# Patient Record
Sex: Male | Born: 1945 | Race: Black or African American | Hispanic: No | Marital: Married | Smoking: Former smoker
Health system: Southern US, Community
[De-identification: ages and names within clinical notes are randomized; demographics above are authoritative.]

## PROBLEM LIST (undated history)

## (undated) DIAGNOSIS — I10 Essential (primary) hypertension: Secondary | ICD-10-CM

## (undated) DIAGNOSIS — B1920 Unspecified viral hepatitis C without hepatic coma: Secondary | ICD-10-CM

## (undated) DIAGNOSIS — H409 Unspecified glaucoma: Secondary | ICD-10-CM

## (undated) DIAGNOSIS — H269 Unspecified cataract: Secondary | ICD-10-CM

## (undated) DIAGNOSIS — I639 Cerebral infarction, unspecified: Secondary | ICD-10-CM

## (undated) HISTORY — PX: EYE SURGERY: SHX253

## (undated) HISTORY — DX: Unspecified viral hepatitis C without hepatic coma: B19.20

## (undated) HISTORY — DX: Cerebral infarction, unspecified: I63.9

## (undated) HISTORY — DX: Essential (primary) hypertension: I10

## (undated) HISTORY — PX: CATARACT EXTRACTION, BILATERAL: SHX1313

## (undated) HISTORY — DX: Unspecified cataract: H26.9

---

## 2008-05-06 LAB — HM COLONOSCOPY: HM COLON: NORMAL

## 2011-07-30 ENCOUNTER — Other Ambulatory Visit (HOSPITAL_COMMUNITY): Payer: Self-pay | Admitting: Cardiology

## 2011-08-09 ENCOUNTER — Encounter (HOSPITAL_COMMUNITY): Payer: Self-pay

## 2011-08-09 ENCOUNTER — Ambulatory Visit (HOSPITAL_COMMUNITY)
Admission: RE | Admit: 2011-08-09 | Discharge: 2011-08-09 | Disposition: A | Discharge status: Home / Self Care | Payer: BC Managed Care – PPO | Source: Ambulatory Visit | Attending: Cardiology | Admitting: Cardiology

## 2011-08-09 ENCOUNTER — Encounter (HOSPITAL_COMMUNITY)
Admission: RE | Admit: 2011-08-09 | Discharge: 2011-08-09 | Disposition: A | Discharge status: Home / Self Care | Payer: BC Managed Care – PPO | Source: Ambulatory Visit | Attending: Cardiology | Admitting: Cardiology

## 2011-08-09 DIAGNOSIS — R079 Chest pain, unspecified: Secondary | ICD-10-CM | POA: Insufficient documentation

## 2011-08-09 DIAGNOSIS — R51 Headache: Secondary | ICD-10-CM | POA: Insufficient documentation

## 2011-08-09 DIAGNOSIS — I498 Other specified cardiac arrhythmias: Secondary | ICD-10-CM | POA: Insufficient documentation

## 2011-08-09 MED ORDER — REGADENOSON 0.4 MG/5ML IV SOLN
INTRAVENOUS | Status: AC
  Filled 2011-08-09: qty 5

## 2011-08-09 MED ORDER — TECHNETIUM TC 99M TETROFOSMIN IV KIT
30.0000 | PACK | Freq: Once | INTRAVENOUS | Status: AC | PRN
  Administered 2011-08-09: 30 via INTRAVENOUS

## 2011-08-09 MED ORDER — REGADENOSON 0.4 MG/5ML IV SOLN
0.4000 mg | Freq: Once | INTRAVENOUS | Status: AC
  Administered 2011-08-09: 0.4 mg via INTRAVENOUS

## 2011-08-09 MED ORDER — TECHNETIUM TC 99M TETROFOSMIN IV KIT: 10.0000 | PACK | Freq: Once | INTRAVENOUS | Status: DC | PRN

## 2011-08-09 MED ORDER — TECHNETIUM TC 99M TETROFOSMIN IV KIT
10.0000 | PACK | Freq: Once | INTRAVENOUS | Status: AC | PRN
  Administered 2011-08-09: 10 via INTRAVENOUS

## 2011-08-09 MED ORDER — TECHNETIUM TC 99M TETROFOSMIN IV KIT: 30.0000 | PACK | Freq: Once | INTRAVENOUS | Status: DC | PRN

## 2013-05-04 DIAGNOSIS — H4011X0 Primary open-angle glaucoma, stage unspecified: Secondary | ICD-10-CM | POA: Diagnosis not present

## 2013-05-04 DIAGNOSIS — H409 Unspecified glaucoma: Secondary | ICD-10-CM | POA: Diagnosis not present

## 2013-07-22 ENCOUNTER — Encounter: Payer: Self-pay | Admitting: Internal Medicine

## 2013-07-22 ENCOUNTER — Ambulatory Visit (INDEPENDENT_AMBULATORY_CARE_PROVIDER_SITE_OTHER): Payer: Medicare Other | Admitting: Internal Medicine

## 2013-07-22 VITALS — BP 130/74 | HR 71 | Temp 98.1°F | Ht 70.0 in | Wt 198.2 lb

## 2013-07-22 DIAGNOSIS — R5383 Other fatigue: Secondary | ICD-10-CM | POA: Diagnosis not present

## 2013-07-22 DIAGNOSIS — Z23 Encounter for immunization: Secondary | ICD-10-CM

## 2013-07-22 DIAGNOSIS — R6889 Other general symptoms and signs: Secondary | ICD-10-CM

## 2013-07-22 DIAGNOSIS — B182 Chronic viral hepatitis C: Secondary | ICD-10-CM | POA: Insufficient documentation

## 2013-07-22 DIAGNOSIS — R5381 Other malaise: Secondary | ICD-10-CM | POA: Diagnosis not present

## 2013-07-22 DIAGNOSIS — H9190 Unspecified hearing loss, unspecified ear: Secondary | ICD-10-CM | POA: Diagnosis not present

## 2013-07-22 DIAGNOSIS — B1920 Unspecified viral hepatitis C without hepatic coma: Secondary | ICD-10-CM | POA: Diagnosis not present

## 2013-07-22 LAB — CBC
HCT: 47.8 % (ref 39.0–52.0)
HEMOGLOBIN: 15.6 g/dL (ref 13.0–17.0)
MCHC: 32.8 g/dL (ref 30.0–36.0)
MCV: 93.7 fl (ref 78.0–100.0)
PLATELETS: 204 10*3/uL (ref 150.0–400.0)
RBC: 5.1 Mil/uL (ref 4.22–5.81)
RDW: 13.3 % (ref 11.5–14.6)
WBC: 2.9 10*3/uL — AB (ref 4.5–10.5)

## 2013-07-22 LAB — COMPREHENSIVE METABOLIC PANEL
ALT: 31 U/L (ref 0–53)
AST: 25 U/L (ref 0–37)
Albumin: 4.1 g/dL (ref 3.5–5.2)
Alkaline Phosphatase: 51 U/L (ref 39–117)
BILIRUBIN TOTAL: 0.8 mg/dL (ref 0.3–1.2)
BUN: 15 mg/dL (ref 6–23)
CALCIUM: 8.6 mg/dL (ref 8.4–10.5)
CHLORIDE: 103 meq/L (ref 96–112)
CO2: 25 meq/L (ref 19–32)
CREATININE: 1.1 mg/dL (ref 0.4–1.5)
GFR: 85.39 mL/min (ref >60.00)
Glucose, Bld: 88 mg/dL (ref 70–99)
Potassium: 3.9 mEq/L (ref 3.5–5.1)
Sodium: 137 mEq/L (ref 135–145)
Total Protein: 7 g/dL (ref 6.0–8.3)

## 2013-07-22 LAB — TSH: TSH: 0.73 u[IU]/mL (ref 0.35–5.50)

## 2013-07-22 NOTE — Patient Instructions (Addendum)

## 2013-07-22 NOTE — Progress Notes (Signed)
Pre visit review using our clinic review tool, if applicable. No additional management support is needed unless otherwise documented below in the visit note. 

## 2013-07-22 NOTE — Addendum Note (Signed)
Addended by: Roena MaladyEVONTENNO, Malesha Suliman Y on: 07/22/2013 04:51 PM   Modules accepted: Orders

## 2013-07-22 NOTE — Progress Notes (Signed)
HPI  Pt presents to the clinic today to establish care. He is transferring care from Dr. Larwance Sachs at [facility].  He does feel cold at times. He is not sure if it is a thyroid issue of anemia. He does feel a little fatigue but denies all other s/s of thyroid disease. He feels like he is having trouble hearing out of his ears. He is not sure if it is wax buildup or hearing loss. He has not had any trauma to the head.  Flu: never Tetanus: 2014 Pneumovax:  Zostovax: Colon Screening: 2010 PSA Screening: 2014 Eye Doctor: yearly Dentist: biannually  Past Medical History  Diagnosis Date  . Hepatitis C     Current Outpatient Prescriptions  Medication Sig Dispense Refill  . bimatoprost (LUMIGAN) 0.01 % SOLN Place 1 drop into both eyes at bedtime.       No current facility-administered medications for this visit.    No Known Allergies  Family History  Problem Relation Age of Onset  . Hypertension Mother   . Hypertension Father     History   Social History  . Marital Status: Married    Spouse Name: N/A    Number of Children: N/A  . Years of Education: N/A   Occupational History  . Not on file.   Social History Main Topics  . Smoking status: Former Games developer  . Smokeless tobacco: Former Neurosurgeon    Quit date: 05/06/1993  . Alcohol Use: No  . Drug Use: No  . Sexual Activity: Not on file   Other Topics Concern  . Not on file   Social History Narrative  . No narrative on file    ROS:  Constitutional: Pt reports fatigue. Denies fever, malaise,  headache or abrupt weight changes.  HEENT: Pt reports difficulty hearing. Denies eye pain, eye redness, ear pain, ringing in the ears, wax buildup, runny nose, nasal congestion, bloody nose, or sore throat. Respiratory: Denies difficulty breathing, shortness of breath, cough or sputum production.   Cardiovascular: Denies chest pain, chest tightness, palpitations or swelling in the hands or feet.  Gastrointestinal: Denies  abdominal pain, bloating, constipation, diarrhea or blood in the stool.  GU: Denies frequency, urgency, pain with urination, blood in urine, odor or discharge. Musculoskeletal: Denies decrease in range of motion, difficulty with gait, muscle pain or joint pain and swelling.  Skin: Denies redness, rashes, lesions or ulcercations.  Neurological: Denies dizziness, difficulty with memory, difficulty with speech or problems with balance and coordination.   No other specific complaints in a complete review of systems (except as listed in HPI above).  PE:  Ht 5\' 10"  (1.778 m)  Wt 198 lb 4 oz (89.926 kg)  BMI 28.45 kg/m2  SpO2 97% Wt Readings from Last 3 Encounters:  07/22/13 198 lb 4 oz (89.926 kg)    General: Appears his stated age, well developed, well nourished in NAD. HEENT: Head: normal shape and size; Eyes: sclera white, no icterus, conjunctiva pink, PERRLA and EOMs intact; Ears: Tm's gray and intact, normal light reflex; Nose: mucosa pink and moist, septum midline; Throat/Mouth: Teeth present, mucosa pink and moist, no lesions or ulcerations noted.  Neck: Normal range of motion. Neck supple, trachea midline. No massses, lumps present. Thyromegaly noted. Cardiovascular: Normal rate and rhythm. S1,S2 noted.  No murmur, rubs or gallops noted. No JVD or BLE edema. No carotid bruits noted. Pulmonary/Chest: Normal effort and positive vesicular breath sounds. No respiratory distress. No wheezes, rales or ronchi noted.  Abdomen: Soft and  nontender. Normal bowel sounds, no bruits noted. No distention or masses noted. Liver, spleen and kidneys non palpable. Musculoskeletal: Normal range of motion. No signs of joint swelling. No difficulty with gait.  Neurological: Alert and oriented. Cranial nerves II-XII intact. Coordination normal. +DTRs bilaterally. Psychiatric: Mood and affect normal. Behavior is normal. Judgment and thought content normal.      Assessment and Plan:  Cold feeling,  fatigue:  Will check basic labs today including TSH  Difficulty Hearing:  Not due to cerumen impaction If continues to bother you, will refer to Audiology  RTC in 1 year or sooner if needed

## 2013-07-22 NOTE — Assessment & Plan Note (Signed)
Will start treatment with GI in may

## 2013-07-23 ENCOUNTER — Other Ambulatory Visit: Payer: Self-pay | Admitting: Internal Medicine

## 2013-07-23 DIAGNOSIS — D709 Neutropenia, unspecified: Secondary | ICD-10-CM

## 2013-09-07 DIAGNOSIS — H409 Unspecified glaucoma: Secondary | ICD-10-CM | POA: Diagnosis not present

## 2013-09-07 DIAGNOSIS — H4011X0 Primary open-angle glaucoma, stage unspecified: Secondary | ICD-10-CM | POA: Diagnosis not present

## 2013-09-15 DIAGNOSIS — B1920 Unspecified viral hepatitis C without hepatic coma: Secondary | ICD-10-CM | POA: Diagnosis not present

## 2013-11-04 DIAGNOSIS — B182 Chronic viral hepatitis C: Secondary | ICD-10-CM | POA: Diagnosis not present

## 2013-12-30 DIAGNOSIS — B182 Chronic viral hepatitis C: Secondary | ICD-10-CM | POA: Diagnosis not present

## 2013-12-30 DIAGNOSIS — Q619 Cystic kidney disease, unspecified: Secondary | ICD-10-CM | POA: Diagnosis not present

## 2014-01-14 DIAGNOSIS — H4011X0 Primary open-angle glaucoma, stage unspecified: Secondary | ICD-10-CM | POA: Diagnosis not present

## 2014-01-14 DIAGNOSIS — H409 Unspecified glaucoma: Secondary | ICD-10-CM | POA: Diagnosis not present

## 2014-01-20 DIAGNOSIS — B182 Chronic viral hepatitis C: Secondary | ICD-10-CM | POA: Diagnosis not present

## 2014-02-16 ENCOUNTER — Other Ambulatory Visit: Payer: Self-pay | Admitting: Internal Medicine

## 2014-02-16 ENCOUNTER — Telehealth: Payer: Self-pay | Admitting: Internal Medicine

## 2014-02-16 MED ORDER — ZOSTER VACCINE LIVE 19400 UNT/0.65ML ~~LOC~~ SOLR: 0.6500 mL | Freq: Once | SUBCUTANEOUS | Status: DC

## 2014-02-16 NOTE — Telephone Encounter (Signed)
Refer to msg below--please advise

## 2014-02-16 NOTE — Telephone Encounter (Signed)
Pt is requesting prescription for the shingles vaccine. His insurance does not cover it through our office, he will need to take that prescription to the pharmacy. Please call pt when ready. Thanks

## 2014-02-16 NOTE — Telephone Encounter (Signed)
Rx left in front office for pick up and pt is aware  

## 2014-02-16 NOTE — Telephone Encounter (Signed)
RX printed and signed, given to Pulte Homesmelanie

## 2014-03-24 DIAGNOSIS — B182 Chronic viral hepatitis C: Secondary | ICD-10-CM | POA: Diagnosis not present

## 2014-03-24 DIAGNOSIS — Z79899 Other long term (current) drug therapy: Secondary | ICD-10-CM | POA: Diagnosis not present

## 2014-05-17 DIAGNOSIS — H4011X2 Primary open-angle glaucoma, moderate stage: Secondary | ICD-10-CM | POA: Diagnosis not present

## 2014-09-14 DIAGNOSIS — Z6829 Body mass index (BMI) 29.0-29.9, adult: Secondary | ICD-10-CM | POA: Diagnosis not present

## 2014-09-14 DIAGNOSIS — B182 Chronic viral hepatitis C: Secondary | ICD-10-CM | POA: Diagnosis not present

## 2014-09-14 DIAGNOSIS — Z79899 Other long term (current) drug therapy: Secondary | ICD-10-CM | POA: Diagnosis not present

## 2014-09-20 DIAGNOSIS — H4011X2 Primary open-angle glaucoma, moderate stage: Secondary | ICD-10-CM | POA: Diagnosis not present

## 2014-10-27 ENCOUNTER — Ambulatory Visit (INDEPENDENT_AMBULATORY_CARE_PROVIDER_SITE_OTHER): Payer: Medicare Other | Admitting: Internal Medicine

## 2014-10-27 ENCOUNTER — Encounter: Payer: Self-pay | Admitting: Internal Medicine

## 2014-10-27 VITALS — BP 136/84 | HR 57 | Temp 98.1°F | Ht 70.0 in | Wt 200.0 lb

## 2014-10-27 DIAGNOSIS — Z0000 Encounter for general adult medical examination without abnormal findings: Secondary | ICD-10-CM

## 2014-10-27 DIAGNOSIS — Z125 Encounter for screening for malignant neoplasm of prostate: Secondary | ICD-10-CM | POA: Diagnosis not present

## 2014-10-27 DIAGNOSIS — Z23 Encounter for immunization: Secondary | ICD-10-CM

## 2014-10-27 DIAGNOSIS — Z136 Encounter for screening for cardiovascular disorders: Secondary | ICD-10-CM

## 2014-10-27 NOTE — Progress Notes (Signed)
HPI:  Pt presents to the clinic to the clinic today for his Medicare Wellness Exam.  Past Medical History  Diagnosis Date  . Hepatitis C     Current Outpatient Prescriptions  Medication Sig Dispense Refill  . bimatoprost (LUMIGAN) 0.01 % SOLN Place 1 drop into both eyes at bedtime.     No current facility-administered medications for this visit.    No Known Allergies  Family History  Problem Relation Age of Onset  . Hypertension Mother   . Hypertension Father     History   Social History  . Marital Status: Married    Spouse Name: N/A  . Number of Children: N/A  . Years of Education: N/A   Occupational History  . Not on file.   Social History Main Topics  . Smoking status: Former Games developer  . Smokeless tobacco: Former Neurosurgeon    Quit date: 05/06/1993  . Alcohol Use: No  . Drug Use: No  . Sexual Activity: Not on file   Other Topics Concern  . Not on file   Social History Narrative    Hospitiliaztions: None  Health Maintenance:    Flu: never  Tetanus: 05/2012  Pneumovax: never  Prevnar: 07/2013  Zostavax: 02/2014  PSA Screening: unsure  Colonoscopy: 2009 in [place] (10 years)  Eye Doctor: 3-4 x per year  Dental Exam: biannually  Providers:   PCP: Nicki Reaper, NP-C  Hepatologist: Dr. Foy Guadalajara at [facility]  I have personally reviewed and have noted:  1. The patient's medical and social history 2. Their use of alcohol, tobacco or illicit drugs 3. Their current medications and supplements 4. The patient's functional ability including ADL's, fall risks, home safety  risks and hearing or visual impairment. 5. Diet and physical activities 6. Evidence for depression or mood disorder  Subjective:   Review of Systems:   Constitutional: Denies fever, malaise, fatigue, headache or abrupt weight changes.  HEENT: Denies eye pain, eye redness, ear pain, ringing in the ears, wax buildup, runny nose, nasal congestion, bloody nose, or sore throat. Respiratory: Denies  difficulty breathing, shortness of breath, cough or sputum production.   Cardiovascular: Denies chest pain, chest tightness, palpitations or swelling in the hands or feet.  Gastrointestinal: Denies abdominal pain, bloating, constipation, diarrhea or blood in the stool.  GU: Denies urgency, frequency, pain with urination, burning sensation, blood in urine, odor or discharge. Musculoskeletal: Denies decrease in range of motion, difficulty with gait, muscle pain or joint pain and swelling.  Skin: Denies redness, rashes, lesions or ulcercations.  Neurological: Denies dizziness, difficulty with memory, difficulty with speech or problems with balance and coordination.  Psych: Denies anxiety, depression, SI/HI.  No other specific complaints in a complete review of systems (except as listed in HPI above).  Objective:  PE:   BP 136/84 mmHg  Pulse 57  Temp(Src) 98.1 F (36.7 C) (Oral)  Ht  (1.778 m)  Wt 200 lb (90.719 kg)  BMI 28.70 kg/m2  SpO2 98% Wt Readings from Last 3 Encounters:  10/27/14 200 lb (90.719 kg)  07/22/13 198 lb 4 oz (89.926 kg)    General: Appears his stated age, well developed, well nourished in NAD. Cardiovascular: Normal rate and rhythm. S1,S2 noted.  No murmur, rubs or gallops noted. No JVD or BLE edema. No carotid bruits noted. Pulmonary/Chest: Normal effort and positive vesicular breath sounds. No respiratory distress. No wheezes, rales or ronchi noted.  Neurological: Alert and oriented. Recall, short term and long term memory intact. Psychiatric: Mood and  affect normal. Behavior is normal. Judgment and thought content normal.     BMET    Component Value Date/Time   NA 137 07/22/2013 1404   K 3.9 07/22/2013 1404   CL 103 07/22/2013 1404   CO2 25 07/22/2013 1404   GLUCOSE 88 07/22/2013 1404   BUN 15 07/22/2013 1404   CREATININE 1.1 07/22/2013 1404   CALCIUM 8.6 07/22/2013 1404    Lipid Panel  No results found for: CHOL, TRIG, HDL, CHOLHDL, VLDL,  LDLCALC  CBC    Component Value Date/Time   WBC 2.9* 07/22/2013 1404   RBC 5.10 07/22/2013 1404   HGB 15.6 07/22/2013 1404   HCT 47.8 07/22/2013 1404   PLT 204.0 07/22/2013 1404   MCV 93.7 07/22/2013 1404   MCHC 32.8 07/22/2013 1404   RDW 13.3 07/22/2013 1404    Hgb A1C No results found for: HGBA1C    Assessment and Plan:   Medicare Annual Wellness Visit:  Diet: He tries to avoid fried and fatty foods. He avoids red meat. Physical activity: He goes to the gym 3 x week. Depression/mood screen: Negative Hearing: Intact to whispered voice Visual acuity: Grossly normal, performs annual eye exam  ADLs: Capable Fall risk: None Home safety: Good Cognitive evaluation: Intact to orientation, naming, recall and repetition EOL planning: No adv directives, full code/ I agree  Preventative Medicine:  Pneumovax today. He declines flu shot. Will check PSA and Lipid profile today  Next appointment:  1 year for annual exam

## 2014-10-27 NOTE — Progress Notes (Signed)
Pre visit review using our clinic review tool, if applicable. No additional management support is needed unless otherwise documented below in the visit note. 

## 2014-10-27 NOTE — Patient Instructions (Signed)

## 2014-10-27 NOTE — Addendum Note (Signed)
Addended by: Roena Malady on: 10/27/2014 04:48 PM   Modules accepted: Orders

## 2014-10-28 LAB — LIPID PANEL
CHOL/HDL RATIO: 3
Cholesterol: 202 mg/dL — ABNORMAL HIGH (ref 0–200)
HDL: 59.3 mg/dL (ref >39.00)
LDL Cholesterol: 128 mg/dL — ABNORMAL HIGH (ref 0–99)
NONHDL: 142.7
Triglycerides: 74 mg/dL (ref 0.0–149.0)
VLDL: 14.8 mg/dL (ref 0.0–40.0)

## 2014-10-28 LAB — PSA, MEDICARE: PSA: 0.31 ng/mL (ref 0.10–4.00)

## 2014-11-24 ENCOUNTER — Ambulatory Visit (INDEPENDENT_AMBULATORY_CARE_PROVIDER_SITE_OTHER): Payer: Medicare Other | Admitting: Primary Care

## 2014-11-24 ENCOUNTER — Encounter: Payer: Self-pay | Admitting: Primary Care

## 2014-11-24 VITALS — BP 140/82 | HR 66 | Temp 97.6°F | Ht 70.0 in | Wt 203.4 lb

## 2014-11-24 DIAGNOSIS — L03115 Cellulitis of right lower limb: Secondary | ICD-10-CM

## 2014-11-24 MED ORDER — DOXYCYCLINE HYCLATE 100 MG PO TABS: 100.0000 mg | ORAL_TABLET | Freq: Two times a day (BID) | ORAL | Status: DC

## 2014-11-24 NOTE — Patient Instructions (Signed)
Start Doxycycline antibiotics for skin infection. Take 1 tablet by mouth twice daily for 7 days.  Keep wound clean and dry.  Call me if no resolve in 7 days. It was nice to meet you!

## 2014-11-24 NOTE — Progress Notes (Signed)
   Subjective:    Patient ID: Jason Nolan, male    DOB: Sep 07, 1945, 69 y.o.   MRN: [id number]  HPI  [name] is a 69 year old male who presents today with a chief complaint of wound to his right lower extremity. The sore has been present for the past 10 days and is located to the right medial ankle. He was playing golf and accidentally hit his right ankle when transferring his golf club from one hand to the other. Denies pain when walking but will experience intermittent sharp pain. Denies drainage, bleeding, fevers, chills, calf pain. Overall his sore has not healed and is about the same.  Review of Systems  Constitutional: Negative for fever and chills.  Respiratory: Negative for shortness of breath.   Cardiovascular: Negative for chest pain.  Musculoskeletal: Negative for myalgias.  Skin: Positive for color change and wound.       Past Medical History  Diagnosis Date  . Hepatitis C     History   Social History  . Marital Status: Married    Spouse Name: N/A  . Number of Children: N/A  . Years of Education: N/A   Occupational History  . Not on file.   Social History Main Topics  . Smoking status: Former Games developer  . Smokeless tobacco: Former Neurosurgeon    Quit date: 05/06/1993  . Alcohol Use: No  . Drug Use: No  . Sexual Activity: Not on file   Other Topics Concern  . Not on file   Social History Narrative    No past surgical history on file.  Family History  Problem Relation Age of Onset  . Hypertension Mother   . Hypertension Father     No Known Allergies  Current Outpatient Prescriptions on File Prior to Visit  Medication Sig Dispense Refill  . bimatoprost (LUMIGAN) 0.01 % SOLN Place 1 drop into both eyes at bedtime.     No current facility-administered medications on file prior to visit.    BP 140/82 mmHg  Pulse 66  Temp(Src) 97.6 F (36.4 C) (Oral)  Ht  (1.778 m)  Wt 203 lb 6.4 oz (92.262 kg)  BMI 29.18 kg/m2  SpO2 97%    Objective:     Physical Exam  Constitutional: He appears well-nourished.  Cardiovascular: Normal rate and regular rhythm.   Pulmonary/Chest: Effort normal and breath sounds normal.  Skin: Skin is warm.  4 cm area of swelling surrounding an 1/2 cm open wound with some scabbing. Redness and swelling present. Tender upon palpation.          Assessment & Plan:  Cellulitis:  Small open wound to right medial ankle with swelling and redness. Present for 10 days without healing. RX for Doxycycline BID x 7 days. Follow up if no improvement in 3-4 days.

## 2014-12-06 ENCOUNTER — Telehealth: Payer: Self-pay

## 2014-12-06 NOTE — Telephone Encounter (Signed)
Pt was seen 11/24/14 with wound to rt ankle; pt continues with same amt of swelling but redness is gone. No pain and no fever. Pt finished abx on 12/02/14 and pt wants to know what to do about swelling still being in ankle. [facility]. Pt request cb.

## 2014-12-06 NOTE — Telephone Encounter (Signed)
He can try icing his ankle for 20 minute intervals three times daily, wearing an ankle brace for support during the day, and taking ibuprofen 600 mg three times daily as needed for inflammation. Please have him follow up with PCP or myself if no improvement after trying those remedies.

## 2014-12-06 NOTE — Telephone Encounter (Signed)
Called and notified patient of Kate's comments. Patient verbalized understanding.  

## 2015-01-27 DIAGNOSIS — H4011X3 Primary open-angle glaucoma, severe stage: Secondary | ICD-10-CM | POA: Diagnosis not present

## 2015-02-24 DIAGNOSIS — H401122 Primary open-angle glaucoma, left eye, moderate stage: Secondary | ICD-10-CM | POA: Diagnosis not present

## 2015-02-24 DIAGNOSIS — H401113 Primary open-angle glaucoma, right eye, severe stage: Secondary | ICD-10-CM | POA: Diagnosis not present

## 2015-06-27 DIAGNOSIS — H52221 Regular astigmatism, right eye: Secondary | ICD-10-CM | POA: Diagnosis not present

## 2015-06-27 DIAGNOSIS — H524 Presbyopia: Secondary | ICD-10-CM | POA: Diagnosis not present

## 2015-06-27 DIAGNOSIS — H5201 Hypermetropia, right eye: Secondary | ICD-10-CM | POA: Diagnosis not present

## 2015-06-27 DIAGNOSIS — H401122 Primary open-angle glaucoma, left eye, moderate stage: Secondary | ICD-10-CM | POA: Diagnosis not present

## 2015-06-27 DIAGNOSIS — H401113 Primary open-angle glaucoma, right eye, severe stage: Secondary | ICD-10-CM | POA: Diagnosis not present

## 2015-06-27 DIAGNOSIS — H5202 Hypermetropia, left eye: Secondary | ICD-10-CM | POA: Diagnosis not present

## 2015-06-27 DIAGNOSIS — H43813 Vitreous degeneration, bilateral: Secondary | ICD-10-CM | POA: Diagnosis not present

## 2015-06-27 DIAGNOSIS — H25813 Combined forms of age-related cataract, bilateral: Secondary | ICD-10-CM | POA: Diagnosis not present

## 2015-07-28 DIAGNOSIS — H401122 Primary open-angle glaucoma, left eye, moderate stage: Secondary | ICD-10-CM | POA: Diagnosis not present

## 2015-07-28 DIAGNOSIS — H401113 Primary open-angle glaucoma, right eye, severe stage: Secondary | ICD-10-CM | POA: Diagnosis not present

## 2015-08-29 DIAGNOSIS — H401122 Primary open-angle glaucoma, left eye, moderate stage: Secondary | ICD-10-CM | POA: Diagnosis not present

## 2015-08-29 DIAGNOSIS — H401113 Primary open-angle glaucoma, right eye, severe stage: Secondary | ICD-10-CM | POA: Diagnosis not present

## 2015-10-24 ENCOUNTER — Ambulatory Visit (INDEPENDENT_AMBULATORY_CARE_PROVIDER_SITE_OTHER): Payer: Medicare Other | Admitting: Internal Medicine

## 2015-10-24 ENCOUNTER — Encounter: Payer: Self-pay | Admitting: Internal Medicine

## 2015-10-24 VITALS — BP 128/82 | HR 54 | Temp 98.7°F | Wt 206.0 lb

## 2015-10-24 DIAGNOSIS — M545 Low back pain, unspecified: Secondary | ICD-10-CM

## 2015-10-24 DIAGNOSIS — T148XXA Other injury of unspecified body region, initial encounter: Secondary | ICD-10-CM

## 2015-10-24 DIAGNOSIS — T148 Other injury of unspecified body region: Secondary | ICD-10-CM

## 2015-10-24 NOTE — Patient Instructions (Signed)

## 2015-10-24 NOTE — Progress Notes (Signed)
Subjective:    Patient ID: Jason Nolan, male    DOB: 1945-10-27, 70 y.o.   MRN: [id number]  HPI  Pt presents to the clinic today with c/o left lower back pain. He reports this started 10 days ago. He describes the pain as tightness and pressure. The pain does not radiate. He denies numbness and tingling in his legs. He denies loss of bowel or bladder. He denies any injury to the area. He denies urinary symptoms. He has tried Aleve once without any relief.  Review of Systems      Past Medical History  Diagnosis Date  . Hepatitis C     Current Outpatient Prescriptions  Medication Sig Dispense Refill  . ALPHAGAN P 0.1 % SOLN INSTILL 1 DROP IN RIGHT EYE TWICE A DAY  0  . bimatoprost (LUMIGAN) 0.01 % SOLN Place 1 drop into both eyes at bedtime.    . timolol (BETIMOL) 0.5 % ophthalmic solution Place 1 drop into the right eye daily.     No current facility-administered medications for this visit.    No Known Allergies  Family History  Problem Relation Age of Onset  . Hypertension Mother   . Hypertension Father     Social History   Social History  . Marital Status: Married    Spouse Name: N/A  . Number of Children: N/A  . Years of Education: N/A   Occupational History  . Not on file.   Social History Main Topics  . Smoking status: Former Games developermoker  . Smokeless tobacco: Former NeurosurgeonUser    Quit date: 05/06/1993  . Alcohol Use: No  . Drug Use: No  . Sexual Activity: Not on file   Other Topics Concern  . Not on file   Social History Narrative     Constitutional: Denies fever, malaise, fatigue, headache or abrupt weight changes.  Gastrointestinal: Denies abdominal pain, bloating, constipation, diarrhea or blood in the stool.  GU: Denies urgency, frequency, pain with urination, burning sensation, blood in urine, odor or discharge. Musculoskeletal: Pt reports back pain. Denies decrease in range of motion, difficulty with gait, or joint pain and swelling.  Neurological:  Denies dizziness, difficulty with memory, difficulty with speech or problems with balance and coordination.    No other specific complaints in a complete review of systems (except as listed in HPI above).  Objective:   Physical Exam   BP 128/82 mmHg  Pulse 54  Temp(Src) 98.7 F (37.1 C) (Oral)  Wt 206 lb (93.441 kg)  SpO2 98% Wt Readings from Last 3 Encounters:  10/24/15 206 lb (93.441 kg)  11/24/14 203 lb 6.4 oz (92.262 kg)  10/27/14 200 lb (90.719 kg)    General: Appears his stated age, well developed, well nourished in NAD. Abdomen: Soft and nontender. Normal bowel sounds No distention or masses noted. No CVA tenderness noted. Musculoskeletal: Normal flexion, extension and rotation of the spine. No bony tenderness noted over the spine. Pain with palpation of the left paralumbar muscles. No difficulty with gait.  Neurological: Alert and oriented.    BMET    Component Value Date/Time   NA 137 07/22/2013 1404   K 3.9 07/22/2013 1404   CL 103 07/22/2013 1404   CO2 25 07/22/2013 1404   GLUCOSE 88 07/22/2013 1404   BUN 15 07/22/2013 1404   CREATININE 1.1 07/22/2013 1404   CALCIUM 8.6 07/22/2013 1404    Lipid Panel     Component Value Date/Time   CHOL 202* 10/27/2014 1531  TRIG 74.0 10/27/2014 1531   HDL 59.30 10/27/2014 1531   CHOLHDL 3 10/27/2014 1531   VLDL 14.8 10/27/2014 1531   LDLCALC 128* 10/27/2014 1531    CBC    Component Value Date/Time   WBC 2.9* 07/22/2013 1404   RBC 5.10 07/22/2013 1404   HGB 15.6 07/22/2013 1404   HCT 47.8 07/22/2013 1404   PLT 204.0 07/22/2013 1404   MCV 93.7 07/22/2013 1404   MCHC 32.8 07/22/2013 1404   RDW 13.3 07/22/2013 1404    Hgb A1C No results found for: HGBA1C      Assessment & Plan:   Muscle strain of left lower back:  No red flags on exam Advised him to take Ibuprofen 600 mg every 8 hours instead of Aleve Stretching exercises given A heating pad may be helpful He declines RX for muscle relaxer at this  time  RTC as needed or if symptoms persist or worsen BAITY, REGINA, NP

## 2015-10-24 NOTE — Progress Notes (Signed)
Pre visit review using our clinic review tool, if applicable. No additional management support is needed unless otherwise documented below in the visit note. 

## 2015-12-06 DIAGNOSIS — Z6829 Body mass index (BMI) 29.0-29.9, adult: Secondary | ICD-10-CM | POA: Diagnosis not present

## 2015-12-06 DIAGNOSIS — Z8619 Personal history of other infectious and parasitic diseases: Secondary | ICD-10-CM | POA: Diagnosis not present

## 2015-12-06 DIAGNOSIS — K740 Hepatic fibrosis: Secondary | ICD-10-CM | POA: Diagnosis not present

## 2016-01-01 ENCOUNTER — Other Ambulatory Visit: Payer: Self-pay

## 2016-01-16 DIAGNOSIS — H401122 Primary open-angle glaucoma, left eye, moderate stage: Secondary | ICD-10-CM | POA: Diagnosis not present

## 2016-01-16 DIAGNOSIS — H401113 Primary open-angle glaucoma, right eye, severe stage: Secondary | ICD-10-CM | POA: Diagnosis not present

## 2016-01-26 ENCOUNTER — Ambulatory Visit (INDEPENDENT_AMBULATORY_CARE_PROVIDER_SITE_OTHER): Payer: Medicare Other | Admitting: Internal Medicine

## 2016-01-26 ENCOUNTER — Encounter: Payer: Self-pay | Admitting: Internal Medicine

## 2016-01-26 VITALS — BP 140/92 | HR 44 | Temp 98.1°F | Ht 69.75 in | Wt 205.5 lb

## 2016-01-26 DIAGNOSIS — Z136 Encounter for screening for cardiovascular disorders: Secondary | ICD-10-CM | POA: Diagnosis not present

## 2016-01-26 DIAGNOSIS — Z125 Encounter for screening for malignant neoplasm of prostate: Secondary | ICD-10-CM

## 2016-01-26 DIAGNOSIS — Z0000 Encounter for general adult medical examination without abnormal findings: Secondary | ICD-10-CM | POA: Diagnosis not present

## 2016-01-26 LAB — CBC
HEMATOCRIT: 45.9 % (ref 39.0–52.0)
HEMOGLOBIN: 15.6 g/dL (ref 13.0–17.0)
MCHC: 34.1 g/dL (ref 30.0–36.0)
MCV: 90.9 fl (ref 78.0–100.0)
Platelets: 183 10*3/uL (ref 150.0–400.0)
RBC: 5.05 Mil/uL (ref 4.22–5.81)
RDW: 14 % (ref 11.5–15.5)

## 2016-01-26 LAB — LIPID PANEL
CHOLESTEROL: 211 mg/dL — AB (ref 0–200)
HDL: 63.3 mg/dL (ref >39.00)
LDL CALC: 134 mg/dL — AB (ref 0–99)
NonHDL: 147.25
Total CHOL/HDL Ratio: 3
Triglycerides: 68 mg/dL (ref 0.0–149.0)
VLDL: 13.6 mg/dL (ref 0.0–40.0)

## 2016-01-26 LAB — COMPREHENSIVE METABOLIC PANEL
ALBUMIN: 4.2 g/dL (ref 3.5–5.2)
ALK PHOS: 52 U/L (ref 39–117)
ALT: 20 U/L (ref 0–53)
AST: 18 U/L (ref 0–37)
BUN: 16 mg/dL (ref 6–23)
CHLORIDE: 103 meq/L (ref 96–112)
CO2: 29 mEq/L (ref 19–32)
Calcium: 8.9 mg/dL (ref 8.4–10.5)
Creatinine, Ser: 1.04 mg/dL (ref 0.40–1.50)
GFR: 91.35 mL/min (ref >60.00)
Glucose, Bld: 77 mg/dL (ref 70–99)
POTASSIUM: 4 meq/L (ref 3.5–5.1)
Sodium: 139 mEq/L (ref 135–145)
TOTAL PROTEIN: 7.2 g/dL (ref 6.0–8.3)
Total Bilirubin: 0.8 mg/dL (ref 0.2–1.2)

## 2016-01-26 LAB — PSA, MEDICARE: PSA: 0.33 ng/ml (ref 0.10–4.00)

## 2016-01-26 NOTE — Progress Notes (Signed)
Pre visit review using our clinic review tool, if applicable. No additional management support is needed unless otherwise documented below in the visit note. 

## 2016-01-26 NOTE — Patient Instructions (Signed)

## 2016-01-26 NOTE — Progress Notes (Signed)
HPI:  Pt presents to the clinic to the clinic today for his Medicare Wellness Exam.  Past Medical History:  Diagnosis Date  . Hepatitis C     Current Outpatient Prescriptions  Medication Sig Dispense Refill  . ALPHAGAN P 0.1 % SOLN INSTILL 1 DROP IN RIGHT EYE TWICE A DAY  0  . bimatoprost (LUMIGAN) 0.01 % SOLN Place 1 drop into both eyes at bedtime.    . timolol (BETIMOL) 0.5 % ophthalmic solution Place 1 drop into the right eye daily.     No current facility-administered medications for this visit.     No Known Allergies  Family History  Problem Relation Age of Onset  . Hypertension Mother   . Hypertension Father     Social History   Social History  . Marital status: Married    Spouse name: N/A  . Number of children: N/A  . Years of education: N/A   Occupational History  . Not on file.   Social History Main Topics  . Smoking status: Former Games developermoker  . Smokeless tobacco: Former NeurosurgeonUser    Quit date: 05/06/1993  . Alcohol use No  . Drug use: No  . Sexual activity: Not on file   Other Topics Concern  . Not on file   Social History Narrative  . No narrative on file    Hospitiliaztions: None  Health Maintenance:    Flu: never  Tetanus: 05/2012  Pneumovax: 10/2014  Prevnar: 07/2013  Zostavax: 02/2014  PSA Screening: 10/2014  [place]: [place] in [place] (10 years)  Eye Doctor: 3-4 x per year  Dental Exam: biannually  Providers:   PCP: Nicki Reaperegina Yussef Jorge, NP-C    I have personally reviewed and have noted:  1. The patient's medical and social history 2. Their use of alcohol, tobacco or illicit drugs 3. Their current medications and supplements 4. The patient's functional ability including ADL's, fall risks, home safety  risks and hearing or visual impairment. 5. Diet and physical activities 6. Evidence for depression or mood disorder  Subjective:   Review of Systems:   Constitutional: Denies fever, malaise, fatigue, headache or abrupt weight changes.  HEENT: Pt  reports ringing in his ears. Denies eye pain, eye redness, ear pain, wax buildup, runny nose, nasal congestion, bloody nose, or sore throat. Respiratory: Denies difficulty breathing, shortness of breath, cough or sputum production.   Cardiovascular: Denies chest pain, chest tightness, palpitations or swelling in the hands or feet.  Gastrointestinal: Denies abdominal pain, bloating, constipation, diarrhea or blood in the stool.  GU: Denies urgency, frequency, pain with urination, burning sensation, blood in urine, odor or discharge. Musculoskeletal: Pt reports neck pain. Denies decrease in range of motion, difficulty with gait, or joint swelling.  Skin: Denies redness, rashes, lesions or ulcercations.  Neurological: Denies dizziness, difficulty with memory, difficulty with speech or problems with balance and coordination.  Psych: Denies anxiety, depression, SI/HI.  No other specific complaints in a complete review of systems (except as listed in HPI above).  Objective:  PE:  BP (!) 140/92 (BP Location: Right Arm, Patient Position: Sitting)   Pulse (!) 44   Temp 98.1 F (36.7 C) (Oral)   Ht 5' 9.75" (1.772 m)   Wt 205 lb 8 oz (93.2 kg)   SpO2 99%   BMI 29.70 kg/m   Wt Readings from Last 3 Encounters:  10/24/15 206 lb (93.4 kg)  11/24/14 203 lb 6.4 oz (92.3 kg)  10/27/14 200 lb (90.7 kg)    General: Appears  his stated age, well developed, well nourished in NAD. HEENT: Ears: TM's grey and intact, normal light reflex. Cardiovascular: Bradycardic with normal rhythm. S1,S2 noted.  No murmur, rubs or gallops noted. No JVD or BLE edema. No carotid bruits noted. Pulmonary/Chest: Normal effort and positive vesicular breath sounds. No respiratory distress. No wheezes, rales or ronchi noted.  MSK: Normal flexion, extension, rotation and lateral bending of the neck. No bony tenderness noted over the spine. Tenderness noted with palpation of the right sternocleidomastoid.  Neurological: Alert  and oriented. Recall, short term and long term memory intact. Psychiatric: Mood and affect normal. Behavior is normal. Judgment and thought content normal.     BMET    Component Value Date/Time   NA 137 07/22/2013 1404   K 3.9 07/22/2013 1404   CL 103 07/22/2013 1404   CO2 25 07/22/2013 1404   GLUCOSE 88 07/22/2013 1404   BUN 15 07/22/2013 1404   CREATININE 1.1 07/22/2013 1404   CALCIUM 8.6 07/22/2013 1404    Lipid Panel     Component Value Date/Time   CHOL 202 (H) 10/27/2014 1531   TRIG 74.0 10/27/2014 1531   HDL 59.30 10/27/2014 1531   CHOLHDL 3 10/27/2014 1531   VLDL 14.8 10/27/2014 1531   LDLCALC 128 (H) 10/27/2014 1531    CBC    Component Value Date/Time   WBC 2.9 (L) 07/22/2013 1404   RBC 5.10 07/22/2013 1404   HGB 15.6 07/22/2013 1404   HCT 47.8 07/22/2013 1404   PLT 204.0 07/22/2013 1404   MCV 93.7 07/22/2013 1404   MCHC 32.8 07/22/2013 1404   RDW 13.3 07/22/2013 1404    Hgb A1C No results found for: HGBA1C    Assessment and Plan:   Medicare Annual Wellness Visit:  Diet: He tries to avoid fried and fatty foods. He avoids red meat. Physical activity: He goes to the gym 2-3 x week. He also to play golf in the summer. Depression/mood screen: Negative Hearing: Intact to whispered voice Visual acuity: Grossly normal, performs annual eye exam  ADLs: Capable Fall risk: None Home safety: Good Cognitive evaluation: Intact to orientation, naming, recall and repetition EOL planning: No adv directives, full code/ I agree  Preventative Medicine:  He declines flu shot today. Tetanus, Pneumovax, Prevnar and Zostovax UTD. Colonoscopy due 2020. PSA screening today with labs. Encouraged him to consume a balanced diet and exercise regimen. Encouraged him to see an eye doctor and dentist annually. Will check CBC, CMET, Lipid, and PSA today.  Next appointment:  1 year for annual exam

## 2016-02-12 ENCOUNTER — Telehealth: Payer: Self-pay | Admitting: Internal Medicine

## 2016-02-12 NOTE — Telephone Encounter (Signed)
Patient returned Jason Nolan's call. °

## 2016-02-20 ENCOUNTER — Encounter: Payer: Medicare Other | Admitting: Internal Medicine

## 2016-02-23 ENCOUNTER — Encounter: Payer: Self-pay | Admitting: Internal Medicine

## 2016-02-23 NOTE — Telephone Encounter (Signed)
Pt wants suggestions as what he can do about the low white blood count...please advise

## 2016-02-26 ENCOUNTER — Other Ambulatory Visit: Payer: Self-pay | Admitting: Internal Medicine

## 2016-02-26 DIAGNOSIS — D72819 Decreased white blood cell count, unspecified: Secondary | ICD-10-CM

## 2016-02-26 NOTE — Telephone Encounter (Signed)
Pt wants the referral for a hematologist

## 2016-03-08 ENCOUNTER — Inpatient Hospital Stay: Payer: Medicare Other | Attending: Internal Medicine | Admitting: Internal Medicine

## 2016-03-08 VITALS — BP 126/78 | HR 54 | Temp 96.2°F | Resp 20 | Wt 203.5 lb

## 2016-03-08 DIAGNOSIS — D72818 Other decreased white blood cell count: Secondary | ICD-10-CM | POA: Insufficient documentation

## 2016-03-08 DIAGNOSIS — D72810 Lymphocytopenia: Secondary | ICD-10-CM | POA: Insufficient documentation

## 2016-03-08 DIAGNOSIS — D709 Neutropenia, unspecified: Secondary | ICD-10-CM | POA: Insufficient documentation

## 2016-03-08 DIAGNOSIS — D72819 Decreased white blood cell count, unspecified: Secondary | ICD-10-CM | POA: Insufficient documentation

## 2016-03-08 DIAGNOSIS — Z8619 Personal history of other infectious and parasitic diseases: Secondary | ICD-10-CM | POA: Diagnosis not present

## 2016-03-08 DIAGNOSIS — D708 Other neutropenia: Secondary | ICD-10-CM

## 2016-03-08 DIAGNOSIS — B182 Chronic viral hepatitis C: Secondary | ICD-10-CM

## 2016-03-08 NOTE — Progress Notes (Signed)
Patient here today for initial evaluation regarding abnormal labs, low white blood cell count. Patient reports his white blood count is chronically low. Patient denies any concerns today.

## 2016-03-31 DIAGNOSIS — D72819 Decreased white blood cell count, unspecified: Secondary | ICD-10-CM | POA: Insufficient documentation

## 2016-03-31 NOTE — Progress Notes (Signed)
[facility]  Telephone:(336) [phone] Fax:(336) [phone]  ID: Jason CroakJames Nolan OB: 19-May-1945  MR#: [id number]  AOZ#:[id number]#:[id number]  Patient Care Team: Lorre Munroeegina W Baity, NP as PCP - General (Internal Medicine)  CHIEF COMPLAINT:  Chronic leukopenia  HPI:   This is a pleasant 70 year old gentleman who was noted to have a wbc count of 2.8 on routine exam in 01/2016. He denies any frequent infections, mouth sores, diarrhea or bleeding No excess fatigue, no weight loss,  He is physically active and had no recent change in his health  He has a history of Hepatitis C treatment Harvoni x 12 weeks 10/04/13- 12/26/13 With CURE OF HCV INFECTION.He had reportedly failed previously PEG- IFN and Ribavirin    Results review ( External and Internal):  [facility] he has had a wbc count of 2.4 in 05/2011 and has had fluctuation between 2.5 to 3.5 since then. [facility] predominantly but he also has had mild lymphocytopenia and monocytopenia  He had a recent US liver that showed no cirrhosis. No splenomegaly. He had several HEP c RNA reported negative most recent one was 12/2015 indicating HCV cure  LAB RESULTS:  Lab Results  Component Value Date   NA 139 01/26/2016   K 4.0 01/26/2016   CL 103 01/26/2016   CO2 29 01/26/2016   GLUCOSE 77 01/26/2016   BUN 16 01/26/2016   CREATININE 1.04 01/26/2016   CALCIUM 8.9 01/26/2016   PROT 7.2 01/26/2016   ALBUMIN 4.2 01/26/2016   AST 18 01/26/2016   ALT 20 01/26/2016   ALKPHOS 52 01/26/2016   BILITOT 0.8 01/26/2016    Lab Results  Component Value Date   WBC 2.8 Repeated and verified X2. (L) 01/26/2016   HGB 15.6 01/26/2016   HCT 45.9 01/26/2016   MCV 90.9 01/26/2016   PLT 183.0 01/26/2016    REVIEW OF SYSTEMS:   Review of Systems  Constitutional: Negative.   HENT: Negative.   Eyes: Negative.   Respiratory: Negative.   Cardiovascular: Negative.   Gastrointestinal: Negative.     Genitourinary: Negative.   Musculoskeletal: Negative.   Skin: Negative.   Neurological: Negative.   Endo/Heme/Allergies: Negative.   Psychiatric/Behavioral: Negative.   All other systems reviewed and are negative.   As per HPI. Otherwise, a complete review of systems is negative.  PAST MEDICAL HISTORY: Past Medical History:  Diagnosis Date  . Hepatitis C     PAST SURGICAL HISTORY: No past surgical history on file.  FAMILY HISTORY: Family History  Problem Relation Age of Onset  . Hypertension Mother   . Hypertension Father    Social History   Social History Narrative   Retired [employer] driver, no H/o illicit drug use, alcoholism or smoking    No Known Allergies  Vitals:   03/08/16 0916  BP: 126/78  Pulse: (!) 54  Resp: 20  Temp: (!) 96.2 F (35.7 C)     Body mass index is 29.41 kg/m.   2.13 meters squared    Physical Exam  Constitutional: He is oriented to person, place, and time. He appears well-developed and well-nourished.  HENT:  Head: Normocephalic and atraumatic.  Eyes: EOM are normal. Pupils are equal, round, and reactive to light.  Neck: Normal range of motion. Neck supple.  Cardiovascular: Normal rate.   Pulmonary/Chest: Effort normal and breath sounds normal.  Abdominal: Soft. Bowel sounds are normal. He exhibits no mass.  Musculoskeletal: Normal range of motion. He  exhibits no edema, tenderness or deformity.  Lymphadenopathy:    He has no cervical adenopathy.  Neurological: He is alert and oriented to person, place, and time.  Skin: Skin is dry.   No petechiae, no icterus  Impression and plan: # Long standing leukopenia predominantly neutropenia dating back to at least 2013, with no  significant change.  No other cytopenias  This is likely autoimmune in nature. There is no splenomegaly.  Since he has maintained this level over 4 yrs with no clinical consequence to him, no  intervention is necessary at this time.  I have not drawn any  labs today, I would recommend a B12 level check at his next visit  with his PCP and replacement as needed.  We will be happy to see him back in the future if his wbc count deteriorates or if he  develops new cytopenias.  # Prior history of Chronic Hep C infection with HCV genotype 1b,now cured with\ HARVONI.  Ending in 10/2013 .   [facility] a recent Hepatic ultrasound showed a normal  appearing liver with  no evidence of cirrhosis of the liver.  He continues to follow with them.     Return if symptoms worsen or fail to improve.   Patient expressed understanding and was in agreement with this plan. He also understands that He can call clinic at any time with any questions, concerns, or complaints.   -------------------------------------------------------------------------------------------------------------------    This note was generated in part with voice recognition software and I apologize for any typographical errors that were not detected and corrected.    Towana BadgerSirisha Dafne Nield, MD   03/31/2016 2:24 PM

## 2016-05-17 DIAGNOSIS — H401113 Primary open-angle glaucoma, right eye, severe stage: Secondary | ICD-10-CM | POA: Diagnosis not present

## 2016-05-17 DIAGNOSIS — H5203 Hypermetropia, bilateral: Secondary | ICD-10-CM | POA: Diagnosis not present

## 2016-05-17 DIAGNOSIS — H524 Presbyopia: Secondary | ICD-10-CM | POA: Diagnosis not present

## 2016-05-17 DIAGNOSIS — H43813 Vitreous degeneration, bilateral: Secondary | ICD-10-CM | POA: Diagnosis not present

## 2016-05-17 DIAGNOSIS — H401122 Primary open-angle glaucoma, left eye, moderate stage: Secondary | ICD-10-CM | POA: Diagnosis not present

## 2016-05-17 DIAGNOSIS — H25813 Combined forms of age-related cataract, bilateral: Secondary | ICD-10-CM | POA: Diagnosis not present

## 2016-05-31 DIAGNOSIS — Z23 Encounter for immunization: Secondary | ICD-10-CM | POA: Diagnosis not present

## 2016-06-14 DIAGNOSIS — H401113 Primary open-angle glaucoma, right eye, severe stage: Secondary | ICD-10-CM | POA: Diagnosis not present

## 2016-06-14 DIAGNOSIS — H401122 Primary open-angle glaucoma, left eye, moderate stage: Secondary | ICD-10-CM | POA: Diagnosis not present

## 2016-07-12 DIAGNOSIS — H401122 Primary open-angle glaucoma, left eye, moderate stage: Secondary | ICD-10-CM | POA: Diagnosis not present

## 2016-07-12 DIAGNOSIS — H401113 Primary open-angle glaucoma, right eye, severe stage: Secondary | ICD-10-CM | POA: Diagnosis not present

## 2016-09-27 DIAGNOSIS — H401113 Primary open-angle glaucoma, right eye, severe stage: Secondary | ICD-10-CM | POA: Diagnosis not present

## 2016-09-27 DIAGNOSIS — H401122 Primary open-angle glaucoma, left eye, moderate stage: Secondary | ICD-10-CM | POA: Diagnosis not present

## 2016-09-27 DIAGNOSIS — H10413 Chronic giant papillary conjunctivitis, bilateral: Secondary | ICD-10-CM | POA: Diagnosis not present

## 2016-10-11 DIAGNOSIS — H401122 Primary open-angle glaucoma, left eye, moderate stage: Secondary | ICD-10-CM | POA: Diagnosis not present

## 2016-10-11 DIAGNOSIS — H401113 Primary open-angle glaucoma, right eye, severe stage: Secondary | ICD-10-CM | POA: Diagnosis not present

## 2016-10-11 DIAGNOSIS — H10413 Chronic giant papillary conjunctivitis, bilateral: Secondary | ICD-10-CM | POA: Diagnosis not present

## 2016-10-15 DIAGNOSIS — H401112 Primary open-angle glaucoma, right eye, moderate stage: Secondary | ICD-10-CM | POA: Diagnosis not present

## 2016-10-15 DIAGNOSIS — H401121 Primary open-angle glaucoma, left eye, mild stage: Secondary | ICD-10-CM | POA: Diagnosis not present

## 2016-11-12 DIAGNOSIS — H401112 Primary open-angle glaucoma, right eye, moderate stage: Secondary | ICD-10-CM | POA: Diagnosis not present

## 2016-11-12 DIAGNOSIS — H401121 Primary open-angle glaucoma, left eye, mild stage: Secondary | ICD-10-CM | POA: Diagnosis not present

## 2016-11-25 ENCOUNTER — Other Ambulatory Visit: Payer: Self-pay

## 2016-11-25 ENCOUNTER — Emergency Department
Admission: EM | Admit: 2016-11-25 | Discharge: 2016-11-25 | Disposition: A | Discharge status: Other Acute Inpatient Hospital | Payer: Medicare Other | Attending: Emergency Medicine | Admitting: Emergency Medicine

## 2016-11-25 ENCOUNTER — Emergency Department (HOSPITAL_COMMUNITY): Payer: Medicare Other

## 2016-11-25 ENCOUNTER — Encounter: Payer: Self-pay | Admitting: Emergency Medicine

## 2016-11-25 ENCOUNTER — Emergency Department (HOSPITAL_COMMUNITY): Payer: Medicare Other | Admitting: Anesthesiology

## 2016-11-25 ENCOUNTER — Inpatient Hospital Stay (HOSPITAL_COMMUNITY)
Admission: EM | Admit: 2016-11-25 | Discharge: 2016-11-29 | DRG: 024 | Disposition: A | Discharge status: Rehabilitation Facility | Payer: Medicare Other | Attending: Neurology | Admitting: Neurology

## 2016-11-25 ENCOUNTER — Encounter (HOSPITAL_COMMUNITY)
Admission: EM | Disposition: A | Discharge status: Rehabilitation Facility | Payer: Self-pay | Source: Home / Self Care | Attending: Neurology

## 2016-11-25 ENCOUNTER — Inpatient Hospital Stay (HOSPITAL_COMMUNITY): Payer: Medicare Other

## 2016-11-25 ENCOUNTER — Emergency Department: Payer: Medicare Other

## 2016-11-25 ENCOUNTER — Encounter (HOSPITAL_COMMUNITY): Payer: Self-pay | Admitting: Emergency Medicine

## 2016-11-25 DIAGNOSIS — H409 Unspecified glaucoma: Secondary | ICD-10-CM | POA: Diagnosis present

## 2016-11-25 DIAGNOSIS — Z01818 Encounter for other preprocedural examination: Secondary | ICD-10-CM | POA: Diagnosis not present

## 2016-11-25 DIAGNOSIS — G8194 Hemiplegia, unspecified affecting left nondominant side: Secondary | ICD-10-CM | POA: Diagnosis not present

## 2016-11-25 DIAGNOSIS — D72819 Decreased white blood cell count, unspecified: Secondary | ICD-10-CM | POA: Diagnosis present

## 2016-11-25 DIAGNOSIS — D709 Neutropenia, unspecified: Secondary | ICD-10-CM | POA: Diagnosis not present

## 2016-11-25 DIAGNOSIS — R1310 Dysphagia, unspecified: Secondary | ICD-10-CM | POA: Diagnosis present

## 2016-11-25 DIAGNOSIS — R269 Unspecified abnormalities of gait and mobility: Secondary | ICD-10-CM | POA: Diagnosis not present

## 2016-11-25 DIAGNOSIS — R471 Dysarthria and anarthria: Secondary | ICD-10-CM | POA: Diagnosis not present

## 2016-11-25 DIAGNOSIS — Z4589 Encounter for adjustment and management of other implanted devices: Secondary | ICD-10-CM | POA: Diagnosis not present

## 2016-11-25 DIAGNOSIS — I6521 Occlusion and stenosis of right carotid artery: Secondary | ICD-10-CM | POA: Diagnosis present

## 2016-11-25 DIAGNOSIS — Z8673 Personal history of transient ischemic attack (TIA), and cerebral infarction without residual deficits: Secondary | ICD-10-CM | POA: Diagnosis present

## 2016-11-25 DIAGNOSIS — E46 Unspecified protein-calorie malnutrition: Secondary | ICD-10-CM | POA: Diagnosis not present

## 2016-11-25 DIAGNOSIS — Q251 Coarctation of aorta: Secondary | ICD-10-CM

## 2016-11-25 DIAGNOSIS — E118 Type 2 diabetes mellitus with unspecified complications: Secondary | ICD-10-CM | POA: Diagnosis not present

## 2016-11-25 DIAGNOSIS — R29704 NIHSS score 4: Secondary | ICD-10-CM | POA: Diagnosis not present

## 2016-11-25 DIAGNOSIS — B1920 Unspecified viral hepatitis C without hepatic coma: Secondary | ICD-10-CM | POA: Diagnosis present

## 2016-11-25 DIAGNOSIS — E669 Obesity, unspecified: Secondary | ICD-10-CM | POA: Diagnosis present

## 2016-11-25 DIAGNOSIS — I672 Cerebral atherosclerosis: Secondary | ICD-10-CM | POA: Diagnosis present

## 2016-11-25 DIAGNOSIS — I639 Cerebral infarction, unspecified: Secondary | ICD-10-CM

## 2016-11-25 DIAGNOSIS — I161 Hypertensive emergency: Secondary | ICD-10-CM | POA: Diagnosis not present

## 2016-11-25 DIAGNOSIS — I69398 Other sequelae of cerebral infarction: Secondary | ICD-10-CM | POA: Diagnosis not present

## 2016-11-25 DIAGNOSIS — E785 Hyperlipidemia, unspecified: Secondary | ICD-10-CM | POA: Diagnosis present

## 2016-11-25 DIAGNOSIS — R29810 Facial weakness: Secondary | ICD-10-CM | POA: Insufficient documentation

## 2016-11-25 DIAGNOSIS — I69322 Dysarthria following cerebral infarction: Secondary | ICD-10-CM | POA: Diagnosis not present

## 2016-11-25 DIAGNOSIS — G9340 Encephalopathy, unspecified: Secondary | ICD-10-CM | POA: Diagnosis not present

## 2016-11-25 DIAGNOSIS — G9752 Postprocedural hemorrhage and hematoma of a nervous system organ or structure following other procedure: Secondary | ICD-10-CM | POA: Diagnosis not present

## 2016-11-25 DIAGNOSIS — I679 Cerebrovascular disease, unspecified: Secondary | ICD-10-CM | POA: Diagnosis not present

## 2016-11-25 DIAGNOSIS — J9811 Atelectasis: Secondary | ICD-10-CM | POA: Diagnosis not present

## 2016-11-25 DIAGNOSIS — Z9289 Personal history of other medical treatment: Secondary | ICD-10-CM

## 2016-11-25 DIAGNOSIS — I10 Essential (primary) hypertension: Secondary | ICD-10-CM | POA: Diagnosis present

## 2016-11-25 DIAGNOSIS — I6601 Occlusion and stenosis of right middle cerebral artery: Secondary | ICD-10-CM | POA: Diagnosis not present

## 2016-11-25 DIAGNOSIS — Z6827 Body mass index (BMI) 27.0-27.9, adult: Secondary | ICD-10-CM | POA: Diagnosis not present

## 2016-11-25 DIAGNOSIS — J9601 Acute respiratory failure with hypoxia: Secondary | ICD-10-CM | POA: Diagnosis not present

## 2016-11-25 DIAGNOSIS — I69320 Aphasia following cerebral infarction: Secondary | ICD-10-CM | POA: Diagnosis not present

## 2016-11-25 DIAGNOSIS — R51 Headache: Secondary | ICD-10-CM | POA: Diagnosis not present

## 2016-11-25 DIAGNOSIS — I69391 Dysphagia following cerebral infarction: Secondary | ICD-10-CM | POA: Diagnosis not present

## 2016-11-25 DIAGNOSIS — I6340 Cerebral infarction due to embolism of unspecified cerebral artery: Secondary | ICD-10-CM | POA: Insufficient documentation

## 2016-11-25 DIAGNOSIS — Z87891 Personal history of nicotine dependence: Secondary | ICD-10-CM | POA: Insufficient documentation

## 2016-11-25 DIAGNOSIS — D62 Acute posthemorrhagic anemia: Secondary | ICD-10-CM | POA: Diagnosis not present

## 2016-11-25 DIAGNOSIS — Z79899 Other long term (current) drug therapy: Secondary | ICD-10-CM

## 2016-11-25 DIAGNOSIS — Y838 Other surgical procedures as the cause of abnormal reaction of the patient, or of later complication, without mention of misadventure at the time of the procedure: Secondary | ICD-10-CM | POA: Diagnosis not present

## 2016-11-25 DIAGNOSIS — I63512 Cerebral infarction due to unspecified occlusion or stenosis of left middle cerebral artery: Secondary | ICD-10-CM | POA: Insufficient documentation

## 2016-11-25 DIAGNOSIS — I63411 Cerebral infarction due to embolism of right middle cerebral artery: Principal | ICD-10-CM | POA: Diagnosis present

## 2016-11-25 DIAGNOSIS — Z9989 Dependence on other enabling machines and devices: Secondary | ICD-10-CM | POA: Diagnosis not present

## 2016-11-25 DIAGNOSIS — I69353 Hemiplegia and hemiparesis following cerebral infarction affecting right non-dominant side: Secondary | ICD-10-CM | POA: Diagnosis not present

## 2016-11-25 DIAGNOSIS — Z6830 Body mass index (BMI) 30.0-30.9, adult: Secondary | ICD-10-CM

## 2016-11-25 DIAGNOSIS — Z713 Dietary counseling and surveillance: Secondary | ICD-10-CM

## 2016-11-25 DIAGNOSIS — Z8249 Family history of ischemic heart disease and other diseases of the circulatory system: Secondary | ICD-10-CM

## 2016-11-25 DIAGNOSIS — I6300 Cerebral infarction due to thrombosis of unspecified precerebral artery: Secondary | ICD-10-CM

## 2016-11-25 DIAGNOSIS — R1312 Dysphagia, oropharyngeal phase: Secondary | ICD-10-CM | POA: Diagnosis not present

## 2016-11-25 DIAGNOSIS — I63511 Cerebral infarction due to unspecified occlusion or stenosis of right middle cerebral artery: Secondary | ICD-10-CM | POA: Diagnosis not present

## 2016-11-25 DIAGNOSIS — Z4682 Encounter for fitting and adjustment of non-vascular catheter: Secondary | ICD-10-CM | POA: Diagnosis not present

## 2016-11-25 DIAGNOSIS — R531 Weakness: Secondary | ICD-10-CM | POA: Insufficient documentation

## 2016-11-25 DIAGNOSIS — G8320 Monoplegia of upper limb affecting unspecified side: Secondary | ICD-10-CM | POA: Diagnosis not present

## 2016-11-25 DIAGNOSIS — G8192 Hemiplegia, unspecified affecting left dominant side: Secondary | ICD-10-CM | POA: Diagnosis present

## 2016-11-25 DIAGNOSIS — R4701 Aphasia: Secondary | ICD-10-CM | POA: Diagnosis not present

## 2016-11-25 DIAGNOSIS — Z0189 Encounter for other specified special examinations: Secondary | ICD-10-CM

## 2016-11-25 HISTORY — PX: IR INTRAVSC STENT CERV CAROTID W/O EMB-PROT MOD SED INC ANGIO: IMG2304

## 2016-11-25 HISTORY — PX: IR ANGIO VERTEBRAL SEL SUBCLAVIAN INNOMINATE UNI R MOD SED: IMG5365

## 2016-11-25 HISTORY — PX: IR PERCUTANEOUS ART THROMBECTOMY/INFUSION INTRACRANIAL INC DIAG ANGIO: IMG6087

## 2016-11-25 HISTORY — PX: IR ANGIO INTRA EXTRACRAN SEL COM CAROTID INNOMINATE UNI L MOD SED: IMG5358

## 2016-11-25 HISTORY — DX: Unspecified glaucoma: H40.9

## 2016-11-25 HISTORY — PX: IR ANGIO VERTEBRAL SEL VERTEBRAL UNI L MOD SED: IMG5367

## 2016-11-25 HISTORY — PX: RADIOLOGY WITH ANESTHESIA: SHX6223

## 2016-11-25 LAB — BLOOD GAS, ARTERIAL
ACID-BASE DEFICIT: 2.6 mmol/L — AB (ref 0.0–2.0)
BICARBONATE: 22.4 mmol/L (ref 20.0–28.0)
Drawn by: 414221
FIO2: 60
LHR: 16 {breaths}/min
MECHVT: 580 mL
O2 SAT: 99.1 %
PATIENT TEMPERATURE: 94.6
PCO2 ART: 39 mmHg (ref 32.0–48.0)
PEEP: 5 cmH2O
PH ART: 7.364 (ref 7.350–7.450)
PO2 ART: 195 mmHg — AB (ref 83.0–108.0)

## 2016-11-25 LAB — COMPREHENSIVE METABOLIC PANEL
ALBUMIN: 4.4 g/dL (ref 3.5–5.0)
ALT: 23 U/L (ref 17–63)
AST: 25 U/L (ref 15–41)
Alkaline Phosphatase: 52 U/L (ref 38–126)
Anion gap: 8 (ref 5–15)
BUN: 14 mg/dL (ref 6–20)
CHLORIDE: 104 mmol/L (ref 101–111)
CO2: 24 mmol/L (ref 22–32)
CREATININE: 1.01 mg/dL (ref 0.61–1.24)
Calcium: 9.3 mg/dL (ref 8.9–10.3)
GFR calc Af Amer: >60 mL/min (ref >60)
Glucose, Bld: 98 mg/dL (ref 65–99)
POTASSIUM: 4.1 mmol/L (ref 3.5–5.1)
SODIUM: 136 mmol/L (ref 135–145)
Total Bilirubin: 0.8 mg/dL (ref 0.3–1.2)
Total Protein: 7.6 g/dL (ref 6.5–8.1)

## 2016-11-25 LAB — APTT: APTT: 33 s (ref 24–36)

## 2016-11-25 LAB — DIFFERENTIAL
BASOS ABS: 0 10*3/uL (ref 0–0.1)
BASOS PCT: 1 %
EOS ABS: 0.1 10*3/uL (ref 0–0.7)
Eosinophils Relative: 3 %
Lymphocytes Relative: 34 %
Lymphs Abs: 1 10*3/uL (ref 1.0–3.6)
Monocytes Absolute: 0.2 10*3/uL (ref 0.2–1.0)
Monocytes Relative: 8 %
NEUTROS ABS: 1.7 10*3/uL (ref 1.4–6.5)
Neutrophils Relative %: 54 %

## 2016-11-25 LAB — CBC
HEMATOCRIT: 46 % (ref 40.0–52.0)
Hemoglobin: 15.4 g/dL (ref 13.0–18.0)
MCH: 30.3 pg (ref 26.0–34.0)
MCHC: 33.4 g/dL (ref 32.0–36.0)
MCV: 90.6 fL (ref 80.0–100.0)
PLATELETS: 188 10*3/uL (ref 150–440)
RBC: 5.08 MIL/uL (ref 4.40–5.90)
RDW: 13.9 % (ref 11.5–14.5)
WBC: 3.1 10*3/uL — AB (ref 3.8–10.6)

## 2016-11-25 LAB — PROTIME-INR
INR: 1.04
PROTHROMBIN TIME: 13.6 s (ref 11.4–15.2)

## 2016-11-25 LAB — GLUCOSE, CAPILLARY: GLUCOSE-CAPILLARY: 88 mg/dL (ref 65–99)

## 2016-11-25 LAB — TROPONIN I

## 2016-11-25 SURGERY — RADIOLOGY WITH ANESTHESIA
Anesthesia: Monitor Anesthesia Care

## 2016-11-25 MED ORDER — ORAL CARE MOUTH RINSE: 15.0000 mL | Freq: Four times a day (QID) | OROMUCOSAL | Status: DC

## 2016-11-25 MED ORDER — ACETAMINOPHEN 325 MG PO TABS
650.0000 mg | ORAL_TABLET | ORAL | Status: DC | PRN
  Administered 2016-11-28: 650 mg via ORAL
  Filled 2016-11-25 (×2): qty 2

## 2016-11-25 MED ORDER — FENTANYL CITRATE (PF) 100 MCG/2ML IJ SOLN
50.0000 ug | INTRAMUSCULAR | Status: DC | PRN
  Administered 2016-11-26 (×4): 50 ug via INTRAVENOUS
  Filled 2016-11-25 (×4): qty 2

## 2016-11-25 MED ORDER — SODIUM CHLORIDE 0.9 % IJ SOLN
INTRAMUSCULAR | Status: DC | PRN
  Administered 2016-11-25 (×2): 25 ug via INTRA_ARTERIAL

## 2016-11-25 MED ORDER — LIDOCAINE HCL (PF) 1 % IJ SOLN
INTRAMUSCULAR | Status: AC
  Filled 2016-11-25: qty 30

## 2016-11-25 MED ORDER — ASPIRIN 325 MG PO TABS
ORAL_TABLET | ORAL | Status: AC
  Filled 2016-11-25: qty 2

## 2016-11-25 MED ORDER — CHLORHEXIDINE GLUCONATE 0.12% ORAL RINSE (MEDLINE KIT)
15.0000 mL | Freq: Two times a day (BID) | OROMUCOSAL | Status: DC
  Administered 2016-11-25 – 2016-11-29 (×8): 15 mL via OROMUCOSAL

## 2016-11-25 MED ORDER — LABETALOL HCL 5 MG/ML IV SOLN
INTRAVENOUS | Status: DC | PRN
  Administered 2016-11-25 (×2): 10 mg via INTRAVENOUS

## 2016-11-25 MED ORDER — EPTIFIBATIDE 20 MG/10ML IV SOLN
INTRAVENOUS | Status: DC | PRN
  Administered 2016-11-25: 1.8 mg via INTRAVENOUS

## 2016-11-25 MED ORDER — HYDRALAZINE HCL 20 MG/ML IJ SOLN
INTRAMUSCULAR | Status: DC | PRN
  Administered 2016-11-25: 10 mg via INTRAVENOUS
  Administered 2016-11-25 (×2): 5 mg via INTRAVENOUS

## 2016-11-25 MED ORDER — NIMODIPINE 60 MG/20ML PO SOLN: 60.0000 mg | ORAL | Status: DC

## 2016-11-25 MED ORDER — NITROGLYCERIN 1 MG/10 ML FOR IR/CATH LAB
INTRA_ARTERIAL | Status: AC
  Filled 2016-11-25: qty 10

## 2016-11-25 MED ORDER — LATANOPROST 0.005 % OP SOLN
1.0000 [drp] | Freq: Every day | OPHTHALMIC | Status: DC
  Administered 2016-11-25 – 2016-11-28 (×4): 1 [drp] via OPHTHALMIC
  Filled 2016-11-25: qty 2.5

## 2016-11-25 MED ORDER — FENTANYL CITRATE (PF) 100 MCG/2ML IJ SOLN
INTRAMUSCULAR | Status: DC | PRN
  Administered 2016-11-25: 75 ug via INTRAVENOUS
  Administered 2016-11-25: 25 ug via INTRAVENOUS

## 2016-11-25 MED ORDER — PROPOFOL 10 MG/ML IV BOLUS
INTRAVENOUS | Status: DC | PRN
  Administered 2016-11-25: 120 mg via INTRAVENOUS
  Administered 2016-11-25: 60 mg via INTRAVENOUS

## 2016-11-25 MED ORDER — NICARDIPINE HCL IN NACL 20-0.86 MG/200ML-% IV SOLN: 3.0000 mg/h | INTRAVENOUS | Status: DC

## 2016-11-25 MED ORDER — OLOPATADINE HCL 0.1 % OP SOLN
1.0000 [drp] | Freq: Two times a day (BID) | OPHTHALMIC | Status: DC
  Administered 2016-11-25 – 2016-11-29 (×8): 1 [drp] via OPHTHALMIC
  Filled 2016-11-25: qty 5

## 2016-11-25 MED ORDER — SUCCINYLCHOLINE CHLORIDE 20 MG/ML IJ SOLN
INTRAMUSCULAR | Status: DC | PRN
  Administered 2016-11-25: 70 mg via INTRAVENOUS

## 2016-11-25 MED ORDER — IOPAMIDOL (ISOVUE-300) INJECTION 61%
INTRAVENOUS | Status: AC
  Filled 2016-11-25: qty 150

## 2016-11-25 MED ORDER — MIDAZOLAM HCL 2 MG/2ML IJ SOLN
INTRAMUSCULAR | Status: AC
  Filled 2016-11-25: qty 4

## 2016-11-25 MED ORDER — ORAL CARE MOUTH RINSE
15.0000 mL | OROMUCOSAL | Status: DC
  Administered 2016-11-25 – 2016-11-27 (×19): 15 mL via OROMUCOSAL

## 2016-11-25 MED ORDER — IOPAMIDOL (ISOVUE-300) INJECTION 61%
300.0000 mL | Freq: Once | INTRAVENOUS | Status: AC | PRN
  Administered 2016-11-25: 150 mL via INTRAVENOUS

## 2016-11-25 MED ORDER — PHENYLEPHRINE 40 MCG/ML (10ML) SYRINGE FOR IV PUSH (FOR BLOOD PRESSURE SUPPORT)
PREFILLED_SYRINGE | INTRAVENOUS | Status: DC | PRN
  Administered 2016-11-25: 80 ug via INTRAVENOUS

## 2016-11-25 MED ORDER — FENTANYL CITRATE (PF) 100 MCG/2ML IJ SOLN
INTRAMUSCULAR | Status: AC
  Filled 2016-11-25: qty 4

## 2016-11-25 MED ORDER — ROCURONIUM BROMIDE 10 MG/ML (PF) SYRINGE
PREFILLED_SYRINGE | INTRAVENOUS | Status: DC | PRN
  Administered 2016-11-25 (×2): 20 mg via INTRAVENOUS
  Administered 2016-11-25: 40 mg via INTRAVENOUS
  Administered 2016-11-25: 20 mg via INTRAVENOUS

## 2016-11-25 MED ORDER — SODIUM CHLORIDE 0.9 % IV SOLN
INTRAVENOUS | Status: DC
  Administered 2016-11-26 – 2016-11-28 (×2): via INTRAVENOUS

## 2016-11-25 MED ORDER — PROPOFOL 500 MG/50ML IV EMUL
INTRAVENOUS | Status: DC | PRN
  Administered 2016-11-25: 50 ug/kg/min via INTRAVENOUS

## 2016-11-25 MED ORDER — ASPIRIN 325 MG PO TABS
325.0000 mg | ORAL_TABLET | Freq: Every day | ORAL | Status: DC
  Administered 2016-11-26: 325 mg via ORAL
  Filled 2016-11-25: qty 1

## 2016-11-25 MED ORDER — SENNOSIDES-DOCUSATE SODIUM 8.6-50 MG PO TABS: 1.0000 | ORAL_TABLET | Freq: Every evening | ORAL | Status: DC | PRN

## 2016-11-25 MED ORDER — STROKE: EARLY STAGES OF RECOVERY BOOK
Freq: Once | Status: AC
  Administered 2016-11-25: 22:00:00
  Filled 2016-11-25: qty 1

## 2016-11-25 MED ORDER — FENTANYL CITRATE (PF) 100 MCG/2ML IJ SOLN
50.0000 ug | INTRAMUSCULAR | Status: AC | PRN
  Administered 2016-11-25 – 2016-11-26 (×3): 50 ug via INTRAVENOUS
  Filled 2016-11-25 (×3): qty 2

## 2016-11-25 MED ORDER — HYDRALAZINE HCL 20 MG/ML IJ SOLN
10.0000 mg | INTRAMUSCULAR | Status: DC | PRN
  Administered 2016-11-26: 20 mg via INTRAVENOUS
  Administered 2016-11-26 – 2016-11-27 (×3): 10 mg via INTRAVENOUS
  Filled 2016-11-25 (×3): qty 1

## 2016-11-25 MED ORDER — HEPARIN SODIUM (PORCINE) 5000 UNIT/ML IJ SOLN
5000.0000 [IU] | Freq: Three times a day (TID) | INTRAMUSCULAR | Status: DC
  Administered 2016-11-25 – 2016-11-29 (×10): 5000 [IU] via SUBCUTANEOUS
  Filled 2016-11-25 (×11): qty 1

## 2016-11-25 MED ORDER — CLOPIDOGREL BISULFATE 75 MG PO TABS
75.0000 mg | ORAL_TABLET | Freq: Every day | ORAL | Status: DC
  Administered 2016-11-26: 75 mg via ORAL
  Filled 2016-11-25: qty 1

## 2016-11-25 MED ORDER — MIDAZOLAM HCL 5 MG/5ML IJ SOLN
INTRAMUSCULAR | Status: DC | PRN
  Administered 2016-11-25: 1 mg via INTRAVENOUS

## 2016-11-25 MED ORDER — PROPOFOL 1000 MG/100ML IV EMUL
0.0000 ug/kg/min | INTRAVENOUS | Status: DC
  Administered 2016-11-25 – 2016-11-26 (×2): 50 ug/kg/min via INTRAVENOUS
  Administered 2016-11-26: 40 ug/kg/min via INTRAVENOUS
  Administered 2016-11-26: 25 ug/kg/min via INTRAVENOUS
  Administered 2016-11-26: 30 ug/kg/min via INTRAVENOUS
  Administered 2016-11-26: 35 ug/kg/min via INTRAVENOUS
  Administered 2016-11-27: 25 ug/kg/min via INTRAVENOUS
  Filled 2016-11-25 (×7): qty 100

## 2016-11-25 MED ORDER — PHENYLEPHRINE HCL 10 MG/ML IJ SOLN
INTRAVENOUS | Status: DC | PRN
  Administered 2016-11-25: 20 ug/min via INTRAVENOUS

## 2016-11-25 MED ORDER — HEPARIN SODIUM (PORCINE) 1000 UNIT/ML IJ SOLN
INTRAMUSCULAR | Status: DC | PRN
  Administered 2016-11-25: 2000 [IU] via INTRAVENOUS

## 2016-11-25 MED ORDER — ASPIRIN 300 MG RE SUPP
300.0000 mg | Freq: Once | RECTAL | Status: AC
  Administered 2016-11-25: 300 mg via RECTAL
  Filled 2016-11-25: qty 1

## 2016-11-25 MED ORDER — EPTIFIBATIDE 20 MG/10ML IV SOLN
INTRAVENOUS | Status: AC
  Filled 2016-11-25: qty 10

## 2016-11-25 MED ORDER — ACETAMINOPHEN 650 MG RE SUPP: 650.0000 mg | RECTAL | Status: DC | PRN

## 2016-11-25 MED ORDER — ACETAMINOPHEN 160 MG/5ML PO SOLN
650.0000 mg | ORAL | Status: DC | PRN
  Administered 2016-11-26 – 2016-11-27 (×3): 650 mg
  Filled 2016-11-25 (×3): qty 20.3

## 2016-11-25 MED ORDER — ACETAMINOPHEN 325 MG PO TABS: 650.0000 mg | ORAL_TABLET | ORAL | Status: DC | PRN

## 2016-11-25 MED ORDER — SODIUM CHLORIDE 0.9 % IV SOLN
INTRAVENOUS | Status: DC | PRN
  Administered 2016-11-25 (×2): via INTRAVENOUS

## 2016-11-25 MED ORDER — IOPAMIDOL (ISOVUE-370) INJECTION 76%
100.0000 mL | Freq: Once | INTRAVENOUS | Status: AC | PRN
  Administered 2016-11-25: 100 mL via INTRAVENOUS

## 2016-11-25 MED ORDER — ACETAMINOPHEN 160 MG/5ML PO SOLN: 650.0000 mg | ORAL | Status: DC | PRN

## 2016-11-25 MED ORDER — IOPAMIDOL (ISOVUE-300) INJECTION 61%
INTRAVENOUS | Status: AC
  Filled 2016-11-25: qty 50

## 2016-11-25 MED ORDER — SODIUM CHLORIDE 0.9 % IV SOLN
INTRAVENOUS | Status: DC
  Administered 2016-11-25 – 2016-11-27 (×2): via INTRAVENOUS

## 2016-11-25 MED ORDER — PANTOPRAZOLE SODIUM 40 MG IV SOLR
40.0000 mg | Freq: Every day | INTRAVENOUS | Status: DC
  Administered 2016-11-25 – 2016-11-28 (×4): 40 mg via INTRAVENOUS
  Filled 2016-11-25 (×4): qty 40

## 2016-11-25 MED ORDER — ONDANSETRON HCL 4 MG/2ML IJ SOLN: 4.0000 mg | Freq: Four times a day (QID) | INTRAMUSCULAR | Status: DC | PRN

## 2016-11-25 MED ORDER — GLYCOPYRROLATE 0.2 MG/ML IJ SOLN
INTRAMUSCULAR | Status: DC | PRN
  Administered 2016-11-25 (×2): 0.2 mg via INTRAVENOUS

## 2016-11-25 MED ORDER — CLOPIDOGREL BISULFATE 300 MG PO TABS
ORAL_TABLET | ORAL | Status: AC
  Filled 2016-11-25: qty 1

## 2016-11-25 MED ORDER — CLEVIDIPINE BUTYRATE 0.5 MG/ML IV EMUL
0.0000 mg/h | INTRAVENOUS | Status: DC
  Administered 2016-11-26: 3 mg/h via INTRAVENOUS
  Administered 2016-11-26: 2 mg/h via INTRAVENOUS
  Filled 2016-11-25 (×2): qty 50

## 2016-11-25 MED ORDER — FENTANYL CITRATE (PF) 100 MCG/2ML IJ SOLN: 25.0000 ug | INTRAMUSCULAR | Status: DC | PRN

## 2016-11-25 NOTE — Anesthesia Procedure Notes (Signed)
Procedure Name: Intubation Performed by: Oleta Mouse Pre-anesthesia Checklist: Patient identified, Emergency Drugs available, Suction available, Patient being monitored and Timeout performed Patient Re-evaluated:Patient Re-evaluated prior to induction Oxygen Delivery Method: Circle system utilized Preoxygenation: Pre-oxygenation with 100% oxygen Induction Type: IV induction and Rapid sequence Ventilation: Mask ventilation without difficulty Laryngoscope Size: Mac and 4 Grade View: Grade II Tube type: Subglottic suction tube Tube size: 7.5 mm Number of attempts: 1 Airway Equipment and Method: Stylet Placement Confirmation: ETT inserted through vocal cords under direct vision,  positive ETCO2 and breath sounds checked- equal and bilateral Secured at: 22 cm Tube secured with: Tape Dental Injury: Teeth and Oropharynx as per pre-operative assessment

## 2016-11-25 NOTE — ED Notes (Signed)
Neurology at bedside.

## 2016-11-25 NOTE — H&P (Signed)
Stroke H&P  CC: Left-sided weakness, slurred speech, left facial droop  History is obtained from: Patient, chart  HPI: Jason Nolan is a 71 y.o. left-handed male who has a past medical history of hepatitis C treated with Harvoni, who was in his usual state of health and last normal at 2 AM when he went to bed, woke up this morning around 9 AM with left-sided weakness, left facial droop and slurred speech. 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 His NIH stroke scale at that hospital was recorded to be 6. Noncontrast head CT showed early changes of ischemia in the right MCA territory and a hyperdense right MCA. He was transferred over to [facility] for evaluation for possible thrombectomy. He was not given IV TPA because she was outside of the TPA window at the time of presentation.   LKW: 0200 on 11/25/2016 tpa given?: no, outside the window ICH Score: Not applicable Modified Rankin Score: 0  ROS: A 14 point ROS was performed and is negative except as noted in the HPI.    Past Medical History:  Diagnosis Date  . Glaucoma   . Hepatitis C      Family History  Problem Relation Age of Onset  . Hypertension Mother   . Hypertension Father      Social History:  reports that he has quit smoking. He quit smokeless tobacco use about 23 years ago. He reports that he does not drink alcohol or use drugs.   Exam: Current vital signs: BP (!) 174/86   Pulse (!) 56   Temp 98.2 F (36.8 C) (Oral)   Resp 10   SpO2 100%  Vital signs in last 24 hours: Temp:  [98.2 F (36.8 C)-98.3 F (36.8 C)] 98.2 F (36.8 C) (07/23 1557) Pulse Rate:  [54-61] 56 (07/23 1600) Resp:  [10-18] 10 (07/23 1600) BP: (142-178)/(74-109) 174/86 (07/23 1600) SpO2:  [97 %-100 %] 100 % (07/23 1600) Weight:  [90.7 kg (200 lb)] 90.7 kg (200 lb) (07/23 1338)  Physical Exam  Constitutional: Appears well-developed and well-nourished.   Psych: Affect appropriate to situation Eyes: No scleral injection HENT: No OP obstrucion Head: Normocephalic.  Cardiovascular: Normal rate and regular rhythm.  Respiratory: Effort normal and breath sounds normal to anterior ascultation GI: Soft.  No distension. There is no tenderness.  Skin: WDI  Neuro: Mental Status: Patient is awake, alert, oriented to person, place, month, year, and situation. Patient is able to give a clear and coherent history. No signs of aphasia or neglect Speech is severely dysarthric Cranial Nerves: II: Visual Fields are full. Pupils are equal, round, and reactive to light.   III,IV, VI: EOMI without ptosis or diploplia.  V: Facial sensation is symmetric to temperature VII: Facial movement is symmetric.  VIII: hearing is intact to voice X: Uvula elevates symmetrically XI: Shoulder shrug is symmetric. XII: tongue is midline without atrophy or fasciculations.  Motor: No vertical drift in upper or lower extremity. Left upper and lower extremity 4+/5 Right upper extremity and right lower extremity 5/5 Sensory: Sensation is symmetric to light touch and temperature in the arms and legs. Deep Tendon Reflexes: 2+ and symmetric in the biceps and patellae.  Plantars: Toes are downgoing bilaterally.  Cerebellar: FNF and HKS are intact bilaterally  I have reviewed labs in epic and the results pertinent to this consultation are: CBC    Component Value Date/Time   WBC 3.1 (  L) 11/25/2016 1347   RBC 5.08 11/25/2016 1347   HGB 15.4 11/25/2016 1347   HCT 46.0 11/25/2016 1347   PLT 188 11/25/2016 1347   MCV 90.6 11/25/2016 1347   MCH 30.3 11/25/2016 1347   MCHC 33.4 11/25/2016 1347   RDW 13.9 11/25/2016 1347   LYMPHSABS 1.0 11/25/2016 1347   MONOABS 0.2 11/25/2016 1347   EOSABS 0.1 11/25/2016 1347   BASOSABS 0.0 11/25/2016 1347   CMP     Component Value Date/Time   NA 136 11/25/2016 1347   K 4.1 11/25/2016 1347   CL 104 11/25/2016 1347   CO2 24  11/25/2016 1347   GLUCOSE 98 11/25/2016 1347   BUN 14 11/25/2016 1347   CREATININE 1.01 11/25/2016 1347   CALCIUM 9.3 11/25/2016 1347   PROT 7.6 11/25/2016 1347   ALBUMIN 4.4 11/25/2016 1347   AST 25 11/25/2016 1347   ALT 23 11/25/2016 1347   ALKPHOS 52 11/25/2016 1347   BILITOT 0.8 11/25/2016 1347   GFRNONAA >60 11/25/2016 1347   GFRAA >60 11/25/2016 1347    I have reviewed the images obtained: Noncontrast CT of the head at [facility] shows signs of early ischemia in the right MCA territory particularly in the insular region. Hyperdense RMCA. [facility]contrast [facility] shows progression of the similar changes in the right MCA territory. Aspects score of 8. No bleed. Hyperdense RMCA. CT angiogram of the head and neck and CT perfusion study shows severely stenotic/subocclusive right ICA at the bifurcation, distal right M1/proximal right M2 occlusion. Also seen is cerebral athero. The perfusion study shows a number of 86 mL with 0 mL of core, infinite mismatch ratio..  Assessment: 71 year old man with known vascular risk factors with acute onset of left hemiparesis, left facial weakness and dysarthria. He has a right MCA stroke possibly atheroembolic from his atherosclerotic right carotid bifurcation. He was outside of the window for IV TPA. Based on his initial NIH stroke scale of 6, which had then decreased to 4 [facility], I discussed this case with the interventional radiologist for possible diagnostic angiogram and the possibility of revascularization given a favorable noncontrast CT aspects score as well as favorable perfusion profile.  Impression Right MCA stroke - atheroembolic versus cardiac embolic Right carotid stenosis/occlusion Status post cerebral angiogram - currently in IR - plan for right carotid stenting/angioplasty followed by right MCA thrombectomy.  RECS: Acute Ischemic Stroke Cerebral infarction due to embolism  of right middle cerebral artery Occlusion and stenosis of R carotid artery Cerebral atherosclerosis  Acuity: Acute Current Suspected Etiology: atheroembolic v. cardioembolic Continue Evaluation:  -Admit to: ICU -Continue Aspirin/ Statin -Blood pressure control, goal of systolic 100-140 if revascularized. -MRI/ECHO/A1C/Lipid panel. -Hyperglycemia management per SSI to maintain glucose 140-180mg /dL. -PT/OT/ST therapies and recommendations when able  CNS -Close neuro monitoring  Dysarthria Dysphagia following cerebral infarction  -NPO until cleared by speech -ST -Advance diet as tolerated  Hemiplegia and hemiparesis following cerebral infarction affecting left dominant side  -PT/OT -consider PM&R consult  RESP Elective intubation for carotid stenting -vent management per ICU -wean when able  CV SBP 100-140 TTE Hyperlipidemia, unspecified  - Statin for goal LDL < 70  HEME -Monitor -transfuse for hgb < 7   ENDO -check A1c  GI/GU -Gentle hydration   Fluid/Electrolyte -Repeat labs and replete as needed  ID -NPO -Monitor  Nutrition E66.9 Obesity  E46 Protein-Calorie Malnutrition Mild Moderate Severe -diet consult  Prophylaxis DVT:  Sqh, scd GI: Pepcid Bowel: Doc/senna  Diet: NPO until cleared by speech  Code Status: Full Code     THE FOLLOWING WERE PRESENT ON ADMISSION: CNS -  Acute Ischemic Stroke, Hemiparesis,    ACUTE STROKE BENCHMARKS: TIME PT LAST SEEN NORMAL 0200 EMS PRE-NOTIFICATION Transfer from OSH - arrived 1520 hrs CODE STROKE ACTIVATION NA ARRIVAL TIME 1520 TIME OF STROKE TEAM EVALUATION 1522 CT HEAD READ TIME NA IV tPA bolus (time and dose) NA - outside window IV tPA infusion (time and dose) NA If not a candidate for IV tPA, why? Outside window Delays in this process: (None) D/w Dr. Corliss Skains after CTA/P. Arrival in IR suite 1616hrs    This patient is critically ill and at significant risk of neurological worsening,  death and care requires constant monitoring of vital signs, hemodynamics,respiratory and cardiac monitoring, neurological assessment, discussion with family, other specialists and medical decision making of high complexity. I spent 60  minutes of neurocritical care time  in the care of  this patient.  Milon Dikes, MD [facility] [phone]  If 7pm to 7am, please call on call as listed on AMION. Please page stroke NP  Or  PA  Or MD  from 8am -4 pm as this patient will be followed by the stroke team at this point.   You can look them up on [email or web address]

## 2016-11-25 NOTE — Progress Notes (Signed)
ASA 650 mg per OGT Plavix 300 mg per OGT  Given by anesthesia

## 2016-11-25 NOTE — Consult Note (Signed)
Referring Physician: Sharma Covert    Chief Complaint: Left sided weakness, difficulty with speech  HPI: Jason Nolan is an 71 y.o. male who went to bed at baseline this morning.  Was in bed with his wife at about 0900 and she noted some slurred speech but thought that he was joking with her.  When they were to get up about 1000 she noted that his speech was still slurred and that he had some right sided weakness.  After finding some transportation they presented for evaluation.  Initial NIHSS of 6.   Wife reports that for the past couple of months he patient has had intermittent left hand tingling.    Date last known well: Date: 11/25/2016 Time last known well: Time: 02:00 tPA Given: No: Outside time window  Past Medical History:  Diagnosis Date  . Glaucoma   . Hepatitis C     History reviewed. No pertinent surgical history.  Family History  Problem Relation Age of Onset  . Hypertension Mother   . Hypertension Father    Social History:  reports that he has quit smoking. He quit smokeless tobacco use about 23 years ago. He reports that he does not drink alcohol or use drugs.  Allergies: No Known Allergies  Medications: I have reviewed the patient's current medications. Prior to Admission:  Prior to Admission medications   Medication Sig Start Date End Date Taking? Authorizing Provider  bimatoprost (LUMIGAN) 0.01 % SOLN Place 1 drop into both eyes at bedtime.   Yes [provider]  PAZEO 0.7 % SOLN Place 1 drop into both eyes daily.  10/11/16  Yes [provider]  SIMBRINZA 1-0.2 % SUSP Place 1 drop into the right eye 2 (two) times daily. 09/04/16  Yes [provider]  VYZULTA 0.024 % SOLN Apply 1 drop to eye 2 (two) times daily.  11/12/16  Yes [provider]    ROS: History obtained from wife  General ROS: negative for - chills, fatigue, fever, night sweats, weight gain or weight loss Psychological ROS: negative for - behavioral disorder,  hallucinations, memory difficulties, mood swings or suicidal ideation Ophthalmic ROS: negative for - blurry vision, double vision, eye pain or loss of vision ENT ROS: negative for - epistaxis, nasal discharge, oral lesions, sore throat, tinnitus or vertigo Allergy and Immunology ROS: negative for - hives or itchy/watery eyes Hematological and Lymphatic ROS: negative for - bleeding problems, bruising or swollen lymph nodes Endocrine ROS: negative for - galactorrhea, hair pattern changes, polydipsia/polyuria or temperature intolerance Respiratory ROS: negative for - cough, hemoptysis, shortness of breath or wheezing Cardiovascular ROS: negative for - chest pain, dyspnea on exertion, edema or irregular heartbeat Gastrointestinal ROS: negative for - abdominal pain, diarrhea, hematemesis, nausea/vomiting or stool incontinence Genito-Urinary ROS: negative for - dysuria, hematuria, incontinence or urinary frequency/urgency Musculoskeletal ROS: negative for - joint swelling or muscular weakness Neurological ROS: as noted in HPI Dermatological ROS: negative for rash and skin lesion changes  Physical Examination: Blood pressure (!) 142/74, pulse (!) 54, temperature 98.3 F (36.8 C), resp. rate 18, height 5\' 10"  (1.778 m), weight 90.7 kg (200 lb), SpO2 98 %.  HEENT-  Normocephalic, no lesions, without obvious abnormality.  Normal external eye and conjunctiva.  Normal TM's bilaterally.  Normal auditory canals and external ears. Normal external nose, mucus membranes and septum.  Normal pharynx. Cardiovascular- S1, S2 normal, pulses palpable throughout   Lungs- chest clear, no wheezing, rales, normal symmetric air entry Abdomen- soft, non-tender; bowel sounds normal; no  masses,  no organomegaly Extremities- no edema Lymph-no adenopathy palpable Musculoskeletal-no joint tenderness, deformity or swelling Skin-warm and dry, no hyperpigmentation, vitiligo, or suspicious lesions  Neurological Examination    Mental Status: Alert, oriented, thought content appropriate.  Speech fluent without evidence of aphasia.  Able to follow 3 step commands without difficulty. Cranial Nerves: II: Discs flat bilaterally; Visual fields grossly normal, pupils equal, round, reactive to light and accommodation III,IV, VI: ptosis not present, extra-ocular motions intact bilaterally V,VII: left facial droop, facial light touch sensation normal bilaterally VIII: hearing normal bilaterally IX,X: gag reflex reduced XI: bilateral shoulder shrug XII: midline tongue extension Motor: Right : Upper extremity   5/5    Left:     Upper extremity   5-/5  Lower extremity   5/5     Lower extremity   5-/5 Tone and bulk:normal tone throughout; no atrophy noted Sensory: Pinprick and light touch intact throughout, bilaterally Deep Tendon Reflexes: 2+ in the upper extremities and trace in the lower extremities Plantars: Right: mute   Left: mute Cerebellar: Normal finger-to-nose and normal heel-to-shin testing bilaterally Gait: not tested duet to safety concerns    Laboratory Studies:  Basic Metabolic Panel:  Recent Labs Lab 11/25/16 1347  NA 136  K 4.1  CL 104  CO2 24  GLUCOSE 98  BUN 14  CREATININE 1.01  CALCIUM 9.3    Liver Function Tests:  Recent Labs Lab 11/25/16 1347  AST 25  ALT 23  ALKPHOS 52  BILITOT 0.8  PROT 7.6  ALBUMIN 4.4   No results for input(s): LIPASE, AMYLASE in the last 168 hours. No results for input(s): AMMONIA in the last 168 hours.  CBC:  Recent Labs Lab 11/25/16 1347  WBC 3.1*  NEUTROABS 1.7  HGB 15.4  HCT 46.0  MCV 90.6  PLT 188    Cardiac Enzymes:  Recent Labs Lab 11/25/16 1347  TROPONINI <0.03    BNP: Invalid input(s): POCBNP  CBG:  Recent Labs Lab 11/25/16 1352  GLUCAP 88    Microbiology: No results found for this or any previous visit.  Coagulation Studies: No results for input(s): LABPROT, INR in the last 72 hours.  Urinalysis: No results  for input(s): COLORURINE, LABSPEC, PHURINE, GLUCOSEU, HGBUR, BILIRUBINUR, KETONESUR, PROTEINUR, UROBILINOGEN, NITRITE, LEUKOCYTESUR in the last 168 hours.  Invalid input(s): APPERANCEUR  Lipid Panel:    Component Value Date/Time   CHOL 211 (H) 01/26/2016 1405   TRIG 68.0 01/26/2016 1405   HDL 63.30 01/26/2016 1405   CHOLHDL 3 01/26/2016 1405   VLDL 13.6 01/26/2016 1405   LDLCALC 134 (H) 01/26/2016 1405    HgbA1C: No results found for: HGBA1C  Urine Drug Screen:  No results found for: LABOPIA, COCAINSCRNUR, LABBENZ, AMPHETMU, THCU, LABBARB  Alcohol Level: No results for input(s): ETH in the last 168 hours.  Other results: EKG: sinus rhythm at 67 bpm.  Imaging: Ct Head Wo Contrast  Result Date: 11/25/2016 CLINICAL DATA:  71 year old who woke up this morning with facial droop, left upper extremity weakness and slurred speech. EXAM: CT HEAD WITHOUT CONTRAST TECHNIQUE: Contiguous axial images were obtained from the base of the skull through the vertex without intravenous contrast. COMPARISON:  None. FINDINGS: Brain: Ventricular system normal in size and appearance for age. Mild changes of small vessel disease of the white matter. Asymmetric low attenuation involving the white matter of the right superior temporal lobe extending into the right frontal lobe. No associated hemorrhage or hematoma. No extra-axial fluid collections. No visible mass lesion.  No midline shift. Mild cerebellar vermian atrophy. No cortical atrophy. Vascular: Hyperdense right middle cerebral artery. Mild bilateral carotid siphon atherosclerosis. Skull: No skull fracture or other focal osseous abnormality involving the skull. Sinuses/Orbits: Visualized paranasal sinuses, bilateral mastoid air cells and bilateral middle ear cavities well-aerated. Visualized orbits and globes are normal. Other: None. IMPRESSION: 1. 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 Hyperdense right middle cerebral artery indicates  acute or subacute occlusion. 2. No associated hemorrhage, hematoma, mass effect or midline shift at this time. I personally telephoned these results at the time of interpretation on 11/25/2016 at 1:51 pm to Dr. Rockne MenghiniANNE-[name] NORMAN, who verbally acknowledged these results. Electronically Signed   By: Hulan Saashomas  Lawrence M.D.   On: 11/25/2016 13:53    Assessment: 71 y.o. male presenting with dysarthria and mild right sided weakness.  Head CT reviewed and shows a hyperdense right MCA.  Despite LKW time with minimal symptoms can not rule out the possibility of more cerebrum at risk.  Patient to be transferred for further evaluation.  Case discussed with Dr. Wilford CornerArora who has accepted [facility] with option for intervention to be entertained.    Stroke Risk Factors - none  Plan: 1. Prophylactic therapy-Antiplatelet med: Aspirin - dose 300mg  rectally 2. NPO until RN stroke swallow screen 3. Telemetry monitoring 4. Frequent neuro checks 5. [facility]   Thana FarrLeslie Virginia Curl, MD Neurology [phone] 11/25/2016, 2:29 PM

## 2016-11-25 NOTE — Anesthesia Procedure Notes (Signed)
Arterial Line Insertion Start/End7/23/2018 5:09 PM, 11/25/2016 5:18 PM Performed by: Val EagleMOSER, Jered Heiny  Patient location: OR. Preanesthetic checklist: patient identified, IV checked, site marked, risks and benefits discussed, surgical consent, monitors and equipment checked, pre-op evaluation, timeout performed and anesthesia consent Lidocaine 1% used for infiltration Right, radial was placed Catheter size: 20 Fr Hand hygiene performed  and maximum sterile barriers used   Attempts: 1 Procedure performed using ultrasound guided technique. Ultrasound Notes:anatomy identified and no ultrasound evidence of intravascular and/or intraneural injection Following insertion, dressing applied. Post procedure assessment: normal and unchanged  Patient tolerated the procedure well with no immediate complications.

## 2016-11-25 NOTE — Consult Note (Signed)
PULMONARY / CRITICAL CARE MEDICINE   Name: Jason Nolan MRN: [id number] DOB: 12/30/1945    ADMISSION DATE:  11/25/2016 CONSULTATION DATE:  7/23  REFERRING MD:  Dr. Pearlean Brownie  CHIEF COMPLAINT:  CVA  HISTORY OF PRESENT ILLNESS:  Patient is encephalopathic and/or intubated. Therefore history has been obtained from chart review.  71 year old male with PMH as below, which is significant for Hepatitic C s/p treatment with Harvoni. He was last known normal at 2am on 7/23 when he went to sleep. He woke up around 9am with left sided weakness, facial droop, and slurred speech. 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 He was transferred to [facility] for IR evaluation. He underwent successful revascularization of R MCA and R ICA. Post-procedurally he remained on ventilator and was transferred to ICU. PCCM asked to assist with ventilatory management and ICU care.   PAST MEDICAL HISTORY :  He  has a past medical history of Glaucoma and Hepatitis C.  PAST SURGICAL HISTORY: He  has no past surgical history on file.  No Known Allergies  No current facility-administered medications on file prior to encounter.    Current Outpatient Prescriptions on File Prior to Encounter  Medication Sig  . bimatoprost (LUMIGAN) 0.01 % SOLN Place 1 drop into both eyes at bedtime.  Marland Kitchen PAZEO 0.7 % SOLN Place 1 drop into both eyes daily.   Marland Kitchen SIMBRINZA 1-0.2 % SUSP Place 1 drop into the right eye 2 (two) times daily.  Marland Kitchen VYZULTA 0.024 % SOLN Apply 1 drop to eye 2 (two) times daily.     FAMILY HISTORY:  His indicated that his mother is deceased. He indicated that his father is deceased.    SOCIAL HISTORY: He  reports that he has quit smoking. He quit smokeless tobacco use about 23 years ago. He reports that he does not drink alcohol or use drugs.  REVIEW OF SYSTEMS:   Unable as patient is encephalopathic and intubated.   SUBJECTIVE:    VITAL SIGNS: BP (!) 174/86    Pulse (!) 56   Temp 98.2 F (36.8 C) (Oral)   Resp 10   SpO2 100%   HEMODYNAMICS:    VENTILATOR SETTINGS:    INTAKE / OUTPUT: I/O last 3 completed shifts: In: 1000 [I.V.:1000] Out: 320 [Urine:300; Blood:20]  PHYSICAL EXAMINATION: General:  Adult male of normal body habitus in NAD Neuro:  Alert, oriented, non-focal HEENT:  [place]/AT, PERRL, no JVD. Corneas red/bloodshot Cardiovascular:  RRR, no MRG Lungs:  Clear Abdomen:  Soft, non-distended. BS hypoactive Musculoskeletal:  No acute deformity or ROM limitation Skin:  Grossly intact  LABS:  BMET  Recent Labs Lab 11/25/16 1347  NA 136  K 4.1  CL 104  CO2 24  BUN 14  CREATININE 1.01  GLUCOSE 98    Electrolytes  Recent Labs Lab 11/25/16 1347  CALCIUM 9.3    CBC  Recent Labs Lab 11/25/16 1347  WBC 3.1*  HGB 15.4  HCT 46.0  PLT 188    Coag's  Recent Labs Lab 11/25/16 1347  APTT 33  INR 1.04    Sepsis Markers No results for input(s): LATICACIDVEN, PROCALCITON, O2SATVEN in the last 168 hours.  ABG No results for input(s): PHART, PCO2ART, PO2ART in the last 168 hours.  Liver Enzymes  Recent Labs Lab 11/25/16 1347  AST 25  ALT 23  ALKPHOS 52  BILITOT 0.8  ALBUMIN 4.4    Cardiac  Enzymes  Recent Labs Lab 11/25/16 1347  TROPONINI <0.03    Glucose  Recent Labs Lab 11/25/16 1352  GLUCAP 88    Imaging Ct Angio Head W Or Wo Contrast  Result Date: 11/25/2016 CLINICAL DATA:  Initial evaluation for acute dysarthria, stroke. EXAM: CT ANGIOGRAPHY HEAD AND NECK CT PERFUSION BRAIN TECHNIQUE: Multidetector CT imaging of the head and neck was performed using the standard protocol during bolus administration of intravenous contrast. Multiplanar CT image reconstructions and MIPs were obtained to evaluate the vascular anatomy. Carotid stenosis measurements (when applicable) are obtained utilizing NASCET criteria, using the distal internal carotid diameter as the denominator. Multiphase CT  imaging of the brain was performed following IV bolus contrast injection. Subsequent parametric perfusion maps were calculated using RAPID software. CONTRAST:  100 cc of Isovue 370. COMPARISON:  Prior head CT from earlier the same day. 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 No evidence for hemorrhage or mass effect. Overall, this is stable to minimally progressed from scan performed earlier the same day and. No other acute large vessel territory infarct. No acute intracranial hemorrhage. No mass lesion, midline shift or mass effect. No hydrocephalus. No extra-axial fluid collection. Vascular: Focal hyperdensity within distal right M1/proximal right M2 branches, consistent with thrombus. Vascular calcifications noted within the carotid siphons. Skull: Scalp soft tissues and calvarium within normal limits. Sinuses/Orbits: Globes and orbital soft tissues within normal limits. Mild mucosal thickening within the maxillary sinuses. Paranasal sinuses are otherwise clear. No mastoid effusion. ASPECTS [facility] Stroke Program Early CT Score) - Ganglionic level infarction (caudate, lentiform nuclei, internal capsule, insula, M1-M3 cortex): 5 - Supraganglionic infarction (M4-M6 cortex): 2 Total score (0-10 with 10 being normal): 7 Review of the MIP images confirms the above findings CTA NECK FINDINGS Aortic arch: Visualized aortic arch of normal caliber with normal branch pattern. Minimal plaque within the arch itself. No flow-limiting stenosis about the origin of the great vessels. Visualized subclavian arteries widely patent. Right carotid system: Right common carotid artery widely patent from its origin to the bifurcation. A centric calcified plaque about the right bifurcation/proximal right ICA. There is abrupt near occlusion of the proximal right ICA at the bifurcation (series 12, image  202). A focal string sign is present. Intraluminal filling defect just distally consistent with thrombus. Area of involvement begins at the bifurcation and measures approximately 15 mm in length. A focal ruptured plaque with associated acute dissection is suspected. Distally, the right ICA is somewhat diminutive in caliber and irregular, but is patent to the skullbase without additional focal stenosis or other vascular abnormality. Left carotid system: Left common carotid artery widely patent from its origin to the bifurcation. Minimal plaque about the left bifurcation without stenosis. Left ICA widely patent distally to the skullbase without stenosis, dissection, or occlusion. Vertebral arteries: Left vertebral artery arises separately from the aortic arch. Right vertebral artery rises from the right subclavian artery. Right vertebral artery slightly dominant. Vertebral arteries patent within the neck without stenosis, dissection, or occlusion. Skeleton: No acute osseus abnormality. No worrisome lytic or blastic osseous lesions. Mild to moderate multilevel degenerative spondylolysis within the cervical spine, greatest at C5-6. Other neck: No acute soft tissue abnormality within the neck. Salivary glands normal. No adenopathy. Thyroid normal. Upper chest: Visualized upper chest within normal limits. Visualized lungs are clear. Review of the MIP images confirms the above findings CTA HEAD FINDINGS Anterior circulation:  Petrous, cavernous, and supraclinoid segments patent bilaterally without flow-limiting stenosis. Minimal plaque noted within the cavernous ICAs. ICA termini widely patent. A1 segments patent bilaterally. Anterior communicating artery normal. Anterior cerebral artery is patent to their distal aspects. Right M1 segment patent to its distal aspect. There is occlusion of proximal right M2 branch at the base of the right sylvian fissure (series 12, image 96), likely embolic from thrombus seen in the neck.  Right MCA branches demonstrate multifocal irregularity with stenoses distally. Left M1 segment patent to its distal aspect. No proximal left M2 occlusion. Left MCA branches well opacified to their distal aspects. Posterior circulation: Vertebral artery's patent to the vertebrobasilar junction without stenosis. Patent left PICA. Dominant right AICA. Basilar artery widely patent to its distal aspect. Superior cerebral arteries patent bilaterally. PCA supplied via the basilar as well is prominent bilateral posterior communicating arteries. PCAs patent to their distal aspects. Venous sinuses: Patent. Anatomic variants: No significant anatomic variant. No aneurysm or vascular malformation. Delayed phase: Not performed. Review of the MIP images confirms the above findings CT Brain Perfusion Findings: CBF (<30%) Volume: 0mL Perfusion (Tmax>6.0s) volume: 86mL Mismatch Volume: 86mL Infarction Location:While no core infarct is registered, there is evidence of evolving ischemia within the right insula and frontal operculum on prior noncontrast head CT. This is suspected to be mass by collateralization. Evidence of moderate surrounding penumbra within the right frontal lobe on associated perfusion map. IMPRESSION: 1. 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 Right ICA somewhat diffusely narrow and irregular distally to the skullbase. 2. Positive CTA for emergent large vessel occlusion with right M2 occlusion and evolving right frontal lobe infarct involving the right insula and frontal operculum. Moderate surrounding penumbra as above. 3. Additional mild for age atherosclerotic changes above. No other high-grade or correctable stenosis. Critical Value/emergent results were called by telephone at the time of interpretation on 11/25/2016 at 4:16 pm to Dr. Milon Dikes , who verbally  acknowledged these results. Electronically Signed   By: Rise Mu M.D.   On: 11/25/2016 16:53   Ct Head Wo Contrast  Result Date: 11/25/2016 CLINICAL DATA:  71 year old who woke up this morning with facial droop, left upper extremity weakness and slurred speech. EXAM: CT HEAD WITHOUT CONTRAST TECHNIQUE: Contiguous axial images were obtained from the base of the skull through the vertex without intravenous contrast. COMPARISON:  None. FINDINGS: Brain: Ventricular system normal in size and appearance for age. Mild changes of small vessel disease of the white matter. Asymmetric low attenuation involving the white matter of the right superior temporal lobe extending into the right frontal lobe. No associated hemorrhage or hematoma. No extra-axial fluid collections. No visible mass lesion. No midline shift. Mild cerebellar vermian atrophy. No cortical atrophy. Vascular: Hyperdense right middle cerebral artery. Mild bilateral carotid siphon atherosclerosis. Skull: No skull fracture or other focal osseous abnormality involving the skull. Sinuses/Orbits: Visualized paranasal sinuses, bilateral mastoid air cells and bilateral middle ear cavities well-aerated. Visualized orbits and globes are normal. Other: None. IMPRESSION: 1. 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 Hyperdense right middle cerebral artery indicates acute or subacute occlusion. 2. No associated hemorrhage, hematoma, mass effect or midline shift at this time. I personally telephoned these results at the time of interpretation on 11/25/2016 at 1:51 pm to Dr. Rockne Menghini, who verbally acknowledged these results. Electronically Signed   By: Hulan Saas M.D.   On:  11/25/2016 13:53   Ct Angio Neck W Or Wo Contrast  Result Date: 11/25/2016 CLINICAL DATA:  Initial evaluation for acute dysarthria, stroke. EXAM: CT ANGIOGRAPHY HEAD AND NECK CT PERFUSION BRAIN TECHNIQUE: Multidetector CT imaging of the head and neck  was performed using the standard protocol during bolus administration of intravenous contrast. Multiplanar CT image reconstructions and MIPs were obtained to evaluate the vascular anatomy. Carotid stenosis measurements (when applicable) are obtained utilizing NASCET criteria, using the distal internal carotid diameter as the denominator. Multiphase CT imaging of the brain was performed following IV bolus contrast injection. Subsequent parametric perfusion maps were calculated using RAPID software. CONTRAST:  100 cc of Isovue 370. COMPARISON:  Prior head CT from earlier the same day. 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 No evidence for hemorrhage or mass effect. Overall, this is stable to minimally progressed from scan performed earlier the same day and. No other acute large vessel territory infarct. No acute intracranial hemorrhage. No mass lesion, midline shift or mass effect. No hydrocephalus. No extra-axial fluid collection. Vascular: Focal hyperdensity within distal right M1/proximal right M2 branches, consistent with thrombus. Vascular calcifications noted within the carotid siphons. Skull: Scalp soft tissues and calvarium within normal limits. Sinuses/Orbits: Globes and orbital soft tissues within normal limits. Mild mucosal thickening within the maxillary sinuses. Paranasal sinuses are otherwise clear. No mastoid effusion. ASPECTS [facility] Stroke Program Early CT Score) - Ganglionic level infarction (caudate, lentiform nuclei, internal capsule, insula, M1-M3 cortex): 5 - Supraganglionic infarction (M4-M6 cortex): 2 Total score (0-10 with 10 being normal): 7 Review of the MIP images confirms the above findings CTA NECK FINDINGS Aortic arch: Visualized aortic arch of normal caliber with normal branch pattern. Minimal plaque within the arch itself. No flow-limiting stenosis  about the origin of the great vessels. Visualized subclavian arteries widely patent. Right carotid system: Right common carotid artery widely patent from its origin to the bifurcation. A centric calcified plaque about the right bifurcation/proximal right ICA. There is abrupt near occlusion of the proximal right ICA at the bifurcation (series 12, image 202). A focal string sign is present. Intraluminal filling defect just distally consistent with thrombus. Area of involvement begins at the bifurcation and measures approximately 15 mm in length. A focal ruptured plaque with associated acute dissection is suspected. Distally, the right ICA is somewhat diminutive in caliber and irregular, but is patent to the skullbase without additional focal stenosis or other vascular abnormality. Left carotid system: Left common carotid artery widely patent from its origin to the bifurcation. Minimal plaque about the left bifurcation without stenosis. Left ICA widely patent distally to the skullbase without stenosis, dissection, or occlusion. Vertebral arteries: Left vertebral artery arises separately from the aortic arch. Right vertebral artery rises from the right subclavian artery. Right vertebral artery slightly dominant. Vertebral arteries patent within the neck without stenosis, dissection, or occlusion. Skeleton: No acute osseus abnormality. No worrisome lytic or blastic osseous lesions. Mild to moderate multilevel degenerative spondylolysis within the cervical spine, greatest at C5-6. Other neck: No acute soft tissue abnormality within the neck. Salivary glands normal. No adenopathy. Thyroid normal. Upper chest: Visualized upper chest within normal limits. Visualized lungs are clear. Review of the MIP images confirms the above findings CTA HEAD FINDINGS Anterior circulation: Petrous, cavernous, and supraclinoid segments patent bilaterally without flow-limiting stenosis. Minimal plaque noted within the cavernous ICAs. ICA  termini widely patent. A1  segments patent bilaterally. Anterior communicating artery normal. Anterior cerebral artery is patent to their distal aspects. Right M1 segment patent to its distal aspect. There is occlusion of proximal right M2 branch at the base of the right sylvian fissure (series 12, image 96), likely embolic from thrombus seen in the neck. Right MCA branches demonstrate multifocal irregularity with stenoses distally. Left M1 segment patent to its distal aspect. No proximal left M2 occlusion. Left MCA branches well opacified to their distal aspects. Posterior circulation: Vertebral artery's patent to the vertebrobasilar junction without stenosis. Patent left PICA. Dominant right AICA. Basilar artery widely patent to its distal aspect. Superior cerebral arteries patent bilaterally. PCA supplied via the basilar as well is prominent bilateral posterior communicating arteries. PCAs patent to their distal aspects. Venous sinuses: Patent. Anatomic variants: No significant anatomic variant. No aneurysm or vascular malformation. Delayed phase: Not performed. Review of the MIP images confirms the above findings CT Brain Perfusion Findings: CBF (<30%) Volume: 0mL Perfusion (Tmax>6.0s) volume: 86mL Mismatch Volume: 86mL Infarction Location:While no core infarct is registered, there is evidence of evolving ischemia within the right insula and frontal operculum on prior noncontrast head CT. This is suspected to be mass by collateralization. Evidence of moderate surrounding penumbra within the right frontal lobe on associated perfusion map. IMPRESSION: 1. 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 Right ICA somewhat diffusely narrow and irregular distally to the skullbase. 2. Positive CTA for emergent large vessel occlusion with right M2 occlusion and evolving right frontal  lobe infarct involving the right insula and frontal operculum. Moderate surrounding penumbra as above. 3. Additional mild for age atherosclerotic changes above. No other high-grade or correctable stenosis. Critical Value/emergent results were called by telephone at the time of interpretation on 11/25/2016 at 4:16 pm to Dr. Milon Dikes , who verbally acknowledged these results. Electronically Signed   By: Rise Mu M.D.   On: 11/25/2016 16:53   Ct Cerebral Perfusion W Contrast  Result Date: 11/25/2016 CLINICAL DATA:  Initial evaluation for acute dysarthria, stroke. EXAM: CT ANGIOGRAPHY HEAD AND NECK CT PERFUSION BRAIN TECHNIQUE: Multidetector CT imaging of the head and neck was performed using the standard protocol during bolus administration of intravenous contrast. Multiplanar CT image reconstructions and MIPs were obtained to evaluate the vascular anatomy. Carotid stenosis measurements (when applicable) are obtained utilizing NASCET criteria, using the distal internal carotid diameter as the denominator. Multiphase CT imaging of the brain was performed following IV bolus contrast injection. Subsequent parametric perfusion maps were calculated using RAPID software. CONTRAST:  100 cc of Isovue 370. COMPARISON:  Prior head CT from earlier the same day. 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 No evidence for hemorrhage or mass effect. Overall, this is stable to minimally progressed from scan performed earlier the same day and. No other acute large vessel territory infarct. No acute intracranial hemorrhage. No mass lesion, midline shift or mass effect. No hydrocephalus. No extra-axial fluid collection. Vascular: Focal hyperdensity within distal right M1/proximal right M2 branches, consistent with thrombus. Vascular calcifications noted within the carotid siphons. Skull:  Scalp soft tissues and calvarium within normal limits. Sinuses/Orbits: Globes and orbital soft tissues within normal limits. Mild mucosal thickening within the maxillary sinuses. Paranasal sinuses are otherwise clear. No mastoid effusion. ASPECTS [facility] Stroke Program Early CT Score) -  Ganglionic level infarction (caudate, lentiform nuclei, internal capsule, insula, M1-M3 cortex): 5 - Supraganglionic infarction (M4-M6 cortex): 2 Total score (0-10 with 10 being normal): 7 Review of the MIP images confirms the above findings CTA NECK FINDINGS Aortic arch: Visualized aortic arch of normal caliber with normal branch pattern. Minimal plaque within the arch itself. No flow-limiting stenosis about the origin of the great vessels. Visualized subclavian arteries widely patent. Right carotid system: Right common carotid artery widely patent from its origin to the bifurcation. A centric calcified plaque about the right bifurcation/proximal right ICA. There is abrupt near occlusion of the proximal right ICA at the bifurcation (series 12, image 202). A focal string sign is present. Intraluminal filling defect just distally consistent with thrombus. Area of involvement begins at the bifurcation and measures approximately 15 mm in length. A focal ruptured plaque with associated acute dissection is suspected. Distally, the right ICA is somewhat diminutive in caliber and irregular, but is patent to the skullbase without additional focal stenosis or other vascular abnormality. Left carotid system: Left common carotid artery widely patent from its origin to the bifurcation. Minimal plaque about the left bifurcation without stenosis. Left ICA widely patent distally to the skullbase without stenosis, dissection, or occlusion. Vertebral arteries: Left vertebral artery arises separately from the aortic arch. Right vertebral artery rises from the right subclavian artery. Right vertebral artery slightly dominant. Vertebral arteries patent  within the neck without stenosis, dissection, or occlusion. Skeleton: No acute osseus abnormality. No worrisome lytic or blastic osseous lesions. Mild to moderate multilevel degenerative spondylolysis within the cervical spine, greatest at C5-6. Other neck: No acute soft tissue abnormality within the neck. Salivary glands normal. No adenopathy. Thyroid normal. Upper chest: Visualized upper chest within normal limits. Visualized lungs are clear. Review of the MIP images confirms the above findings CTA HEAD FINDINGS Anterior circulation: Petrous, cavernous, and supraclinoid segments patent bilaterally without flow-limiting stenosis. Minimal plaque noted within the cavernous ICAs. ICA termini widely patent. A1 segments patent bilaterally. Anterior communicating artery normal. Anterior cerebral artery is patent to their distal aspects. Right M1 segment patent to its distal aspect. There is occlusion of proximal right M2 branch at the base of the right sylvian fissure (series 12, image 96), likely embolic from thrombus seen in the neck. Right MCA branches demonstrate multifocal irregularity with stenoses distally. Left M1 segment patent to its distal aspect. No proximal left M2 occlusion. Left MCA branches well opacified to their distal aspects. Posterior circulation: Vertebral artery's patent to the vertebrobasilar junction without stenosis. Patent left PICA. Dominant right AICA. Basilar artery widely patent to its distal aspect. Superior cerebral arteries patent bilaterally. PCA supplied via the basilar as well is prominent bilateral posterior communicating arteries. PCAs patent to their distal aspects. Venous sinuses: Patent. Anatomic variants: No significant anatomic variant. No aneurysm or vascular malformation. Delayed phase: Not performed. Review of the MIP images confirms the above findings CT Brain Perfusion Findings: CBF (<30%) Volume: 0mL Perfusion (Tmax>6.0s) volume: 86mL Mismatch Volume: 86mL Infarction  Location:While no core infarct is registered, there is evidence of evolving ischemia within the right insula and frontal operculum on prior noncontrast head CT. This is suspected to be mass by collateralization. Evidence of moderate surrounding penumbra within the right frontal lobe on associated perfusion map. IMPRESSION: 1. 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 Right ICA somewhat diffusely narrow and irregular distally to the skullbase. 2.  Positive CTA for emergent large vessel occlusion with right M2 occlusion and evolving right frontal lobe infarct involving the right insula and frontal operculum. Moderate surrounding penumbra as above. 3. Additional mild for age atherosclerotic changes above. No other high-grade or correctable stenosis. Critical Value/emergent results were called by telephone at the time of interpretation on 11/25/2016 at 4:16 pm to Dr. Milon DikesASHISH ARORA , who verbally acknowledged these results. Electronically Signed   By: Rise MuBenjamin  McClintock M.D.   On: 11/25/2016 16:53    STUDIES:  CT head 7/23 > Early acute right middle cerebral artery distribution stroke. Hyperdense right middle cerebral artery indicates acute or subacute occlusion. CT angio/perfusion head 7/23 > Short-segment near occlusion of the proximal right ICA with associated intraluminal thrombus as above. Positive CTA for emergent large vessel occlusion with right M2 occlusion and evolving right frontal lobe infarct involving the right insula and frontal operculum. Moderate surrounding penumbra as above. Ct head post op 7/23 > Serpiginous subarachnoid hyperdensity within the right sylvian fissure and overlying right frontal lobe, most consistent with contrast leakage from recent catheter directed arteriogram. Superimposed hemorrhage not entirely excluded. Evolving acute right MCA  territory infarct.  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 Underwent IR procedure complicated by hemorrhage. Transferred to ICU on vent for further monitoring.  ASSESSMENT / PLAN:  PULMONARY A: Inability to protect airway in post-operative setting   P:  Full vent support CXR for ETT placement ABG VAP bundle  CARDIOVASCULAR A:  Hypertension - SBP 200 immediately post op  P:  Telemetry monitoring SBP goal 120-18640mmHg Nicardipine infusion PRN hydralazine  RENAL A:   No acute issues  P:   Repeat BMP in AM  GASTROINTESTINAL A:   No acute issues  P:   NPO GI ppx: IV protonix  HEMATOLOGIC A:   Leukopenia   P:  Follow CBC  INFECTIOUS A:   No acute issues  P:   Follow CBC and fever curve  ENDOCRINE A:   No acute issues  P:   CBG monitoring q 4 Add SSI for consecutive glucose greater than 180   NEUROLOGIC A:   R MCA CVA s/p clot retrieval revascularization  R carotic stenosis/occlusion s/p stented angioplasty SAH post op P:   RASS goal: -1 to -2 Propofol infusion for sedation PRN fentanyl for analgesia [place] service primary CT in AM or with any clinical worsening   FAMILY  - Updates:   - Inter-disciplinary family meet or Palliative Care meeting due by:  7/30   Joneen RoachPaul Hoffman, AGACNP-BC [facility] Pager [phone] or [phone]  11/25/2016 8:22 PM

## 2016-11-25 NOTE — Procedures (Signed)
S/P 4 vessel cerebral arteriogra. Rt CFA approach. Findings. 1.Complete revascularization of occluded  Sup division of RT MCA with x1 pass with 4mm x 40 mm solitaire  FR retrieval device achieving a TICI 2b reperfusion. 2.Revascularization of symptomatic near complete occlusion  Of Rt ICA proximally with stent assisted angioplasty. Loaded with 650 mg of aspirin and 300 mg of plavix via orogastric tube prior  To  stent assisted angioplasty.

## 2016-11-25 NOTE — ED Notes (Signed)
Patient transported to IR by this RN and Shanda BumpsJessica, Charity fundraiserN.

## 2016-11-25 NOTE — ED Triage Notes (Signed)
Per wife pt woke up this morning with facial droop, left arm weakness and slurred speech.  Pt was normal when went to bed last night per wife.  She has had trouble understanding him talk.  No history of stroke.

## 2016-11-25 NOTE — Progress Notes (Signed)
Anesthesia intubating and starting aline at this time. Dr Maple HudsonMoser in room

## 2016-11-25 NOTE — ED Triage Notes (Signed)
Pt to ED from [facility] ED for further evaluation of stroke symptoms. Per EMS, pt had a bad headache at 2am, went to sleep and woke up at 9am with slurred speech and L sided deficits. Pt A&O x 4, weakness noted in L arm, dysarthria.

## 2016-11-25 NOTE — ED Provider Notes (Signed)
[name] Provider Note  ____________________________________________  Time seen: Approximately 1:53 PM  I have reviewed the triage vital signs and the nursing notes.   HISTORY  Chief Complaint Facial Droop    HPI Jason Nolan is a 71 y.o. RH male with a history of hep C presenting for dysarthria, left facial droop, left upper extremity and left lower extremity weakness. The patient reports that he developed a posterior right headache last night before going to bed. This morning when he awoke, he was unable to speak normally, with slurred and garbled speech. In addition, he noted his left upper extremity to be weak, with some minimal weakness in the left lower extremity. No visual or enteral status changes.   Past Medical History:  Diagnosis Date  . Glaucoma   . Hepatitis C     Patient Active Problem List   Diagnosis Date Noted  . Leukopenia 03/31/2016  . Hepatitis C 07/22/2013    History reviewed. No pertinent surgical history.  Current Outpatient Rx  . Order #: [id number] Class: Historical Med  . Order #: [id number] Class: Historical Med  . Order #: [id number] Class: Historical Med  . Order #: [id number] Class: Historical Med    Allergies Patient has no known allergies.  Family History  Problem Relation Age of Onset  . Hypertension Mother   . Hypertension Father     Social History Social History  Substance Use Topics  . Smoking status: Former Games developer  . Smokeless tobacco: Former Neurosurgeon    Quit date: 05/06/1993  . Alcohol use No    Review of Systems Constitutional: No fever/chills.No lightheadedness or syncope. Eyes: No visual changes. No blurred or double vision. ENT: No sore throat. No congestion or rhinorrhea. Cardiovascular: Denies chest pain. Denies palpitations. Respiratory: Denies shortness of breath.  No cough. Gastrointestinal: No abdominal pain.  No nausea, no vomiting.  No diarrhea.  No  constipation. Genitourinary: Negative for dysuria. Musculoskeletal: Negative for back pain. Skin: Negative for rash. Neurological: Positive for headaches. No numbness or tingling. Positive weakness in the left upper extremity and left lower extremity. Positive left facial droop.    ____________________________________________   PHYSICAL EXAM:  VITAL SIGNS: ED Triage Vitals  Enc Vitals Group     BP 11/25/16 1347 (!) 173/85     Pulse Rate 11/25/16 1347 (!) 56     Resp 11/25/16 1347 17     Temp 11/25/16 1347 98.2 F (36.8 C)     Temp Source 11/25/16 1347 Oral     SpO2 11/25/16 1347 97 %     Weight 11/25/16 1338 200 lb (90.7 kg)     Height 11/25/16 1338 5\' 10"  (1.778 m)     Head Circumference --      Peak Flow --      Pain Score --      Pain Loc --      Pain Edu? --      Excl. in GC? --     Constitutional: Alert and oriented x 3. His GCS is 15. He is able to follow commands and answer questions appropriately.  Eyes: Conjunctivae are normal.  EOMI. PERRLA. No scleral icterus. Head: Atraumatic. Nose: No congestion/rhinnorhea. Mouth/Throat: Mucous membranes are moist.  Neck: No stridor.  Supple.  No JVD. No meningismus. Cardiovascular: Normal rate, regular rhythm. No murmurs, rubs or gallops.  Respiratory: Normal respiratory effort.  No accessory muscle use or retractions. Lungs CTAB.  No wheezes, rales or ronchi. Gastrointestinal: Soft, nontender and nondistended.  No guarding  or rebound.  No peritoneal signs. Musculoskeletal: No LE edema. No ttp in the calves or palpable cords.  Negative Homan's sign. Neurologic: Alert and oriented 3. Speech is slurred and garbled. Face and smile asymmetric with a left facial droop and loss of the left nasolabial fold.. Tongue deviates to the right. Positive left-sided pronator drift. 5 out of 5 grip, biceps, triceps, hip flexors, plantar flexion and dorsiflexion in the right side. On the left side, the patient has 4 out of 5 grip strength,  triceps and biceps strength. He has 4 out of 5 left hip flexor strength, dorsiflexion, and 4+ out of 5 plantar flexion.. Normal sensation to light touch in the bilateral upper and lower extremities, and face.  Skin:  Skin is warm, dry and intact. No rash noted. Psychiatric: Mood and affect are normal. Speech and behavior are normal.  Normal judgement.  ____________________________________________   LABS (all labs ordered are listed, but only abnormal results are displayed)  Labs Reviewed  CBC - Abnormal; Notable for the following:       Result Value   WBC 3.1 (*)    All other components within normal limits  DIFFERENTIAL  COMPREHENSIVE METABOLIC PANEL  TROPONIN I  GLUCOSE, CAPILLARY  PROTIME-INR  APTT  RAPID URINE DRUG SCREEN, HOSP PERFORMED  URINALYSIS, ROUTINE W REFLEX MICROSCOPIC  CBG MONITORING, ED   ____________________________________________  EKG  ED ECG REPORT I, Rockne MenghiniNorman, Anne-[name], the attending physician, personally viewed and interpreted this ECG.   Date: 11/25/2016  EKG Time: 1345  Rate: 67  Rhythm: normal sinus rhythm  Axis: leftward  Intervals:none  ST&T Change: No STEMI  ____________________________________________  RADIOLOGY  Ct Head Wo Contrast  Result Date: 11/25/2016 CLINICAL DATA:  71 year old who woke up this morning with facial droop, left upper extremity weakness and slurred speech. EXAM: CT HEAD WITHOUT CONTRAST TECHNIQUE: Contiguous axial images were obtained from the base of the skull through the vertex without intravenous contrast. COMPARISON:  None. FINDINGS: Brain: Ventricular system normal in size and appearance for age. Mild changes of small vessel disease of the white matter. Asymmetric low attenuation involving the white matter of the right superior temporal lobe extending into the right frontal lobe. No associated hemorrhage or hematoma. No extra-axial fluid collections. No visible mass lesion. No midline shift. Mild cerebellar vermian  atrophy. No cortical atrophy. Vascular: Hyperdense right middle cerebral artery. Mild bilateral carotid siphon atherosclerosis. Skull: No skull fracture or other focal osseous abnormality involving the skull. Sinuses/Orbits: Visualized paranasal sinuses, bilateral mastoid air cells and bilateral middle ear cavities well-aerated. Visualized orbits and globes are normal. Other: None. IMPRESSION: 1. 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 Hyperdense right middle cerebral artery indicates acute or subacute occlusion. 2. No associated hemorrhage, hematoma, mass effect or midline shift at this time. I personally telephoned these results at the time of interpretation on 11/25/2016 at 1:51 pm to Dr. Rockne MenghiniANNE-[name] Alyx Gee, who verbally acknowledged these results. Electronically Signed   By: Hulan Saashomas  Lawrence M.D.   On: 11/25/2016 13:53    ____________________________________________   PROCEDURES  Procedure(s) performed: None  Procedures  Critical Care performed: Yes, see critical care note(s) ____________________________________________   INITIAL IMPRESSION / ASSESSMENT AND PLAN / ED COURSE  Pertinent labs & imaging results that were available during my care of the patient were reviewed by me and considered in my medical decision making (see chart for details).  71 y.o. male presenting with headache, left upper extremity and left lower 70 weakness, left facial droop, tongue  deviation, with dysarthria. Overall, I'm is most concerned about an MCA stroke in this patient. His CT scan does show a density that is most consistent with acute stroke. The patient is not a candidate for intravenous TPA given the onset time of his symptoms, however, Dr. Thad Ranger the neurologist on call is immediately evaluating the patient to see if he would be a candidate for intra-arterial TPA at an outside institution. At this time, the patient is hemodynamically stable with a reasonable blood pressure in the  170s, and there is no indication to drop his blood pressure. Plan reevaluation for final disposition.  ----------------------------------------- 2:21 PM on 11/25/2016 -----------------------------------------  The patient has been seen and evaluated by Dr. Thad Ranger, the neurologist on-call. In addition, his imaging and history have been discussed with the neurologist at [facility]. The patient has been accepted for transfer, for reevaluation of possible intra-arterial intervention depending on his results. The patient has received an aspirin rectally, as he is not able to swallow safely. The ambulance is in route and will come to pick up the patient shortly.  CRITICAL CARE Performed by: Rockne Menghini   Total critical care time: 40 minutes  Critical care time was exclusive of separately billable procedures and treating other patients.  Critical care was necessary to treat or prevent imminent or life-threatening deterioration.  Critical care was time spent personally by me on the following activities: development of treatment plan with patient and/or surrogate as well as nursing, discussions with consultants, evaluation of patient's response to treatment, examination of patient, obtaining history from patient or surrogate, ordering and performing treatments and interventions, ordering and review of laboratory studies, ordering and review of radiographic studies, pulse oximetry and re-evaluation of patient's condition.   ____________________________________________  FINAL CLINICAL IMPRESSION(S) / ED DIAGNOSES  Final diagnoses:  Acute ischemic left MCA stroke (HCC)         NEW MEDICATIONS STARTED DURING THIS VISIT:  New Prescriptions   No medications on file      Rockne Menghini, MD 11/25/16 1422

## 2016-11-25 NOTE — Progress Notes (Signed)
Anesthesia, Lelon MastSamantha, CRNA present for case

## 2016-11-25 NOTE — Progress Notes (Signed)
Patient ID: Jason Nolan, male   DOB: [birth year], 71 y.o.   MRN: [id number] LSW at 2am. Woke  up this am with Lt sided weakness and slurred speech NIH 6 at the hospital. mRSscore 0 preictal.. CT hyperdense Rt MCA sign in the sylvian fissure.ASPECTS 8 CT no ICH. CTA ?RT MCA sup division  occlusion. CP CBF<30 % vol 0ml. Tmax. > 6.0s vol 86ml. Mismatch of 86 ml  Option of endovascular revascularization to prevent further neurological injury discussed with patient and wife. Reasons ,risks reviewed.Risks of ICH of 10%,worsening neuro status ,vent dependency,death and inability to revascularize discussed. Questions answered. Informed witnessed consent obtained for endovascular revascularization under.GA. S.Sharita Bienaime MD.

## 2016-11-25 NOTE — Progress Notes (Signed)
Dr Corliss Skainseveshwar in room to continue to intervention

## 2016-11-25 NOTE — ED Provider Notes (Signed)
Patient seen at [facility] emergency Department for left upper and lower extremity weakness and left facial droop. Awaken this morning unable to speak normally with garbled speech. He was sent here for further evaluation and possible intervention by stroke service   Doug SouJacubowitz, Juma Oxley, MD 11/25/16 2354

## 2016-11-25 NOTE — Progress Notes (Signed)
Report called to receiving nurse, Loraine LericheMark, RN, [phone]. All questions answered.

## 2016-11-25 NOTE — Progress Notes (Signed)
Pt in IR, under the care of anesthesia. Pt sedated

## 2016-11-25 NOTE — Transfer of Care (Signed)
Immediate Anesthesia Transfer of Care Note  Patient: Jason Nolan  Procedure(s) Performed: Procedure(s): RADIOLOGY WITH ANESTHESIA (N/A)  Patient Location: ICU  Anesthesia Type:General  Level of Consciousness: sedated and Patient remains intubated per anesthesia plan  Airway & Oxygen Therapy: Patient remains intubated per anesthesia plan and Patient placed on Ventilator (see vital sign flow sheet for setting)  Post-op Assessment: Report given to RN  Post vital signs: Reviewed and stable  Last Vitals:  Vitals:   11/25/16 1600 11/25/16 2005  BP: (!) 174/86   Pulse: (!) 56   Resp: 10 16  Temp:      Last Pain:  Vitals:   11/25/16 1557  TempSrc: Oral  PainSc: 0-No pain         Complications: No apparent anesthesia complications

## 2016-11-25 NOTE — ED Notes (Signed)
EMTALA  Transfer reviewed

## 2016-11-25 NOTE — Code Documentation (Signed)
71[facility] at [phone].  [facility] with left facial droop, left arm weakness and slurred speech.  Patient went to bed at 0200 and woke up at 0900 with symptoms.  [facility] showing early acute right MCA distribution stroke and hyperdense right MCA indicates acute or subacute occlusion per report.  [facility]t concern for LVO.  Stroke team to the bedside.  NIHSS 4, see documentation for details and code stroke times.  Patient with left facial droop and dysarthria on exam.  Attempt to place 18G PIV x 2 by ED RN.  ED resident placed 18G PIV with ultrasound.  Patient to CT for CTA head and neck and CTP.  On reassessment, patient with LUE drift.  Patient to IR at 1616 for catheter angiogram.  Bedside handoff with IR team.

## 2016-11-25 NOTE — Anesthesia Preprocedure Evaluation (Addendum)
Anesthesia Evaluation  Patient identified by MRN, date of birth, ID band Patient awake    Reviewed: Allergy & Precautions, NPO status , Patient's Chart, lab work & pertinent test results, Unable to perform ROS - Chart review onlyPreop documentation limited or incomplete due to emergent nature of procedure.  Airway Mallampati: III  TM Distance: >3 FB Neck ROM: Full    Dental  (+) Teeth Intact   Pulmonary former smoker,    breath sounds clear to auscultation- rhonchi       Cardiovascular negative cardio ROS   Rhythm:Regular     Neuro/Psych Acute CVA symptoms (slurred speech), last seen normal 0900 7/23 CVA, Residual Symptoms    GI/Hepatic negative GI ROS, (+) Hepatitis -, C  Endo/Other  negative endocrine ROS  Renal/GU negative Renal ROS     Musculoskeletal negative musculoskeletal ROS (+)   Abdominal   Peds  Hematology negative hematology ROS (+)   Anesthesia Other Findings   Reproductive/Obstetrics                            Anesthesia Physical Anesthesia Plan  ASA: III and emergent  Anesthesia Plan: MAC   Post-op Pain Management:    Induction:   PONV Risk Score and Plan: 1 and Ondansetron and Treatment may vary due to age or medical condition  Airway Management Planned: Nasal Cannula  Additional Equipment:   Intra-op Plan:   Post-operative Plan:   Informed Consent: I have reviewed the patients History and Physical, chart, labs and discussed the procedure including the risks, benefits and alternatives for the proposed anesthesia with the patient or authorized representative who has indicated his/her understanding and acceptance.   Only emergency history available and History available from chart only  Plan Discussed with: CRNA and Surgeon  Anesthesia Plan Comments:         Anesthesia Quick Evaluation

## 2016-11-26 ENCOUNTER — Inpatient Hospital Stay (HOSPITAL_COMMUNITY): Payer: Medicare Other

## 2016-11-26 ENCOUNTER — Encounter (HOSPITAL_COMMUNITY): Payer: Self-pay | Admitting: Radiology

## 2016-11-26 DIAGNOSIS — I63511 Cerebral infarction due to unspecified occlusion or stenosis of right middle cerebral artery: Secondary | ICD-10-CM

## 2016-11-26 DIAGNOSIS — I10 Essential (primary) hypertension: Secondary | ICD-10-CM

## 2016-11-26 DIAGNOSIS — J9601 Acute respiratory failure with hypoxia: Secondary | ICD-10-CM

## 2016-11-26 DIAGNOSIS — I63411 Cerebral infarction due to embolism of right middle cerebral artery: Secondary | ICD-10-CM

## 2016-11-26 DIAGNOSIS — R1312 Dysphagia, oropharyngeal phase: Secondary | ICD-10-CM

## 2016-11-26 DIAGNOSIS — I639 Cerebral infarction, unspecified: Secondary | ICD-10-CM

## 2016-11-26 DIAGNOSIS — G9340 Encephalopathy, unspecified: Secondary | ICD-10-CM

## 2016-11-26 LAB — BASIC METABOLIC PANEL
Anion gap: 6 (ref 5–15)
BUN: 8 mg/dL (ref 6–20)
CHLORIDE: 110 mmol/L (ref 101–111)
CO2: 23 mmol/L (ref 22–32)
CREATININE: 0.99 mg/dL (ref 0.61–1.24)
Calcium: 7.9 mg/dL — ABNORMAL LOW (ref 8.9–10.3)
GFR calc Af Amer: >60 mL/min (ref >60)
GFR calc non Af Amer: >60 mL/min (ref >60)
GLUCOSE: 109 mg/dL — AB (ref 65–99)
Potassium: 3.9 mmol/L (ref 3.5–5.1)
SODIUM: 139 mmol/L (ref 135–145)

## 2016-11-26 LAB — VAS US CAROTID
LCCADDIAS: -37 cm/s
LCCADSYS: -203 cm/s
LCCAPSYS: 269 cm/s
LEFT ECA DIAS: -18 cm/s
LEFT VERTEBRAL DIAS: -14 cm/s
LICADDIAS: -34 cm/s
LICADSYS: -102 cm/s
Left CCA prox dias: 45 cm/s
Left ICA prox dias: -39 cm/s
Left ICA prox sys: -247 cm/s
RCCADSYS: -82 cm/s
RIGHT CCA MID DIAS: -31 cm/s
RIGHT ECA DIAS: -37 cm/s
RIGHT VERTEBRAL DIAS: -17 cm/s
Right CCA prox dias: 25 cm/s
Right CCA prox sys: 173 cm/s

## 2016-11-26 LAB — ECHOCARDIOGRAM COMPLETE
Height: 70 in
Weight: 3418.01 oz

## 2016-11-26 LAB — CBC WITH DIFFERENTIAL/PLATELET
Basophils Absolute: 0 10*3/uL (ref 0.0–0.1)
Basophils Relative: 0 %
EOS ABS: 0.1 10*3/uL (ref 0.0–0.7)
Eosinophils Relative: 1 %
HEMATOCRIT: 42 % (ref 39.0–52.0)
Hemoglobin: 13.9 g/dL (ref 13.0–17.0)
LYMPHS ABS: 1.3 10*3/uL (ref 0.7–4.0)
Lymphocytes Relative: 29 %
MCH: 30.2 pg (ref 26.0–34.0)
MCHC: 33.1 g/dL (ref 30.0–36.0)
MCV: 91.3 fL (ref 78.0–100.0)
MONOS PCT: 5 %
Monocytes Absolute: 0.2 10*3/uL (ref 0.1–1.0)
NEUTROS PCT: 65 %
Neutro Abs: 2.9 10*3/uL (ref 1.7–7.7)
Platelets: 185 10*3/uL (ref 150–400)
RBC: 4.6 MIL/uL (ref 4.22–5.81)
RDW: 13.8 % (ref 11.5–15.5)
WBC: 4.5 10*3/uL (ref 4.0–10.5)

## 2016-11-26 LAB — PLATELET INHIBITION P2Y12: PLATELET FUNCTION P2Y12: 252 [PRU] (ref 194–418)

## 2016-11-26 LAB — LIPID PANEL
Cholesterol: 193 mg/dL (ref 0–200)
HDL: 57 mg/dL (ref >40)
LDL CALC: 97 mg/dL (ref 0–99)
Total CHOL/HDL Ratio: 3.4 RATIO
Triglycerides: 197 mg/dL — ABNORMAL HIGH (ref <150)
VLDL: 39 mg/dL (ref 0–40)

## 2016-11-26 LAB — CREATININE, SERUM: Creatinine, Ser: 1.03 mg/dL (ref 0.61–1.24)

## 2016-11-26 LAB — MRSA PCR SCREENING: MRSA by PCR: NEGATIVE

## 2016-11-26 LAB — TRIGLYCERIDES: Triglycerides: 181 mg/dL — ABNORMAL HIGH (ref <150)

## 2016-11-26 NOTE — Progress Notes (Signed)
S/O: No events since procedure  BP 139/62   Pulse (!) 59   Temp 98.4 F (36.9 C) (Oral)   Resp 16   Ht 5\' 10"  (1.778 m)   Wt 96.9 kg (213 lb 10 oz)   SpO2 100%   BMI 30.65 kg/m   Propofol briefly held for follow up neurological exam. PERRL. Mild exotropia. Able to direct gaze to right and hesitantly to left. Able to move RUE and RLE. 1/5 motor strength of LUE and LLE.   A/R: 1. Deficits most likely to combined ischemia as well as subarachnoid hemorrhage seen on post-procedure CT 2. Continue supportive care. Resume propofol sedation.   Electronically signed: Dr. Caryl PinaEric Dorthey Depace

## 2016-11-26 NOTE — Progress Notes (Signed)
Rt transported pt from 4N29 to MRI on ventilator. Pt stable throughout with no complications. VS within normal limits. RT will continue to monitor.

## 2016-11-26 NOTE — Progress Notes (Signed)
OT Cancellation Note  Patient Details Name: Jason CroakJames Shadowens MRN: [id number] DOB: November 15, 1945   Cancelled Treatment:    Reason Eval/Treat Not Completed: Patient not medically ready. Active bed rest orders. Will wait until increase in activity orders prior to OT eval.  Evette GeorgesLeonard, [name] [name] 11/26/2016, 7:50 AM

## 2016-11-26 NOTE — Progress Notes (Signed)
PT Cancellation Note  Patient Details Name: Jason Nolan MRN: [id number] DOB: January 20, 1946   Cancelled Treatment:    Reason Eval/Treat Not Completed: Patient not medically ready Active bed rest orders. Will wait until increase in activity orders prior to PT eval.  Thanks!    Jason Nolan, [place] [facility] [phone]   Jason Nolan 11/26/2016, 7:43 AM

## 2016-11-26 NOTE — Progress Notes (Signed)
  Echocardiogram 2D Echocardiogram has been performed.  Jason Nolan, Jason Nolan R 11/26/2016, 10:01 AM

## 2016-11-26 NOTE — Progress Notes (Signed)
PULMONARY / CRITICAL CARE MEDICINE   Name: Jason Nolan MRN: [id number] DOB: 04-25-1946    ADMISSION DATE:  11/25/2016 CONSULTATION DATE:  7/23  REFERRING MD:  Dr. Pearlean Brownie  CHIEF COMPLAINT:  CVA  HISTORY OF PRESENT ILLNESS:  Patient is encephalopathic and/or intubated. Therefore history has been obtained from chart review.  71 year old male with PMH as below, which is significant for Hepatitic C s/p treatment with Harvoni. He was last known normal at 2am on 7/23 when he went to sleep. He woke up around 9am with left sided weakness, facial droop, and slurred speech. 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 He was transferred to [facility] for IR evaluation. He underwent successful revascularization of R MCA and R ICA. Post-procedurally he remained on ventilator and was transferred to ICU. PCCM asked to assist with ventilatory management and ICU care.   SUBJECTIVE:  Woke up with [facility] and was interactive, no further events overnight  VITAL SIGNS: BP (!) 169/70   Pulse (!) 53   Temp (!) 100.5 F (38.1 C) (Axillary) Comment: tykenol given  Resp 11   Ht 5\' 10"  (1.778 m)   Wt 96.9 kg (213 lb 10 oz)   SpO2 100%   BMI 30.65 kg/m   HEMODYNAMICS:    VENTILATOR SETTINGS: Vent Mode: PSV;CPAP FiO2 (%):  [40 %-100 %] 40 % Set Rate:  [16 bmp] 16 bmp Vt Set:  [580 mL] 580 mL PEEP:  [5 cmH20] 5 cmH20 Pressure Support:  [12 cmH20] 12 cmH20 Plateau Pressure:  [14 cmH20-15 cmH20] 14 cmH20  INTAKE / OUTPUT: I/O last 3 completed shifts: In: 1865 [I.V.:1865] Out: 2145 [Urine:2125; Blood:20]  PHYSICAL EXAMINATION: General:  Adult male of normal body habitus in NAD Neuro:  Sedate but weak on the left HEENT:  [place]/AT, PERRL, no JVD. EOM-I and MMM Cardiovascular:  RRR, no MRG Lungs:  CTA bilaterally Abdomen:  Soft, NT, ND and +BS Musculoskeletal:  No acute deformity or ROM limitation Skin:  Grossly intact  LABS:  BMET  Recent Labs Lab  11/25/16 0116 11/25/16 1347 11/26/16 0420  NA  --  136 139  K  --  4.1 3.9  CL  --  104 110  CO2  --  24 23  BUN  --  14 8  CREATININE 1.03 1.01 0.99  GLUCOSE  --  98 109*    Electrolytes  Recent Labs Lab 11/25/16 1347 11/26/16 0420  CALCIUM 9.3 7.9*    CBC  Recent Labs Lab 11/25/16 1347 11/26/16 0420  WBC 3.1* 4.5  HGB 15.4 13.9  HCT 46.0 42.0  PLT 188 185    Coag's  Recent Labs Lab 11/25/16 1347  APTT 33  INR 1.04    Sepsis Markers No results for input(s): LATICACIDVEN, PROCALCITON, O2SATVEN in the last 168 hours.  ABG  Recent Labs Lab 11/25/16 2100  PHART 7.364  PCO2ART 39.0  PO2ART 195*    Liver Enzymes  Recent Labs Lab 11/25/16 1347  AST 25  ALT 23  ALKPHOS 52  BILITOT 0.8  ALBUMIN 4.4    Cardiac Enzymes  Recent Labs Lab 11/25/16 1347  TROPONINI <0.03    Glucose  Recent Labs Lab 11/25/16 1352  GLUCAP 88    Imaging Ct Angio Head W Or Wo Contrast  Result Date: 11/25/2016 CLINICAL DATA:  Initial evaluation for acute dysarthria, stroke. EXAM: CT ANGIOGRAPHY HEAD AND NECK CT PERFUSION BRAIN TECHNIQUE: Multidetector CT imaging  of the head and neck was performed using the standard protocol during bolus administration of intravenous contrast. Multiplanar CT image reconstructions and MIPs were obtained to evaluate the vascular anatomy. Carotid stenosis measurements (when applicable) are obtained utilizing NASCET criteria, using the distal internal carotid diameter as the denominator. Multiphase CT imaging of the brain was performed following IV bolus contrast injection. Subsequent parametric perfusion maps were calculated using RAPID software. CONTRAST:  100 cc of Isovue 370. COMPARISON:  Prior head CT from earlier the same day. 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 No evidence for hemorrhage or  mass effect. Overall, this is stable to minimally progressed from scan performed earlier the same day and. No other acute large vessel territory infarct. No acute intracranial hemorrhage. No mass lesion, midline shift or mass effect. No hydrocephalus. No extra-axial fluid collection. Vascular: Focal hyperdensity within distal right M1/proximal right M2 branches, consistent with thrombus. Vascular calcifications noted within the carotid siphons. Skull: Scalp soft tissues and calvarium within normal limits. Sinuses/Orbits: Globes and orbital soft tissues within normal limits. Mild mucosal thickening within the maxillary sinuses. Paranasal sinuses are otherwise clear. No mastoid effusion. ASPECTS [facility] Stroke Program Early CT Score) - Ganglionic level infarction (caudate, lentiform nuclei, internal capsule, insula, M1-M3 cortex): 5 - Supraganglionic infarction (M4-M6 cortex): 2 Total score (0-10 with 10 being normal): 7 Review of the MIP images confirms the above findings CTA NECK FINDINGS Aortic arch: Visualized aortic arch of normal caliber with normal branch pattern. Minimal plaque within the arch itself. No flow-limiting stenosis about the origin of the great vessels. Visualized subclavian arteries widely patent. Right carotid system: Right common carotid artery widely patent from its origin to the bifurcation. A centric calcified plaque about the right bifurcation/proximal right ICA. There is abrupt near occlusion of the proximal right ICA at the bifurcation (series 12, image 202). A focal string sign is present. Intraluminal filling defect just distally consistent with thrombus. Area of involvement begins at the bifurcation and measures approximately 15 mm in length. A focal ruptured plaque with associated acute dissection is suspected. Distally, the right ICA is somewhat diminutive in caliber and irregular, but is patent to the skullbase without additional focal stenosis or other vascular abnormality. Left  carotid system: Left common carotid artery widely patent from its origin to the bifurcation. Minimal plaque about the left bifurcation without stenosis. Left ICA widely patent distally to the skullbase without stenosis, dissection, or occlusion. Vertebral arteries: Left vertebral artery arises separately from the aortic arch. Right vertebral artery rises from the right subclavian artery. Right vertebral artery slightly dominant. Vertebral arteries patent within the neck without stenosis, dissection, or occlusion. Skeleton: No acute osseus abnormality. No worrisome lytic or blastic osseous lesions. Mild to moderate multilevel degenerative spondylolysis within the cervical spine, greatest at C5-6. Other neck: No acute soft tissue abnormality within the neck. Salivary glands normal. No adenopathy. Thyroid normal. Upper chest: Visualized upper chest within normal limits. Visualized lungs are clear. Review of the MIP images confirms the above findings CTA HEAD FINDINGS Anterior circulation: Petrous, cavernous, and supraclinoid segments patent bilaterally without flow-limiting stenosis. Minimal plaque noted within the cavernous ICAs. ICA termini widely patent. A1 segments patent bilaterally. Anterior communicating artery normal. Anterior cerebral artery is patent to their distal aspects. Right M1 segment patent to its distal aspect. There is occlusion of proximal right M2 branch at the base of the right  sylvian fissure (series 12, image 96), likely embolic from thrombus seen in the neck. Right MCA branches demonstrate multifocal irregularity with stenoses distally. Left M1 segment patent to its distal aspect. No proximal left M2 occlusion. Left MCA branches well opacified to their distal aspects. Posterior circulation: Vertebral artery's patent to the vertebrobasilar junction without stenosis. Patent left PICA. Dominant right AICA. Basilar artery widely patent to its distal aspect. Superior cerebral arteries patent  bilaterally. PCA supplied via the basilar as well is prominent bilateral posterior communicating arteries. PCAs patent to their distal aspects. Venous sinuses: Patent. Anatomic variants: No significant anatomic variant. No aneurysm or vascular malformation. Delayed phase: Not performed. Review of the MIP images confirms the above findings CT Brain Perfusion Findings: CBF (<30%) Volume: 0mL Perfusion (Tmax>6.0s) volume: 86mL Mismatch Volume: 86mL Infarction Location:While no core infarct is registered, there is evidence of evolving ischemia within the right insula and frontal operculum on prior noncontrast head CT. This is suspected to be mass by collateralization. Evidence of moderate surrounding penumbra within the right frontal lobe on associated perfusion map. IMPRESSION: 1. 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 Right ICA somewhat diffusely narrow and irregular distally to the skullbase. 2. Positive CTA for emergent large vessel occlusion with right M2 occlusion and evolving right frontal lobe infarct involving the right insula and frontal operculum. Moderate surrounding penumbra as above. 3. Additional mild for age atherosclerotic changes above. No other high-grade or correctable stenosis. Critical Value/emergent results were called by telephone at the time of interpretation on 11/25/2016 at 4:16 pm to Dr. Milon Dikes , who verbally acknowledged these results. Electronically Signed   By: Rise Mu M.D.   On: 11/25/2016 16:53   Ct Head Wo Contrast  Result Date: 11/25/2016 CLINICAL DATA:  Follow-up exam status post catheter directed thrombectomy. EXAM: CT HEAD WITHOUT CONTRAST TECHNIQUE: Contiguous axial images were obtained from the base of the skull through the vertex without intravenous contrast. COMPARISON:  Prior CTs from earlier the same day.  FINDINGS: Brain: Serpiginous hyperdensity seen layering throughout the right sylvian fissure and multiple cortical sulci of the overlying right frontal lobe, likely reflecting contrast leakage from recent arteriogram. Superimposed hemorrhage not excluded. Evolving hypodensity within the right insula an overlying operculum and right frontal lobe consistent with right MCA territory stroke. Overall, this has slightly evolved from previous. No other acute intracranial hemorrhage. No other acute large vessel territory infarct. No mass lesion or midline shift. No hydrocephalus. No extra-axial fluid collection. Vascular: Diffuse hyperdensity throughout the intracranial vasculature, like related IV contrast. Skull: Scalp soft tissues and calvarium within normal limits. Sinuses/Orbits: Globes and orbital soft tissues within normal limits. Scattered mucosal thickening within the ethmoidal air cells, sphenoid sinuses, and maxillary sinuses. Mastoid air cells remain clear. IMPRESSION: 1. Serpiginous subarachnoid hyperdensity within the right sylvian fissure and overlying right frontal lobe, most consistent with contrast leakage from recent catheter directed arteriogram. Superimposed hemorrhage not entirely excluded. Attention at follow-up recommended. 2. Evolving acute right MCA territory infarct. Electronically Signed   By: Rise Mu M.D.   On: 11/25/2016 20:27   Ct Head Wo Contrast  Result Date: 11/25/2016 CLINICAL DATA:  71 year old who woke up this morning with facial droop, left upper extremity weakness and slurred speech. EXAM: CT HEAD WITHOUT CONTRAST TECHNIQUE: Contiguous axial images were obtained from the base of the skull through the vertex without intravenous contrast. COMPARISON:  None. FINDINGS:  Brain: Ventricular system normal in size and appearance for age. Mild changes of small vessel disease of the white matter. Asymmetric low attenuation involving the white matter of the right superior temporal  lobe extending into the right frontal lobe. No associated hemorrhage or hematoma. No extra-axial fluid collections. No visible mass lesion. No midline shift. Mild cerebellar vermian atrophy. No cortical atrophy. Vascular: Hyperdense right middle cerebral artery. Mild bilateral carotid siphon atherosclerosis. Skull: No skull fracture or other focal osseous abnormality involving the skull. Sinuses/Orbits: Visualized paranasal sinuses, bilateral mastoid air cells and bilateral middle ear cavities well-aerated. Visualized orbits and globes are normal. Other: None. IMPRESSION: 1. 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 Hyperdense right middle cerebral artery indicates acute or subacute occlusion. 2. No associated hemorrhage, hematoma, mass effect or midline shift at this time. I personally telephoned these results at the time of interpretation on 11/25/2016 at 1:51 pm to Dr. Rockne Menghini, who verbally acknowledged these results. Electronically Signed   By: Hulan Saas M.D.   On: 11/25/2016 13:53   Ct Angio Neck W Or Wo Contrast  Result Date: 11/25/2016 CLINICAL DATA:  Initial evaluation for acute dysarthria, stroke. EXAM: CT ANGIOGRAPHY HEAD AND NECK CT PERFUSION BRAIN TECHNIQUE: Multidetector CT imaging of the head and neck was performed using the standard protocol during bolus administration of intravenous contrast. Multiplanar CT image reconstructions and MIPs were obtained to evaluate the vascular anatomy. Carotid stenosis measurements (when applicable) are obtained utilizing NASCET criteria, using the distal internal carotid diameter as the denominator. Multiphase CT imaging of the brain was performed following IV bolus contrast injection. Subsequent parametric perfusion maps were calculated using RAPID software. CONTRAST:  100 cc of Isovue 370. COMPARISON:  Prior head CT from earlier the same day. FINDINGS: CT HEAD FINDINGS Brain: Evolving acute ischemic right MCA territory  infarct involving the right insular cortex and right frontal operculum (series 6, images 15, 19). No evidence for hemorrhage or mass effect. Overall, this is stable to minimally progressed from scan performed earlier the same day and. No other acute large vessel territory infarct. No acute intracranial hemorrhage. No mass lesion, midline shift or mass effect. No hydrocephalus. No extra-axial fluid collection. Vascular: Focal hyperdensity within distal right M1/proximal right M2 branches, consistent with thrombus. Vascular calcifications noted within the carotid siphons. Skull: Scalp soft tissues and calvarium within normal limits. Sinuses/Orbits: Globes and orbital soft tissues within normal limits. Mild mucosal thickening within the maxillary sinuses. Paranasal sinuses are otherwise clear. No mastoid effusion. ASPECTS [facility] Stroke Program Early CT Score) - Ganglionic level infarction (caudate, lentiform nuclei, internal capsule, insula, M1-M3 cortex): 5 - Supraganglionic infarction (M4-M6 cortex): 2 Total score (0-10 with 10 being normal): 7 Review of the MIP images confirms the above findings CTA NECK FINDINGS Aortic arch: Visualized aortic arch of normal caliber with normal branch pattern. Minimal plaque within the arch itself. No flow-limiting stenosis about the origin of the great vessels. Visualized subclavian arteries widely patent. Right carotid system: Right common carotid artery widely patent from its origin to the bifurcation. A centric calcified plaque about the right bifurcation/proximal right ICA. There is abrupt near occlusion of the proximal right ICA at the bifurcation (series 12, image 202). A focal string sign is present. Intraluminal filling defect just distally consistent with thrombus. Area of involvement begins at the bifurcation and measures approximately 15 mm in length. A focal ruptured plaque with associated acute dissection is suspected. Distally, the right ICA is somewhat diminutive  in caliber and irregular,  but is patent to the skullbase without additional focal stenosis or other vascular abnormality. Left carotid system: Left common carotid artery widely patent from its origin to the bifurcation. Minimal plaque about the left bifurcation without stenosis. Left ICA widely patent distally to the skullbase without stenosis, dissection, or occlusion. Vertebral arteries: Left vertebral artery arises separately from the aortic arch. Right vertebral artery rises from the right subclavian artery. Right vertebral artery slightly dominant. Vertebral arteries patent within the neck without stenosis, dissection, or occlusion. Skeleton: No acute osseus abnormality. No worrisome lytic or blastic osseous lesions. Mild to moderate multilevel degenerative spondylolysis within the cervical spine, greatest at C5-6. Other neck: No acute soft tissue abnormality within the neck. Salivary glands normal. No adenopathy. Thyroid normal. Upper chest: Visualized upper chest within normal limits. Visualized lungs are clear. Review of the MIP images confirms the above findings CTA HEAD FINDINGS Anterior circulation: Petrous, cavernous, and supraclinoid segments patent bilaterally without flow-limiting stenosis. Minimal plaque noted within the cavernous ICAs. ICA termini widely patent. A1 segments patent bilaterally. Anterior communicating artery normal. Anterior cerebral artery is patent to their distal aspects. Right M1 segment patent to its distal aspect. There is occlusion of proximal right M2 branch at the base of the right sylvian fissure (series 12, image 96), likely embolic from thrombus seen in the neck. Right MCA branches demonstrate multifocal irregularity with stenoses distally. Left M1 segment patent to its distal aspect. No proximal left M2 occlusion. Left MCA branches well opacified to their distal aspects. Posterior circulation: Vertebral artery's patent to the vertebrobasilar junction without stenosis.  Patent left PICA. Dominant right AICA. Basilar artery widely patent to its distal aspect. Superior cerebral arteries patent bilaterally. PCA supplied via the basilar as well is prominent bilateral posterior communicating arteries. PCAs patent to their distal aspects. Venous sinuses: Patent. Anatomic variants: No significant anatomic variant. No aneurysm or vascular malformation. Delayed phase: Not performed. Review of the MIP images confirms the above findings CT Brain Perfusion Findings: CBF (<30%) Volume: 0mL Perfusion (Tmax>6.0s) volume: 86mL Mismatch Volume: 86mL Infarction Location:While no core infarct is registered, there is evidence of evolving ischemia within the right insula and frontal operculum on prior noncontrast head CT. This is suspected to be mass by collateralization. Evidence of moderate surrounding penumbra within the right frontal lobe on associated perfusion map. IMPRESSION: 1. 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 Right ICA somewhat diffusely narrow and irregular distally to the skullbase. 2. Positive CTA for emergent large vessel occlusion with right M2 occlusion and evolving right frontal lobe infarct involving the right insula and frontal operculum. Moderate surrounding penumbra as above. 3. Additional mild for age atherosclerotic changes above. No other high-grade or correctable stenosis. Critical Value/emergent results were called by telephone at the time of interpretation on 11/25/2016 at 4:16 pm to Dr. Milon Dikes , who verbally acknowledged these results. Electronically Signed   By: Rise Mu M.D.   On: 11/25/2016 16:53   Ct Cerebral Perfusion W Contrast  Result Date: 11/25/2016 CLINICAL DATA:  Initial evaluation for acute dysarthria, stroke. EXAM: CT ANGIOGRAPHY HEAD AND NECK CT PERFUSION BRAIN TECHNIQUE: Multidetector CT  imaging of the head and neck was performed using the standard protocol during bolus administration of intravenous contrast. Multiplanar CT image reconstructions and MIPs were obtained to evaluate the vascular anatomy. Carotid stenosis measurements (when applicable) are obtained utilizing NASCET criteria, using the distal internal carotid  diameter as the denominator. Multiphase CT imaging of the brain was performed following IV bolus contrast injection. Subsequent parametric perfusion maps were calculated using RAPID software. CONTRAST:  100 cc of Isovue 370. COMPARISON:  Prior head CT from earlier the same day. 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 No evidence for hemorrhage or mass effect. Overall, this is stable to minimally progressed from scan performed earlier the same day and. No other acute large vessel territory infarct. No acute intracranial hemorrhage. No mass lesion, midline shift or mass effect. No hydrocephalus. No extra-axial fluid collection. Vascular: Focal hyperdensity within distal right M1/proximal right M2 branches, consistent with thrombus. Vascular calcifications noted within the carotid siphons. Skull: Scalp soft tissues and calvarium within normal limits. Sinuses/Orbits: Globes and orbital soft tissues within normal limits. Mild mucosal thickening within the maxillary sinuses. Paranasal sinuses are otherwise clear. No mastoid effusion. ASPECTS [facility] Stroke Program Early CT Score) - Ganglionic level infarction (caudate, lentiform nuclei, internal capsule, insula, M1-M3 cortex): 5 - Supraganglionic infarction (M4-M6 cortex): 2 Total score (0-10 with 10 being normal): 7 Review of the MIP images confirms the above findings CTA NECK FINDINGS Aortic arch: Visualized aortic arch of normal caliber with normal branch pattern. Minimal plaque within the arch  itself. No flow-limiting stenosis about the origin of the great vessels. Visualized subclavian arteries widely patent. Right carotid system: Right common carotid artery widely patent from its origin to the bifurcation. A centric calcified plaque about the right bifurcation/proximal right ICA. There is abrupt near occlusion of the proximal right ICA at the bifurcation (series 12, image 202). A focal string sign is present. Intraluminal filling defect just distally consistent with thrombus. Area of involvement begins at the bifurcation and measures approximately 15 mm in length. A focal ruptured plaque with associated acute dissection is suspected. Distally, the right ICA is somewhat diminutive in caliber and irregular, but is patent to the skullbase without additional focal stenosis or other vascular abnormality. Left carotid system: Left common carotid artery widely patent from its origin to the bifurcation. Minimal plaque about the left bifurcation without stenosis. Left ICA widely patent distally to the skullbase without stenosis, dissection, or occlusion. Vertebral arteries: Left vertebral artery arises separately from the aortic arch. Right vertebral artery rises from the right subclavian artery. Right vertebral artery slightly dominant. Vertebral arteries patent within the neck without stenosis, dissection, or occlusion. Skeleton: No acute osseus abnormality. No worrisome lytic or blastic osseous lesions. Mild to moderate multilevel degenerative spondylolysis within the cervical spine, greatest at C5-6. Other neck: No acute soft tissue abnormality within the neck. Salivary glands normal. No adenopathy. Thyroid normal. Upper chest: Visualized upper chest within normal limits. Visualized lungs are clear. Review of the MIP images confirms the above findings CTA HEAD FINDINGS Anterior circulation: Petrous, cavernous, and supraclinoid segments patent bilaterally without flow-limiting stenosis. Minimal plaque noted  within the cavernous ICAs. ICA termini widely patent. A1 segments patent bilaterally. Anterior communicating artery normal. Anterior cerebral artery is patent to their distal aspects. Right M1 segment patent to its distal aspect. There is occlusion of proximal right M2 branch at the base of the right sylvian fissure (series 12, image 96), likely embolic from thrombus seen in the neck. Right MCA branches demonstrate multifocal irregularity with stenoses distally. Left M1 segment patent to its distal aspect. No proximal left M2 occlusion. Left MCA branches well opacified to their distal aspects.  Posterior circulation: Vertebral artery's patent to the vertebrobasilar junction without stenosis. Patent left PICA. Dominant right AICA. Basilar artery widely patent to its distal aspect. Superior cerebral arteries patent bilaterally. PCA supplied via the basilar as well is prominent bilateral posterior communicating arteries. PCAs patent to their distal aspects. Venous sinuses: Patent. Anatomic variants: No significant anatomic variant. No aneurysm or vascular malformation. Delayed phase: Not performed. Review of the MIP images confirms the above findings CT Brain Perfusion Findings: CBF (<30%) Volume: 0mL Perfusion (Tmax>6.0s) volume: 86mL Mismatch Volume: 86mL Infarction Location:While no core infarct is registered, there is evidence of evolving ischemia within the right insula and frontal operculum on prior noncontrast head CT. This is suspected to be mass by collateralization. Evidence of moderate surrounding penumbra within the right frontal lobe on associated perfusion map. IMPRESSION: 1. 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 Right ICA somewhat diffusely narrow and irregular distally to the skullbase. 2. Positive CTA for emergent large vessel occlusion with right M2  occlusion and evolving right frontal lobe infarct involving the right insula and frontal operculum. Moderate surrounding penumbra as above. 3. Additional mild for age atherosclerotic changes above. No other high-grade or correctable stenosis. Critical Value/emergent results were called by telephone at the time of interpretation on 11/25/2016 at 4:16 pm to Dr. Milon Dikes , who verbally acknowledged these results. Electronically Signed   By: Rise Mu M.D.   On: 11/25/2016 16:53   Portable Chest Xray  Result Date: 11/25/2016 CLINICAL DATA:  Intubation EXAM: PORTABLE CHEST 1 VIEW COMPARISON:  None. FINDINGS: Endotracheal tube tip is about 4.3 cm superior to the carina. Esophageal tube tip is in the left upper quadrant. Low lung volumes. Atelectasis or mild infiltrate at the left lung base. Normal heart size. Mediastinal with upper normal, likely augmented by low lung volume. No pneumothorax. IMPRESSION: 1. Endotracheal tube tip about 4.3 cm superior to carina 2. Patchy atelectasis or infiltrate at the left lung base 3. Upper normal mediastinal with, likely augmented by low lung volume. Electronically Signed   By: Jasmine Pang M.D.   On: 11/25/2016 20:40    STUDIES:  CT head 7/23 > Early acute right middle cerebral artery distribution stroke. Hyperdense right middle cerebral artery indicates acute or subacute occlusion. CT angio/perfusion head 7/23 > Short-segment near occlusion of the proximal right ICA with associated intraluminal thrombus as above. Positive CTA for emergent large vessel occlusion with right M2 occlusion and evolving right frontal lobe infarct involving the right insula and frontal operculum. Moderate surrounding penumbra as above. Ct head post op 7/23 > Serpiginous subarachnoid hyperdensity within the right sylvian fissure and overlying right frontal lobe, most consistent with contrast leakage from recent catheter directed arteriogram. Superimposed hemorrhage not entirely  excluded. Evolving acute right MCA territory infarct.  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 Underwent IR procedure complicated by hemorrhage. Transferred to ICU on vent for further monitoring.  ASSESSMENT / PLAN:  PULMONARY A: Inability to protect airway in post-operative setting   P:  PS trials, no extubation until sheath is out and is able to sit up ABG noted VAP bundle  CARDIOVASCULAR A:  Hypertension - SBP 200 immediately post op  P:  Telemetry monitoring Goal SBP of 120-140 Cleviprex for  BP control PRN hydralazine  RENAL A:   No acute issues  P:   Repeat BMP in AM Replace electrolytes as indicated NS 75 ml/hr  GASTROINTESTINAL A:   No acute issues  P:   NPO GI ppx: IV protonix If not extubated by AM then will start TF in AM  HEMATOLOGIC A:   Leukopenia   P:  Follow CBC Transfuse per ICU protocol  INFECTIOUS A:   No acute issues  P:   Follow CBC and fever curve Hold off abx for now  ENDOCRINE A:   No acute issues  P:   CBG monitoring q 4 SSI  NEUROLOGIC A:   R MCA CVA s/p clot retrieval revascularization  R carotic stenosis/occlusion s/p stented angioplasty SAH post op P:   RASS goal: -1 to -2 Propofol infusion for sedation PRN fentanyl for analgesia [place] service primary CT today Sheath out at noon then lay flat for 4 hours  FAMILY  - Updates: No family bedside to update  - Inter-disciplinary family meet or Palliative Care meeting due by:  7/30  The patient is critically ill with multiple organ systems failure and requires high complexity decision making for assessment and support, frequent evaluation and titration of therapies, application of advanced monitoring technologies and extensive interpretation of multiple databases.   Critical Care Time devoted to patient  care services described in this note is  35  Minutes. This time reflects time of care of this signee Dr Koren Bound. This critical care time does not reflect procedure time, or teaching time or supervisory time of PA/NP/Med student/Med Resident etc but could involve care discussion time.  Alyson Reedy, M.D. [facility] Pulmonary/Critical Care Medicine. Pager: [phone]. After hours pager: [phone].  11/26/2016 10:06 AM

## 2016-11-26 NOTE — Progress Notes (Signed)
*  PRELIMINARY RESULTS* Vascular Ultrasound Carotid Duplex (Doppler) has been completed.  Preliminary findings: Right proximal internal carotid artery/stent demonstrates a 40-59% stenosis.  Unable to clearly visualize distal end of stent.  The left internal carotid artery demonstrates a 1-39% stenosis with elevated velocities.  Bilateral vertebral arteries appear patent with antegrade flow.  Elevated velocities noted in the vertebral arteries bilaterally, left greater than right.  Jason FischerCharlotte C Joniah Nolan 11/26/2016, 5:30 PM

## 2016-11-26 NOTE — ED Provider Notes (Signed)
[facility] DEPT Provider Note   CSN: [id number] Arrival date & time: 11/25/16  1514     History   Chief Complaint Chief Complaint  Patient presents with  . Code Stroke    HPI Jason Nolan is a 71 y.o. male history of hepatitis C who is transferred here from [name] for further evaluation of stroke symptoms. Patient apparently awoke around 2 AM and then went to sleep and woke up again at 9 AM with some slurred speech and noted some left-sided deficits. No nausea, vomiting, chest pain, shortness of breath, abdominal pain.  HPI  Past Medical History:  Diagnosis Date  . Glaucoma   . Hepatitis C     Patient Active Problem List   Diagnosis Date Noted  . Cerebral embolism with cerebral infarction 11/25/2016  . Acute ischemic stroke ([place]) 11/25/2016  . Leukopenia 03/31/2016  . Hepatitis C 07/22/2013    History reviewed. No pertinent surgical history.     Home Medications    Prior to Admission medications   Medication Sig Start Date End Date Taking? Authorizing Provider  bimatoprost (LUMIGAN) 0.01 % SOLN Place 1 drop into both eyes at bedtime.    [provider]  PAZEO 0.7 % SOLN Place 1 drop into both eyes daily.  10/11/16   [provider]  SIMBRINZA 1-0.2 % SUSP Place 1 drop into the right eye 2 (two) times daily. 09/04/16   [provider]  VYZULTA 0.024 % SOLN Apply 1 drop to eye 2 (two) times daily.  11/12/16   [provider]    Family History Family History  Problem Relation Age of Onset  . Hypertension Mother   . Hypertension Father     Social History Social History  Substance Use Topics  . Smoking status: Former Research scientist (life sciences)  . Smokeless tobacco: Former Systems developer    Quit date: 05/06/1993  . Alcohol use No     Allergies   Patient has no known allergies.   Review of Systems Review of Systems  Constitutional: Negative for fever.  HENT: Negative for sore throat.   Eyes: Negative for  photophobia.  Respiratory: Negative for shortness of breath.   Cardiovascular: Negative for chest pain.  Gastrointestinal: Negative for abdominal pain, nausea and vomiting.  Genitourinary: Negative for hematuria.  Musculoskeletal: Negative for back pain.  Skin: Negative for wound.  Neurological: Positive for facial asymmetry, speech difficulty and weakness. Negative for seizures.  Psychiatric/Behavioral: Negative for agitation.     Physical Exam Updated Vital Signs BP 104/66   Pulse 73   Temp 97.8 F (36.6 C) (Oral)   Resp 16   Ht '5\' 10"'$  (1.778 m)   Wt 96.9 kg (213 lb 10 oz)   SpO2 100%   BMI 30.65 kg/m   Physical Exam  Constitutional: He is oriented to person, place, and time. He appears well-developed and well-nourished.  HENT:  Head: Normocephalic.  Eyes: Conjunctivae and EOM are normal.  Neck: No tracheal deviation present.  Cardiovascular: Normal rate and intact distal pulses.   Pulmonary/Chest: Effort normal.  Abdominal: Soft. He exhibits no distension. There is no tenderness.  Musculoskeletal: He exhibits no tenderness or deformity.  Neurological: He is alert and oriented to person, place, and time. No cranial nerve deficit or sensory deficit. He exhibits normal muscle tone.  Patient with mild left-sided upper and lower extremity weakness on my exam, very dysarthric speech  Skin: Skin is warm and dry.  Psychiatric: He has a normal mood and affect.  ED Treatments / Results  Labs (all labs ordered are listed, but only abnormal results are displayed) Labs Reviewed  BLOOD GAS, ARTERIAL - Abnormal; Notable for the following:       Result Value   pO2, Arterial 195 (*)    Acid-base deficit 2.6 (*)    All other components within normal limits  MRSA PCR SCREENING  TRIGLYCERIDES  HEMOGLOBIN A1C  LIPID PANEL  CREATININE, SERUM  CBC WITH DIFFERENTIAL/PLATELET  BASIC METABOLIC PANEL    EKG  EKG Interpretation None       Radiology Ct Angio Head W Or Wo  Contrast  Result Date: 11/25/2016 CLINICAL DATA:  Initial evaluation for acute dysarthria, stroke. EXAM: CT ANGIOGRAPHY HEAD AND NECK CT PERFUSION BRAIN TECHNIQUE: Multidetector CT imaging of the head and neck was performed using the standard protocol during bolus administration of intravenous contrast. Multiplanar CT image reconstructions and MIPs were obtained to evaluate the vascular anatomy. Carotid stenosis measurements (when applicable) are obtained utilizing NASCET criteria, using the distal internal carotid diameter as the denominator. Multiphase CT imaging of the brain was performed following IV bolus contrast injection. Subsequent parametric perfusion maps were calculated using RAPID software. CONTRAST:  100 cc of Isovue 370. COMPARISON:  Prior head CT from earlier the same day. 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 No evidence for hemorrhage or mass effect. Overall, this is stable to minimally progressed from scan performed earlier the same day and. No other acute large vessel territory infarct. No acute intracranial hemorrhage. No mass lesion, midline shift or mass effect. No hydrocephalus. No extra-axial fluid collection. Vascular: Focal hyperdensity within distal right M1/proximal right M2 branches, consistent with thrombus. Vascular calcifications noted within the carotid siphons. Skull: Scalp soft tissues and calvarium within normal limits. Sinuses/Orbits: Globes and orbital soft tissues within normal limits. Mild mucosal thickening within the maxillary sinuses. Paranasal sinuses are otherwise clear. No mastoid effusion. ASPECTS [facility] Stroke Program Early CT Score) - Ganglionic level infarction (caudate, lentiform nuclei, internal capsule, insula, M1-M3 cortex): 5 - Supraganglionic infarction (M4-M6 cortex): 2 Total score (0-10 with 10 being normal): 7 Review  of the MIP images confirms the above findings CTA NECK FINDINGS Aortic arch: Visualized aortic arch of normal caliber with normal branch pattern. Minimal plaque within the arch itself. No flow-limiting stenosis about the origin of the great vessels. Visualized subclavian arteries widely patent. Right carotid system: Right common carotid artery widely patent from its origin to the bifurcation. A centric calcified plaque about the right bifurcation/proximal right ICA. There is abrupt near occlusion of the proximal right ICA at the bifurcation (series 12, image 202). A focal string sign is present. Intraluminal filling defect just distally consistent with thrombus. Area of involvement begins at the bifurcation and measures approximately 15 mm in length. A focal ruptured plaque with associated acute dissection is suspected. Distally, the right ICA is somewhat diminutive in caliber and irregular, but is patent to the skullbase without additional focal stenosis or other vascular abnormality. Left carotid system: Left common carotid artery widely patent from its origin to the bifurcation. Minimal plaque about the left bifurcation without stenosis. Left ICA widely patent distally to the skullbase without stenosis, dissection, or occlusion. Vertebral arteries: Left vertebral artery arises separately from the aortic arch. Right vertebral artery rises from the right subclavian artery. Right vertebral artery slightly dominant. Vertebral arteries patent within the neck without stenosis, dissection,  or occlusion. Skeleton: No acute osseus abnormality. No worrisome lytic or blastic osseous lesions. Mild to moderate multilevel degenerative spondylolysis within the cervical spine, greatest at C5-6. Other neck: No acute soft tissue abnormality within the neck. Salivary glands normal. No adenopathy. Thyroid normal. Upper chest: Visualized upper chest within normal limits. Visualized lungs are clear. Review of the MIP images confirms  the above findings CTA HEAD FINDINGS Anterior circulation: Petrous, cavernous, and supraclinoid segments patent bilaterally without flow-limiting stenosis. Minimal plaque noted within the cavernous ICAs. ICA termini widely patent. A1 segments patent bilaterally. Anterior communicating artery normal. Anterior cerebral artery is patent to their distal aspects. Right M1 segment patent to its distal aspect. There is occlusion of proximal right M2 branch at the base of the right sylvian fissure (series 12, image 96), likely embolic from thrombus seen in the neck. Right MCA branches demonstrate multifocal irregularity with stenoses distally. Left M1 segment patent to its distal aspect. No proximal left M2 occlusion. Left MCA branches well opacified to their distal aspects. Posterior circulation: Vertebral artery's patent to the vertebrobasilar junction without stenosis. Patent left PICA. Dominant right AICA. Basilar artery widely patent to its distal aspect. Superior cerebral arteries patent bilaterally. PCA supplied via the basilar as well is prominent bilateral posterior communicating arteries. PCAs patent to their distal aspects. Venous sinuses: Patent. Anatomic variants: No significant anatomic variant. No aneurysm or vascular malformation. Delayed phase: Not performed. Review of the MIP images confirms the above findings CT Brain Perfusion Findings: CBF (<30%) Volume: 52m Perfusion (Tmax>6.0s) volume: 832mMismatch Volume: 8632mnfarction Location:While no core infarct is registered, there is evidence of evolving ischemia within the right insula and frontal operculum on prior noncontrast head CT. This is suspected to be mass by collateralization. Evidence of moderate surrounding penumbra within the right frontal lobe on associated perfusion map. IMPRESSION: 1. 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 Findings likely reflect sequelae of a ruptured plaque  with associated dissection. Right ICA somewhat diffusely narrow and irregular distally to the skullbase. 2. Positive CTA for emergent large vessel occlusion with right M2 occlusion and evolving right frontal lobe infarct involving the right insula and frontal operculum. Moderate surrounding penumbra as above. 3. Additional mild for age atherosclerotic changes above. No other high-grade or correctable stenosis. Critical Value/emergent results were called by telephone at the time of interpretation on 11/25/2016 at 4:16 pm to Dr. ASHAmie Portlandwho verbally acknowledged these results. Electronically Signed   By: BenJeannine BogaD.   On: 11/25/2016 16:53   Ct Head Wo Contrast  Result Date: 11/25/2016 CLINICAL DATA:  Follow-up exam status post catheter directed thrombectomy. EXAM: CT HEAD WITHOUT CONTRAST TECHNIQUE: Contiguous axial images were obtained from the base of the skull through the vertex without intravenous contrast. COMPARISON:  Prior CTs from earlier the same day. FINDINGS: Brain: Serpiginous hyperdensity seen layering throughout the right sylvian fissure and multiple cortical sulci of the overlying right frontal lobe, likely reflecting contrast leakage from recent arteriogram. Superimposed hemorrhage not excluded. Evolving hypodensity within the right insula an overlying operculum and right frontal lobe consistent with right MCA territory stroke. Overall, this has slightly evolved from previous. No other acute intracranial hemorrhage. No other acute large vessel territory infarct. No mass lesion or midline shift. No hydrocephalus. No extra-axial fluid collection. Vascular: Diffuse hyperdensity throughout the intracranial vasculature, like related IV contrast. Skull: Scalp soft tissues and calvarium within normal limits. Sinuses/Orbits: Globes and orbital soft tissues within normal  limits. Scattered mucosal thickening within the ethmoidal air cells, sphenoid sinuses, and maxillary sinuses. Mastoid  air cells remain clear. IMPRESSION: 1. Serpiginous subarachnoid hyperdensity within the right sylvian fissure and overlying right frontal lobe, most consistent with contrast leakage from recent catheter directed arteriogram. Superimposed hemorrhage not entirely excluded. Attention at follow-up recommended. 2. Evolving acute right MCA territory infarct. Electronically Signed   By: Jeannine Boga M.D.   On: 11/25/2016 20:27   Ct Head Wo Contrast  Result Date: 11/25/2016 CLINICAL DATA:  71 year old who woke up this morning with facial droop, left upper extremity weakness and slurred speech. EXAM: CT HEAD WITHOUT CONTRAST TECHNIQUE: Contiguous axial images were obtained from the base of the skull through the vertex without intravenous contrast. COMPARISON:  None. FINDINGS: Brain: Ventricular system normal in size and appearance for age. Mild changes of small vessel disease of the white matter. Asymmetric low attenuation involving the white matter of the right superior temporal lobe extending into the right frontal lobe. No associated hemorrhage or hematoma. No extra-axial fluid collections. No visible mass lesion. No midline shift. Mild cerebellar vermian atrophy. No cortical atrophy. Vascular: Hyperdense right middle cerebral artery. Mild bilateral carotid siphon atherosclerosis. Skull: No skull fracture or other focal osseous abnormality involving the skull. Sinuses/Orbits: Visualized paranasal sinuses, bilateral mastoid air cells and bilateral middle ear cavities well-aerated. Visualized orbits and globes are normal. Other: None. IMPRESSION: 1. 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 Hyperdense right middle cerebral artery indicates acute or subacute occlusion. 2. No associated hemorrhage, hematoma, mass effect or midline shift at this time. I personally telephoned these results at the time of interpretation on 11/25/2016 at 1:51 pm to Dr. Eula Listen, who verbally acknowledged  these results. Electronically Signed   By: Evangeline Dakin M.D.   On: 11/25/2016 13:53   Ct Angio Neck W Or Wo Contrast  Result Date: 11/25/2016 CLINICAL DATA:  Initial evaluation for acute dysarthria, stroke. EXAM: CT ANGIOGRAPHY HEAD AND NECK CT PERFUSION BRAIN TECHNIQUE: Multidetector CT imaging of the head and neck was performed using the standard protocol during bolus administration of intravenous contrast. Multiplanar CT image reconstructions and MIPs were obtained to evaluate the vascular anatomy. Carotid stenosis measurements (when applicable) are obtained utilizing NASCET criteria, using the distal internal carotid diameter as the denominator. Multiphase CT imaging of the brain was performed following IV bolus contrast injection. Subsequent parametric perfusion maps were calculated using RAPID software. CONTRAST:  100 cc of Isovue 370. COMPARISON:  Prior head CT from earlier the same day. 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 No evidence for hemorrhage or mass effect. Overall, this is stable to minimally progressed from scan performed earlier the same day and. No other acute large vessel territory infarct. No acute intracranial hemorrhage. No mass lesion, midline shift or mass effect. No hydrocephalus. No extra-axial fluid collection. Vascular: Focal hyperdensity within distal right M1/proximal right M2 branches, consistent with thrombus. Vascular calcifications noted within the carotid siphons. Skull: Scalp soft tissues and calvarium within normal limits. Sinuses/Orbits: Globes and orbital soft tissues within normal limits. Mild mucosal thickening within the maxillary sinuses. Paranasal sinuses are otherwise clear. No mastoid effusion. ASPECTS [facility] Stroke Program Early CT Score) - Ganglionic level infarction (caudate, lentiform nuclei, internal capsule, insula,  M1-M3 cortex): 5 - Supraganglionic infarction (M4-M6 cortex): 2 Total score (0-10 with 10 being normal): 7 Review of the MIP images confirms  the above findings CTA NECK FINDINGS Aortic arch: Visualized aortic arch of normal caliber with normal branch pattern. Minimal plaque within the arch itself. No flow-limiting stenosis about the origin of the great vessels. Visualized subclavian arteries widely patent. Right carotid system: Right common carotid artery widely patent from its origin to the bifurcation. A centric calcified plaque about the right bifurcation/proximal right ICA. There is abrupt near occlusion of the proximal right ICA at the bifurcation (series 12, image 202). A focal string sign is present. Intraluminal filling defect just distally consistent with thrombus. Area of involvement begins at the bifurcation and measures approximately 15 mm in length. A focal ruptured plaque with associated acute dissection is suspected. Distally, the right ICA is somewhat diminutive in caliber and irregular, but is patent to the skullbase without additional focal stenosis or other vascular abnormality. Left carotid system: Left common carotid artery widely patent from its origin to the bifurcation. Minimal plaque about the left bifurcation without stenosis. Left ICA widely patent distally to the skullbase without stenosis, dissection, or occlusion. Vertebral arteries: Left vertebral artery arises separately from the aortic arch. Right vertebral artery rises from the right subclavian artery. Right vertebral artery slightly dominant. Vertebral arteries patent within the neck without stenosis, dissection, or occlusion. Skeleton: No acute osseus abnormality. No worrisome lytic or blastic osseous lesions. Mild to moderate multilevel degenerative spondylolysis within the cervical spine, greatest at C5-6. Other neck: No acute soft tissue abnormality within the neck. Salivary glands normal. No adenopathy. Thyroid normal. Upper  chest: Visualized upper chest within normal limits. Visualized lungs are clear. Review of the MIP images confirms the above findings CTA HEAD FINDINGS Anterior circulation: Petrous, cavernous, and supraclinoid segments patent bilaterally without flow-limiting stenosis. Minimal plaque noted within the cavernous ICAs. ICA termini widely patent. A1 segments patent bilaterally. Anterior communicating artery normal. Anterior cerebral artery is patent to their distal aspects. Right M1 segment patent to its distal aspect. There is occlusion of proximal right M2 branch at the base of the right sylvian fissure (series 12, image 96), likely embolic from thrombus seen in the neck. Right MCA branches demonstrate multifocal irregularity with stenoses distally. Left M1 segment patent to its distal aspect. No proximal left M2 occlusion. Left MCA branches well opacified to their distal aspects. Posterior circulation: Vertebral artery's patent to the vertebrobasilar junction without stenosis. Patent left PICA. Dominant right AICA. Basilar artery widely patent to its distal aspect. Superior cerebral arteries patent bilaterally. PCA supplied via the basilar as well is prominent bilateral posterior communicating arteries. PCAs patent to their distal aspects. Venous sinuses: Patent. Anatomic variants: No significant anatomic variant. No aneurysm or vascular malformation. Delayed phase: Not performed. Review of the MIP images confirms the above findings CT Brain Perfusion Findings: CBF (<30%) Volume: 61mL Perfusion (Tmax>6.0s) volume: 62mL Mismatch Volume: 23mL Infarction Location:While no core infarct is registered, there is evidence of evolving ischemia within the right insula and frontal operculum on prior noncontrast head CT. This is suspected to be mass by collateralization. Evidence of moderate surrounding penumbra within the right frontal lobe on associated perfusion map. IMPRESSION: 1. Short-segment near occlusion of the proximal  right ICA with associated intraluminal thrombus as above. 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 Right ICA somewhat diffusely narrow and irregular distally to the skullbase. 2. Positive CTA for emergent large vessel occlusion with right M2 occlusion and evolving right frontal lobe infarct involving the right insula and frontal operculum. Moderate surrounding penumbra as above. 3. Additional mild  for age atherosclerotic changes above. No other high-grade or correctable stenosis. Critical Value/emergent results were called by telephone at the time of interpretation on 11/25/2016 at 4:16 pm to Dr. Amie Portland , who verbally acknowledged these results. Electronically Signed   By: Jeannine Boga M.D.   On: 11/25/2016 16:53   Ct Cerebral Perfusion W Contrast  Result Date: 11/25/2016 CLINICAL DATA:  Initial evaluation for acute dysarthria, stroke. EXAM: CT ANGIOGRAPHY HEAD AND NECK CT PERFUSION BRAIN TECHNIQUE: Multidetector CT imaging of the head and neck was performed using the standard protocol during bolus administration of intravenous contrast. Multiplanar CT image reconstructions and MIPs were obtained to evaluate the vascular anatomy. Carotid stenosis measurements (when applicable) are obtained utilizing NASCET criteria, using the distal internal carotid diameter as the denominator. Multiphase CT imaging of the brain was performed following IV bolus contrast injection. Subsequent parametric perfusion maps were calculated using RAPID software. CONTRAST:  100 cc of Isovue 370. COMPARISON:  Prior head CT from earlier the same day. 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 No evidence for hemorrhage or mass effect. Overall, this is stable to minimally progressed from scan performed earlier the same day and. No other acute large vessel  territory infarct. No acute intracranial hemorrhage. No mass lesion, midline shift or mass effect. No hydrocephalus. No extra-axial fluid collection. Vascular: Focal hyperdensity within distal right M1/proximal right M2 branches, consistent with thrombus. Vascular calcifications noted within the carotid siphons. Skull: Scalp soft tissues and calvarium within normal limits. Sinuses/Orbits: Globes and orbital soft tissues within normal limits. Mild mucosal thickening within the maxillary sinuses. Paranasal sinuses are otherwise clear. No mastoid effusion. ASPECTS [facility] Stroke Program Early CT Score) - Ganglionic level infarction (caudate, lentiform nuclei, internal capsule, insula, M1-M3 cortex): 5 - Supraganglionic infarction (M4-M6 cortex): 2 Total score (0-10 with 10 being normal): 7 Review of the MIP images confirms the above findings CTA NECK FINDINGS Aortic arch: Visualized aortic arch of normal caliber with normal branch pattern. Minimal plaque within the arch itself. No flow-limiting stenosis about the origin of the great vessels. Visualized subclavian arteries widely patent. Right carotid system: Right common carotid artery widely patent from its origin to the bifurcation. A centric calcified plaque about the right bifurcation/proximal right ICA. There is abrupt near occlusion of the proximal right ICA at the bifurcation (series 12, image 202). A focal string sign is present. Intraluminal filling defect just distally consistent with thrombus. Area of involvement begins at the bifurcation and measures approximately 15 mm in length. A focal ruptured plaque with associated acute dissection is suspected. Distally, the right ICA is somewhat diminutive in caliber and irregular, but is patent to the skullbase without additional focal stenosis or other vascular abnormality. Left carotid system: Left common carotid artery widely patent from its origin to the bifurcation. Minimal plaque about the left bifurcation  without stenosis. Left ICA widely patent distally to the skullbase without stenosis, dissection, or occlusion. Vertebral arteries: Left vertebral artery arises separately from the aortic arch. Right vertebral artery rises from the right subclavian artery. Right vertebral artery slightly dominant. Vertebral arteries patent within the neck without stenosis, dissection, or occlusion. Skeleton: No acute osseus abnormality. No worrisome lytic or blastic osseous lesions. Mild to moderate multilevel degenerative spondylolysis within the cervical spine, greatest at C5-6. Other neck: No acute soft tissue abnormality within the neck. Salivary glands normal. No adenopathy. Thyroid normal. Upper  chest: Visualized upper chest within normal limits. Visualized lungs are clear. Review of the MIP images confirms the above findings CTA HEAD FINDINGS Anterior circulation: Petrous, cavernous, and supraclinoid segments patent bilaterally without flow-limiting stenosis. Minimal plaque noted within the cavernous ICAs. ICA termini widely patent. A1 segments patent bilaterally. Anterior communicating artery normal. Anterior cerebral artery is patent to their distal aspects. Right M1 segment patent to its distal aspect. There is occlusion of proximal right M2 branch at the base of the right sylvian fissure (series 12, image 96), likely embolic from thrombus seen in the neck. Right MCA branches demonstrate multifocal irregularity with stenoses distally. Left M1 segment patent to its distal aspect. No proximal left M2 occlusion. Left MCA branches well opacified to their distal aspects. Posterior circulation: Vertebral artery's patent to the vertebrobasilar junction without stenosis. Patent left PICA. Dominant right AICA. Basilar artery widely patent to its distal aspect. Superior cerebral arteries patent bilaterally. PCA supplied via the basilar as well is prominent bilateral posterior communicating arteries. PCAs patent to their distal  aspects. Venous sinuses: Patent. Anatomic variants: No significant anatomic variant. No aneurysm or vascular malformation. Delayed phase: Not performed. Review of the MIP images confirms the above findings CT Brain Perfusion Findings: CBF (<30%) Volume: 84m Perfusion (Tmax>6.0s) volume: 872mMismatch Volume: 8653mnfarction Location:While no core infarct is registered, there is evidence of evolving ischemia within the right insula and frontal operculum on prior noncontrast head CT. This is suspected to be mass by collateralization. Evidence of moderate surrounding penumbra within the right frontal lobe on associated perfusion map. IMPRESSION: 1. 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 Right ICA somewhat diffusely narrow and irregular distally to the skullbase. 2. Positive CTA for emergent large vessel occlusion with right M2 occlusion and evolving right frontal lobe infarct involving the right insula and frontal operculum. Moderate surrounding penumbra as above. 3. Additional mild for age atherosclerotic changes above. No other high-grade or correctable stenosis. Critical Value/emergent results were called by telephone at the time of interpretation on 11/25/2016 at 4:16 pm to Dr. ASHAmie Portlandwho verbally acknowledged these results. Electronically Signed   By: BenJeannine BogaD.   On: 11/25/2016 16:53   Portable Chest Xray  Result Date: 11/25/2016 CLINICAL DATA:  Intubation EXAM: PORTABLE CHEST 1 VIEW COMPARISON:  None. FINDINGS: Endotracheal tube tip is about 4.3 cm superior to the carina. Esophageal tube tip is in the left upper quadrant. Low lung volumes. Atelectasis or mild infiltrate at the left lung base. Normal heart size. Mediastinal with upper normal, likely augmented by low lung volume. No pneumothorax. IMPRESSION: 1. Endotracheal tube tip about 4.3 cm  superior to carina 2. Patchy atelectasis or infiltrate at the left lung base 3. Upper normal mediastinal with, likely augmented by low lung volume. Electronically Signed   By: KimDonavan FoilD.   On: 11/25/2016 20:40    Procedures Procedures (including critical care time)  Emergency Ultrasound Study:   Angiocath insertion Performed by: AndValda Lambonsent: Verbal consent obtained. Risks and benefits: risks, benefits and alternatives were discussed Immediately prior to procedure the correct patient, procedure, equipment, support staff and site/side marked as needed.  Indication: difficult IV access Preparation: Patient was prepped and draped in the usual sterile fashion. Vein Location: Right a/c vein was visualized during assessment for potential access sites and was found to be patent/ easily compressed with linear ultrasound.  The needle was visualized with real-time ultrasound and guided into the vein. Gauge: 18  Image saved and stored.  Normal blood return.  Patient tolerance: Patient tolerated the procedure well with no immediate complications.      Medications Ordered in ED Medications  iopamidol (ISOVUE-300) 61 % injection (not administered)  lidocaine (PF) (XYLOCAINE) 1 % injection (not administered)  nitroGLYCERIN 100 MCG/ML intra-arterial injection (not administered)  aspirin 325 MG tablet (not administered)  clopidogrel (PLAVIX) 300 MG tablet (not administered)  iopamidol (ISOVUE-300) 61 % injection (not administered)  0.9 %  sodium chloride infusion (not administered)  senna-docusate (Senokot-S) tablet 1 tablet (not administered)  heparin injection 5,000 Units (5,000 Units Subcutaneous Given 11/25/16 2159)  eptifibatide (INTEGRILIN) 20 MG/10ML injection (not administered)  nitroGLYCERIN 25 mcg in sodium chloride 0.9 % 59.71 mL (25 mcg/mL) syringe (25 mcg Intra-arterial Given 11/25/16 1814)  eptifibatide (INTEGRILIN) injection (1.8 mg Intravenous Given 11/25/16  1842)  acetaminophen (TYLENOL) tablet 650 mg (not administered)    Or  acetaminophen (TYLENOL) solution 650 mg (not administered)    Or  acetaminophen (TYLENOL) suppository 650 mg (not administered)  ondansetron (ZOFRAN) injection 4 mg (not administered)  0.9 %  sodium chloride infusion ( Intravenous New Bag/Given 11/25/16 2133)  clevidipine (CLEVIPREX) infusion 0.5 mg/mL (not administered)  aspirin tablet 325 mg (not administered)  clopidogrel (PLAVIX) tablet 75 mg (not administered)  pantoprazole (PROTONIX) injection 40 mg (40 mg Intravenous Given 11/25/16 2223)  chlorhexidine gluconate (MEDLINE KIT) (PERIDEX) 0.12 % solution 15 mL (15 mLs Mouth Rinse Given 11/25/16 2000)  fentaNYL (SUBLIMAZE) injection 50 mcg (50 mcg Intravenous Given 11/25/16 2245)  fentaNYL (SUBLIMAZE) injection 50 mcg (not administered)  propofol (DIPRIVAN) 1000 MG/100ML infusion (50 mcg/kg/min  90.7 kg Intravenous New Bag/Given 11/25/16 2122)  nicardipine (CARDENE) '20mg'$  in 0.86% saline 26m IV infusion (0.1 mg/ml) (not administered)  hydrALAZINE (APRESOLINE) injection 10-20 mg (not administered)  latanoprost (XALATAN) 0.005 % ophthalmic solution 1 drop (1 drop Both Eyes Given 11/25/16 2216)  olopatadine (PATANOL) 0.1 % ophthalmic solution 1 drop (1 drop Both Eyes Given 11/25/16 2218)  MEDLINE mouth rinse (15 mLs Mouth Rinse Given 11/25/16 2200)  iopamidol (ISOVUE-370) 76 % injection 100 mL (100 mLs Intravenous Contrast Given 11/25/16 1544)  fentaNYL (SUBLIMAZE) 100 MCG/2ML injection (  Override pull for Anesthesia 11/25/16 1634)  midazolam (VERSED) 2 MG/2ML injection (  Override pull for Anesthesia 11/25/16 1630)   stroke: mapping our early stages of recovery book ( Does not apply Given 11/25/16 2132)  iopamidol (ISOVUE-300) 61 % injection 300 mL (150 mLs Intravenous Contrast Given 11/25/16 1944)     Initial Impression / Assessment and Plan / ED Course  I have reviewed the triage vital signs and the nursing  notes.  Pertinent labs & imaging results that were available during my care of the patient were reviewed by me and considered in my medical decision making (see chart for details).     [facility]. Woke up at 9 AM with left-sided weakness, left facial droop, and slurred speech. NIH stroke scale was 6 at that hospital. Outside of TPA window. Sent here for evaluation for thrombectomy. Imaging consistent with right MCA stroke.  Neurology saw patient here in the emergency department. NIH scale now 4. Patient sent IR with plan for possible right carotid stenting followed by right MCA thrombectomy He will be admitted to the neurology service for further evaluation. Patient transferred from the emergency department in stable condition and was  stable while under my care.  Patient was seen with my attending, Dr. Winfred Leeds, who voiced agreement and oversaw the evaluation and treatment of this patient.   Dragon [employer] seismologist was used in the creation of this note. If there are any errors or inconsistencies needing clarification, please contact me directly.   Final Clinical Impressions(s) / ED Diagnoses   Final diagnoses:  Stroke (cerebrum) [facility])    New Prescriptions Current Discharge Medication List       Valda Lamb, MD 11/26/16 [place], [place], MD 11/27/16 [phone]

## 2016-11-26 NOTE — Progress Notes (Signed)
79F sheath pull right groin at 12:40, hemostasis achieved, stopped holding pressure at 13:25, vpad applied with pressure dressing, dressing reviewed with Sullivan LoneJamie White RN, no complications.  Harley-DavidsonCory Sandling Kumari Sculley

## 2016-11-26 NOTE — Progress Notes (Signed)
Initial Nutrition Assessment  DOCUMENTATION CODES:   Not applicable  INTERVENTION:   If unable to extubate within 24 hours, recommend initiation of TF via OG tube  Tube Feeding Recommendations:    With current diprivan rate of 16.4 ml/hr, recommend Vital High Protein @ 55 ml/hr with Prostat 30 mL BID providing 146 g of protein, 1520 kcals, 1108 mL of free water. Additional kcals from diprivan. Meets 100% estimated calorie and protein needs. Pt appropriate for PEPuP  NUTRITION DIAGNOSIS:   Inadequate oral intake related to acute illness as evidenced by NPO status.  GOAL:   Provide needs based on ASPEN/SCCM guidelines  MONITOR:   Vent status, Labs, Weight trends  REASON FOR ASSESSMENT:   Ventilator    ASSESSMENT:   71 yo male admitted with R MCA thrombotic CVA s/p thrombectomy and R carotid stenosis/occlusion s/p stended angioplasty. SAH post op. Pt with hx of Hepatitis C  CT and MRI head pending OG tube in place  Patient is currently intubated on ventilator support MV: 6.1 L/min Temp (24hrs), Avg:98 F (36.7 C), Min:94.6 F (34.8 C), Max:100.5 F (38.1 C)  Propofol: 16.4 ml/hr (433 kcals in 24 hours)  Active order for Cleviprex but not infusing at present  Noted no plan for extubation until sheath out and pt able to sit up; plan to start TF in AM if not extubated  No family at bedside, pt sedated. Unable to assess weight or diet history PTA at this time.   Nutrition-Focused physical exam completed. Findings are WDL for fat depletion, muscle depletion, and edema.   Labs: reviewed Meds: NS at 75 ml/hr, cleviprex, diprivan  Diet Order:  Diet NPO time specified Diet NPO time specified  Skin:  Reviewed, no issues  Last BM:  PTA  Height:   Ht Readings from Last 1 Encounters:  11/25/16 5\' 10"  (1.778 m)    Weight:   Wt Readings from Last 1 Encounters:  11/25/16 213 lb 10 oz (96.9 kg)    Ideal Body Weight:  75.5 kg  BMI:  Body mass index is 30.65  kg/m.  Estimated Nutritional Needs:   Kcal:  1994 kcals  Protein:  145-175 g  Fluid:  >/= 2 L  EDUCATION NEEDS:   No education needs identified at this time  Romelle StarcherCate Raeven Pint MS, RD, LDN [phone] Pager  [phone] Weekend/On-Call Pager

## 2016-11-26 NOTE — Progress Notes (Signed)
  SLP Cancellation Note  Patient Details Name: Jason Nolan MRN: [id number] DOB: April 03, 1946   Cancelled treatment:       Reason Eval/Treat Not Completed: Medical issues which prohibited therapy   [name], [name] 11/26/2016, 9:34 AM

## 2016-11-26 NOTE — Progress Notes (Addendum)
1400: Pt Jason Nolan BP not correlating with cuff despite multiple interventions.  Manual cuff pressure taken, correlating closely with cuff pressure.  Dr. Corliss Skainseveshwar advised to treat according to this pressure.

## 2016-11-26 NOTE — Progress Notes (Signed)
Patient ID: Jason Nolan, male   DOB: 1945-05-30, 71 y.o.   MRN: [id number]    Referring Physician(s): Dr. Delia Heady  Supervising Physician: Julieanne Cotton  Patient Status: [facility] - In-pt  Chief Complaint: CVA  Subjective: Patient intubated and sedate right now as BP went up to 200 when he was awakened earlier.  He does move all 4 extremities per RN, but weaker on left side  Allergies: Patient has no known allergies.  Medications: Prior to Admission medications   Medication Sig Start Date End Date Taking? Authorizing Provider  bimatoprost (LUMIGAN) 0.01 % SOLN Place 1 drop into both eyes at bedtime.    [provider]  PAZEO 0.7 % SOLN Place 1 drop into both eyes daily.  10/11/16   [provider]  SIMBRINZA 1-0.2 % SUSP Place 1 drop into the right eye 2 (two) times daily. 09/04/16   [provider]  VYZULTA 0.024 % SOLN Apply 1 drop to eye 2 (two) times daily.  11/12/16   [provider]    Vital Signs: BP (!) 110/52   Pulse 79   Temp (!) 100.5 F (38.1 C) (Axillary) Comment: tykenol given  Resp 13   Ht 5\' 10"  (1.778 m)   Wt 213 lb 10 oz (96.9 kg)   SpO2 100%   BMI 30.65 kg/m   Physical Exam: Skin: R CFA site with sheath still in place.  Minimal old blood on drainage, no new oozing. Neuro: unable to examine as he is sedated right now.  Palpable pedal pulses bilaterally  Imaging: Ct Angio Head W Or Wo Contrast  Result Date: 11/25/2016 CLINICAL DATA:  Initial evaluation for acute dysarthria, stroke. EXAM: CT ANGIOGRAPHY HEAD AND NECK CT PERFUSION BRAIN TECHNIQUE: Multidetector CT imaging of the head and neck was performed using the standard protocol during bolus administration of intravenous contrast. Multiplanar CT image reconstructions and MIPs were obtained to evaluate the vascular anatomy. Carotid stenosis measurements (when applicable) are obtained utilizing NASCET criteria, using the distal internal carotid diameter as the  denominator. Multiphase CT imaging of the brain was performed following IV bolus contrast injection. Subsequent parametric perfusion maps were calculated using RAPID software. CONTRAST:  100 cc of Isovue 370. COMPARISON:  Prior head CT from earlier the same day. 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 No evidence for hemorrhage or mass effect. Overall, this is stable to minimally progressed from scan performed earlier the same day and. No other acute large vessel territory infarct. No acute intracranial hemorrhage. No mass lesion, midline shift or mass effect. No hydrocephalus. No extra-axial fluid collection. Vascular: Focal hyperdensity within distal right M1/proximal right M2 branches, consistent with thrombus. Vascular calcifications noted within the carotid siphons. Skull: Scalp soft tissues and calvarium within normal limits. Sinuses/Orbits: Globes and orbital soft tissues within normal limits. Mild mucosal thickening within the maxillary sinuses. Paranasal sinuses are otherwise clear. No mastoid effusion. ASPECTS [facility] Stroke Program Early CT Score) - Ganglionic level infarction (caudate, lentiform nuclei, internal capsule, insula, M1-M3 cortex): 5 - Supraganglionic infarction (M4-M6 cortex): 2 Total score (0-10 with 10 being normal): 7 Review of the MIP images confirms the above findings CTA NECK FINDINGS Aortic arch: Visualized aortic arch of normal caliber with normal branch pattern. Minimal plaque within the arch itself. No flow-limiting stenosis about the origin of the great vessels. Visualized subclavian arteries widely patent. Right carotid system: Right common  carotid artery widely patent from its origin to the bifurcation. A centric calcified plaque about the right bifurcation/proximal right ICA. There is abrupt near occlusion of the proximal right ICA at the  bifurcation (series 12, image 202). A focal string sign is present. Intraluminal filling defect just distally consistent with thrombus. Area of involvement begins at the bifurcation and measures approximately 15 mm in length. A focal ruptured plaque with associated acute dissection is suspected. Distally, the right ICA is somewhat diminutive in caliber and irregular, but is patent to the skullbase without additional focal stenosis or other vascular abnormality. Left carotid system: Left common carotid artery widely patent from its origin to the bifurcation. Minimal plaque about the left bifurcation without stenosis. Left ICA widely patent distally to the skullbase without stenosis, dissection, or occlusion. Vertebral arteries: Left vertebral artery arises separately from the aortic arch. Right vertebral artery rises from the right subclavian artery. Right vertebral artery slightly dominant. Vertebral arteries patent within the neck without stenosis, dissection, or occlusion. Skeleton: No acute osseus abnormality. No worrisome lytic or blastic osseous lesions. Mild to moderate multilevel degenerative spondylolysis within the cervical spine, greatest at C5-6. Other neck: No acute soft tissue abnormality within the neck. Salivary glands normal. No adenopathy. Thyroid normal. Upper chest: Visualized upper chest within normal limits. Visualized lungs are clear. Review of the MIP images confirms the above findings CTA HEAD FINDINGS Anterior circulation: Petrous, cavernous, and supraclinoid segments patent bilaterally without flow-limiting stenosis. Minimal plaque noted within the cavernous ICAs. ICA termini widely patent. A1 segments patent bilaterally. Anterior communicating artery normal. Anterior cerebral artery is patent to their distal aspects. Right M1 segment patent to its distal aspect. There is occlusion of proximal right M2 branch at the base of the right sylvian fissure (series 12, image 96), likely embolic  from thrombus seen in the neck. Right MCA branches demonstrate multifocal irregularity with stenoses distally. Left M1 segment patent to its distal aspect. No proximal left M2 occlusion. Left MCA branches well opacified to their distal aspects. Posterior circulation: Vertebral artery's patent to the vertebrobasilar junction without stenosis. Patent left PICA. Dominant right AICA. Basilar artery widely patent to its distal aspect. Superior cerebral arteries patent bilaterally. PCA supplied via the basilar as well is prominent bilateral posterior communicating arteries. PCAs patent to their distal aspects. Venous sinuses: Patent. Anatomic variants: No significant anatomic variant. No aneurysm or vascular malformation. Delayed phase: Not performed. Review of the MIP images confirms the above findings CT Brain Perfusion Findings: CBF (<30%) Volume: 0mL Perfusion (Tmax>6.0s) volume: 86mL Mismatch Volume: 86mL Infarction Location:While no core infarct is registered, there is evidence of evolving ischemia within the right insula and frontal operculum on prior noncontrast head CT. This is suspected to be mass by collateralization. Evidence of moderate surrounding penumbra within the right frontal lobe on associated perfusion map. IMPRESSION: 1. 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 Right ICA somewhat diffusely narrow and irregular distally to the skullbase. 2. Positive CTA for emergent large vessel occlusion with right M2 occlusion and evolving right frontal lobe infarct involving the right insula and frontal operculum. Moderate surrounding penumbra as above. 3. Additional mild for age atherosclerotic changes above. No other high-grade or correctable stenosis. Critical Value/emergent results were called by telephone at the time of interpretation on 11/25/2016 at 4:16 pm to Dr.  Milon DikesASHISH ARORA , who verbally acknowledged these results. Electronically Signed   By:  Rise Mu M.D.   On: 11/25/2016 16:53   Ct Head Wo Contrast  Result Date: 11/25/2016 CLINICAL DATA:  Follow-up exam status post catheter directed thrombectomy. EXAM: CT HEAD WITHOUT CONTRAST TECHNIQUE: Contiguous axial images were obtained from the base of the skull through the vertex without intravenous contrast. COMPARISON:  Prior CTs from earlier the same day. FINDINGS: Brain: Serpiginous hyperdensity seen layering throughout the right sylvian fissure and multiple cortical sulci of the overlying right frontal lobe, likely reflecting contrast leakage from recent arteriogram. Superimposed hemorrhage not excluded. Evolving hypodensity within the right insula an overlying operculum and right frontal lobe consistent with right MCA territory stroke. Overall, this has slightly evolved from previous. No other acute intracranial hemorrhage. No other acute large vessel territory infarct. No mass lesion or midline shift. No hydrocephalus. No extra-axial fluid collection. Vascular: Diffuse hyperdensity throughout the intracranial vasculature, like related IV contrast. Skull: Scalp soft tissues and calvarium within normal limits. Sinuses/Orbits: Globes and orbital soft tissues within normal limits. Scattered mucosal thickening within the ethmoidal air cells, sphenoid sinuses, and maxillary sinuses. Mastoid air cells remain clear. IMPRESSION: 1. Serpiginous subarachnoid hyperdensity within the right sylvian fissure and overlying right frontal lobe, most consistent with contrast leakage from recent catheter directed arteriogram. Superimposed hemorrhage not entirely excluded. Attention at follow-up recommended. 2. Evolving acute right MCA territory infarct. Electronically Signed   By: Rise Mu M.D.   On: 11/25/2016 20:27   Ct Head Wo Contrast  Result Date: 11/25/2016 CLINICAL DATA:  71 year old who woke up this  morning with facial droop, left upper extremity weakness and slurred speech. EXAM: CT HEAD WITHOUT CONTRAST TECHNIQUE: Contiguous axial images were obtained from the base of the skull through the vertex without intravenous contrast. COMPARISON:  None. FINDINGS: Brain: Ventricular system normal in size and appearance for age. Mild changes of small vessel disease of the white matter. Asymmetric low attenuation involving the white matter of the right superior temporal lobe extending into the right frontal lobe. No associated hemorrhage or hematoma. No extra-axial fluid collections. No visible mass lesion. No midline shift. Mild cerebellar vermian atrophy. No cortical atrophy. Vascular: Hyperdense right middle cerebral artery. Mild bilateral carotid siphon atherosclerosis. Skull: No skull fracture or other focal osseous abnormality involving the skull. Sinuses/Orbits: Visualized paranasal sinuses, bilateral mastoid air cells and bilateral middle ear cavities well-aerated. Visualized orbits and globes are normal. Other: None. IMPRESSION: 1. 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 Hyperdense right middle cerebral artery indicates acute or subacute occlusion. 2. No associated hemorrhage, hematoma, mass effect or midline shift at this time. I personally telephoned these results at the time of interpretation on 11/25/2016 at 1:51 pm to Dr. Rockne Menghini, who verbally acknowledged these results. Electronically Signed   By: Hulan Saas M.D.   On: 11/25/2016 13:53   Ct Angio Neck W Or Wo Contrast  Result Date: 11/25/2016 CLINICAL DATA:  Initial evaluation for acute dysarthria, stroke. EXAM: CT ANGIOGRAPHY HEAD AND NECK CT PERFUSION BRAIN TECHNIQUE: Multidetector CT imaging of the head and neck was performed using the standard protocol during bolus administration of intravenous contrast. Multiplanar CT image reconstructions and MIPs were obtained to evaluate the vascular anatomy. Carotid  stenosis measurements (when applicable) are obtained utilizing NASCET criteria, using the distal internal carotid diameter as the denominator. Multiphase CT imaging of the brain was performed following IV bolus contrast injection. Subsequent parametric perfusion maps were calculated using RAPID software. CONTRAST:  100 cc of Isovue 370. COMPARISON:  Prior head CT from  earlier the same day. 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 No evidence for hemorrhage or mass effect. Overall, this is stable to minimally progressed from scan performed earlier the same day and. No other acute large vessel territory infarct. No acute intracranial hemorrhage. No mass lesion, midline shift or mass effect. No hydrocephalus. No extra-axial fluid collection. Vascular: Focal hyperdensity within distal right M1/proximal right M2 branches, consistent with thrombus. Vascular calcifications noted within the carotid siphons. Skull: Scalp soft tissues and calvarium within normal limits. Sinuses/Orbits: Globes and orbital soft tissues within normal limits. Mild mucosal thickening within the maxillary sinuses. Paranasal sinuses are otherwise clear. No mastoid effusion. ASPECTS [facility] Stroke Program Early CT Score) - Ganglionic level infarction (caudate, lentiform nuclei, internal capsule, insula, M1-M3 cortex): 5 - Supraganglionic infarction (M4-M6 cortex): 2 Total score (0-10 with 10 being normal): 7 Review of the MIP images confirms the above findings CTA NECK FINDINGS Aortic arch: Visualized aortic arch of normal caliber with normal branch pattern. Minimal plaque within the arch itself. No flow-limiting stenosis about the origin of the great vessels. Visualized subclavian arteries widely patent. Right carotid system: Right common carotid artery widely patent from its origin to the bifurcation. A centric  calcified plaque about the right bifurcation/proximal right ICA. There is abrupt near occlusion of the proximal right ICA at the bifurcation (series 12, image 202). A focal string sign is present. Intraluminal filling defect just distally consistent with thrombus. Area of involvement begins at the bifurcation and measures approximately 15 mm in length. A focal ruptured plaque with associated acute dissection is suspected. Distally, the right ICA is somewhat diminutive in caliber and irregular, but is patent to the skullbase without additional focal stenosis or other vascular abnormality. Left carotid system: Left common carotid artery widely patent from its origin to the bifurcation. Minimal plaque about the left bifurcation without stenosis. Left ICA widely patent distally to the skullbase without stenosis, dissection, or occlusion. Vertebral arteries: Left vertebral artery arises separately from the aortic arch. Right vertebral artery rises from the right subclavian artery. Right vertebral artery slightly dominant. Vertebral arteries patent within the neck without stenosis, dissection, or occlusion. Skeleton: No acute osseus abnormality. No worrisome lytic or blastic osseous lesions. Mild to moderate multilevel degenerative spondylolysis within the cervical spine, greatest at C5-6. Other neck: No acute soft tissue abnormality within the neck. Salivary glands normal. No adenopathy. Thyroid normal. Upper chest: Visualized upper chest within normal limits. Visualized lungs are clear. Review of the MIP images confirms the above findings CTA HEAD FINDINGS Anterior circulation: Petrous, cavernous, and supraclinoid segments patent bilaterally without flow-limiting stenosis. Minimal plaque noted within the cavernous ICAs. ICA termini widely patent. A1 segments patent bilaterally. Anterior communicating artery normal. Anterior cerebral artery is patent to their distal aspects. Right M1 segment patent to its distal aspect.  There is occlusion of proximal right M2 branch at the base of the right sylvian fissure (series 12, image 96), likely embolic from thrombus seen in the neck. Right MCA branches demonstrate multifocal irregularity with stenoses distally. Left M1 segment patent to its distal aspect. No proximal left M2 occlusion. Left MCA branches well opacified to their distal aspects. Posterior circulation: Vertebral artery's patent to the vertebrobasilar junction without stenosis. Patent left PICA. Dominant right AICA. Basilar artery widely patent to its distal aspect. Superior cerebral arteries patent bilaterally. PCA supplied via the basilar as well is prominent  bilateral posterior communicating arteries. PCAs patent to their distal aspects. Venous sinuses: Patent. Anatomic variants: No significant anatomic variant. No aneurysm or vascular malformation. Delayed phase: Not performed. Review of the MIP images confirms the above findings CT Brain Perfusion Findings: CBF (<30%) Volume: 0mL Perfusion (Tmax>6.0s) volume: 86mL Mismatch Volume: 86mL Infarction Location:While no core infarct is registered, there is evidence of evolving ischemia within the right insula and frontal operculum on prior noncontrast head CT. This is suspected to be mass by collateralization. Evidence of moderate surrounding penumbra within the right frontal lobe on associated perfusion map. IMPRESSION: 1. 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 Right ICA somewhat diffusely narrow and irregular distally to the skullbase. 2. Positive CTA for emergent large vessel occlusion with right M2 occlusion and evolving right frontal lobe infarct involving the right insula and frontal operculum. Moderate surrounding penumbra as above. 3. Additional mild for age atherosclerotic changes above. No other high-grade or correctable  stenosis. Critical Value/emergent results were called by telephone at the time of interpretation on 11/25/2016 at 4:16 pm to Dr. Milon Dikes , who verbally acknowledged these results. Electronically Signed   By: Rise Mu M.D.   On: 11/25/2016 16:53   Ct Cerebral Perfusion W Contrast  Result Date: 11/25/2016 CLINICAL DATA:  Initial evaluation for acute dysarthria, stroke. EXAM: CT ANGIOGRAPHY HEAD AND NECK CT PERFUSION BRAIN TECHNIQUE: Multidetector CT imaging of the head and neck was performed using the standard protocol during bolus administration of intravenous contrast. Multiplanar CT image reconstructions and MIPs were obtained to evaluate the vascular anatomy. Carotid stenosis measurements (when applicable) are obtained utilizing NASCET criteria, using the distal internal carotid diameter as the denominator. Multiphase CT imaging of the brain was performed following IV bolus contrast injection. Subsequent parametric perfusion maps were calculated using RAPID software. CONTRAST:  100 cc of Isovue 370. COMPARISON:  Prior head CT from earlier the same day. 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 No evidence for hemorrhage or mass effect. Overall, this is stable to minimally progressed from scan performed earlier the same day and. No other acute large vessel territory infarct. No acute intracranial hemorrhage. No mass lesion, midline shift or mass effect. No hydrocephalus. No extra-axial fluid collection. Vascular: Focal hyperdensity within distal right M1/proximal right M2 branches, consistent with thrombus. Vascular calcifications noted within the carotid siphons. Skull: Scalp soft tissues and calvarium within normal limits. Sinuses/Orbits: Globes and orbital soft tissues within normal limits. Mild mucosal thickening within the maxillary sinuses. Paranasal sinuses  are otherwise clear. No mastoid effusion. ASPECTS [facility] Stroke Program Early CT Score) - Ganglionic level infarction (caudate, lentiform nuclei, internal capsule, insula, M1-M3 cortex): 5 - Supraganglionic infarction (M4-M6 cortex): 2 Total score (0-10 with 10 being normal): 7 Review of the MIP images confirms the above findings CTA NECK FINDINGS Aortic arch: Visualized aortic arch of normal caliber with normal branch pattern. Minimal plaque within the arch itself. No flow-limiting stenosis about the origin of the great vessels. Visualized subclavian arteries widely patent. Right carotid system: Right common carotid artery widely patent from its origin to the bifurcation. A centric calcified plaque about the right bifurcation/proximal right ICA. There is abrupt near occlusion of the proximal right ICA at the bifurcation (series 12, image 202). A focal string sign is present. Intraluminal filling  defect just distally consistent with thrombus. Area of involvement begins at the bifurcation and measures approximately 15 mm in length. A focal ruptured plaque with associated acute dissection is suspected. Distally, the right ICA is somewhat diminutive in caliber and irregular, but is patent to the skullbase without additional focal stenosis or other vascular abnormality. Left carotid system: Left common carotid artery widely patent from its origin to the bifurcation. Minimal plaque about the left bifurcation without stenosis. Left ICA widely patent distally to the skullbase without stenosis, dissection, or occlusion. Vertebral arteries: Left vertebral artery arises separately from the aortic arch. Right vertebral artery rises from the right subclavian artery. Right vertebral artery slightly dominant. Vertebral arteries patent within the neck without stenosis, dissection, or occlusion. Skeleton: No acute osseus abnormality. No worrisome lytic or blastic osseous lesions. Mild to moderate multilevel degenerative  spondylolysis within the cervical spine, greatest at C5-6. Other neck: No acute soft tissue abnormality within the neck. Salivary glands normal. No adenopathy. Thyroid normal. Upper chest: Visualized upper chest within normal limits. Visualized lungs are clear. Review of the MIP images confirms the above findings CTA HEAD FINDINGS Anterior circulation: Petrous, cavernous, and supraclinoid segments patent bilaterally without flow-limiting stenosis. Minimal plaque noted within the cavernous ICAs. ICA termini widely patent. A1 segments patent bilaterally. Anterior communicating artery normal. Anterior cerebral artery is patent to their distal aspects. Right M1 segment patent to its distal aspect. There is occlusion of proximal right M2 branch at the base of the right sylvian fissure (series 12, image 96), likely embolic from thrombus seen in the neck. Right MCA branches demonstrate multifocal irregularity with stenoses distally. Left M1 segment patent to its distal aspect. No proximal left M2 occlusion. Left MCA branches well opacified to their distal aspects. Posterior circulation: Vertebral artery's patent to the vertebrobasilar junction without stenosis. Patent left PICA. Dominant right AICA. Basilar artery widely patent to its distal aspect. Superior cerebral arteries patent bilaterally. PCA supplied via the basilar as well is prominent bilateral posterior communicating arteries. PCAs patent to their distal aspects. Venous sinuses: Patent. Anatomic variants: No significant anatomic variant. No aneurysm or vascular malformation. Delayed phase: Not performed. Review of the MIP images confirms the above findings CT Brain Perfusion Findings: CBF (<30%) Volume: 0mL Perfusion (Tmax>6.0s) volume: 86mL Mismatch Volume: 86mL Infarction Location:While no core infarct is registered, there is evidence of evolving ischemia within the right insula and frontal operculum on prior noncontrast head CT. This is suspected to be mass  by collateralization. Evidence of moderate surrounding penumbra within the right frontal lobe on associated perfusion map. IMPRESSION: 1. 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 Right ICA somewhat diffusely narrow and irregular distally to the skullbase. 2. Positive CTA for emergent large vessel occlusion with right M2 occlusion and evolving right frontal lobe infarct involving the right insula and frontal operculum. Moderate surrounding penumbra as above. 3. Additional mild for age atherosclerotic changes above. No other high-grade or correctable stenosis. Critical Value/emergent results were called by telephone at the time of interpretation on 11/25/2016 at 4:16 pm to Dr. Milon Dikes , who verbally acknowledged these results. Electronically Signed   By: Rise Mu M.D.   On: 11/25/2016 16:53   Portable Chest Xray  Result Date: 11/25/2016 CLINICAL DATA:  Intubation EXAM: PORTABLE CHEST 1 VIEW COMPARISON:  None. FINDINGS: Endotracheal tube tip is about 4.3 cm superior to the carina. Esophageal tube tip is  in the left upper quadrant. Low lung volumes. Atelectasis or mild infiltrate at the left lung base. Normal heart size. Mediastinal with upper normal, likely augmented by low lung volume. No pneumothorax. IMPRESSION: 1. Endotracheal tube tip about 4.3 cm superior to carina 2. Patchy atelectasis or infiltrate at the left lung base 3. Upper normal mediastinal with, likely augmented by low lung volume. Electronically Signed   By: Jasmine Pang M.D.   On: 11/25/2016 20:40    Labs:  CBC:  Recent Labs  01/26/16 1405 11/25/16 1347 11/26/16 0420  WBC 2.8 Repeated and verified X2.* 3.1* 4.5  HGB 15.6 15.4 13.9  HCT 45.9 46.0 42.0  PLT 183.0 188 185    COAGS:  Recent Labs  11/25/16 1347  INR 1.04  APTT 33    BMP:  Recent Labs   01/26/16 1405 11/25/16 0116 11/25/16 1347 11/26/16 0420  NA 139  --  136 139  K 4.0  --  4.1 3.9  CL 103  --  104 110  CO2 29  --  24 23  GLUCOSE 77  --  98 109*  BUN 16  --  14 8  CALCIUM 8.9  --  9.3 7.9*  CREATININE 1.04 1.03 1.01 0.99  GFRNONAA  --  >60 >60 >60  GFRAA  --  >60 >60 >60    LIVER FUNCTION TESTS:  Recent Labs  01/26/16 1405 11/25/16 1347  BILITOT 0.8 0.8  AST 18 25  ALT 20 23  ALKPHOS 52 52  PROT 7.2 7.6  ALBUMIN 4.2 4.4    Assessment and Plan: 1.Complete revascularization of occluded  Sup division of RT MCA with x1 pass with 4mm x 40 mm solitaire  FR retrieval device achieving a TICI 2b reperfusion. 2.Revascularization of symptomatic near complete occlusion  Of Rt ICA proximally with stent assisted angioplasty.  P2Y12 is 252 today.  We will await his CT of his head.  As long as there is no evidence of hemorrhage, then we will switch him to Brilinta 90mg  BID Patient remains intubated and will plan for extubation once his sheath is removed.   Will continue to follow  Electronically Signed: Erasto Sleight E 11/26/2016, 10:44 AM   I spent a total of 15 Minutes at the the patient's bedside AND on the patient's hospital floor or unit, greater than 50% of which was counseling/coordinating care for CVA

## 2016-11-26 NOTE — Progress Notes (Signed)
STROKE TEAM PROGRESS NOTE   HISTORY OF PRESENT ILLNESS (per record) Jason Nolan is a 71 y.o. left-handed male who has a past medical history of hepatitis C treated with Harvoni, who presented with Left-sided weakness, slurred speech, left facial droop.  He was in his usual state of health and last normal at 2 AM when he went to bed, woke up on the morning of 11/25/2016 around 9 AM with left-sided weakness, left facial droop and slurred speech. 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 His NIH stroke scale at that hospital was recorded to be 6.  Noncontrast head CT showed early changes of ischemia in the right MCA territory and a hyperdense right MCA.  He was transferred over to [facility] for evaluation for possible thrombectomy.  He was not given IV TPA because she was outside of the TPA window at the time of presentation.  The patient is now s/p thrombectomy and revascularization of superior division of R MCA with T1C1 reperfusion.    LKW: 0200 on 11/25/2016 ICH Score: Not applicable Modified Rankin Score: 0  Patient was not administered IV t-PA secondary to arriving outside of the treatment widnow. He was admitted to the neuro ICU for further evaluation and treatment.   SUBJECTIVE (INTERVAL HISTORY) His nurse is at the bedside.  The patient is intubated and on sedation.  With sedation off he rouses easily and follows commands appropriately.  The patient arrived to the ICU with an IR sheath in place.  Sheath pulled at 1330pm with hemostasis.   OBJECTIVE Temp:  [94.6 F (34.8 C)-98.4 F (36.9 C)] 98.4 F (36.9 C) (07/24 0400) Pulse Rate:  [53-95] 56 (07/24 0700) Resp:  [0-27] 16 (07/24 0700) BP: (76-199)/(45-110) 105/56 (07/24 0630) SpO2:  [97 %-100 %] 100 % (07/24 0700) Arterial Line BP: (93-214)/(40-93) 124/46 (07/24 0700) FiO2 (%):  [40 %-100 %] 40 % (07/24 0408) Weight:  [90.7 kg (200 lb)-96.9 kg (213 lb  10 oz)] 96.9 kg (213 lb 10 oz) (07/23 2100)  CBC:  Recent Labs Lab 11/25/16 1347 11/26/16 0420  WBC 3.1* 4.5  NEUTROABS 1.7 2.9  HGB 15.4 13.9  HCT 46.0 42.0  MCV 90.6 91.3  PLT 188 185    Basic Metabolic Panel:  Recent Labs Lab 11/25/16 1347 11/26/16 0420  NA 136 139  K 4.1 3.9  CL 104 110  CO2 24 23  GLUCOSE 98 109*  BUN 14 8  CREATININE 1.01 0.99  CALCIUM 9.3 7.9*    Lipid Panel:    Component Value Date/Time   CHOL 193 11/26/2016 0420   TRIG 197 (H) 11/26/2016 0420   HDL 57 11/26/2016 0420   CHOLHDL 3.4 11/26/2016 0420   VLDL 39 11/26/2016 0420   LDLCALC 97 11/26/2016 0420   HgbA1c: No results found for: HGBA1C Urine Drug Screen: No results found for: LABOPIA, COCAINSCRNUR, LABBENZ, AMPHETMU, THCU, LABBARB  Alcohol Level No results found for: ETH  IMAGING  Ct Angio Head W Or Wo Contrast Ct Angio Neck W Or Wo Contrast 11/25/2016 IMPRESSION: 1. 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 Right ICA somewhat diffusely narrow and irregular distally to the skullbase. 2. Positive CTA for emergent large vessel occlusion with right M2 occlusion and evolving right frontal lobe infarct involving the right insula and frontal operculum. Moderate surrounding penumbra as  above. 3. Additional mild for age atherosclerotic changes above. No other high-grade or correctable stenosis.  Ct Head Wo Contrast  11/25/2016 IMPRESSION: 1. Serpiginous subarachnoid hyperdensity within the right sylvian fissure and overlying right frontal lobe, most consistent with contrast leakage from recent catheter directed arteriogram. Superimposed hemorrhage not entirely excluded. Attention at follow-up recommended. 2. Evolving acute right MCA territory infarct.  Ct Head Wo Contrast 11/25/2016 IMPRESSION: 1. Early acute right middle cerebral artery distribution  stroke. Hyperdense right middle cerebral artery indicates acute or subacute occlusion. 2. No associated hemorrhage, hematoma, mass effect or midline shift at this time.  Ct Cerebral Perfusion W Contrast  11/25/2016 IMPRESSION: 1. 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 Right ICA somewhat diffusely narrow and irregular distally to the skullbase. 2. Positive CTA for emergent large vessel occlusion with right M2 occlusion and evolving right frontal lobe infarct involving the right insula and frontal operculum. Moderate surrounding penumbra as above. 3. Additional mild for age atherosclerotic changes above. No other high-grade or correctable stenosis.  Portable Chest Xray 11/25/2016 IMPRESSION: 1. Endotracheal tube tip about 4.3 cm superior to carina 2. Patchy atelectasis or infiltrate at the left lung base 3. Upper normal mediastinal with, likely augmented by low lung volume.  2D Echo 11/26/2016 Study Conclusions - Left ventricle: The cavity size was normal. Systolic function was   normal. The estimated ejection fraction was in the range of 60%   to 65%. Wall motion was normal; there were no regional wall   motion abnormalities. Left ventricular diastolic function   parameters were normal.  Carotid US 11/26/2016  pending      PHYSICAL EXAM Pleasant middle aged african [place] male not in distress.intubated. Rt groin arterial sheath inplace during am rounds. . Afebrile. Head is nontraumatic. Neck is supple without bruit.    Cardiac exam no murmur or gallop. Lungs are clear to auscultation. Distal pulses are well felt. Neurological Exam :  Intubated. Drowsy but opens eyes easily and follows simple midline commands as well as moves all 4 extremities to command. Pupils 4 mm equal reactive. Fundi not visualized. Blinks to threat more on the right than  the left. Mild right lower facial weakness. Tongue midline. Motor system exam antigravity good strength on the right. Mild withdrawal in the left lower and upper extremity but left hemiplegia. Deep tendon flexes are depressed on the left compared to the right. Right plantar downgoing left is upgoing. Gait not tested. ASSESSMENT/PLAN [name] is a 71 y.o. male with history of hepatitis C treated with Harvoni, who presented with Left-sided weakness, slurred speech, left facial droop.  He did not receive IV t-PA due to arriving outside of the treatment window.   Stroke: Early acute right middle cerebral artery distribution stroke, likely embolic in setting of ruptured plaque in R ICA with associated intraluminal dissection and thrombus status post right occipital ICA angioplasty and stenting with thrombectomy of the right superior division middle cerebral artery using solitaire device   Resultant  left hemiplegia  CT head: Early acute right middle cerebral artery distribution stroke  MRI head:   ordered     Carotid Doppler   pending  2D Echo: EF 60-65%. No source of embolus   LDL 97  HgbA1c pending   SCDs for VTE prophylaxis  Diet NPO time specified  Diet NPO time specified  No antithrombotic prior to admission, now on aspirin  325 mg daily and clopidogrel 75 mg daily  Patient counseled to be compliant with his antithrombotic medications  Ongoing aggressive stroke risk factor management  Therapy recommendations:   pending  Disposition:   pending  Hypertension  Stable  Permissive hypertension (OK if < 220/120) but gradually normalize in 5-7 days  Long-term BP goal normotensive  Hyperlipidemia  Home meds: none  LDL 97, goal < 70  Statin presently held due to  ?  Diabetes  HgbA1c  pending, goal < 7.0  Controlled  Other Stroke Risk Factors  Advanced age  Obesity, Body mass index is 30.65 kg/m., recommend weight loss, diet and exercise as appropriate    Other Active Problems  Fever: Tmax 100.5, trending down.  Now 100.  Hospital day # 1  I have personally examined this patient, reviewed notes, independently viewed imaging studies, participated in medical decision making and plan of care.ROS completed by me personally and pertinent positives fully documented  I have made any additions or clarifications directly to the above note. He presented with left hemiplegia secondary to embolic occlusion of the superior division of the right middle cerebral artery from high-grade proximal right ICA stenosis. He underwent emergent angioplasty and stenting of the proximal right ICA stenosis with mechanical embolectomy of the occluded superior division of the right MCA. Recommends strict blood pressure control and close neurological monitoring as per post intervention protocol. Discontinue groin sheath and consider extubation later if tolerated. Check MRI scan of the brain later this evening. Continue aspirin and Plavix for carotid stent aspirin neuro interventional team recommendations. Family not available at the bedside for discussion. Discussed with Dr. Molli KnockYacoub and Dr. Corliss Skainseveshwar. This patient is critically ill and at significant risk of neurological worsening, death and care requires constant monitoring of vital signs, hemodynamics,respiratory and cardiac monitoring, extensive review of multiple databases, frequent neurological assessment, discussion with family, other specialists and medical decision making of high complexity.I have made any additions or clarifications directly to the above note.This critical care time does not reflect procedure time, or teaching time or supervisory time of PA/NP/Med Resident etc but could involve care discussion time.  I spent 50 minutes of neurocritical care time  in the care of  this patient.      Delia HeadyPramod Madyn Ivins, MD Medical Director [facility] Pager: [phone] 11/26/2016 6:08 PM   To contact Stroke  Continuity provider, please refer to [email or web address]. After hours, contact General Neurology

## 2016-11-27 ENCOUNTER — Inpatient Hospital Stay (HOSPITAL_COMMUNITY): Payer: Medicare Other

## 2016-11-27 ENCOUNTER — Encounter (HOSPITAL_COMMUNITY): Payer: Self-pay | Admitting: Interventional Radiology

## 2016-11-27 LAB — BASIC METABOLIC PANEL
Anion gap: 8 (ref 5–15)
BUN: 6 mg/dL (ref 6–20)
CHLORIDE: 108 mmol/L (ref 101–111)
CO2: 24 mmol/L (ref 22–32)
Calcium: 7.2 mg/dL — ABNORMAL LOW (ref 8.9–10.3)
Creatinine, Ser: 1.05 mg/dL (ref 0.61–1.24)
GFR calc Af Amer: >60 mL/min (ref >60)
GFR calc non Af Amer: >60 mL/min (ref >60)
GLUCOSE: 112 mg/dL — AB (ref 65–99)
POTASSIUM: 3.9 mmol/L (ref 3.5–5.1)
Sodium: 140 mmol/L (ref 135–145)

## 2016-11-27 LAB — BLOOD GAS, ARTERIAL
ACID-BASE DEFICIT: 0.7 mmol/L (ref 0.0–2.0)
BICARBONATE: 23.3 mmol/L (ref 20.0–28.0)
Drawn by: 419771
FIO2: 40
MECHVT: 580 mL
O2 Saturation: 98.6 %
PEEP/CPAP: 5 cmH2O
PH ART: 7.41 (ref 7.350–7.450)
Patient temperature: 98.6
RATE: 16 resp/min
pCO2 arterial: 37.5 mmHg (ref 32.0–48.0)
pO2, Arterial: 135 mmHg — ABNORMAL HIGH (ref 83.0–108.0)

## 2016-11-27 LAB — HEMOGLOBIN A1C
Hgb A1c MFr Bld: 5.4 % (ref 4.8–5.6)
Mean Plasma Glucose: 108 mg/dL

## 2016-11-27 LAB — CBC
HEMATOCRIT: 40.9 % (ref 39.0–52.0)
HEMOGLOBIN: 13.2 g/dL (ref 13.0–17.0)
MCH: 29.9 pg (ref 26.0–34.0)
MCHC: 32.3 g/dL (ref 30.0–36.0)
MCV: 92.5 fL (ref 78.0–100.0)
Platelets: 166 10*3/uL (ref 150–400)
RBC: 4.42 MIL/uL (ref 4.22–5.81)
RDW: 14.3 % (ref 11.5–15.5)
WBC: 4.9 10*3/uL (ref 4.0–10.5)

## 2016-11-27 LAB — MAGNESIUM: MAGNESIUM: 1.6 mg/dL — AB (ref 1.7–2.4)

## 2016-11-27 LAB — PHOSPHORUS: Phosphorus: 3.1 mg/dL (ref 2.5–4.6)

## 2016-11-27 MED ORDER — ATORVASTATIN CALCIUM 40 MG PO TABS
40.0000 mg | ORAL_TABLET | Freq: Every day | ORAL | Status: DC
  Administered 2016-11-27 – 2016-11-28 (×2): 40 mg via ORAL
  Filled 2016-11-27 (×2): qty 1

## 2016-11-27 MED ORDER — CHLORHEXIDINE GLUCONATE 0.12 % MT SOLN
OROMUCOSAL | Status: AC
  Filled 2016-11-27: qty 15

## 2016-11-27 MED ORDER — MAGNESIUM SULFATE 2 GM/50ML IV SOLN
2.0000 g | Freq: Once | INTRAVENOUS | Status: AC
  Administered 2016-11-27: 2 g via INTRAVENOUS
  Filled 2016-11-27: qty 50

## 2016-11-27 MED ORDER — ORAL CARE MOUTH RINSE
15.0000 mL | Freq: Two times a day (BID) | OROMUCOSAL | Status: DC
  Administered 2016-11-28 (×2): 15 mL via OROMUCOSAL

## 2016-11-27 MED ORDER — TICAGRELOR 90 MG PO TABS
90.0000 mg | ORAL_TABLET | Freq: Two times a day (BID) | ORAL | Status: DC
  Administered 2016-11-27 – 2016-11-29 (×5): 90 mg via ORAL
  Filled 2016-11-27 (×6): qty 1

## 2016-11-27 MED ORDER — ASPIRIN 81 MG PO CHEW
81.0000 mg | CHEWABLE_TABLET | Freq: Every day | ORAL | Status: DC
  Administered 2016-11-27 – 2016-11-29 (×3): 81 mg via ORAL
  Filled 2016-11-27 (×3): qty 1

## 2016-11-27 MED ORDER — TICAGRELOR 90 MG PO TABS: 90.0000 mg | ORAL_TABLET | Freq: Two times a day (BID) | ORAL | Status: DC

## 2016-11-27 NOTE — Procedures (Signed)
Extubation Procedure Note  Patient Details:   Name: Jason Nolan DOB: 08/18/1945 MRN: [id number]   Airway Documentation:  Airway 7.5 mm (Active)  Secured at (cm) 23 cm 11/27/2016  3:17 AM  Measured From Lips 11/27/2016  3:17 AM  Secured Location Center 11/27/2016  3:17 AM  Secured By [employer] Tube Holder 11/27/2016  3:17 AM  Tube Holder Repositioned Yes 11/27/2016  3:17 AM  Cuff Pressure (cm H2O) 30 cm H2O 11/26/2016  7:16 PM  Site Condition Dry 11/27/2016  3:17 AM    Evaluation  O2 sats: stable throughout Complications: No apparent complications Patient did tolerate procedure well. Bilateral Breath Sounds: Rhonchi   No  PT was not able to speak but has a strong productive cough  Pt was extubated to a 3L [place]  Sats are stable, RT to monitor   Jason Nolan, Jason Nolan 11/27/2016, 9:43 AM

## 2016-11-27 NOTE — Evaluation (Signed)
Speech Language Pathology Evaluation Patient Details Name: Jason Nolan MRN: [id number] DOB: 30-May-1945 Today's Date: 11/27/2016 Time: [id number] SLP Time Calculation (min) (ACUTE ONLY): 20 min  Problem List:  Patient Active Problem List   Diagnosis Date Noted  . Cerebral embolism with cerebral infarction 11/25/2016  . Acute ischemic stroke (HCC) 11/25/2016  . Leukopenia 03/31/2016  . Hepatitis C 07/22/2013   Past Medical History:  Past Medical History:  Diagnosis Date  . Glaucoma   . Hepatitis C    Past Surgical History:  Past Surgical History:  Procedure Laterality Date  . RADIOLOGY WITH ANESTHESIA N/A 11/25/2016   Procedure: RADIOLOGY WITH ANESTHESIA;  Surgeon: Radiologist, Medication, MD;  Location: [facility] OR;  Service: Radiology;  Laterality: N/A;   HPI:  71 year old male with PMH as below, which is significant for Hepatitic C s/p treatment with Harvoni. He was last known normal at 2am on 7/23 when he went to sleep. He woke up around 9am with left sided weakness, facial droop, and slurred speech. [facility] he was found to have a R MCA CVA, but was outside window for tpa. [facility]. He underwent successful revascularization of R MCA and R ICA. Post-procedurally he remained on ventilator and was transferred to ICU. Concern for potential hemorrhage post IR proedure, but may have been extravasation of dye and not a bleed. Intubated 7/23 to 7/25 in am. 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 Pt is left handed.    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 Also suspect a component of apraxia though expressve aphasia also possible (pt is left handed). Pt noted to attempt to repeat single words (Yes, one, two, three, Jason Nolan, Jason VirginiaOK) all with inconsistent articulation efforts and apparent groping movement. Language comprehension also  appear in tact; pt accurately responds to y/n, follow commands and uses gestures to communicate when possible. Recommend CIR at d/c for intesive multidisciplinary interventions to maximize funcitonal gains. Will follow acutely for functional communication.     SLP Assessment  SLP Recommendation/Assessment: Patient needs continued Speech Lanaguage Pathology Services SLP Visit Diagnosis: Apraxia (R48.2);Dysarthria and anarthria (R47.1)    Follow Up Recommendations  Inpatient Rehab    Frequency and Duration min 2x/week  2 weeks      SLP Evaluation Cognition  Overall Cognitive Status: Difficult to assess Arousal/Alertness: Awake/alert Orientation Level: Oriented to person;Oriented to place;Oriented to situation Attention: Sustained Sustained Attention: Appears intact       Comprehension  Auditory Comprehension Overall Auditory Comprehension: Appears within functional limits for tasks assessed Yes/No Questions: Within Functional Limits Commands: Within Functional Limits Conversation: Simple Interfering Components: Visual impairments;Motor planning Reading Comprehension Reading Status: Not tested    Expression Verbal Expression Overall Verbal Expression: Impaired Initiation: Impaired Level of Generative/Spontaneous Verbalization: Word Repetition: Impaired Level of Impairment: Word level Naming: Impairment Interfering Components: Speech intelligibility Non-Verbal Means of Communication: Gestures Written Expression Dominant Hand: Left   Oral / Motor  Oral Motor/Sensory Function Overall Oral Motor/Sensory Function: Severe impairment Facial ROM: Reduced left;Suspected CN VII (facial) dysfunction Facial Symmetry: Abnormal symmetry left;Suspected CN VII (facial) dysfunction Facial Strength: Reduced left;Suspected CN VII (facial) dysfunction Lingual ROM: Reduced left;Suspected CN XII (hypoglossal) dysfunction Lingual Symmetry: Abnormal symmetry left;Suspected CN XII (hypoglossal)  dysfunction Lingual Strength: Reduced;Suspected CN XII (hypoglossal) dysfunction Lingual Sensation: Reduced;Suspected CN VII (facial) dysfunction-anterior 2/3 tongue Mandible: Within Functional Limits Motor Speech Overall Motor Speech:  Impaired Respiration: Within functional limits Phonation: Hoarse;Wet Articulation: Impaired Level of Impairment: Word Intelligibility: Intelligibility reduced Word: 0-24% accurate Motor Planning: Impaired Level of Impairment: Word Motor Speech Errors: Groping for words;Inconsistent   GO                   Jason DittyBonnie Taeshaun Rames, MA CCC-SLP [phone]  Jason Nolan, Jason Nolan 11/27/2016, 2:11 PM

## 2016-11-27 NOTE — Progress Notes (Signed)
OT Cancellation Note  Patient Details Name: Jason Nolan MRN: [id number] DOB: [birth year]   Cancelled Treatment:    Reason Eval/Treat Not Completed: Patient not medically ready. Pt with bedrest orders. Will await increased activity order prior to initiating OT evaluation.   Doristine Sectionharity A Johnasia Liese, MS OTR/L  Pager: [phone]   Doristine SectionCharity A Maleea Camilo 11/27/2016, 11:33 AM

## 2016-11-27 NOTE — Progress Notes (Signed)
Cortrak Tube Team Note:  Consult received to place a Cortrak feeding tube.   A 10 F Cortrak tube was placed in the left nare and secured with a nasal bridle at 75 cm. Per the Cortrak monitor reading the tube tip is gastric.    X-ray is required, abdominal x-ray has been ordered by the Cortrak team. Please confirm tube placement before using the Cortrak tube.   If the tube becomes dislodged please keep the tube and contact the Cortrak team at [email or web address] ([email or web address]) for replacement.  If after hours and replacement cannot be delayed, place a NG tube and confirm placement with an abdominal x-ray.    Vanessa Kickarly Satina Jerrell RD, LDN Clinical Nutrition Pager # [phone]

## 2016-11-27 NOTE — Progress Notes (Signed)
Referring Physician(s):  Dr. 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  Assessed by SLP who recommends patient remain NPO. Cortrak tube placed this afternoon. Attempts to vocalize.    Allergies: Patient has no known allergies.  Medications: Prior to Admission medications   Medication Sig Start Date End Date Taking? Authorizing Provider  PAZEO 0.7 % SOLN Place 1 drop into both eyes daily.  10/11/16  Yes [provider]  VYZULTA 0.024 % SOLN Apply 1 drop to eye 2 (two) times daily.  11/12/16  Yes [provider]     Vital Signs: BP 118/61   Pulse (!) 59   Temp 99.1 F (37.3 C) (Axillary)   Resp 15   Ht 5\' 10"  (1.778 m)   Wt 213 lb 10 oz (96.9 kg)   SpO2 100%   BMI 30.65 kg/m   Physical Exam  NAD, alert, extubated Neuro:  Left-sided facial droop, severe dysarthria, strength 2/5 LUE, 3/5 LLE  Imaging: Ct Angio Head W Or Wo Contrast  Result Date: 11/25/2016 CLINICAL DATA:  Initial evaluation for acute dysarthria, stroke. EXAM: CT ANGIOGRAPHY HEAD AND NECK CT PERFUSION BRAIN TECHNIQUE: Multidetector CT imaging of the head and neck was performed using the standard protocol during bolus administration of intravenous contrast. Multiplanar CT image reconstructions and MIPs were obtained to evaluate the vascular anatomy. Carotid stenosis measurements (when applicable) are obtained utilizing NASCET criteria, using the distal internal carotid diameter as the denominator. Multiphase CT imaging of the brain was performed following IV bolus contrast injection. Subsequent parametric perfusion maps were calculated using RAPID software. CONTRAST:  100 cc of Isovue 370. COMPARISON:  Prior head CT from earlier the same day. FINDINGS: CT HEAD FINDINGS Brain: Evolving acute ischemic right MCA territory infarct involving the right insular  cortex and right frontal operculum (series 6, images 15, 19). No evidence for hemorrhage or mass effect. Overall, this is stable to minimally progressed from scan performed earlier the same day and. No other acute large vessel territory infarct. No acute intracranial hemorrhage. No mass lesion, midline shift or mass effect. No hydrocephalus. No extra-axial fluid collection. Vascular: Focal hyperdensity within distal right M1/proximal right M2 branches, consistent with thrombus. Vascular calcifications noted within the carotid siphons. Skull: Scalp soft tissues and calvarium within normal limits. Sinuses/Orbits: Globes and orbital soft tissues within normal limits. Mild mucosal thickening within the maxillary sinuses. Paranasal sinuses are otherwise clear. No mastoid effusion. ASPECTS [facility] Stroke Program Early CT Score) - Ganglionic level infarction (caudate, lentiform nuclei, internal capsule, insula, M1-M3 cortex): 5 - Supraganglionic infarction (M4-M6 cortex): 2 Total score (0-10 with 10 being normal): 7 Review of the MIP images confirms the above findings CTA NECK FINDINGS Aortic arch: Visualized aortic arch of normal caliber with normal branch pattern. Minimal plaque within the arch itself. No flow-limiting stenosis about the origin of the great vessels. Visualized subclavian arteries widely patent. Right carotid system: Right common carotid artery widely patent from its origin to the bifurcation. A centric calcified plaque about the right bifurcation/proximal right ICA. There is abrupt near occlusion of the proximal right ICA at the bifurcation (series 12, image 202). A focal string sign is present. Intraluminal filling defect just distally consistent with thrombus. Area of involvement begins at the bifurcation and measures approximately 15 mm in length. A focal ruptured plaque with associated acute dissection is suspected. Distally, the right  ICA is somewhat diminutive in caliber and irregular, but is  patent to the skullbase without additional focal stenosis or other vascular abnormality. Left carotid system: Left common carotid artery widely patent from its origin to the bifurcation. Minimal plaque about the left bifurcation without stenosis. Left ICA widely patent distally to the skullbase without stenosis, dissection, or occlusion. Vertebral arteries: Left vertebral artery arises separately from the aortic arch. Right vertebral artery rises from the right subclavian artery. Right vertebral artery slightly dominant. Vertebral arteries patent within the neck without stenosis, dissection, or occlusion. Skeleton: No acute osseus abnormality. No worrisome lytic or blastic osseous lesions. Mild to moderate multilevel degenerative spondylolysis within the cervical spine, greatest at C5-6. Other neck: No acute soft tissue abnormality within the neck. Salivary glands normal. No adenopathy. Thyroid normal. Upper chest: Visualized upper chest within normal limits. Visualized lungs are clear. Review of the MIP images confirms the above findings CTA HEAD FINDINGS Anterior circulation: Petrous, cavernous, and supraclinoid segments patent bilaterally without flow-limiting stenosis. Minimal plaque noted within the cavernous ICAs. ICA termini widely patent. A1 segments patent bilaterally. Anterior communicating artery normal. Anterior cerebral artery is patent to their distal aspects. Right M1 segment patent to its distal aspect. There is occlusion of proximal right M2 branch at the base of the right sylvian fissure (series 12, image 96), likely embolic from thrombus seen in the neck. Right MCA branches demonstrate multifocal irregularity with stenoses distally. Left M1 segment patent to its distal aspect. No proximal left M2 occlusion. Left MCA branches well opacified to their distal aspects. Posterior circulation: Vertebral artery's patent to the vertebrobasilar junction without stenosis. Patent left PICA. Dominant right  AICA. Basilar artery widely patent to its distal aspect. Superior cerebral arteries patent bilaterally. PCA supplied via the basilar as well is prominent bilateral posterior communicating arteries. PCAs patent to their distal aspects. Venous sinuses: Patent. Anatomic variants: No significant anatomic variant. No aneurysm or vascular malformation. Delayed phase: Not performed. Review of the MIP images confirms the above findings CT Brain Perfusion Findings: CBF (<30%) Volume: 0mL Perfusion (Tmax>6.0s) volume: 86mL Mismatch Volume: 86mL Infarction Location:While no core infarct is registered, there is evidence of evolving ischemia within the right insula and frontal operculum on prior noncontrast head CT. This is suspected to be mass by collateralization. Evidence of moderate surrounding penumbra within the right frontal lobe on associated perfusion map. IMPRESSION: 1. 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 Right ICA somewhat diffusely narrow and irregular distally to the skullbase. 2. Positive CTA for emergent large vessel occlusion with right M2 occlusion and evolving right frontal lobe infarct involving the right insula and frontal operculum. Moderate surrounding penumbra as above. 3. Additional mild for age atherosclerotic changes above. No other high-grade or correctable stenosis. Critical Value/emergent results were called by telephone at the time of interpretation on 11/25/2016 at 4:16 pm to Dr. Milon Dikes , who verbally acknowledged these results. Electronically Signed   By: Rise Mu M.D.   On: 11/25/2016 16:53   Ct Head Wo Contrast  Result Date: 11/25/2016 CLINICAL DATA:  Follow-up exam status post catheter directed thrombectomy. EXAM: CT HEAD WITHOUT CONTRAST TECHNIQUE: Contiguous axial images were obtained from the base of the skull through the  vertex without intravenous contrast. COMPARISON:  Prior CTs from earlier the same day. FINDINGS: Brain: Serpiginous hyperdensity seen layering throughout the right sylvian fissure and multiple cortical sulci of the  overlying right frontal lobe, likely reflecting contrast leakage from recent arteriogram. Superimposed hemorrhage not excluded. Evolving hypodensity within the right insula an overlying operculum and right frontal lobe consistent with right MCA territory stroke. Overall, this has slightly evolved from previous. No other acute intracranial hemorrhage. No other acute large vessel territory infarct. No mass lesion or midline shift. No hydrocephalus. No extra-axial fluid collection. Vascular: Diffuse hyperdensity throughout the intracranial vasculature, like related IV contrast. Skull: Scalp soft tissues and calvarium within normal limits. Sinuses/Orbits: Globes and orbital soft tissues within normal limits. Scattered mucosal thickening within the ethmoidal air cells, sphenoid sinuses, and maxillary sinuses. Mastoid air cells remain clear. IMPRESSION: 1. Serpiginous subarachnoid hyperdensity within the right sylvian fissure and overlying right frontal lobe, most consistent with contrast leakage from recent catheter directed arteriogram. Superimposed hemorrhage not entirely excluded. Attention at follow-up recommended. 2. Evolving acute right MCA territory infarct. Electronically Signed   By: Rise Mu M.D.   On: 11/25/2016 20:27   Ct Head Wo Contrast  Result Date: 11/25/2016 CLINICAL DATA:  71 year old who woke up this morning with facial droop, left upper extremity weakness and slurred speech. EXAM: CT HEAD WITHOUT CONTRAST TECHNIQUE: Contiguous axial images were obtained from the base of the skull through the vertex without intravenous contrast. COMPARISON:  None. FINDINGS: Brain: Ventricular system normal in size and appearance for age. Mild changes of small vessel disease of the white  matter. Asymmetric low attenuation involving the white matter of the right superior temporal lobe extending into the right frontal lobe. No associated hemorrhage or hematoma. No extra-axial fluid collections. No visible mass lesion. No midline shift. Mild cerebellar vermian atrophy. No cortical atrophy. Vascular: Hyperdense right middle cerebral artery. Mild bilateral carotid siphon atherosclerosis. Skull: No skull fracture or other focal osseous abnormality involving the skull. Sinuses/Orbits: Visualized paranasal sinuses, bilateral mastoid air cells and bilateral middle ear cavities well-aerated. Visualized orbits and globes are normal. Other: None. IMPRESSION: 1. 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 Hyperdense right middle cerebral artery indicates acute or subacute occlusion. 2. No associated hemorrhage, hematoma, mass effect or midline shift at this time. I personally telephoned these results at the time of interpretation on 11/25/2016 at 1:51 pm to Dr. Rockne Menghini, who verbally acknowledged these results. Electronically Signed   By: Hulan Saas M.D.   On: 11/25/2016 13:53   Ct Angio Neck W Or Wo Contrast  Result Date: 11/25/2016 CLINICAL DATA:  Initial evaluation for acute dysarthria, stroke. EXAM: CT ANGIOGRAPHY HEAD AND NECK CT PERFUSION BRAIN TECHNIQUE: Multidetector CT imaging of the head and neck was performed using the standard protocol during bolus administration of intravenous contrast. Multiplanar CT image reconstructions and MIPs were obtained to evaluate the vascular anatomy. Carotid stenosis measurements (when applicable) are obtained utilizing NASCET criteria, using the distal internal carotid diameter as the denominator. Multiphase CT imaging of the brain was performed following IV bolus contrast injection. Subsequent parametric perfusion maps were calculated using RAPID software. CONTRAST:  100 cc of Isovue 370. COMPARISON:  Prior head CT from earlier  the same day. 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 No evidence for hemorrhage or mass effect. Overall, this is stable to minimally progressed from scan performed earlier the same day and. No other acute large vessel territory infarct. No acute intracranial hemorrhage. No mass lesion, midline shift or mass effect. No hydrocephalus. No extra-axial fluid collection. Vascular:  Focal hyperdensity within distal right M1/proximal right M2 branches, consistent with thrombus. Vascular calcifications noted within the carotid siphons. Skull: Scalp soft tissues and calvarium within normal limits. Sinuses/Orbits: Globes and orbital soft tissues within normal limits. Mild mucosal thickening within the maxillary sinuses. Paranasal sinuses are otherwise clear. No mastoid effusion. ASPECTS [facility] Stroke Program Early CT Score) - Ganglionic level infarction (caudate, lentiform nuclei, internal capsule, insula, M1-M3 cortex): 5 - Supraganglionic infarction (M4-M6 cortex): 2 Total score (0-10 with 10 being normal): 7 Review of the MIP images confirms the above findings CTA NECK FINDINGS Aortic arch: Visualized aortic arch of normal caliber with normal branch pattern. Minimal plaque within the arch itself. No flow-limiting stenosis about the origin of the great vessels. Visualized subclavian arteries widely patent. Right carotid system: Right common carotid artery widely patent from its origin to the bifurcation. A centric calcified plaque about the right bifurcation/proximal right ICA. There is abrupt near occlusion of the proximal right ICA at the bifurcation (series 12, image 202). A focal string sign is present. Intraluminal filling defect just distally consistent with thrombus. Area of involvement begins at the bifurcation and measures approximately 15 mm in length. A focal ruptured plaque  with associated acute dissection is suspected. Distally, the right ICA is somewhat diminutive in caliber and irregular, but is patent to the skullbase without additional focal stenosis or other vascular abnormality. Left carotid system: Left common carotid artery widely patent from its origin to the bifurcation. Minimal plaque about the left bifurcation without stenosis. Left ICA widely patent distally to the skullbase without stenosis, dissection, or occlusion. Vertebral arteries: Left vertebral artery arises separately from the aortic arch. Right vertebral artery rises from the right subclavian artery. Right vertebral artery slightly dominant. Vertebral arteries patent within the neck without stenosis, dissection, or occlusion. Skeleton: No acute osseus abnormality. No worrisome lytic or blastic osseous lesions. Mild to moderate multilevel degenerative spondylolysis within the cervical spine, greatest at C5-6. Other neck: No acute soft tissue abnormality within the neck. Salivary glands normal. No adenopathy. Thyroid normal. Upper chest: Visualized upper chest within normal limits. Visualized lungs are clear. Review of the MIP images confirms the above findings CTA HEAD FINDINGS Anterior circulation: Petrous, cavernous, and supraclinoid segments patent bilaterally without flow-limiting stenosis. Minimal plaque noted within the cavernous ICAs. ICA termini widely patent. A1 segments patent bilaterally. Anterior communicating artery normal. Anterior cerebral artery is patent to their distal aspects. Right M1 segment patent to its distal aspect. There is occlusion of proximal right M2 branch at the base of the right sylvian fissure (series 12, image 96), likely embolic from thrombus seen in the neck. Right MCA branches demonstrate multifocal irregularity with stenoses distally. Left M1 segment patent to its distal aspect. No proximal left M2 occlusion. Left MCA branches well opacified to their distal aspects.  Posterior circulation: Vertebral artery's patent to the vertebrobasilar junction without stenosis. Patent left PICA. Dominant right AICA. Basilar artery widely patent to its distal aspect. Superior cerebral arteries patent bilaterally. PCA supplied via the basilar as well is prominent bilateral posterior communicating arteries. PCAs patent to their distal aspects. Venous sinuses: Patent. Anatomic variants: No significant anatomic variant. No aneurysm or vascular malformation. Delayed phase: Not performed. Review of the MIP images confirms the above findings CT Brain Perfusion Findings: CBF (<30%) Volume: 0mL Perfusion (Tmax>6.0s) volume: 86mL Mismatch Volume: 86mL Infarction Location:While no core infarct is registered, there is evidence of evolving ischemia within the right insula and frontal operculum on prior noncontrast head CT. This  is suspected to be mass by collateralization. Evidence of moderate surrounding penumbra within the right frontal lobe on associated perfusion map. IMPRESSION: 1. 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 Right ICA somewhat diffusely narrow and irregular distally to the skullbase. 2. Positive CTA for emergent large vessel occlusion with right M2 occlusion and evolving right frontal lobe infarct involving the right insula and frontal operculum. Moderate surrounding penumbra as above. 3. Additional mild for age atherosclerotic changes above. No other high-grade or correctable stenosis. Critical Value/emergent results were called by telephone at the time of interpretation on 11/25/2016 at 4:16 pm to Dr. Milon Dikes , who verbally acknowledged these results. Electronically Signed   By: Rise Mu M.D.   On: 11/25/2016 16:53   Mr Brain Wo Contrast  Result Date: 11/26/2016 CLINICAL DATA:  71 y/o M; clot retrieval and carotid stent  yesterday, evaluate extent of stroke. EXAM: MRI HEAD WITHOUT CONTRAST TECHNIQUE: Multiplanar, multiecho pulse sequences of the brain and surrounding structures were obtained without intravenous contrast. COMPARISON:  11/25/2016 CT of the head and CT angiogram of the head. FINDINGS: Brain: Region of reduced diffusion compatible with acute infarction measures 3.9 x 4.1 x 4.7 cm (volume = 39 cm^3) (AP x ML x CC, series 300, image 33 and series 400, image 17). The infarct is centered in the right frontoparietal junction and insula. There are a few additional punctate scattered foci throughout the right MCA distribution, within the right occipital lobe, and within the left superior cerebellar hemisphere. Areas of infarction demonstrate associated T2 hyperintense signal abnormality and minimal local mass effect. Incomplete FLAIR suppression of sulci with minimal SWI hypointensity within the right sylvian fissure and overlying the right posterior frontal lobe infarction which may represent a combination of extravasated contrast and/or hemorrhage as seen on prior CT of the head. The distribution is stable from prior CT. No extra-axial collection, hydrocephalus, or herniation. Vascular: Normal central flow voids. Skull and upper cervical spine: Nonspecific low signal focus within the right parietal bone near the vertex measuring 20 mm (series 9, image 91) with sclerosis on CT, probably a bone island. Sinuses/Orbits: Moderate diffuse paranasal sinus mucosal thickening with small fluid levels in the sphenoid sinuses. Trace mastoid effusions. Paranasal sinus disease and mastoid effusions are probably due to intubation. Other: None. IMPRESSION: 1. Right insula and frontal parietal junction acute infarction with volume of 39 cc. Several additional punctate foci of acute infarction are present scattered throughout the right frontal lobe, right parietal lobe, right occipital lobe, and left superior cerebellar hemisphere. 2. Stable  right sylvian and posterior frontal convexity extra-axial signal abnormality from extravasated contrast and/or subarachnoid hemorrhage in comparison with prior CT given differences in technique. Electronically Signed   By: Mitzi Hansen M.D.   On: 11/26/2016 22:29   Ir Delsa Sale Stent Cerv Carotid W/o Emb-prot Mod Sed  Result Date: 11/27/2016 INDICATION: Left sided weaknee and severe dysarthria EXAM: 1. EMERGENT LARGE VESSEL OCCLUSION THROMBOLYSIS (anterior CIRCULATION) 2. 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 COMPARISON:  CT angiogram of 11/25/2016, and CT perfusion scan of 11/25/2016. MEDICATIONS: Ancef 2 g IV antibiotic was administered within 1 hour of the procedure. ANESTHESIA/SEDATION: General anesthesia. CONTRAST:  Isovue 300 approximately 120 CC. FLUOROSCOPY TIME:  Fluoroscopy Time: 51 minutes 48 seconds (2968 mGy). COMPLICATIONS: None immediate. TECHNIQUE: Following a full explanation of the procedure along with the  potential associated complications, an informed witnessed consent was obtained from the patient and the patient's wide. The risks of intracranial hemorrhage of 10%, worsening neurological deficit, ventilator dependency, death and inability to revascularize were all reviewed in detail with the patient'swife. The patient was then put under general anesthesia by the Department of Anesthesiology at [facility]. The right groin was prepped and draped in the usual sterile fashion. Thereafter using modified Seldinger technique, transfemoral access into the right common femoral artery was obtained without difficulty. Over a 0.035 inch guidewire a 5 French Pinnacle sheath was inserted. Through this, and also over a 0.035 inch guidewire a 5 [place] JB 1 catheter was advanced to the aortic arch region and selectively positioned in the right subclavian artery and the left common carotid artery, the left vertebral  artery and the right common carotid artery. FINDINGS: The right subclavian arteriogram demonstrates the origin the right vertebral artery to be normal. The vessel is seen to opacify to the cranial skull base and opacifies the right vertebrobasilar junction and right posterior-inferior cerebellar artery. The opacified portion of the basilar artery and the right posterior cerebral artery appears grossly intact on the lateral images. The right common carotid arteriogram demonstrates extensive plaque extending from the distal right common carotid artery anterior wall advancing into the proximal right external carotid artery. The external carotid artery origin is mildly narrowed. Its branches are normally opacified. The right internal carotid artery just proximal to the bulb demonstrates a severe string-like pre occlusive stenosis. There is poststenotic dilatation of the bulb itself. The delayed images demonstrate slow ascent of contrast to the cranial skull base in the right internal carotid artery. The petrous, cavernous and supraclinoid segments demonstrate wide patency. Opacification of the right middle and right anterior cerebral artery is noted with poor visualization of the superior division of the right middle cerebral artery. Non-opacified blood is seen in the right middle cerebral artery proximally and the right anterior cerebral artery A1 segment probably from the posterior circulation via the posterior communicating artery. The left vertebral artery origin is from the aortic arch between the origins of the left common carotid artery and the left subclavian arteries. The vessel is seen to opacify to the cranial skull base. Wide patency is seen of the left vertebrobasilar junction and the left posterior-inferior cerebellar artery. The basilar artery, the superior cerebellar arteries and the anterior-inferior cerebellar arteries are seen to opacify into the capillary and venous phases. Opacification of the right  posterior cerebellar P1 segment is noted. There is prompt opacification via the right posterior communicating artery of the right middle cerebral artery distribution right M1 segment and transiently the right anterior cerebral artery A1 segment. The delayed arterial phase again demonstrates a large area of hypoperfusion to absent perfusion involving the entire perisylvian triangle. There is poor attempt at retrograde opacification of the anterior 2/3 of the perisylvian branches from the pericallosal and callosal marginal branches. The inferior division appears to be widely patent with opacification to the cortical subcortical areas. The left common carotid arteriogram demonstrates the left external carotid artery and its branches to be widely patent. The left internal carotid artery at the bulb to the cranial skull base opacifies normally. The petrous, the cavernous and the supraclinoid segments demonstrate wide patency as well. A left posterior communicating artery is seen opacifying the left posterior cerebral artery distribution and transiently the left superior cerebellar artery distribution. The left middle cerebral artery and the left anterior cerebral artery opacify normally into the  capillary and venous phases. Transient cross opacification via the anterior communicating artery of the right anterior cerebral artery A2 segment and distally is noted. PROCEDURE: ENDOVASCULAR REVASCULARIZATION OF PRE OCCLUSIVE RIGHT INTERNAL CAROTID ARTERY AT THE BULB, WITH REVASCULARIZATION OF OCCLUDED SUPERIOR DIVISION OF THE RIGHT MIDDLE CEREBRAL ARTERY WITH MECHANICAL THROMBECTOMY USING 1 PASS WITH THE SOLITAIRE FR 4 MM X 40 MM RETRIEVAL DEVICE The diagnostic JB 1 catheter in the right common carotid artery was exchanged over a 0.035 inch 300 cm Rosen exchange guidewire for an 8 French 55 cm Brite tip neurovascular sheath using biplane roadmap technique and constant fluoroscopic guidance. Good aspiration was obtained from  the side port of the neurovascular sheath. This was then connected to continuous heparinized saline infusion. Over the [employer] guidewire, an 8 [place] 85 cm FlowGate balloon guide catheter which had been prepped with 50% contrast and 50% heparinized saline infusion was advanced and positioned just proximal to the right common carotid bifurcation. The guidewire was removed. Good aspiration obtained from the hub of the 8 [place] FlowGate guide catheter. A gentle contrast injection demonstrated no evidence of spasms, dissections or intraluminal filling defects. A rapid transit 2 tip microcatheter was then advanced over a 0.0141 Softtip Synchro micro guidewire to the distal end of the [facility] guide catheter. Again using biplane roadmap technique and constant fluoroscopic guidance, in a coaxial manner and with constant heparinized saline infusion, the combination of the micro guidewire and the microcatheter advanced into the origin of the right internal carotid artery. The micro guidewire with the J-tip configuration to avoid dissections or inducing spasm was then manipulated using a torque device passed the string sign stenoses without difficulty and advanced in combination with the microcatheter to the horizontal petrous segment of the right internal carotid artery. The guidewire was removed. Good aspiration obtained from the hub of the rapid transit microcatheter. This was then exchanged out for a 300 cm 0.014 inch Softtip Transend exchange micro guidewire using biplane roadmap technique and constant fluoroscopic guidance. The exchange wire tip had a J configuration to avoid dissections or inducing spasm. A control arteriogram performed through the [facility] guide catheter in the right common carotid artery demonstrated no change in the high-grade stenosis with flow noted distally into the right internal carotid artery skull base. At this time, a 4 mm x 30 mm Viatrac 14 angioplasty balloon was prepped and purged  retrogradely with heparinized saline infusion. Using the rapid exchange technique, this was then advanced and positioned such that the distal and the proximal markers were adequate distant from the site of the severe focal stenosis. A slow control inflation was then performed using a micro inflation syringe device via micro tubing to 10 atmospheres achieving a diameter of 4.08 mm where it was maintained for approximately 20 seconds. The balloon was then gently deflated and retrieved proximally and removed. A control arteriogram performed through the 8 [place] FlowGate guide catheter demonstrated significantly improved caliber and flow through the internal carotid artery and distally. 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 Over the exchange Transend EX micro guidewire, a Trevo ProVue 021 microcatheter was advanced and positioned in the proximal cavernous segment. The guidewire was removed. Good aspiration obtained from the hub of the microcatheter. Through this, a 0.014 inch Softip Synchro micro guidewire with a J tip configuration was advanced with the microcatheter to the supraclinoid right ICA. Using a torque device the micro guidewire  was then gently advanced into the origin of the superior division. The micro micro guidewire was then gently advanced without difficulty into the M2 M3 region of the superior division followed by the microcatheter. At this time the [facility] guide catheter was advanced into the origin of the right internal carotid artery. Good aspiration obtained from the hub of the microcatheter. A gentle contrast injection demonstrated safe position of the tip of the microcatheter with slow antegrade clearance of contrast. A 4 mm x 40 mm Solitaire FR retrieval device was then advanced in a coaxial manner and with constant heparinized saline infusion using biplane roadmap  technique and constant fluoroscopic guidance to the distal end of the microcatheter. The O ring on the delivery microcatheter was then gently loosened. There was slight forward gentle traction with the right hand on the delivery micro guidewire, with the left hand the delivery microcatheter was retrieved with the tip just proximal to the proximal portion of the opened retrieval device. A control arteriogram performed through the 8 [place] FlowGate guide catheter demonstrated only partial contrast advancement through the stented segment of the superior division. The balloon was then inflated in the origin of the right internal carotid artery for proximal flow arrest. The proximal portion of the retrieval device was then captured into the microcatheter. Thereafter using constant aspiration with a 60 mL syringe at the hub of the [facility] guide catheter, the combination of the microcatheter and the retrieval device was then gently retrieved and removed. Chunks of clots were noted trapped within the interstices of the retrieval device and also in the hub of the [employer] [place]. Aspiration was continued as the proximal flow arrest was released. Free back bleed of blood was noted at the hub of the 8 [place] FlowGate guide catheter. A control arteriogram performed through the 8 [place] FlowGate guide catheter demonstrated antegrade flow to the right internal carotid supraclinoid segments. Free flow was now noted through the superior division and inferior division of the right middle cerebral artery. The right anterior cerebral artery remained patent. A TICI 2b reperfusion had been established. There was no extravasation of contrast or of mass-effect on the major vessels intracranially. The rapid transit 2 tip microcatheter was then again advanced to the distal end of the 8 [place] FlowGate guide catheter just proximal to the origin of the right internal carotid artery with a 0.014 inch Softip Synchro micro guidewire. The micro  guidewire was then gently manipulated through the now angioplastied segment of the right internal carotid artery followed by the microcatheter. The combination was advanced to the horizontal petrous segment. The guidewire was removed. Good aspiration obtained from the hub of the microcatheter. This in turn was then exchanged for a 300 cm Transend Softip EX exchange micro guidewire under constant fluoroscopic guidance. The exchange guidewire had a J-tip configuration in the petrous segment. A control arteriogram performed through the 8 [place] FlowGate guide catheter continued to demonstrate excellent flow through the right internal carotid artery intracranially and proximally performed of the right common carotid artery and right internal carotid artery in their normal segments. It was elected to use a 6 mm - 8 mm x 40 mm Xact stent system. The stent carrying catheter was retrogradely flushed with heparinized saline infusion. Again using the rapid exchange technique, this was advanced under constant fluoroscopic guidance and positioned with the distal and the proximal markers of the stent optimally in relation to the angioplastied segment. This stent was then deployed under constant fluoroscopic guidance without any difficulty.  The delivery catheter was then retrieved and removed with the exchange guidewire being maintained in the petrous segment. A control arteriogram performed through 8 [place] FlowGate guide catheter demonstrated excellent apposition proximally and distally with excellent flow noted through the stented segment. A 5 mm x 30 mm Viatrac 14 angioplasty balloon was then prepped and purged with heparinized saline infusion. Again using the rapid exchange technique this was advanced with the distal and proximal markers positioned optimally in relation to the waist on the stented segment. A slow control inflation was then performed of the angioplasty catheter to 10 atmospheres achieving a diameter of 5.14  mm. This was maintained for approximately 20 seconds. The balloon was then gently deflated and retrieved and removed. A control arteriogram performed through the 8 [place] FlowGate guide catheter demonstrated excellent flow and apposition. No evidence of dissection was noted distal to the angioplastied segment of the right internal carotid artery. There was now free flow noted into the right middle cerebral artery and right anterior cerebral artery. The superior division remained widely open with a small focal area of hypoperfusion involving the posterior perisylvian branches. However, delayed retrograde opacification via the inferior division collaterals were noted. Mild caliber irregularity within the stented segment was noted on the 10 minute control arteriogram. This prompted the use of 1.8 mg of selective intra-arterial Integrilin into this stent. Control arteriograms were then performed at 20, 30 and 40 minutes post second angioplasty. These continued to demonstrate excellent flow through the stented segment of the right internal carotid artery and intracranially. The exchange micro guidewire was then gently retrieved and removed after the 20 minute control arteriogram. The 8 French FlowGate guide catheter and the 8 [place] Brite tip neurovascular sheath were then retrieved into the abdominal aorta and exchanged over a J-tip guidewire for a 9 [place] Pinnacle sheath. This was then connected to continuous heparinized saline infusion. The right groin appeared soft without evidence of a hematoma or bleeding. The distal pulses in both feet remained 1+ after the procedure unchanged compared to prior to the procedure. The patient was also loaded with 650 mg of aspirin and 300 mg of Plavix via an orogastric tube prior to performing the right internal carotid artery angioplasty. Also the patient was given approximately 2000 units of IV heparin at the beginning of the procedure. Patient was then transported to the CT  scanner for postprocedural CT scan of the brain. 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 1.5 mg of a selective intra-arterial Integrilin utilized within the segment of the right internal carotid artery. PLAN: Postprocedural CT scan of the brain prior to being transferred to the neuro ICU. Electronically Signed   By: Julieanne Cotton M.D.   On: 11/26/2016 21:18   Ct Cerebral Perfusion W Contrast  Result Date: 11/25/2016 CLINICAL DATA:  Initial evaluation for acute dysarthria, stroke. EXAM: CT ANGIOGRAPHY HEAD AND NECK CT PERFUSION BRAIN TECHNIQUE: Multidetector CT imaging of the head and neck was performed using the standard protocol during bolus administration of intravenous contrast. Multiplanar CT image reconstructions and MIPs were obtained to evaluate the vascular anatomy. Carotid stenosis measurements (when applicable) are obtained utilizing NASCET criteria, using the distal internal carotid diameter as the denominator. Multiphase  CT imaging of the brain was performed following IV bolus contrast injection. Subsequent parametric perfusion maps were calculated using RAPID software. CONTRAST:  100 cc of Isovue 370. COMPARISON:  Prior head CT from earlier the same day. 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 No evidence for hemorrhage or mass effect. Overall, this is stable to minimally progressed from scan performed earlier the same day and. No other acute large vessel territory infarct. No acute intracranial hemorrhage.  No mass lesion, midline shift or mass effect. No hydrocephalus. No extra-axial fluid collection. Vascular: Focal hyperdensity within distal right M1/proximal right M2 branches, consistent with thrombus. Vascular calcifications noted within the carotid siphons. Skull: Scalp soft tissues and calvarium within normal limits. Sinuses/Orbits: Globes and orbital soft tissues within normal limits. Mild mucosal thickening within the maxillary sinuses. Paranasal sinuses are otherwise clear. No mastoid effusion. ASPECTS [facility] Stroke Program Early CT Score) - Ganglionic level infarction (caudate, lentiform nuclei, internal capsule, insula, M1-M3 cortex): 5 - Supraganglionic infarction (M4-M6 cortex): 2 Total score (0-10 with 10 being normal): 7 Review of the MIP images confirms the above findings CTA NECK FINDINGS Aortic arch: Visualized aortic arch of normal caliber with normal branch pattern. Minimal plaque within the arch itself. No flow-limiting stenosis about the origin of the great vessels. Visualized subclavian arteries widely patent. Right carotid system: Right common carotid artery widely patent from its origin to the bifurcation. A centric calcified plaque about the right bifurcation/proximal right ICA. There is abrupt near occlusion of the proximal right ICA at the bifurcation (series 12, image 202). A focal string sign is present. Intraluminal filling defect just distally consistent with thrombus. Area of involvement begins at the bifurcation and measures approximately 15 mm in length. A focal ruptured plaque with associated acute dissection is suspected. Distally, the right ICA is somewhat diminutive in caliber and irregular, but is patent to the skullbase without additional focal stenosis or other vascular abnormality. Left carotid system: Left common carotid artery widely patent from its origin to the bifurcation. Minimal plaque about the left bifurcation without stenosis. Left ICA widely patent distally to  the skullbase without stenosis, dissection, or occlusion. Vertebral arteries: Left vertebral artery arises separately from the aortic arch. Right vertebral artery rises from the right subclavian artery. Right vertebral artery slightly dominant. Vertebral arteries patent within the neck without stenosis, dissection, or occlusion. Skeleton: No acute osseus abnormality. No worrisome lytic or blastic osseous lesions. Mild to moderate multilevel degenerative spondylolysis within the cervical spine, greatest at C5-6. Other neck: No acute soft tissue abnormality within the neck. Salivary glands normal. No adenopathy. Thyroid normal. Upper chest: Visualized upper chest within normal limits. Visualized lungs are clear. Review of the MIP images confirms the above findings CTA HEAD FINDINGS Anterior circulation: Petrous, cavernous, and supraclinoid segments patent bilaterally without flow-limiting stenosis. Minimal plaque noted within the cavernous ICAs. ICA termini widely patent. A1 segments patent bilaterally. Anterior communicating artery normal. Anterior cerebral artery is patent to their distal aspects. Right M1 segment patent to its distal aspect. There is occlusion of proximal right M2 branch at the base of the right sylvian fissure (series 12, image 96), likely embolic from thrombus seen in the neck. Right MCA branches demonstrate multifocal irregularity with stenoses distally. Left M1 segment patent to its distal aspect. No proximal left M2 occlusion. Left MCA branches well opacified to their distal aspects. Posterior circulation: Vertebral artery's patent  to the vertebrobasilar junction without stenosis. Patent left PICA. Dominant right AICA. Basilar artery widely patent to its distal aspect. Superior cerebral arteries patent bilaterally. PCA supplied via the basilar as well is prominent bilateral posterior communicating arteries. PCAs patent to their distal aspects. Venous sinuses: Patent. Anatomic variants: No  significant anatomic variant. No aneurysm or vascular malformation. Delayed phase: Not performed. Review of the MIP images confirms the above findings CT Brain Perfusion Findings: CBF (<30%) Volume: 0mL Perfusion (Tmax>6.0s) volume: 86mL Mismatch Volume: 86mL Infarction Location:While no core infarct is registered, there is evidence of evolving ischemia within the right insula and frontal operculum on prior noncontrast head CT. This is suspected to be mass by collateralization. Evidence of moderate surrounding penumbra within the right frontal lobe on associated perfusion map. IMPRESSION: 1. 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 Right ICA somewhat diffusely narrow and irregular distally to the skullbase. 2. Positive CTA for emergent large vessel occlusion with right M2 occlusion and evolving right frontal lobe infarct involving the right insula and frontal operculum. Moderate surrounding penumbra as above. 3. Additional mild for age atherosclerotic changes above. No other high-grade or correctable stenosis. Critical Value/emergent results were called by telephone at the time of interpretation on 11/25/2016 at 4:16 pm to Dr. Milon Dikes , who verbally acknowledged these results. Electronically Signed   By: Rise Mu M.D.   On: 11/25/2016 16:53   Dg Chest Port 1 View  Result Date: 11/27/2016 CLINICAL DATA:  Acute CVA, intubated patient EXAM: PORTABLE CHEST 1 VIEW COMPARISON:  Portable chest x-ray of November 25, 2016 FINDINGS: The lungs are adequately inflated. There is subsegmental atelectasis in the perihilar regions greatest on the left. There is no alveolar infiltrate. There is no pleural effusion or pneumothorax. The heart and pulmonary vascularity are normal. The endotracheal tube tip lies approximately 4.8 cm above the carina. The esophagogastric tube tip  and proximal port project below the inferior margin of the image. IMPRESSION: Perihilar subsegmental atelectasis. No pneumonia nor pulmonary edema. The support tubes are in reasonable position. Electronically Signed   By: David  [place] M.D.   On: 11/27/2016 07:22   Portable Chest Xray  Result Date: 11/25/2016 CLINICAL DATA:  Intubation EXAM: PORTABLE CHEST 1 VIEW COMPARISON:  None. FINDINGS: Endotracheal tube tip is about 4.3 cm superior to the carina. Esophageal tube tip is in the left upper quadrant. Low lung volumes. Atelectasis or mild infiltrate at the left lung base. Normal heart size. Mediastinal with upper normal, likely augmented by low lung volume. No pneumothorax. IMPRESSION: 1. Endotracheal tube tip about 4.3 cm superior to carina 2. Patchy atelectasis or infiltrate at the left lung base 3. Upper normal mediastinal with, likely augmented by low lung volume. Electronically Signed   By: Jasmine Pang M.D.   On: 11/25/2016 20:40   Dg Abd Portable 1v  Result Date: 11/27/2016 CLINICAL DATA:  Feeding tube placement EXAM: PORTABLE ABDOMEN - 1 VIEW COMPARISON:  None. FINDINGS: Feeding tube tip is in the distal stomach. Nonobstructive bowel gas pattern with mild gaseous distention of bowel diffusely. IMPRESSION: Feeding tube tip in the distal stomach. Electronically Signed   By: Charlett Nose M.D.   On: 11/27/2016 14:39   Ir Percutaneous Art Thrombectomy/infusion Intracranial Inc Diag Angio  Result Date: 11/27/2016 INDICATION: Left sided weaknee and severe dysarthria EXAM: 1. EMERGENT LARGE VESSEL OCCLUSION THROMBOLYSIS (anterior CIRCULATION) 2. Revascularization of symptomatic severe  pre occlusive stenosis of the right internal carotid artery proximally. COMPARISON:  CT angiogram of 11/25/2016, and CT perfusion scan of 11/25/2016. MEDICATIONS: Ancef 2 g IV antibiotic was administered within 1 hour of the procedure. ANESTHESIA/SEDATION: General anesthesia. CONTRAST:  Isovue 300 approximately 120 CC.  FLUOROSCOPY TIME:  Fluoroscopy Time: 51 minutes 48 seconds (2968 mGy). COMPLICATIONS: None immediate. TECHNIQUE: Following a full explanation of the procedure along with the potential associated complications, an informed witnessed consent was obtained from the patient and the patient's wide. The risks of intracranial hemorrhage of 10%, worsening neurological deficit, ventilator dependency, death and inability to revascularize were all reviewed in detail with the patient'swife. The patient was then put under general anesthesia by the Department of Anesthesiology at [facility]. The right groin was prepped and draped in the usual sterile fashion. Thereafter using modified Seldinger technique, transfemoral access into the right common femoral artery was obtained without difficulty. Over a 0.035 inch guidewire a 5 French Pinnacle sheath was inserted. Through this, and also over a 0.035 inch guidewire a 5 [place] JB 1 catheter was advanced to the aortic arch region and selectively positioned in the right subclavian artery and the left common carotid artery, the left vertebral artery and the right common carotid artery. FINDINGS: The right subclavian arteriogram demonstrates the origin the right vertebral artery to be normal. The vessel is seen to opacify to the cranial skull base and opacifies the right vertebrobasilar junction and right posterior-inferior cerebellar artery. The opacified portion of the basilar artery and the right posterior cerebral artery appears grossly intact on the lateral images. The right common carotid arteriogram demonstrates extensive plaque extending from the distal right common carotid artery anterior wall advancing into the proximal right external carotid artery. The external carotid artery origin is mildly narrowed. Its branches are normally opacified. The right internal carotid artery just proximal to the bulb demonstrates a severe string-like pre occlusive stenosis. There is  poststenotic dilatation of the bulb itself. The delayed images demonstrate slow ascent of contrast to the cranial skull base in the right internal carotid artery. The petrous, cavernous and supraclinoid segments demonstrate wide patency. Opacification of the right middle and right anterior cerebral artery is noted with poor visualization of the superior division of the right middle cerebral artery. Non-opacified blood is seen in the right middle cerebral artery proximally and the right anterior cerebral artery A1 segment probably from the posterior circulation via the posterior communicating artery. The left vertebral artery origin is from the aortic arch between the origins of the left common carotid artery and the left subclavian arteries. The vessel is seen to opacify to the cranial skull base. Wide patency is seen of the left vertebrobasilar junction and the left posterior-inferior cerebellar artery. The basilar artery, the superior cerebellar arteries and the anterior-inferior cerebellar arteries are seen to opacify into the capillary and venous phases. Opacification of the right posterior cerebellar P1 segment is noted. There is prompt opacification via the right posterior communicating artery of the right middle cerebral artery distribution right M1 segment and transiently the right anterior cerebral artery A1 segment. The delayed arterial phase again demonstrates a large area of hypoperfusion to absent perfusion involving the entire perisylvian triangle. There is poor attempt at retrograde opacification of the anterior 2/3 of the perisylvian branches from the pericallosal and callosal marginal branches. The inferior division appears to be widely patent with opacification to the cortical subcortical areas. The left common carotid arteriogram demonstrates the left external carotid artery and  its branches to be widely patent. The left internal carotid artery at the bulb to the cranial skull base opacifies  normally. The petrous, the cavernous and the supraclinoid segments demonstrate wide patency as well. A left posterior communicating artery is seen opacifying the left posterior cerebral artery distribution and transiently the left superior cerebellar artery distribution. The left middle cerebral artery and the left anterior cerebral artery opacify normally into the capillary and venous phases. Transient cross opacification via the anterior communicating artery of the right anterior cerebral artery A2 segment and distally is noted. PROCEDURE: ENDOVASCULAR REVASCULARIZATION OF PRE OCCLUSIVE RIGHT INTERNAL CAROTID ARTERY AT THE BULB, WITH REVASCULARIZATION OF OCCLUDED SUPERIOR DIVISION OF THE RIGHT MIDDLE CEREBRAL ARTERY WITH MECHANICAL THROMBECTOMY USING 1 PASS WITH THE SOLITAIRE FR 4 MM X 40 MM RETRIEVAL DEVICE The diagnostic JB 1 catheter in the right common carotid artery was exchanged over a 0.035 inch 300 cm Rosen exchange guidewire for an 8 French 55 cm Brite tip neurovascular sheath using biplane roadmap technique and constant fluoroscopic guidance. Good aspiration was obtained from the side port of the neurovascular sheath. This was then connected to continuous heparinized saline infusion. Over the [employer] guidewire, an 8 [place] 85 cm FlowGate balloon guide catheter which had been prepped with 50% contrast and 50% heparinized saline infusion was advanced and positioned just proximal to the right common carotid bifurcation. The guidewire was removed. Good aspiration obtained from the hub of the 8 [place] FlowGate guide catheter. A gentle contrast injection demonstrated no evidence of spasms, dissections or intraluminal filling defects. A rapid transit 2 tip microcatheter was then advanced over a 0.0141 Softtip Synchro micro guidewire to the distal end of the [facility] guide catheter. Again using biplane roadmap technique and constant fluoroscopic guidance, in a coaxial manner and with constant heparinized  saline infusion, the combination of the micro guidewire and the microcatheter advanced into the origin of the right internal carotid artery. The micro guidewire with the J-tip configuration to avoid dissections or inducing spasm was then manipulated using a torque device passed the string sign stenoses without difficulty and advanced in combination with the microcatheter to the horizontal petrous segment of the right internal carotid artery. The guidewire was removed. Good aspiration obtained from the hub of the rapid transit microcatheter. This was then exchanged out for a 300 cm 0.014 inch Softtip Transend exchange micro guidewire using biplane roadmap technique and constant fluoroscopic guidance. The exchange wire tip had a J configuration to avoid dissections or inducing spasm. A control arteriogram performed through the [facility] guide catheter in the right common carotid artery demonstrated no change in the high-grade stenosis with flow noted distally into the right internal carotid artery skull base. At this time, a 4 mm x 30 mm Viatrac 14 angioplasty balloon was prepped and purged retrogradely with heparinized saline infusion. Using the rapid exchange technique, this was then advanced and positioned such that the distal and the proximal markers were adequate distant from the site of the severe focal stenosis. A slow control inflation was then performed using a micro inflation syringe device via micro tubing to 10 atmospheres achieving a diameter of 4.08 mm where it was maintained for approximately 20 seconds. The balloon was then gently deflated and retrieved proximally and removed. A control arteriogram performed through the 8 [place] FlowGate guide catheter demonstrated significantly improved caliber and flow through the internal carotid artery and distally. A control arteriogram was then performed centered intracranially which confirmed the presence of occluded superior division  of the right middle cerebral  artery. Over the exchange Transend EX micro guidewire, a Trevo ProVue 021 microcatheter was advanced and positioned in the proximal cavernous segment. The guidewire was removed. Good aspiration obtained from the hub of the microcatheter. Through this, a 0.014 inch Softip Synchro micro guidewire with a J tip configuration was advanced with the microcatheter to the supraclinoid right ICA. Using a torque device the micro guidewire was then gently advanced into the origin of the superior division. The micro micro guidewire was then gently advanced without difficulty into the M2 M3 region of the superior division followed by the microcatheter. At this time the [facility] guide catheter was advanced into the origin of the right internal carotid artery. Good aspiration obtained from the hub of the microcatheter. A gentle contrast injection demonstrated safe position of the tip of the microcatheter with slow antegrade clearance of contrast. A 4 mm x 40 mm Solitaire FR retrieval device was then advanced in a coaxial manner and with constant heparinized saline infusion using biplane roadmap technique and constant fluoroscopic guidance to the distal end of the microcatheter. The O ring on the delivery microcatheter was then gently loosened. There was slight forward gentle traction with the right hand on the delivery micro guidewire, with the left hand the delivery microcatheter was retrieved with the tip just proximal to the proximal portion of the opened retrieval device. A control arteriogram performed through the 8 [place] FlowGate guide catheter demonstrated only partial contrast advancement through the stented segment of the superior division. The balloon was then inflated in the origin of the right internal carotid artery for proximal flow arrest. The proximal portion of the retrieval device was then captured into the microcatheter. Thereafter using constant aspiration with a 60 mL syringe at the hub of the [facility] guide  catheter, the combination of the microcatheter and the retrieval device was then gently retrieved and removed. Chunks of clots were noted trapped within the interstices of the retrieval device and also in the hub of the [employer] [place]. Aspiration was continued as the proximal flow arrest was released. Free back bleed of blood was noted at the hub of the 8 [place] FlowGate guide catheter. A control arteriogram performed through the 8 [place] FlowGate guide catheter demonstrated antegrade flow to the right internal carotid supraclinoid segments. Free flow was now noted through the superior division and inferior division of the right middle cerebral artery. The right anterior cerebral artery remained patent. A TICI 2b reperfusion had been established. There was no extravasation of contrast or of mass-effect on the major vessels intracranially. The rapid transit 2 tip microcatheter was then again advanced to the distal end of the 8 [place] FlowGate guide catheter just proximal to the origin of the right internal carotid artery with a 0.014 inch Softip Synchro micro guidewire. The micro guidewire was then gently manipulated through the now angioplastied segment of the right internal carotid artery followed by the microcatheter. The combination was advanced to the horizontal petrous segment. The guidewire was removed. Good aspiration obtained from the hub of the microcatheter. This in turn was then exchanged for a 300 cm Transend Softip EX exchange micro guidewire under constant fluoroscopic guidance. The exchange guidewire had a J-tip configuration in the petrous segment. A control arteriogram performed through the 8 [place] FlowGate guide catheter continued to demonstrate excellent flow through the right internal carotid artery intracranially and proximally performed of the right common carotid artery and right internal carotid artery in their normal segments. It  was elected to use a 6 mm - 8 mm x 40 mm Xact stent system. The  stent carrying catheter was retrogradely flushed with heparinized saline infusion. Again using the rapid exchange technique, this was advanced under constant fluoroscopic guidance and positioned with the distal and the proximal markers of the stent optimally in relation to the angioplastied segment. This stent was then deployed under constant fluoroscopic guidance without any difficulty. The delivery catheter was then retrieved and removed with the exchange guidewire being maintained in the petrous segment. A control arteriogram performed through 8 [place] FlowGate guide catheter demonstrated excellent apposition proximally and distally with excellent flow noted through the stented segment. A 5 mm x 30 mm Viatrac 14 angioplasty balloon was then prepped and purged with heparinized saline infusion. Again using the rapid exchange technique this was advanced with the distal and proximal markers positioned optimally in relation to the waist on the stented segment. A slow control inflation was then performed of the angioplasty catheter to 10 atmospheres achieving a diameter of 5.14 mm. This was maintained for approximately 20 seconds. The balloon was then gently deflated and retrieved and removed. A control arteriogram performed through the 8 [place] FlowGate guide catheter demonstrated excellent flow and apposition. No evidence of dissection was noted distal to the angioplastied segment of the right internal carotid artery. There was now free flow noted into the right middle cerebral artery and right anterior cerebral artery. The superior division remained widely open with a small focal area of hypoperfusion involving the posterior perisylvian branches. However, delayed retrograde opacification via the inferior division collaterals were noted. Mild caliber irregularity within the stented segment was noted on the 10 minute control arteriogram. This prompted the use of 1.8 mg of selective intra-arterial Integrilin into this  stent. Control arteriograms were then performed at 20, 30 and 40 minutes post second angioplasty. These continued to demonstrate excellent flow through the stented segment of the right internal carotid artery and intracranially. The exchange micro guidewire was then gently retrieved and removed after the 20 minute control arteriogram. The 8 [place] FlowGate guide catheter and the 8 [place] Brite tip neurovascular sheath were then retrieved into the abdominal aorta and exchanged over a J-tip guidewire for a 9 [place] Pinnacle sheath. This was then connected to continuous heparinized saline infusion. The right groin appeared soft without evidence of a hematoma or bleeding. The distal pulses in both feet remained 1+ after the procedure unchanged compared to prior to the procedure. The patient was also loaded with 650 mg of aspirin and 300 mg of Plavix via an orogastric tube prior to performing the right internal carotid artery angioplasty. Also the patient was given approximately 2000 units of IV heparin at the beginning of the procedure. Patient was then transported to the CT scanner for postprocedural CT scan of the brain. 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 1.5 mg of a selective intra-arterial Integrilin utilized within the segment of the right internal carotid artery. PLAN: Postprocedural CT scan of the brain prior to being transferred to the neuro ICU. Electronically Signed   By: Julieanne Cotton M.D.   On: 11/26/2016 21:18   Ir Angio Intra Extracran Sel Com Carotid Innominate Uni L  Mod Sed  Result Date: 11/27/2016 INDICATION: Left sided weaknee and  severe dysarthria EXAM: 1. EMERGENT LARGE VESSEL OCCLUSION THROMBOLYSIS (anterior CIRCULATION) 2. 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 COMPARISON:  CT angiogram of 11/25/2016, and CT perfusion scan of 11/25/2016. MEDICATIONS: Ancef 2 g IV antibiotic was administered within 1 hour of the procedure. ANESTHESIA/SEDATION: General anesthesia. CONTRAST:  Isovue 300 approximately 120 CC. FLUOROSCOPY TIME:  Fluoroscopy Time: 51 minutes 48 seconds (2968 mGy). COMPLICATIONS: None immediate. TECHNIQUE: Following a full explanation of the procedure along with the potential associated complications, an informed witnessed consent was obtained from the patient and the patient's wide. The risks of intracranial hemorrhage of 10%, worsening neurological deficit, ventilator dependency, death and inability to revascularize were all reviewed in detail with the patient'swife. The patient was then put under general anesthesia by the Department of Anesthesiology at [facility]. The right groin was prepped and draped in the usual sterile fashion. Thereafter using modified Seldinger technique, transfemoral access into the right common femoral artery was obtained without difficulty. Over a 0.035 inch guidewire a 5 French Pinnacle sheath was inserted. Through this, and also over a 0.035 inch guidewire a 5 [place] JB 1 catheter was advanced to the aortic arch region and selectively positioned in the right subclavian artery and the left common carotid artery, the left vertebral artery and the right common carotid artery. FINDINGS: The right subclavian arteriogram demonstrates the origin the right vertebral artery to be normal. The vessel is seen to opacify to the cranial skull base and opacifies the right vertebrobasilar junction and right posterior-inferior cerebellar artery. The opacified portion of the basilar artery and the right posterior cerebral artery  appears grossly intact on the lateral images. The right common carotid arteriogram demonstrates extensive plaque extending from the distal right common carotid artery anterior wall advancing into the proximal right external carotid artery. The external carotid artery origin is mildly narrowed. Its branches are normally opacified. The right internal carotid artery just proximal to the bulb demonstrates a severe string-like pre occlusive stenosis. There is poststenotic dilatation of the bulb itself. The delayed images demonstrate slow ascent of contrast to the cranial skull base in the right internal carotid artery. The petrous, cavernous and supraclinoid segments demonstrate wide patency. Opacification of the right middle and right anterior cerebral artery is noted with poor visualization of the superior division of the right middle cerebral artery. Non-opacified blood is seen in the right middle cerebral artery proximally and the right anterior cerebral artery A1 segment probably from the posterior circulation via the posterior communicating artery. The left vertebral artery origin is from the aortic arch between the origins of the left common carotid artery and the left subclavian arteries. The vessel is seen to opacify to the cranial skull base. Wide patency is seen of the left vertebrobasilar junction and the left posterior-inferior cerebellar artery. The basilar artery, the superior cerebellar arteries and the anterior-inferior cerebellar arteries are seen to opacify into the capillary and venous phases. Opacification of the right posterior cerebellar P1 segment is noted. There is prompt opacification via the right posterior communicating artery of the right middle cerebral artery distribution right M1 segment and transiently the right anterior cerebral artery A1 segment. The delayed arterial phase again demonstrates a large area of hypoperfusion to absent perfusion involving the entire perisylvian triangle.  There is poor attempt at retrograde opacification of the anterior 2/3 of the perisylvian branches from the pericallosal and callosal marginal  branches. The inferior division appears to be widely patent with opacification to the cortical subcortical areas. The left common carotid arteriogram demonstrates the left external carotid artery and its branches to be widely patent. The left internal carotid artery at the bulb to the cranial skull base opacifies normally. The petrous, the cavernous and the supraclinoid segments demonstrate wide patency as well. A left posterior communicating artery is seen opacifying the left posterior cerebral artery distribution and transiently the left superior cerebellar artery distribution. The left middle cerebral artery and the left anterior cerebral artery opacify normally into the capillary and venous phases. Transient cross opacification via the anterior communicating artery of the right anterior cerebral artery A2 segment and distally is noted. PROCEDURE: ENDOVASCULAR REVASCULARIZATION OF PRE OCCLUSIVE RIGHT INTERNAL CAROTID ARTERY AT THE BULB, WITH REVASCULARIZATION OF OCCLUDED SUPERIOR DIVISION OF THE RIGHT MIDDLE CEREBRAL ARTERY WITH MECHANICAL THROMBECTOMY USING 1 PASS WITH THE SOLITAIRE FR 4 MM X 40 MM RETRIEVAL DEVICE The diagnostic JB 1 catheter in the right common carotid artery was exchanged over a 0.035 inch 300 cm Rosen exchange guidewire for an 8 [place] 55 cm Brite tip neurovascular sheath using biplane roadmap technique and constant fluoroscopic guidance. Good aspiration was obtained from the side port of the neurovascular sheath. This was then connected to continuous heparinized saline infusion. Over the [employer] guidewire, an 8 [place] 85 cm FlowGate balloon guide catheter which had been prepped with 50% contrast and 50% heparinized saline infusion was advanced and positioned just proximal to the right common carotid bifurcation. The guidewire was removed. Good  aspiration obtained from the hub of the 8 [place] FlowGate guide catheter. A gentle contrast injection demonstrated no evidence of spasms, dissections or intraluminal filling defects. A rapid transit 2 tip microcatheter was then advanced over a 0.0141 Softtip Synchro micro guidewire to the distal end of the [facility] guide catheter. Again using biplane roadmap technique and constant fluoroscopic guidance, in a coaxial manner and with constant heparinized saline infusion, the combination of the micro guidewire and the microcatheter advanced into the origin of the right internal carotid artery. The micro guidewire with the J-tip configuration to avoid dissections or inducing spasm was then manipulated using a torque device passed the string sign stenoses without difficulty and advanced in combination with the microcatheter to the horizontal petrous segment of the right internal carotid artery. The guidewire was removed. Good aspiration obtained from the hub of the rapid transit microcatheter. This was then exchanged out for a 300 cm 0.014 inch Softtip Transend exchange micro guidewire using biplane roadmap technique and constant fluoroscopic guidance. The exchange wire tip had a J configuration to avoid dissections or inducing spasm. A control arteriogram performed through the [facility] guide catheter in the right common carotid artery demonstrated no change in the high-grade stenosis with flow noted distally into the right internal carotid artery skull base. At this time, a 4 mm x 30 mm Viatrac 14 angioplasty balloon was prepped and purged retrogradely with heparinized saline infusion. Using the rapid exchange technique, this was then advanced and positioned such that the distal and the proximal markers were adequate distant from the site of the severe focal stenosis. A slow control inflation was then performed using a micro inflation syringe device via micro tubing to 10 atmospheres achieving a diameter of 4.08 mm  where it was maintained for approximately 20 seconds. The balloon was then gently deflated and retrieved proximally and removed. A control arteriogram performed through the 8 [place] FlowGate guide catheter demonstrated  significantly improved caliber and flow through the internal carotid artery and distally. 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 Over the exchange Transend EX micro guidewire, a Trevo ProVue 021 microcatheter was advanced and positioned in the proximal cavernous segment. The guidewire was removed. Good aspiration obtained from the hub of the microcatheter. Through this, a 0.014 inch Softip Synchro micro guidewire with a J tip configuration was advanced with the microcatheter to the supraclinoid right ICA. Using a torque device the micro guidewire was then gently advanced into the origin of the superior division. The micro micro guidewire was then gently advanced without difficulty into the M2 M3 region of the superior division followed by the microcatheter. At this time the [facility] guide catheter was advanced into the origin of the right internal carotid artery. Good aspiration obtained from the hub of the microcatheter. A gentle contrast injection demonstrated safe position of the tip of the microcatheter with slow antegrade clearance of contrast. A 4 mm x 40 mm Solitaire FR retrieval device was then advanced in a coaxial manner and with constant heparinized saline infusion using biplane roadmap technique and constant fluoroscopic guidance to the distal end of the microcatheter. The O ring on the delivery microcatheter was then gently loosened. There was slight forward gentle traction with the right hand on the delivery micro guidewire, with the left hand the delivery microcatheter was retrieved with the tip just proximal to the proximal portion of the opened retrieval device. A  control arteriogram performed through the 8 [place] FlowGate guide catheter demonstrated only partial contrast advancement through the stented segment of the superior division. The balloon was then inflated in the origin of the right internal carotid artery for proximal flow arrest. The proximal portion of the retrieval device was then captured into the microcatheter. Thereafter using constant aspiration with a 60 mL syringe at the hub of the [facility] guide catheter, the combination of the microcatheter and the retrieval device was then gently retrieved and removed. Chunks of clots were noted trapped within the interstices of the retrieval device and also in the hub of the [employer] [place]. Aspiration was continued as the proximal flow arrest was released. Free back bleed of blood was noted at the hub of the 8 [place] FlowGate guide catheter. A control arteriogram performed through the 8 [place] FlowGate guide catheter demonstrated antegrade flow to the right internal carotid supraclinoid segments. Free flow was now noted through the superior division and inferior division of the right middle cerebral artery. The right anterior cerebral artery remained patent. A TICI 2b reperfusion had been established. There was no extravasation of contrast or of mass-effect on the major vessels intracranially. The rapid transit 2 tip microcatheter was then again advanced to the distal end of the 8 [place] FlowGate guide catheter just proximal to the origin of the right internal carotid artery with a 0.014 inch Softip Synchro micro guidewire. The micro guidewire was then gently manipulated through the now angioplastied segment of the right internal carotid artery followed by the microcatheter. The combination was advanced to the horizontal petrous segment. The guidewire was removed. Good aspiration obtained from the hub of the microcatheter. This in turn was then exchanged for a 300 cm Transend Softip EX exchange micro guidewire under  constant fluoroscopic guidance. The exchange guidewire had a J-tip configuration in the petrous segment. A control arteriogram performed through the 8 [place] FlowGate guide catheter continued to demonstrate  excellent flow through the right internal carotid artery intracranially and proximally performed of the right common carotid artery and right internal carotid artery in their normal segments. It was elected to use a 6 mm - 8 mm x 40 mm Xact stent system. The stent carrying catheter was retrogradely flushed with heparinized saline infusion. Again using the rapid exchange technique, this was advanced under constant fluoroscopic guidance and positioned with the distal and the proximal markers of the stent optimally in relation to the angioplastied segment. This stent was then deployed under constant fluoroscopic guidance without any difficulty. The delivery catheter was then retrieved and removed with the exchange guidewire being maintained in the petrous segment. A control arteriogram performed through 8 [place] FlowGate guide catheter demonstrated excellent apposition proximally and distally with excellent flow noted through the stented segment. A 5 mm x 30 mm Viatrac 14 angioplasty balloon was then prepped and purged with heparinized saline infusion. Again using the rapid exchange technique this was advanced with the distal and proximal markers positioned optimally in relation to the waist on the stented segment. A slow control inflation was then performed of the angioplasty catheter to 10 atmospheres achieving a diameter of 5.14 mm. This was maintained for approximately 20 seconds. The balloon was then gently deflated and retrieved and removed. A control arteriogram performed through the 8 [place] FlowGate guide catheter demonstrated excellent flow and apposition. No evidence of dissection was noted distal to the angioplastied segment of the right internal carotid artery. There was now free flow noted into the  right middle cerebral artery and right anterior cerebral artery. The superior division remained widely open with a small focal area of hypoperfusion involving the posterior perisylvian branches. However, delayed retrograde opacification via the inferior division collaterals were noted. Mild caliber irregularity within the stented segment was noted on the 10 minute control arteriogram. This prompted the use of 1.8 mg of selective intra-arterial Integrilin into this stent. Control arteriograms were then performed at 20, 30 and 40 minutes post second angioplasty. These continued to demonstrate excellent flow through the stented segment of the right internal carotid artery and intracranially. The exchange micro guidewire was then gently retrieved and removed after the 20 minute control arteriogram. The 8 [place] FlowGate guide catheter and the 8 [place] Brite tip neurovascular sheath were then retrieved into the abdominal aorta and exchanged over a J-tip guidewire for a 9 [place] Pinnacle sheath. This was then connected to continuous heparinized saline infusion. The right groin appeared soft without evidence of a hematoma or bleeding. The distal pulses in both feet remained 1+ after the procedure unchanged compared to prior to the procedure. The patient was also loaded with 650 mg of aspirin and 300 mg of Plavix via an orogastric tube prior to performing the right internal carotid artery angioplasty. Also the patient was given approximately 2000 units of IV heparin at the beginning of the procedure. Patient was then transported to the CT scanner for postprocedural CT scan of the brain. 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 1.5 mg of a selective intra-arterial Integrilin utilized within the segment of the right internal carotid artery. PLAN: Postprocedural CT scan of the brain prior to being transferred to the  neuro ICU. Electronically Signed   By: Julieanne Cotton M.D.   On: 11/26/2016 21:18   Ir Angio Vertebral Sel Subclavian Innominate Uni R Mod Sed  Result Date: 11/27/2016 INDICATION: Left sided weaknee and severe dysarthria EXAM: 1. EMERGENT LARGE VESSEL OCCLUSION THROMBOLYSIS (anterior CIRCULATION) 2. 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 COMPARISON:  CT angiogram of 11/25/2016, and CT perfusion scan of 11/25/2016. MEDICATIONS: Ancef 2 g IV antibiotic was administered within 1 hour of the procedure. ANESTHESIA/SEDATION: General anesthesia. CONTRAST:  Isovue 300 approximately 120 CC. FLUOROSCOPY TIME:  Fluoroscopy Time: 51 minutes 48 seconds (2968 mGy). COMPLICATIONS: None immediate. TECHNIQUE: Following a full explanation of the procedure along with the potential associated complications, an informed witnessed consent was obtained from the patient and the patient's wide. The risks of intracranial hemorrhage of 10%, worsening neurological deficit, ventilator dependency, death and inability to revascularize were all reviewed in detail with the patient'swife. The patient was then put under general anesthesia by the Department of Anesthesiology at [facility]. The right groin was prepped and draped in the usual sterile fashion. Thereafter using modified Seldinger technique, transfemoral access into the right common femoral artery was obtained without difficulty. Over a 0.035 inch guidewire a 5 French Pinnacle sheath was inserted. Through this, and also over a 0.035 inch guidewire a 5 [place] JB 1 catheter was advanced to the aortic arch region and selectively positioned in the right  subclavian artery and the left common carotid artery, the left vertebral artery and the right common carotid artery. FINDINGS: The right subclavian arteriogram demonstrates the origin the right vertebral artery to be normal. The vessel is seen to opacify to the cranial skull base and opacifies the right vertebrobasilar junction and right posterior-inferior cerebellar artery. The opacified portion of the basilar artery and the right posterior cerebral artery appears grossly intact on the lateral images. The right common carotid arteriogram demonstrates extensive plaque extending from the distal right common carotid artery anterior wall advancing into the proximal right external carotid artery. The external carotid artery origin is mildly narrowed. Its branches are normally opacified. The right internal carotid artery just proximal to the bulb demonstrates a severe string-like pre occlusive stenosis. There is poststenotic dilatation of the bulb itself. The delayed images demonstrate slow ascent of contrast to the cranial skull base in the right internal carotid artery. The petrous, cavernous and supraclinoid segments demonstrate wide patency. Opacification of the right middle and right anterior cerebral artery is noted with poor visualization of the superior division of the right middle cerebral artery. Non-opacified blood is seen in the right middle cerebral artery proximally and the right anterior cerebral artery A1 segment probably from the posterior circulation via the posterior communicating artery. The left vertebral artery origin is from the aortic arch between the origins of the left common carotid artery and the left subclavian arteries. The vessel is seen to opacify to the cranial skull base. Wide patency is seen of the left vertebrobasilar junction and the left posterior-inferior cerebellar artery. The basilar artery, the superior cerebellar arteries and the anterior-inferior cerebellar arteries are seen to  opacify into the capillary and venous phases. Opacification of the right posterior cerebellar P1 segment is noted. There is prompt opacification via the right posterior communicating artery of the right middle cerebral artery distribution right M1 segment and transiently the right anterior cerebral artery A1 segment. The delayed arterial phase again demonstrates a large area of hypoperfusion to absent perfusion involving  the entire perisylvian triangle. There is poor attempt at retrograde opacification of the anterior 2/3 of the perisylvian branches from the pericallosal and callosal marginal branches. The inferior division appears to be widely patent with opacification to the cortical subcortical areas. The left common carotid arteriogram demonstrates the left external carotid artery and its branches to be widely patent. The left internal carotid artery at the bulb to the cranial skull base opacifies normally. The petrous, the cavernous and the supraclinoid segments demonstrate wide patency as well. A left posterior communicating artery is seen opacifying the left posterior cerebral artery distribution and transiently the left superior cerebellar artery distribution. The left middle cerebral artery and the left anterior cerebral artery opacify normally into the capillary and venous phases. Transient cross opacification via the anterior communicating artery of the right anterior cerebral artery A2 segment and distally is noted. PROCEDURE: ENDOVASCULAR REVASCULARIZATION OF PRE OCCLUSIVE RIGHT INTERNAL CAROTID ARTERY AT THE BULB, WITH REVASCULARIZATION OF OCCLUDED SUPERIOR DIVISION OF THE RIGHT MIDDLE CEREBRAL ARTERY WITH MECHANICAL THROMBECTOMY USING 1 PASS WITH THE SOLITAIRE FR 4 MM X 40 MM RETRIEVAL DEVICE The diagnostic JB 1 catheter in the right common carotid artery was exchanged over a 0.035 inch 300 cm Rosen exchange guidewire for an 8 French 55 cm Brite tip neurovascular sheath using biplane roadmap technique  and constant fluoroscopic guidance. Good aspiration was obtained from the side port of the neurovascular sheath. This was then connected to continuous heparinized saline infusion. Over the [employer] exchange guidewire, an 8 [place] 85 cm FlowGate balloon guide catheter which had been prepped with 50% contrast and 50% heparinized saline infusion was advanced and positioned just proximal to the right common carotid bifurcation. The guidewire was removed. Good aspiration obtained from the hub of the 8 [place] FlowGate guide catheter. A gentle contrast injection demonstrated no evidence of spasms, dissections or intraluminal filling defects. A rapid transit 2 tip microcatheter was then advanced over a 0.0141 Softtip Synchro micro guidewire to the distal end [facility] the [facility]. Again using biplane roadmap technique and constant fluoroscopic guidance, in a coaxial manner and with constant heparinized saline infusion, the combination of the micro guidewire and the microcatheter advanced into the origin of the right internal carotid artery. The micro guidewire with the J-tip configuration to avoid dissections or inducing spasm was then manipulated using a torque device passed the string sign stenoses without difficulty and advanced in combination with the microcatheter to the horizontal petrous segment of the right internal carotid artery. The guidewire was removed. Good aspiration obtained from the hub of the rapid transit microcatheter. This was then exchanged out for a 300 cm 0.014 inch Softtip Transend exchange micro guidewire using biplane roadmap technique and constant fluoroscopic guidance. The exchange wire tip had a J configuration to avoid dissections or inducing spasm. A control arteriogram performed through the [facility] in the right common carotid artery demonstrated no change in the high-grade stenosis with flow noted distally into the right internal carotid artery skull base. At this time,  a 4 mm x 30 mm Viatrac 14 angioplasty balloon was prepped and purged retrogradely with heparinized saline infusion. Using the rapid exchange technique, this was then advanced and positioned such that the distal and the proximal markers were adequate distant from the site of the severe focal stenosis. A slow control inflation was then performed using a micro inflation syringe device via micro tubing to 10 atmospheres achieving a diameter of 4.08 mm where it was maintained for approximately  20 seconds. The balloon was then gently deflated and retrieved proximally and removed. A control arteriogram performed through the 8 [place] FlowGate guide catheter demonstrated significantly improved caliber and flow through the internal carotid artery and distally. 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 Over the exchange Transend EX micro guidewire, a Trevo ProVue 021 microcatheter was advanced and positioned in the proximal cavernous segment. The guidewire was removed. Good aspiration obtained from the hub of the microcatheter. Through this, a 0.014 inch Softip Synchro micro guidewire with a J tip configuration was advanced with the microcatheter to the supraclinoid right ICA. Using a torque device the micro guidewire was then gently advanced into the origin of the superior division. The micro micro guidewire was then gently advanced without difficulty into the M2 M3 region of the superior division followed by the microcatheter. At this time the [facility] guide catheter was advanced into the origin of the right internal carotid artery. Good aspiration obtained from the hub of the microcatheter. A gentle contrast injection demonstrated safe position of the tip of the microcatheter with slow antegrade clearance of contrast. A 4 mm x 40 mm Solitaire FR retrieval device was then advanced in a coaxial manner and  with constant heparinized saline infusion using biplane roadmap technique and constant fluoroscopic guidance to the distal end of the microcatheter. The O ring on the delivery microcatheter was then gently loosened. There was slight forward gentle traction with the right hand on the delivery micro guidewire, with the left hand the delivery microcatheter was retrieved with the tip just proximal to the proximal portion of the opened retrieval device. A control arteriogram performed through the 8 [place] FlowGate guide catheter demonstrated only partial contrast advancement through the stented segment of the superior division. The balloon was then inflated in the origin of the right internal carotid artery for proximal flow arrest. The proximal portion of the retrieval device was then captured into the microcatheter. Thereafter using constant aspiration with a 60 mL syringe at the hub of the [facility] guide catheter, the combination of the microcatheter and the retrieval device was then gently retrieved and removed. Chunks of clots were noted trapped within the interstices of the retrieval device and also in the hub of the [employer] [place]. Aspiration was continued as the proximal flow arrest was released. Free back bleed of blood was noted at the hub of the 8 [place] FlowGate guide catheter. A control arteriogram performed through the 8 [place] FlowGate guide catheter demonstrated antegrade flow to the right internal carotid supraclinoid segments. Free flow was now noted through the superior division and inferior division of the right middle cerebral artery. The right anterior cerebral artery remained patent. A TICI 2b reperfusion had been established. There was no extravasation of contrast or of mass-effect on the major vessels intracranially. The rapid transit 2 tip microcatheter was then again advanced to the distal end of the 8 [place] FlowGate guide catheter just proximal to the origin of the right internal carotid artery  with a 0.014 inch Softip Synchro micro guidewire. The micro guidewire was then gently manipulated through the now angioplastied segment of the right internal carotid artery followed by the microcatheter. The combination was advanced to the horizontal petrous segment. The guidewire was removed. Good aspiration obtained from the hub of the microcatheter. This in turn was then exchanged for a 300 cm Transend Softip EX exchange micro guidewire under constant fluoroscopic guidance.  The exchange guidewire had a J-tip configuration in the petrous segment. A control arteriogram performed through the 8 [place] FlowGate guide catheter continued to demonstrate excellent flow through the right internal carotid artery intracranially and proximally performed of the right common carotid artery and right internal carotid artery in their normal segments. It was elected to use a 6 mm - 8 mm x 40 mm Xact stent system. The stent carrying catheter was retrogradely flushed with heparinized saline infusion. Again using the rapid exchange technique, this was advanced under constant fluoroscopic guidance and positioned with the distal and the proximal markers of the stent optimally in relation to the angioplastied segment. This stent was then deployed under constant fluoroscopic guidance without any difficulty. The delivery catheter was then retrieved and removed with the exchange guidewire being maintained in the petrous segment. A control arteriogram performed through 8 [place] FlowGate guide catheter demonstrated excellent apposition proximally and distally with excellent flow noted through the stented segment. A 5 mm x 30 mm Viatrac 14 angioplasty balloon was then prepped and purged with heparinized saline infusion. Again using the rapid exchange technique this was advanced with the distal and proximal markers positioned optimally in relation to the waist on the stented segment. A slow control inflation was then performed of the  angioplasty catheter to 10 atmospheres achieving a diameter of 5.14 mm. This was maintained for approximately 20 seconds. The balloon was then gently deflated and retrieved and removed. A control arteriogram performed through the 8 [place] FlowGate guide catheter demonstrated excellent flow and apposition. No evidence of dissection was noted distal to the angioplastied segment of the right internal carotid artery. There was now free flow noted into the right middle cerebral artery and right anterior cerebral artery. The superior division remained widely open with a small focal area of hypoperfusion involving the posterior perisylvian branches. However, delayed retrograde opacification via the inferior division collaterals were noted. Mild caliber irregularity within the stented segment was noted on the 10 minute control arteriogram. This prompted the use of 1.8 mg of selective intra-arterial Integrilin into this stent. Control arteriograms were then performed at 20, 30 and 40 minutes post second angioplasty. These continued to demonstrate excellent flow through the stented segment of the right internal carotid artery and intracranially. The exchange micro guidewire was then gently retrieved and removed after the 20 minute control arteriogram. The 8 [place] FlowGate guide catheter and the 8 [place] Brite tip neurovascular sheath were then retrieved into the abdominal aorta and exchanged over a J-tip guidewire for a 9 [place] Pinnacle sheath. This was then connected to continuous heparinized saline infusion. The right groin appeared soft without evidence of a hematoma or bleeding. The distal pulses in both feet remained 1+ after the procedure unchanged compared to prior to the procedure. The patient was also loaded with 650 mg of aspirin and 300 mg of Plavix via an orogastric tube prior to performing the right internal carotid artery angioplasty. Also the patient was given approximately 2000 units of IV heparin at the  beginning of the procedure. Patient was then transported to the CT scanner for postprocedural CT scan of the brain. 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 1.5 mg of a selective  intra-arterial Integrilin utilized within the segment of the right internal carotid artery. PLAN: Postprocedural CT scan of the brain prior to being transferred to the neuro ICU. Electronically Signed   By: Julieanne Cotton M.D.   On: 11/26/2016 21:18   Ir Angio Vertebral Sel Vertebral Uni L Mod Sed  Result Date: 11/27/2016 INDICATION: Left sided weaknee and severe dysarthria EXAM: 1. EMERGENT LARGE VESSEL OCCLUSION THROMBOLYSIS (anterior CIRCULATION) 2. 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 COMPARISON:  CT angiogram of 11/25/2016, and CT perfusion scan of 11/25/2016. MEDICATIONS: Ancef 2 g IV antibiotic was administered within 1 hour of the procedure. ANESTHESIA/SEDATION: General anesthesia. CONTRAST:  Isovue 300 approximately 120 CC. FLUOROSCOPY TIME:  Fluoroscopy Time: 51 minutes 48 seconds (2968 mGy). COMPLICATIONS: None immediate. TECHNIQUE: Following a full explanation of the procedure along with the potential associated complications, an informed witnessed consent was obtained from the patient and the patient's wide. The risks of intracranial hemorrhage of 10%, worsening neurological deficit, ventilator dependency, death and inability to revascularize were all reviewed in detail with the patient'swife. The patient was then put under general anesthesia by the Department of Anesthesiology at [facility]. The right groin was prepped and draped in the usual sterile fashion. Thereafter using modified Seldinger technique, transfemoral access into the right common femoral artery was obtained without difficulty. Over a 0.035 inch guidewire a 5 French Pinnacle sheath was inserted. Through this, and also over a 0.035 inch guidewire a 5 [place] JB 1 catheter was advanced to the aortic arch region and selectively positioned in the right subclavian artery and the left common carotid artery, the left vertebral artery and the right common carotid artery. FINDINGS: The right subclavian arteriogram demonstrates the origin the right vertebral artery to be normal. The vessel is seen to opacify to the cranial skull base and opacifies the right vertebrobasilar junction and right posterior-inferior cerebellar artery. The opacified portion of the basilar artery and the right posterior cerebral artery appears grossly intact on the lateral images. The right common carotid arteriogram demonstrates extensive plaque extending from the distal right common carotid artery anterior wall advancing into the proximal right external carotid artery. The external carotid artery origin is mildly narrowed. Its branches are normally opacified. The right internal carotid artery just proximal to the bulb demonstrates a severe string-like pre occlusive stenosis. There is poststenotic dilatation of the bulb itself. The delayed images demonstrate slow ascent of contrast to the cranial skull base in the right internal carotid artery. The petrous, cavernous and supraclinoid segments demonstrate wide patency. Opacification of the right middle and right anterior cerebral artery is noted with poor visualization of the superior division of the right middle cerebral artery. Non-opacified blood is seen in the right middle cerebral artery proximally and the right anterior cerebral artery A1 segment probably from the posterior circulation via the posterior  communicating artery. The left vertebral artery origin is from the aortic arch between the origins of the left common carotid artery and the left subclavian arteries. The vessel is seen to opacify to the cranial skull base. Wide patency is seen of the left vertebrobasilar junction and the left posterior-inferior cerebellar artery. The basilar artery, the superior cerebellar arteries and the anterior-inferior cerebellar arteries are seen to opacify into the capillary and venous phases. Opacification of the right posterior cerebellar P1 segment is noted. There is prompt opacification via the right posterior communicating artery of the right middle cerebral artery distribution right M1 segment  and transiently the right anterior cerebral artery A1 segment. The delayed arterial phase again demonstrates a large area of hypoperfusion to absent perfusion involving the entire perisylvian triangle. There is poor attempt at retrograde opacification of the anterior 2/3 of the perisylvian branches from the pericallosal and callosal marginal branches. The inferior division appears to be widely patent with opacification to the cortical subcortical areas. The left common carotid arteriogram demonstrates the left external carotid artery and its branches to be widely patent. The left internal carotid artery at the bulb to the cranial skull base opacifies normally. The petrous, the cavernous and the supraclinoid segments demonstrate wide patency as well. A left posterior communicating artery is seen opacifying the left posterior cerebral artery distribution and transiently the left superior cerebellar artery distribution. The left middle cerebral artery and the left anterior cerebral artery opacify normally into the capillary and venous phases. Transient cross opacification via the anterior communicating artery of the right anterior cerebral artery A2 segment and distally is noted. PROCEDURE: ENDOVASCULAR REVASCULARIZATION OF PRE  OCCLUSIVE RIGHT INTERNAL CAROTID ARTERY AT THE BULB, WITH REVASCULARIZATION OF OCCLUDED SUPERIOR DIVISION OF THE RIGHT MIDDLE CEREBRAL ARTERY WITH MECHANICAL THROMBECTOMY USING 1 PASS WITH THE SOLITAIRE FR 4 MM X 40 MM RETRIEVAL DEVICE The diagnostic JB 1 catheter in the right common carotid artery was exchanged over a 0.035 inch 300 cm Rosen exchange guidewire for an 8 French 55 cm Brite tip neurovascular sheath using biplane roadmap technique and constant fluoroscopic guidance. Good aspiration was obtained from the side port of the neurovascular sheath. This was then connected to continuous heparinized saline infusion. Over the [employer] guidewire, an 8 [place] 85 cm FlowGate balloon guide catheter which had been prepped with 50% contrast and 50% heparinized saline infusion was advanced and positioned just proximal to the right common carotid bifurcation. The guidewire was removed. Good aspiration obtained from the hub of the 8 [place] FlowGate guide catheter. A gentle contrast injection demonstrated no evidence of spasms, dissections or intraluminal filling defects. A rapid transit 2 tip microcatheter was then advanced over a 0.0141 Softtip Synchro micro guidewire to the distal end of the [facility] guide catheter. Again using biplane roadmap technique and constant fluoroscopic guidance, in a coaxial manner and with constant heparinized saline infusion, the combination of the micro guidewire and the microcatheter advanced into the origin of the right internal carotid artery. The micro guidewire with the J-tip configuration to avoid dissections or inducing spasm was then manipulated using a torque device passed the string sign stenoses without difficulty and advanced in combination with the microcatheter to the horizontal petrous segment of the right internal carotid artery. The guidewire was removed. Good aspiration obtained from the hub of the rapid transit microcatheter. This was then exchanged out for a 300  cm 0.014 inch Softtip Transend exchange micro guidewire using biplane roadmap technique and constant fluoroscopic guidance. The exchange wire tip had a J configuration to avoid dissections or inducing spasm. A control arteriogram performed through the [facility] guide catheter in the right common carotid artery demonstrated no change in the high-grade stenosis with flow noted distally into the right internal carotid artery skull base. At this time, a 4 mm x 30 mm Viatrac 14 angioplasty balloon was prepped and purged retrogradely with heparinized saline infusion. Using the rapid exchange technique, this was then advanced and positioned such that the distal and the proximal markers were adequate distant from the site of the severe focal stenosis. A slow control inflation was then performed  using a micro inflation syringe device via micro tubing to 10 atmospheres achieving a diameter of 4.08 mm where it was maintained for approximately 20 seconds. The balloon was then gently deflated and retrieved proximally and removed. A control arteriogram performed through the 8 [place] FlowGate guide catheter demonstrated significantly improved caliber and flow through the internal carotid artery and distally. 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 Over the exchange Transend EX micro guidewire, a Trevo ProVue 021 microcatheter was advanced and positioned in the proximal cavernous segment. The guidewire was removed. Good aspiration obtained from the hub of the microcatheter. Through this, a 0.014 inch Softip Synchro micro guidewire with a J tip configuration was advanced with the microcatheter to the supraclinoid right ICA. Using a torque device the micro guidewire was then gently advanced into the origin of the superior division. The micro micro guidewire was then gently advanced without difficulty into the M2 M3  region of the superior division followed by the microcatheter. At this time the [facility] guide catheter was advanced into the origin of the right internal carotid artery. Good aspiration obtained from the hub of the microcatheter. A gentle contrast injection demonstrated safe position of the tip of the microcatheter with slow antegrade clearance of contrast. A 4 mm x 40 mm Solitaire FR retrieval device was then advanced in a coaxial manner and with constant heparinized saline infusion using biplane roadmap technique and constant fluoroscopic guidance to the distal end of the microcatheter. The O ring on the delivery microcatheter was then gently loosened. There was slight forward gentle traction with the right hand on the delivery micro guidewire, with the left hand the delivery microcatheter was retrieved with the tip just proximal to the proximal portion of the opened retrieval device. A control arteriogram performed through the 8 [place] FlowGate guide catheter demonstrated only partial contrast advancement through the stented segment of the superior division. The balloon was then inflated in the origin of the right internal carotid artery for proximal flow arrest. The proximal portion of the retrieval device was then captured into the microcatheter. Thereafter using constant aspiration with a 60 mL syringe at the hub of the [facility] guide catheter, the combination of the microcatheter and the retrieval device was then gently retrieved and removed. Chunks of clots were noted trapped within the interstices of the retrieval device and also in the hub of the [employer] [place]. Aspiration was continued as the proximal flow arrest was released. Free back bleed of blood was noted at the hub of the 8 [place] FlowGate guide catheter. A control arteriogram performed through the 8 [place] FlowGate guide catheter demonstrated antegrade flow to the right internal carotid supraclinoid segments. Free flow was now noted through the  superior division and inferior division of the right middle cerebral artery. The right anterior cerebral artery remained patent. A TICI 2b reperfusion had been established. There was no extravasation of contrast or of mass-effect on the major vessels intracranially. The rapid transit 2 tip microcatheter was then again advanced to the distal end of the 8 [place] FlowGate guide catheter just proximal to the origin of the right internal carotid artery with a 0.014 inch Softip Synchro micro guidewire. The micro guidewire was then gently manipulated through the now angioplastied segment of the right internal carotid artery followed by the microcatheter. The combination was advanced to the horizontal petrous segment. The guidewire was removed. Good aspiration obtained from the  hub of the microcatheter. This in turn was then exchanged for a 300 cm Transend Softip EX exchange micro guidewire under constant fluoroscopic guidance. The exchange guidewire had a J-tip configuration in the petrous segment. A control arteriogram performed through the 8 [place] FlowGate guide catheter continued to demonstrate excellent flow through the right internal carotid artery intracranially and proximally performed of the right common carotid artery and right internal carotid artery in their normal segments. It was elected to use a 6 mm - 8 mm x 40 mm Xact stent system. The stent carrying catheter was retrogradely flushed with heparinized saline infusion. Again using the rapid exchange technique, this was advanced under constant fluoroscopic guidance and positioned with the distal and the proximal markers of the stent optimally in relation to the angioplastied segment. This stent was then deployed under constant fluoroscopic guidance without any difficulty. The delivery catheter was then retrieved and removed with the exchange guidewire being maintained in the petrous segment. A control arteriogram performed through 8 [place] FlowGate guide  catheter demonstrated excellent apposition proximally and distally with excellent flow noted through the stented segment. A 5 mm x 30 mm Viatrac 14 angioplasty balloon was then prepped and purged with heparinized saline infusion. Again using the rapid exchange technique this was advanced with the distal and proximal markers positioned optimally in relation to the waist on the stented segment. A slow control inflation was then performed of the angioplasty catheter to 10 atmospheres achieving a diameter of 5.14 mm. This was maintained for approximately 20 seconds. The balloon was then gently deflated and retrieved and removed. A control arteriogram performed through the 8 [place] FlowGate guide catheter demonstrated excellent flow and apposition. No evidence of dissection was noted distal to the angioplastied segment of the right internal carotid artery. There was now free flow noted into the right middle cerebral artery and right anterior cerebral artery. The superior division remained widely open with a small focal area of hypoperfusion involving the posterior perisylvian branches. However, delayed retrograde opacification via the inferior division collaterals were noted. Mild caliber irregularity within the stented segment was noted on the 10 minute control arteriogram. This prompted the use of 1.8 mg of selective intra-arterial Integrilin into this stent. Control arteriograms were then performed at 20, 30 and 40 minutes post second angioplasty. These continued to demonstrate excellent flow through the stented segment of the right internal carotid artery and intracranially. The exchange micro guidewire was then gently retrieved and removed after the 20 minute control arteriogram. The 8 French FlowGate guide catheter and the 8 [place] Brite tip neurovascular sheath were then retrieved into the abdominal aorta and exchanged over a J-tip guidewire for a 9 [place] Pinnacle sheath. This was then connected to continuous  heparinized saline infusion. The right groin appeared soft without evidence of a hematoma or bleeding. The distal pulses in both feet remained 1+ after the procedure unchanged compared to prior to the procedure. The patient was also loaded with 650 mg of aspirin and 300 mg of Plavix via an orogastric tube prior to performing the right internal carotid artery angioplasty. Also the patient was given approximately 2000 units of IV heparin at the beginning of the procedure. Patient was then transported to the CT scanner for postprocedural CT scan of the brain. 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 Post endovascular revascularization of  symptomatic pre occlusive string-like stenoses of the right internal carotid artery proximal to the bulb with stent assisted angioplasty. 1.5 mg of a selective intra-arterial Integrilin utilized within the segment of the right internal carotid artery. PLAN: Postprocedural CT scan of the brain prior to being transferred to the neuro ICU. Electronically Signed   By: Julieanne Cotton M.D.   On: 11/26/2016 21:18    Labs:  CBC:  Recent Labs  01/26/16 1405 11/25/16 1347 11/26/16 0420 11/27/16 0425  WBC 2.8 Repeated and verified X2.* 3.1* 4.5 4.9  HGB 15.6 15.4 13.9 13.2  HCT 45.9 46.0 42.0 40.9  PLT 183.0 188 185 166    COAGS:  Recent Labs  11/25/16 1347  INR 1.04  APTT 33    BMP:  Recent Labs  01/26/16 1405 11/25/16 0116 11/25/16 1347 11/26/16 0420 11/27/16 0425  NA 139  --  136 139 140  K 4.0  --  4.1 3.9 3.9  CL 103  --  104 110 108  CO2 29  --  24 23 24   GLUCOSE 77  --  98 109* 112*  BUN 16  --  14 8 6   CALCIUM 8.9  --  9.3 7.9* 7.2*  CREATININE 1.04 1.03 1.01 0.99 1.05  GFRNONAA  --  >60 >60 >60 >60  GFRAA  --  >60 >60 >60 >60    LIVER FUNCTION  TESTS:  Recent Labs  01/26/16 1405 11/25/16 1347  BILITOT 0.8 0.8  AST 18 25  ALT 20 23  ALKPHOS 52 52  PROT 7.2 7.6  ALBUMIN 4.2 4.4    Assessment and Plan: Revascularization of occluded sup division of right MCA with x1 pass with 4mm x 40 mm solitaire FR retrieval device achieving a TICI 2b reperfusion Revascularization of symptomatic near complete occlusion of right ICA proximally with stent assisted angioplasty.  Patient to transition to Brilinta. First dose this afternoon after placement of NG tube confirmed.  Continue Brilinta 90 mg BID, aspirin 81mg  once daily.  Continue with rehabilitation and encourage speech/vocalization.  Reheck P2Y12 tomorrow preferrably after 2nd dose of Brilinta.  Electronically Signed: Hoyt Koch, PA 11/27/2016, 3:14 PM   I spent a total of 15 Minutes at the the patient's bedside AND on the patient's hospital floor or unit, greater than 50% of which was counseling/coordinating care for stroke.

## 2016-11-27 NOTE — Evaluation (Signed)
Clinical/Bedside Swallow Evaluation Patient Details  Name: Jason CroakJames Nolan MRN: [id number] Date of Birth: [birth year]  Today's Date: 11/27/2016 Time: SLP Start Time (ACUTE ONLY): 1200 SLP Stop Time (ACUTE ONLY): 1220 SLP Time Calculation (min) (ACUTE ONLY): 20 min  Past Medical History:  Past Medical History:  Diagnosis Date  . Glaucoma   . Hepatitis C    Past Surgical History:  Past Surgical History:  Procedure Laterality Date  . RADIOLOGY WITH ANESTHESIA N/A 11/25/2016   Procedure: RADIOLOGY WITH ANESTHESIA;  Surgeon: Radiologist, Medication, MD;  Location: [facility] OR;  Service: Radiology;  Laterality: N/A;   HPI:  71 year old male with PMH as below, which is significant for Hepatitic C s/p treatment with Harvoni. He was last known normal at 2am on 7/23 when he went to sleep. He woke up around 9am with left sided weakness, facial droop, and slurred speech. 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 [facility]. He underwent successful revascularization of R MCA and R ICA. Post-procedurally he remained on ventilator and was transferred to ICU. Concern for potential hemorrhage post IR proedure, but may have been extravasation of dye and not a bleed. Intubated 7/23 to 7/25 in am. 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 Pt is left handed.    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 Pt with pooled secretions in the left buccal cavity, anterior spilalge of liquids on the left and oral residuals. Pharyngeal function is difficult to assess subjectively; but pt noted to have audible multiple swallows, intermittent wet vocal quality but no coughing. Suspect pharyngeal residuals. Recommend pt recieve short term means of alternate nutrition given need for reliable oral  means of medication administration today. Will f/u tomorrow for objective assessment of swallow function via MBS. Pt and family in agreement.  SLP Visit Diagnosis: Dysphagia, oropharyngeal phase (R13.12)    Aspiration Risk  Severe aspiration risk    Diet Recommendation NPO;Alternative means - temporary        Other  Recommendations Oral Care Recommendations: Oral care QID   Follow up Recommendations Inpatient Rehab      Frequency and Duration            Prognosis        Swallow Study   General HPI: 71 year old male with PMH as below, which is significant for Hepatitic C s/p treatment with Harvoni. He was last known normal at 2am on 7/23 when he went to sleep. He woke up around 9am with left sided weakness, facial droop, and slurred speech. 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 [facility]. He underwent successful revascularization of R MCA and R ICA. Post-procedurally he remained on ventilator and was transferred to ICU. Concern for potential hemorrhage post IR proedure, but may have been extravasation of dye and not a bleed. Intubated 7/23 to 7/25 in am. 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 Pt is left handed.  Type of Study: Bedside Swallow Evaluation Previous Swallow Assessment: none Diet Prior to this Study: NPO Temperature Spikes Noted: No Respiratory Status: Room air History of Recent Intubation: Yes Length of Intubations (days): 3 days Date extubated: 11/27/16 Behavior/Cognition:  Alert;Cooperative Oral Cavity Assessment: Excessive secretions Oral Care Completed by SLP: No Oral Cavity - Dentition: Adequate natural dentition Vision:  (bilateral ptosis) Self-Feeding Abilities: Needs assist Patient Positioning: Upright in bed Baseline Vocal Quality: Wet Volitional Cough: Strong Volitional Swallow: Able to elicit    Oral/Motor/Sensory Function  Overall Oral Motor/Sensory Function: Severe impairment Facial ROM: Reduced left;Suspected CN VII (facial) dysfunction Facial Symmetry: Abnormal symmetry left;Suspected CN VII (facial) dysfunction Facial Strength: Reduced left;Suspected CN VII (facial) dysfunction Lingual ROM: Reduced left;Suspected CN XII (hypoglossal) dysfunction Lingual Symmetry: Abnormal symmetry left;Suspected CN XII (hypoglossal) dysfunction Lingual Strength: Reduced;Suspected CN XII (hypoglossal) dysfunction Lingual Sensation: Reduced;Suspected CN VII (facial) dysfunction-anterior 2/3 tongue Mandible: Within Functional Limits   Ice Chips     Thin Liquid Thin Liquid: Impaired Presentation: Cup;Straw;Self Fed Oral Phase Impairments: Reduced labial seal;Reduced lingual movement/coordination Oral Phase Functional Implications: Oral residue;Left anterior spillage;Left lateral sulci pocketing Pharyngeal  Phase Impairments: Suspected delayed Swallow;Wet Vocal Quality;Multiple swallows    Nectar Thick Nectar Thick Liquid: Not tested   Honey Thick Honey Thick Liquid: Not tested   Puree Puree: Impaired Presentation: Spoon Oral Phase Impairments: Reduced labial seal;Reduced lingual movement/coordination Oral Phase Functional Implications: Left lateral sulci pocketing;Oral residue Pharyngeal Phase Impairments: Multiple swallows;Wet Vocal Quality   Solid   GO   Solid: Not tested       Jason DittyBonnie Datron Brakebill, MA CCC-SLP [phone]  [name], Jason Nolan 11/27/2016,2:02 PM

## 2016-11-27 NOTE — Progress Notes (Signed)
PT Cancellation Note  Patient Details Name: Jason CroakJames Hupfer MRN: [id number] DOB: 23-Apr-1946   Cancelled Treatment:    Reason Eval/Treat Not Completed: Patient not medically ready. Pt currently on bedrest, will await increased activity order before initiating PT eval.   Marylynn PearsonLaura D Syann Cupples 11/27/2016, 7:48 AM   Conni SlipperLaura Jasminemarie Sherrard, PT, DPT Acute Rehabilitation Services Pager: [phone]

## 2016-11-27 NOTE — Progress Notes (Signed)
STROKE TEAM PROGRESS NOTE   SUBJECTIVE (INTERVAL HISTORY) His nurse is at the bedside.  The patient remains intubated and on sedation.  With sedation off he opens eyes to voice and follows all commands appropriately.  Patient extubated at 0948, tolerated well.     OBJECTIVE Temp:  [98.2 F (36.8 C)-100.9 F (38.3 C)] 99.1 F (37.3 C) (07/25 1200) Pulse Rate:  [51-107] 59 (07/25 1200) Resp:  [11-19] 15 (07/25 1200) BP: (105-160)/(53-86) 118/61 (07/25 1200) SpO2:  [99 %-100 %] 100 % (07/25 1200) FiO2 (%):  [40 %] 40 % (07/25 0317)  CBC:   Recent Labs Lab 11/25/16 1347 11/26/16 0420 11/27/16 0425  WBC 3.1* 4.5 4.9  NEUTROABS 1.7 2.9  --   HGB 15.4 13.9 13.2  HCT 46.0 42.0 40.9  MCV 90.6 91.3 92.5  PLT 188 185 166    Basic Metabolic Panel:   Recent Labs Lab 11/26/16 0420 11/27/16 0425  NA 139 140  K 3.9 3.9  CL 110 108  CO2 23 24  GLUCOSE 109* 112*  BUN 8 6  CREATININE 0.99 1.05  CALCIUM 7.9* 7.2*  MG  --  1.6*  PHOS  --  3.1    Lipid Panel:     Component Value Date/Time   CHOL 193 11/26/2016 0420   TRIG 197 (H) 11/26/2016 0420   HDL 57 11/26/2016 0420   CHOLHDL 3.4 11/26/2016 0420   VLDL 39 11/26/2016 0420   LDLCALC 97 11/26/2016 0420   HgbA1c:  Lab Results  Component Value Date   HGBA1C 5.4 11/26/2016   Urine Drug Screen: No results found for: LABOPIA, COCAINSCRNUR, LABBENZ, AMPHETMU, THCU, LABBARB  Alcohol Level No results found for: ETH  IMAGING  Ct Angio Head W Or Wo Contrast Ct Angio Neck W Or Wo Contrast 11/25/2016 IMPRESSION: 1. 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 Right ICA somewhat diffusely narrow and irregular distally to the skullbase. 2. Positive CTA for emergent large vessel occlusion with right M2 occlusion and evolving right frontal lobe infarct involving the right insula and frontal  operculum. Moderate surrounding penumbra as above. 3. Additional mild for age atherosclerotic changes above. No other high-grade or correctable stenosis.  Ct Head Wo Contrast  11/25/2016 IMPRESSION: 1. Serpiginous subarachnoid hyperdensity within the right sylvian fissure and overlying right frontal lobe, most consistent with contrast leakage from recent catheter directed arteriogram. Superimposed hemorrhage not entirely excluded. Attention at follow-up recommended. 2. Evolving acute right MCA territory infarct.  Ct Head Wo Contrast 11/25/2016 IMPRESSION: 1. 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 Hyperdense right middle cerebral artery indicates acute or subacute occlusion. 2. No associated hemorrhage, hematoma, mass effect or midline shift at this time.  Ct Cerebral Perfusion W Contrast  11/25/2016 IMPRESSION: 1. 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 Right ICA somewhat diffusely narrow and irregular distally to the skullbase. 2. Positive CTA for emergent large vessel occlusion with right M2 occlusion and evolving right frontal lobe infarct involving the right insula and frontal operculum. Moderate surrounding penumbra as above. 3. Additional mild for age atherosclerotic changes above. No other high-grade or correctable stenosis.  Portable Chest Xray 11/25/2016 IMPRESSION: 1. Endotracheal tube tip about 4.3 cm superior to carina 2. Patchy atelectasis or infiltrate at the left lung base 3. Upper normal mediastinal with,  likely augmented by low lung volume.  MRI Head 11/25/2016 IMPRESSION: 1. Right insula and frontal parietal junction acute infarction with volume of 39 cc. Several additional punctate foci of acute infarction are present scattered throughout the right frontal lobe, right parietal lobe, right occipital lobe, and left  superior cerebellar hemisphere. 2. Stable right sylvian and posterior frontal convexity extra-axial signal abnormality from extravasated contrast and/or subarachnoid emorrhage in comparison with prior CT given differences in technique.   2D Echo 11/26/2016 Study Conclusions - Left ventricle: The cavity size was normal. Systolic function was normal. The estimated ejection fraction was in the range of 60% to 65%. Wall motion was normal; there were no regional wall motion abnormalities. Left ventricular diastolic function  parameters were normal.  Carotid US 11/26/2016 Summary: - Findings consistent with 40 - 59 percent stenosis involving the   proximal right internal carotid artery. Unable to clearly visualize the distal end of the carotid stent, visualized portions appear patent. - Findings consistent with a 1-39 percent stenosis involving the left internal carotid artery with elevated systolic velocities. - Bilateral vertebral arteries appear patent with antegrade flow.  Elevated velocities noted bilaterally, left greater than right.     PHYSICAL EXAM Pleasant middle aged african [place] male not in distress.intubated. Rt groin arterial sheath inplace during am rounds. . Afebrile. Head is nontraumatic. Neck is supple without bruit.    Cardiac exam no murmur or gallop. Lungs are clear to auscultation. Distal pulses are well felt.   Neurological Exam :  Intubated. Drowsy but opens eyes easily and follows simple midline commands as well as moves all 4 extremities to command. Pupils 4 mm equal reactive. Fundi not visualized. Blinks to threat more on the right than the left. Mild right lower facial weakness. Tongue midline. Motor system exam antigravity good strength on the right. Mild withdrawal in the left lower and upper extremity but left hemiplegia. Deep tendon flexes are depressed on the left compared to the right. Right plantar downgoing left is upgoing. Gait not  tested. ASSESSMENT/PLAN Jason Nolan is a 71 y.o. male with history of hepatitis C treated with Harvoni, who presented with Left-sided weakness, slurred speech, left facial droop.  He did not receive IV t-PA due to arriving outside of the treatment window.   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  Carotid Doppler: 40-59% R ICA stenosis, 1-39% L ICA stenosis.  Bilateral antegrade VA flow.  2D Echo: EF 60-65%. No source of embolus  LDL 97  HgbA1c 5.4  SCDs for VTE prophylaxis Diet NPO time specified Diet NPO time specified  No antithrombotic prior to admission, now on aspirin 325 mg daily and clopidogrel 75 mg daily  Patient counseled to be compliant with his antithrombotic medications  Ongoing aggressive stroke risk factor management  Therapy recommendations: pending   Disposition:   pending  Hypertension  Stable  Permissive hypertension (OK if < 220/120) but gradually normalize in 5-7 days  Long-term BP goal normotensive  Hyperlipidemia  Home meds: none  LDL 97,  goal < 70  Start atorvastatin 40mg  PO daily  Continue statin after discharge  Other Stroke Risk Factors  Advanced age  Obesity, Body mass index is 30.65 kg/m., recommend weight loss, diet and exercise as appropriate   Other Active Problems  None  Hospital day # 2  I have personally examined this patient, reviewed notes, independently viewed  imaging studies, participated in medical decision making and plan of care.ROS completed by me personally and pertinent positives fully documented  I have made any additions or clarifications directly to the above note. He presented with left hemiplegia secondary to embolic occlusion of the superior division of the right middle cerebral artery from high-grade proximal right ICA stenosis. He underwent emergent angioplasty and stenting of the proximal right ICA stenosis with mechanical embolectomy of the occluded superior division of the right MCA. Recommends strict blood pressure control and close neurological monitoring as per post intervention protocol. Discontinue groin sheath and consider extubation later if tolerated. Check MRI scan of the brain later this evening. Continue aspirin and Plavix for carotid stent aspirin neuro interventional team recommendations. Family not available at the bedside for discussion. Discussed with Dr. Molli Knock and Dr. Corliss Skains. This patient is critically ill and at significant risk of neurological worsening, death and care requires constant monitoring of vital signs, hemodynamics,respiratory and cardiac monitoring, extensive review of multiple databases, frequent neurological assessment, discussion with family, other specialists and medical decision making of high complexity.I have made any additions or clarifications directly to the above note.This critical care time does not reflect procedure time, or teaching time or supervisory time of PA/NP/Med Resident etc but could involve care discussion time.  I spent 35 minutes of neurocritical care time  in the care of  this patient.      Delia Heady, MD Medical Director [facility] Pager: [phone] 11/27/2016 3:16 PM   To contact Stroke Continuity provider, please refer to [email or web address]. After hours, contact General Neurology

## 2016-11-27 NOTE — Progress Notes (Signed)
PULMONARY / CRITICAL CARE MEDICINE   Name: Jason Nolan MRN: [id number] DOB: 01-05-1946    ADMISSION DATE:  11/25/2016 CONSULTATION DATE:  7/23  REFERRING MD:  Dr. Pearlean BrownieSethi  CHIEF COMPLAINT:  CVA  HISTORY OF PRESENT ILLNESS:  Patient is encephalopathic and/or intubated. Therefore history has been obtained from chart review.  71 year old male with PMH as below, which is significant for Hepatitic C s/p treatment with Harvoni. He was last known normal at 2am on 7/23 when he went to sleep. He woke up around 9am with left sided weakness, facial droop, and slurred speech. [facility] where he was found to have a R MCA CVA, but was outside window for tpa. [facility]. He underwent successful revascularization of R MCA and R ICA. Post-procedurally he remained on ventilator and was transferred to ICU. PCCM asked to assist with ventilatory management and ICU care.   SUBJECTIVE:  No events overnight, on wake up assessment was following commands by RN report  VITAL SIGNS: BP 118/63   Pulse (!) 53   Temp 98.2 F (36.8 C) (Axillary)   Resp 16   Ht 5\' 10"  (1.778 m)   Wt 96.9 kg (213 lb 10 oz)   SpO2 100%   BMI 30.65 kg/m   HEMODYNAMICS:    VENTILATOR SETTINGS: Vent Mode: PRVC FiO2 (%):  [40 %] 40 % Set Rate:  [16 bmp] 16 bmp Vt Set:  [580 mL] 580 mL PEEP:  [5 cmH20] 5 cmH20 Pressure Support:  [8 cmH20] 8 cmH20 Plateau Pressure:  [10 cmH20-16 cmH20] 16 cmH20  INTAKE / OUTPUT: I/O last 3 completed shifts: In: 3755.8 [I.V.:3755.8] Out: 3170 [Urine:3170]  PHYSICAL EXAMINATION: General:  Adult male of normal body habitus in NAD Neuro:  Sedate but withdraws to pain on propofol HEENT:  [place]/AT, PERRL, no JVD. EOM-I and MMM Cardiovascular:  RRR, NL S1/S2, -M/R/G. Lungs:  CTA bilaterally Abdomen:  Soft, NT, ND and +BS Musculoskeletal:  No acute deformity or ROM limitation Skin:  Grossly intact  LABS:  BMET  Recent Labs Lab 11/25/16 1347  11/26/16 0420 11/27/16 0425  NA 136 139 140  K 4.1 3.9 3.9  CL 104 110 108  CO2 24 23 24   BUN 14 8 6   CREATININE 1.01 0.99 1.05  GLUCOSE 98 109* 112*   Electrolytes  Recent Labs Lab 11/25/16 1347 11/26/16 0420 11/27/16 0425  CALCIUM 9.3 7.9* 7.2*  MG  --   --  1.6*  PHOS  --   --  3.1   CBC  Recent Labs Lab 11/25/16 1347 11/26/16 0420 11/27/16 0425  WBC 3.1* 4.5 4.9  HGB 15.4 13.9 13.2  HCT 46.0 42.0 40.9  PLT 188 185 166   Coag's  Recent Labs Lab 11/25/16 1347  APTT 33  INR 1.04   Sepsis Markers No results for input(s): LATICACIDVEN, PROCALCITON, O2SATVEN in the last 168 hours.  ABG  Recent Labs Lab 11/25/16 2100 11/27/16 0432  PHART 7.364 7.410  PCO2ART 39.0 37.5  PO2ART 195* 135*   Liver Enzymes  Recent Labs Lab 11/25/16 1347  AST 25  ALT 23  ALKPHOS 52  BILITOT 0.8  ALBUMIN 4.4   Cardiac Enzymes  Recent Labs Lab 11/25/16 1347  TROPONINI <0.03   Glucose  Recent Labs Lab 11/25/16 1352  GLUCAP 88   Imaging Mr Brain Wo Contrast  Result Date: 11/26/2016 CLINICAL DATA:  71 y/o M; clot retrieval and carotid stent yesterday, evaluate extent of  stroke. EXAM: MRI HEAD WITHOUT CONTRAST TECHNIQUE: Multiplanar, multiecho pulse sequences of the brain and surrounding structures were obtained without intravenous contrast. COMPARISON:  11/25/2016 CT of the head and CT angiogram of the head. FINDINGS: Brain: Region of reduced diffusion compatible with acute infarction measures 3.9 x 4.1 x 4.7 cm (volume = 39 cm^3) (AP x ML x CC, series 300, image 33 and series 400, image 17). The infarct is centered in the right frontoparietal junction and insula. There are a few additional punctate scattered foci throughout the right MCA distribution, within the right occipital lobe, and within the left superior cerebellar hemisphere. Areas of infarction demonstrate associated T2 hyperintense signal abnormality and minimal local mass effect. Incomplete FLAIR  suppression of sulci with minimal SWI hypointensity within the right sylvian fissure and overlying the right posterior frontal lobe infarction which may represent a combination of extravasated contrast and/or hemorrhage as seen on prior CT of the head. The distribution is stable from prior CT. No extra-axial collection, hydrocephalus, or herniation. Vascular: Normal central flow voids. Skull and upper cervical spine: Nonspecific low signal focus within the right parietal bone near the vertex measuring 20 mm (series 9, image 91) with sclerosis on CT, probably a bone island. Sinuses/Orbits: Moderate diffuse paranasal sinus mucosal thickening with small fluid levels in the sphenoid sinuses. Trace mastoid effusions. Paranasal sinus disease and mastoid effusions are probably due to intubation. Other: None. IMPRESSION: 1. Right insula and frontal parietal junction acute infarction with volume of 39 cc. Several additional punctate foci of acute infarction are present scattered throughout the right frontal lobe, right parietal lobe, right occipital lobe, and left superior cerebellar hemisphere. 2. Stable right sylvian and posterior frontal convexity extra-axial signal abnormality from extravasated contrast and/or subarachnoid hemorrhage in comparison with prior CT given differences in technique. Electronically Signed   By: Mitzi Hansen M.D.   On: 11/26/2016 22:29   Dg Chest Port 1 View  Result Date: 11/27/2016 CLINICAL DATA:  Acute CVA, intubated patient EXAM: PORTABLE CHEST 1 VIEW COMPARISON:  Portable chest x-ray of November 25, 2016 FINDINGS: The lungs are adequately inflated. There is subsegmental atelectasis in the perihilar regions greatest on the left. There is no alveolar infiltrate. There is no pleural effusion or pneumothorax. The heart and pulmonary vascularity are normal. The endotracheal tube tip lies approximately 4.8 cm above the carina. The esophagogastric tube tip and proximal port project below  the inferior margin of the image. IMPRESSION: Perihilar subsegmental atelectasis. No pneumonia nor pulmonary edema. The support tubes are in reasonable position. Electronically Signed   By: David  [place] M.D.   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 Hyperdense right middle cerebral artery indicates acute or subacute occlusion. CT angio/perfusion head 7/23 > Short-segment near occlusion of the proximal right ICA with associated intraluminal thrombus as above. Positive CTA for emergent large vessel occlusion with right M2 occlusion and evolving right frontal lobe infarct involving the right insula and frontal operculum. Moderate surrounding penumbra as above. Ct head post op 7/23 > Serpiginous subarachnoid hyperdensity within the right sylvian fissure and overlying right frontal lobe, most consistent with contrast leakage from recent catheter directed arteriogram. Superimposed hemorrhage not entirely excluded. Evolving acute right MCA territory infarct.  CULTURES:   ANTIBIOTICS:   SIGNIFICANT EVENTS:   LINES/TUBES: ETT 7/23 >7/25 Art 7/23 >7/24  DISCUSSION: 71 year old male admitted 7/23 for R MCA CVA and R internal carotid stenosis/occlusion. Underwent IR  procedure complicated by hemorrhage. Transferred to ICU on vent for further monitoring.  ASSESSMENT / PLAN:  PULMONARY A: Inability to protect airway in post-operative setting   P:  SBT to extubate today Titrate O2 for sat of 88-92% IS per RT protocol OOB PT  CARDIOVASCULAR A:  Hypertension - SBP 200 immediately post op  P:  Telemetry monitoring Goal SBP of 120-140 Cleviprex off Cardene drip continued Add norvasc 5 mg PO when able to take PO PRN hydralazine  RENAL A:   Hypomag  P:   Repeat BMP in AM Replace electrolytes as indicated NS 75 ml/hr until able to take PO Replace Mg  GASTROINTESTINAL A:   No acute issues  P:   NPO GI ppx:  IV protonix SLP when able  HEMATOLOGIC A:   Leukopenia   P:  Follow CBC Transfuse per ICU protocol  INFECTIOUS A:   No acute issues  P:   Follow CBC and fever curve Hold off abx for now  ENDOCRINE A:   No acute issues  P:   CBG monitoring q 4 SSI  NEUROLOGIC A:   R MCA CVA s/p clot retrieval revascularization  R carotic stenosis/occlusion s/p stented angioplasty SAH post op P:   RASS goal: -1 to -2 Propofol infusion for sedation to be discontinued post extubation PRN fentanyl for analgesia [place] service primary CT per neuro Norvasc for BP control  FAMILY  - Updates: No family bedside to update  - Inter-disciplinary family meet or Palliative Care meeting due by:  7/30  The patient is critically ill with multiple organ systems failure and requires high complexity decision making for assessment and support, frequent evaluation and titration of therapies, application of advanced monitoring technologies and extensive interpretation of multiple databases.   Critical Care Time devoted to patient care services described in this note is  35  Minutes. This time reflects time of care of this signee Dr Koren BoundWesam Coltrane Tugwell. This critical care time does not reflect procedure time, or teaching time or supervisory time of PA/NP/Med student/Med Resident etc but could involve care discussion time.  Alyson ReedyWesam G. Yasmeen Manka, M.D. [facility] Pulmonary/Critical Care Medicine. Pager: [phone]. After hours pager: [phone].  11/27/2016 8:30 AM

## 2016-11-28 ENCOUNTER — Inpatient Hospital Stay (HOSPITAL_COMMUNITY): Payer: Medicare Other

## 2016-11-28 DIAGNOSIS — I69320 Aphasia following cerebral infarction: Secondary | ICD-10-CM

## 2016-11-28 DIAGNOSIS — I69391 Dysphagia following cerebral infarction: Secondary | ICD-10-CM

## 2016-11-28 DIAGNOSIS — I69398 Other sequelae of cerebral infarction: Secondary | ICD-10-CM

## 2016-11-28 DIAGNOSIS — R269 Unspecified abnormalities of gait and mobility: Secondary | ICD-10-CM

## 2016-11-28 DIAGNOSIS — I63411 Cerebral infarction due to embolism of right middle cerebral artery: Principal | ICD-10-CM

## 2016-11-28 LAB — PHOSPHORUS: PHOSPHORUS: 2.9 mg/dL (ref 2.5–4.6)

## 2016-11-28 LAB — CBC
HEMATOCRIT: 38.3 % — AB (ref 39.0–52.0)
HEMOGLOBIN: 12.6 g/dL — AB (ref 13.0–17.0)
MCH: 30.8 pg (ref 26.0–34.0)
MCHC: 32.9 g/dL (ref 30.0–36.0)
MCV: 93.6 fL (ref 78.0–100.0)
Platelets: 154 10*3/uL (ref 150–400)
RBC: 4.09 MIL/uL — ABNORMAL LOW (ref 4.22–5.81)
RDW: 14.3 % (ref 11.5–15.5)
WBC: 4.2 10*3/uL (ref 4.0–10.5)

## 2016-11-28 LAB — BASIC METABOLIC PANEL
ANION GAP: 6 (ref 5–15)
BUN: 8 mg/dL (ref 6–20)
CHLORIDE: 107 mmol/L (ref 101–111)
CO2: 25 mmol/L (ref 22–32)
CREATININE: 1.05 mg/dL (ref 0.61–1.24)
Calcium: 8 mg/dL — ABNORMAL LOW (ref 8.9–10.3)
GFR calc non Af Amer: >60 mL/min (ref >60)
GLUCOSE: 106 mg/dL — AB (ref 65–99)
Potassium: 3.6 mmol/L (ref 3.5–5.1)
Sodium: 138 mmol/L (ref 135–145)

## 2016-11-28 LAB — MAGNESIUM: Magnesium: 2.2 mg/dL (ref 1.7–2.4)

## 2016-11-28 LAB — PLATELET INHIBITION P2Y12: PLATELET FUNCTION P2Y12: 52 [PRU] — AB (ref 194–418)

## 2016-11-28 MED ORDER — POTASSIUM CHLORIDE 20 MEQ/15ML (10%) PO SOLN
40.0000 meq | Freq: Once | ORAL | Status: AC
  Administered 2016-11-28: 40 meq
  Filled 2016-11-28: qty 30

## 2016-11-28 NOTE — Progress Notes (Signed)
I will follow up with pt and family tomorrow to discuss a possible inpt rehab admit when pt medically ready for d/c. [phone]

## 2016-11-28 NOTE — Evaluation (Signed)
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 He is now status post right ICA angioplasty and stenting with thrombectomy of the right superior division middle cerebral artery.  Clinical Impression  Pt admitted with above diagnosis. Pt currently with functional limitations due to the deficits listed below (see PT Problem List). At the time of PT eval pt was able to communicate via head and hand gestures. Appeared to understand everything that was being told to him, however pt unable to verbally communicate a response. +2 assist was provided for all aspects of mobility, and pt was able to transition to the recliner with +2 mod assist for balance support and safety. It appears that pt was very active and independent PTA. Pt will benefit from skilled PT to increase their independence and safety with mobility to facilitate transition to CIR at d/c.       Follow Up Recommendations CIR;Supervision/Assistance - 24 hour    Equipment Recommendations  Wheelchair (measurements PT);Wheelchair cushion (measurements PT);Rolling walker with 5" wheels;3in1 (PT)    Recommendations for Other Services Rehab consult     Precautions / Restrictions Precautions Precautions: Fall Precaution Comments: Permissive HTN <220/120  Restrictions Weight Bearing Restrictions: No      Mobility  Bed Mobility Overal bed mobility: Needs Assistance Bed Mobility: Supine to Sit     Supine to sit: Min assist;+2 for physical assistance     General bed mobility comments: Assist for LE advancement to [name], as well as for trunk elevation to full sitting. VC's for railing use with RUE.    Transfers Overall transfer level: Needs assistance Equipment used: 2 person hand held assist Transfers: Sit to/from [name];Stand Pivot Transfers Sit to Stand: Mod assist;+2 physical assistance Stand pivot transfers: Mod assist;+2 physical assistance       General transfer comment: +2 assist to power-up to full standing position. L knee blocked and LUE supported throughout transfer. Once standing, pt able to improve posture with cues but was unable to maintain. Pt took a few pivotal steps around to the recliner with support at gait belt and assist for weight shift.   Ambulation/Gait                Stairs            Wheelchair Mobility    Modified Rankin (Stroke Patients Only) Modified Rankin (Stroke Patients Only) Pre-Morbid Rankin Score: No symptoms Modified Rankin: Moderately severe disability     Balance Overall balance assessment: Needs assistance Sitting-balance support: Feet supported;No upper extremity supported Sitting balance-Leahy Scale: Fair     Standing balance support: Bilateral upper extremity supported;During functional activity Standing balance-Leahy Scale: Poor                               Pertinent Vitals/Pain Pain Assessment: No/denies pain    Home Living Family/patient expects to be discharged to:: Private residence Living Arrangements: Spouse/significant other Available Help at Discharge: Family;Available 24 hours/day Type of Home: House Home Access: Level entry     Home Layout: One level Home Equipment: Shower seat - built in      Prior Function Level of Independence: Independent  Comments: Pt is retired. States he exercises     Hand Dominance   Dominant Hand: Left    Extremity/Trunk Assessment   Upper Extremity Assessment Upper Extremity Assessment: Defer to OT evaluation    Lower Extremity Assessment Lower Extremity Assessment: LLE deficits/detail LLE Deficits / Details: Grossly 4-/5  strength in hip flexors, quads, hams. Pt with good muscle tone in bilateral LE's.  LLE Sensation: decreased light touch (Pt reports numbness from foot to thigh on L)    Cervical / Trunk Assessment Cervical / Trunk Assessment: Normal  Communication   Communication: Expressive difficulties  Cognition Arousal/Alertness: Awake/alert Behavior During Therapy: WFL for tasks assessed/performed Overall Cognitive Status: Within Functional Limits for tasks assessed                                 General Comments: Significant expressive aphasia - pt unable to answer questions verbally however was answering therapist with head and hand gestures.       General Comments      Exercises     Assessment/Plan    PT Assessment Patient needs continued PT services  PT Problem List Decreased strength;Decreased range of motion;Decreased activity tolerance;Decreased balance;Decreased mobility;Decreased safety awareness;Decreased knowledge of use of DME;Decreased knowledge of precautions;Impaired tone;Impaired sensation       PT Treatment Interventions DME instruction;Gait training;Stair training;Functional mobility training;Therapeutic activities;Therapeutic exercise;Neuromuscular re-education;Patient/family education    PT Goals (Current goals can be found in the Care Plan section)  Acute Rehab PT Goals Patient Stated Goal: Pt not able to state goals. PT Goal Formulation: Patient unable to participate in goal setting Time For Goal Achievement: 12/12/16 Potential to Achieve Goals: Good    Frequency Min 4X/week   Barriers to discharge        Co-evaluation PT/OT/SLP Co-Evaluation/Treatment: Yes Reason for Co-Treatment: Complexity of the patient's impairments (multi-system involvement);Necessary to address cognition/behavior during functional activity;For patient/therapist safety;To address functional/ADL transfers           AM-PAC PT "6 Clicks" Daily Activity  Outcome Measure  Difficulty turning over in bed (including adjusting bedclothes, sheets and blankets)?: Total Difficulty moving from lying on back to sitting on the side of the bed? : Total Difficulty sitting down on and standing up from a chair with arms (e.g., wheelchair, bedside commode, etc,.)?: Total Help needed moving to and from a bed to chair (including a wheelchair)?: A Lot Help needed walking in hospital room?: Total Help needed climbing 3-5 steps with a railing? : Total 6 Click Score: 7    End of Session Equipment Utilized During Treatment: Gait belt Activity Tolerance: Patient tolerated treatment well Patient left: in chair;with call bell/phone within reach;with chair alarm set Nurse Communication: Mobility status PT Visit Diagnosis: Unsteadiness on feet (R26.81);Hemiplegia and hemiparesis Hemiplegia - Right/Left: Left Hemiplegia - dominant/non-dominant: Dominant Hemiplegia - caused by: Cerebral infarction    Time: 1610-96041357-1425 PT Time Calculation (min) (ACUTE ONLY): 28 min   Charges:   PT Evaluation $PT Eval Moderate Complexity: 1 Procedure     PT G Codes:        Conni SlipperLaura Neesha Langton, PT, DPT Acute Rehabilitation Services Pager: [phone]   Marylynn PearsonLaura D Julieana Eshleman 11/28/2016, 2:58 PM

## 2016-11-28 NOTE — Progress Notes (Signed)
Modified Barium Swallow Progress Note  Patient Details  Name: Jason Nolan MRN: [id number] Date of Birth: 04-29-1946  Today's Date: 11/28/2016  Modified Barium Swallow completed.  Full report located under Chart Review in the Imaging Section.  Brief recommendations include the following:  Clinical Impression  Despite significant left lingual weakness and left labial/facial sensory loss, pt is able to tolerate thin liquids and masticate regular solids without oral residuals. There is mild anterior spillage, but no retained barium in the buccal cavity. Oropharyngeal phase WNL. Primary concern is pt biting his tongue during mastication, possibly without awareness. Recommend pt attempt a dys 3 mechanical soft diet with thin liquids, but will downgrade texture as needed if he cannot manage his tongue. May also need to attempt compensatory strategies for anterior loss. Will follow for tolerance.    Swallow Evaluation Recommendations       SLP Diet Recommendations: Dysphagia 3 (Mech soft) solids;Thin liquid   Liquid Administration via: Cup;Straw   Medication Administration: Whole meds with liquid   Supervision: Patient able to self feed   Compensations: Other (Comment) (check for tongue biting with mastication)   Postural Changes: Remain semi-upright after after feeds/meals (Comment);Seated upright at 90 degrees   Oral Care Recommendations: Oral care BID       Jason DittyBonnie Quron Ruddy, MA CCC-SLP [phone]  [facility], Jason Nolan 11/28/2016,12:18 PM

## 2016-11-28 NOTE — Progress Notes (Signed)
Rehab Admissions Coordinator Note:  Patient was screened by Clois DupesBoyette, Sharonica Kraszewski Godwin for appropriateness for an Inpatient Acute Rehab Consult.  At this time, we are recommending Inpatient Rehab consult.  Clois DupesBoyette, Skylynn Burkley Godwin 11/28/2016, 3:43 PM  I can be reached at [phone].

## 2016-11-28 NOTE — Evaluation (Signed)
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 He is now status post right ICA angioplasty and stenting with thrombectomy of the right superior division middle cerebral artery.   Clinical Impression   Pt reports he was independent with ADL PTA. Currently pt requires mod assist +2 for stand pivot transfer and mod-total assist for ADL. Pt presenting with L sided weakness/decreased sensation, expressive difficulties but able to communicate via head nods and hand gestures, and impaired sitting/standing balance impacting his independence and safety with ADL and functional mobility. Recommending CIR level therapies to maximize independence and safety with ADL and functional mobility prior to return home. Pt would benefit from continued skilled OT to address established goals.    Follow Up Recommendations  CIR;Supervision/Assistance - 24 hour    Equipment Recommendations  Other (comment) (TBD at next venue)    Recommendations for Other Services       Precautions / Restrictions Precautions Precautions: Fall Precaution Comments: Permissive HTN <220/120  Restrictions Weight Bearing Restrictions: No      Mobility Bed Mobility Overal bed mobility: Needs Assistance Bed Mobility: Supine to Sit     Supine to sit: Min assist;+2 for physical assistance     General bed mobility comments: Assist for LE advancement to [name], as well as for trunk elevation to full sitting. VC's for railing use with RUE.   Transfers Overall transfer level: Needs assistance Equipment used: 2 person hand held assist Transfers: Sit to/from [name];Stand Pivot Transfers Sit to  Stand: Mod assist;+2 physical assistance Stand pivot transfers: Mod assist;+2 physical assistance       General transfer comment: +2 assist to power-up to full standing position. L knee blocked and LUE supported throughout transfer. Once standing, pt able to improve posture with cues but was unable to maintain. Pt took a few pivotal steps around to the recliner with support at gait belt and assist for weight shift.     Balance Overall balance assessment: Needs assistance Sitting-balance support: Feet supported;No upper extremity supported Sitting balance-Leahy Scale: Fair     Standing balance support: Bilateral upper extremity supported;During functional activity Standing balance-Leahy Scale: Poor                             ADL either performed or assessed with clinical judgement   ADL Overall ADL's : Needs assistance/impaired Eating/Feeding: Moderate assistance;Sitting Eating/Feeding Details (indicate cue type and reason): Pt able to bring cup to mouth to expell secretions Grooming: Maximal assistance;Sitting   Upper Body Bathing: Maximal assistance;Sitting   Lower Body Bathing: Total assistance;Sit to/from stand   Upper Body Dressing : Maximal assistance;Sitting Upper Body Dressing Details (indicate cue type and reason): to don gown Lower Body Dressing: Total assistance Lower Body Dressing Details (indicate cue type and reason): to don socks Toilet Transfer: Moderate assistance;+2 for physical assistance;Stand-pivot Toilet Transfer Details (indicate cue type and reason): Simulated by transfer [name] > chair         Functional mobility during ADLs: Moderate assistance;+2 for physical assistance General ADL Comments: Discussed post acute rehab with pt; he is agreeable. Educated pt on protecting RUE from injury, propping on pillow for edema control and protection.  Vision Baseline Vision/History: Wears glasses Wears Glasses: Reading only Patient Visual Report:  No change from baseline Additional Comments: Appears WFL     Perception     Praxis      Pertinent Vitals/Pain Pain Assessment: No/denies pain     Hand Dominance Left   Extremity/Trunk Assessment Upper Extremity Assessment Upper Extremity Assessment: LUE deficits/detail LUE Deficits / Details: Grossly 3/5. Pt reports numbness in LUE (distal>proximal). Increased edema noted. LUE Sensation: decreased light touch LUE Coordination: decreased fine motor;decreased gross motor   Lower Extremity Assessment Lower Extremity Assessment: Defer to PT evaluation LLE Deficits / Details: Grossly 4-/5 strength in hip flexors, quads, hams. Pt with good muscle tone in bilateral LE's.  LLE Sensation: decreased light touch (Pt reports numbness from foot to thigh on L)   Cervical / Trunk Assessment Cervical / Trunk Assessment: Normal   Communication Communication Communication: Expressive difficulties   Cognition Arousal/Alertness: Awake/alert Behavior During Therapy: WFL for tasks assessed/performed Overall Cognitive Status: Within Functional Limits for tasks assessed                                 General Comments: Significant expressive aphasia - pt unable to answer questions verbally however was answering therapist with head and hand gestures.    General Comments       Exercises     Shoulder Instructions      Home Living Family/patient expects to be discharged to:: Private residence Living Arrangements: Spouse/significant other Available Help at Discharge: Family;Available 24 hours/day Type of Home: House Home Access: Level entry     Home Layout: One level     Bathroom Shower/Tub: Walk-in shower;Door   [employer] Toilet: Pharmacist, community: Yes   Home Equipment: Shower seat - built in          Prior Functioning/Environment Level of Independence: Independent        Comments: Pt is retired. States he exercises        OT Problem List:  Decreased strength;Decreased range of motion;Decreased activity tolerance;Impaired balance (sitting and/or standing);Decreased coordination;Decreased knowledge of use of DME or AE;Impaired sensation;Impaired tone;Impaired UE functional use;Increased edema      OT Treatment/Interventions: Self-care/ADL training;Therapeutic exercise;Neuromuscular education;Energy conservation;DME and/or AE instruction;Therapeutic activities;Patient/family education;Balance training    OT Goals(Current goals can be found in the care plan section) Acute Rehab OT Goals Patient Stated Goal: Pt not able to state goals. OT Goal Formulation: With patient Time For Goal Achievement: 12/12/16 Potential to Achieve Goals: Good ADL Goals Pt Will Perform Eating: with min assist;sitting Pt Will Perform Grooming: with min assist;sitting Pt Will Transfer to Toilet: with mod assist;ambulating;bedside commode Additional ADL Goal #1: Pt will perform bed mobility with min guard assist as precursor to ADL.  OT Frequency: Min 3X/week   Barriers to D/C:            Co-evaluation PT/OT/SLP Co-Evaluation/Treatment: Yes Reason for Co-Treatment: Complexity of the patient's impairments (multi-system involvement);Necessary to address cognition/behavior during functional activity;For patient/therapist safety;To address functional/ADL transfers   OT goals addressed during session: ADL's and self-care      AM-PAC PT "6 Clicks" Daily Activity     Outcome Measure Help from another person eating meals?: A Lot Help from another person taking care of personal grooming?: A Lot Help from another person toileting, which includes using toliet, bedpan, or urinal?: A Lot Help from another person bathing (including washing, rinsing, drying)?: A Lot Help from another person  to put on and taking off regular upper body clothing?: A Lot Help from another person to put on and taking off regular lower body clothing?: Total 6 Click Score: 11   End  of Session Equipment Utilized During Treatment: Gait belt Nurse Communication: Mobility status  Activity Tolerance: Patient tolerated treatment well Patient left: in chair;with call bell/phone within reach;with chair alarm set  OT Visit Diagnosis: Unsteadiness on feet (R26.81);Other abnormalities of gait and mobility (R26.89);Muscle weakness (generalized) (M62.81)                Time: 4540-98111359-1423 OT Time Calculation (min): 24 min Charges:  OT General Charges $OT Visit: 1 Procedure OT Evaluation $OT Eval Moderate Complexity: 1 Procedure G-Codes:     Jason Nolan A. Brett Albinooffey, M.S., OTR/L Pager: [phone]  Gaye AlkenBailey A Salvatore Nolan 11/28/2016, 4:07 PM

## 2016-11-28 NOTE — Progress Notes (Signed)
PULMONARY / CRITICAL CARE MEDICINE   Name: Jason Nolan MRN: [id number] DOB: 18-Jul-1945    ADMISSION DATE:  11/25/2016 CONSULTATION DATE:  7/23  REFERRING MD:  Dr. Pearlean BrownieSethi  CHIEF COMPLAINT:  CVA  HISTORY OF PRESENT ILLNESS:  Patient is encephalopathic and/or intubated. Therefore history has been obtained from chart review.  71 year old male with PMH as below, which is significant for Hepatitic C s/p treatment with Harvoni. He was last known normal at 2am on 7/23 when he went to sleep. He woke up around 9am with left sided weakness, facial droop, and slurred speech. [facility] he was found to have a R MCA CVA, but was outside window for tpa. [facility]. He underwent successful revascularization of R MCA and R ICA. Post-procedurally he remained on ventilator and was transferred to ICU. PCCM asked to assist with ventilatory management and ICU care.   SUBJECTIVE:  Extubated and doing well off vent  VITAL SIGNS: BP (!) 115/56   Pulse (!) 59   Temp 99.8 F (37.7 C) (Oral)   Resp 18   Ht 5\' 10"  (1.778 m)   Wt 96.9 kg (213 lb 10 oz)   SpO2 100%   BMI 30.65 kg/m   HEMODYNAMICS:    VENTILATOR SETTINGS:    INTAKE / OUTPUT: I/O last 3 completed shifts: In: 4812.9 [I.V.:4812.9] Out: 1620 [Urine:1620]  PHYSICAL EXAMINATION: General:  Well developed, extubated, NAD Neuro:  Awake and interactive, moving ext to command but weak on left HEENT:  [place]/AT, PERRL, EOM-I and MMM Cardiovascular:  RRR, Nl S1/S2, -M/R/G. Lungs:  CTA bilaterally Abdomen:  Soft, NT, ND and +BS Musculoskeletal:  No acute deformity or ROM limitation Skin:  Grossly intact  LABS:  BMET  Recent Labs Lab 11/26/16 0420 11/27/16 0425 11/28/16 0216  NA 139 140 138  K 3.9 3.9 3.6  CL 110 108 107  CO2 23 24 25   BUN 8 6 8   CREATININE 0.99 1.05 1.05  GLUCOSE 109* 112* 106*   Electrolytes  Recent Labs Lab 11/26/16 0420 11/27/16 0425 11/28/16 0216  CALCIUM  7.9* 7.2* 8.0*  MG  --  1.6* 2.2  PHOS  --  3.1 2.9   CBC  Recent Labs Lab 11/26/16 0420 11/27/16 0425 11/28/16 0216  WBC 4.5 4.9 4.2  HGB 13.9 13.2 12.6*  HCT 42.0 40.9 38.3*  PLT 185 166 154   Coag's  Recent Labs Lab 11/25/16 1347  APTT 33  INR 1.04   Sepsis Markers No results for input(s): LATICACIDVEN, PROCALCITON, O2SATVEN in the last 168 hours.  ABG  Recent Labs Lab 11/25/16 2100 11/27/16 0432  PHART 7.364 7.410  PCO2ART 39.0 37.5  PO2ART 195* 135*   Liver Enzymes  Recent Labs Lab 11/25/16 1347  AST 25  ALT 23  ALKPHOS 52  BILITOT 0.8  ALBUMIN 4.4   Cardiac Enzymes  Recent Labs Lab 11/25/16 1347  TROPONINI <0.03   Glucose  Recent Labs Lab 11/25/16 1352  GLUCAP 88   Imaging Dg Abd Portable 1v  Result Date: 11/27/2016 CLINICAL DATA:  Feeding tube placement EXAM: PORTABLE ABDOMEN - 1 VIEW COMPARISON:  None. FINDINGS: Feeding tube tip is in the distal stomach. Nonobstructive bowel gas pattern with mild gaseous distention of bowel diffusely. IMPRESSION: Feeding tube tip in the distal stomach. Electronically Signed   By: Charlett NoseKevin  Dover M.D.   On: 11/27/2016 14:39   STUDIES:  CT head 7/23 > Early acute right middle cerebral  artery distribution stroke. Hyperdense right middle cerebral artery indicates acute or subacute occlusion. CT angio/perfusion head 7/23 > Short-segment near occlusion of the proximal right ICA with associated intraluminal thrombus as above. Positive CTA for emergent large vessel occlusion with right M2 occlusion and evolving right frontal lobe infarct involving the right insula and frontal operculum. Moderate surrounding penumbra as above. Ct head post op 7/23 > Serpiginous subarachnoid hyperdensity within the right sylvian fissure and overlying right frontal lobe, most consistent with contrast leakage from recent catheter directed arteriogram. Superimposed hemorrhage not entirely excluded. Evolving acute right MCA territory  infarct.  CULTURES:   ANTIBIOTICS:   SIGNIFICANT EVENTS:   LINES/TUBES: ETT 7/23 >7/25 Art 7/23 >7/24  I reviewed CXR myself, no acute disease noted.  DISCUSSION: 71 year old male admitted 7/23 for R MCA CVA and R internal carotid stenosis/occlusion. Underwent IR procedure complicated by hemorrhage. Transferred to ICU on vent for further monitoring.  ASSESSMENT / PLAN:  PULMONARY A: Inability to protect airway in post-operative setting   P:  Titrate O2 for sat of 88-92% IS per RT protocol OOB PT SLP ordered  CARDIOVASCULAR A:  Hypertension - SBP 200 immediately post op  P:  Telemetry monitoring Goal SPB of 120-140 Cleviprex off D/C Cardene drip PRN hydralazine  RENAL A:   Hypomag  P:   BMP in AM Replace electrolytes as indicated KVO IVF  GASTROINTESTINAL A:   No acute issues  P:   NPO GI ppx: IV protonix SLP when able  HEMATOLOGIC A:   Leukopenia   P:  Follow CBC Transfuse per ICU protocol  INFECTIOUS A:   No acute issues  P:   Follow CBC and fever curve Hold off abx for now  ENDOCRINE A:   No acute issues  P:   CBG monitoring q 4 SSI  NEUROLOGIC A:   R MCA CVA s/p clot retrieval revascularization  R carotic stenosis/occlusion s/p stented angioplasty SAH post op P:   RASS goal: -1 to -2 Propofol infusion for sedation to be discontinued post extubation PRN fentanyl for analgesia [place] service primary CT per neuro Norvasc for BP control  FAMILY  - Updates: No family bedside to update  - Inter-disciplinary family meet or Palliative Care meeting due by:  7/30  PCCM will sign off, please call back if needed.  Alyson ReedyWesam G. Antwon Rochin, M.D. [facility] Pulmonary/Critical Care Medicine. Pager: [phone]. After hours pager: [phone].  11/28/2016 9:35 AM

## 2016-11-28 NOTE — Progress Notes (Signed)
Patient ID: Jason Nolan, male   DOB: 01-22-1946, 71 y.o.   MRN: [id number]    Referring Physician(s): Dr. Delia Heady  Supervising Physician: Julieanne Cotton  Patient Status: [facility] - In-pt  Chief Complaint: CVA  Subjective: Patient trying to speak, but still having difficulty with that.  Allergies: Patient has no known allergies.  Medications: Prior to Admission medications   Medication Sig Start Date End Date Taking? Authorizing Provider  PAZEO 0.7 % SOLN Place 1 drop into both eyes daily.  10/11/16  Yes [provider]  VYZULTA 0.024 % SOLN Apply 1 drop to eye 2 (two) times daily.  11/12/16  Yes [provider]    Vital Signs: BP (!) 115/56   Pulse (!) 59   Temp 99.8 F (37.7 C) (Oral)   Resp 18   Ht 5\' 10"  (1.778 m)   Wt 213 lb 10 oz (96.9 kg)   SpO2 100%   BMI 30.65 kg/m   Physical Exam: Neuro: still with left facial droop, left tongue deviation, dysarthria, able to move his left side.  He can bend and move his LLE, unable to move his LUE.  Imaging: Ct Angio Head W Or Wo Contrast  Result Date: 11/25/2016 CLINICAL DATA:  Initial evaluation for acute dysarthria, stroke. EXAM: CT ANGIOGRAPHY HEAD AND NECK CT PERFUSION BRAIN TECHNIQUE: Multidetector CT imaging of the head and neck was performed using the standard protocol during bolus administration of intravenous contrast. Multiplanar CT image reconstructions and MIPs were obtained to evaluate the vascular anatomy. Carotid stenosis measurements (when applicable) are obtained utilizing NASCET criteria, using the distal internal carotid diameter as the denominator. Multiphase CT imaging of the brain was performed following IV bolus contrast injection. Subsequent parametric perfusion maps were calculated using RAPID software. CONTRAST:  100 cc of Isovue 370. COMPARISON:  Prior head CT from earlier the same day. 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 No evidence for hemorrhage or mass effect. Overall, this is stable to minimally progressed from scan performed earlier the same day and. No other acute large vessel territory infarct. No acute intracranial hemorrhage. No mass lesion, midline shift or mass effect. No hydrocephalus. No extra-axial fluid collection. Vascular: Focal hyperdensity within distal right M1/proximal right M2 branches, consistent with thrombus. Vascular calcifications noted within the carotid siphons. Skull: Scalp soft tissues and calvarium within normal limits. Sinuses/Orbits: Globes and orbital soft tissues within normal limits. Mild mucosal thickening within the maxillary sinuses. Paranasal sinuses are otherwise clear. No mastoid effusion. ASPECTS [facility] Stroke Program Early CT Score) - Ganglionic level infarction (caudate, lentiform nuclei, internal capsule, insula, M1-M3 cortex): 5 - Supraganglionic infarction (M4-M6 cortex): 2 Total score (0-10 with 10 being normal): 7 Review of the MIP images confirms the above findings CTA NECK FINDINGS Aortic arch: Visualized aortic arch of normal caliber with normal branch pattern. Minimal plaque within the arch itself. No flow-limiting stenosis about the origin of the great vessels. Visualized subclavian arteries widely patent. Right carotid system: Right common carotid artery widely patent from its origin to the bifurcation. A centric calcified plaque about the right bifurcation/proximal right ICA. There is abrupt near occlusion of the proximal right ICA at the bifurcation (series 12, image 202). A focal string sign is present. Intraluminal filling defect just distally consistent with thrombus. Area of involvement begins at the bifurcation and measures approximately 15 mm in length. A focal ruptured plaque  with associated acute dissection is suspected. Distally, the right ICA is somewhat diminutive in caliber and  irregular, but is patent to the skullbase without additional focal stenosis or other vascular abnormality. Left carotid system: Left common carotid artery widely patent from its origin to the bifurcation. Minimal plaque about the left bifurcation without stenosis. Left ICA widely patent distally to the skullbase without stenosis, dissection, or occlusion. Vertebral arteries: Left vertebral artery arises separately from the aortic arch. Right vertebral artery rises from the right subclavian artery. Right vertebral artery slightly dominant. Vertebral arteries patent within the neck without stenosis, dissection, or occlusion. Skeleton: No acute osseus abnormality. No worrisome lytic or blastic osseous lesions. Mild to moderate multilevel degenerative spondylolysis within the cervical spine, greatest at C5-6. Other neck: No acute soft tissue abnormality within the neck. Salivary glands normal. No adenopathy. Thyroid normal. Upper chest: Visualized upper chest within normal limits. Visualized lungs are clear. Review of the MIP images confirms the above findings CTA HEAD FINDINGS Anterior circulation: Petrous, cavernous, and supraclinoid segments patent bilaterally without flow-limiting stenosis. Minimal plaque noted within the cavernous ICAs. ICA termini widely patent. A1 segments patent bilaterally. Anterior communicating artery normal. Anterior cerebral artery is patent to their distal aspects. Right M1 segment patent to its distal aspect. There is occlusion of proximal right M2 branch at the base of the right sylvian fissure (series 12, image 96), likely embolic from thrombus seen in the neck. Right MCA branches demonstrate multifocal irregularity with stenoses distally. Left M1 segment patent to its distal aspect. No proximal left M2 occlusion. Left MCA branches well opacified to their distal aspects. Posterior circulation: Vertebral artery's patent to the vertebrobasilar junction without stenosis. Patent left PICA.  Dominant right AICA. Basilar artery widely patent to its distal aspect. Superior cerebral arteries patent bilaterally. PCA supplied via the basilar as well is prominent bilateral posterior communicating arteries. PCAs patent to their distal aspects. Venous sinuses: Patent. Anatomic variants: No significant anatomic variant. No aneurysm or vascular malformation. Delayed phase: Not performed. Review of the MIP images confirms the above findings CT Brain Perfusion Findings: CBF (<30%) Volume: 0mL Perfusion (Tmax>6.0s) volume: 86mL Mismatch Volume: 86mL Infarction Location:While no core infarct is registered, there is evidence of evolving ischemia within the right insula and frontal operculum on prior noncontrast head CT. This is suspected to be mass by collateralization. Evidence of moderate surrounding penumbra within the right frontal lobe on associated perfusion map. IMPRESSION: 1. 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 Right ICA somewhat diffusely narrow and irregular distally to the skullbase. 2. Positive CTA for emergent large vessel occlusion with right M2 occlusion and evolving right frontal lobe infarct involving the right insula and frontal operculum. Moderate surrounding penumbra as above. 3. Additional mild for age atherosclerotic changes above. No other high-grade or correctable stenosis. Critical Value/emergent results were called by telephone at the time of interpretation on 11/25/2016 at 4:16 pm to Dr. Milon Dikes , who verbally acknowledged these results. Electronically Signed   By: Rise Mu M.D.   On: 11/25/2016 16:53   Ct Head Wo Contrast  Result Date: 11/25/2016 CLINICAL DATA:  Follow-up exam status post catheter directed thrombectomy. EXAM: CT HEAD WITHOUT CONTRAST TECHNIQUE: Contiguous axial images were obtained from the base of the skull  through the vertex without intravenous contrast. COMPARISON:  Prior CTs from earlier the same day. FINDINGS: Brain: Serpiginous hyperdensity seen layering throughout the  right sylvian fissure and multiple cortical sulci of the overlying right frontal lobe, likely reflecting contrast leakage from recent arteriogram. Superimposed hemorrhage not excluded. Evolving hypodensity within the right insula an overlying operculum and right frontal lobe consistent with right MCA territory stroke. Overall, this has slightly evolved from previous. No other acute intracranial hemorrhage. No other acute large vessel territory infarct. No mass lesion or midline shift. No hydrocephalus. No extra-axial fluid collection. Vascular: Diffuse hyperdensity throughout the intracranial vasculature, like related IV contrast. Skull: Scalp soft tissues and calvarium within normal limits. Sinuses/Orbits: Globes and orbital soft tissues within normal limits. Scattered mucosal thickening within the ethmoidal air cells, sphenoid sinuses, and maxillary sinuses. Mastoid air cells remain clear. IMPRESSION: 1. Serpiginous subarachnoid hyperdensity within the right sylvian fissure and overlying right frontal lobe, most consistent with contrast leakage from recent catheter directed arteriogram. Superimposed hemorrhage not entirely excluded. Attention at follow-up recommended. 2. Evolving acute right MCA territory infarct. Electronically Signed   By: Rise Mu M.D.   On: 11/25/2016 20:27   Ct Head Wo Contrast  Result Date: 11/25/2016 CLINICAL DATA:  71 year old who woke up this morning with facial droop, left upper extremity weakness and slurred speech. EXAM: CT HEAD WITHOUT CONTRAST TECHNIQUE: Contiguous axial images were obtained from the base of the skull through the vertex without intravenous contrast. COMPARISON:  None. FINDINGS: Brain: Ventricular system normal in size and appearance for age. Mild changes of small vessel disease of the  white matter. Asymmetric low attenuation involving the white matter of the right superior temporal lobe extending into the right frontal lobe. No associated hemorrhage or hematoma. No extra-axial fluid collections. No visible mass lesion. No midline shift. Mild cerebellar vermian atrophy. No cortical atrophy. Vascular: Hyperdense right middle cerebral artery. Mild bilateral carotid siphon atherosclerosis. Skull: No skull fracture or other focal osseous abnormality involving the skull. Sinuses/Orbits: Visualized paranasal sinuses, bilateral mastoid air cells and bilateral middle ear cavities well-aerated. Visualized orbits and globes are normal. Other: None. IMPRESSION: 1. 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 Hyperdense right middle cerebral artery indicates acute or subacute occlusion. 2. No associated hemorrhage, hematoma, mass effect or midline shift at this time. I personally telephoned these results at the time of interpretation on 11/25/2016 at 1:51 pm to Dr. Rockne Menghini, who verbally acknowledged these results. Electronically Signed   By: Hulan Saas M.D.   On: 11/25/2016 13:53   Ct Angio Neck W Or Wo Contrast  Result Date: 11/25/2016 CLINICAL DATA:  Initial evaluation for acute dysarthria, stroke. EXAM: CT ANGIOGRAPHY HEAD AND NECK CT PERFUSION BRAIN TECHNIQUE: Multidetector CT imaging of the head and neck was performed using the standard protocol during bolus administration of intravenous contrast. Multiplanar CT image reconstructions and MIPs were obtained to evaluate the vascular anatomy. Carotid stenosis measurements (when applicable) are obtained utilizing NASCET criteria, using the distal internal carotid diameter as the denominator. Multiphase CT imaging of the brain was performed following IV bolus contrast injection. Subsequent parametric perfusion maps were calculated using RAPID software. CONTRAST:  100 cc of Isovue 370. COMPARISON:  Prior head CT from  earlier the same day. 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 No evidence for hemorrhage or mass effect. Overall, this is stable to minimally progressed from scan performed earlier the same day and. No other acute large vessel territory infarct. No acute intracranial hemorrhage. No mass lesion, midline shift or  mass effect. No hydrocephalus. No extra-axial fluid collection. Vascular: Focal hyperdensity within distal right M1/proximal right M2 branches, consistent with thrombus. Vascular calcifications noted within the carotid siphons. Skull: Scalp soft tissues and calvarium within normal limits. Sinuses/Orbits: Globes and orbital soft tissues within normal limits. Mild mucosal thickening within the maxillary sinuses. Paranasal sinuses are otherwise clear. No mastoid effusion. ASPECTS [facility] Stroke Program Early CT Score) - Ganglionic level infarction (caudate, lentiform nuclei, internal capsule, insula, M1-M3 cortex): 5 - Supraganglionic infarction (M4-M6 cortex): 2 Total score (0-10 with 10 being normal): 7 Review of the MIP images confirms the above findings CTA NECK FINDINGS Aortic arch: Visualized aortic arch of normal caliber with normal branch pattern. Minimal plaque within the arch itself. No flow-limiting stenosis about the origin of the great vessels. Visualized subclavian arteries widely patent. Right carotid system: Right common carotid artery widely patent from its origin to the bifurcation. A centric calcified plaque about the right bifurcation/proximal right ICA. There is abrupt near occlusion of the proximal right ICA at the bifurcation (series 12, image 202). A focal string sign is present. Intraluminal filling defect just distally consistent with thrombus. Area of involvement begins at the bifurcation and measures approximately 15 mm in length. A focal ruptured  plaque with associated acute dissection is suspected. Distally, the right ICA is somewhat diminutive in caliber and irregular, but is patent to the skullbase without additional focal stenosis or other vascular abnormality. Left carotid system: Left common carotid artery widely patent from its origin to the bifurcation. Minimal plaque about the left bifurcation without stenosis. Left ICA widely patent distally to the skullbase without stenosis, dissection, or occlusion. Vertebral arteries: Left vertebral artery arises separately from the aortic arch. Right vertebral artery rises from the right subclavian artery. Right vertebral artery slightly dominant. Vertebral arteries patent within the neck without stenosis, dissection, or occlusion. Skeleton: No acute osseus abnormality. No worrisome lytic or blastic osseous lesions. Mild to moderate multilevel degenerative spondylolysis within the cervical spine, greatest at C5-6. Other neck: No acute soft tissue abnormality within the neck. Salivary glands normal. No adenopathy. Thyroid normal. Upper chest: Visualized upper chest within normal limits. Visualized lungs are clear. Review of the MIP images confirms the above findings CTA HEAD FINDINGS Anterior circulation: Petrous, cavernous, and supraclinoid segments patent bilaterally without flow-limiting stenosis. Minimal plaque noted within the cavernous ICAs. ICA termini widely patent. A1 segments patent bilaterally. Anterior communicating artery normal. Anterior cerebral artery is patent to their distal aspects. Right M1 segment patent to its distal aspect. There is occlusion of proximal right M2 branch at the base of the right sylvian fissure (series 12, image 96), likely embolic from thrombus seen in the neck. Right MCA branches demonstrate multifocal irregularity with stenoses distally. Left M1 segment patent to its distal aspect. No proximal left M2 occlusion. Left MCA branches well opacified to their distal aspects.  Posterior circulation: Vertebral artery's patent to the vertebrobasilar junction without stenosis. Patent left PICA. Dominant right AICA. Basilar artery widely patent to its distal aspect. Superior cerebral arteries patent bilaterally. PCA supplied via the basilar as well is prominent bilateral posterior communicating arteries. PCAs patent to their distal aspects. Venous sinuses: Patent. Anatomic variants: No significant anatomic variant. No aneurysm or vascular malformation. Delayed phase: Not performed. Review of the MIP images confirms the above findings CT Brain Perfusion Findings: CBF (<30%) Volume: 0mL Perfusion (Tmax>6.0s) volume: 86mL Mismatch Volume: 86mL Infarction Location:While no core infarct is registered, there is evidence of evolving ischemia within the right insula  and frontal operculum on prior noncontrast head CT. This is suspected to be mass by collateralization. Evidence of moderate surrounding penumbra within the right frontal lobe on associated perfusion map. IMPRESSION: 1. 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 Right ICA somewhat diffusely narrow and irregular distally to the skullbase. 2. Positive CTA for emergent large vessel occlusion with right M2 occlusion and evolving right frontal lobe infarct involving the right insula and frontal operculum. Moderate surrounding penumbra as above. 3. Additional mild for age atherosclerotic changes above. No other high-grade or correctable stenosis. Critical Value/emergent results were called by telephone at the time of interpretation on 11/25/2016 at 4:16 pm to Dr. Milon Dikes , who verbally acknowledged these results. Electronically Signed   By: Rise Mu M.D.   On: 11/25/2016 16:53   Mr Brain Wo Contrast  Result Date: 11/26/2016 CLINICAL DATA:  71 y/o M; clot retrieval and carotid stent  yesterday, evaluate extent of stroke. EXAM: MRI HEAD WITHOUT CONTRAST TECHNIQUE: Multiplanar, multiecho pulse sequences of the brain and surrounding structures were obtained without intravenous contrast. COMPARISON:  11/25/2016 CT of the head and CT angiogram of the head. FINDINGS: Brain: Region of reduced diffusion compatible with acute infarction measures 3.9 x 4.1 x 4.7 cm (volume = 39 cm^3) (AP x ML x CC, series 300, image 33 and series 400, image 17). The infarct is centered in the right frontoparietal junction and insula. There are a few additional punctate scattered foci throughout the right MCA distribution, within the right occipital lobe, and within the left superior cerebellar hemisphere. Areas of infarction demonstrate associated T2 hyperintense signal abnormality and minimal local mass effect. Incomplete FLAIR suppression of sulci with minimal SWI hypointensity within the right sylvian fissure and overlying the right posterior frontal lobe infarction which may represent a combination of extravasated contrast and/or hemorrhage as seen on prior CT of the head. The distribution is stable from prior CT. No extra-axial collection, hydrocephalus, or herniation. Vascular: Normal central flow voids. Skull and upper cervical spine: Nonspecific low signal focus within the right parietal bone near the vertex measuring 20 mm (series 9, image 91) with sclerosis on CT, probably a bone island. Sinuses/Orbits: Moderate diffuse paranasal sinus mucosal thickening with small fluid levels in the sphenoid sinuses. Trace mastoid effusions. Paranasal sinus disease and mastoid effusions are probably due to intubation. Other: None. IMPRESSION: 1. Right insula and frontal parietal junction acute infarction with volume of 39 cc. Several additional punctate foci of acute infarction are present scattered throughout the right frontal lobe, right parietal lobe, right occipital lobe, and left superior cerebellar hemisphere. 2. Stable  right sylvian and posterior frontal convexity extra-axial signal abnormality from extravasated contrast and/or subarachnoid hemorrhage in comparison with prior CT given differences in technique. Electronically Signed   By: Mitzi Hansen M.D.   On: 11/26/2016 22:29   Ir Delsa Sale Stent Cerv Carotid W/o Emb-prot Mod Sed  Result Date: 11/27/2016 INDICATION: Left sided weaknee and severe dysarthria EXAM: 1. EMERGENT LARGE VESSEL OCCLUSION THROMBOLYSIS (anterior CIRCULATION) 2. 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 COMPARISON:  CT angiogram of 11/25/2016, and CT perfusion scan of 11/25/2016. MEDICATIONS: Ancef 2 g IV antibiotic was administered within 1 hour of the procedure. ANESTHESIA/SEDATION: General anesthesia. CONTRAST:  Isovue 300 approximately 120 CC. FLUOROSCOPY TIME:  Fluoroscopy Time: 51 minutes 48 seconds (2968 mGy). COMPLICATIONS: None immediate. TECHNIQUE: Following  a full explanation of the procedure along with the potential associated complications, an informed witnessed consent was obtained from the patient and the patient's wide. The risks of intracranial hemorrhage of 10%, worsening neurological deficit, ventilator dependency, death and inability to revascularize were all reviewed in detail with the patient'swife. The patient was then put under general anesthesia by the Department of Anesthesiology at [facility]. The right groin was prepped and draped in the usual sterile fashion. Thereafter using modified Seldinger technique, transfemoral access into the right common femoral artery was obtained without difficulty. Over a 0.035 inch guidewire a 5 French Pinnacle sheath was inserted. Through this, and also over a 0.035 inch guidewire a 5 [place] JB 1 catheter was advanced to the aortic arch region and selectively positioned in the right subclavian artery and the left common carotid artery, the left vertebral  artery and the right common carotid artery. FINDINGS: The right subclavian arteriogram demonstrates the origin the right vertebral artery to be normal. The vessel is seen to opacify to the cranial skull base and opacifies the right vertebrobasilar junction and right posterior-inferior cerebellar artery. The opacified portion of the basilar artery and the right posterior cerebral artery appears grossly intact on the lateral images. The right common carotid arteriogram demonstrates extensive plaque extending from the distal right common carotid artery anterior wall advancing into the proximal right external carotid artery. The external carotid artery origin is mildly narrowed. Its branches are normally opacified. The right internal carotid artery just proximal to the bulb demonstrates a severe string-like pre occlusive stenosis. There is poststenotic dilatation of the bulb itself. The delayed images demonstrate slow ascent of contrast to the cranial skull base in the right internal carotid artery. The petrous, cavernous and supraclinoid segments demonstrate wide patency. Opacification of the right middle and right anterior cerebral artery is noted with poor visualization of the superior division of the right middle cerebral artery. Non-opacified blood is seen in the right middle cerebral artery proximally and the right anterior cerebral artery A1 segment probably from the posterior circulation via the posterior communicating artery. The left vertebral artery origin is from the aortic arch between the origins of the left common carotid artery and the left subclavian arteries. The vessel is seen to opacify to the cranial skull base. Wide patency is seen of the left vertebrobasilar junction and the left posterior-inferior cerebellar artery. The basilar artery, the superior cerebellar arteries and the anterior-inferior cerebellar arteries are seen to opacify into the capillary and venous phases. Opacification of the right  posterior cerebellar P1 segment is noted. There is prompt opacification via the right posterior communicating artery of the right middle cerebral artery distribution right M1 segment and transiently the right anterior cerebral artery A1 segment. The delayed arterial phase again demonstrates a large area of hypoperfusion to absent perfusion involving the entire perisylvian triangle. There is poor attempt at retrograde opacification of the anterior 2/3 of the perisylvian branches from the pericallosal and callosal marginal branches. The inferior division appears to be widely patent with opacification to the cortical subcortical areas. The left common carotid arteriogram demonstrates the left external carotid artery and its branches to be widely patent. The left internal carotid artery at the bulb to the cranial skull base opacifies normally. The petrous, the cavernous and the supraclinoid segments demonstrate wide patency as well. A left posterior communicating artery is seen opacifying the left posterior cerebral artery distribution and transiently the left superior cerebellar artery distribution. The left middle cerebral artery and  the left anterior cerebral artery opacify normally into the capillary and venous phases. Transient cross opacification via the anterior communicating artery of the right anterior cerebral artery A2 segment and distally is noted. PROCEDURE: ENDOVASCULAR REVASCULARIZATION OF PRE OCCLUSIVE RIGHT INTERNAL CAROTID ARTERY AT THE BULB, WITH REVASCULARIZATION OF OCCLUDED SUPERIOR DIVISION OF THE RIGHT MIDDLE CEREBRAL ARTERY WITH MECHANICAL THROMBECTOMY USING 1 PASS WITH THE SOLITAIRE FR 4 MM X 40 MM RETRIEVAL DEVICE The diagnostic JB 1 catheter in the right common carotid artery was exchanged over a 0.035 inch 300 cm Rosen exchange guidewire for an 8 French 55 cm Brite tip neurovascular sheath using biplane roadmap technique and constant fluoroscopic guidance. Good aspiration was obtained from  the side port of the neurovascular sheath. This was then connected to continuous heparinized saline infusion. Over the [employer] guidewire, an 8 [place] 85 cm FlowGate balloon guide catheter which had been prepped with 50% contrast and 50% heparinized saline infusion was advanced and positioned just proximal to the right common carotid bifurcation. The guidewire was removed. Good aspiration obtained from the hub of the 8 [place] FlowGate guide catheter. A gentle contrast injection demonstrated no evidence of spasms, dissections or intraluminal filling defects. A rapid transit 2 tip microcatheter was then advanced over a 0.0141 Softtip Synchro micro guidewire to the distal end of the [facility] guide catheter. Again using biplane roadmap technique and constant fluoroscopic guidance, in a coaxial manner and with constant heparinized saline infusion, the combination of the micro guidewire and the microcatheter advanced into the origin of the right internal carotid artery. The micro guidewire with the J-tip configuration to avoid dissections or inducing spasm was then manipulated using a torque device passed the string sign stenoses without difficulty and advanced in combination with the microcatheter to the horizontal petrous segment of the right internal carotid artery. The guidewire was removed. Good aspiration obtained from the hub of the rapid transit microcatheter. This was then exchanged out for a 300 cm 0.014 inch Softtip Transend exchange micro guidewire using biplane roadmap technique and constant fluoroscopic guidance. The exchange wire tip had a J configuration to avoid dissections or inducing spasm. A control arteriogram performed through the [facility] guide catheter in the right common carotid artery demonstrated no change in the high-grade stenosis with flow noted distally into the right internal carotid artery skull base. At this time, a 4 mm x 30 mm Viatrac 14 angioplasty balloon was prepped and purged  retrogradely with heparinized saline infusion. Using the rapid exchange technique, this was then advanced and positioned such that the distal and the proximal markers were adequate distant from the site of the severe focal stenosis. A slow control inflation was then performed using a micro inflation syringe device via micro tubing to 10 atmospheres achieving a diameter of 4.08 mm where it was maintained for approximately 20 seconds. The balloon was then gently deflated and retrieved proximally and removed. A control arteriogram performed through the 8 [place] FlowGate guide catheter demonstrated significantly improved caliber and flow through the internal carotid artery and distally. 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 Over the exchange Transend EX micro guidewire, a Trevo ProVue 021 microcatheter was advanced and positioned in the proximal cavernous segment. The guidewire was removed. Good aspiration obtained from the hub of the microcatheter. Through this, a 0.014 inch Softip Synchro micro guidewire with a J tip configuration was advanced with the microcatheter to the supraclinoid  right ICA. Using a torque device the micro guidewire was then gently advanced into the origin of the superior division. The micro micro guidewire was then gently advanced without difficulty into the M2 M3 region of the superior division followed by the microcatheter. At this time the [facility] was advanced into the origin of the right internal carotid artery. Good aspiration obtained from the hub of the microcatheter. A gentle contrast injection demonstrated safe position of the tip of the microcatheter with slow antegrade clearance of contrast. A 4 mm x 40 mm Solitaire FR retrieval device was then advanced in a coaxial manner and with constant heparinized saline infusion using biplane roadmap  technique and constant fluoroscopic guidance to the distal end of the microcatheter. The O ring on the delivery microcatheter was then gently loosened. There was slight forward gentle traction with the right hand on the delivery micro guidewire, with the left hand the delivery microcatheter was retrieved with the tip just proximal to the proximal portion of the opened retrieval device. A control arteriogram performed through the 8 [place] FlowGate guide catheter demonstrated only partial contrast advancement through the stented segment of the superior division. The balloon was then inflated in the origin of the right internal carotid artery for proximal flow arrest. The proximal portion of the retrieval device was then captured into the microcatheter. Thereafter using constant aspiration with a 60 mL syringe at the hub of the [facility] guide [facility], the combination of the microcatheter and the retrieval device was then gently retrieved and removed. Chunks of clots were noted trapped within the interstices of the retrieval device and also in the hub of the [employer] [place]. Aspiration was continued as the proximal flow arrest was released. Free back bleed of blood was noted at the hub of the 8 [place] FlowGate guide catheter. A control arteriogram performed through the 8 [place] FlowGate guide catheter demonstrated antegrade flow to the right internal carotid supraclinoid segments. Free flow was now noted through the superior division and inferior division of the right middle cerebral artery. The right anterior cerebral artery remained patent. A TICI 2b reperfusion had been established. There was no extravasation of contrast or of mass-effect on the major vessels intracranially. The rapid transit 2 tip microcatheter was then again advanced to the distal end of the 8 [place] FlowGate guide catheter just proximal to the origin of the right internal carotid artery with a 0.014 inch Softip Synchro micro guidewire. The micro  guidewire was then gently manipulated through the now angioplastied segment of the right internal carotid artery followed by the microcatheter. The combination was advanced to the horizontal petrous segment. The guidewire was removed. Good aspiration obtained from the hub of the microcatheter. This in turn was then exchanged for a 300 cm Transend Softip EX exchange micro guidewire under constant fluoroscopic guidance. The exchange guidewire had a J-tip configuration in the petrous segment. A control arteriogram performed through the 8 [place] FlowGate guide catheter continued to demonstrate excellent flow through the right internal carotid artery intracranially and proximally performed of the right common carotid artery and right internal carotid artery in their normal segments. It was elected to use a 6 mm - 8 mm x 40 mm Xact stent system. The stent carrying catheter was retrogradely flushed with heparinized saline infusion. Again using the rapid exchange technique, this was advanced under constant fluoroscopic guidance and positioned with the distal and the proximal markers of the stent optimally in relation to the angioplastied segment. This stent was  then deployed under constant fluoroscopic guidance without any difficulty. The delivery catheter was then retrieved and removed with the exchange guidewire being maintained in the petrous segment. A control arteriogram performed through 8 [place] FlowGate guide catheter demonstrated excellent apposition proximally and distally with excellent flow noted through the stented segment. A 5 mm x 30 mm Viatrac 14 angioplasty balloon was then prepped and purged with heparinized saline infusion. Again using the rapid exchange technique this was advanced with the distal and proximal markers positioned optimally in relation to the waist on the stented segment. A slow control inflation was then performed of the angioplasty catheter to 10 atmospheres achieving a diameter of 5.14  mm. This was maintained for approximately 20 seconds. The balloon was then gently deflated and retrieved and removed. A control arteriogram performed through the 8 [place] FlowGate guide catheter demonstrated excellent flow and apposition. No evidence of dissection was noted distal to the angioplastied segment of the right internal carotid artery. There was now free flow noted into the right middle cerebral artery and right anterior cerebral artery. The superior division remained widely open with a small focal area of hypoperfusion involving the posterior perisylvian branches. However, delayed retrograde opacification via the inferior division collaterals were noted. Mild caliber irregularity within the stented segment was noted on the 10 minute control arteriogram. This prompted the use of 1.8 mg of selective intra-arterial Integrilin into this stent. Control arteriograms were then performed at 20, 30 and 40 minutes post second angioplasty. These continued to demonstrate excellent flow through the stented segment of the right internal carotid artery and intracranially. The exchange micro guidewire was then gently retrieved and removed after the 20 minute control arteriogram. The 8 [place] FlowGate guide catheter and the 8 [place] Brite tip neurovascular sheath were then retrieved into the abdominal aorta and exchanged over a J-tip guidewire for a 9 [place] Pinnacle sheath. This was then connected to continuous heparinized saline infusion. The right groin appeared soft without evidence of a hematoma or bleeding. The distal pulses in both feet remained 1+ after the procedure unchanged compared to prior to the procedure. The patient was also loaded with 650 mg of aspirin and 300 mg of Plavix via an orogastric tube prior to performing the right internal carotid artery angioplasty. Also the patient was given approximately 2000 units of IV heparin at the beginning of the procedure. Patient was then transported to the CT  scanner for postprocedural CT scan of the brain. 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 1.5 mg of a selective intra-arterial Integrilin utilized within the segment of the right internal carotid artery. PLAN: Postprocedural CT scan of the brain prior to being transferred to the neuro ICU. Electronically Signed   By: Julieanne Cotton M.D.   On: 11/26/2016 21:18   Ct Cerebral Perfusion W Contrast  Result Date: 11/25/2016 CLINICAL DATA:  Initial evaluation for acute dysarthria, stroke. EXAM: CT ANGIOGRAPHY HEAD AND NECK CT PERFUSION BRAIN TECHNIQUE: Multidetector CT imaging of the head and neck was performed using the standard protocol during bolus administration of intravenous contrast. Multiplanar CT image reconstructions and MIPs were obtained to evaluate the vascular anatomy. Carotid stenosis measurements (when applicable) are obtained utilizing NASCET criteria, using  the distal internal carotid diameter as the denominator. Multiphase CT imaging of the brain was performed following IV bolus contrast injection. Subsequent parametric perfusion maps were calculated using RAPID software. CONTRAST:  100 cc of Isovue 370. COMPARISON:  Prior head CT from earlier the same day. 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 No evidence for hemorrhage or mass effect. Overall, this is stable to minimally progressed from scan performed earlier the same day and. No other acute large vessel territory infarct. No acute intracranial hemorrhage.  No mass lesion, midline shift or mass effect. No hydrocephalus. No extra-axial fluid collection. Vascular: Focal hyperdensity within distal right M1/proximal right M2 branches, consistent with thrombus. Vascular calcifications noted within the carotid siphons. Skull: Scalp soft tissues and calvarium within normal limits. Sinuses/Orbits: Globes and orbital soft tissues within normal limits. Mild mucosal thickening within the maxillary sinuses. Paranasal sinuses are otherwise clear. No mastoid effusion. ASPECTS [facility] Stroke Program Early CT Score) - Ganglionic level infarction (caudate, lentiform nuclei, internal capsule, insula, M1-M3 cortex): 5 - Supraganglionic infarction (M4-M6 cortex): 2 Total score (0-10 with 10 being normal): 7 Review of the MIP images confirms the above findings CTA NECK FINDINGS Aortic arch: Visualized aortic arch of normal caliber with normal branch pattern. Minimal plaque within the arch itself. No flow-limiting stenosis about the origin of the great vessels. Visualized subclavian arteries widely patent. Right carotid system: Right common carotid artery widely patent from its origin to the bifurcation. A centric calcified plaque about the right bifurcation/proximal right ICA. There is abrupt near occlusion of the proximal right ICA at the bifurcation (series 12, image 202). A focal string sign is present. Intraluminal filling defect just distally consistent with thrombus. Area of involvement begins at the bifurcation and measures approximately 15 mm in length. A focal ruptured plaque with associated acute dissection is suspected. Distally, the right ICA is somewhat diminutive in caliber and irregular, but is patent to the skullbase without additional focal stenosis or other vascular abnormality. Left carotid system: Left common carotid artery widely patent from its origin to the bifurcation. Minimal plaque about the left bifurcation without stenosis. Left ICA widely patent distally to  the skullbase without stenosis, dissection, or occlusion. Vertebral arteries: Left vertebral artery arises separately from the aortic arch. Right vertebral artery rises from the right subclavian artery. Right vertebral artery slightly dominant. Vertebral arteries patent within the neck without stenosis, dissection, or occlusion. Skeleton: No acute osseus abnormality. No worrisome lytic or blastic osseous lesions. Mild to moderate multilevel degenerative spondylolysis within the cervical spine, greatest at C5-6. Other neck: No acute soft tissue abnormality within the neck. Salivary glands normal. No adenopathy. Thyroid normal. Upper chest: Visualized upper chest within normal limits. Visualized lungs are clear. Review of the MIP images confirms the above findings CTA HEAD FINDINGS Anterior circulation: Petrous, cavernous, and supraclinoid segments patent bilaterally without flow-limiting stenosis. Minimal plaque noted within the cavernous ICAs. ICA termini widely patent. A1 segments patent bilaterally. Anterior communicating artery normal. Anterior cerebral artery is patent to their distal aspects. Right M1 segment patent to its distal aspect. There is occlusion of proximal right M2 branch at the base of the right sylvian fissure (series 12, image 96), likely embolic from thrombus seen in the neck. Right MCA branches demonstrate multifocal irregularity with stenoses distally. Left M1 segment patent to its distal aspect. No proximal left M2 occlusion. Left MCA branches well opacified  to their distal aspects. Posterior circulation: Vertebral artery's patent to the vertebrobasilar junction without stenosis. Patent left PICA. Dominant right AICA. Basilar artery widely patent to its distal aspect. Superior cerebral arteries patent bilaterally. PCA supplied via the basilar as well is prominent bilateral posterior communicating arteries. PCAs patent to their distal aspects. Venous sinuses: Patent. Anatomic variants: No  significant anatomic variant. No aneurysm or vascular malformation. Delayed phase: Not performed. Review of the MIP images confirms the above findings CT Brain Perfusion Findings: CBF (<30%) Volume: 0mL Perfusion (Tmax>6.0s) volume: 86mL Mismatch Volume: 86mL Infarction Location:While no core infarct is registered, there is evidence of evolving ischemia within the right insula and frontal operculum on prior noncontrast head CT. This is suspected to be mass by collateralization. Evidence of moderate surrounding penumbra within the right frontal lobe on associated perfusion map. IMPRESSION: 1. 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 Right ICA somewhat diffusely narrow and irregular distally to the skullbase. 2. Positive CTA for emergent large vessel occlusion with right M2 occlusion and evolving right frontal lobe infarct involving the right insula and frontal operculum. Moderate surrounding penumbra as above. 3. Additional mild for age atherosclerotic changes above. No other high-grade or correctable stenosis. Critical Value/emergent results were called by telephone at the time of interpretation on 11/25/2016 at 4:16 pm to Dr. Milon Dikes , who verbally acknowledged these results. Electronically Signed   By: Rise Mu M.D.   On: 11/25/2016 16:53   Dg Chest Port 1 View  Result Date: 11/27/2016 CLINICAL DATA:  Acute CVA, intubated patient EXAM: PORTABLE CHEST 1 VIEW COMPARISON:  Portable chest x-ray of November 25, 2016 FINDINGS: The lungs are adequately inflated. There is subsegmental atelectasis in the perihilar regions greatest on the left. There is no alveolar infiltrate. There is no pleural effusion or pneumothorax. The heart and pulmonary vascularity are normal. The endotracheal tube tip lies approximately 4.8 cm above the carina. The esophagogastric tube tip  and proximal port project below the inferior margin of the image. IMPRESSION: Perihilar subsegmental atelectasis. No pneumonia nor pulmonary edema. The support tubes are in reasonable position. Electronically Signed   By: David  [place] M.D.   On: 11/27/2016 07:22   Portable Chest Xray  Result Date: 11/25/2016 CLINICAL DATA:  Intubation EXAM: PORTABLE CHEST 1 VIEW COMPARISON:  None. FINDINGS: Endotracheal tube tip is about 4.3 cm superior to the carina. Esophageal tube tip is in the left upper quadrant. Low lung volumes. Atelectasis or mild infiltrate at the left lung base. Normal heart size. Mediastinal with upper normal, likely augmented by low lung volume. No pneumothorax. IMPRESSION: 1. Endotracheal tube tip about 4.3 cm superior to carina 2. Patchy atelectasis or infiltrate at the left lung base 3. Upper normal mediastinal with, likely augmented by low lung volume. Electronically Signed   By: Jasmine Pang M.D.   On: 11/25/2016 20:40   Dg Abd Portable 1v  Result Date: 11/27/2016 CLINICAL DATA:  Feeding tube placement EXAM: PORTABLE ABDOMEN - 1 VIEW COMPARISON:  None. FINDINGS: Feeding tube tip is in the distal stomach. Nonobstructive bowel gas pattern with mild gaseous distention of bowel diffusely. IMPRESSION: Feeding tube tip in the distal stomach. Electronically Signed   By: Charlett Nose M.D.   On: 11/27/2016 14:39   Ir Percutaneous Art Thrombectomy/infusion Intracranial Inc Diag Angio  Result Date: 11/27/2016 INDICATION: Left sided weaknee and severe dysarthria EXAM: 1. EMERGENT LARGE VESSEL  OCCLUSION THROMBOLYSIS (anterior CIRCULATION) 2. 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 COMPARISON:  CT angiogram of 11/25/2016, and CT perfusion scan of 11/25/2016. MEDICATIONS: Ancef 2 g IV antibiotic was administered within 1 hour of the procedure. ANESTHESIA/SEDATION: General anesthesia. CONTRAST:  Isovue 300 approximately 120 CC.  FLUOROSCOPY TIME:  Fluoroscopy Time: 51 minutes 48 seconds (2968 mGy). COMPLICATIONS: None immediate. TECHNIQUE: Following a full explanation of the procedure along with the potential associated complications, an informed witnessed consent was obtained from the patient and the patient's wide. The risks of intracranial hemorrhage of 10%, worsening neurological deficit, ventilator dependency, death and inability to revascularize were all reviewed in detail with the patient'swife. The patient was then put under general anesthesia by the Department of Anesthesiology at [facility]. The right groin was prepped and draped in the usual sterile fashion. Thereafter using modified Seldinger technique, transfemoral access into the right common femoral artery was obtained without difficulty. Over a 0.035 inch guidewire a 5 French Pinnacle sheath was inserted. Through this, and also over a 0.035 inch guidewire a 5 [place] JB 1 catheter was advanced to the aortic arch region and selectively positioned in the right subclavian artery and the left common carotid artery, the left vertebral artery and the right common carotid artery. FINDINGS: The right subclavian arteriogram demonstrates the origin the right vertebral artery to be normal. The vessel is seen to opacify to the cranial skull base and opacifies the right vertebrobasilar junction and right posterior-inferior cerebellar artery. The opacified portion of the basilar artery and the right posterior cerebral artery appears grossly intact on the lateral images. The right common carotid arteriogram demonstrates extensive plaque extending from the distal right common carotid artery anterior wall advancing into the proximal right external carotid artery. The external carotid artery origin is mildly narrowed. Its branches are normally opacified. The right internal carotid artery just proximal to the bulb demonstrates a severe string-like pre occlusive stenosis. There is  poststenotic dilatation of the bulb itself. The delayed images demonstrate slow ascent of contrast to the cranial skull base in the right internal carotid artery. The petrous, cavernous and supraclinoid segments demonstrate wide patency. Opacification of the right middle and right anterior cerebral artery is noted with poor visualization of the superior division of the right middle cerebral artery. Non-opacified blood is seen in the right middle cerebral artery proximally and the right anterior cerebral artery A1 segment probably from the posterior circulation via the posterior communicating artery. The left vertebral artery origin is from the aortic arch between the origins of the left common carotid artery and the left subclavian arteries. The vessel is seen to opacify to the cranial skull base. Wide patency is seen of the left vertebrobasilar junction and the left posterior-inferior cerebellar artery. The basilar artery, the superior cerebellar arteries and the anterior-inferior cerebellar arteries are seen to opacify into the capillary and venous phases. Opacification of the right posterior cerebellar P1 segment is noted. There is prompt opacification via the right posterior communicating artery of the right middle cerebral artery distribution right M1 segment and transiently the right anterior cerebral artery A1 segment. The delayed arterial phase again demonstrates a large area of hypoperfusion to absent perfusion involving the entire perisylvian triangle. There is poor attempt at retrograde opacification of the anterior 2/3 of the perisylvian branches from the pericallosal and callosal marginal branches. The inferior division appears to be widely patent with opacification to the cortical subcortical areas. The left common  carotid arteriogram demonstrates the left external carotid artery and its branches to be widely patent. The left internal carotid artery at the bulb to the cranial skull base opacifies  normally. The petrous, the cavernous and the supraclinoid segments demonstrate wide patency as well. A left posterior communicating artery is seen opacifying the left posterior cerebral artery distribution and transiently the left superior cerebellar artery distribution. The left middle cerebral artery and the left anterior cerebral artery opacify normally into the capillary and venous phases. Transient cross opacification via the anterior communicating artery of the right anterior cerebral artery A2 segment and distally is noted. PROCEDURE: ENDOVASCULAR REVASCULARIZATION OF PRE OCCLUSIVE RIGHT INTERNAL CAROTID ARTERY AT THE BULB, WITH REVASCULARIZATION OF OCCLUDED SUPERIOR DIVISION OF THE RIGHT MIDDLE CEREBRAL ARTERY WITH MECHANICAL THROMBECTOMY USING 1 PASS WITH THE SOLITAIRE FR 4 MM X 40 MM RETRIEVAL DEVICE The diagnostic JB 1 catheter in the right common carotid artery was exchanged over a 0.035 inch 300 cm Rosen exchange guidewire for an 8 French 55 cm Brite tip neurovascular sheath using biplane roadmap technique and constant fluoroscopic guidance. Good aspiration was obtained from the side port of the neurovascular sheath. This was then connected to continuous heparinized saline infusion. Over the [employer] guidewire, an 8 [place] 85 cm FlowGate balloon guide catheter which had been prepped with 50% contrast and 50% heparinized saline infusion was advanced and positioned just proximal to the right common carotid bifurcation. The guidewire was removed. Good aspiration obtained from the hub of the 8 [place] FlowGate guide catheter. A gentle contrast injection demonstrated no evidence of spasms, dissections or intraluminal filling defects. A rapid transit 2 tip microcatheter was then advanced over a 0.0141 Softtip Synchro micro guidewire to the distal end of the [facility] guide catheter. Again using biplane roadmap technique and constant fluoroscopic guidance, in a coaxial manner and with constant heparinized  saline infusion, the combination of the micro guidewire and the microcatheter advanced into the origin of the right internal carotid artery. The micro guidewire with the J-tip configuration to avoid dissections or inducing spasm was then manipulated using a torque device passed the string sign stenoses without difficulty and advanced in combination with the microcatheter to the horizontal petrous segment of the right internal carotid artery. The guidewire was removed. Good aspiration obtained from the hub of the rapid transit microcatheter. This was then exchanged out for a 300 cm 0.014 inch Softtip Transend exchange micro guidewire using biplane roadmap technique and constant fluoroscopic guidance. The exchange wire tip had a J configuration to avoid dissections or inducing spasm. A control arteriogram performed through the [facility] guide catheter in the right common carotid artery demonstrated no change in the high-grade stenosis with flow noted distally into the right internal carotid artery skull base. At this time, a 4 mm x 30 mm Viatrac 14 angioplasty balloon was prepped and purged retrogradely with heparinized saline infusion. Using the rapid exchange technique, this was then advanced and positioned such that the distal and the proximal markers were adequate distant from the site of the severe focal stenosis. A slow control inflation was then performed using a micro inflation syringe device via micro tubing to 10 atmospheres achieving a diameter of 4.08 mm where it was maintained for approximately 20 seconds. The balloon was then gently deflated and retrieved proximally and removed. A control arteriogram performed through the 8 [place] FlowGate guide catheter demonstrated significantly improved caliber and flow through the internal carotid artery and distally. A control arteriogram was then performed centered  intracranially which confirmed the presence of occluded superior division of the right middle cerebral  artery. Over the exchange Transend EX micro guidewire, a Trevo ProVue 021 microcatheter was advanced and positioned in the proximal cavernous segment. The guidewire was removed. Good aspiration obtained from the hub of the microcatheter. Through this, a 0.014 inch Softip Synchro micro guidewire with a J tip configuration was advanced with the microcatheter to the supraclinoid right ICA. Using a torque device the micro guidewire was then gently advanced into the origin of the superior division. The micro micro guidewire was then gently advanced without difficulty into the M2 M3 region of the superior division followed by the microcatheter. At this time the [facility] guide catheter was advanced into the origin of the right internal carotid artery. Good aspiration obtained from the hub of the microcatheter. A gentle contrast injection demonstrated safe position of the tip of the microcatheter with slow antegrade clearance of contrast. A 4 mm x 40 mm Solitaire FR retrieval device was then advanced in a coaxial manner and with constant heparinized saline infusion using biplane roadmap technique and constant fluoroscopic guidance to the distal end of the microcatheter. The O ring on the delivery microcatheter was then gently loosened. There was slight forward gentle traction with the right hand on the delivery micro guidewire, with the left hand the delivery microcatheter was retrieved with the tip just proximal to the proximal portion of the opened retrieval device. A control arteriogram performed through the 8 [place] FlowGate guide catheter demonstrated only partial contrast advancement through the stented segment of the superior division. The balloon was then inflated in the origin of the right internal carotid artery for proximal flow arrest. The proximal portion of the retrieval device was then captured into the microcatheter. Thereafter using constant aspiration with a 60 mL syringe at the hub of the [facility] guide  catheter, the combination of the microcatheter and the retrieval device was then gently retrieved and removed. Chunks of clots were noted trapped within the interstices of the retrieval device and also in the hub of the [employer] [place]. Aspiration was continued as the proximal flow arrest was released. Free back bleed of blood was noted at the hub of the 8 [place] FlowGate guide catheter. A control arteriogram performed through the 8 [place] FlowGate guide catheter demonstrated antegrade flow to the right internal carotid supraclinoid segments. Free flow was now noted through the superior division and inferior division of the right middle cerebral artery. The right anterior cerebral artery remained patent. A TICI 2b reperfusion had been established. There was no extravasation of contrast or of mass-effect on the major vessels intracranially. The rapid transit 2 tip microcatheter was then again advanced to the distal end of the 8 [place] FlowGate guide catheter just proximal to the origin of the right internal carotid artery with a 0.014 inch Softip Synchro micro guidewire. The micro guidewire was then gently manipulated through the now angioplastied segment of the right internal carotid artery followed by the microcatheter. The combination was advanced to the horizontal petrous segment. The guidewire was removed. Good aspiration obtained from the hub of the microcatheter. This in turn was then exchanged for a 300 cm Transend Softip EX exchange micro guidewire under constant fluoroscopic guidance. The exchange guidewire had a J-tip configuration in the petrous segment. A control arteriogram performed through the 8 [place] FlowGate guide catheter continued to demonstrate excellent flow through the right internal carotid artery intracranially and proximally performed of the right common carotid artery and  right internal carotid artery in their normal segments. It was elected to use a 6 mm - 8 mm x 40 mm Xact stent system. The  stent carrying catheter was retrogradely flushed with heparinized saline infusion. Again using the rapid exchange technique, this was advanced under constant fluoroscopic guidance and positioned with the distal and the proximal markers of the stent optimally in relation to the angioplastied segment. This stent was then deployed under constant fluoroscopic guidance without any difficulty. The delivery catheter was then retrieved and removed with the exchange guidewire being maintained in the petrous segment. A control arteriogram performed through 8 [place] FlowGate guide catheter demonstrated excellent apposition proximally and distally with excellent flow noted through the stented segment. A 5 mm x 30 mm Viatrac 14 angioplasty balloon was then prepped and purged with heparinized saline infusion. Again using the rapid exchange technique this was advanced with the distal and proximal markers positioned optimally in relation to the waist on the stented segment. A slow control inflation was then performed of the angioplasty catheter to 10 atmospheres achieving a diameter of 5.14 mm. This was maintained for approximately 20 seconds. The balloon was then gently deflated and retrieved and removed. A control arteriogram performed through the 8 [place] FlowGate guide catheter demonstrated excellent flow and apposition. No evidence of dissection was noted distal to the angioplastied segment of the right internal carotid artery. There was now free flow noted into the right middle cerebral artery and right anterior cerebral artery. The superior division remained widely open with a small focal area of hypoperfusion involving the posterior perisylvian branches. However, delayed retrograde opacification via the inferior division collaterals were noted. Mild caliber irregularity within the stented segment was noted on the 10 minute control arteriogram. This prompted the use of 1.8 mg of selective intra-arterial Integrilin into this  stent. Control arteriograms were then performed at 20, 30 and 40 minutes post second angioplasty. These continued to demonstrate excellent flow through the stented segment of the right internal carotid artery and intracranially. The exchange micro guidewire was then gently retrieved and removed after the 20 minute control arteriogram. The 8 [place] FlowGate guide catheter and the 8 [place] Brite tip neurovascular sheath were then retrieved into the abdominal aorta and exchanged over a J-tip guidewire for a 9 [place] Pinnacle sheath. This was then connected to continuous heparinized saline infusion. The right groin appeared soft without evidence of a hematoma or bleeding. The distal pulses in both feet remained 1+ after the procedure unchanged compared to prior to the procedure. The patient was also loaded with 650 mg of aspirin and 300 mg of Plavix via an orogastric tube prior to performing the right internal carotid artery angioplasty. Also the patient was given approximately 2000 units of IV heparin at the beginning of the procedure. Patient was then transported to the CT scanner for postprocedural CT scan of the brain. 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 1.5 mg of a selective intra-arterial Integrilin utilized within the segment of the right internal carotid artery. PLAN: Postprocedural CT scan of the brain prior to being transferred to the neuro ICU. Electronically Signed   By: Julieanne Cotton M.D.   On: 11/26/2016 21:18   Ir  Angio Intra Extracran Sel Com Carotid Innominate Uni L Mod Sed  Result Date: 11/27/2016 INDICATION: Left sided weaknee and  severe dysarthria EXAM: 1. EMERGENT LARGE VESSEL OCCLUSION THROMBOLYSIS (anterior CIRCULATION) 2. 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 COMPARISON:  CT angiogram of 11/25/2016, and CT perfusion scan of 11/25/2016. MEDICATIONS: Ancef 2 g IV antibiotic was administered within 1 hour of the procedure. ANESTHESIA/SEDATION: General anesthesia. CONTRAST:  Isovue 300 approximately 120 CC. FLUOROSCOPY TIME:  Fluoroscopy Time: 51 minutes 48 seconds (2968 mGy). COMPLICATIONS: None immediate. TECHNIQUE: Following a full explanation of the procedure along with the potential associated complications, an informed witnessed consent was obtained from the patient and the patient's wide. The risks of intracranial hemorrhage of 10%, worsening neurological deficit, ventilator dependency, death and inability to revascularize were all reviewed in detail with the patient'swife. The patient was then put under general anesthesia by the Department of Anesthesiology at [facility]. The right groin was prepped and draped in the usual sterile fashion. Thereafter using modified Seldinger technique, transfemoral access into the right common femoral artery was obtained without difficulty. Over a 0.035 inch guidewire a 5 French Pinnacle sheath was inserted. Through this, and also over a 0.035 inch guidewire a 5 [place] JB 1 catheter was advanced to the aortic arch region and selectively positioned in the right subclavian artery and the left common carotid artery, the left vertebral artery and the right common carotid artery. FINDINGS: The right subclavian arteriogram demonstrates the origin the right vertebral artery to be normal. The vessel is seen to opacify to the cranial skull base and opacifies the right vertebrobasilar junction and right posterior-inferior cerebellar artery. The opacified portion of the basilar artery and the right posterior cerebral artery  appears grossly intact on the lateral images. The right common carotid arteriogram demonstrates extensive plaque extending from the distal right common carotid artery anterior wall advancing into the proximal right external carotid artery. The external carotid artery origin is mildly narrowed. Its branches are normally opacified. The right internal carotid artery just proximal to the bulb demonstrates a severe string-like pre occlusive stenosis. There is poststenotic dilatation of the bulb itself. The delayed images demonstrate slow ascent of contrast to the cranial skull base in the right internal carotid artery. The petrous, cavernous and supraclinoid segments demonstrate wide patency. Opacification of the right middle and right anterior cerebral artery is noted with poor visualization of the superior division of the right middle cerebral artery. Non-opacified blood is seen in the right middle cerebral artery proximally and the right anterior cerebral artery A1 segment probably from the posterior circulation via the posterior communicating artery. The left vertebral artery origin is from the aortic arch between the origins of the left common carotid artery and the left subclavian arteries. The vessel is seen to opacify to the cranial skull base. Wide patency is seen of the left vertebrobasilar junction and the left posterior-inferior cerebellar artery. The basilar artery, the superior cerebellar arteries and the anterior-inferior cerebellar arteries are seen to opacify into the capillary and venous phases. Opacification of the right posterior cerebellar P1 segment is noted. There is prompt opacification via the right posterior communicating artery of the right middle cerebral artery distribution right M1 segment and transiently the right anterior cerebral artery A1 segment. The delayed arterial phase again demonstrates a large area of hypoperfusion to absent perfusion involving the entire perisylvian triangle.  There is poor attempt at retrograde opacification of the anterior 2/3 of  the perisylvian branches from the pericallosal and callosal marginal branches. The inferior division appears to be widely patent with opacification to the cortical subcortical areas. The left common carotid arteriogram demonstrates the left external carotid artery and its branches to be widely patent. The left internal carotid artery at the bulb to the cranial skull base opacifies normally. The petrous, the cavernous and the supraclinoid segments demonstrate wide patency as well. A left posterior communicating artery is seen opacifying the left posterior cerebral artery distribution and transiently the left superior cerebellar artery distribution. The left middle cerebral artery and the left anterior cerebral artery opacify normally into the capillary and venous phases. Transient cross opacification via the anterior communicating artery of the right anterior cerebral artery A2 segment and distally is noted. PROCEDURE: ENDOVASCULAR REVASCULARIZATION OF PRE OCCLUSIVE RIGHT INTERNAL CAROTID ARTERY AT THE BULB, WITH REVASCULARIZATION OF OCCLUDED SUPERIOR DIVISION OF THE RIGHT MIDDLE CEREBRAL ARTERY WITH MECHANICAL THROMBECTOMY USING 1 PASS WITH THE SOLITAIRE FR 4 MM X 40 MM RETRIEVAL DEVICE The diagnostic JB 1 catheter in the right common carotid artery was exchanged over a 0.035 inch 300 cm Rosen exchange guidewire for an 8 [place] 55 cm Brite tip neurovascular sheath using biplane roadmap technique and constant fluoroscopic guidance. Good aspiration was obtained from the side port of the neurovascular sheath. This was then connected to continuous heparinized saline infusion. Over the [employer] guidewire, an 8 [place] 85 cm FlowGate balloon guide catheter which had been prepped with 50% contrast and 50% heparinized saline infusion was advanced and positioned just proximal to the right common carotid bifurcation. The guidewire was removed. Good  aspiration obtained from the hub of the 8 [place] FlowGate guide catheter. A gentle contrast injection demonstrated no evidence of spasms, dissections or intraluminal filling defects. A rapid transit 2 tip microcatheter was then advanced over a 0.0141 Softtip Synchro micro guidewire to the distal end of the [facility] guide catheter. Again using biplane roadmap technique and constant fluoroscopic guidance, in a coaxial manner and with constant heparinized saline infusion, the combination of the micro guidewire and the microcatheter advanced into the origin of the right internal carotid artery. The micro guidewire with the J-tip configuration to avoid dissections or inducing spasm was then manipulated using a torque device passed the string sign stenoses without difficulty and advanced in combination with the microcatheter to the horizontal petrous segment of the right internal carotid artery. The guidewire was removed. Good aspiration obtained from the hub of the rapid transit microcatheter. This was then exchanged out for a 300 cm 0.014 inch Softtip Transend exchange micro guidewire using biplane roadmap technique and constant fluoroscopic guidance. The exchange wire tip had a J configuration to avoid dissections or inducing spasm. A control arteriogram performed through the [facility] guide catheter in the right common carotid artery demonstrated no change in the high-grade stenosis with flow noted distally into the right internal carotid artery skull base. At this time, a 4 mm x 30 mm Viatrac 14 angioplasty balloon was prepped and purged retrogradely with heparinized saline infusion. Using the rapid exchange technique, this was then advanced and positioned such that the distal and the proximal markers were adequate distant from the site of the severe focal stenosis. A slow control inflation was then performed using a micro inflation syringe device via micro tubing to 10 atmospheres achieving a diameter of 4.08 mm  where it was maintained for approximately 20 seconds. The balloon was then gently deflated and retrieved proximally and removed. A control arteriogram  performed through the 8 [place] FlowGate guide catheter demonstrated significantly improved caliber and flow through the internal carotid artery and distally. 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 Over the exchange Transend EX micro guidewire, a Trevo ProVue 021 microcatheter was advanced and positioned in the proximal cavernous segment. The guidewire was removed. Good aspiration obtained from the hub of the microcatheter. Through this, a 0.014 inch Softip Synchro micro guidewire with a J tip configuration was advanced with the microcatheter to the supraclinoid right ICA. Using a torque device the micro guidewire was then gently advanced into the origin of the superior division. The micro micro guidewire was then gently advanced without difficulty into the M2 M3 region of the superior division followed by the microcatheter. At this time the [facility] guide catheter was advanced into the origin of the right internal carotid artery. Good aspiration obtained from the hub of the microcatheter. A gentle contrast injection demonstrated safe position of the tip of the microcatheter with slow antegrade clearance of contrast. A 4 mm x 40 mm Solitaire FR retrieval device was then advanced in a coaxial manner and with constant heparinized saline infusion using biplane roadmap technique and constant fluoroscopic guidance to the distal end of the microcatheter. The O ring on the delivery microcatheter was then gently loosened. There was slight forward gentle traction with the right hand on the delivery micro guidewire, with the left hand the delivery microcatheter was retrieved with the tip just proximal to the proximal portion of the opened retrieval device. A  control arteriogram performed through the 8 [place] FlowGate guide catheter demonstrated only partial contrast advancement through the stented segment of the superior division. The balloon was then inflated in the origin of the right internal carotid artery for proximal flow arrest. The proximal portion of the retrieval device was then captured into the microcatheter. Thereafter using constant aspiration with a 60 mL syringe at the hub of the [facility] guide catheter, the combination of the microcatheter and the retrieval device was then gently retrieved and removed. Chunks of clots were noted trapped within the interstices of the retrieval device and also in the hub of the [employer] [place]. Aspiration was continued as the proximal flow arrest was released. Free back bleed of blood was noted at the hub of the 8 [place] FlowGate guide catheter. A control arteriogram performed through the 8 [place] FlowGate guide catheter demonstrated antegrade flow to the right internal carotid supraclinoid segments. Free flow was now noted through the superior division and inferior division of the right middle cerebral artery. The right anterior cerebral artery remained patent. A TICI 2b reperfusion had been established. There was no extravasation of contrast or of mass-effect on the major vessels intracranially. The rapid transit 2 tip microcatheter was then again advanced to the distal end of the 8 [place] FlowGate guide catheter just proximal to the origin of the right internal carotid artery with a 0.014 inch Softip Synchro micro guidewire. The micro guidewire was then gently manipulated through the now angioplastied segment of the right internal carotid artery followed by the microcatheter. The combination was advanced to the horizontal petrous segment. The guidewire was removed. Good aspiration obtained from the hub of the microcatheter. This in turn was then exchanged for a 300 cm Transend Softip EX exchange micro guidewire under  constant fluoroscopic guidance. The exchange guidewire had a J-tip configuration in the petrous segment. A control arteriogram performed through  the 8 [place] FlowGate guide catheter continued to demonstrate excellent flow through the right internal carotid artery intracranially and proximally performed of the right common carotid artery and right internal carotid artery in their normal segments. It was elected to use a 6 mm - 8 mm x 40 mm Xact stent system. The stent carrying catheter was retrogradely flushed with heparinized saline infusion. Again using the rapid exchange technique, this was advanced under constant fluoroscopic guidance and positioned with the distal and the proximal markers of the stent optimally in relation to the angioplastied segment. This stent was then deployed under constant fluoroscopic guidance without any difficulty. The delivery catheter was then retrieved and removed with the exchange guidewire being maintained in the petrous segment. A control arteriogram performed through 8 [place] FlowGate guide catheter demonstrated excellent apposition proximally and distally with excellent flow noted through the stented segment. A 5 mm x 30 mm Viatrac 14 angioplasty balloon was then prepped and purged with heparinized saline infusion. Again using the rapid exchange technique this was advanced with the distal and proximal markers positioned optimally in relation to the waist on the stented segment. A slow control inflation was then performed of the angioplasty catheter to 10 atmospheres achieving a diameter of 5.14 mm. This was maintained for approximately 20 seconds. The balloon was then gently deflated and retrieved and removed. A control arteriogram performed through the 8 [place] FlowGate guide catheter demonstrated excellent flow and apposition. No evidence of dissection was noted distal to the angioplastied segment of the right internal carotid artery. There was now free flow noted into the  right middle cerebral artery and right anterior cerebral artery. The superior division remained widely open with a small focal area of hypoperfusion involving the posterior perisylvian branches. However, delayed retrograde opacification via the inferior division collaterals were noted. Mild caliber irregularity within the stented segment was noted on the 10 minute control arteriogram. This prompted the use of 1.8 mg of selective intra-arterial Integrilin into this stent. Control arteriograms were then performed at 20, 30 and 40 minutes post second angioplasty. These continued to demonstrate excellent flow through the stented segment of the right internal carotid artery and intracranially. The exchange micro guidewire was then gently retrieved and removed after the 20 minute control arteriogram. The 8 French FlowGate guide catheter and the 8 [place] Brite tip neurovascular sheath were then retrieved into the abdominal aorta and exchanged over a J-tip guidewire for a 9 [place] Pinnacle sheath. This was then connected to continuous heparinized saline infusion. The right groin appeared soft without evidence of a hematoma or bleeding. The distal pulses in both feet remained 1+ after the procedure unchanged compared to prior to the procedure. The patient was also loaded with 650 mg of aspirin and 300 mg of Plavix via an orogastric tube prior to performing the right internal carotid artery angioplasty. Also the patient was given approximately 2000 units of IV heparin at the beginning of the procedure. Patient was then transported to the CT scanner for postprocedural CT scan of the brain. 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 1.5 mg of a selective intra-arterial Integrilin utilized within the segment of the right internal carotid artery. PLAN: Postprocedural CT scan  of the brain prior to being transferred to the neuro ICU. Electronically Signed   By: Julieanne Cotton M.D.   On: 11/26/2016 21:18   Ir Angio Vertebral Sel Subclavian Innominate Uni R Mod Sed  Result Date: 11/27/2016 INDICATION: Left sided weaknee and severe dysarthria EXAM: 1. EMERGENT LARGE VESSEL OCCLUSION THROMBOLYSIS (anterior CIRCULATION) 2. 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 COMPARISON:  CT angiogram of 11/25/2016, and CT perfusion scan of 11/25/2016. MEDICATIONS: Ancef 2 g IV antibiotic was administered within 1 hour of the procedure. ANESTHESIA/SEDATION: General anesthesia. CONTRAST:  Isovue 300 approximately 120 CC. FLUOROSCOPY TIME:  Fluoroscopy Time: 51 minutes 48 seconds (2968 mGy). COMPLICATIONS: None immediate. TECHNIQUE: Following a full explanation of the procedure along with the potential associated complications, an informed witnessed consent was obtained from the patient and the patient's wide. The risks of intracranial hemorrhage of 10%, worsening neurological deficit, ventilator dependency, death and inability to revascularize were all reviewed in detail with the patient'swife. The patient was then put under general anesthesia by the Department of Anesthesiology at [facility]. The right groin was prepped and draped in the usual sterile fashion. Thereafter using modified Seldinger technique, transfemoral access into the right common femoral artery was obtained without difficulty. Over a 0.035 inch guidewire a 5 French Pinnacle sheath was inserted. Through this, and also over a 0.035 inch guidewire a 5 [place] JB 1 catheter was advanced to the aortic arch region and selectively positioned in the right  subclavian artery and the left common carotid artery, the left vertebral artery and the right common carotid artery. FINDINGS: The right subclavian arteriogram demonstrates the origin the right vertebral artery to be normal. The vessel is seen to opacify to the cranial skull base and opacifies the right vertebrobasilar junction and right posterior-inferior cerebellar artery. The opacified portion of the basilar artery and the right posterior cerebral artery appears grossly intact on the lateral images. The right common carotid arteriogram demonstrates extensive plaque extending from the distal right common carotid artery anterior wall advancing into the proximal right external carotid artery. The external carotid artery origin is mildly narrowed. Its branches are normally opacified. The right internal carotid artery just proximal to the bulb demonstrates a severe string-like pre occlusive stenosis. There is poststenotic dilatation of the bulb itself. The delayed images demonstrate slow ascent of contrast to the cranial skull base in the right internal carotid artery. The petrous, cavernous and supraclinoid segments demonstrate wide patency. Opacification of the right middle and right anterior cerebral artery is noted with poor visualization of the superior division of the right middle cerebral artery. Non-opacified blood is seen in the right middle cerebral artery proximally and the right anterior cerebral artery A1 segment probably from the posterior circulation via the posterior communicating artery. The left vertebral artery origin is from the aortic arch between the origins of the left common carotid artery and the left subclavian arteries. The vessel is seen to opacify to the cranial skull base. Wide patency is seen of the left vertebrobasilar junction and the left posterior-inferior cerebellar artery. The basilar artery, the superior cerebellar arteries and the anterior-inferior cerebellar arteries are seen to  opacify into the capillary and venous phases. Opacification of the right posterior cerebellar P1 segment is noted. There is prompt opacification via the right posterior communicating artery of the right middle cerebral artery distribution right M1 segment and transiently the right anterior cerebral artery A1 segment. The delayed arterial phase again demonstrates  a large area of hypoperfusion to absent perfusion involving the entire perisylvian triangle. There is poor attempt at retrograde opacification of the anterior 2/3 of the perisylvian branches from the pericallosal and callosal marginal branches. The inferior division appears to be widely patent with opacification to the cortical subcortical areas. The left common carotid arteriogram demonstrates the left external carotid artery and its branches to be widely patent. The left internal carotid artery at the bulb to the cranial skull base opacifies normally. The petrous, the cavernous and the supraclinoid segments demonstrate wide patency as well. A left posterior communicating artery is seen opacifying the left posterior cerebral artery distribution and transiently the left superior cerebellar artery distribution. The left middle cerebral artery and the left anterior cerebral artery opacify normally into the capillary and venous phases. Transient cross opacification via the anterior communicating artery of the right anterior cerebral artery A2 segment and distally is noted. PROCEDURE: ENDOVASCULAR REVASCULARIZATION OF PRE OCCLUSIVE RIGHT INTERNAL CAROTID ARTERY AT THE BULB, WITH REVASCULARIZATION OF OCCLUDED SUPERIOR DIVISION OF THE RIGHT MIDDLE CEREBRAL ARTERY WITH MECHANICAL THROMBECTOMY USING 1 PASS WITH THE SOLITAIRE FR 4 MM X 40 MM RETRIEVAL DEVICE The diagnostic JB 1 catheter in the right common carotid artery was exchanged over a 0.035 inch 300 cm Rosen exchange guidewire for an 8 French 55 cm Brite tip neurovascular sheath using biplane roadmap technique  and constant fluoroscopic guidance. Good aspiration was obtained from the side port of the neurovascular sheath. This was then connected to continuous heparinized saline infusion. Over the [employer] guidewire, an 8 [place] 85 cm FlowGate balloon guide catheter which had been prepped with 50% contrast and 50% heparinized saline infusion was advanced and positioned just proximal to the right common carotid bifurcation. The guidewire was removed. Good aspiration obtained from the hub of the 8 [place] FlowGate guide catheter. A gentle contrast injection demonstrated no evidence of spasms, dissections or intraluminal filling defects. A rapid transit 2 tip microcatheter was then advanced over a 0.0141 Softtip Synchro micro guidewire to the distal end of the [facility] guide catheter. Again using biplane roadmap technique and constant fluoroscopic guidance, in a coaxial manner and with constant heparinized saline infusion, the combination of the micro guidewire and the microcatheter advanced into the origin of the right internal carotid artery. The micro guidewire with the J-tip configuration to avoid dissections or inducing spasm was then manipulated using a torque device passed the string sign stenoses without difficulty and advanced in combination with the microcatheter to the horizontal petrous segment of the right internal carotid artery. The guidewire was removed. Good aspiration obtained from the hub of the rapid transit microcatheter. This was then exchanged out for a 300 cm 0.014 inch Softtip Transend exchange micro guidewire using biplane roadmap technique and constant fluoroscopic guidance. The exchange wire tip had a J configuration to avoid dissections or inducing spasm. A control arteriogram performed through the [facility] guide catheter in the right common carotid artery demonstrated no change in the high-grade stenosis with flow noted distally into the right internal carotid artery skull base. At this time,  a 4 mm x 30 mm Viatrac 14 angioplasty balloon was prepped and purged retrogradely with heparinized saline infusion. Using the rapid exchange technique, this was then advanced and positioned such that the distal and the proximal markers were adequate distant from the site of the severe focal stenosis. A slow control inflation was then performed using a micro inflation syringe device via micro tubing to 10 atmospheres achieving a diameter  of 4.08 mm where it was maintained for approximately 20 seconds. The balloon was then gently deflated and retrieved proximally and removed. A control arteriogram performed through the 8 [place] FlowGate guide catheter demonstrated significantly improved caliber and flow through the internal carotid artery and distally. 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 Over the exchange Transend EX micro guidewire, a Trevo ProVue 021 microcatheter was advanced and positioned in the proximal cavernous segment. The guidewire was removed. Good aspiration obtained from the hub of the microcatheter. Through this, a 0.014 inch Softip Synchro micro guidewire with a J tip configuration was advanced with the microcatheter to the supraclinoid right ICA. Using a torque device the micro guidewire was then gently advanced into the origin of the superior division. The micro micro guidewire was then gently advanced without difficulty into the M2 M3 region of the superior division followed by the microcatheter. At this time the [facility] guide catheter was advanced into the origin of the right internal carotid artery. Good aspiration obtained from the hub of the microcatheter. A gentle contrast injection demonstrated safe position of the tip of the microcatheter with slow antegrade clearance of contrast. A 4 mm x 40 mm Solitaire FR retrieval device was then advanced in a coaxial manner and  with constant heparinized saline infusion using biplane roadmap technique and constant fluoroscopic guidance to the distal end of the microcatheter. The O ring on the delivery microcatheter was then gently loosened. There was slight forward gentle traction with the right hand on the delivery micro guidewire, with the left hand the delivery microcatheter was retrieved with the tip just proximal to the proximal portion of the opened retrieval device. A control arteriogram performed through the 8 [place] FlowGate guide catheter demonstrated only partial contrast advancement through the stented segment of the superior division. The balloon was then inflated in the origin of the right internal carotid artery for proximal flow arrest. The proximal portion of the retrieval device was then captured into the microcatheter. Thereafter using constant aspiration with a 60 mL syringe at the hub of the [facility] guide catheter, the combination of the microcatheter and the retrieval device was then gently retrieved and removed. Chunks of clots were noted trapped within the interstices of the retrieval device and also in the hub of the [employer] [place]. Aspiration was continued as the proximal flow arrest was released. Free back bleed of blood was noted at the hub of the 8 [place] FlowGate guide catheter. A control arteriogram performed through the 8 [place] FlowGate guide catheter demonstrated antegrade flow to the right internal carotid supraclinoid segments. Free flow was now noted through the superior division and inferior division of the right middle cerebral artery. The right anterior cerebral artery remained patent. A TICI 2b reperfusion had been established. There was no extravasation of contrast or of mass-effect on the major vessels intracranially. The rapid transit 2 tip microcatheter was then again advanced to the distal end of the 8 [place] FlowGate guide catheter just proximal to the origin of the right internal carotid artery  with a 0.014 inch Softip Synchro micro guidewire. The micro guidewire was then gently manipulated through the now angioplastied segment of the right internal carotid artery followed by the microcatheter. The combination was advanced to the horizontal petrous segment. The guidewire was removed. Good aspiration obtained from the hub of the microcatheter. This in turn was then exchanged for a 300 cm Transend  Softip EX exchange micro guidewire under constant fluoroscopic guidance. The exchange guidewire had a J-tip configuration in the petrous segment. A control arteriogram performed through the 8 [place] FlowGate guide catheter continued to demonstrate excellent flow through the right internal carotid artery intracranially and proximally performed of the right common carotid artery and right internal carotid artery in their normal segments. It was elected to use a 6 mm - 8 mm x 40 mm Xact stent system. The stent carrying catheter was retrogradely flushed with heparinized saline infusion. Again using the rapid exchange technique, this was advanced under constant fluoroscopic guidance and positioned with the distal and the proximal markers of the stent optimally in relation to the angioplastied segment. This stent was then deployed under constant fluoroscopic guidance without any difficulty. The delivery catheter was then retrieved and removed with the exchange guidewire being maintained in the petrous segment. A control arteriogram performed through 8 [place] FlowGate guide catheter demonstrated excellent apposition proximally and distally with excellent flow noted through the stented segment. A 5 mm x 30 mm Viatrac 14 angioplasty balloon was then prepped and purged with heparinized saline infusion. Again using the rapid exchange technique this was advanced with the distal and proximal markers positioned optimally in relation to the waist on the stented segment. A slow control inflation was then performed of the  angioplasty catheter to 10 atmospheres achieving a diameter of 5.14 mm. This was maintained for approximately 20 seconds. The balloon was then gently deflated and retrieved and removed. A control arteriogram performed through the 8 [place] FlowGate guide catheter demonstrated excellent flow and apposition. No evidence of dissection was noted distal to the angioplastied segment of the right internal carotid artery. There was now free flow noted into the right middle cerebral artery and right anterior cerebral artery. The superior division remained widely open with a small focal area of hypoperfusion involving the posterior perisylvian branches. However, delayed retrograde opacification via the inferior division collaterals were noted. Mild caliber irregularity within the stented segment was noted on the 10 minute control arteriogram. This prompted the use of 1.8 mg of selective intra-arterial Integrilin into this stent. Control arteriograms were then performed at 20, 30 and 40 minutes post second angioplasty. These continued to demonstrate excellent flow through the stented segment of the right internal carotid artery and intracranially. The exchange micro guidewire was then gently retrieved and removed after the 20 minute control arteriogram. The 8 French FlowGate guide catheter and the 8 [place] Brite tip neurovascular sheath were then retrieved into the abdominal aorta and exchanged over a J-tip guidewire for a 9 [place] Pinnacle sheath. This was then connected to continuous heparinized saline infusion. The right groin appeared soft without evidence of a hematoma or bleeding. The distal pulses in both feet remained 1+ after the procedure unchanged compared to prior to the procedure. The patient was also loaded with 650 mg of aspirin and 300 mg of Plavix via an orogastric tube prior to performing the right internal carotid artery angioplasty. Also the patient was given approximately 2000 units of IV heparin at the  beginning of the procedure. Patient was then transported to the CT scanner for postprocedural CT scan of the brain. 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 Post endovascular revascularization of symptomatic pre occlusive string-like stenoses of the right internal carotid artery proximal to the bulb  with stent assisted angioplasty. 1.5 mg of a selective intra-arterial Integrilin utilized within the segment of the right internal carotid artery. PLAN: Postprocedural CT scan of the brain prior to being transferred to the neuro ICU. Electronically Signed   By: Julieanne Cotton M.D.   On: 11/26/2016 21:18   Ir Angio Vertebral Sel Vertebral Uni L Mod Sed  Result Date: 11/27/2016 INDICATION: Left sided weaknee and severe dysarthria EXAM: 1. EMERGENT LARGE VESSEL OCCLUSION THROMBOLYSIS (anterior CIRCULATION) 2. 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 COMPARISON:  CT angiogram of 11/25/2016, and CT perfusion scan of 11/25/2016. MEDICATIONS: Ancef 2 g IV antibiotic was administered within 1 hour of the procedure. ANESTHESIA/SEDATION: General anesthesia. CONTRAST:  Isovue 300 approximately 120 CC. FLUOROSCOPY TIME:  Fluoroscopy Time: 51 minutes 48 seconds (2968 mGy). COMPLICATIONS: None immediate. TECHNIQUE: Following a full explanation of the procedure along with the potential associated complications, an informed witnessed consent was obtained from the patient and the patient's wide. The risks of intracranial hemorrhage of 10%, worsening neurological deficit, ventilator dependency, death and inability to revascularize were all reviewed in detail with the patient'swife. The patient was then put under general anesthesia by the Department of Anesthesiology at [facility]. The right groin was prepped and draped in the usual sterile fashion. Thereafter using modified Seldinger technique, transfemoral access into the right common femoral artery was obtained without difficulty. Over a 0.035 inch guidewire a 5 French Pinnacle sheath was inserted. Through this, and also over a 0.035 inch guidewire a 5 [place] JB 1 catheter was advanced to the aortic arch region and selectively positioned in the right subclavian artery and the left common carotid artery, the left vertebral artery and the right common carotid artery. FINDINGS: The right subclavian arteriogram demonstrates the origin the right vertebral artery to be normal. The vessel is seen to opacify to the cranial skull base and opacifies the right vertebrobasilar junction and right posterior-inferior cerebellar artery. The opacified portion of the basilar artery and the right posterior cerebral artery appears grossly intact on the lateral images. The right common carotid arteriogram demonstrates extensive plaque extending from the distal right common carotid artery anterior wall advancing into the proximal right external carotid artery. The external carotid artery origin is mildly narrowed. Its branches are normally opacified. The right internal carotid artery just proximal to the bulb demonstrates a severe string-like pre occlusive stenosis. There is poststenotic dilatation of the bulb itself. The delayed images demonstrate slow ascent of contrast to the cranial skull base in the right internal carotid artery. The petrous, cavernous and supraclinoid segments demonstrate wide patency. Opacification of the right middle and right anterior cerebral artery is noted with poor visualization of the superior division of the right middle cerebral artery. Non-opacified blood is seen in the right middle cerebral artery proximally and the right anterior cerebral artery A1 segment probably from the posterior circulation via the posterior  communicating artery. The left vertebral artery origin is from the aortic arch between the origins of the left common carotid artery and the left subclavian arteries. The vessel is seen to opacify to the cranial skull base. Wide patency is seen of the left vertebrobasilar junction and the left posterior-inferior cerebellar artery. The basilar artery, the superior cerebellar arteries and the anterior-inferior cerebellar arteries are seen to opacify into the capillary and venous phases. Opacification of the right posterior cerebellar P1 segment is noted. There is prompt opacification via the right posterior communicating artery of  the right middle cerebral artery distribution right M1 segment and transiently the right anterior cerebral artery A1 segment. The delayed arterial phase again demonstrates a large area of hypoperfusion to absent perfusion involving the entire perisylvian triangle. There is poor attempt at retrograde opacification of the anterior 2/3 of the perisylvian branches from the pericallosal and callosal marginal branches. The inferior division appears to be widely patent with opacification to the cortical subcortical areas. The left common carotid arteriogram demonstrates the left external carotid artery and its branches to be widely patent. The left internal carotid artery at the bulb to the cranial skull base opacifies normally. The petrous, the cavernous and the supraclinoid segments demonstrate wide patency as well. A left posterior communicating artery is seen opacifying the left posterior cerebral artery distribution and transiently the left superior cerebellar artery distribution. The left middle cerebral artery and the left anterior cerebral artery opacify normally into the capillary and venous phases. Transient cross opacification via the anterior communicating artery of the right anterior cerebral artery A2 segment and distally is noted. PROCEDURE: ENDOVASCULAR REVASCULARIZATION OF PRE  OCCLUSIVE RIGHT INTERNAL CAROTID ARTERY AT THE BULB, WITH REVASCULARIZATION OF OCCLUDED SUPERIOR DIVISION OF THE RIGHT MIDDLE CEREBRAL ARTERY WITH MECHANICAL THROMBECTOMY USING 1 PASS WITH THE SOLITAIRE FR 4 MM X 40 MM RETRIEVAL DEVICE The diagnostic JB 1 catheter in the right common carotid artery was exchanged over a 0.035 inch 300 cm Rosen exchange guidewire for an 8 French 55 cm Brite tip neurovascular sheath using biplane roadmap technique and constant fluoroscopic guidance. Good aspiration was obtained from the side port of the neurovascular sheath. This was then connected to continuous heparinized saline infusion. Over the [employer] guidewire, an 8 [place] 85 cm FlowGate balloon guide catheter which had been prepped with 50% contrast and 50% heparinized saline infusion was advanced and positioned just proximal to the right common carotid bifurcation. The guidewire was removed. Good aspiration obtained from the hub of the 8 [place] FlowGate guide catheter. A gentle contrast injection demonstrated no evidence of spasms, dissections or intraluminal filling defects. A rapid transit 2 tip microcatheter was then advanced over a 0.0141 Softtip Synchro micro guidewire to the distal end of the [facility] guide catheter. Again using biplane roadmap technique and constant fluoroscopic guidance, in a coaxial manner and with constant heparinized saline infusion, the combination of the micro guidewire and the microcatheter advanced into the origin of the right internal carotid artery. The micro guidewire with the J-tip configuration to avoid dissections or inducing spasm was then manipulated using a torque device passed the string sign stenoses without difficulty and advanced in combination with the microcatheter to the horizontal petrous segment of the right internal carotid artery. The guidewire was removed. Good aspiration obtained from the hub of the rapid transit microcatheter. This was then exchanged out for a 300  cm 0.014 inch Softtip Transend exchange micro guidewire using biplane roadmap technique and constant fluoroscopic guidance. The exchange wire tip had a J configuration to avoid dissections or inducing spasm. A control arteriogram performed through the [facility] guide catheter in the right common carotid artery demonstrated no change in the high-grade stenosis with flow noted distally into the right internal carotid artery skull base. At this time, a 4 mm x 30 mm Viatrac 14 angioplasty balloon was prepped and purged retrogradely with heparinized saline infusion. Using the rapid exchange technique, this was then advanced and positioned such that the distal and the proximal markers were adequate distant from the site of the severe  focal stenosis. A slow control inflation was then performed using a micro inflation syringe device via micro tubing to 10 atmospheres achieving a diameter of 4.08 mm where it was maintained for approximately 20 seconds. The balloon was then gently deflated and retrieved proximally and removed. A control arteriogram performed through the 8 [place] FlowGate guide catheter demonstrated significantly improved caliber and flow through the internal carotid artery and distally. 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 Over the exchange Transend EX micro guidewire, a Trevo ProVue 021 microcatheter was advanced and positioned in the proximal cavernous segment. The guidewire was removed. Good aspiration obtained from the hub of the microcatheter. Through this, a 0.014 inch Softip Synchro micro guidewire with a J tip configuration was advanced with the microcatheter to the supraclinoid right ICA. Using a torque device the micro guidewire was then gently advanced into the origin of the superior division. The micro micro guidewire was then gently advanced without difficulty into the M2 M3  region of the superior division followed by the microcatheter. At this time the [facility] guide catheter was advanced into the origin of the right internal carotid artery. Good aspiration obtained from the hub of the microcatheter. A gentle contrast injection demonstrated safe position of the tip of the microcatheter with slow antegrade clearance of contrast. A 4 mm x 40 mm Solitaire FR retrieval device was then advanced in a coaxial manner and with constant heparinized saline infusion using biplane roadmap technique and constant fluoroscopic guidance to the distal end of the microcatheter. The O ring on the delivery microcatheter was then gently loosened. There was slight forward gentle traction with the right hand on the delivery micro guidewire, with the left hand the delivery microcatheter was retrieved with the tip just proximal to the proximal portion of the opened retrieval device. A control arteriogram performed through the 8 [place] FlowGate guide catheter demonstrated only partial contrast advancement through the stented segment of the superior division. The balloon was then inflated in the origin of the right internal carotid artery for proximal flow arrest. The proximal portion of the retrieval device was then captured into the microcatheter. Thereafter using constant aspiration with a 60 mL syringe at the hub of the [facility] guide catheter, the combination of the microcatheter and the retrieval device was then gently retrieved and removed. Chunks of clots were noted trapped within the interstices of the retrieval device and also in the hub of the [employer] [place]. Aspiration was continued as the proximal flow arrest was released. Free back bleed of blood was noted at the hub of the 8 [place] FlowGate guide catheter. A control arteriogram performed through the 8 [place] FlowGate guide catheter demonstrated antegrade flow to the right internal carotid supraclinoid segments. Free flow was now noted through the  superior division and inferior division of the right middle cerebral artery. The right anterior cerebral artery remained patent. A TICI 2b reperfusion had been established. There was no extravasation of contrast or of mass-effect on the major vessels intracranially. The rapid transit 2 tip microcatheter was then again advanced to the distal end of the 8 [place] FlowGate guide catheter just proximal to the origin of the right internal carotid artery with a 0.014 inch Softip Synchro micro guidewire. The micro guidewire was then gently manipulated through the now angioplastied segment of the right internal carotid artery followed by the microcatheter. The combination was advanced to the horizontal petrous segment.  The guidewire was removed. Good aspiration obtained from the hub of the microcatheter. This in turn was then exchanged for a 300 cm Transend Softip EX exchange micro guidewire under constant fluoroscopic guidance. The exchange guidewire had a J-tip configuration in the petrous segment. A control arteriogram performed through the 8 [place] FlowGate guide catheter continued to demonstrate excellent flow through the right internal carotid artery intracranially and proximally performed of the right common carotid artery and right internal carotid artery in their normal segments. It was elected to use a 6 mm - 8 mm x 40 mm Xact stent system. The stent carrying catheter was retrogradely flushed with heparinized saline infusion. Again using the rapid exchange technique, this was advanced under constant fluoroscopic guidance and positioned with the distal and the proximal markers of the stent optimally in relation to the angioplastied segment. This stent was then deployed under constant fluoroscopic guidance without any difficulty. The delivery catheter was then retrieved and removed with the exchange guidewire being maintained in the petrous segment. A control arteriogram performed through 8 [place] FlowGate guide  catheter demonstrated excellent apposition proximally and distally with excellent flow noted through the stented segment. A 5 mm x 30 mm Viatrac 14 angioplasty balloon was then prepped and purged with heparinized saline infusion. Again using the rapid exchange technique this was advanced with the distal and proximal markers positioned optimally in relation to the waist on the stented segment. A slow control inflation was then performed of the angioplasty catheter to 10 atmospheres achieving a diameter of 5.14 mm. This was maintained for approximately 20 seconds. The balloon was then gently deflated and retrieved and removed. A control arteriogram performed through the 8 [place] FlowGate guide catheter demonstrated excellent flow and apposition. No evidence of dissection was noted distal to the angioplastied segment of the right internal carotid artery. There was now free flow noted into the right middle cerebral artery and right anterior cerebral artery. The superior division remained widely open with a small focal area of hypoperfusion involving the posterior perisylvian branches. However, delayed retrograde opacification via the inferior division collaterals were noted. Mild caliber irregularity within the stented segment was noted on the 10 minute control arteriogram. This prompted the use of 1.8 mg of selective intra-arterial Integrilin into this stent. Control arteriograms were then performed at 20, 30 and 40 minutes post second angioplasty. These continued to demonstrate excellent flow through the stented segment of the right internal carotid artery and intracranially. The exchange micro guidewire was then gently retrieved and removed after the 20 minute control arteriogram. The 8 French FlowGate guide catheter and the 8 [place] Brite tip neurovascular sheath were then retrieved into the abdominal aorta and exchanged over a J-tip guidewire for a 9 [place] Pinnacle sheath. This was then connected to continuous  heparinized saline infusion. The right groin appeared soft without evidence of a hematoma or bleeding. The distal pulses in both feet remained 1+ after the procedure unchanged compared to prior to the procedure. The patient was also loaded with 650 mg of aspirin and 300 mg of Plavix via an orogastric tube prior to performing the right internal carotid artery angioplasty. Also the patient was given approximately 2000 units of IV heparin at the beginning of the procedure. Patient was then transported to the CT scanner for postprocedural CT scan of the brain. IMPRESSION: Status post endovascular complete revascularization of occluded superior division of the right middle cerebral artery with 1 pass with Solitaire FR 4 mm x 40 mm retrieval device  achieving a TICI 2b reperfusion. 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 1.5 mg of a selective intra-arterial Integrilin utilized within the segment of the right internal carotid artery. PLAN: Postprocedural CT scan of the brain prior to being transferred to the neuro ICU. Electronically Signed   By: Julieanne Cotton M.D.   On: 11/26/2016 21:18    Labs:  CBC:  Recent Labs  11/25/16 1347 11/26/16 0420 11/27/16 0425 11/28/16 0216  WBC 3.1* 4.5 4.9 4.2  HGB 15.4 13.9 13.2 12.6*  HCT 46.0 42.0 40.9 38.3*  PLT 188 185 166 154    COAGS:  Recent Labs  11/25/16 1347  INR 1.04  APTT 33    BMP:  Recent Labs  11/25/16 1347 11/26/16 0420 11/27/16 0425 11/28/16 0216  NA 136 139 140 138  K 4.1 3.9 3.9 3.6  CL 104 110 108 107  CO2 24 23 24 25   GLUCOSE 98 109* 112* 106*  BUN 14 8 6 8   CALCIUM 9.3 7.9* 7.2* 8.0*  CREATININE 1.01 0.99 1.05 1.05  GFRNONAA >60 >60 >60 >60  GFRAA >60 >60 >60 >60    LIVER FUNCTION TESTS:  Recent Labs  01/26/16 1405 11/25/16 1347  BILITOT 0.8 0.8  AST 18 25  ALT 20 23    ALKPHOS 52 52  PROT 7.2 7.6  ALBUMIN 4.2 4.4    Assessment and Plan: CVA, s/p Complete revascularization of occluded Sup division of RT MCA with x1 pass with 4mm x 40 mm solitaire FR retrieval device achieving a TICI 2b reperfusion. 2.Revascularization of symptomatic near complete occlusion Of Rt ICA proximally with stent assisted angioplasty   Patient has been transitioned to brilinta.  He will have a new P2Y12 checked today. Still with severe dysarthria PANDA tube in place for dysphagia as well Work with therapies, hopefully will go to rehab   Electronically Signed: Caylor Cerino E 11/28/2016, 9:24 AM   I spent a total of 15 Minutes at the the patient's bedside AND on the patient's hospital floor or unit, greater than 50% of which was counseling/coordinating care for CVA

## 2016-11-28 NOTE — Progress Notes (Signed)
STROKE TEAM PROGRESS NOTE   SUBJECTIVE (INTERVAL HISTORY) His nurse is at the bedside.  The patient tolerated extubation well y`day but has not been cleared to swallow by speech therapy yet.Blood pressure well controlled.    OBJECTIVE Temp:  [98 F (36.7 C)-99.9 F (37.7 C)] 99.8 F (37.7 C) (07/26 0800) Pulse Rate:  [54-95] 78 (07/26 1100) Cardiac Rhythm: Normal sinus rhythm (07/26 0400) Resp:  [13-22] 19 (07/26 1100) BP: (101-153)/(46-84) 124/64 (07/26 1100) SpO2:  [96 %-100 %] 99 % (07/26 1100)  CBC:   Recent Labs Lab 11/25/16 1347 11/26/16 0420 11/27/16 0425 11/28/16 0216  WBC 3.1* 4.5 4.9 4.2  NEUTROABS 1.7 2.9  --   --   HGB 15.4 13.9 13.2 12.6*  HCT 46.0 42.0 40.9 38.3*  MCV 90.6 91.3 92.5 93.6  PLT 188 185 166 154    Basic Metabolic Panel:   Recent Labs Lab 11/27/16 0425 11/28/16 0216  NA 140 138  K 3.9 3.6  CL 108 107  CO2 24 25  GLUCOSE 112* 106*  BUN 6 8  CREATININE 1.05 1.05  CALCIUM 7.2* 8.0*  MG 1.6* 2.2  PHOS 3.1 2.9    Lipid Panel:     Component Value Date/Time   CHOL 193 11/26/2016 0420   TRIG 197 (H) 11/26/2016 0420   HDL 57 11/26/2016 0420   CHOLHDL 3.4 11/26/2016 0420   VLDL 39 11/26/2016 0420   LDLCALC 97 11/26/2016 0420   HgbA1c:  Lab Results  Component Value Date   HGBA1C 5.4 11/26/2016   Urine Drug Screen: No results found for: LABOPIA, COCAINSCRNUR, LABBENZ, AMPHETMU, THCU, LABBARB  Alcohol Level No results found for: ETH  IMAGING  Ct Angio Head W Or Wo Contrast Ct Angio Neck W Or Wo Contrast 11/25/2016 IMPRESSION: 1. 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 Right ICA somewhat diffusely narrow and irregular distally to the skullbase. 2. Positive CTA for emergent large vessel occlusion with right M2 occlusion and evolving right frontal lobe infarct involving the right insula and  frontal operculum. Moderate surrounding penumbra as above. 3. Additional mild for age atherosclerotic changes above. No other high-grade or correctable stenosis.  Ct Head Wo Contrast  11/25/2016 IMPRESSION: 1. Serpiginous subarachnoid hyperdensity within the right sylvian fissure and overlying right frontal lobe, most consistent with contrast leakage from recent catheter directed arteriogram. Superimposed hemorrhage not entirely excluded. Attention at follow-up recommended. 2. Evolving acute right MCA territory infarct.  Ct Head Wo Contrast 11/25/2016 IMPRESSION: 1. 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 Hyperdense right middle cerebral artery indicates acute or subacute occlusion. 2. No associated hemorrhage, hematoma, mass effect or midline shift at this time.  Ct Cerebral Perfusion W Contrast  11/25/2016 IMPRESSION: 1. 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 Right ICA somewhat diffusely narrow and irregular distally to the skullbase. 2. Positive CTA for emergent large vessel occlusion with right M2 occlusion and evolving right frontal lobe infarct involving the right insula and frontal operculum. Moderate surrounding penumbra as above. 3. Additional mild for age atherosclerotic changes above. No other high-grade or correctable stenosis.  Portable Chest Xray 11/25/2016 IMPRESSION: 1. Endotracheal tube tip about 4.3 cm superior to carina 2. Patchy atelectasis or infiltrate at the left lung base 3. Upper normal mediastinal with, likely augmented by low lung  volume.  MRI Head 11/25/2016 IMPRESSION: 1. Right insula and frontal parietal junction acute infarction with volume of 39 cc. Several additional punctate foci of acute infarction are present scattered throughout the right frontal lobe, right parietal lobe, right occipital lobe, and  left superior cerebellar hemisphere. 2. Stable right sylvian and posterior frontal convexity extra-axial signal abnormality from extravasated contrast and/or subarachnoid emorrhage in comparison with prior CT given differences in technique.   2D Echo 11/26/2016 Study Conclusions - Left ventricle: The cavity size was normal. Systolic function was normal. The estimated ejection fraction was in the range of 60% to 65%. Wall motion was normal; there were no regional wall motion abnormalities. Left ventricular diastolic function  parameters were normal.  Carotid US 11/26/2016 Summary: - Findings consistent with 40 - 59 percent stenosis involving the   proximal right internal carotid artery. Unable to clearly visualize the distal end of the carotid stent, visualized portions appear patent. - Findings consistent with a 1-39 percent stenosis involving the left internal carotid artery with elevated systolic velocities. - Bilateral vertebral arteries appear patent with antegrade flow.  Elevated velocities noted bilaterally, left greater than right.     PHYSICAL EXAM Pleasant middle aged african [place] male not in distress   . Marland Kitchen. Afebrile. Head is nontraumatic. Neck is supple without bruit.    Cardiac exam no murmur or gallop. Lungs are clear to auscultation. Distal pulses are well felt.   Neurological Exam :  Awake alert and follows simple midline commands as well as moves all 4 extremities to command. Pupils 4 mm equal reactive. Fundi not visualized. Blinks to threat more on the right than the left. Mild left lower facial weakness. Tongue midline. Motor system exam antigravity good strength on the right.   left hemiplegia with left upper extremity 1/5 and left lower extremity 3/5 strength.. Deep tendon flexes are depressed on the left compared to the right. Right plantar downgoing left is upgoing. Gait not tested. ASSESSMENT/PLAN [name] is a 71 y.o. male with history of hepatitis C  treated with Harvoni, who presented with Left-sided weakness, slurred speech, left facial droop.  He did not receive IV t-PA due to arriving outside of the treatment window.   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  Carotid Doppler: 40-59% R ICA stenosis, 1-39% L ICA stenosis.  Bilateral antegrade VA flow.  2D Echo: EF 60-65%. No source of embolus  LDL 97  HgbA1c 5.4  SCDs for VTE prophylaxis Diet NPO time specified  No antithrombotic prior to admission, now on aspirin 325 mg daily and clopidogrel 75 mg daily  Patient counseled to be compliant with his antithrombotic medications  Ongoing aggressive stroke risk factor management  Therapy recommendations: pending   Disposition:   pending  Hypertension  Stable  Permissive hypertension (OK if < 220/120) but gradually normalize in 5-7 days  Long-term BP goal normotensive  Hyperlipidemia  Home meds: none  LDL 97, goal < 70  Start atorvastatin 40mg  PO daily  Continue  statin after discharge  Other Stroke Risk Factors  Advanced age  Obesity, Body mass index is 30.65 kg/m., recommend weight loss, diet and exercise as appropriate   Other Active Problems  None  Hospital day # 3  I have personally examined this patient, reviewed notes, independently viewed imaging studies, participated in medical decision making and plan of care.ROS  completed by me personally and pertinent positives fully documented  I have made any additions or clarifications directly to the above note. He presented with left hemiplegia secondary to embolic occlusion of the superior division of the right middle cerebral artery from high-grade proximal right ICA stenosis. He underwent emergent angioplasty and stenting of the proximal right ICA stenosis with mechanical embolectomy of the occluded superior division of the right MCA. Recommend mobilize out of bed, physical occupational speech therapy consults. Rehabilitation consult. Transfer to the floor later today if bed available..  .  Continue aspirin and Plavix for carotid stent aspirin neuro interventional team recommendations. Family not available at the bedside for discussion. Discussed with Dr. Molli Knock and Dr. Corliss Skains. This patient is critically ill and at significant risk of neurological worsening, death and care requires constant monitoring of vital signs, hemodynamics,respiratory and cardiac monitoring, extensive review of multiple databases, frequent neurological assessment, discussion with family, other specialists and medical decision making of high complexity.I have made any additions or clarifications directly to the above note.This critical care time does not reflect procedure time, or teaching time or supervisory time of PA/NP/Med Resident etc but could involve care discussion time.  I spent 30 minutes of neurocritical care time  in the care of  this patient.      Delia Heady, MD Medical Director [facility] Pager: [phone] 11/28/2016 12:06 PM   To contact Stroke Continuity provider, please refer to [email or web address]. After hours, contact General Neurology

## 2016-11-28 NOTE — Consult Note (Signed)
Physical Medicine and Rehabilitation Consult   Reason for Consult:  Right sided weakness and difficulty speaking due to stroke.  Referring Physician: Dr. Pearlean Brownie   HPI: Jason Nolan is a 71 y.o. left handed male with history of Hep C, glaucoma who was admitted via [facility] on 7/23 with left sided weakness, facial droop and slurred speech due to early changes of R-MCA territory infarct.  He underwent CTA brain showed short segment near occlusion of proximal R-ICA with associated intraluminal thrombus --likely due to dissection and emergent large vessel occlusion with right M2 occlusion and evolving right frontal lobe infarct.  He underwent cerebral angio with  complete revascularization of occluded superior division of R-MCA and near complete revascularization of proximal R-ICA with stent assisted angioplasty. MRI brain done revealing right insula and frontal parietal junction acute infarct, several punctate foci of acute infarct in right frontal lobe, right parietal lobe, right occipital lobe, and left superior cerebellar hemisphere and stable right sylvian/posterior frontal convexity extra axial signal from extravasated contrast and/or SAH.   He tolerated extubation on 7/25. 2 D echo with EF 60-65% with no wall abnormality.  Carotid dopplers showed 40-50% right proximal ICA stenosis, L- 1-39% stenosis and elevated velocities in B-VA. MBS done and patient started on dysphagia 3, thin liquids today. Patient with resultant left sided weakness, expressive deficits with aphonia and dysphagia. Therapy evaluations done today and CIR recommended due to deficits in mobility and ability to carry out ADL tasks.    Review of Systems  HENT: Negative for hearing loss and tinnitus.   Eyes: Negative for blurred vision and double vision.  Respiratory: Negative for cough and shortness of breath.   Cardiovascular: Negative for chest pain and palpitations.  Gastrointestinal: Negative for abdominal pain,  heartburn and nausea.  Genitourinary: Negative for dysuria and urgency.  Musculoskeletal: Negative for back pain, joint pain, myalgias and neck pain.  Skin: Negative for rash.  Neurological: Positive for sensory change, speech change, focal weakness and weakness.   Past Medical History:  Diagnosis Date  . Glaucoma   . Hepatitis C     Past Surgical History:  Procedure Laterality Date  . IR ANGIO INTRA EXTRACRAN SEL COM CAROTID INNOMINATE UNI L MOD SED  11/25/2016  . IR ANGIO VERTEBRAL SEL SUBCLAVIAN INNOMINATE UNI R MOD SED  11/25/2016  . IR ANGIO VERTEBRAL SEL VERTEBRAL UNI L MOD SED  11/25/2016  . IR INTRAVSC STENT CERV CAROTID W/O EMB-PROT MOD SED INC ANGIO  11/25/2016  . IR PERCUTANEOUS ART THROMBECTOMY/INFUSION INTRACRANIAL INC DIAG ANGIO  11/25/2016  . RADIOLOGY WITH ANESTHESIA N/A 11/25/2016   Procedure: RADIOLOGY WITH ANESTHESIA;  Surgeon: Radiologist, Medication, MD;  Location: [facility] OR;  Service: Radiology;  Laterality: N/A;    Family History  Problem Relation Age of Onset  . Hypertension Mother   . Hypertension Father     Social History:  Married. Retired. Indicated that wife in good health. Per reports  he quit smoking. He quit smokeless tobacco use about 23 years ago. He reports that he does not drink alcohol or use drugs.    Allergies: No Known Allergies    Medications Prior to Admission  Medication Sig Dispense Refill  . PAZEO 0.7 % SOLN Place 1 drop into both eyes daily.     Marland Kitchen VYZULTA 0.024 % SOLN Apply 1 drop to eye 2 (two) times daily.       Home: Home Living Family/patient expects to be discharged to:: (P) Private residence Living Arrangements: (  P) Spouse/significant other Available Help at Discharge: (P) Family, Available 24 hours/day Type of Home: (P) House Home Access: (P) Level entry Home Layout: (P) One level Bathroom Shower/Tub: (P) Walk-in shower, Door [employer] Toilet: (P) Standard Bathroom Accessibility: (P) Yes Home Equipment: (P) Shower seat -  built in  Functional History: Prior Function Level of Independence: (P) Independent Comments: (P) Pt is retired. States he exercises Functional Status:  Mobility: Bed Mobility Overal bed mobility: (P) Needs Assistance Bed Mobility: (P) Supine to Sit Supine to sit: (P) Min assist, +2 for physical assistance General bed mobility comments: (P) Assist for LE advancement to [name], as well as for trunk elevation to full sitting. VC's for railing use with RUE.  Transfers Overall transfer level: (P) Needs assistance Equipment used: (P) 2 person hand held assist Transfers: (P) Sit to/from Stand, Stand Pivot Transfers Sit to Stand: (P) Mod assist, +2 physical assistance Stand pivot transfers: (P) Mod assist, +2 physical assistance General transfer comment: (P) +2 assist to power-up to full standing position. L knee blocked and LUE supported throughout transfer. Once standing, pt able to improve posture with cues but was unable to maintain. Pt took a few pivotal steps around to the recliner with support at gait belt and assist for weight shift.       ADL: ADL Overall ADL's : (P) Needs assistance/impaired  Cognition: Cognition Overall Cognitive Status: (P) Within Functional Limits for tasks assessed Arousal/Alertness: Awake/alert Orientation Level: Oriented X4, Other (comment) (nods and gestures appropriately) Attention: Sustained Sustained Attention: Appears intact Cognition Arousal/Alertness: (P) Awake/alert Behavior During Therapy: (P) WFL for tasks assessed/performed Overall Cognitive Status: (P) Within Functional Limits for tasks assessed General Comments: (P) Significant expressive aphasia - pt unable to answer questions verbally however was answering therapist with head and hand gestures.  Difficult to assess due to: (P) Impaired communication   Blood pressure (!) 154/66, pulse 70, temperature 98.9 F (37.2 C), temperature source Oral, resp. rate 17, height 5\' 10"  (1.778 m), weight  96.9 kg (213 lb 10 oz), SpO2 100 %. Physical Exam  Nursing note and vitals reviewed. Constitutional: He is oriented to person, place, and time. He appears well-developed and well-nourished.  HENT:  Head: Normocephalic and atraumatic.  Mouth/Throat: Oropharynx is clear and moist.  Eyes: Pupils are equal, round, and reactive to light. Conjunctivae and EOM are normal.  Neck: Normal range of motion. Neck supple.  Cardiovascular: Normal rate and regular rhythm.   No murmur heard. Respiratory: Effort normal and breath sounds normal. No stridor.  GI: Soft. Bowel sounds are normal. He exhibits no distension. There is no tenderness.  Musculoskeletal: He exhibits no edema or tenderness.  Neurological: He is alert and oriented to person, place, and time.  Fatigued appearing. Expressive aphasia with ability to make a couple of sounds. Able to follow one and two step commands without difficulty. Able to point.   Skin: Skin is warm and dry.  Motor strength is 5/5 in the right deltoid, bicep, tricep, grip, hip flexor, knee extensor, ankle dorsiflexor. Left upper extremity 0/5 in the deltoid, bicep, tricep, finger flexors and extensors. Left lower extremity has 2 minus, foot inversion, eversion, trace extension. Sensation difficult to assess secondary to aphasia. Extraocular muscles intact  Results for orders placed or performed during the hospital encounter of 11/25/16 (from the past 24 hour(s))  CBC     Status: Abnormal   Collection Time: 11/28/16  2:16 AM  Result Value Ref Range   WBC 4.2 4.0 - 10.5 K/uL  RBC 4.09 (L) 4.22 - 5.81 MIL/uL   Hemoglobin 12.6 (L) 13.0 - 17.0 g/dL   HCT 62.1 (L) 30.8 - 65.7 %   MCV 93.6 78.0 - 100.0 fL   MCH 30.8 26.0 - 34.0 pg   MCHC 32.9 30.0 - 36.0 g/dL   RDW 84.6 96.2 - 95.2 %   Platelets 154 150 - 400 K/uL  Basic metabolic panel     Status: Abnormal   Collection Time: 11/28/16  2:16 AM  Result Value Ref Range   Sodium 138 135 - 145 mmol/L   Potassium 3.6  3.5 - 5.1 mmol/L   Chloride 107 101 - 111 mmol/L   CO2 25 22 - 32 mmol/L   Glucose, Bld 106 (H) 65 - 99 mg/dL   BUN 8 6 - 20 mg/dL   Creatinine, Ser 8.41 0.61 - 1.24 mg/dL   Calcium 8.0 (L) 8.9 - 10.3 mg/dL   GFR calc non Af Amer >60 >60 mL/min   GFR calc Af Amer >60 >60 mL/min   Anion gap 6 5 - 15  Magnesium     Status: None   Collection Time: 11/28/16  2:16 AM  Result Value Ref Range   Magnesium 2.2 1.7 - 2.4 mg/dL  Phosphorus     Status: None   Collection Time: 11/28/16  2:16 AM  Result Value Ref Range   Phosphorus 2.9 2.5 - 4.6 mg/dL  Platelet inhibition L2G40 (Not at [facility])     Status: Abnormal   Collection Time: 11/28/16 10:22 AM  Result Value Ref Range   Platelet Function  P2Y12 52 (L) 194 - 418 PRU   Mr Brain Wo Contrast  Result Date: 11/26/2016 CLINICAL DATA:  71 y/o M; clot retrieval and carotid stent yesterday, evaluate extent of stroke. EXAM: MRI HEAD WITHOUT CONTRAST TECHNIQUE: Multiplanar, multiecho pulse sequences of the brain and surrounding structures were obtained without intravenous contrast. COMPARISON:  11/25/2016 CT of the head and CT angiogram of the head. FINDINGS: Brain: Region of reduced diffusion compatible with acute infarction measures 3.9 x 4.1 x 4.7 cm (volume = 39 cm^3) (AP x ML x CC, series 300, image 33 and series 400, image 17). The infarct is centered in the right frontoparietal junction and insula. There are a few additional punctate scattered foci throughout the right MCA distribution, within the right occipital lobe, and within the left superior cerebellar hemisphere. Areas of infarction demonstrate associated T2 hyperintense signal abnormality and minimal local mass effect. Incomplete FLAIR suppression of sulci with minimal SWI hypointensity within the right sylvian fissure and overlying the right posterior frontal lobe infarction which may represent a combination of extravasated contrast and/or hemorrhage as seen on prior CT of the head. The  distribution is stable from prior CT. No extra-axial collection, hydrocephalus, or herniation. Vascular: Normal central flow voids. Skull and upper cervical spine: Nonspecific low signal focus within the right parietal bone near the vertex measuring 20 mm (series 9, image 91) with sclerosis on CT, probably a bone island. Sinuses/Orbits: Moderate diffuse paranasal sinus mucosal thickening with small fluid levels in the sphenoid sinuses. Trace mastoid effusions. Paranasal sinus disease and mastoid effusions are probably due to intubation. Other: None. IMPRESSION: 1. Right insula and frontal parietal junction acute infarction with volume of 39 cc. Several additional punctate foci of acute infarction are present scattered throughout the right frontal lobe, right parietal lobe, right occipital lobe, and left superior cerebellar hemisphere. 2. Stable right sylvian and posterior frontal convexity extra-axial signal abnormality from extravasated  contrast and/or subarachnoid hemorrhage in comparison with prior CT given differences in technique. Electronically Signed   By: Mitzi Hansen M.D.   On: 11/26/2016 22:29   Dg Chest Port 1 View  Result Date: 11/27/2016 CLINICAL DATA:  Acute CVA, intubated patient EXAM: PORTABLE CHEST 1 VIEW COMPARISON:  Portable chest x-ray of November 25, 2016 FINDINGS: The lungs are adequately inflated. There is subsegmental atelectasis in the perihilar regions greatest on the left. There is no alveolar infiltrate. There is no pleural effusion or pneumothorax. The heart and pulmonary vascularity are normal. The endotracheal tube tip lies approximately 4.8 cm above the carina. The esophagogastric tube tip and proximal port project below the inferior margin of the image. IMPRESSION: Perihilar subsegmental atelectasis. No pneumonia nor pulmonary edema. The support tubes are in reasonable position. Electronically Signed   By: David  [place] M.D.   On: 11/27/2016 07:22   Dg Abd Portable  1v  Result Date: 11/27/2016 CLINICAL DATA:  Feeding tube placement EXAM: PORTABLE ABDOMEN - 1 VIEW COMPARISON:  None. FINDINGS: Feeding tube tip is in the distal stomach. Nonobstructive bowel gas pattern with mild gaseous distention of bowel diffusely. IMPRESSION: Feeding tube tip in the distal stomach. Electronically Signed   By: Charlett Nose M.D.   On: 11/27/2016 14:39   Dg Swallowing Func-speech Pathology  Result Date: 11/28/2016 Objective Swallowing Evaluation: Type of Study: MBS-Modified Barium Swallow Study Patient Details Name: Jason Nolan MRN: [id number] Date of Birth: 05-12-1945 Today's Date: 11/28/2016 Time: SLP Start Time (ACUTE ONLY): 1120-SLP Stop Time (ACUTE ONLY): 1240 SLP Time Calculation (min) (ACUTE ONLY): 80 min Past Medical History: Past Medical History: Diagnosis Date . Glaucoma  . Hepatitis C  Past Surgical History: Past Surgical History: Procedure Laterality Date . IR ANGIO INTRA EXTRACRAN SEL COM CAROTID INNOMINATE UNI L MOD SED  11/25/2016 . IR ANGIO VERTEBRAL SEL SUBCLAVIAN INNOMINATE UNI R MOD SED  11/25/2016 . IR ANGIO VERTEBRAL SEL VERTEBRAL UNI L MOD SED  11/25/2016 . IR INTRAVSC STENT CERV CAROTID W/O EMB-PROT MOD SED INC ANGIO  11/25/2016 . IR PERCUTANEOUS ART THROMBECTOMY/INFUSION INTRACRANIAL INC DIAG ANGIO  11/25/2016 . RADIOLOGY WITH ANESTHESIA N/A 11/25/2016  Procedure: RADIOLOGY WITH ANESTHESIA;  Surgeon: Radiologist, Medication, MD;  Location: [facility] OR;  Service: Radiology;  Laterality: N/A; HPI: 71 year old male with PMH as below, which is significant for Hepatitic C s/p treatment with Harvoni. He was last known normal at 2am on 7/23 when he went to sleep. He woke up around 9am with left sided weakness, facial droop, and slurred speech. He presented to [facility] where he was found to have a R MCA CVA, but was outside window for tpa. He was transferred to [facility] for IR evaluation. He underwent successful revascularization of R MCA and R ICA. Post-procedurally he remained on ventilator  and was transferred to ICU. Concern for potential hemorrhage post IR proedure, but may have been extravasation of dye and not a bleed. Intubated 7/23 to 7/25 in am. 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 Pt is left handed.  No Data Recorded Assessment / Plan / Recommendation CHL IP CLINICAL IMPRESSIONS 11/28/2016 Clinical Impression Despite significant left lingual weakness and left labial/facial sensory loss, pt is able to tolerate thin liquids and masticate regular solids without oral residuals. There is mild anterior spillage, but no retained barium in the buccal cavity. Oropharyngeal phase WNL. Primary concern is pt biting his tongue during mastication, possibly without awareness. Recommend pt attempt a dys 3 mechanical soft diet  with thin liquids, but will downgrade texture as needed if he cannot manage his tongue. May also need to attempt compensatory strategies for anterior loss. Will follow for tolerance.  SLP Visit Diagnosis Dysphagia, oral phase (R13.11) Attention and concentration deficit following -- Frontal lobe and executive function deficit following -- Impact on safety and function Mild aspiration risk   CHL IP TREATMENT RECOMMENDATION 11/28/2016 Treatment Recommendations Therapy as outlined in treatment plan below   Prognosis 11/28/2016 Prognosis for Safe Diet Advancement Good Barriers to Reach Goals -- Barriers/Prognosis Comment -- CHL IP DIET RECOMMENDATION 11/28/2016 SLP Diet Recommendations Dysphagia 3 (Mech soft) solids;Thin liquid Liquid Administration via Cup;Straw Medication Administration Whole meds with liquid Compensations Other (Comment) Postural Changes Remain semi-upright after after feeds/meals (Comment);Seated upright at 90 degrees   CHL IP OTHER RECOMMENDATIONS 11/28/2016 Recommended Consults -- Oral Care Recommendations Oral care BID Other Recommendations --   CHL IP FOLLOW UP RECOMMENDATIONS 11/28/2016 Follow up Recommendations Inpatient Rehab   CHL IP  FREQUENCY AND DURATION 11/28/2016 Speech Therapy Frequency (ACUTE ONLY) min 2x/week Treatment Duration 2 weeks      CHL IP ORAL PHASE 11/28/2016 Oral Phase Impaired Oral - Pudding Teaspoon -- Oral - Pudding Cup -- Oral - Honey Teaspoon -- Oral - Honey Cup -- Oral - Nectar Teaspoon -- Oral - Nectar Cup -- Oral - Nectar Straw -- Oral - Thin Teaspoon -- Oral - Thin Cup Left anterior bolus loss Oral - Thin Straw Left anterior bolus loss Oral - Puree WFL Oral - Mech Soft -- Oral - Regular WFL Oral - Multi-Consistency -- Oral - Pill WFL Oral Phase - Comment --  CHL IP PHARYNGEAL PHASE 11/28/2016 Pharyngeal Phase WFL Pharyngeal- Pudding Teaspoon -- Pharyngeal -- Pharyngeal- Pudding Cup -- Pharyngeal -- Pharyngeal- Honey Teaspoon -- Pharyngeal -- Pharyngeal- Honey Cup -- Pharyngeal -- Pharyngeal- Nectar Teaspoon -- Pharyngeal -- Pharyngeal- Nectar Cup -- Pharyngeal -- Pharyngeal- Nectar Straw -- Pharyngeal -- Pharyngeal- Thin Teaspoon -- Pharyngeal -- Pharyngeal- Thin Cup -- Pharyngeal -- Pharyngeal- Thin Straw -- Pharyngeal -- Pharyngeal- Puree -- Pharyngeal -- Pharyngeal- Mechanical Soft -- Pharyngeal -- Pharyngeal- Regular -- Pharyngeal -- Pharyngeal- Multi-consistency -- Pharyngeal -- Pharyngeal- Pill -- Pharyngeal -- Pharyngeal Comment --  No flowsheet data found. No flowsheet data found. Harlon DittyBonnie DeBlois, MA CCC-SLP [phone] DeBlois, [name] 11/28/2016, 12:20 PM               Assessment/Plan: Diagnosis: Left hemiparesis, aphasia, as well as dysphagia secondary to right MCA infarct 1. Does the need for close, 24 hr/day medical supervision in concert with the patient's rehab needs make it unreasonable for this patient to be served in a less intensive setting? Yes 2. Co-Morbidities requiring supervision/potential complications: Hep C, glaucoma, at risk for aspiration pneumonia 3. Due to bladder management, bowel management, safety, skin/wound care, disease management, medication administration, pain management  and patient education, does the patient require 24 hr/day rehab nursing? Yes 4. Does the patient require coordinated care of a physician, rehab nurse, PT (1-2 hrs/day, 5 days/week), OT (1-2 hrs/day, 5 days/week) and SLP (.5-1 hrs/day, 5 days/week) to address physical and functional deficits in the context of the above medical diagnosis(es)? Yes Addressing deficits in the following areas: balance, endurance, locomotion, strength, transferring, bowel/bladder control, bathing, dressing, feeding, grooming, toileting, cognition, speech, language, swallowing and psychosocial support 5. Can the patient actively participate in an intensive therapy program of at least 3 hrs of therapy per day at least 5 days per week? Yes 6. The potential for patient to  make measurable gains while on inpatient rehab is excellent 7. Anticipated functional outcomes upon discharge from inpatient rehab are supervision  with PT, supervision with OT, supervision with SLP. 8. Estimated rehab length of stay to reach the above functional goals is: 21-25d 9. Anticipated D/C setting: Home 10. Anticipated post D/C treatments: HH therapy 11. Overall Rehab/Functional Prognosis: excellent  RECOMMENDATIONS: This patient's condition is appropriate for continued rehabilitative care in the following setting: CIR Patient has agreed to participate in recommended program. Yes Note that insurance prior authorization may be required for reimbursement for recommended care.  Comment:   Erick ColaceAndrew E. Jolyn Deshmukh M.D. [facility] FAAPM&R (Sports Med, Neuromuscular Med) Diplomate Am Board of Electrodiagnostic Med  Jerene PitchLove, Pamela S, PA-C 11/28/2016

## 2016-11-28 NOTE — PMR Pre-admission (Signed)
PMR Admission Coordinator Pre-Admission Assessment  Patient: Jason Nolan is an 71 y.o., male MRN: [id number] DOB: 1946-04-03 Height: 5' 10"  (177.8 cm) Weight: 96.9 kg (213 lb 10 oz)              Insurance Information HMO:     PPO:      PCP:      IPA:      80/20: yes     OTHER: no HMO PRIMARY: Medicare a and b      Policy#: [id number]      Subscriber: pt Benefits:  Phone #: passport one online     Name: 11/28/16 Eff. Date: 02/03/2013     Deduct: $1340      Out of Pocket Max: none      Life Max: none CIR: 100%      SNF: 20 full days Outpatient: 80%     Co-Pay: 20% Home Health: 100%      Co-Pay: none DME: 80%     Co-Pay: 20% Providers: pt choice  SECONDARY: [employer]      Policy#: [id number]      Subscriber: pt  Medicaid Application Date:       Case Manager:  Disability Application Date:       Case Worker:   Emergency Hollis    Name Relation Home Work Mobile   Garms-Wilson,Maxine Spouse [phone]  [phone]     Current Medical History  Patient Admitting Diagnosis: right MCA infarct  History of Present Illness: Jason Nolan is a 71 y.o. left handed male with history of Hep C, glaucoma who was admitted via [facility] on 7/23 with left sided weakness, facial droop and slurred speech due to early changes of R-MCA territory infarct.  He underwent CTA brain showed short segment near occlusion of proximal R-ICA with associated intraluminal thrombus --likely due to dissection and emergent large vessel occlusion with right M2 occlusion and evolving right frontal lobe infarct.  He underwent cerebral angio with  complete revascularization of occluded superior division of R-MCA and near complete revascularization of proximal R-ICA with stent assisted angioplasty. MRI brain done revealing right insula and frontal parietal junction acute infarct, several punctate foci of acute infarct in right frontal lobe, right parietal lobe, right occipital lobe, and left superior  cerebellar hemisphere and stable right sylvian/posterior frontal convexity extra axial signal from extravasated contrast and/or SAH.   He tolerated extubation on 7/25. 2 D echo with EF 60-65% with no wall abnormality.  Carotid dopplers showed 40-50% right proximal ICA stenosis, L- 1-39% stenosis and elevated velocities in B-VA. MBS done and patient started on dysphagia 3, thin liquids today. Patient with resultant left sided weakness, expressive deficits with aphonia and dysphagia.   Total: 10 NIHSS  Past Medical History  Past Medical History:  Diagnosis Date  . Glaucoma   . Hepatitis C     Family History  family history includes Hypertension in his father and mother.  Prior Rehab/Hospitalizations:  Has the patient had major surgery during 100 days prior to admission? No  Current Medications   Current Facility-Administered Medications:  .  0.9 %  sodium chloride infusion, , Intravenous, Continuous, Amie Portland, MD, Last Rate: 75 mL/hr at 11/28/16 2149 .  acetaminophen (TYLENOL) tablet 650 mg, 650 mg, Oral, Q4H PRN, 650 mg at 11/28/16 2205 **OR** acetaminophen (TYLENOL) solution 650 mg, 650 mg, Per Tube, Q4H PRN, 650 mg at 11/27/16 2023 **OR** acetaminophen (TYLENOL) suppository 650 mg, 650 mg, Rectal, Q4H PRN, Luanne Bras, MD .  aspirin chewable tablet 81 mg, 81 mg, Oral, Daily, Deveshwar, Sanjeev, MD, 81 mg at 11/29/16 1116 .  atorvastatin (LIPITOR) tablet 40 mg, 40 mg, Oral, q1800, Patteson, Samuel A, NP, 40 mg at 11/28/16 1701 .  chlorhexidine gluconate (MEDLINE KIT) (PERIDEX) 0.12 % solution 15 mL, 15 mL, Mouth Rinse, BID, Corey Harold, NP, 15 mL at 11/29/16 0800 .  heparin injection 5,000 Units, 5,000 Units, Subcutaneous, Q8H, Amie Portland, MD, 5,000 Units at 11/29/16 0502 .  hydrALAZINE (APRESOLINE) injection 10-20 mg, 10-20 mg, Intravenous, Q4H PRN, Corey Harold, NP, 10 mg at 11/27/16 1884 .  latanoprost (XALATAN) 0.005 % ophthalmic solution 1 drop, 1 drop, Both  Eyes, QHS, Corey Harold, NP, 1 drop at 11/28/16 2153 .  MEDLINE mouth rinse, 15 mL, Mouth Rinse, q12n4p, Garvin Fila, MD, 15 mL at 11/28/16 1701 .  olopatadine (PATANOL) 0.1 % ophthalmic solution 1 drop, 1 drop, Both Eyes, BID, Corey Harold, NP, 1 drop at 11/29/16 1117 .  ondansetron (ZOFRAN) injection 4 mg, 4 mg, Intravenous, Q6H PRN, Deveshwar, Sanjeev, MD .  pantoprazole (PROTONIX) EC tablet 40 mg, 40 mg, Oral, Daily, Garvin Fila, MD, 40 mg at 11/29/16 1115 .  senna-docusate (Senokot-S) tablet 1 tablet, 1 tablet, Oral, QHS PRN, Amie Portland, MD .  ticagrelor (BRILINTA) tablet 90 mg, 90 mg, Oral, BID, Deveshwar, Sanjeev, MD, 90 mg at 11/29/16 1115  Patients Current Diet: DIET DYS 3 Room service appropriate? Yes; Fluid consistency: Thin. MBS 7/26/ meds whole with liquids. Cortrak remains in place with diet begun 11/28/16  Precautions / Restrictions Precautions Precautions: Fall Precaution Comments: Permissive HTN <220/120  Restrictions Weight Bearing Restrictions: No   Has the patient had 2 or more falls or a fall with injury in the past year?No  Prior Activity Level Community (5-7x/wk): retired; driving, independent, gym three times per week and golfs. Retired to [employer] 8 years ago. Goes to gym 3 times per week, golf's a lot.   Home Assistive Devices / Equipment Home Assistive Devices/Equipment: Eyeglasses Home Equipment: Shower seat - built in  Prior Device Use: Indicate devices/aids used by the patient prior to current illness, exacerbation or injury? None of the above  Prior Functional Level Prior Function Level of Independence: Independent Comments: retired Administrator, arts; [employer] for 8 years  Self Care: Did the patient need help bathing, dressing, using the toilet or eating?  Independent  Indoor Mobility: Did the patient need assistance with walking from room to room (with or without device)? Independent  Stairs: Did the patient need assistance with internal or  external stairs (with or without device)? Independent  Functional Cognition: Did the patient need help planning regular tasks such as shopping or remembering to take medications? Independent  Current Functional Level Cognition  Arousal/Alertness: Awake/alert Overall Cognitive Status: Within Functional Limits for tasks assessed Difficult to assess due to: Impaired communication Orientation Level: Oriented X4, Other (comment) (gestures appropriately; difficult to assess d/t speech) General Comments: Significant expressive aphasia - pt unable to answer questions verbally however was answering therapist with head and hand gestures.  Attention: Sustained Sustained Attention: Appears intact    Extremity Assessment (includes Sensation/Coordination)  Upper Extremity Assessment: LUE deficits/detail LUE Deficits / Details: Grossly 3/5. Pt reports numbness in LUE (distal>proximal). Increased edema noted. LUE Sensation: decreased light touch LUE Coordination: decreased fine motor, decreased gross motor  Lower Extremity Assessment: Defer to PT evaluation LLE Deficits / Details: Grossly 4-/5 strength in hip flexors, quads, hams. Pt with good muscle tone  in bilateral LE's.  LLE Sensation: decreased light touch (Pt reports numbness from foot to thigh on L)    ADLs  Overall ADL's : Needs assistance/impaired Eating/Feeding: Moderate assistance, Sitting Eating/Feeding Details (indicate cue type and reason): Pt able to bring cup to mouth to expell secretions Grooming: Maximal assistance, Sitting Upper Body Bathing: Maximal assistance, Sitting Lower Body Bathing: Total assistance, Sit to/from stand Upper Body Dressing : Maximal assistance, Sitting Upper Body Dressing Details (indicate cue type and reason): to don gown Lower Body Dressing: Total assistance Lower Body Dressing Details (indicate cue type and reason): to don socks Toilet Transfer: Moderate assistance, +2 for physical assistance,  Stand-pivot Toilet Transfer Details (indicate cue type and reason): Simulated by transfer [name] > chair Functional mobility during ADLs: Moderate assistance, +2 for physical assistance General ADL Comments: Discussed post acute rehab with pt; he is agreeable. Educated pt on protecting RUE from injury, propping on pillow for edema control and protection.    Mobility  Overal bed mobility: Needs Assistance Bed Mobility: Supine to Sit Supine to sit: Min assist General bed mobility comments: increased time, min A with L LE movement, transferring towards his R side, use of R UE on bed rail    Transfers  Overall transfer level: Needs assistance Equipment used: 2 person hand held assist Transfers: Sit to/from Stand, Stand Pivot Transfers Sit to Stand: Mod assist, +2 physical assistance Stand pivot transfers: Mod assist, +2 physical assistance General transfer comment: +2 assist to power-up to full standing position, LUE supported throughout transfer. Once standing, pt able to improve posture with cues but was unable to maintain. Pt took a few pivotal steps around to the recliner with support at gait belt and physical assist with L LE movement    Ambulation / Gait / Stairs / Wheelchair Mobility       Posture / Balance Dynamic Sitting Balance Sitting balance - Comments: pt able to sit [name] with supervision Balance Overall balance assessment: Needs assistance Sitting-balance support: Feet supported, No upper extremity supported Sitting balance-Leahy Scale: Fair Sitting balance - Comments: pt able to sit [name] with supervision Standing balance support: Bilateral upper extremity supported, During functional activity Standing balance-Leahy Scale: Poor Standing balance comment: mod A x2    Special needs/care consideration BiPAP/CPAP  N/a CPM  N/a Continuous Drip IV n/a Dialysis  N/a Life Vest  N/a Oxygen n/a Special Bed  N/a Trach Size  N/a Wound Vac n/a Skin intact    Cortrak clamped not in  use since 11/28/16                          Bowel mgmt: no LBM documented Bladder mgmt: condom catheter Diabetic mgmt Hgb A1c 5.4 Cortrak clamped since 11/28/16 after MBS passed   Previous Home Environment Living Arrangements: Spouse/significant other  Lives With: Spouse Available Help at Discharge: Family, Available 24 hours/day Type of Home: House Home Layout: One level Home Access: Level entry Bathroom Shower/Tub: Gaffer, Door [employer] Toilet: Standard Bathroom Accessibility: Yes Home Care Services: No  Discharge Living Setting Plans for Discharge Living Setting: Patient's home, Lives with (comment) Type of Home at Discharge: House Discharge Home Layout: One level Discharge Home Access: Level entry Discharge Bathroom Shower/Tub: Walk-in shower Discharge Bathroom Toilet: Standard Discharge Bathroom Accessibility: Yes How Accessible: Accessible via walker Does the patient have any problems obtaining your medications?: No  Social/Family/Support Systems Patient Roles: Spouse Contact Information: Maxine Anticipated Caregiver: wife Anticipated Caregiver's Contact Information: see  above Ability/Limitations of Caregiver: wife does not drive Caregiver Availability: 24/7 Discharge Plan Discussed with Primary Caregiver: Yes Is Caregiver In Agreement with Plan?: Yes Does Caregiver/Family have Issues with Lodging/Transportation while Pt is in Rehab?: No  Goals/Additional Needs Patient/Family Goal for Rehab: supervision with PT, OT, and SLP Expected length of stay: [facility]OS 21-25 days Pt/Family Agrees to Admission and willing to participate: Yes Program Orientation Provided & Reviewed with Pt/Caregiver Including Roles  & Responsibilities: Yes  Decrease burden of Care through IP rehab admission: n/a  Possible need for SNF placement upon discharge:not anticipated  Patient Condition: This patient's condition remains as documented in the consult dated 11/28/2016, in which the  Rehabilitation Physician determined and documented that the patient's condition is appropriate for intensive rehabilitative care in an inpatient rehabilitation facility. Will admit to inpatient rehab today.  Preadmission Screen Completed By:  Cleatrice Burke, 11/29/2016 12:27 PM ______________________________________________________________________   Discussed status with Dr. Naaman Plummer on 11/29/16 at  1223 and received telephone approval for admission today.  Admission Coordinator:  Cleatrice Burke, time [id number] Date 11/29/2016

## 2016-11-28 NOTE — Progress Notes (Signed)
Pt arrived to 5C07 via bed.  Pt alert, non verbal, nods and gestures appropriately.  VSS.  Will continue to monitor.  Sondra ComeSilva, Feleshia Zundel M, RN

## 2016-11-29 ENCOUNTER — Inpatient Hospital Stay (HOSPITAL_COMMUNITY)
Admission: RE | Admit: 2016-11-29 | Discharge: 2016-12-21 | DRG: 057 | Disposition: A | Discharge status: Home Health | Payer: Medicare Other | Source: Intra-hospital | Attending: Physical Medicine & Rehabilitation | Admitting: Physical Medicine & Rehabilitation

## 2016-11-29 ENCOUNTER — Encounter (HOSPITAL_COMMUNITY): Payer: Self-pay

## 2016-11-29 DIAGNOSIS — E46 Unspecified protein-calorie malnutrition: Secondary | ICD-10-CM | POA: Diagnosis present

## 2016-11-29 DIAGNOSIS — R29810 Facial weakness: Secondary | ICD-10-CM | POA: Diagnosis present

## 2016-11-29 DIAGNOSIS — I639 Cerebral infarction, unspecified: Secondary | ICD-10-CM

## 2016-11-29 DIAGNOSIS — I63511 Cerebral infarction due to unspecified occlusion or stenosis of right middle cerebral artery: Secondary | ICD-10-CM | POA: Insufficient documentation

## 2016-11-29 DIAGNOSIS — I69353 Hemiplegia and hemiparesis following cerebral infarction affecting right non-dominant side: Principal | ICD-10-CM

## 2016-11-29 DIAGNOSIS — R1312 Dysphagia, oropharyngeal phase: Secondary | ICD-10-CM | POA: Diagnosis not present

## 2016-11-29 DIAGNOSIS — R1310 Dysphagia, unspecified: Secondary | ICD-10-CM | POA: Diagnosis present

## 2016-11-29 DIAGNOSIS — I69398 Other sequelae of cerebral infarction: Secondary | ICD-10-CM

## 2016-11-29 DIAGNOSIS — I69391 Dysphagia following cerebral infarction: Secondary | ICD-10-CM | POA: Diagnosis not present

## 2016-11-29 DIAGNOSIS — G8194 Hemiplegia, unspecified affecting left nondominant side: Secondary | ICD-10-CM

## 2016-11-29 DIAGNOSIS — I69322 Dysarthria following cerebral infarction: Secondary | ICD-10-CM | POA: Diagnosis not present

## 2016-11-29 DIAGNOSIS — R4701 Aphasia: Secondary | ICD-10-CM

## 2016-11-29 DIAGNOSIS — D62 Acute posthemorrhagic anemia: Secondary | ICD-10-CM | POA: Diagnosis not present

## 2016-11-29 DIAGNOSIS — I10 Essential (primary) hypertension: Secondary | ICD-10-CM | POA: Insufficient documentation

## 2016-11-29 DIAGNOSIS — Z8673 Personal history of transient ischemic attack (TIA), and cerebral infarction without residual deficits: Secondary | ICD-10-CM | POA: Diagnosis present

## 2016-11-29 DIAGNOSIS — Z87891 Personal history of nicotine dependence: Secondary | ICD-10-CM | POA: Diagnosis not present

## 2016-11-29 DIAGNOSIS — D709 Neutropenia, unspecified: Secondary | ICD-10-CM | POA: Diagnosis not present

## 2016-11-29 DIAGNOSIS — Z79899 Other long term (current) drug therapy: Secondary | ICD-10-CM | POA: Diagnosis not present

## 2016-11-29 DIAGNOSIS — R269 Unspecified abnormalities of gait and mobility: Secondary | ICD-10-CM | POA: Diagnosis not present

## 2016-11-29 DIAGNOSIS — Z8249 Family history of ischemic heart disease and other diseases of the circulatory system: Secondary | ICD-10-CM | POA: Diagnosis not present

## 2016-11-29 DIAGNOSIS — I69320 Aphasia following cerebral infarction: Secondary | ICD-10-CM | POA: Diagnosis not present

## 2016-11-29 DIAGNOSIS — H409 Unspecified glaucoma: Secondary | ICD-10-CM | POA: Diagnosis present

## 2016-11-29 DIAGNOSIS — G8320 Monoplegia of upper limb affecting unspecified side: Secondary | ICD-10-CM | POA: Diagnosis not present

## 2016-11-29 DIAGNOSIS — Z6827 Body mass index (BMI) 27.0-27.9, adult: Secondary | ICD-10-CM

## 2016-11-29 DIAGNOSIS — E8809 Other disorders of plasma-protein metabolism, not elsewhere classified: Secondary | ICD-10-CM | POA: Diagnosis present

## 2016-11-29 MED ORDER — ATORVASTATIN CALCIUM 40 MG PO TABS
40.0000 mg | ORAL_TABLET | Freq: Every day | ORAL | Status: DC
  Administered 2016-11-30 – 2016-12-20 (×21): 40 mg via ORAL
  Filled 2016-11-29 (×21): qty 1

## 2016-11-29 MED ORDER — LATANOPROST 0.005 % OP SOLN
1.0000 [drp] | Freq: Every day | OPHTHALMIC | Status: DC
  Administered 2016-11-29 – 2016-12-20 (×23): 1 [drp] via OPHTHALMIC
  Filled 2016-11-29 (×2): qty 2.5

## 2016-11-29 MED ORDER — PANTOPRAZOLE SODIUM 40 MG PO TBEC
40.0000 mg | DELAYED_RELEASE_TABLET | Freq: Every day | ORAL | Status: DC
  Administered 2016-11-29: 40 mg via ORAL
  Filled 2016-11-29: qty 1

## 2016-11-29 MED ORDER — PROCHLORPERAZINE EDISYLATE 5 MG/ML IJ SOLN: 5.0000 mg | Freq: Four times a day (QID) | INTRAMUSCULAR | Status: DC | PRN

## 2016-11-29 MED ORDER — ACETAMINOPHEN 325 MG PO TABS: 325.0000 mg | ORAL_TABLET | ORAL | Status: DC | PRN

## 2016-11-29 MED ORDER — BISACODYL 10 MG RE SUPP: 10.0000 mg | Freq: Every day | RECTAL | Status: DC | PRN

## 2016-11-29 MED ORDER — ASPIRIN 81 MG PO CHEW
81.0000 mg | CHEWABLE_TABLET | Freq: Every day | ORAL | Status: DC
  Administered 2016-11-30 – 2016-12-21 (×22): 81 mg via ORAL
  Filled 2016-11-29 (×23): qty 1

## 2016-11-29 MED ORDER — OLOPATADINE HCL 0.1 % OP SOLN
1.0000 [drp] | Freq: Two times a day (BID) | OPHTHALMIC | Status: DC
  Administered 2016-11-29 – 2016-12-21 (×44): 1 [drp] via OPHTHALMIC
  Filled 2016-11-29 (×2): qty 5

## 2016-11-29 MED ORDER — ENOXAPARIN SODIUM 40 MG/0.4ML ~~LOC~~ SOLN
40.0000 mg | SUBCUTANEOUS | Status: DC
  Administered 2016-11-30 – 2016-12-20 (×21): 40 mg via SUBCUTANEOUS
  Filled 2016-11-29 (×21): qty 0.4

## 2016-11-29 MED ORDER — CHLORHEXIDINE GLUCONATE 0.12% ORAL RINSE (MEDLINE KIT)
15.0000 mL | Freq: Two times a day (BID) | OROMUCOSAL | Status: DC
  Administered 2016-12-01 – 2016-12-02 (×3): 15 mL via OROMUCOSAL

## 2016-11-29 MED ORDER — PROCHLORPERAZINE 25 MG RE SUPP
12.5000 mg | Freq: Four times a day (QID) | RECTAL | Status: DC | PRN
  Filled 2016-11-29: qty 1

## 2016-11-29 MED ORDER — POLYETHYLENE GLYCOL 3350 17 G PO PACK: 17.0000 g | PACK | Freq: Every day | ORAL | Status: DC | PRN

## 2016-11-29 MED ORDER — GUAIFENESIN-DM 100-10 MG/5ML PO SYRP: 5.0000 mL | ORAL_SOLUTION | Freq: Four times a day (QID) | ORAL | Status: DC | PRN

## 2016-11-29 MED ORDER — TICAGRELOR 90 MG PO TABS
90.0000 mg | ORAL_TABLET | Freq: Two times a day (BID) | ORAL | Status: DC
  Administered 2016-11-29 – 2016-12-21 (×44): 90 mg via ORAL
  Filled 2016-11-29 (×44): qty 1

## 2016-11-29 MED ORDER — ALUM & MAG HYDROXIDE-SIMETH 200-200-20 MG/5ML PO SUSP: 30.0000 mL | ORAL | Status: DC | PRN

## 2016-11-29 MED ORDER — TRAZODONE HCL 50 MG PO TABS
25.0000 mg | ORAL_TABLET | Freq: Every evening | ORAL | Status: DC | PRN
  Administered 2016-11-30 – 2016-12-01 (×2): 50 mg via ORAL
  Filled 2016-11-29 (×2): qty 1

## 2016-11-29 MED ORDER — DIPHENHYDRAMINE HCL 12.5 MG/5ML PO ELIX: 12.5000 mg | ORAL_SOLUTION | Freq: Four times a day (QID) | ORAL | Status: DC | PRN

## 2016-11-29 MED ORDER — ORAL CARE MOUTH RINSE
15.0000 mL | Freq: Two times a day (BID) | OROMUCOSAL | Status: DC
  Administered 2016-11-30 – 2016-12-15 (×21): 15 mL via OROMUCOSAL

## 2016-11-29 MED ORDER — FLEET ENEMA 7-19 GM/118ML RE ENEM: 1.0000 | ENEMA | Freq: Once | RECTAL | Status: DC | PRN

## 2016-11-29 MED ORDER — PROCHLORPERAZINE MALEATE 5 MG PO TABS: 5.0000 mg | ORAL_TABLET | Freq: Four times a day (QID) | ORAL | Status: DC | PRN

## 2016-11-29 NOTE — Progress Notes (Signed)
Cristina Gong, RN Rehab Admission Coordinator Signed Physical Medicine and Rehabilitation  PMR Pre-admission Date of Service: 11/28/2016 7:19 PM  Related encounter: ED to Hosp-Admission (Current) from 11/25/2016 in [place]       _0 Hide copied text PMR Admission Coordinator Pre-Admission Assessment  Patient: Jason Nolan is an 71 y.o., male MRN: [id number] DOB: 03/25/1946 Height: _1  (177.8 cm) Weight: 96.9 kg (213 lb 10 oz)                                                                                                                                                  Insurance Information HMO:     PPO:      PCP:      IPA:      80/20: yes     OTHER: no HMO PRIMARY: Medicare a and b      Policy#: [id number]      Subscriber: pt Benefits:  Phone #: passport one online     Name: 11/28/16 Eff. Date: 02/03/2013     Deduct: $1340      Out of Pocket Max: none      Life Max: none CIR: 100%      SNF: 20 full days Outpatient: 80%     Co-Pay: 20% Home Health: 100%      Co-Pay: none DME: 80%     Co-Pay: 20% Providers: pt choice  SECONDARY: [employer]      Policy#: [id number]      Subscriber: pt  Medicaid Application Date:       Case Manager:  Disability Application Date:       Case Worker:   Emergency Contact Information        Contact Information    Name Relation Home Work Mobile   Nickles-Wilson,Maxine Spouse [phone]  [phone]     Current Medical History  Patient Admitting Diagnosis: right MCA infarct  History of Present Illness: Jason Nolan a 71 y.o.left handed male with history of Hep C, glaucoma who was admitted via [facility] on 7/23 with left sided weakness, facial droop and slurred speech due to early changes of R-MCA territory infarct. He underwent CTA brain showed short segment near occlusion of proximal R-ICA with associated intraluminal thrombus --likely due to dissection and emergent large vessel occlusion with  right M2 occlusion and evolving right frontal lobe infarct. He underwent cerebral angio with complete revascularization of occluded superior division of R-MCA and near complete revascularization of proximal R-ICA with stent assisted angioplasty. MRI brain done revealing right insula and frontal parietal junction acute infarct, several punctate foci of acute infarct in right frontal lobe, right parietal lobe, right occipital lobe, and left superior cerebellar hemisphere and stable right sylvian/posterior frontal convexity extra axial signal from extravasated contrast and/or SAH.   He tolerated extubation on 7/25. 2 D echo with  EF 60-65% with no wall abnormality.  Carotid dopplers showed 40-50% right proximal ICA stenosis, L- 1-39% stenosis and elevated velocities in B-VA. MBS done and patient started on dysphagia 3, thin liquids today. Patient with resultant left sided weakness, expressive deficits with aphonia and dysphagia.   Total: 10 NIHSS  Past Medical History  Past Medical History:  Diagnosis Date  . Glaucoma   . Hepatitis C     Family History  family history includes Hypertension in his father and mother.  Prior Rehab/Hospitalizations:  Has the patient had major surgery during 100 days prior to admission? No  Current Medications   Current Facility-Administered Medications:  .  0.9 %  sodium chloride infusion, , Intravenous, Continuous, Amie Portland, MD, Last Rate: 75 mL/hr at 11/28/16 2149 .  acetaminophen (TYLENOL) tablet 650 mg, 650 mg, Oral, Q4H PRN, 650 mg at 11/28/16 2205 **OR** acetaminophen (TYLENOL) solution 650 mg, 650 mg, Per Tube, Q4H PRN, 650 mg at 11/27/16 2023 **OR** acetaminophen (TYLENOL) suppository 650 mg, 650 mg, Rectal, Q4H PRN, Deveshwar, Sanjeev, MD .  aspirin chewable tablet 81 mg, 81 mg, Oral, Daily, Deveshwar, Sanjeev, MD, 81 mg at 11/29/16 1116 .  atorvastatin (LIPITOR) tablet 40 mg, 40 mg, Oral, q1800, Patteson, Samuel A, NP, 40 mg at 11/28/16  1701 .  chlorhexidine gluconate (MEDLINE KIT) (PERIDEX) 0.12 % solution 15 mL, 15 mL, Mouth Rinse, BID, Corey Harold, NP, 15 mL at 11/29/16 0800 .  heparin injection 5,000 Units, 5,000 Units, Subcutaneous, Q8H, Amie Portland, MD, 5,000 Units at 11/29/16 0502 .  hydrALAZINE (APRESOLINE) injection 10-20 mg, 10-20 mg, Intravenous, Q4H PRN, Corey Harold, NP, 10 mg at 11/27/16 2876 .  latanoprost (XALATAN) 0.005 % ophthalmic solution 1 drop, 1 drop, Both Eyes, QHS, Corey Harold, NP, 1 drop at 11/28/16 2153 .  MEDLINE mouth rinse, 15 mL, Mouth Rinse, q12n4p, Garvin Fila, MD, 15 mL at 11/28/16 1701 .  olopatadine (PATANOL) 0.1 % ophthalmic solution 1 drop, 1 drop, Both Eyes, BID, Corey Harold, NP, 1 drop at 11/29/16 1117 .  ondansetron (ZOFRAN) injection 4 mg, 4 mg, Intravenous, Q6H PRN, Deveshwar, Sanjeev, MD .  pantoprazole (PROTONIX) EC tablet 40 mg, 40 mg, Oral, Daily, Garvin Fila, MD, 40 mg at 11/29/16 1115 .  senna-docusate (Senokot-S) tablet 1 tablet, 1 tablet, Oral, QHS PRN, Amie Portland, MD .  ticagrelor (BRILINTA) tablet 90 mg, 90 mg, Oral, BID, Deveshwar, Sanjeev, MD, 90 mg at 11/29/16 1115  Patients Current Diet: DIET DYS 3 Room service appropriate? Yes; Fluid consistency: Thin. MBS 7/26/ meds whole with liquids. Cortrak remains in place with diet begun 11/28/16  Precautions / Restrictions Precautions Precautions: Fall Precaution Comments: Permissive HTN <220/120  Restrictions Weight Bearing Restrictions: No   Has the patient had 2 or more falls or a fall with injury in the past year?No  Prior Activity Level Community (5-7x/wk): retired; driving, independent, gym three times per week and golfs. Retired to [employer] 8 years ago. Goes to gym 3 times per week, golf's a lot.   Home Assistive Devices / Equipment Home Assistive Devices/Equipment: Eyeglasses Home Equipment: Shower seat - built in  Prior Device Use: Indicate devices/aids used by the patient prior to  current illness, exacerbation or injury? None of the above  Prior Functional Level Prior Function Level of Independence: Independent Comments: retired Administrator, arts; [place] for 8 years  Self Care: Did the patient need help bathing, dressing, using the toilet or eating?  Independent  Indoor Mobility: Did  the patient need assistance with walking from room to room (with or without device)? Independent  Stairs: Did the patient need assistance with internal or external stairs (with or without device)? Independent  Functional Cognition: Did the patient need help planning regular tasks such as shopping or remembering to take medications? Independent  Current Functional Level Cognition  Arousal/Alertness: Awake/alert Overall Cognitive Status: Within Functional Limits for tasks assessed Difficult to assess due to: Impaired communication Orientation Level: Oriented X4, Other (comment) (gestures appropriately; difficult to assess d/t speech) General Comments: Significant expressive aphasia - pt unable to answer questions verbally however was answering therapist with head and hand gestures.  Attention: Sustained Sustained Attention: Appears intact    Extremity Assessment (includes Sensation/Coordination)  Upper Extremity Assessment: LUE deficits/detail LUE Deficits / Details: Grossly 3/5. Pt reports numbness in LUE (distal>proximal). Increased edema noted. LUE Sensation: decreased light touch LUE Coordination: decreased fine motor, decreased gross motor  Lower Extremity Assessment: Defer to PT evaluation LLE Deficits / Details: Grossly 4-/5 strength in hip flexors, quads, hams. Pt with good muscle tone in bilateral LE's.  LLE Sensation: decreased light touch (Pt reports numbness from foot to thigh on L)    ADLs  Overall ADL's : Needs assistance/impaired Eating/Feeding: Moderate assistance, Sitting Eating/Feeding Details (indicate cue type and reason): Pt able to bring cup to  mouth to expell secretions Grooming: Maximal assistance, Sitting Upper Body Bathing: Maximal assistance, Sitting Lower Body Bathing: Total assistance, Sit to/from stand Upper Body Dressing : Maximal assistance, Sitting Upper Body Dressing Details (indicate cue type and reason): to don gown Lower Body Dressing: Total assistance Lower Body Dressing Details (indicate cue type and reason): to don socks Toilet Transfer: Moderate assistance, +2 for physical assistance, Stand-pivot Toilet Transfer Details (indicate cue type and reason): Simulated by transfer [name] > chair Functional mobility during ADLs: Moderate assistance, +2 for physical assistance General ADL Comments: Discussed post acute rehab with pt; he is agreeable. Educated pt on protecting RUE from injury, propping on pillow for edema control and protection.    Mobility  Overal bed mobility: Needs Assistance Bed Mobility: Supine to Sit Supine to sit: Min assist General bed mobility comments: increased time, min A with L LE movement, transferring towards his R side, use of R UE on bed rail    Transfers  Overall transfer level: Needs assistance Equipment used: 2 person hand held assist Transfers: Sit to/from Stand, Stand Pivot Transfers Sit to Stand: Mod assist, +2 physical assistance Stand pivot transfers: Mod assist, +2 physical assistance General transfer comment: +2 assist to power-up to full standing position, LUE supported throughout transfer. Once standing, pt able to improve posture with cues but was unable to maintain. Pt took a few pivotal steps around to the recliner with support at gait belt and physical assist with L LE movement    Ambulation / Gait / Stairs / Wheelchair Mobility       Posture / Balance Dynamic Sitting Balance Sitting balance - Comments: pt able to sit [name] with supervision Balance Overall balance assessment: Needs assistance Sitting-balance support: Feet supported, No upper extremity  supported Sitting balance-Leahy Scale: Fair Sitting balance - Comments: pt able to sit [name] with supervision Standing balance support: Bilateral upper extremity supported, During functional activity Standing balance-Leahy Scale: Poor Standing balance comment: mod A x2    Special needs/care consideration BiPAP/CPAP  N/a CPM  N/a Continuous Drip IV n/a Dialysis  N/a Life Vest  N/a Oxygen n/a Special Bed  N/a Trach Size  N/a Wound  Vac n/a Skin intact    Cortrak clamped not in use since 11/28/16                          Bowel mgmt: no LBM documented Bladder mgmt: condom catheter Diabetic mgmt Hgb A1c 5.4 Cortrak clamped since 11/28/16 after MBS passed   Previous Home Environment Living Arrangements: Spouse/significant other  Lives With: Spouse Available Help at Discharge: Family, Available 24 hours/day Type of Home: House Home Layout: One level Home Access: Level entry Bathroom Shower/Tub: Gaffer, Door [employer] Toilet: Associate Professor Accessibility: Yes [place]: No  Discharge Living Setting Plans for Discharge Living Setting: Patient's home, Lives with (comment) Type of Home at Discharge: House Discharge Home Layout: One level Discharge Home Access: Level entry Discharge Bathroom Shower/Tub: Walk-in shower Discharge Bathroom Toilet: Standard Discharge Bathroom Accessibility: Yes How Accessible: Accessible via walker Does the patient have any problems obtaining your medications?: No  Social/Family/Support Systems Patient Roles: Spouse Contact Information: Maxine Anticipated Caregiver: wife Anticipated Ambulance person Information: see above Ability/Limitations of Caregiver: wife does not drive Caregiver Availability: 24/7 Discharge Plan Discussed with Primary Caregiver: Yes Is Caregiver In Agreement with Plan?: Yes Does Caregiver/Family have Issues with Lodging/Transportation while Pt is in Rehab?: No  Goals/Additional Needs Patient/Family  Goal for Rehab: supervision with PT, OT, and SLP Expected length of stay: [facility] 21-25 days Pt/Family Agrees to Admission and willing to participate: Yes Program Orientation Provided & Reviewed with Pt/Caregiver Including Roles  & Responsibilities: Yes  Decrease burden of Care through IP rehab admission: n/a  Possible need for SNF placement upon discharge:not anticipated  Patient Condition: This patient's condition remains as documented in the consult dated 11/28/2016, in which the Rehabilitation Physician determined and documented that the patient's condition is appropriate for intensive rehabilitative care in an inpatient rehabilitation facility. Will admit to inpatient rehab today.  Preadmission Screen Completed By:  Cleatrice Burke, 11/29/2016 12:27 PM ______________________________________________________________________   Discussed status with Dr. Naaman Plummer on 11/29/16 at  1223 and received telephone approval for admission today.  Admission Coordinator:  Cleatrice Burke, time [id number] Date 11/29/2016       Cosigned by: Meredith Staggers, MD at 11/29/2016 12:41 PM  Revision History

## 2016-11-29 NOTE — H&P (Signed)
Physical Medicine and Rehabilitation Admission H&P  Chief Complaint  Patient presents with  . Right sided weakness with aphasia.     HPI: Jason Nolan is a 71 y.o. left handed male with history of Hep C-treated with Harvoni, glaucoma who was admitted via [facility] on 7/23 with left sided weakness, facial droop and slurred speech due to early changes of R-MCA territory infarct.  He underwent CTA brain showed short segment near occlusion of proximal R-ICA with associated intraluminal thrombus --likely due to dissection and emergent large vessel occlusion with right M2 occlusion and evolving right frontal lobe infarct.  He underwent cerebral angio with  complete revascularization of occluded superior division of R-MCA and near complete revascularization of proximal R-ICA with stent assisted angioplasty. MRI brain done revealing right insula and frontal parietal junction acute infarct, several punctate foci of acute infarct in right frontal lobe, right parietal lobe, right occipital lobe, and left superior cerebellar hemisphere and stable right sylvian/posterior frontal convexity extra axial signal from extravasated contrast and/or SAH.   He tolerated extubation on 7/25. 2 D echo with EF 60-65% with no wall abnormality. Carotid dopplers showed 40-50% right proximal ICA stenosis, L- 1-39% stenosis and elevated velocities in B-VA. Stroke felt to be embolic in setting of ruptured plaque R-ICA and patient on ASA and brillinta due to stent. MBS done and patient started on dysphagia 3, thin liquids.  Patient with resultant left sided weakness, expressive deficits with aphonia and dysphagia. Therapy evaluations done 7/26 and CIR recommended due to deficits in mobility and ability to carry out ADL tasks   Review of Systems  HENT: Negative for hearing loss and tinnitus.   Eyes: Negative for blurred vision and double vision.  Respiratory: Negative for cough and sputum production.   Cardiovascular: Negative for  chest pain and palpitations.  Gastrointestinal: Positive for constipation. Negative for heartburn and nausea.  Genitourinary: Negative for dysuria and urgency.  Musculoskeletal: Positive for back pain. Negative for myalgias.  Skin: Negative for rash.  Neurological: Positive for sensory change, speech change, focal weakness and headaches. Negative for dizziness.  Psychiatric/Behavioral: The patient has insomnia.       Past Medical History:  Diagnosis Date  . Glaucoma   . Hepatitis C     Past Surgical History:  Procedure Laterality Date  . IR ANGIO INTRA EXTRACRAN SEL COM CAROTID INNOMINATE UNI L MOD SED  11/25/2016  . IR ANGIO VERTEBRAL SEL SUBCLAVIAN INNOMINATE UNI R MOD SED  11/25/2016  . IR ANGIO VERTEBRAL SEL VERTEBRAL UNI L MOD SED  11/25/2016  . IR INTRAVSC STENT CERV CAROTID W/O EMB-PROT MOD SED INC ANGIO  11/25/2016  . IR PERCUTANEOUS ART THROMBECTOMY/INFUSION INTRACRANIAL INC DIAG ANGIO  11/25/2016  . RADIOLOGY WITH ANESTHESIA N/A 11/25/2016   Procedure: RADIOLOGY WITH ANESTHESIA;  Surgeon: Radiologist, Medication, MD;  Location: [place];  Service: Radiology;  Laterality: N/A;    Family History  Problem Relation Age of Onset  . Hypertension Mother   . Hypertension Father     Social History:  Married. Retired. Indicated that wife in good health. Per reports  he quit smoking. He quit smokeless tobacco use about 23 years ago. He reports that he does not drink alcohol or use drugs.    Allergies: No Known Allergies    Medications Prior to Admission  Medication Sig Dispense Refill  . PAZEO 0.7 % SOLN Place 1 drop into both eyes daily.     Marland Kitchen VYZULTA 0.024 % SOLN Apply 1 drop to  eye 2 (two) times daily.       Home: Home Living Family/patient expects to be discharged to:: Private residence Living Arrangements: Spouse/significant other Available Help at Discharge: Family, Available 24 hours/day Type of Home: House Home Access: Level entry [place]: One level Bathroom  Shower/Tub: Gaffer, Charity fundraiser: Programmer, systems: Yes Home Equipment: Civil engineer, contracting - built in  Lives With: Spouse   Functional History: Prior Function Level of Independence: Independent Comments: retired Administrator, arts; [employer] for 8 years  Functional Status:  Mobility: [place] bed mobility: Needs Assistance Bed Mobility: Supine to Sit Supine to sit: Min assist General bed mobility comments: increased time, min A with L LE movement, transferring towards his R side, use of R UE on bed rail Transfers Overall transfer level: Needs assistance Equipment used: 2 person hand held assist Transfers: Sit to/from Stand, [employer] Transfers Sit to Stand: Mod assist, +2 physical assistance Stand pivot transfers: Mod assist, +2 physical assistance General transfer comment: +2 assist to power-up to full standing position, LUE supported throughout transfer. Once standing, pt able to improve posture with cues but was unable to maintain. Pt took a few pivotal steps around to the recliner with support at gait belt and physical assist with L LE movement      ADL: ADL Overall ADL's : Needs assistance/impaired Eating/Feeding: Moderate assistance, Sitting Eating/Feeding Details (indicate cue type and reason): Pt able to bring cup to mouth to expell secretions Grooming: Maximal assistance, Sitting Upper Body Bathing: Maximal assistance, Sitting Lower Body Bathing: Total assistance, Sit to/from stand Upper Body Dressing : Maximal assistance, Sitting Upper Body Dressing Details (indicate cue type and reason): to don gown Lower Body Dressing: Total assistance Lower Body Dressing Details (indicate cue type and reason): to don socks Toilet Transfer: Moderate assistance, +2 for physical assistance, Stand-pivot Toilet Transfer Details (indicate cue type and reason): Simulated by transfer [name] > chair Functional mobility during ADLs: Moderate assistance, +2 for  physical assistance General ADL Comments: Discussed post acute rehab with pt; he is agreeable. Educated pt on protecting RUE from injury, propping on pillow for edema control and protection.  Cognition: Cognition Overall Cognitive Status: Within Functional Limits for tasks assessed Arousal/Alertness: Awake/alert Orientation Level: Oriented X4, Other (comment) (gestures appropriately; difficult to assess d/t speech) Attention: Sustained Sustained Attention: Appears intact Cognition Arousal/Alertness: Awake/alert Behavior During Therapy: WFL for tasks assessed/performed Overall Cognitive Status: Within Functional Limits for tasks assessed General Comments: Significant expressive aphasia - pt unable to answer questions verbally however was answering therapist with head and hand gestures.  Difficult to assess due to: Impaired communication   Blood pressure (!) 142/73, pulse 70, temperature 99.7 F (37.6 C), temperature source Oral, resp. rate 18, height _0  (1.778 m), weight 96.9 kg (213 lb 10 oz), SpO2 98 %. Physical Exam  Nursing note and vitals reviewed. Constitutional: He is oriented to person, place, and time. He appears well-developed and well-nourished. No distress.  Fatigued appearing. [place] in place   HENT:  Head: Normocephalic and atraumatic.  Mouth/Throat: Oropharynx is clear and moist.  Eyes: Pupils are equal, round, and reactive to light. Conjunctivae and EOM are normal.  Crusting around left eye  Neck: Normal range of motion. Neck supple.  Cardiovascular: Normal rate and regular rhythm.   No murmur heard. Respiratory: Effort normal and breath sounds normal. No stridor.  GI: Soft. Bowel sounds are normal.  Musculoskeletal: He exhibits edema (LUE).  Neurological: He is alert and oriented to person,  place, and time.  Able to answer Y/N orientation questions without difficulty. Seech severely dysarthric. Able to make out words with extra time. Fair insight and  awareness.  Left facial weakness with tongue deviation to left. LUE 1-2/5 Deltoid and biceps, 0/5 wrist and hand. RUE 4/5. RLE 4/5. LLE: 1+ to 2/5 HF, KE and 0/5 ADF/PF. DTR's depressed. Senses gross touch left arm and leg.   Skin: Skin is warm and dry. He is not diaphoretic.  Psychiatric:  Generally pleasant and cooperative    Results for orders placed or performed during the hospital encounter of 11/25/16 (from the past 48 hour(s))  CBC     Status: Abnormal   Collection Time: 11/28/16  2:16 AM  Result Value Ref Range   WBC 4.2 4.0 - 10.5 K/uL   RBC 4.09 (L) 4.22 - 5.81 MIL/uL   Hemoglobin 12.6 (L) 13.0 - 17.0 g/dL   HCT 38.3 (L) 39.0 - 52.0 %   MCV 93.6 78.0 - 100.0 fL   MCH 30.8 26.0 - 34.0 pg   MCHC 32.9 30.0 - 36.0 g/dL   RDW 14.3 11.5 - 15.5 %   Platelets 154 150 - 400 K/uL  Basic metabolic panel     Status: Abnormal   Collection Time: 11/28/16  2:16 AM  Result Value Ref Range   Sodium 138 135 - 145 mmol/L   Potassium 3.6 3.5 - 5.1 mmol/L   Chloride 107 101 - 111 mmol/L   CO2 25 22 - 32 mmol/L   Glucose, Bld 106 (H) 65 - 99 mg/dL   BUN 8 6 - 20 mg/dL   Creatinine, Ser 1.05 0.61 - 1.24 mg/dL   Calcium 8.0 (L) 8.9 - 10.3 mg/dL   GFR calc non Af Amer >60 >60 mL/min   GFR calc Af Amer >60 >60 mL/min    Comment: (NOTE) The eGFR has been calculated using the CKD EPI equation. This calculation has not been validated in all clinical situations. eGFR's persistently <60 mL/min signify possible Chronic Kidney Disease.    Anion gap 6 5 - 15  Magnesium     Status: None   Collection Time: 11/28/16  2:16 AM  Result Value Ref Range   Magnesium 2.2 1.7 - 2.4 mg/dL  Phosphorus     Status: None   Collection Time: 11/28/16  2:16 AM  Result Value Ref Range   Phosphorus 2.9 2.5 - 4.6 mg/dL  Platelet inhibition p2y12 (Not at [facility])     Status: Abnormal   Collection Time: 11/28/16 10:22 AM  Result Value Ref Range   Platelet Function  P2Y12 52 (L) 194 - 418 PRU    Comment:        The  literature has shown a direct correlation of PRU values over 230 with higher risks of thrombotic events.  Lower PRU values are associated with platelet inhibition.    Dg Abd Portable 1v  Result Date: 11/27/2016 CLINICAL DATA:  Feeding tube placement EXAM: PORTABLE ABDOMEN - 1 VIEW COMPARISON:  None. FINDINGS: Feeding tube tip is in the distal stomach. Nonobstructive bowel gas pattern with mild gaseous distention of bowel diffusely. IMPRESSION: Feeding tube tip in the distal stomach. Electronically Signed   By: Rolm Baptise M.D.   On: 11/27/2016 14:39   Dg Swallowing Func-speech Pathology  Result Date: 11/28/2016 Objective Swallowing Evaluation: Type of Study: MBS-Modified Barium Swallow Study Patient Details Name: Jason Nolan MRN: [id number] Date of Birth: 02/01/1946 Today's Date: 11/28/2016 Time: SLP Start Time (ACUTE ONLY): 1120-SLP Stop  Time (ACUTE ONLY): 1240 SLP Time Calculation (min) (ACUTE ONLY): 80 min Past Medical History: Past Medical History: Diagnosis Date . Glaucoma  . Hepatitis C  Past Surgical History: Past Surgical History: Procedure Laterality Date . IR ANGIO INTRA EXTRACRAN SEL COM CAROTID INNOMINATE UNI L MOD SED  11/25/2016 . IR ANGIO VERTEBRAL SEL SUBCLAVIAN INNOMINATE UNI R MOD SED  11/25/2016 . IR ANGIO VERTEBRAL SEL VERTEBRAL UNI L MOD SED  11/25/2016 . IR INTRAVSC STENT CERV CAROTID W/O EMB-PROT MOD SED INC ANGIO  11/25/2016 . IR PERCUTANEOUS ART THROMBECTOMY/INFUSION INTRACRANIAL INC DIAG ANGIO  11/25/2016 . RADIOLOGY WITH ANESTHESIA N/A 11/25/2016  Procedure: RADIOLOGY WITH ANESTHESIA;  Surgeon: Radiologist, Medication, MD;  Location: [place];  Service: Radiology;  Laterality: N/A; HPI: 71 year old male with PMH as below, which is significant for Hepatitic C s/p treatment with Harvoni. He was last known normal at 2am on 7/23 when he went to sleep. He woke up around 9am with left sided weakness, facial droop, and slurred speech. He presented to [facility] where he was found to have a R MCA  CVA, but was outside window for tpa. He was transferred to [facility] for IR evaluation. He underwent successful revascularization of R MCA and R ICA. Post-procedurally he remained on ventilator and was transferred to ICU. Concern for potential hemorrhage post IR proedure, but may have been extravasation of dye and not a bleed. Intubated 7/23 to 7/25 in am. 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 Pt is left handed.  No Data Recorded Assessment / Plan / Recommendation CHL IP CLINICAL IMPRESSIONS 11/28/2016 Clinical Impression Despite significant left lingual weakness and left labial/facial sensory loss, pt is able to tolerate thin liquids and masticate regular solids without oral residuals. There is mild anterior spillage, but no retained barium in the buccal cavity. Oropharyngeal phase WNL. Primary concern is pt biting his tongue during mastication, possibly without awareness. Recommend pt attempt a dys 3 mechanical soft diet with thin liquids, but will downgrade texture as needed if he cannot manage his tongue. May also need to attempt compensatory strategies for anterior loss. Will follow for tolerance.  SLP Visit Diagnosis Dysphagia, oral phase (R13.11) Attention and concentration deficit following -- Frontal lobe and executive function deficit following -- Impact on safety and function Mild aspiration risk   CHL IP TREATMENT RECOMMENDATION 11/28/2016 Treatment Recommendations Therapy as outlined in treatment plan below   Prognosis 11/28/2016 Prognosis for Safe Diet Advancement Good Barriers to Reach Goals -- Barriers/Prognosis Comment -- CHL IP DIET RECOMMENDATION 11/28/2016 SLP Diet Recommendations Dysphagia 3 (Mech soft) solids;Thin liquid Liquid Administration via Cup;Straw Medication Administration Whole meds with liquid Compensations Other (Comment) Postural Changes Remain semi-upright after after feeds/meals (Comment);Seated upright at 90 degrees   CHL IP OTHER RECOMMENDATIONS  11/28/2016 Recommended Consults -- Oral Care Recommendations Oral care BID Other Recommendations --   CHL IP FOLLOW UP RECOMMENDATIONS 11/28/2016 Follow up Recommendations Inpatient Rehab   CHL IP FREQUENCY AND DURATION 11/28/2016 Speech Therapy Frequency (ACUTE ONLY) min 2x/week Treatment Duration 2 weeks      CHL IP ORAL PHASE 11/28/2016 Oral Phase Impaired Oral - Pudding Teaspoon -- Oral - Pudding Cup -- Oral - Honey Teaspoon -- Oral - Honey Cup -- Oral - Nectar Teaspoon -- Oral - Nectar Cup -- Oral - Nectar Straw -- Oral - Thin Teaspoon -- Oral - Thin Cup Left anterior bolus loss Oral - Thin Straw Left anterior bolus loss Oral - Puree WFL Oral - Mech Soft --  Oral - Regular WFL Oral - Multi-Consistency -- Oral - Pill WFL Oral Phase - Comment --  CHL IP PHARYNGEAL PHASE 11/28/2016 Pharyngeal Phase WFL Pharyngeal- Pudding Teaspoon -- Pharyngeal -- Pharyngeal- Pudding Cup -- Pharyngeal -- Pharyngeal- Honey Teaspoon -- Pharyngeal -- Pharyngeal- Honey Cup -- Pharyngeal -- Pharyngeal- Nectar Teaspoon -- Pharyngeal -- Pharyngeal- Nectar Cup -- Pharyngeal -- Pharyngeal- Nectar Straw -- Pharyngeal -- Pharyngeal- Thin Teaspoon -- Pharyngeal -- Pharyngeal- Thin Cup -- Pharyngeal -- Pharyngeal- Thin Straw -- Pharyngeal -- Pharyngeal- Puree -- Pharyngeal -- Pharyngeal- Mechanical Soft -- Pharyngeal -- Pharyngeal- Regular -- Pharyngeal -- Pharyngeal- Multi-consistency -- Pharyngeal -- Pharyngeal- Pill -- Pharyngeal -- Pharyngeal Comment --  No flowsheet data found. No flowsheet data found. Herbie [place], [place] [phone] Lynann Beaver 11/28/2016, 12:20 PM                  Medical Problem List and Plan: 1.  Left hemiparesis and speech/swallowing difficulties secondary to embolic right MCA infarct  -admit to inpatient rehab 2.  DVT Prophylaxis/Anticoagulation: Pharmaceutical: Lovenox 3. Pain Management: tylenol prn  4. Mood: LCSW to follow for evaluation and support. Team to provide ego support--showing signs  of adjustment reaction. 5. Neuropsych: This patient appears to be capable of making decisions on his own behalf. 6. Skin/Wound Care: routine pressure relief measures.  7. Fluids/Electrolytes/Nutrition: Monitor I/O. Check lytes in am.  8. Glaucoma: Continue Xalatan as at home.  9. ABLA: Recheck in am. 10. Elevated triglycerides: Now on Lipitor.  11. HTN: permissive HTN for 5-7 days for adequate perfusion with long term goal of normotensive BPs.  12. Dysphagia: pt on D3 diet per acute SLP recs: pt very dysarthric with poor oro-motor control. I have reviewed report from acute. We will stay with current diet watching closely for any signs or symptoms of aspiration. Aspiration precautions    Post Admission Physician Evaluation: 1. Functional deficits secondary  to right MCA infarct. 2. Patient is admitted to receive collaborative, interdisciplinary care between the physiatrist, rehab nursing staff, and therapy team. 3. Patient's level of medical complexity and substantial therapy needs in context of that medical necessity cannot be provided at a lesser intensity of care such as a SNF. 4. Patient has experienced substantial functional loss from his/her baseline which was documented above under the "Functional History" and "Functional Status" headings.  Judging by the patient's diagnosis, physical exam, and functional history, the patient has potential for functional progress which will result in measurable gains while on inpatient rehab.  These gains will be of substantial and practical use upon discharge  in facilitating mobility and self-care at the household level. 5. Physiatrist will provide 24 hour management of medical needs as well as oversight of the therapy plan/treatment and provide guidance as appropriate regarding the interaction of the two. 6. The Preadmission Screening has been reviewed and patient status is unchanged unless otherwise stated above. 7. 24 hour rehab nursing will assist with  bladder management, bowel management, safety, skin/wound care, disease management, medication administration, pain management and patient education  and help integrate therapy concepts, techniques,education, etc. 8. PT will assess and treat for/with: Lower extremity strength, range of motion, stamina, balance, functional mobility, safety, adaptive techniques and equipment, NMR, family ed, ego support.   Goals are: supervision. 9. OT will assess and treat for/with: ADL's, functional mobility, safety, upper extremity strength, adaptive techniques and equipment, NMR, family ed.   Goals are: supervision to min assist. Therapy may proceed with showering this patient. 10. SLP will  assess and treat for/with: cognition, speech, communication, language, family ed.  Goals are: supervision to min assist. 11. Case Management and Social Worker will assess and treat for psychological issues and discharge planning. 12. Team conference will be held weekly to assess progress toward goals and to determine barriers to discharge. 13. Patient will receive at least 3 hours of therapy per day at least 5 days per week. 14. ELOS: 21-25 days       15. Prognosis:  good     Meredith Staggers, MD, [place] [facility] 11/29/2016  Bary Leriche, Hershal Coria 11/29/2016

## 2016-11-29 NOTE — Progress Notes (Signed)
Erick Colace, MD Physician Signed Physical Medicine and Rehabilitation  Consult Note Date of Service: 11/28/2016 3:54 PM  Related encounter: ED to Hosp-Admission (Current) from 11/25/2016 in [facility]     Expand All Collapse All   [] Hide copied text      Physical Medicine and Rehabilitation Consult   Reason for Consult:  Right sided weakness and difficulty speaking due to stroke.  Referring Physician: Dr. Pearlean Brownie   HPI: Jason Nolan is a 71 y.o. left handed male with history of Hep C, glaucoma who was admitted via [facility] on 7/23 with left sided weakness, facial droop and slurred speech due to early changes of R-MCA territory infarct.  He underwent CTA brain showed short segment near occlusion of proximal R-ICA with associated intraluminal thrombus --likely due to dissection and emergent large vessel occlusion with right M2 occlusion and evolving right frontal lobe infarct.  He underwent cerebral angio with  complete revascularization of occluded superior division of R-MCA and near complete revascularization of proximal R-ICA with stent assisted angioplasty. MRI brain done revealing right insula and frontal parietal junction acute infarct, several punctate foci of acute infarct in right frontal lobe, right parietal lobe, right occipital lobe, and left superior cerebellar hemisphere and stable right sylvian/posterior frontal convexity extra axial signal from extravasated contrast and/or SAH.   He tolerated extubation on 7/25. 2 D echo with EF 60-65% with no wall abnormality.  Carotid dopplers showed 40-50% right proximal ICA stenosis, L- 1-39% stenosis and elevated velocities in B-VA. MBS done and patient started on dysphagia 3, thin liquids today. Patient with resultant left sided weakness, expressive deficits with aphonia and dysphagia. Therapy evaluations done today and CIR recommended due to deficits in mobility and ability to carry out  ADL tasks.    Review of Systems  HENT: Negative for hearing loss and tinnitus.   Eyes: Negative for blurred vision and double vision.  Respiratory: Negative for cough and shortness of breath.   Cardiovascular: Negative for chest pain and palpitations.  Gastrointestinal: Negative for abdominal pain, heartburn and nausea.  Genitourinary: Negative for dysuria and urgency.  Musculoskeletal: Negative for back pain, joint pain, myalgias and neck pain.  Skin: Negative for rash.  Neurological: Positive for sensory change, speech change, focal weakness and weakness.       Past Medical History:  Diagnosis Date  . Glaucoma   . Hepatitis C          Past Surgical History:  Procedure Laterality Date  . IR ANGIO INTRA EXTRACRAN SEL COM CAROTID INNOMINATE UNI L MOD SED  11/25/2016  . IR ANGIO VERTEBRAL SEL SUBCLAVIAN INNOMINATE UNI R MOD SED  11/25/2016  . IR ANGIO VERTEBRAL SEL VERTEBRAL UNI L MOD SED  11/25/2016  . IR INTRAVSC STENT CERV CAROTID W/O EMB-PROT MOD SED INC ANGIO  11/25/2016  . IR PERCUTANEOUS ART THROMBECTOMY/INFUSION INTRACRANIAL INC DIAG ANGIO  11/25/2016  . RADIOLOGY WITH ANESTHESIA N/A 11/25/2016   Procedure: RADIOLOGY WITH ANESTHESIA;  Surgeon: Radiologist, Medication, MD;  Location: [facility] OR;  Service: Radiology;  Laterality: N/A;         Family History  Problem Relation Age of Onset  . Hypertension Mother   . Hypertension Father     Social History:  Married. Retired. Indicated that wife in good health. Per reports  he quit smoking. He quit smokeless tobacco use about 23 years ago. He reports that he does not drink alcohol or use drugs.    Allergies: No  Known Allergies          Medications Prior to Admission  Medication Sig Dispense Refill  . PAZEO 0.7 % SOLN Place 1 drop into both eyes daily.     Marland Kitchen. VYZULTA 0.024 % SOLN Apply 1 drop to eye 2 (two) times daily.       Home: Home Living Family/patient expects to be discharged to:: (P)  Private residence Living Arrangements: (P) Spouse/significant other Available Help at Discharge: (P) Family, Available 24 hours/day Type of Home: (P) House Home Access: (P) Level entry Home Layout: (P) One level Bathroom Shower/Tub: (P) Walk-in shower, Door Foot LockerBathroom Toilet: (P) Standard Bathroom Accessibility: (P) Yes Home Equipment: (P) Shower seat - built in  Functional History: Prior Function Level of Independence: (P) Independent Comments: (P) Pt is retired. States he exercises Functional Status:  Mobility: Bed Mobility Overal bed mobility: (P) Needs Assistance Bed Mobility: (P) Supine to Sit Supine to sit: (P) Min assist, +2 for physical assistance General bed mobility comments: (P) Assist for LE advancement to [name], as well as for trunk elevation to full sitting. VC's for railing use with RUE.  Transfers Overall transfer level: (P) Needs assistance Equipment used: (P) 2 person hand held assist Transfers: (P) Sit to/from Stand, Stand Pivot Transfers Sit to Stand: (P) Mod assist, +2 physical assistance Stand pivot transfers: (P) Mod assist, +2 physical assistance General transfer comment: (P) +2 assist to power-up to full standing position. L knee blocked and LUE supported throughout transfer. Once standing, pt able to improve posture with cues but was unable to maintain. Pt took a few pivotal steps around to the recliner with support at gait belt and assist for weight shift.   ADL: ADL Overall ADL's : (P) Needs assistance/impaired  Cognition: Cognition Overall Cognitive Status: (P) Within Functional Limits for tasks assessed Arousal/Alertness: Awake/alert Orientation Level: Oriented X4, Other (comment) (nods and gestures appropriately) Attention: Sustained Sustained Attention: Appears intact Cognition Arousal/Alertness: (P) Awake/alert Behavior During Therapy: (P) WFL for tasks assessed/performed Overall Cognitive Status: (P) Within Functional Limits for tasks  assessed General Comments: (P) Significant expressive aphasia - pt unable to answer questions verbally however was answering therapist with head and hand gestures.  Difficult to assess due to: (P) Impaired communication   Blood pressure (!) 154/66, pulse 70, temperature 98.9 F (37.2 C), temperature source Oral, resp. rate 17, height 5\' 10"  (1.778 m), weight 96.9 kg (213 lb 10 oz), SpO2 100 %. Physical Exam  Nursing note and vitals reviewed. Constitutional: He is oriented to person, place, and time. He appears well-developed and well-nourished.  HENT:  Head: Normocephalic and atraumatic.  Mouth/Throat: Oropharynx is clear and moist.  Eyes: Pupils are equal, round, and reactive to light. Conjunctivae and EOM are normal.  Neck: Normal range of motion. Neck supple.  Cardiovascular: Normal rate and regular rhythm.   No murmur heard. Respiratory: Effort normal and breath sounds normal. No stridor.  GI: Soft. Bowel sounds are normal. He exhibits no distension. There is no tenderness.  Musculoskeletal: He exhibits no edema or tenderness.  Neurological: He is alert and oriented to person, place, and time.  Fatigued appearing. Expressive aphasia with ability to make a couple of sounds. Able to follow one and two step commands without difficulty. Able to point.   Skin: Skin is warm and dry.  Motor strength is 5/5 in the right deltoid, bicep, tricep, grip, hip flexor, knee extensor, ankle dorsiflexor. Left upper extremity 0/5 in the deltoid, bicep, tricep, finger flexors and extensors. Left lower  extremity has 2 minus, foot inversion, eversion, trace extension. Sensation difficult to assess secondary to aphasia. Extraocular muscles intact  Lab Results Last 24 Hours       Results for orders placed or performed during the hospital encounter of 11/25/16 (from the past 24 hour(s))  CBC     Status: Abnormal   Collection Time: 11/28/16  2:16 AM  Result Value Ref Range   WBC 4.2 4.0 - 10.5 K/uL    RBC 4.09 (L) 4.22 - 5.81 MIL/uL   Hemoglobin 12.6 (L) 13.0 - 17.0 g/dL   HCT 16.138.3 (L) 09.639.0 - 04.552.0 %   MCV 93.6 78.0 - 100.0 fL   MCH 30.8 26.0 - 34.0 pg   MCHC 32.9 30.0 - 36.0 g/dL   RDW 40.914.3 81.111.5 - 91.415.5 %   Platelets 154 150 - 400 K/uL  Basic metabolic panel     Status: Abnormal   Collection Time: 11/28/16  2:16 AM  Result Value Ref Range   Sodium 138 135 - 145 mmol/L   Potassium 3.6 3.5 - 5.1 mmol/L   Chloride 107 101 - 111 mmol/L   CO2 25 22 - 32 mmol/L   Glucose, Bld 106 (H) 65 - 99 mg/dL   BUN 8 6 - 20 mg/dL   Creatinine, Ser 7.821.05 0.61 - 1.24 mg/dL   Calcium 8.0 (L) 8.9 - 10.3 mg/dL   GFR calc non Af Amer >60 >60 mL/min   GFR calc Af Amer >60 >60 mL/min   Anion gap 6 5 - 15  Magnesium     Status: None   Collection Time: 11/28/16  2:16 AM  Result Value Ref Range   Magnesium 2.2 1.7 - 2.4 mg/dL  Phosphorus     Status: None   Collection Time: 11/28/16  2:16 AM  Result Value Ref Range   Phosphorus 2.9 2.5 - 4.6 mg/dL  Platelet inhibition [id number] (Not at [facility])     Status: Abnormal   Collection Time: 11/28/16 10:22 AM  Result Value Ref Range   Platelet Function  P2Y12 52 (L) 194 - 418 PRU      Imaging Results (Last 48 hours)  Mr Brain Wo Contrast  Result Date: 11/26/2016 CLINICAL DATA:  71 y/o M; clot retrieval and carotid stent yesterday, evaluate extent of stroke. EXAM: MRI HEAD WITHOUT CONTRAST TECHNIQUE: Multiplanar, multiecho pulse sequences of the brain and surrounding structures were obtained without intravenous contrast. COMPARISON:  11/25/2016 CT of the head and CT angiogram of the head. FINDINGS: Brain: Region of reduced diffusion compatible with acute infarction measures 3.9 x 4.1 x 4.7 cm (volume = 39 cm^3) (AP x ML x CC, series 300, image 33 and series 400, image 17). The infarct is centered in the right frontoparietal junction and insula. There are a few additional punctate scattered foci throughout the right MCA distribution, within  the right occipital lobe, and within the left superior cerebellar hemisphere. Areas of infarction demonstrate associated T2 hyperintense signal abnormality and minimal local mass effect. Incomplete FLAIR suppression of sulci with minimal SWI hypointensity within the right sylvian fissure and overlying the right posterior frontal lobe infarction which may represent a combination of extravasated contrast and/or hemorrhage as seen on prior CT of the head. The distribution is stable from prior CT. No extra-axial collection, hydrocephalus, or herniation. Vascular: Normal central flow voids. Skull and upper cervical spine: Nonspecific low signal focus within the right parietal bone near the vertex measuring 20 mm (series 9, image 91) with sclerosis on CT, probably  a bone island. Sinuses/Orbits: Moderate diffuse paranasal sinus mucosal thickening with small fluid levels in the sphenoid sinuses. Trace mastoid effusions. Paranasal sinus disease and mastoid effusions are probably due to intubation. Other: None. IMPRESSION: 1. Right insula and frontal parietal junction acute infarction with volume of 39 cc. Several additional punctate foci of acute infarction are present scattered throughout the right frontal lobe, right parietal lobe, right occipital lobe, and left superior cerebellar hemisphere. 2. Stable right sylvian and posterior frontal convexity extra-axial signal abnormality from extravasated contrast and/or subarachnoid hemorrhage in comparison with prior CT given differences in technique. Electronically Signed   By: Mitzi Hansen M.D.   On: 11/26/2016 22:29   Dg Chest Port 1 View  Result Date: 11/27/2016 CLINICAL DATA:  Acute CVA, intubated patient EXAM: PORTABLE CHEST 1 VIEW COMPARISON:  Portable chest x-ray of November 25, 2016 FINDINGS: The lungs are adequately inflated. There is subsegmental atelectasis in the perihilar regions greatest on the left. There is no alveolar infiltrate. There is no  pleural effusion or pneumothorax. The heart and pulmonary vascularity are normal. The endotracheal tube tip lies approximately 4.8 cm above the carina. The esophagogastric tube tip and proximal port project below the inferior margin of the image. IMPRESSION: Perihilar subsegmental atelectasis. No pneumonia nor pulmonary edema. The support tubes are in reasonable position. Electronically Signed   By: David  [place] M.D.   On: 11/27/2016 07:22   Dg Abd Portable 1v  Result Date: 11/27/2016 CLINICAL DATA:  Feeding tube placement EXAM: PORTABLE ABDOMEN - 1 VIEW COMPARISON:  None. FINDINGS: Feeding tube tip is in the distal stomach. Nonobstructive bowel gas pattern with mild gaseous distention of bowel diffusely. IMPRESSION: Feeding tube tip in the distal stomach. Electronically Signed   By: Charlett Nose M.D.   On: 11/27/2016 14:39   Dg Swallowing Func-speech Pathology  Result Date: 11/28/2016 Objective Swallowing Evaluation: Type of Study: MBS-Modified Barium Swallow Study Patient Details Name: Jhovani Griswold MRN: [id number] Date of Birth: 18-Feb-1946 Today's Date: 11/28/2016 Time: SLP Start Time (ACUTE ONLY): 1120-SLP Stop Time (ACUTE ONLY): 1240 SLP Time Calculation (min) (ACUTE ONLY): 80 min Past Medical History: Past Medical History: Diagnosis Date . Glaucoma  . Hepatitis C  Past Surgical History: Past Surgical History: Procedure Laterality Date . IR ANGIO INTRA EXTRACRAN SEL COM CAROTID INNOMINATE UNI L MOD SED  11/25/2016 . IR ANGIO VERTEBRAL SEL SUBCLAVIAN INNOMINATE UNI R MOD SED  11/25/2016 . IR ANGIO VERTEBRAL SEL VERTEBRAL UNI L MOD SED  11/25/2016 . IR INTRAVSC STENT CERV CAROTID W/O EMB-PROT MOD SED INC ANGIO  11/25/2016 . IR PERCUTANEOUS ART THROMBECTOMY/INFUSION INTRACRANIAL INC DIAG ANGIO  11/25/2016 . RADIOLOGY WITH ANESTHESIA N/A 11/25/2016  Procedure: RADIOLOGY WITH ANESTHESIA;  Surgeon: Radiologist, Medication, MD;  Location: [facility] OR;  Service: Radiology;  Laterality: N/A; HPI: 71 year old male with  PMH as below, which is significant for Hepatitic C s/p treatment with Harvoni. He was last known normal at 2am on 7/23 when he went to sleep. He woke up around 9am with left sided weakness, facial droop, and slurred speech. He presented to [facility] where he was found to have a R MCA CVA, but was outside window for tpa. He was transferred to [facility] for IR evaluation. He underwent successful revascularization of R MCA and R ICA. Post-procedurally he remained on ventilator and was transferred to ICU. Concern for potential hemorrhage post IR proedure, but may have been extravasation of dye and not a bleed. Intubated 7/23 to 7/25 in am. MRI shows Right  insula and frontal parietal junction acute infarction. Pt is left handed.  No Data Recorded Assessment / Plan / Recommendation CHL IP CLINICAL IMPRESSIONS 11/28/2016 Clinical Impression Despite significant left lingual weakness and left labial/facial sensory loss, pt is able to tolerate thin liquids and masticate regular solids without oral residuals. There is mild anterior spillage, but no retained barium in the buccal cavity. Oropharyngeal phase WNL. Primary concern is pt biting his tongue during mastication, possibly without awareness. Recommend pt attempt a dys 3 mechanical soft diet with thin liquids, but will downgrade texture as needed if he cannot manage his tongue. May also need to attempt compensatory strategies for anterior loss. Will follow for tolerance.  SLP Visit Diagnosis Dysphagia, oral phase (R13.11) Attention and concentration deficit following -- Frontal lobe and executive function deficit following -- Impact on safety and function Mild aspiration risk   CHL IP TREATMENT RECOMMENDATION 11/28/2016 Treatment Recommendations Therapy as outlined in treatment plan below   Prognosis 11/28/2016 Prognosis for Safe Diet Advancement Good Barriers to Reach Goals -- Barriers/Prognosis Comment -- CHL IP DIET RECOMMENDATION 11/28/2016 SLP Diet Recommendations Dysphagia 3 (Mech  soft) solids;Thin liquid Liquid Administration via Cup;Straw Medication Administration Whole meds with liquid Compensations Other (Comment) Postural Changes Remain semi-upright after after feeds/meals (Comment);Seated upright at 90 degrees   CHL IP OTHER RECOMMENDATIONS 11/28/2016 Recommended Consults -- Oral Care Recommendations Oral care BID Other Recommendations --   CHL IP FOLLOW UP RECOMMENDATIONS 11/28/2016 Follow up Recommendations Inpatient Rehab   CHL IP FREQUENCY AND DURATION 11/28/2016 Speech Therapy Frequency (ACUTE ONLY) min 2x/week Treatment Duration 2 weeks      CHL IP ORAL PHASE 11/28/2016 Oral Phase Impaired Oral - Pudding Teaspoon -- Oral - Pudding Cup -- Oral - Honey Teaspoon -- Oral - Honey Cup -- Oral - Nectar Teaspoon -- Oral - Nectar Cup -- Oral - Nectar Straw -- Oral - Thin Teaspoon -- Oral - Thin Cup Left anterior bolus loss Oral - Thin Straw Left anterior bolus loss Oral - Puree WFL Oral - Mech Soft -- Oral - Regular WFL Oral - Multi-Consistency -- Oral - Pill WFL Oral Phase - Comment --  CHL IP PHARYNGEAL PHASE 11/28/2016 Pharyngeal Phase WFL Pharyngeal- Pudding Teaspoon -- Pharyngeal -- Pharyngeal- Pudding Cup -- Pharyngeal -- Pharyngeal- Honey Teaspoon -- Pharyngeal -- Pharyngeal- Honey Cup -- Pharyngeal -- Pharyngeal- Nectar Teaspoon -- Pharyngeal -- Pharyngeal- Nectar Cup -- Pharyngeal -- Pharyngeal- Nectar Straw -- Pharyngeal -- Pharyngeal- Thin Teaspoon -- Pharyngeal -- Pharyngeal- Thin Cup -- Pharyngeal -- Pharyngeal- Thin Straw -- Pharyngeal -- Pharyngeal- Puree -- Pharyngeal -- Pharyngeal- Mechanical Soft -- Pharyngeal -- Pharyngeal- Regular -- Pharyngeal -- Pharyngeal- Multi-consistency -- Pharyngeal -- Pharyngeal- Pill -- Pharyngeal -- Pharyngeal Comment --  No flowsheet data found. No flowsheet data found. Harlon Ditty, MA CCC-SLP [phone] [place], Riley Nearing 11/28/2016, 12:20 PM                Assessment/Plan: Diagnosis: Left hemiparesis, aphasia, as well as dysphagia  secondary to right MCA infarct 1. Does the need for close, 24 hr/day medical supervision in concert with the patient's rehab needs make it unreasonable for this patient to be served in a less intensive setting? Yes 2. Co-Morbidities requiring supervision/potential complications: Hep C, glaucoma, at risk for aspiration pneumonia 3. Due to bladder management, bowel management, safety, skin/wound care, disease management, medication administration, pain management and patient education, does the patient require 24 hr/day rehab nursing? Yes 4. Does the patient require coordinated care of a physician, rehab nurse,  PT (1-2 hrs/day, 5 days/week), OT (1-2 hrs/day, 5 days/week) and SLP (.5-1 hrs/day, 5 days/week) to address physical and functional deficits in the context of the above medical diagnosis(es)? Yes Addressing deficits in the following areas: balance, endurance, locomotion, strength, transferring, bowel/bladder control, bathing, dressing, feeding, grooming, toileting, cognition, speech, language, swallowing and psychosocial support 5. Can the patient actively participate in an intensive therapy program of at least 3 hrs of therapy per day at least 5 days per week? Yes 6. The potential for patient to make measurable gains while on inpatient rehab is excellent 7. Anticipated functional outcomes upon discharge from inpatient rehab are supervision  with PT, supervision with OT, supervision with SLP. 8. Estimated rehab length of stay to reach the above functional goals is: 21-25d 9. Anticipated D/C setting: Home 10. Anticipated post D/C treatments: HH therapy 11. Overall Rehab/Functional Prognosis: excellent  RECOMMENDATIONS: This patient's condition is appropriate for continued rehabilitative care in the following setting: CIR Patient has agreed to participate in recommended program. Yes Note that insurance prior authorization may be required for reimbursement for recommended care.  Comment:    Erick Colace M.D. [facility] FAAPM&R (Sports Med, Neuromuscular Med) Diplomate Am Board of Electrodiagnostic Med  Jerene Pitch 11/28/2016    Revision History                        Routing History

## 2016-11-29 NOTE — Discharge Summary (Signed)
Stroke Discharge Summary  Patient ID: Jason Nolan   MRN: [id number]      DOB: [birth year]  Date of Admission: 11/25/2016 Date of Discharge: 11/29/2016  Attending Physician:  Garvin Fila, MD, Stroke MD Consultant(s):   Rehabilitation Medicine Patient's PCP:  Jearld Fenton, NP  Discharge Diagnoses: Principal Problem:   Acute ischemic stroke [facility]) - acute right middle cerebral artery distribution stroke, likely embolic in setting of ruptured plaque in R ICA with near occlusive stenosis with associated intraluminal dissection and thrombus status post  proximalright  ICA angioplasty and stenting with thrombectomy of the right superior division middle cerebral artery using solitaire device  Active Problems:   Cerebral embolism with cerebral infarction  Past Medical History:  Diagnosis Date  . Glaucoma   . Hepatitis C    Past Surgical History:  Procedure Laterality Date  . IR ANGIO INTRA EXTRACRAN SEL COM CAROTID INNOMINATE UNI L MOD SED  11/25/2016  . IR ANGIO VERTEBRAL SEL SUBCLAVIAN INNOMINATE UNI R MOD SED  11/25/2016  . IR ANGIO VERTEBRAL SEL VERTEBRAL UNI L MOD SED  11/25/2016  . IR INTRAVSC STENT CERV CAROTID W/O EMB-PROT MOD SED INC ANGIO  11/25/2016  . IR PERCUTANEOUS ART THROMBECTOMY/INFUSION INTRACRANIAL INC DIAG ANGIO  11/25/2016  . RADIOLOGY WITH ANESTHESIA N/A 11/25/2016   Procedure: RADIOLOGY WITH ANESTHESIA;  Surgeon: Radiologist, Medication, MD;  Location: [facility];  Service: Radiology;  Laterality: N/A;    Medications to be continued on Rehab . aspirin  81 mg Oral Daily  . atorvastatin  40 mg Oral q1800  . chlorhexidine gluconate (MEDLINE KIT)  15 mL Mouth Rinse BID  . latanoprost  1 drop Both Eyes QHS  . mouth rinse  15 mL Mouth Rinse q12n4p  . olopatadine  1 drop Both Eyes BID  . ticagrelor  90 mg Oral BID    LABORATORY STUDIES CBC    Component Value Date/Time   WBC 4.2 11/28/2016 0216   RBC 4.09 (L) 11/28/2016 0216   HGB 12.6 (L) 11/28/2016 0216   HCT  38.3 (L) 11/28/2016 0216   PLT 154 11/28/2016 0216   MCV 93.6 11/28/2016 0216   MCH 30.8 11/28/2016 0216   MCHC 32.9 11/28/2016 0216   RDW 14.3 11/28/2016 0216   LYMPHSABS 1.3 11/26/2016 0420   MONOABS 0.2 11/26/2016 0420   EOSABS 0.1 11/26/2016 0420   BASOSABS 0.0 11/26/2016 0420   CMP    Component Value Date/Time   NA 138 11/28/2016 0216   K 3.6 11/28/2016 0216   CL 107 11/28/2016 0216   CO2 25 11/28/2016 0216   GLUCOSE 106 (H) 11/28/2016 0216   BUN 8 11/28/2016 0216   CREATININE 1.05 11/28/2016 0216   CALCIUM 8.0 (L) 11/28/2016 0216   PROT 7.6 11/25/2016 1347   ALBUMIN 4.4 11/25/2016 1347   AST 25 11/25/2016 1347   ALT 23 11/25/2016 1347   ALKPHOS 52 11/25/2016 1347   BILITOT 0.8 11/25/2016 1347   GFRNONAA >60 11/28/2016 0216   GFRAA >60 11/28/2016 0216   COAGS Lab Results  Component Value Date   INR 1.04 11/25/2016   Lipid Panel    Component Value Date/Time   CHOL 193 11/26/2016 0420   TRIG 197 (H) 11/26/2016 0420   HDL 57 11/26/2016 0420   CHOLHDL 3.4 11/26/2016 0420   VLDL 39 11/26/2016 0420   LDLCALC 97 11/26/2016 0420   HgbA1C  Lab Results  Component Value Date   HGBA1C 5.4 11/26/2016   Urinalysis  No results found for: COLORURINE, APPEARANCEUR, LABSPEC, PHURINE, GLUCOSEU, HGBUR, BILIRUBINUR, KETONESUR, PROTEINUR, UROBILINOGEN, NITRITE, LEUKOCYTESUR Urine Drug Screen No results found for: LABOPIA, COCAINSCRNUR, LABBENZ, AMPHETMU, THCU, LABBARB  Alcohol Level No results found for: [facility]   SIGNIFICANT DIAGNOSTIC STUDIES  Ct Angio Head W Or Wo Contrast Ct Angio Neck W Or Wo Contrast 11/25/2016 IMPRESSION: 1. 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 Right ICA somewhat diffusely narrow and irregular distally to the skullbase. 2. Positive CTA for emergent large vessel occlusion with right M2 occlusion and evolving  right frontal lobe infarct involving the right insula and frontal operculum. Moderate surrounding penumbra as above. 3. Additional mild for age atherosclerotic changes above. No other high-grade or correctable stenosis.  Ct Head Wo Contrast  11/25/2016 IMPRESSION: 1. Serpiginous subarachnoid hyperdensity within the right sylvian fissure and overlying right frontal lobe, most consistent with contrast leakage from recent catheter directed arteriogram. Superimposed hemorrhage not entirely excluded. Attention at follow-up recommended. 2. Evolving acute right MCA territory infarct.  Ct Head Wo Contrast 11/25/2016 IMPRESSION: 1. 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 Hyperdense right middle cerebral artery indicates acute or subacute occlusion. 2. No associated hemorrhage, hematoma, mass effect or midline shift at this time.  Ct Cerebral Perfusion W Contrast  11/25/2016 IMPRESSION: 1. 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 Right ICA somewhat diffusely narrow and irregular distally to the skullbase. 2. Positive CTA for emergent large vessel occlusion with right M2 occlusion and evolving right frontal lobe infarct involving the right insula and frontal operculum. Moderate surrounding penumbra as above. 3. Additional mild for age atherosclerotic changes above. No other high-grade or correctable stenosis.  Portable Chest Xray 11/25/2016 IMPRESSION: 1. Endotracheal tube tip about 4.3 cm superior to carina 2. Patchy atelectasis or infiltrate at the left lung base 3. Upper normal mediastinal with, likely augmented by low lung volume.  MRI Head 11/25/2016 IMPRESSION: 1. Right insula and frontal parietal junction acute infarction with volume of 39 cc. Several additional punctate foci of acute infarction are present scattered throughout the  right frontal lobe, right parietal lobe, right occipital lobe, and left superior cerebellar hemisphere. 2. Stable right sylvian and posterior frontal convexity extra-axial signal abnormality from extravasated contrast and/or subarachnoid emorrhage in comparison with prior CT given differences in technique.   2D Echo 11/26/2016 Study Conclusions - Left ventricle: The cavity size was normal. Systolic function was normal. The estimated ejection fraction was in the range of 60%to 65%. Wall motion was normal; there were no regional wall motion abnormalities. Left ventricular diastolic functionparameters were normal.  Carotid US 11/26/2016 Summary: - Findings consistent with 40 - 59 percent stenosis involving the proximal right internal carotid artery. Unable to clearly visualize the distal end of the carotid stent, visualized portions appear patent. - Findings consistent with a 1-39 percent stenosis involving theleft internal carotid artery with elevated systolic velocities. - Bilateral vertebral arteries appear patent with antegrade flow.Elevated velocities noted bilaterally, left greater than right.    HISTORY OF PRESENT ILLNESS Ezana Hubbert a 71 y.o.left-handed malewho has a past medical history of hepatitis C treated with Harvoni, who presented with Left-sided weakness, slurred speech, left facial droop.  He was in his usual state of health and last normal at 2 AM when he went to bed, woke up on  the morning of 11/25/2016 around 9 AM with left-sided weakness, left facial droop and slurred speech. He went to [facility], where he was evaluated by neurology and found to have a right MCA ischemic stroke. His NIH stroke scale at that hospital was recorded to be 6.  Noncontrast head CT showed early changes of ischemia in the right MCA territory and a hyperdense right MCA.  He was transferred over to [facility] for evaluation for possible thrombectomy.  He was not given IV TPA  because she was outside of the TPA window at the time of presentation.  The patient is now s/p thrombectomy and revascularization of superior division of R MCA with T1C1 reperfusion.    LKW: 0200 on 11/25/2016 ICH Score: Not applicable Modified Rankin Score:0  Patient was not administered IV t-PA secondary to arriving outside of the treatment widnow. He was admitted to the neuro ICU for further evaluation and treatment. The patient underwent emergent proximal right ICA angioplasty with stenting followed by mechanical thrombectomy of the right MCA clot using solitaire device with good recanalization. Patient did clinically well and was following commands. He was extubated and did well. He had mild left facial droop and significant left arm weakness but he was able to move his left leg against gravity. He was seen by physical occupational speech therapy and felt to be a good patient for inpatient rehabilitation. He was placed on aspirin and brillinta due to his fresh carotid stent and transferred to rehabilitation in stable condition.   HOSPITAL COURSE Mr. Donavan Kerlin is a 71 y.o. male with history of hepatitis C treated with Harvoni, who presented with Left-sided weakness, slurred speech, left facial droop.  He did not receive IV t-PA due to arriving outside of the treatment window.   Stroke: Early acute right middle cerebral artery distribution stroke, likely embolic in setting of ruptured plaque in R ICA with near occlusive stenosis with associated intraluminal dissection and thrombus status post right  ICA angioplasty and stenting with thrombectomy of the right superior division middle cerebral artery using solitaire device   Resultant  left hemiplegia  CT head: Early acute right middle cerebral artery distribution stroke  MRI head: Right insula and frontal parietal junction acute infarction and several acute right frontal, parietal, and occipital lobes and the left superior cerebellar  hemisphere.  Carotid Doppler: 40-59% R ICA stenosis, 1-39% L ICA stenosis.  Bilateral antegrade VA flow.  2D Echo: EF 60-65%. No source of embolus  LDL 97  HgbA1c 5.4  SCDs for VTE prophylaxis  Diet NPO time specified  No antithrombotic prior to admission, now on aspirin 325 mg daily and clopidogrel 75 mg daily  Patient counseled to be compliant with his antithrombotic medications  Ongoing aggressive stroke risk factor management  Therapy recommendations: pending   Disposition: CIR  Hypertension  Stable  Permissive hypertension (OK if < 220/120) but gradually normalize in 5-7 days  Long-term BP goal normotensive  Hyperlipidemia  Home meds: none  LDL 97, goal < 70  Start atorvastatin 67m PO daily  Continue statin after discharge  Other Stroke Risk Factors  Advanced age  Obesity, Body mass index is 30.65 kg/m., recommend weight loss, diet and exercise as appropriate  Other Active Problems  None  DISCHARGE EXAM Blood pressure (!) 142/73, pulse 70, temperature 99.7 F (37.6 C), temperature source Oral, resp. rate 18, height 5' 10"  (1.778 m), weight 96.9 kg (213 lb 10 oz), SpO2 98 %.  Pleasant middle aged African-American male  not in distress.  Afebrile. Head is nontraumatic. Neck is supple without bruit.  Cardiac exam no murmur or gallop. Lungs are clear to auscultation. Distal pulses are well felt.   Neurological Exam:  Awake alert and follows simple midline commands as well as moves all 4 extremities to command. Pupils 4 mm equal reactive. Fundi not visualized. Blinks to threat more on the right than the left. Mild left lower facial weakness. Tongue midline. Motor system exam antigravity good strength on the right.  Left hemiplegia with left upper extremity 1/5 and left lower extremity 3/5 strength.  Deep tendon flexes are depressed on the left compared to the right.  Right plantar downgoing left is upgoing.  Gait not tested.  Discharge Diet  DIET DYS 3  Room service appropriate? Yes; Fluid consistency: Thin liquids  DISCHARGE PLAN  Disposition:  Transfer to [place] for ongoing PT, OT and ST  aspirin 81 mg daily and ticegralor (Brilinta) 90 mg twice daily for secondary stroke prevention.  Recommend ongoing risk factor control by Primary Care Physician at time of discharge from inpatient rehabilitation.  Follow-up Jearld Fenton, NP in 2 weeks following discharge from rehab.  Follow-up with Antony Contras, Stroke Clinic in 6 weeks, office to schedule an appointment.   40 minutes were spent preparing discharge.  I have personally examined this patient, reviewed notes, independently viewed imaging studies, participated in medical decision making and plan of care.ROS completed by me personally and pertinent positives fully documented  I have made any additions or clarifications directly to the above note. Agree with note above.    Antony Contras, MD Medical Director [facility] Pager: [phone] 11/29/2016 4:09 PM

## 2016-11-29 NOTE — Care Management Note (Signed)
Case Management Note  Patient Details  Name: Jason CroakJames Kistler MRN: [id number] Date of Birth: 1945-11-03  Subjective/Objective:                    Action/Plan: Pt discharging to CIR today.  No further needs per CM.   Expected Discharge Date:  11/29/16               Expected Discharge Plan:  IP Rehab Facility  In-House Referral:     Discharge planning Services     Post Acute Care Choice:    Choice offered to:     DME Arranged:    DME Agency:     HH Arranged:    HH Agency:     Status of Service:  Completed, signed off  If discussed at [employer] Meetings, dates discussed:    Additional Comments:  Kermit BaloKelli F Torian Thoennes, RN 11/29/2016, 2:19 PM

## 2016-11-29 NOTE — Progress Notes (Signed)
  Speech Language Pathology Treatment: Dysphagia  Patient Details Name: Jason Nolan MRN: [id number] DOB: [birth year] Today's Date: 11/29/2016 Time: [id number] SLP Time Calculation (min) (ACUTE ONLY): 15 min  Assessment / Plan / Recommendation Clinical Impression  Pt demonstrates excellent tolerance of lunch meal under supervision. Pt reportedly has been told to reduce rate by RN, SLP now observed pt to be very slowly and carefully eating without cues. Primary problem continues to be anterior spillage/labial residue on the left, exacerbated by pt feeding him self with his right hand with food angled to that corner of his mouth. SLP gave cues to aim for the right side of his mouth with his utensils which he immediately followed through the meal. No signs of aspiration. With single words pt still needs cues to attempt verbalization as he is relying primarily on gestures and facial expression to communicate. He can approximate single words with still significant suspicion of apraxia. Encouraged him to try to verbalize more often. Recommend CIR at d/c.   HPI HPI: 71 year old male with PMH as below, which is significant for Hepatitic C s/p treatment with Harvoni. He was last known normal at 2am on 7/23 when he went to sleep. He woke up around 9am with left sided weakness, facial droop, and slurred speech. [facility] where he was found to have a R MCA CVA, but was outside window for tpa. [facility]. He underwent successful revascularization of R MCA and R ICA. Post-procedurally he remained on ventilator and was transferred to ICU. Concern for potential hemorrhage post IR proedure, but may have been extravasation of dye and not a bleed. Intubated 7/23 to 7/25 in am. 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 Pt is left handed.       SLP Plan  Continue with current plan of care       Recommendations  Diet recommendations: Dysphagia 3  (mechanical soft);Thin liquid Liquids provided via: Straw Medication Administration: Whole meds with liquid Supervision: Patient able to self feed Compensations: Lingual sweep for clearance of pocketing;Minimize environmental distractions;Slow rate Postural Changes and/or Swallow Maneuvers: Seated upright 90 degrees                General recommendations: Rehab consult Follow up Recommendations: Inpatient Rehab SLP Visit Diagnosis: Dysphagia, oral phase (R13.11) Plan: Continue with current plan of care       GO               [facility] Center LLCBonnie Severo Beber, MA CCC-SLP [phone]  [name], Shane Badeaux [name] 11/29/2016, 11:58 AM

## 2016-11-29 NOTE — H&P (Signed)
Physical Medicine and Rehabilitation Admission H&P     Chief Complaint  Patient presents with  . Right sided weakness with aphasia.     HPI: Jason Nolan a 71 y.o.left handed male with history of Hep C-treated with Harvoni, glaucoma who was admitted via [facility] on 7/23 with left sided weakness, facial droop and slurred speech due to early changes of R-MCA territory infarct. He underwent CTA brain showed short segment near occlusion of proximal R-ICA with associated intraluminal thrombus --likely due to dissection and emergent large vessel occlusion with right M2 occlusion and evolving right frontal lobe infarct. He underwent cerebral angio with complete revascularization of occluded superior division of R-MCA and near complete revascularization of proximal R-ICA with stent assisted angioplasty. MRI brain done revealing right insula and frontal parietal junction acute infarct, several punctate foci of acute infarct in right frontal lobe, right parietal lobe, right occipital lobe, and left superior cerebellar hemisphere and stable right sylvian/posterior frontal convexity extra axial signal from extravasated contrast and/or SAH.   He tolerated extubation on 7/25. 2 D echo with EF 60-65% with no wall abnormality. Carotid dopplers showed 40-50% right proximal ICA stenosis, L- 1-39% stenosis and elevated velocities in B-VA. Stroke felt to be embolic in setting of ruptured plaque R-ICA and patient on ASA and brillinta due to stent. MBS done and patient started on dysphagia 3, thin liquids.  Patient with resultant left sided weakness, expressive deficits with aphonia and dysphagia. Therapy evaluations done 7/26 and CIR recommended due to deficits in mobility and ability to carry out ADL tasks   Review of Systems  HENT: Negative for hearing loss and tinnitus.   Eyes: Negative for blurred vision and double vision.  Respiratory: Negative for cough and sputum production.   Cardiovascular:  Negative for chest pain and palpitations.  Gastrointestinal: Positive for constipation. Negative for heartburn and nausea.  Genitourinary: Negative for dysuria and urgency.  Musculoskeletal: Positive for back pain. Negative for myalgias.  Skin: Negative for rash.  Neurological: Positive for sensory change, speech change, focal weakness and headaches. Negative for dizziness.  Psychiatric/Behavioral: The patient has insomnia.           Past Medical History:  Diagnosis Date  . Glaucoma   . Hepatitis C          Past Surgical History:  Procedure Laterality Date  . IR ANGIO INTRA EXTRACRAN SEL COM CAROTID INNOMINATE UNI L MOD SED  11/25/2016  . IR ANGIO VERTEBRAL SEL SUBCLAVIAN INNOMINATE UNI R MOD SED  11/25/2016  . IR ANGIO VERTEBRAL SEL VERTEBRAL UNI L MOD SED  11/25/2016  . IR INTRAVSC STENT CERV CAROTID W/O EMB-PROT MOD SED INC ANGIO  11/25/2016  . IR PERCUTANEOUS ART THROMBECTOMY/INFUSION INTRACRANIAL INC DIAG ANGIO  11/25/2016  . RADIOLOGY WITH ANESTHESIA N/A 11/25/2016   Procedure: RADIOLOGY WITH ANESTHESIA;  Surgeon: Radiologist, Medication, MD;  Location: [place];  Service: Radiology;  Laterality: N/A;         Family History  Problem Relation Age of Onset  . Hypertension Mother   . Hypertension Father     Social History:  Married. Retired. Indicated that wife in good health. Per reports he quit smoking. He quit smokeless tobacco use about 23 years ago. He reports that he does not drink alcohol or use drugs.    Allergies: No Known Allergies          Medications Prior to Admission  Medication Sig Dispense Refill  . PAZEO 0.7 % SOLN Place 1 drop  into both eyes daily.     Marland Kitchen VYZULTA 0.024 % SOLN Apply 1 drop to eye 2 (two) times daily.       Home: Home Living Family/patient expects to be discharged to:: Private residence Living Arrangements: Spouse/significant other Available Help at Discharge: Family, Available 24 hours/day Type of Home:  House Home Access: Level entry [place]er: One level Bathroom Shower/Tub: Gaffer, Charity fundraiser: Programmer, systems: Yes Home Equipment: Civil engineer, contracting - built in  Lives With: Spouse   Functional History: Prior Function Level of Independence: Independent Comments: retired Administrator, arts; [employer] for 8 years  Functional Status:  Mobility: [place] bed mobility: Needs Assistance Bed Mobility: Supine to Sit Supine to sit: Min assist General bed mobility comments: increased time, min A with L LE movement, transferring towards his R side, use of R UE on bed rail Transfers Overall transfer level: Needs assistance Equipment used: 2 person hand held assist Transfers: Sit to/from Stand, [employer] Transfers Sit to Stand: Mod assist, +2 physical assistance Stand pivot transfers: Mod assist, +2 physical assistance General transfer comment: +2 assist to power-up to full standing position, LUE supported throughout transfer. Once standing, pt able to improve posture with cues but was unable to maintain. Pt took a few pivotal steps around to the recliner with support at gait belt and physical assist with L LE movement  ADL: ADL Overall ADL's : Needs assistance/impaired Eating/Feeding: Moderate assistance, Sitting Eating/Feeding Details (indicate cue type and reason): Pt able to bring cup to mouth to expell secretions Grooming: Maximal assistance, Sitting Upper Body Bathing: Maximal assistance, Sitting Lower Body Bathing: Total assistance, Sit to/from stand Upper Body Dressing : Maximal assistance, Sitting Upper Body Dressing Details (indicate cue type and reason): to don gown Lower Body Dressing: Total assistance Lower Body Dressing Details (indicate cue type and reason): to don socks Toilet Transfer: Moderate assistance, +2 for physical assistance, Stand-pivot Toilet Transfer Details (indicate cue type and reason): Simulated by transfer [name] >  chair Functional mobility during ADLs: Moderate assistance, +2 for physical assistance General ADL Comments: Discussed post acute rehab with pt; he is agreeable. Educated pt on protecting RUE from injury, propping on pillow for edema control and protection.  Cognition: Cognition Overall Cognitive Status: Within Functional Limits for tasks assessed Arousal/Alertness: Awake/alert Orientation Level: Oriented X4, Other (comment) (gestures appropriately; difficult to assess d/t speech) Attention: Sustained Sustained Attention: Appears intact Cognition Arousal/Alertness: Awake/alert Behavior During Therapy: WFL for tasks assessed/performed Overall Cognitive Status: Within Functional Limits for tasks assessed General Comments: Significant expressive aphasia - pt unable to answer questions verbally however was answering therapist with head and hand gestures.  Difficult to assess due to: Impaired communication   Blood pressure (!) 142/73, pulse 70, temperature 99.7 F (37.6 C), temperature source Oral, resp. rate 18, height _0  (1.778 m), weight 96.9 kg (213 lb 10 oz), SpO2 98 %. Physical Exam  Nursing note and vitals reviewed. Constitutional: He is oriented to person, place, and time. He appears well-developed and well-nourished. No distress.  Fatigued appearing. Cortak in place   HENT:  Head: Normocephalic and atraumatic.  Mouth/Throat: Oropharynx is clear and moist.  Eyes: Pupils are equal, round, and reactive to light. Conjunctivae and EOM are normal.  Crusting around left eye  Neck: Normal range of motion. Neck supple.  Cardiovascular: Normal rate and regular rhythm.   No murmur heard. Respiratory: Effort normal and breath sounds normal. No stridor.  GI: Soft. Bowel sounds are normal.  Musculoskeletal:  He exhibits edema (LUE).  Neurological: He is alert and oriented to person, place, and time.  Able to answer Y/N orientation questions without difficulty. Seech severely  dysarthric. Able to make out words with extra time. Fair insight and awareness.  Left facial weakness with tongue deviation to left. LUE 1-2/5 Deltoid and biceps, 0/5 wrist and hand. RUE 4/5. RLE 4/5. LLE: 1+ to 2/5 HF, KE and 0/5 ADF/PF. DTR's depressed. Senses gross touch left arm and leg.   Skin: Skin is warm and dry. He is not diaphoretic.  Psychiatric:  Generally pleasant and cooperative    Lab Results Last 48 Hours        Results for orders placed or performed during the hospital encounter of 11/25/16 (from the past 48 hour(s))  CBC     Status: Abnormal   Collection Time: 11/28/16  2:16 AM  Result Value Ref Range   WBC 4.2 4.0 - 10.5 K/uL   RBC 4.09 (L) 4.22 - 5.81 MIL/uL   Hemoglobin 12.6 (L) 13.0 - 17.0 g/dL   HCT 38.3 (L) 39.0 - 52.0 %   MCV 93.6 78.0 - 100.0 fL   MCH 30.8 26.0 - 34.0 pg   MCHC 32.9 30.0 - 36.0 g/dL   RDW 14.3 11.5 - 15.5 %   Platelets 154 150 - 400 K/uL  Basic metabolic panel     Status: Abnormal   Collection Time: 11/28/16  2:16 AM  Result Value Ref Range   Sodium 138 135 - 145 mmol/L   Potassium 3.6 3.5 - 5.1 mmol/L   Chloride 107 101 - 111 mmol/L   CO2 25 22 - 32 mmol/L   Glucose, Bld 106 (H) 65 - 99 mg/dL   BUN 8 6 - 20 mg/dL   Creatinine, Ser 1.05 0.61 - 1.24 mg/dL   Calcium 8.0 (L) 8.9 - 10.3 mg/dL   GFR calc non Af Amer >60 >60 mL/min   GFR calc Af Amer >60 >60 mL/min    Comment: (NOTE) The eGFR has been calculated using the CKD EPI equation. This calculation has not been validated in all clinical situations. eGFR's persistently <60 mL/min signify possible Chronic Kidney Disease.    Anion gap 6 5 - 15  Magnesium     Status: None   Collection Time: 11/28/16  2:16 AM  Result Value Ref Range   Magnesium 2.2 1.7 - 2.4 mg/dL  Phosphorus     Status: None   Collection Time: 11/28/16  2:16 AM  Result Value Ref Range   Phosphorus 2.9 2.5 - 4.6 mg/dL  Platelet inhibition p2y12 (Not at [facility])     Status: Abnormal    Collection Time: 11/28/16 10:22 AM  Result Value Ref Range   Platelet Function  P2Y12 52 (L) 194 - 418 PRU    Comment:        The literature has shown a direct correlation of PRU values over 230 with higher risks of thrombotic events.  Lower PRU values are associated with platelet inhibition.       Imaging Results (Last 48 hours)  Dg Abd Portable 1v  Result Date: 11/27/2016 CLINICAL DATA:  Feeding tube placement EXAM: PORTABLE ABDOMEN - 1 VIEW COMPARISON:  None. FINDINGS: Feeding tube tip is in the distal stomach. Nonobstructive bowel gas pattern with mild gaseous distention of bowel diffusely. IMPRESSION: Feeding tube tip in the distal stomach. Electronically Signed   By: Rolm Baptise M.D.   On: 11/27/2016 14:39   Dg Swallowing Func-speech Pathology  Result  Date: 11/28/2016 Objective Swallowing Evaluation: Type of Study: MBS-Modified Barium Swallow Study Patient Details Name: Forrest Jaroszewski MRN: [id number] Date of Birth: [birth year] Today's Date: 11/28/2016 Time: SLP Start Time (ACUTE ONLY): 1120-SLP Stop Time (ACUTE ONLY): 1240 SLP Time Calculation (min) (ACUTE ONLY): 80 min Past Medical History: Past Medical History: Diagnosis Date . Glaucoma  . Hepatitis C  Past Surgical History: Past Surgical History: Procedure Laterality Date . IR ANGIO INTRA EXTRACRAN SEL COM CAROTID INNOMINATE UNI L MOD SED  11/25/2016 . IR ANGIO VERTEBRAL SEL SUBCLAVIAN INNOMINATE UNI R MOD SED  11/25/2016 . IR ANGIO VERTEBRAL SEL VERTEBRAL UNI L MOD SED  11/25/2016 . IR INTRAVSC STENT CERV CAROTID W/O EMB-PROT MOD SED INC ANGIO  11/25/2016 . IR PERCUTANEOUS ART THROMBECTOMY/INFUSION INTRACRANIAL INC DIAG ANGIO  11/25/2016 . RADIOLOGY WITH ANESTHESIA N/A 11/25/2016  Procedure: RADIOLOGY WITH ANESTHESIA;  Surgeon: Radiologist, Medication, MD;  Location: [place];  Service: Radiology;  Laterality: N/A; HPI: 71 year old male with PMH as below, which is significant for Hepatitic C s/p treatment with Harvoni. He was last  known normal at 2am on 7/23 when he went to sleep. He woke up around 9am with left sided weakness, facial droop, and slurred speech. He presented to [facility] where he was found to have a R MCA CVA, but was outside window for tpa. He was transferred to [facility] for IR evaluation. He underwent successful revascularization of R MCA and R ICA. Post-procedurally he remained on ventilator and was transferred to ICU. Concern for potential hemorrhage post IR proedure, but may have been extravasation of dye and not a bleed. Intubated 7/23 to 7/25 in am. 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 Pt is left handed.  No Data Recorded Assessment / Plan / Recommendation CHL IP CLINICAL IMPRESSIONS 11/28/2016 Clinical Impression Despite significant left lingual weakness and left labial/facial sensory loss, pt is able to tolerate thin liquids and masticate regular solids without oral residuals. There is mild anterior spillage, but no retained barium in the buccal cavity. Oropharyngeal phase WNL. Primary concern is pt biting his tongue during mastication, possibly without awareness. Recommend pt attempt a dys 3 mechanical soft diet with thin liquids, but will downgrade texture as needed if he cannot manage his tongue. May also need to attempt compensatory strategies for anterior loss. Will follow for tolerance.  SLP Visit Diagnosis Dysphagia, oral phase (R13.11) Attention and concentration deficit following -- Frontal lobe and executive function deficit following -- Impact on safety and function Mild aspiration risk   CHL IP TREATMENT RECOMMENDATION 11/28/2016 Treatment Recommendations Therapy as outlined in treatment plan below   Prognosis 11/28/2016 Prognosis for Safe Diet Advancement Good Barriers to Reach Goals -- Barriers/Prognosis Comment -- CHL IP DIET RECOMMENDATION 11/28/2016 SLP Diet Recommendations Dysphagia 3 (Mech soft) solids;Thin liquid Liquid Administration via Cup;Straw Medication Administration  Whole meds with liquid Compensations Other (Comment) Postural Changes Remain semi-upright after after feeds/meals (Comment);Seated upright at 90 degrees   CHL IP OTHER RECOMMENDATIONS 11/28/2016 Recommended Consults -- Oral Care Recommendations Oral care BID Other Recommendations --   CHL IP FOLLOW UP RECOMMENDATIONS 11/28/2016 Follow up Recommendations Inpatient Rehab   CHL IP FREQUENCY AND DURATION 11/28/2016 Speech Therapy Frequency (ACUTE ONLY) min 2x/week Treatment Duration 2 weeks      CHL IP ORAL PHASE 11/28/2016 Oral Phase Impaired Oral - Pudding Teaspoon -- Oral - Pudding Cup -- Oral - Honey Teaspoon -- Oral - Honey Cup -- Oral - Nectar Teaspoon -- Oral - Nectar Cup -- Oral -  Nectar Straw -- Oral - Thin Teaspoon -- Oral - Thin Cup Left anterior bolus loss Oral - Thin Straw Left anterior bolus loss Oral - Puree WFL Oral - Mech Soft -- Oral - Regular WFL Oral - Multi-Consistency -- Oral - Pill WFL Oral Phase - Comment --  CHL IP PHARYNGEAL PHASE 11/28/2016 Pharyngeal Phase WFL Pharyngeal- Pudding Teaspoon -- Pharyngeal -- Pharyngeal- Pudding Cup -- Pharyngeal -- Pharyngeal- Honey Teaspoon -- Pharyngeal -- Pharyngeal- Honey Cup -- Pharyngeal -- Pharyngeal- Nectar Teaspoon -- Pharyngeal -- Pharyngeal- Nectar Cup -- Pharyngeal -- Pharyngeal- Nectar Straw -- Pharyngeal -- Pharyngeal- Thin Teaspoon -- Pharyngeal -- Pharyngeal- Thin Cup -- Pharyngeal -- Pharyngeal- Thin Straw -- Pharyngeal -- Pharyngeal- Puree -- Pharyngeal -- Pharyngeal- Mechanical Soft -- Pharyngeal -- Pharyngeal- Regular -- Pharyngeal -- Pharyngeal- Multi-consistency -- Pharyngeal -- Pharyngeal- Pill -- Pharyngeal -- Pharyngeal Comment --  No flowsheet data found. No flowsheet data found. Herbie Baltimore, [place] [phone] Lynann Beaver 11/28/2016, 12:20 PM                   Medical Problem List and Plan: 1.  Left hemiparesis and speech/swallowing difficulties secondary to embolic right MCA infarct             -admit to  inpatient rehab 2.  DVT Prophylaxis/Anticoagulation: Pharmaceutical: Lovenox 3. Pain Management: tylenol prn  4. Mood: LCSW to follow for evaluation and support. Team to provide ego support--showing signs of adjustment reaction. 5. Neuropsych: This patient appears to be capable of making decisions on his own behalf. 6. Skin/Wound Care: routine pressure relief measures.  7. Fluids/Electrolytes/Nutrition: Monitor I/O. Check lytes in am.  8. Glaucoma: Continue Xalatan as at home.  9. ABLA: Recheck in am. 10. Elevated triglycerides: Now on Lipitor.  11. HTN: permissive HTN for 5-7 days for adequate perfusion with long term goal of normotensive BPs.  12. Dysphagia: pt on D3 diet per acute SLP recs: pt very dysarthric with poor oro-motor control. I have reviewed report from acute. We will stay with current diet watching closely for any signs or symptoms of aspiration. Aspiration precautions    Post Admission Physician Evaluation: 1. Functional deficits secondary  to right MCA infarct. 2. Patient is admitted to receive collaborative, interdisciplinary care between the physiatrist, rehab nursing staff, and therapy team. 3. Patient's level of medical complexity and substantial therapy needs in context of that medical necessity cannot be provided at a lesser intensity of care such as a SNF. 4. Patient has experienced substantial functional loss from his/her baseline which was documented above under the "Functional History" and "Functional Status" headings.  Judging by the patient's diagnosis, physical exam, and functional history, the patient has potential for functional progress which will result in measurable gains while on inpatient rehab.  These gains will be of substantial and practical use upon discharge  in facilitating mobility and self-care at the household level. 5. Physiatrist will provide 24 hour management of medical needs as well as oversight of the therapy plan/treatment and provide  guidance as appropriate regarding the interaction of the two. 6. The Preadmission Screening has been reviewed and patient status is unchanged unless otherwise stated above. 7. 24 hour rehab nursing will assist with bladder management, bowel management, safety, skin/wound care, disease management, medication administration, pain management and patient education  and help integrate therapy concepts, techniques,education, etc. 8. PT will assess and treat for/with: Lower extremity strength, range of motion, stamina, balance, functional mobility, safety, adaptive techniques and equipment, NMR, family ed, ego  support.   Goals are: supervision. 9. OT will assess and treat for/with: ADL's, functional mobility, safety, upper extremity strength, adaptive techniques and equipment, NMR, family ed.   Goals are: supervision to min assist. Therapy may proceed with showering this patient. 10. SLP will assess and treat for/with: cognition, speech, communication, language, family ed.  Goals are: supervision to min assist. 11. Case Management and Social Worker will assess and treat for psychological issues and discharge planning. 12. Team conference will be held weekly to assess progress toward goals and to determine barriers to discharge. 13. Patient will receive at least 3 hours of therapy per day at least 5 days per week. 14. ELOS: 21-25 days       15. Prognosis:  good     Meredith Staggers, MD, [facility] 11/29/2016  Bary Leriche, Hershal Coria 11/29/2016

## 2016-11-29 NOTE — Progress Notes (Signed)
Pt. Arrived on unit from [facility] in wheelchair. Pt is A&O x4 with dysarthia, communicates with pen/paper. No family present. Pt. Oriented to unit, Health Resource notebook, fall preventioin plan, rehab safety plan, and rehab processes.

## 2016-11-29 NOTE — Progress Notes (Signed)
I met with pt and his wife at bedside to discuss an inpt rehab admit. They are in agreement to admit and I have a bed available today. Dr. Leonie Man in to see pt and he will d/c. I will make the arrangements to admit today. [phone]

## 2016-11-29 NOTE — Consult Note (Signed)
[facility] CM Primary Care Navigator  11/29/2016  Kalman Nylen [birth year] [id number]   Met with patientand wife Orbie Hurst) at the bedside to identify possible discharge needs.  Wife reports that patient had "garbled speech" and left facial droopthathad led to this admission.  Patient's wife confirmed that primary care provider is Webb Silversmith, NP with [employer] at [facility]. Patient uses [facility] and [facility] in [place] obtain medications without any problem.  Per wife, patient had been managing his medications at home. Wife states that he drives prior to admission, however, friend Elberta Fortis) or neighbor Marlowe Kays) can providetransportation to his doctor's appointments after discharge since wife does not drive.  Patient had been independent with self care prior to admission. His wife will be his primary caregiverat home as stated.  Anticipated discharge plan is [facility]) according to wife.  Patient and wife expressed understanding to call primary care provider's office when he returns back home, for a post discharge follow-up appointment within a week or sooner if needed. Patient letter (with PCP's contact number) was provided as a reminder.  Explained to patient about [facility] CM services available for health management at home.   Both verbally agreed to [facility] strokecalls to follow-up patient as he recovers.  Noted that order was already infor EMMI stroke calls after discharge.  [facility] care management information provided for future needs that may arise.   For questions, please contact:  Dannielle Huh, BSN, RN- [facility] Primary Care Navigator  Telephone: [phone] [place]

## 2016-11-29 NOTE — Progress Notes (Signed)
Referring Physician(s):  Dr. 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  Still with trouble speaking, but has been able to eat and drink.   Allergies: Patient has no known allergies.  Medications: Prior to Admission medications   Medication Sig Start Date End Date Taking? Authorizing Provider  PAZEO 0.7 % SOLN Place 1 drop into both eyes daily.  10/11/16  Yes [provider]  VYZULTA 0.024 % SOLN Apply 1 drop to eye 2 (two) times daily.  11/12/16  Yes [provider]     Vital Signs: BP (!) 142/73 (BP Location: Right Arm)   Pulse 70   Temp 99.7 F (37.6 C) (Oral)   Resp 18   Ht 5\' 10"  (1.778 m)   Wt 213 lb 10 oz (96.9 kg)   SpO2 98%   BMI 30.65 kg/m   Physical Exam  NAD, alert Neuro: attemps to vocalize but with severe dysarthria, left facial droop.  Unable to move LUE, but with LLE movement.  Imaging: Ct Angio Head W Or Wo Contrast  Result Date: 11/25/2016 CLINICAL DATA:  Initial evaluation for acute dysarthria, stroke. EXAM: CT ANGIOGRAPHY HEAD AND NECK CT PERFUSION BRAIN TECHNIQUE: Multidetector CT imaging of the head and neck was performed using the standard protocol during bolus administration of intravenous contrast. Multiplanar CT image reconstructions and MIPs were obtained to evaluate the vascular anatomy. Carotid stenosis measurements (when applicable) are obtained utilizing NASCET criteria, using the distal internal carotid diameter as the denominator. Multiphase CT imaging of the brain was performed following IV bolus contrast injection. Subsequent parametric perfusion maps were calculated using RAPID software. CONTRAST:  100 cc of Isovue 370. COMPARISON:  Prior head CT from earlier the same day. FINDINGS: CT HEAD FINDINGS Brain: Evolving acute ischemic right MCA territory infarct involving the right insular  cortex and right frontal operculum (series 6, images 15, 19). No evidence for hemorrhage or mass effect. Overall, this is stable to minimally progressed from scan performed earlier the same day and. No other acute large vessel territory infarct. No acute intracranial hemorrhage. No mass lesion, midline shift or mass effect. No hydrocephalus. No extra-axial fluid collection. Vascular: Focal hyperdensity within distal right M1/proximal right M2 branches, consistent with thrombus. Vascular calcifications noted within the carotid siphons. Skull: Scalp soft tissues and calvarium within normal limits. Sinuses/Orbits: Globes and orbital soft tissues within normal limits. Mild mucosal thickening within the maxillary sinuses. Paranasal sinuses are otherwise clear. No mastoid effusion. ASPECTS [facility] Stroke Program Early CT Score) - Ganglionic level infarction (caudate, lentiform nuclei, internal capsule, insula, M1-M3 cortex): 5 - Supraganglionic infarction (M4-M6 cortex): 2 Total score (0-10 with 10 being normal): 7 Review of the MIP images confirms the above findings CTA NECK FINDINGS Aortic arch: Visualized aortic arch of normal caliber with normal branch pattern. Minimal plaque within the arch itself. No flow-limiting stenosis about the origin of the great vessels. Visualized subclavian arteries widely patent. Right carotid system: Right common carotid artery widely patent from its origin to the bifurcation. A centric calcified plaque about the right bifurcation/proximal right ICA. There is abrupt near occlusion of the proximal right ICA at the bifurcation (series 12, image 202). A focal string sign is present. Intraluminal filling defect just distally consistent with thrombus. Area of involvement begins at the bifurcation and measures approximately 15 mm in length. A focal ruptured plaque with associated acute  dissection is suspected. Distally, the right ICA is somewhat diminutive in caliber and irregular, but is  patent to the skullbase without additional focal stenosis or other vascular abnormality. Left carotid system: Left common carotid artery widely patent from its origin to the bifurcation. Minimal plaque about the left bifurcation without stenosis. Left ICA widely patent distally to the skullbase without stenosis, dissection, or occlusion. Vertebral arteries: Left vertebral artery arises separately from the aortic arch. Right vertebral artery rises from the right subclavian artery. Right vertebral artery slightly dominant. Vertebral arteries patent within the neck without stenosis, dissection, or occlusion. Skeleton: No acute osseus abnormality. No worrisome lytic or blastic osseous lesions. Mild to moderate multilevel degenerative spondylolysis within the cervical spine, greatest at C5-6. Other neck: No acute soft tissue abnormality within the neck. Salivary glands normal. No adenopathy. Thyroid normal. Upper chest: Visualized upper chest within normal limits. Visualized lungs are clear. Review of the MIP images confirms the above findings CTA HEAD FINDINGS Anterior circulation: Petrous, cavernous, and supraclinoid segments patent bilaterally without flow-limiting stenosis. Minimal plaque noted within the cavernous ICAs. ICA termini widely patent. A1 segments patent bilaterally. Anterior communicating artery normal. Anterior cerebral artery is patent to their distal aspects. Right M1 segment patent to its distal aspect. There is occlusion of proximal right M2 branch at the base of the right sylvian fissure (series 12, image 96), likely embolic from thrombus seen in the neck. Right MCA branches demonstrate multifocal irregularity with stenoses distally. Left M1 segment patent to its distal aspect. No proximal left M2 occlusion. Left MCA branches well opacified to their distal aspects. Posterior circulation: Vertebral artery's patent to the vertebrobasilar junction without stenosis. Patent left PICA. Dominant right  AICA. Basilar artery widely patent to its distal aspect. Superior cerebral arteries patent bilaterally. PCA supplied via the basilar as well is prominent bilateral posterior communicating arteries. PCAs patent to their distal aspects. Venous sinuses: Patent. Anatomic variants: No significant anatomic variant. No aneurysm or vascular malformation. Delayed phase: Not performed. Review of the MIP images confirms the above findings CT Brain Perfusion Findings: CBF (<30%) Volume: 0mL Perfusion (Tmax>6.0s) volume: 86mL Mismatch Volume: 86mL Infarction Location:While no core infarct is registered, there is evidence of evolving ischemia within the right insula and frontal operculum on prior noncontrast head CT. This is suspected to be mass by collateralization. Evidence of moderate surrounding penumbra within the right frontal lobe on associated perfusion map. IMPRESSION: 1. 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 Right ICA somewhat diffusely narrow and irregular distally to the skullbase. 2. Positive CTA for emergent large vessel occlusion with right M2 occlusion and evolving right frontal lobe infarct involving the right insula and frontal operculum. Moderate surrounding penumbra as above. 3. Additional mild for age atherosclerotic changes above. No other high-grade or correctable stenosis. Critical Value/emergent results were called by telephone at the time of interpretation on 11/25/2016 at 4:16 pm to Dr. Milon Dikes , who verbally acknowledged these results. Electronically Signed   By: Rise Mu M.D.   On: 11/25/2016 16:53   Ct Head Wo Contrast  Result Date: 11/25/2016 CLINICAL DATA:  Follow-up exam status post catheter directed thrombectomy. EXAM: CT HEAD WITHOUT CONTRAST TECHNIQUE: Contiguous axial images were obtained from the base of the skull through the  vertex without intravenous contrast. COMPARISON:  Prior CTs from earlier the same day. FINDINGS: Brain: Serpiginous hyperdensity seen layering throughout the right sylvian fissure  and multiple cortical sulci of the overlying right frontal lobe, likely reflecting contrast leakage from recent arteriogram. Superimposed hemorrhage not excluded. Evolving hypodensity within the right insula an overlying operculum and right frontal lobe consistent with right MCA territory stroke. Overall, this has slightly evolved from previous. No other acute intracranial hemorrhage. No other acute large vessel territory infarct. No mass lesion or midline shift. No hydrocephalus. No extra-axial fluid collection. Vascular: Diffuse hyperdensity throughout the intracranial vasculature, like related IV contrast. Skull: Scalp soft tissues and calvarium within normal limits. Sinuses/Orbits: Globes and orbital soft tissues within normal limits. Scattered mucosal thickening within the ethmoidal air cells, sphenoid sinuses, and maxillary sinuses. Mastoid air cells remain clear. IMPRESSION: 1. Serpiginous subarachnoid hyperdensity within the right sylvian fissure and overlying right frontal lobe, most consistent with contrast leakage from recent catheter directed arteriogram. Superimposed hemorrhage not entirely excluded. Attention at follow-up recommended. 2. Evolving acute right MCA territory infarct. Electronically Signed   By: Rise Mu M.D.   On: 11/25/2016 20:27   Ct Head Wo Contrast  Result Date: 11/25/2016 CLINICAL DATA:  71 year old who woke up this morning with facial droop, left upper extremity weakness and slurred speech. EXAM: CT HEAD WITHOUT CONTRAST TECHNIQUE: Contiguous axial images were obtained from the base of the skull through the vertex without intravenous contrast. COMPARISON:  None. FINDINGS: Brain: Ventricular system normal in size and appearance for age. Mild changes of small vessel disease of the white  matter. Asymmetric low attenuation involving the white matter of the right superior temporal lobe extending into the right frontal lobe. No associated hemorrhage or hematoma. No extra-axial fluid collections. No visible mass lesion. No midline shift. Mild cerebellar vermian atrophy. No cortical atrophy. Vascular: Hyperdense right middle cerebral artery. Mild bilateral carotid siphon atherosclerosis. Skull: No skull fracture or other focal osseous abnormality involving the skull. Sinuses/Orbits: Visualized paranasal sinuses, bilateral mastoid air cells and bilateral middle ear cavities well-aerated. Visualized orbits and globes are normal. Other: None. IMPRESSION: 1. 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 Hyperdense right middle cerebral artery indicates acute or subacute occlusion. 2. No associated hemorrhage, hematoma, mass effect or midline shift at this time. I personally telephoned these results at the time of interpretation on 11/25/2016 at 1:51 pm to Dr. Rockne Menghini, who verbally acknowledged these results. Electronically Signed   By: Hulan Saas M.D.   On: 11/25/2016 13:53   Ct Angio Neck W Or Wo Contrast  Result Date: 11/25/2016 CLINICAL DATA:  Initial evaluation for acute dysarthria, stroke. EXAM: CT ANGIOGRAPHY HEAD AND NECK CT PERFUSION BRAIN TECHNIQUE: Multidetector CT imaging of the head and neck was performed using the standard protocol during bolus administration of intravenous contrast. Multiplanar CT image reconstructions and MIPs were obtained to evaluate the vascular anatomy. Carotid stenosis measurements (when applicable) are obtained utilizing NASCET criteria, using the distal internal carotid diameter as the denominator. Multiphase CT imaging of the brain was performed following IV bolus contrast injection. Subsequent parametric perfusion maps were calculated using RAPID software. CONTRAST:  100 cc of Isovue 370. COMPARISON:  Prior head CT from earlier  the same day. 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 No evidence for hemorrhage or mass effect. Overall, this is stable to minimally progressed from scan performed earlier the same day and. No other acute large vessel territory infarct. No acute intracranial hemorrhage. No mass lesion, midline shift or mass effect. No  hydrocephalus. No extra-axial fluid collection. Vascular: Focal hyperdensity within distal right M1/proximal right M2 branches, consistent with thrombus. Vascular calcifications noted within the carotid siphons. Skull: Scalp soft tissues and calvarium within normal limits. Sinuses/Orbits: Globes and orbital soft tissues within normal limits. Mild mucosal thickening within the maxillary sinuses. Paranasal sinuses are otherwise clear. No mastoid effusion. ASPECTS [facility]([place] Stroke Program Early CT Score) - Ganglionic level infarction (caudate, lentiform nuclei, internal capsule, insula, M1-M3 cortex): 5 - Supraganglionic infarction (M4-M6 cortex): 2 Total score (0-10 with 10 being normal): 7 Review of the MIP images confirms the above findings CTA NECK FINDINGS Aortic arch: Visualized aortic arch of normal caliber with normal branch pattern. Minimal plaque within the arch itself. No flow-limiting stenosis about the origin of the great vessels. Visualized subclavian arteries widely patent. Right carotid system: Right common carotid artery widely patent from its origin to the bifurcation. A centric calcified plaque about the right bifurcation/proximal right ICA. There is abrupt near occlusion of the proximal right ICA at the bifurcation (series 12, image 202). A focal string sign is present. Intraluminal filling defect just distally consistent with thrombus. Area of involvement begins at the bifurcation and measures approximately 15 mm in length. A focal ruptured plaque  with associated acute dissection is suspected. Distally, the right ICA is somewhat diminutive in caliber and irregular, but is patent to the skullbase without additional focal stenosis or other vascular abnormality. Left carotid system: Left common carotid artery widely patent from its origin to the bifurcation. Minimal plaque about the left bifurcation without stenosis. Left ICA widely patent distally to the skullbase without stenosis, dissection, or occlusion. Vertebral arteries: Left vertebral artery arises separately from the aortic arch. Right vertebral artery rises from the right subclavian artery. Right vertebral artery slightly dominant. Vertebral arteries patent within the neck without stenosis, dissection, or occlusion. Skeleton: No acute osseus abnormality. No worrisome lytic or blastic osseous lesions. Mild to moderate multilevel degenerative spondylolysis within the cervical spine, greatest at C5-6. Other neck: No acute soft tissue abnormality within the neck. Salivary glands normal. No adenopathy. Thyroid normal. Upper chest: Visualized upper chest within normal limits. Visualized lungs are clear. Review of the MIP images confirms the above findings CTA HEAD FINDINGS Anterior circulation: Petrous, cavernous, and supraclinoid segments patent bilaterally without flow-limiting stenosis. Minimal plaque noted within the cavernous ICAs. ICA termini widely patent. A1 segments patent bilaterally. Anterior communicating artery normal. Anterior cerebral artery is patent to their distal aspects. Right M1 segment patent to its distal aspect. There is occlusion of proximal right M2 branch at the base of the right sylvian fissure (series 12, image 96), likely embolic from thrombus seen in the neck. Right MCA branches demonstrate multifocal irregularity with stenoses distally. Left M1 segment patent to its distal aspect. No proximal left M2 occlusion. Left MCA branches well opacified to their distal aspects.  Posterior circulation: Vertebral artery's patent to the vertebrobasilar junction without stenosis. Patent left PICA. Dominant right AICA. Basilar artery widely patent to its distal aspect. Superior cerebral arteries patent bilaterally. PCA supplied via the basilar as well is prominent bilateral posterior communicating arteries. PCAs patent to their distal aspects. Venous sinuses: Patent. Anatomic variants: No significant anatomic variant. No aneurysm or vascular malformation. Delayed phase: Not performed. Review of the MIP images confirms the above findings CT Brain Perfusion Findings: CBF (<30%) Volume: 0mL Perfusion (Tmax>6.0s) volume: 86mL Mismatch Volume: 86mL Infarction Location:While no core infarct is registered, there is evidence of evolving ischemia within the right insula and frontal operculum  on prior noncontrast head CT. This is suspected to be mass by collateralization. Evidence of moderate surrounding penumbra within the right frontal lobe on associated perfusion map. IMPRESSION: 1. 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 Right ICA somewhat diffusely narrow and irregular distally to the skullbase. 2. Positive CTA for emergent large vessel occlusion with right M2 occlusion and evolving right frontal lobe infarct involving the right insula and frontal operculum. Moderate surrounding penumbra as above. 3. Additional mild for age atherosclerotic changes above. No other high-grade or correctable stenosis. Critical Value/emergent results were called by telephone at the time of interpretation on 11/25/2016 at 4:16 pm to Dr. Milon Dikes , who verbally acknowledged these results. Electronically Signed   By: Rise Mu M.D.   On: 11/25/2016 16:53   Mr Brain Wo Contrast  Result Date: 11/26/2016 CLINICAL DATA:  71 y/o M; clot retrieval and carotid stent  yesterday, evaluate extent of stroke. EXAM: MRI HEAD WITHOUT CONTRAST TECHNIQUE: Multiplanar, multiecho pulse sequences of the brain and surrounding structures were obtained without intravenous contrast. COMPARISON:  11/25/2016 CT of the head and CT angiogram of the head. FINDINGS: Brain: Region of reduced diffusion compatible with acute infarction measures 3.9 x 4.1 x 4.7 cm (volume = 39 cm^3) (AP x ML x CC, series 300, image 33 and series 400, image 17). The infarct is centered in the right frontoparietal junction and insula. There are a few additional punctate scattered foci throughout the right MCA distribution, within the right occipital lobe, and within the left superior cerebellar hemisphere. Areas of infarction demonstrate associated T2 hyperintense signal abnormality and minimal local mass effect. Incomplete FLAIR suppression of sulci with minimal SWI hypointensity within the right sylvian fissure and overlying the right posterior frontal lobe infarction which may represent a combination of extravasated contrast and/or hemorrhage as seen on prior CT of the head. The distribution is stable from prior CT. No extra-axial collection, hydrocephalus, or herniation. Vascular: Normal central flow voids. Skull and upper cervical spine: Nonspecific low signal focus within the right parietal bone near the vertex measuring 20 mm (series 9, image 91) with sclerosis on CT, probably a bone island. Sinuses/Orbits: Moderate diffuse paranasal sinus mucosal thickening with small fluid levels in the sphenoid sinuses. Trace mastoid effusions. Paranasal sinus disease and mastoid effusions are probably due to intubation. Other: None. IMPRESSION: 1. Right insula and frontal parietal junction acute infarction with volume of 39 cc. Several additional punctate foci of acute infarction are present scattered throughout the right frontal lobe, right parietal lobe, right occipital lobe, and left superior cerebellar hemisphere. 2. Stable  right sylvian and posterior frontal convexity extra-axial signal abnormality from extravasated contrast and/or subarachnoid hemorrhage in comparison with prior CT given differences in technique. Electronically Signed   By: Mitzi Hansen M.D.   On: 11/26/2016 22:29   Ir Delsa Sale Stent Cerv Carotid W/o Emb-prot Mod Sed  Result Date: 11/27/2016 INDICATION: Left sided weaknee and severe dysarthria EXAM: 1. EMERGENT LARGE VESSEL OCCLUSION THROMBOLYSIS (anterior CIRCULATION) 2. 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 COMPARISON:  CT angiogram of 11/25/2016, and CT perfusion scan of 11/25/2016. MEDICATIONS: Ancef 2 g IV antibiotic was administered within 1 hour of the procedure. ANESTHESIA/SEDATION: General anesthesia. CONTRAST:  Isovue 300 approximately 120 CC. FLUOROSCOPY TIME:  Fluoroscopy Time: 51 minutes 48 seconds (2968 mGy). COMPLICATIONS: None immediate. TECHNIQUE: Following a full explanation  of the procedure along with the potential associated complications, an informed witnessed consent was obtained from the patient and the patient's wide. The risks of intracranial hemorrhage of 10%, worsening neurological deficit, ventilator dependency, death and inability to revascularize were all reviewed in detail with the patient'swife. The patient was then put under general anesthesia by the Department of Anesthesiology at [facility]. The right groin was prepped and draped in the usual sterile fashion. Thereafter using modified Seldinger technique, transfemoral access into the right common femoral artery was obtained without difficulty. Over a 0.035 inch guidewire a 5 French Pinnacle sheath was inserted. Through this, and also over a 0.035 inch guidewire a 5 [place] JB 1 catheter was advanced to the aortic arch region and selectively positioned in the right subclavian artery and the left common carotid artery, the left vertebral  artery and the right common carotid artery. FINDINGS: The right subclavian arteriogram demonstrates the origin the right vertebral artery to be normal. The vessel is seen to opacify to the cranial skull base and opacifies the right vertebrobasilar junction and right posterior-inferior cerebellar artery. The opacified portion of the basilar artery and the right posterior cerebral artery appears grossly intact on the lateral images. The right common carotid arteriogram demonstrates extensive plaque extending from the distal right common carotid artery anterior wall advancing into the proximal right external carotid artery. The external carotid artery origin is mildly narrowed. Its branches are normally opacified. The right internal carotid artery just proximal to the bulb demonstrates a severe string-like pre occlusive stenosis. There is poststenotic dilatation of the bulb itself. The delayed images demonstrate slow ascent of contrast to the cranial skull base in the right internal carotid artery. The petrous, cavernous and supraclinoid segments demonstrate wide patency. Opacification of the right middle and right anterior cerebral artery is noted with poor visualization of the superior division of the right middle cerebral artery. Non-opacified blood is seen in the right middle cerebral artery proximally and the right anterior cerebral artery A1 segment probably from the posterior circulation via the posterior communicating artery. The left vertebral artery origin is from the aortic arch between the origins of the left common carotid artery and the left subclavian arteries. The vessel is seen to opacify to the cranial skull base. Wide patency is seen of the left vertebrobasilar junction and the left posterior-inferior cerebellar artery. The basilar artery, the superior cerebellar arteries and the anterior-inferior cerebellar arteries are seen to opacify into the capillary and venous phases. Opacification of the right  posterior cerebellar P1 segment is noted. There is prompt opacification via the right posterior communicating artery of the right middle cerebral artery distribution right M1 segment and transiently the right anterior cerebral artery A1 segment. The delayed arterial phase again demonstrates a large area of hypoperfusion to absent perfusion involving the entire perisylvian triangle. There is poor attempt at retrograde opacification of the anterior 2/3 of the perisylvian branches from the pericallosal and callosal marginal branches. The inferior division appears to be widely patent with opacification to the cortical subcortical areas. The left common carotid arteriogram demonstrates the left external carotid artery and its branches to be widely patent. The left internal carotid artery at the bulb to the cranial skull base opacifies normally. The petrous, the cavernous and the supraclinoid segments demonstrate wide patency as well. A left posterior communicating artery is seen opacifying the left posterior cerebral artery distribution and transiently the left superior cerebellar artery distribution. The left middle cerebral artery and the left anterior  cerebral artery opacify normally into the capillary and venous phases. Transient cross opacification via the anterior communicating artery of the right anterior cerebral artery A2 segment and distally is noted. PROCEDURE: ENDOVASCULAR REVASCULARIZATION OF PRE OCCLUSIVE RIGHT INTERNAL CAROTID ARTERY AT THE BULB, WITH REVASCULARIZATION OF OCCLUDED SUPERIOR DIVISION OF THE RIGHT MIDDLE CEREBRAL ARTERY WITH MECHANICAL THROMBECTOMY USING 1 PASS WITH THE SOLITAIRE FR 4 MM X 40 MM RETRIEVAL DEVICE The diagnostic JB 1 catheter in the right common carotid artery was exchanged over a 0.035 inch 300 cm Rosen exchange guidewire for an 8 French 55 cm Brite tip neurovascular sheath using biplane roadmap technique and constant fluoroscopic guidance. Good aspiration was obtained from  the side port of the neurovascular sheath. This was then connected to continuous heparinized saline infusion. Over the [employer] guidewire, an 8 [place] 85 cm FlowGate balloon guide catheter which had been prepped with 50% contrast and 50% heparinized saline infusion was advanced and positioned just proximal to the right common carotid bifurcation. The guidewire was removed. Good aspiration obtained from the hub of the 8 [place] FlowGate guide catheter. A gentle contrast injection demonstrated no evidence of spasms, dissections or intraluminal filling defects. A rapid transit 2 tip microcatheter was then advanced over a 0.0141 Softtip Synchro micro guidewire to the distal end of the [facility] guide catheter. Again using biplane roadmap technique and constant fluoroscopic guidance, in a coaxial manner and with constant heparinized saline infusion, the combination of the micro guidewire and the microcatheter advanced into the origin of the right internal carotid artery. The micro guidewire with the J-tip configuration to avoid dissections or inducing spasm was then manipulated using a torque device passed the string sign stenoses without difficulty and advanced in combination with the microcatheter to the horizontal petrous segment of the right internal carotid artery. The guidewire was removed. Good aspiration obtained from the hub of the rapid transit microcatheter. This was then exchanged out for a 300 cm 0.014 inch Softtip Transend exchange micro guidewire using biplane roadmap technique and constant fluoroscopic guidance. The exchange wire tip had a J configuration to avoid dissections or inducing spasm. A control arteriogram performed through the [facility] guide catheter in the right common carotid artery demonstrated no change in the high-grade stenosis with flow noted distally into the right internal carotid artery skull base. At this time, a 4 mm x 30 mm Viatrac 14 angioplasty balloon was prepped and purged  retrogradely with heparinized saline infusion. Using the rapid exchange technique, this was then advanced and positioned such that the distal and the proximal markers were adequate distant from the site of the severe focal stenosis. A slow control inflation was then performed using a micro inflation syringe device via micro tubing to 10 atmospheres achieving a diameter of 4.08 mm where it was maintained for approximately 20 seconds. The balloon was then gently deflated and retrieved proximally and removed. A control arteriogram performed through the 8 [place] FlowGate guide catheter demonstrated significantly improved caliber and flow through the internal carotid artery and distally. 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 Over the exchange Transend EX micro guidewire, a Trevo ProVue 021 microcatheter was advanced and positioned in the proximal cavernous segment. The guidewire was removed. Good aspiration obtained from the hub of the microcatheter. Through this, a 0.014 inch Softip Synchro micro guidewire with a J tip configuration was advanced with the microcatheter to the supraclinoid right ICA. Using  a torque device the micro guidewire was then gently advanced into the origin of the superior division. The micro micro guidewire was then gently advanced without difficulty into the M2 M3 region of the superior division followed by the microcatheter. At this time the [facility] guide catheter was advanced into the origin of the right internal carotid artery. Good aspiration obtained from the hub of the microcatheter. A gentle contrast injection demonstrated safe position of the tip of the microcatheter with slow antegrade clearance of contrast. A 4 mm x 40 mm Solitaire FR retrieval device was then advanced in a coaxial manner and with constant heparinized saline infusion using biplane roadmap  technique and constant fluoroscopic guidance to the distal end of the microcatheter. The O ring on the delivery microcatheter was then gently loosened. There was slight forward gentle traction with the right hand on the delivery micro guidewire, with the left hand the delivery microcatheter was retrieved with the tip just proximal to the proximal portion of the opened retrieval device. A control arteriogram performed through the 8 [place] FlowGate guide catheter demonstrated only partial contrast advancement through the stented segment of the superior division. The balloon was then inflated in the origin of the right internal carotid artery for proximal flow arrest. The proximal portion of the retrieval device was then captured into the microcatheter. Thereafter using constant aspiration with a 60 mL syringe at the hub of the [facility] guide catheter, the combination of the microcatheter and the retrieval device was then gently retrieved and removed. Chunks of clots were noted trapped within the interstices of the retrieval device and also in the hub of the [employer] [place]. Aspiration was continued as the proximal flow arrest was released. Free back bleed of blood was noted at the hub of the 8 [place] FlowGate guide catheter. A control arteriogram performed through the 8 [place] FlowGate guide catheter demonstrated antegrade flow to the right internal carotid supraclinoid segments. Free flow was now noted through the superior division and inferior division of the right middle cerebral artery. The right anterior cerebral artery remained patent. A TICI 2b reperfusion had been established. There was no extravasation of contrast or of mass-effect on the major vessels intracranially. The rapid transit 2 tip microcatheter was then again advanced to the distal end of the 8 [place] FlowGate guide catheter just proximal to the origin of the right internal carotid artery with a 0.014 inch Softip Synchro micro guidewire. The micro  guidewire was then gently manipulated through the now angioplastied segment of the right internal carotid artery followed by the microcatheter. The combination was advanced to the horizontal petrous segment. The guidewire was removed. Good aspiration obtained from the hub of the microcatheter. This in turn was then exchanged for a 300 cm Transend Softip EX exchange micro guidewire under constant fluoroscopic guidance. The exchange guidewire had a J-tip configuration in the petrous segment. A control arteriogram performed through the 8 [place] FlowGate guide catheter continued to demonstrate excellent flow through the right internal carotid artery intracranially and proximally performed of the right common carotid artery and right internal carotid artery in their normal segments. It was elected to use a 6 mm - 8 mm x 40 mm Xact stent system. The stent carrying catheter was retrogradely flushed with heparinized saline infusion. Again using the rapid exchange technique, this was advanced under constant fluoroscopic guidance and positioned with the distal and the proximal markers of the stent optimally in relation to the angioplastied segment. This stent was then deployed under  constant fluoroscopic guidance without any difficulty. The delivery catheter was then retrieved and removed with the exchange guidewire being maintained in the petrous segment. A control arteriogram performed through 8 [place] FlowGate guide catheter demonstrated excellent apposition proximally and distally with excellent flow noted through the stented segment. A 5 mm x 30 mm Viatrac 14 angioplasty balloon was then prepped and purged with heparinized saline infusion. Again using the rapid exchange technique this was advanced with the distal and proximal markers positioned optimally in relation to the waist on the stented segment. A slow control inflation was then performed of the angioplasty catheter to 10 atmospheres achieving a diameter of 5.14  mm. This was maintained for approximately 20 seconds. The balloon was then gently deflated and retrieved and removed. A control arteriogram performed through the 8 [place] FlowGate guide catheter demonstrated excellent flow and apposition. No evidence of dissection was noted distal to the angioplastied segment of the right internal carotid artery. There was now free flow noted into the right middle cerebral artery and right anterior cerebral artery. The superior division remained widely open with a small focal area of hypoperfusion involving the posterior perisylvian branches. However, delayed retrograde opacification via the inferior division collaterals were noted. Mild caliber irregularity within the stented segment was noted on the 10 minute control arteriogram. This prompted the use of 1.8 mg of selective intra-arterial Integrilin into this stent. Control arteriograms were then performed at 20, 30 and 40 minutes post second angioplasty. These continued to demonstrate excellent flow through the stented segment of the right internal carotid artery and intracranially. The exchange micro guidewire was then gently retrieved and removed after the 20 minute control arteriogram. The 8 [place] FlowGate guide catheter and the 8 [place] Brite tip neurovascular sheath were then retrieved into the abdominal aorta and exchanged over a J-tip guidewire for a 9 [place] Pinnacle sheath. This was then connected to continuous heparinized saline infusion. The right groin appeared soft without evidence of a hematoma or bleeding. The distal pulses in both feet remained 1+ after the procedure unchanged compared to prior to the procedure. The patient was also loaded with 650 mg of aspirin and 300 mg of Plavix via an orogastric tube prior to performing the right internal carotid artery angioplasty. Also the patient was given approximately 2000 units of IV heparin at the beginning of the procedure. Patient was then transported to the CT  scanner for postprocedural CT scan of the brain. 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 1.5 mg of a selective intra-arterial Integrilin utilized within the segment of the right internal carotid artery. PLAN: Postprocedural CT scan of the brain prior to being transferred to the neuro ICU. Electronically Signed   By: Julieanne Cotton M.D.   On: 11/26/2016 21:18   Ct Cerebral Perfusion W Contrast  Result Date: 11/25/2016 CLINICAL DATA:  Initial evaluation for acute dysarthria, stroke. EXAM: CT ANGIOGRAPHY HEAD AND NECK CT PERFUSION BRAIN TECHNIQUE: Multidetector CT imaging of the head and neck was performed using the standard protocol during bolus administration of intravenous contrast. Multiplanar CT image reconstructions and MIPs were obtained to evaluate the vascular anatomy. Carotid stenosis measurements (when applicable) are obtained utilizing NASCET criteria, using the distal internal  carotid diameter as the denominator. Multiphase CT imaging of the brain was performed following IV bolus contrast injection. Subsequent parametric perfusion maps were calculated using RAPID software. CONTRAST:  100 cc of Isovue 370. COMPARISON:  Prior head CT from earlier the same day. 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 No evidence for hemorrhage or mass effect. Overall, this is stable to minimally progressed from scan performed earlier the same day and. No other acute large vessel territory infarct. No acute intracranial hemorrhage.  No mass lesion, midline shift or mass effect. No hydrocephalus. No extra-axial fluid collection. Vascular: Focal hyperdensity within distal right M1/proximal right M2 branches, consistent with thrombus. Vascular calcifications noted within the carotid siphons. Skull: Scalp soft tissues and calvarium within normal limits. Sinuses/Orbits: Globes and orbital soft tissues within normal limits. Mild mucosal thickening within the maxillary sinuses. Paranasal sinuses are otherwise clear. No mastoid effusion. ASPECTS [facility] Stroke Program Early CT Score) - Ganglionic level infarction (caudate, lentiform nuclei, internal capsule, insula, M1-M3 cortex): 5 - Supraganglionic infarction (M4-M6 cortex): 2 Total score (0-10 with 10 being normal): 7 Review of the MIP images confirms the above findings CTA NECK FINDINGS Aortic arch: Visualized aortic arch of normal caliber with normal branch pattern. Minimal plaque within the arch itself. No flow-limiting stenosis about the origin of the great vessels. Visualized subclavian arteries widely patent. Right carotid system: Right common carotid artery widely patent from its origin to the bifurcation. A centric calcified plaque about the right bifurcation/proximal right ICA. There is abrupt near occlusion of the proximal right ICA at the bifurcation (series 12, image 202). A focal string sign is present. Intraluminal filling defect just distally consistent with thrombus. Area of involvement begins at the bifurcation and measures approximately 15 mm in length. A focal ruptured plaque with associated acute dissection is suspected. Distally, the right ICA is somewhat diminutive in caliber and irregular, but is patent to the skullbase without additional focal stenosis or other vascular abnormality. Left carotid system: Left common carotid artery widely patent from its origin to the bifurcation. Minimal plaque about the left bifurcation without stenosis. Left ICA widely patent distally to  the skullbase without stenosis, dissection, or occlusion. Vertebral arteries: Left vertebral artery arises separately from the aortic arch. Right vertebral artery rises from the right subclavian artery. Right vertebral artery slightly dominant. Vertebral arteries patent within the neck without stenosis, dissection, or occlusion. Skeleton: No acute osseus abnormality. No worrisome lytic or blastic osseous lesions. Mild to moderate multilevel degenerative spondylolysis within the cervical spine, greatest at C5-6. Other neck: No acute soft tissue abnormality within the neck. Salivary glands normal. No adenopathy. Thyroid normal. Upper chest: Visualized upper chest within normal limits. Visualized lungs are clear. Review of the MIP images confirms the above findings CTA HEAD FINDINGS Anterior circulation: Petrous, cavernous, and supraclinoid segments patent bilaterally without flow-limiting stenosis. Minimal plaque noted within the cavernous ICAs. ICA termini widely patent. A1 segments patent bilaterally. Anterior communicating artery normal. Anterior cerebral artery is patent to their distal aspects. Right M1 segment patent to its distal aspect. There is occlusion of proximal right M2 branch at the base of the right sylvian fissure (series 12, image 96), likely embolic from thrombus seen in the neck. Right MCA branches demonstrate multifocal irregularity with stenoses distally. Left M1 segment patent to its distal aspect. No proximal left M2 occlusion. Left MCA branches well opacified to their distal  aspects. Posterior circulation: Vertebral artery's patent to the vertebrobasilar junction without stenosis. Patent left PICA. Dominant right AICA. Basilar artery widely patent to its distal aspect. Superior cerebral arteries patent bilaterally. PCA supplied via the basilar as well is prominent bilateral posterior communicating arteries. PCAs patent to their distal aspects. Venous sinuses: Patent. Anatomic variants: No  significant anatomic variant. No aneurysm or vascular malformation. Delayed phase: Not performed. Review of the MIP images confirms the above findings CT Brain Perfusion Findings: CBF (<30%) Volume: 0mL Perfusion (Tmax>6.0s) volume: 86mL Mismatch Volume: 86mL Infarction Location:While no core infarct is registered, there is evidence of evolving ischemia within the right insula and frontal operculum on prior noncontrast head CT. This is suspected to be mass by collateralization. Evidence of moderate surrounding penumbra within the right frontal lobe on associated perfusion map. IMPRESSION: 1. 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 Right ICA somewhat diffusely narrow and irregular distally to the skullbase. 2. Positive CTA for emergent large vessel occlusion with right M2 occlusion and evolving right frontal lobe infarct involving the right insula and frontal operculum. Moderate surrounding penumbra as above. 3. Additional mild for age atherosclerotic changes above. No other high-grade or correctable stenosis. Critical Value/emergent results were called by telephone at the time of interpretation on 11/25/2016 at 4:16 pm to Dr. Milon Dikes , who verbally acknowledged these results. Electronically Signed   By: Rise Mu M.D.   On: 11/25/2016 16:53   Dg Chest Port 1 View  Result Date: 11/27/2016 CLINICAL DATA:  Acute CVA, intubated patient EXAM: PORTABLE CHEST 1 VIEW COMPARISON:  Portable chest x-ray of November 25, 2016 FINDINGS: The lungs are adequately inflated. There is subsegmental atelectasis in the perihilar regions greatest on the left. There is no alveolar infiltrate. There is no pleural effusion or pneumothorax. The heart and pulmonary vascularity are normal. The endotracheal tube tip lies approximately 4.8 cm above the carina. The esophagogastric tube tip  and proximal port project below the inferior margin of the image. IMPRESSION: Perihilar subsegmental atelectasis. No pneumonia nor pulmonary edema. The support tubes are in reasonable position. Electronically Signed   By: David  [place] M.D.   On: 11/27/2016 07:22   Portable Chest Xray  Result Date: 11/25/2016 CLINICAL DATA:  Intubation EXAM: PORTABLE CHEST 1 VIEW COMPARISON:  None. FINDINGS: Endotracheal tube tip is about 4.3 cm superior to the carina. Esophageal tube tip is in the left upper quadrant. Low lung volumes. Atelectasis or mild infiltrate at the left lung base. Normal heart size. Mediastinal with upper normal, likely augmented by low lung volume. No pneumothorax. IMPRESSION: 1. Endotracheal tube tip about 4.3 cm superior to carina 2. Patchy atelectasis or infiltrate at the left lung base 3. Upper normal mediastinal with, likely augmented by low lung volume. Electronically Signed   By: Jasmine Pang M.D.   On: 11/25/2016 20:40   Dg Abd Portable 1v  Result Date: 11/27/2016 CLINICAL DATA:  Feeding tube placement EXAM: PORTABLE ABDOMEN - 1 VIEW COMPARISON:  None. FINDINGS: Feeding tube tip is in the distal stomach. Nonobstructive bowel gas pattern with mild gaseous distention of bowel diffusely. IMPRESSION: Feeding tube tip in the distal stomach. Electronically Signed   By: Charlett Nose M.D.   On: 11/27/2016 14:39   Dg Swallowing Func-speech Pathology  Result Date: 11/28/2016 Objective Swallowing Evaluation: Type of Study: MBS-Modified Barium Swallow Study Patient Details Name: Ilija Maxim MRN: [id number] Date of  Birth: 03/19/1946 Today's Date: 11/28/2016 Time: SLP Start Time (ACUTE ONLY): 1120-SLP Stop Time (ACUTE ONLY): 1240 SLP Time Calculation (min) (ACUTE ONLY): 80 min Past Medical History: Past Medical History: Diagnosis Date . Glaucoma  . Hepatitis C  Past Surgical History: Past Surgical History: Procedure Laterality Date . IR ANGIO INTRA EXTRACRAN SEL COM CAROTID INNOMINATE UNI L MOD SED   11/25/2016 . IR ANGIO VERTEBRAL SEL SUBCLAVIAN INNOMINATE UNI R MOD SED  11/25/2016 . IR ANGIO VERTEBRAL SEL VERTEBRAL UNI L MOD SED  11/25/2016 . IR INTRAVSC STENT CERV CAROTID W/O EMB-PROT MOD SED INC ANGIO  11/25/2016 . IR PERCUTANEOUS ART THROMBECTOMY/INFUSION INTRACRANIAL INC DIAG ANGIO  11/25/2016 . RADIOLOGY WITH ANESTHESIA N/A 11/25/2016  Procedure: RADIOLOGY WITH ANESTHESIA;  Surgeon: Radiologist, Medication, MD;  Location: [facility] OR;  Service: Radiology;  Laterality: N/A; HPI: 71 year old male with PMH as below, which is significant for Hepatitic C s/p treatment with Harvoni. He was last known normal at 2am on 7/23 when he went to sleep. He woke up around 9am with left sided weakness, facial droop, and slurred speech. 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 He was transferred to [facility] for IR evaluation. He underwent successful revascularization of R MCA and R ICA. Post-procedurally he remained on ventilator and was transferred to ICU. Concern for potential hemorrhage post IR proedure, but may have been extravasation of dye and not a bleed. Intubated 7/23 to 7/25 in am. 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 Pt is left handed.  No Data Recorded Assessment / Plan / Recommendation CHL IP CLINICAL IMPRESSIONS 11/28/2016 Clinical Impression Despite significant left lingual weakness and left labial/facial sensory loss, pt is able to tolerate thin liquids and masticate regular solids without oral residuals. There is mild anterior spillage, but no retained barium in the buccal cavity. Oropharyngeal phase WNL. Primary concern is pt biting his tongue during mastication, possibly without awareness. Recommend pt attempt a dys 3 mechanical soft diet with thin liquids, but will downgrade texture as needed if he cannot manage his tongue. May also need to attempt compensatory strategies for anterior loss. Will follow for tolerance.  SLP Visit  Diagnosis Dysphagia, oral phase (R13.11) Attention and concentration deficit following -- Frontal lobe and executive function deficit following -- Impact on safety and function Mild aspiration risk   CHL IP TREATMENT RECOMMENDATION 11/28/2016 Treatment Recommendations Therapy as outlined in treatment plan below   Prognosis 11/28/2016 Prognosis for Safe Diet Advancement Good Barriers to Reach Goals -- Barriers/Prognosis Comment -- CHL IP DIET RECOMMENDATION 11/28/2016 SLP Diet Recommendations Dysphagia 3 (Mech soft) solids;Thin liquid Liquid Administration via Cup;Straw Medication Administration Whole meds with liquid Compensations Other (Comment) Postural Changes Remain semi-upright after after feeds/meals (Comment);Seated upright at 90 degrees   CHL IP OTHER RECOMMENDATIONS 11/28/2016 Recommended Consults -- Oral Care Recommendations Oral care BID Other Recommendations --   CHL IP FOLLOW UP RECOMMENDATIONS 11/28/2016 Follow up Recommendations Inpatient Rehab   CHL IP FREQUENCY AND DURATION 11/28/2016 Speech Therapy Frequency (ACUTE ONLY) min 2x/week Treatment Duration 2 weeks      CHL IP ORAL PHASE 11/28/2016 Oral Phase Impaired Oral - Pudding Teaspoon -- Oral - Pudding Cup -- Oral - Honey Teaspoon -- Oral - Honey Cup -- Oral - Nectar Teaspoon -- Oral - Nectar Cup -- Oral - Nectar Straw -- Oral - Thin Teaspoon -- Oral - Thin Cup Left anterior bolus loss Oral - Thin Straw  Left anterior bolus loss Oral - Puree WFL Oral - Mech Soft -- Oral - Regular WFL Oral - Multi-Consistency -- Oral - Pill WFL Oral Phase - Comment --  CHL IP PHARYNGEAL PHASE 11/28/2016 Pharyngeal Phase WFL Pharyngeal- Pudding Teaspoon -- Pharyngeal -- Pharyngeal- Pudding Cup -- Pharyngeal -- Pharyngeal- Honey Teaspoon -- Pharyngeal -- Pharyngeal- Honey Cup -- Pharyngeal -- Pharyngeal- Nectar Teaspoon -- Pharyngeal -- Pharyngeal- Nectar Cup -- Pharyngeal -- Pharyngeal- Nectar Straw -- Pharyngeal -- Pharyngeal- Thin Teaspoon -- Pharyngeal -- Pharyngeal- Thin  Cup -- Pharyngeal -- Pharyngeal- Thin Straw -- Pharyngeal -- Pharyngeal- Puree -- Pharyngeal -- Pharyngeal- Mechanical Soft -- Pharyngeal -- Pharyngeal- Regular -- Pharyngeal -- Pharyngeal- Multi-consistency -- Pharyngeal -- Pharyngeal- Pill -- Pharyngeal -- Pharyngeal Comment --  No flowsheet data found. No flowsheet data found. Harlon DittyBonnie DeBlois, MA CCC-SLP [phone] [name], Bonnie [name] 11/28/2016, 12:20 PM              Ir Percutaneous Art Thrombectomy/infusion Intracranial Inc Diag Angio  Result Date: 11/27/2016 INDICATION: Left sided weaknee and severe dysarthria EXAM: 1. EMERGENT LARGE VESSEL OCCLUSION THROMBOLYSIS (anterior CIRCULATION) 2. 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 COMPARISON:  CT angiogram of 11/25/2016, and CT perfusion scan of 11/25/2016. MEDICATIONS: Ancef 2 g IV antibiotic was administered within 1 hour of the procedure. ANESTHESIA/SEDATION: General anesthesia. CONTRAST:  Isovue 300 approximately 120 CC. FLUOROSCOPY TIME:  Fluoroscopy Time: 51 minutes 48 seconds (2968 mGy). COMPLICATIONS: None immediate. TECHNIQUE: Following a full explanation of the procedure along with the potential associated complications, an informed witnessed consent was obtained from the patient and the patient's wide. The risks of intracranial hemorrhage of 10%, worsening neurological deficit, ventilator dependency, death and inability to revascularize were all reviewed in detail with the patient'swife. The patient was then put under general anesthesia by the [facility]. The right groin was prepped and draped in the usual sterile fashion. Thereafter using modified Seldinger technique, transfemoral access into the right common femoral artery was obtained without difficulty. Over a 0.035 inch guidewire a 5 French Pinnacle sheath was inserted. Through this, and also over a 0.035 inch guidewire a 5 [place]  JB 1 catheter was advanced to the aortic arch region and selectively positioned in the right subclavian artery and the left common carotid artery, the left vertebral artery and the right common carotid artery. FINDINGS: The right subclavian arteriogram demonstrates the origin the right vertebral artery to be normal. The vessel is seen to opacify to the cranial skull base and opacifies the right vertebrobasilar junction and right posterior-inferior cerebellar artery. The opacified portion of the basilar artery and the right posterior cerebral artery appears grossly intact on the lateral images. The right common carotid arteriogram demonstrates extensive plaque extending from the distal right common carotid artery anterior wall advancing into the proximal right external carotid artery. The external carotid artery origin is mildly narrowed. Its branches are normally opacified. The right internal carotid artery just proximal to the bulb demonstrates a severe string-like pre occlusive stenosis. There is poststenotic dilatation of the bulb itself. The delayed images demonstrate slow ascent of contrast to the cranial skull base in the right internal carotid artery. The petrous, cavernous and supraclinoid segments demonstrate wide patency. Opacification of the right middle and right anterior cerebral artery is noted with poor visualization of the superior division of the right middle cerebral artery. Non-opacified blood is seen in the right middle cerebral artery proximally and the right  anterior cerebral artery A1 segment probably from the posterior circulation via the posterior communicating artery. The left vertebral artery origin is from the aortic arch between the origins of the left common carotid artery and the left subclavian arteries. The vessel is seen to opacify to the cranial skull base. Wide patency is seen of the left vertebrobasilar junction and the left posterior-inferior cerebellar artery. The basilar  artery, the superior cerebellar arteries and the anterior-inferior cerebellar arteries are seen to opacify into the capillary and venous phases. Opacification of the right posterior cerebellar P1 segment is noted. There is prompt opacification via the right posterior communicating artery of the right middle cerebral artery distribution right M1 segment and transiently the right anterior cerebral artery A1 segment. The delayed arterial phase again demonstrates a large area of hypoperfusion to absent perfusion involving the entire perisylvian triangle. There is poor attempt at retrograde opacification of the anterior 2/3 of the perisylvian branches from the pericallosal and callosal marginal branches. The inferior division appears to be widely patent with opacification to the cortical subcortical areas. The left common carotid arteriogram demonstrates the left external carotid artery and its branches to be widely patent. The left internal carotid artery at the bulb to the cranial skull base opacifies normally. The petrous, the cavernous and the supraclinoid segments demonstrate wide patency as well. A left posterior communicating artery is seen opacifying the left posterior cerebral artery distribution and transiently the left superior cerebellar artery distribution. The left middle cerebral artery and the left anterior cerebral artery opacify normally into the capillary and venous phases. Transient cross opacification via the anterior communicating artery of the right anterior cerebral artery A2 segment and distally is noted. PROCEDURE: ENDOVASCULAR REVASCULARIZATION OF PRE OCCLUSIVE RIGHT INTERNAL CAROTID ARTERY AT THE BULB, WITH REVASCULARIZATION OF OCCLUDED SUPERIOR DIVISION OF THE RIGHT MIDDLE CEREBRAL ARTERY WITH MECHANICAL THROMBECTOMY USING 1 PASS WITH THE SOLITAIRE FR 4 MM X 40 MM RETRIEVAL DEVICE The diagnostic JB 1 catheter in the right common carotid artery was exchanged over a 0.035 inch 300 cm Rosen  exchange guidewire for an 8 French 55 cm Brite tip neurovascular sheath using biplane roadmap technique and constant fluoroscopic guidance. Good aspiration was obtained from the side port of the neurovascular sheath. This was then connected to continuous heparinized saline infusion. Over the [employer] guidewire, an 8 [place] 85 cm FlowGate balloon guide catheter which had been prepped with 50% contrast and 50% heparinized saline infusion was advanced and positioned just proximal to the right common carotid bifurcation. The guidewire was removed. Good aspiration obtained from the hub of the 8 [place] FlowGate guide catheter. A gentle contrast injection demonstrated no evidence of spasms, dissections or intraluminal filling defects. A rapid transit 2 tip microcatheter was then advanced over a 0.0141 Softtip Synchro micro guidewire to the distal end of the [facility] guide catheter. Again using biplane roadmap technique and constant fluoroscopic guidance, in a coaxial manner and with constant heparinized saline infusion, the combination of the micro guidewire and the microcatheter advanced into the origin of the right internal carotid artery. The micro guidewire with the J-tip configuration to avoid dissections or inducing spasm was then manipulated using a torque device passed the string sign stenoses without difficulty and advanced in combination with the microcatheter to the horizontal petrous segment of the right internal carotid artery. The guidewire was removed. Good aspiration obtained from the hub of the rapid transit microcatheter. This was then exchanged out for a 300 cm 0.014 inch Softtip Transend exchange  micro guidewire using biplane roadmap technique and constant fluoroscopic guidance. The exchange wire tip had a J configuration to avoid dissections or inducing spasm. A control arteriogram performed through the [facility] guide catheter in the right common carotid artery demonstrated no change in the  high-grade stenosis with flow noted distally into the right internal carotid artery skull base. At this time, a 4 mm x 30 mm Viatrac 14 angioplasty balloon was prepped and purged retrogradely with heparinized saline infusion. Using the rapid exchange technique, this was then advanced and positioned such that the distal and the proximal markers were adequate distant from the site of the severe focal stenosis. A slow control inflation was then performed using a micro inflation syringe device via micro tubing to 10 atmospheres achieving a diameter of 4.08 mm where it was maintained for approximately 20 seconds. The balloon was then gently deflated and retrieved proximally and removed. A control arteriogram performed through the 8 [place] FlowGate guide catheter demonstrated significantly improved caliber and flow through the internal carotid artery and distally. 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 Over the exchange Transend EX micro guidewire, a Trevo ProVue 021 microcatheter was advanced and positioned in the proximal cavernous segment. The guidewire was removed. Good aspiration obtained from the hub of the microcatheter. Through this, a 0.014 inch Softip Synchro micro guidewire with a J tip configuration was advanced with the microcatheter to the supraclinoid right ICA. Using a torque device the micro guidewire was then gently advanced into the origin of the superior division. The micro micro guidewire was then gently advanced without difficulty into the M2 M3 region of the superior division followed by the microcatheter. At this time the [facility] guide catheter was advanced into the origin of the right internal carotid artery. Good aspiration obtained from the hub of the microcatheter. A gentle contrast injection demonstrated safe position of the tip of the microcatheter with slow antegrade  clearance of contrast. A 4 mm x 40 mm Solitaire FR retrieval device was then advanced in a coaxial manner and with constant heparinized saline infusion using biplane roadmap technique and constant fluoroscopic guidance to the distal end of the microcatheter. The O ring on the delivery microcatheter was then gently loosened. There was slight forward gentle traction with the right hand on the delivery micro guidewire, with the left hand the delivery microcatheter was retrieved with the tip just proximal to the proximal portion of the opened retrieval device. A control arteriogram performed through the 8 [place] FlowGate guide catheter demonstrated only partial contrast advancement through the stented segment of the superior division. The balloon was then inflated in the origin of the right internal carotid artery for proximal flow arrest. The proximal portion of the retrieval device was then captured into the microcatheter. Thereafter using constant aspiration with a 60 mL syringe at the hub of the [facility] guide catheter, the combination of the microcatheter and the retrieval device was then gently retrieved and removed. Chunks of clots were noted trapped within the interstices of the retrieval device and also in the hub of the [employer]. Aspiration was continued as the proximal flow arrest was released. Free back bleed of blood was noted at the hub of the 8 [place] FlowGate guide catheter. A control arteriogram performed through the 8 [place] FlowGate guide catheter demonstrated antegrade flow to the right internal carotid supraclinoid segments. Free flow was now noted through the superior division  and inferior division of the right middle cerebral artery. The right anterior cerebral artery remained patent. A TICI 2b reperfusion had been established. There was no extravasation of contrast or of mass-effect on the major vessels intracranially. The rapid transit 2 tip microcatheter was then again advanced to the distal  end of the 8 [place] FlowGate guide catheter just proximal to the origin of the right internal carotid artery with a 0.014 inch Softip Synchro micro guidewire. The micro guidewire was then gently manipulated through the now angioplastied segment of the right internal carotid artery followed by the microcatheter. The combination was advanced to the horizontal petrous segment. The guidewire was removed. Good aspiration obtained from the hub of the microcatheter. This in turn was then exchanged for a 300 cm Transend Softip EX exchange micro guidewire under constant fluoroscopic guidance. The exchange guidewire had a J-tip configuration in the petrous segment. A control arteriogram performed through the 8 [place] FlowGate guide catheter continued to demonstrate excellent flow through the right internal carotid artery intracranially and proximally performed of the right common carotid artery and right internal carotid artery in their normal segments. It was elected to use a 6 mm - 8 mm x 40 mm Xact stent system. The stent carrying catheter was retrogradely flushed with heparinized saline infusion. Again using the rapid exchange technique, this was advanced under constant fluoroscopic guidance and positioned with the distal and the proximal markers of the stent optimally in relation to the angioplastied segment. This stent was then deployed under constant fluoroscopic guidance without any difficulty. The delivery catheter was then retrieved and removed with the exchange guidewire being maintained in the petrous segment. A control arteriogram performed through 8 [place] FlowGate guide catheter demonstrated excellent apposition proximally and distally with excellent flow noted through the stented segment. A 5 mm x 30 mm Viatrac 14 angioplasty balloon was then prepped and purged with heparinized saline infusion. Again using the rapid exchange technique this was advanced with the distal and proximal markers positioned optimally  in relation to the waist on the stented segment. A slow control inflation was then performed of the angioplasty catheter to 10 atmospheres achieving a diameter of 5.14 mm. This was maintained for approximately 20 seconds. The balloon was then gently deflated and retrieved and removed. A control arteriogram performed through the 8 [place] FlowGate guide catheter demonstrated excellent flow and apposition. No evidence of dissection was noted distal to the angioplastied segment of the right internal carotid artery. There was now free flow noted into the right middle cerebral artery and right anterior cerebral artery. The superior division remained widely open with a small focal area of hypoperfusion involving the posterior perisylvian branches. However, delayed retrograde opacification via the inferior division collaterals were noted. Mild caliber irregularity within the stented segment was noted on the 10 minute control arteriogram. This prompted the use of 1.8 mg of selective intra-arterial Integrilin into this stent. Control arteriograms were then performed at 20, 30 and 40 minutes post second angioplasty. These continued to demonstrate excellent flow through the stented segment of the right internal carotid artery and intracranially. The exchange micro guidewire was then gently retrieved and removed after the 20 minute control arteriogram. The 8 French FlowGate guide catheter and the 8 [place] Brite tip neurovascular sheath were then retrieved into the abdominal aorta and exchanged over a J-tip guidewire for a 9 [place] Pinnacle sheath. This was then connected to continuous heparinized saline infusion. The right groin appeared soft without evidence of a hematoma or  bleeding. The distal pulses in both feet remained 1+ after the procedure unchanged compared to prior to the procedure. The patient was also loaded with 650 mg of aspirin and 300 mg of Plavix via an orogastric tube prior to performing the right internal  carotid artery angioplasty. Also the patient was given approximately 2000 units of IV heparin at the beginning of the procedure. Patient was then transported to the CT scanner for postprocedural CT scan of the brain. 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 1.5 mg of a selective intra-arterial Integrilin utilized within the segment of the right internal carotid artery. PLAN: Postprocedural CT scan of the brain prior to being transferred to the neuro ICU. Electronically Signed   By: Julieanne Cotton M.D.   On: 11/26/2016 21:18   Ir Angio Intra Extracran Sel Com Carotid Innominate Uni L Mod Sed  Result Date: 11/27/2016 INDICATION: Left sided weaknee and severe dysarthria EXAM: 1. EMERGENT LARGE VESSEL OCCLUSION THROMBOLYSIS (anterior CIRCULATION) 2. 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 COMPARISON:  CT angiogram of 11/25/2016, and CT perfusion scan of 11/25/2016. MEDICATIONS: Ancef 2 g IV antibiotic was administered within 1 hour of the procedure. ANESTHESIA/SEDATION: General anesthesia. CONTRAST:  Isovue 300 approximately 120 CC. FLUOROSCOPY TIME:  Fluoroscopy Time: 51 minutes 48 seconds (2968 mGy). COMPLICATIONS: None immediate. TECHNIQUE: Following a full explanation of the procedure along with the potential associated complications, an informed witnessed consent was obtained from the patient and the patient's wide. The risks of intracranial hemorrhage of 10%, worsening neurological deficit, ventilator dependency, death and inability to revascularize were all reviewed in detail with  the patient'swife. The patient was then put under general anesthesia by the Department of Anesthesiology at [facility]. The right groin was prepped and draped in the usual sterile fashion. Thereafter using modified Seldinger technique, transfemoral access into the right common femoral artery was obtained without difficulty. Over a 0.035 inch guidewire a 5 French Pinnacle sheath was inserted. Through this, and also over a 0.035 inch guidewire a 5 [place] JB 1 catheter was advanced to the aortic arch region and selectively positioned in the right subclavian artery and the left common carotid artery, the left vertebral artery and the right common carotid artery. FINDINGS: The right subclavian arteriogram demonstrates the origin the right vertebral artery to be normal. The vessel is seen to opacify to the cranial skull base and opacifies the right vertebrobasilar junction and right posterior-inferior cerebellar artery. The opacified portion of the basilar artery and the right posterior cerebral artery appears grossly intact on the lateral images. The right common carotid arteriogram demonstrates extensive plaque extending from the distal right common carotid artery anterior wall advancing into the proximal right external carotid artery. The external carotid artery origin is mildly narrowed. Its branches are normally opacified. The right internal carotid artery just proximal to the bulb demonstrates a severe string-like pre occlusive stenosis. There is poststenotic dilatation of the bulb itself. The delayed images demonstrate slow ascent of contrast to the cranial skull base in the right internal carotid artery. The petrous, cavernous and supraclinoid segments demonstrate wide patency. Opacification of the right middle and right anterior cerebral artery is noted  with poor visualization of the superior division of the right middle cerebral artery. Non-opacified blood is seen in the right middle cerebral artery  proximally and the right anterior cerebral artery A1 segment probably from the posterior circulation via the posterior communicating artery. The left vertebral artery origin is from the aortic arch between the origins of the left common carotid artery and the left subclavian arteries. The vessel is seen to opacify to the cranial skull base. Wide patency is seen of the left vertebrobasilar junction and the left posterior-inferior cerebellar artery. The basilar artery, the superior cerebellar arteries and the anterior-inferior cerebellar arteries are seen to opacify into the capillary and venous phases. Opacification of the right posterior cerebellar P1 segment is noted. There is prompt opacification via the right posterior communicating artery of the right middle cerebral artery distribution right M1 segment and transiently the right anterior cerebral artery A1 segment. The delayed arterial phase again demonstrates a large area of hypoperfusion to absent perfusion involving the entire perisylvian triangle. There is poor attempt at retrograde opacification of the anterior 2/3 of the perisylvian branches from the pericallosal and callosal marginal branches. The inferior division appears to be widely patent with opacification to the cortical subcortical areas. The left common carotid arteriogram demonstrates the left external carotid artery and its branches to be widely patent. The left internal carotid artery at the bulb to the cranial skull base opacifies normally. The petrous, the cavernous and the supraclinoid segments demonstrate wide patency as well. A left posterior communicating artery is seen opacifying the left posterior cerebral artery distribution and transiently the left superior cerebellar artery distribution. The left middle cerebral artery and the left anterior cerebral artery opacify normally into the capillary and venous phases. Transient cross opacification via the anterior communicating artery of the  right anterior cerebral artery A2 segment and distally is noted. PROCEDURE: ENDOVASCULAR REVASCULARIZATION OF PRE OCCLUSIVE RIGHT INTERNAL CAROTID ARTERY AT THE BULB, WITH REVASCULARIZATION OF OCCLUDED SUPERIOR DIVISION OF THE RIGHT MIDDLE CEREBRAL ARTERY WITH MECHANICAL THROMBECTOMY USING 1 PASS WITH THE SOLITAIRE FR 4 MM X 40 MM RETRIEVAL DEVICE The diagnostic JB 1 catheter in the right common carotid artery was exchanged over a 0.035 inch 300 cm Rosen exchange guidewire for an 8 French 55 cm Brite tip neurovascular sheath using biplane roadmap technique and constant fluoroscopic guidance. Good aspiration was obtained from the side port of the neurovascular sheath. This was then connected to continuous heparinized saline infusion. Over the [employer] guidewire, an 8 [place] 85 cm FlowGate balloon guide catheter which had been prepped with 50% contrast and 50% heparinized saline infusion was advanced and positioned just proximal to the right common carotid bifurcation. The guidewire was removed. Good aspiration obtained from the hub of the 8 [place] FlowGate guide catheter. A gentle contrast injection demonstrated no evidence of spasms, dissections or intraluminal filling defects. A rapid transit 2 tip microcatheter was then advanced over a 0.0141 Softtip Synchro micro guidewire to the distal end of the [facility] guide catheter. Again using biplane roadmap technique and constant fluoroscopic guidance, in a coaxial manner and with constant heparinized saline infusion, the combination of the micro guidewire and the microcatheter advanced into the origin of the right internal carotid artery. The micro guidewire with the J-tip configuration to avoid dissections or inducing spasm was then manipulated using a torque device passed the string sign stenoses without difficulty and advanced in combination with the microcatheter to the horizontal petrous segment of the right internal carotid artery. The guidewire  was removed.  Good aspiration obtained from the hub of the rapid transit microcatheter. This was then exchanged out for a 300 cm 0.014 inch Softtip Transend exchange micro guidewire using biplane roadmap technique and constant fluoroscopic guidance. The exchange wire tip had a J configuration to avoid dissections or inducing spasm. A control arteriogram performed through the [facility] guide catheter in the right common carotid artery demonstrated no change in the high-grade stenosis with flow noted distally into the right internal carotid artery skull base. At this time, a 4 mm x 30 mm Viatrac 14 angioplasty balloon was prepped and purged retrogradely with heparinized saline infusion. Using the rapid exchange technique, this was then advanced and positioned such that the distal and the proximal markers were adequate distant from the site of the severe focal stenosis. A slow control inflation was then performed using a micro inflation syringe device via micro tubing to 10 atmospheres achieving a diameter of 4.08 mm where it was maintained for approximately 20 seconds. The balloon was then gently deflated and retrieved proximally and removed. A control arteriogram performed through the 8 [place] FlowGate guide catheter demonstrated significantly improved caliber and flow through the internal carotid artery and distally. 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 Over the exchange Transend EX micro guidewire, a Trevo ProVue 021 microcatheter was advanced and positioned in the proximal cavernous segment. The guidewire was removed. Good aspiration obtained from the hub of the microcatheter. Through this, a 0.014 inch Softip Synchro micro guidewire with a J tip configuration was advanced with the microcatheter to the supraclinoid right ICA. Using a torque device the micro guidewire was then gently advanced into the origin  of the superior division. The micro micro guidewire was then gently advanced without difficulty into the M2 M3 region of the superior division followed by the microcatheter. At this time the [facility] guide catheter was advanced into the origin of the right internal carotid artery. Good aspiration obtained from the hub of the microcatheter. A gentle contrast injection demonstrated safe position of the tip of the microcatheter with slow antegrade clearance of contrast. A 4 mm x 40 mm Solitaire FR retrieval device was then advanced in a coaxial manner and with constant heparinized saline infusion using biplane roadmap technique and constant fluoroscopic guidance to the distal end of the microcatheter. The O ring on the delivery microcatheter was then gently loosened. There was slight forward gentle traction with the right hand on the delivery micro guidewire, with the left hand the delivery microcatheter was retrieved with the tip just proximal to the proximal portion of the opened retrieval device. A control arteriogram performed through the 8 [place] FlowGate guide catheter demonstrated only partial contrast advancement through the stented segment of the superior division. The balloon was then inflated in the origin of the right internal carotid artery for proximal flow arrest. The proximal portion of the retrieval device was then captured into the microcatheter. Thereafter using constant aspiration with a 60 mL syringe at the hub of the [facility] guide catheter, the combination of the microcatheter and the retrieval device was then gently retrieved and removed. Chunks of clots were noted trapped within the interstices of the retrieval device and also in the hub of the [employer]. Aspiration was continued as the proximal flow arrest was released. Free back bleed of blood was noted at the hub of the 8 [place] FlowGate guide catheter. A control arteriogram  performed through the 8 [place] FlowGate guide catheter  demonstrated antegrade flow to the right internal carotid supraclinoid segments. Free flow was now noted through the superior division and inferior division of the right middle cerebral artery. The right anterior cerebral artery remained patent. A TICI 2b reperfusion had been established. There was no extravasation of contrast or of mass-effect on the major vessels intracranially. The rapid transit 2 tip microcatheter was then again advanced to the distal end of the 8 [place] FlowGate guide catheter just proximal to the origin of the right internal carotid artery with a 0.014 inch Softip Synchro micro guidewire. The micro guidewire was then gently manipulated through the now angioplastied segment of the right internal carotid artery followed by the microcatheter. The combination was advanced to the horizontal petrous segment. The guidewire was removed. Good aspiration obtained from the hub of the microcatheter. This in turn was then exchanged for a 300 cm Transend Softip EX exchange micro guidewire under constant fluoroscopic guidance. The exchange guidewire had a J-tip configuration in the petrous segment. A control arteriogram performed through the 8 [place] FlowGate guide catheter continued to demonstrate excellent flow through the right internal carotid artery intracranially and proximally performed of the right common carotid artery and right internal carotid artery in their normal segments. It was elected to use a 6 mm - 8 mm x 40 mm Xact stent system. The stent carrying catheter was retrogradely flushed with heparinized saline infusion. Again using the rapid exchange technique, this was advanced under constant fluoroscopic guidance and positioned with the distal and the proximal markers of the stent optimally in relation to the angioplastied segment. This stent was then deployed under constant fluoroscopic guidance without any difficulty. The delivery catheter was then retrieved and removed with the exchange  guidewire being maintained in the petrous segment. A control arteriogram performed through 8 [place] FlowGate guide catheter demonstrated excellent apposition proximally and distally with excellent flow noted through the stented segment. A 5 mm x 30 mm Viatrac 14 angioplasty balloon was then prepped and purged with heparinized saline infusion. Again using the rapid exchange technique this was advanced with the distal and proximal markers positioned optimally in relation to the waist on the stented segment. A slow control inflation was then performed of the angioplasty catheter to 10 atmospheres achieving a diameter of 5.14 mm. This was maintained for approximately 20 seconds. The balloon was then gently deflated and retrieved and removed. A control arteriogram performed through the 8 [place] FlowGate guide catheter demonstrated excellent flow and apposition. No evidence of dissection was noted distal to the angioplastied segment of the right internal carotid artery. There was now free flow noted into the right middle cerebral artery and right anterior cerebral artery. The superior division remained widely open with a small focal area of hypoperfusion involving the posterior perisylvian branches. However, delayed retrograde opacification via the inferior division collaterals were noted. Mild caliber irregularity within the stented segment was noted on the 10 minute control arteriogram. This prompted the use of 1.8 mg of selective intra-arterial Integrilin into this stent. Control arteriograms were then performed at 20, 30 and 40 minutes post second angioplasty. These continued to demonstrate excellent flow through the stented segment of the right internal carotid artery and intracranially. The exchange micro guidewire was then gently retrieved and removed after the 20 minute control arteriogram. The 8 [place] FlowGate guide catheter and the 8 [place] Brite tip neurovascular sheath were then retrieved into the abdominal  aorta and exchanged over a  J-tip guidewire for a 9 French Pinnacle sheath. This was then connected to continuous heparinized saline infusion. The right groin appeared soft without evidence of a hematoma or bleeding. The distal pulses in both feet remained 1+ after the procedure unchanged compared to prior to the procedure. The patient was also loaded with 650 mg of aspirin and 300 mg of Plavix via an orogastric tube prior to performing the right internal carotid artery angioplasty. Also the patient was given approximately 2000 units of IV heparin at the beginning of the procedure. Patient was then transported to the CT scanner for postprocedural CT scan of the brain. 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 1.5 mg of a selective intra-arterial Integrilin utilized within the segment of the right internal carotid artery. PLAN: Postprocedural CT scan of the brain prior to being transferred to the neuro ICU. Electronically Signed   By: Julieanne Cotton M.D.   On: 11/26/2016 21:18   Ir Angio Vertebral Sel Subclavian Innominate Uni R Mod Sed  Result Date: 11/27/2016 INDICATION: Left sided weaknee and severe dysarthria EXAM: 1. EMERGENT LARGE VESSEL OCCLUSION THROMBOLYSIS (anterior CIRCULATION) 2. 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 COMPARISON:  CT angiogram of 11/25/2016, and CT perfusion scan of 11/25/2016. MEDICATIONS: Ancef 2 g IV antibiotic was administered within 1 hour of the procedure. ANESTHESIA/SEDATION: General anesthesia. CONTRAST:  Isovue 300 approximately 120 CC. FLUOROSCOPY  TIME:  Fluoroscopy Time: 51 minutes 48 seconds (2968 mGy). COMPLICATIONS: None immediate. TECHNIQUE: Following a full explanation of the procedure along with the potential associated complications, an informed witnessed consent was obtained from the patient and the patient's wide. The risks of intracranial hemorrhage of 10%, worsening neurological deficit, ventilator dependency, death and inability to revascularize were all reviewed in detail with the patient'swife. The patient was then put under general anesthesia by the Department of Anesthesiology at [facility]. The right groin was prepped and draped in the usual sterile fashion. Thereafter using modified Seldinger technique, transfemoral access into the right common femoral artery was obtained without difficulty. Over a 0.035 inch guidewire a 5 French Pinnacle sheath was inserted. Through this, and also over a 0.035 inch guidewire a 5 [place] JB 1 catheter was advanced to the aortic arch region and selectively positioned in the right subclavian artery and the left common carotid artery, the left vertebral artery and the right common carotid artery. FINDINGS: The right subclavian arteriogram demonstrates the origin the right vertebral artery to be normal. The vessel is seen to opacify to the cranial skull base and opacifies the right vertebrobasilar junction and right posterior-inferior cerebellar artery. The opacified portion of the basilar artery and the right posterior cerebral artery appears grossly intact on the lateral images. The right common carotid arteriogram demonstrates extensive plaque extending from the distal right common carotid artery anterior wall advancing into the proximal right external carotid artery. The external carotid artery origin is mildly narrowed. Its branches are normally opacified. The right internal carotid artery just proximal to the bulb demonstrates a severe string-like pre occlusive stenosis. There is poststenotic  dilatation of the bulb itself. The delayed images demonstrate slow ascent of contrast to the cranial skull base in  the right internal carotid artery. The petrous, cavernous and supraclinoid segments demonstrate wide patency. Opacification of the right middle and right anterior cerebral artery is noted with poor visualization of the superior division of the right middle cerebral artery. Non-opacified blood is seen in the right middle cerebral artery proximally and the right anterior cerebral artery A1 segment probably from the posterior circulation via the posterior communicating artery. The left vertebral artery origin is from the aortic arch between the origins of the left common carotid artery and the left subclavian arteries. The vessel is seen to opacify to the cranial skull base. Wide patency is seen of the left vertebrobasilar junction and the left posterior-inferior cerebellar artery. The basilar artery, the superior cerebellar arteries and the anterior-inferior cerebellar arteries are seen to opacify into the capillary and venous phases. Opacification of the right posterior cerebellar P1 segment is noted. There is prompt opacification via the right posterior communicating artery of the right middle cerebral artery distribution right M1 segment and transiently the right anterior cerebral artery A1 segment. The delayed arterial phase again demonstrates a large area of hypoperfusion to absent perfusion involving the entire perisylvian triangle. There is poor attempt at retrograde opacification of the anterior 2/3 of the perisylvian branches from the pericallosal and callosal marginal branches. The inferior division appears to be widely patent with opacification to the cortical subcortical areas. The left common carotid arteriogram demonstrates the left external carotid artery and its branches to be widely patent. The left internal carotid artery at the bulb to the cranial skull base opacifies normally. The  petrous, the cavernous and the supraclinoid segments demonstrate wide patency as well. A left posterior communicating artery is seen opacifying the left posterior cerebral artery distribution and transiently the left superior cerebellar artery distribution. The left middle cerebral artery and the left anterior cerebral artery opacify normally into the capillary and venous phases. Transient cross opacification via the anterior communicating artery of the right anterior cerebral artery A2 segment and distally is noted. PROCEDURE: ENDOVASCULAR REVASCULARIZATION OF PRE OCCLUSIVE RIGHT INTERNAL CAROTID ARTERY AT THE BULB, WITH REVASCULARIZATION OF OCCLUDED SUPERIOR DIVISION OF THE RIGHT MIDDLE CEREBRAL ARTERY WITH MECHANICAL THROMBECTOMY USING 1 PASS WITH THE SOLITAIRE FR 4 MM X 40 MM RETRIEVAL DEVICE The diagnostic JB 1 catheter in the right common carotid artery was exchanged over a 0.035 inch 300 cm Rosen exchange guidewire for an 8 [place] 55 cm Brite tip neurovascular sheath using biplane roadmap technique and constant fluoroscopic guidance. Good aspiration was obtained from the side port of the neurovascular sheath. This was then connected to continuous heparinized saline infusion. Over the [employer] guidewire, an 8 [place] 85 cm FlowGate balloon guide catheter which had been prepped with 50% contrast and 50% heparinized saline infusion was advanced and positioned just proximal to the right common carotid bifurcation. The guidewire was removed. Good aspiration obtained from the hub of the 8 [place] FlowGate guide catheter. A gentle contrast injection demonstrated no evidence of spasms, dissections or intraluminal filling defects. A rapid transit 2 tip microcatheter was then advanced over a 0.0141 Softtip Synchro micro guidewire to the distal end of the [facility] guide catheter. Again using biplane roadmap technique and constant fluoroscopic guidance, in a coaxial manner and with constant heparinized saline  infusion, the combination of the micro guidewire and the microcatheter advanced into the origin of the right internal carotid artery. The micro guidewire with the J-tip configuration to avoid dissections or inducing spasm was then manipulated using a torque device passed  the string sign stenoses without difficulty and advanced in combination with the microcatheter to the horizontal petrous segment of the right internal carotid artery. The guidewire was removed. Good aspiration obtained from the hub of the rapid transit microcatheter. This was then exchanged out for a 300 cm 0.014 inch Softtip Transend exchange micro guidewire using biplane roadmap technique and constant fluoroscopic guidance. The exchange wire tip had a J configuration to avoid dissections or inducing spasm. A control arteriogram performed through the [facility] guide catheter in the right common carotid artery demonstrated no change in the high-grade stenosis with flow noted distally into the right internal carotid artery skull base. At this time, a 4 mm x 30 mm Viatrac 14 angioplasty balloon was prepped and purged retrogradely with heparinized saline infusion. Using the rapid exchange technique, this was then advanced and positioned such that the distal and the proximal markers were adequate distant from the site of the severe focal stenosis. A slow control inflation was then performed using a micro inflation syringe device via micro tubing to 10 atmospheres achieving a diameter of 4.08 mm where it was maintained for approximately 20 seconds. The balloon was then gently deflated and retrieved proximally and removed. A control arteriogram performed through the 8 [place] FlowGate guide catheter demonstrated significantly improved caliber and flow through the internal carotid artery and distally. A control arteriogram was then performed centered intracranially which confirmed the presence of occluded superior division of the right middle cerebral  artery. Over the exchange Transend EX micro guidewire, a Trevo ProVue 021 microcatheter was advanced and positioned in the proximal cavernous segment. The guidewire was removed. Good aspiration obtained from the hub of the microcatheter. Through this, a 0.014 inch Softip Synchro micro guidewire with a J tip configuration was advanced with the microcatheter to the supraclinoid right ICA. Using a torque device the micro guidewire was then gently advanced into the origin of the superior division. The micro micro guidewire was then gently advanced without difficulty into the M2 M3 region of the superior division followed by the microcatheter. At this time the [facility] guide catheter was advanced into the origin of the right internal carotid artery. Good aspiration obtained from the hub of the microcatheter. A gentle contrast injection demonstrated safe position of the tip of the microcatheter with slow antegrade clearance of contrast. A 4 mm x 40 mm Solitaire FR retrieval device was then advanced in a coaxial manner and with constant heparinized saline infusion using biplane roadmap technique and constant fluoroscopic guidance to the distal end of the microcatheter. The O ring on the delivery microcatheter was then gently loosened. There was slight forward gentle traction with the right hand on the delivery micro guidewire, with the left hand the delivery microcatheter was retrieved with the tip just proximal to the proximal portion of the opened retrieval device. A control arteriogram performed through the 8 [place] FlowGate guide catheter demonstrated only partial contrast advancement through the stented segment of the superior division. The balloon was then inflated in the origin of the right internal carotid artery for proximal flow arrest. The proximal portion of the retrieval device was then captured into the microcatheter. Thereafter using constant aspiration with a 60 mL syringe at the hub of the [facility] guide  catheter, the combination of the microcatheter and the retrieval device was then gently retrieved and removed. Chunks of clots were noted trapped within the interstices of the retrieval device and also in the hub of the [employer] [place]. Aspiration was continued as  the proximal flow arrest was released. Free back bleed of blood was noted at the hub of the 8 [place] FlowGate guide catheter. A control arteriogram performed through the 8 [place] FlowGate guide catheter demonstrated antegrade flow to the right internal carotid supraclinoid segments. Free flow was now noted through the superior division and inferior division of the right middle cerebral artery. The right anterior cerebral artery remained patent. A TICI 2b reperfusion had been established. There was no extravasation of contrast or of mass-effect on the major vessels intracranially. The rapid transit 2 tip microcatheter was then again advanced to the distal end of the 8 [place] FlowGate guide catheter just proximal to the origin of the right internal carotid artery with a 0.014 inch Softip Synchro micro guidewire. The micro guidewire was then gently manipulated through the now angioplastied segment of the right internal carotid artery followed by the microcatheter. The combination was advanced to the horizontal petrous segment. The guidewire was removed. Good aspiration obtained from the hub of the microcatheter. This in turn was then exchanged for a 300 cm Transend Softip EX exchange micro guidewire under constant fluoroscopic guidance. The exchange guidewire had a J-tip configuration in the petrous segment. A control arteriogram performed through the 8 [place] FlowGate guide catheter continued to demonstrate excellent flow through the right internal carotid artery intracranially and proximally performed of the right common carotid artery and right internal carotid artery in their normal segments. It was elected to use a 6 mm - 8 mm x 40 mm Xact stent system. The  stent carrying catheter was retrogradely flushed with heparinized saline infusion. Again using the rapid exchange technique, this was advanced under constant fluoroscopic guidance and positioned with the distal and the proximal markers of the stent optimally in relation to the angioplastied segment. This stent was then deployed under constant fluoroscopic guidance without any difficulty. The delivery catheter was then retrieved and removed with the exchange guidewire being maintained in the petrous segment. A control arteriogram performed through 8 [place] FlowGate guide catheter demonstrated excellent apposition proximally and distally with excellent flow noted through the stented segment. A 5 mm x 30 mm Viatrac 14 angioplasty balloon was then prepped and purged with heparinized saline infusion. Again using the rapid exchange technique this was advanced with the distal and proximal markers positioned optimally in relation to the waist on the stented segment. A slow control inflation was then performed of the angioplasty catheter to 10 atmospheres achieving a diameter of 5.14 mm. This was maintained for approximately 20 seconds. The balloon was then gently deflated and retrieved and removed. A control arteriogram performed through the 8 [place] FlowGate guide catheter demonstrated excellent flow and apposition. No evidence of dissection was noted distal to the angioplastied segment of the right internal carotid artery. There was now free flow noted into the right middle cerebral artery and right anterior cerebral artery. The superior division remained widely open with a small focal area of hypoperfusion involving the posterior perisylvian branches. However, delayed retrograde opacification via the inferior division collaterals were noted. Mild caliber irregularity within the stented segment was noted on the 10 minute control arteriogram. This prompted the use of 1.8 mg of selective intra-arterial Integrilin into this  stent. Control arteriograms were then performed at 20, 30 and 40 minutes post second angioplasty. These continued to demonstrate excellent flow through the stented segment of the right internal carotid artery and intracranially. The exchange micro guidewire was then gently retrieved and removed after the 20 minute control arteriogram.  The 8 [place] FlowGate guide catheter and the 8 [place] Brite tip neurovascular sheath were then retrieved into the abdominal aorta and exchanged over a J-tip guidewire for a 9 [place] Pinnacle sheath. This was then connected to continuous heparinized saline infusion. The right groin appeared soft without evidence of a hematoma or bleeding. The distal pulses in both feet remained 1+ after the procedure unchanged compared to prior to the procedure. The patient was also loaded with 650 mg of aspirin and 300 mg of Plavix via an orogastric tube prior to performing the right internal carotid artery angioplasty. Also the patient was given approximately 2000 units of IV heparin at the beginning of the procedure. Patient was then transported to the CT scanner for postprocedural CT scan of the brain. 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 1.5 mg of a selective intra-arterial Integrilin utilized within the segment of the right internal carotid artery. PLAN: Postprocedural CT scan of the brain prior to being transferred to the neuro ICU. Electronically Signed   By: Julieanne Cotton M.D.   On: 11/26/2016 21:18   Ir Angio Vertebral Sel Vertebral Uni L Mod Sed  Result Date: 11/27/2016 INDICATION: Left sided weaknee and severe dysarthria EXAM:  1. EMERGENT LARGE VESSEL OCCLUSION THROMBOLYSIS (anterior CIRCULATION) 2. 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 COMPARISON:  CT angiogram of 11/25/2016, and CT perfusion scan of 11/25/2016. MEDICATIONS: Ancef 2 g IV antibiotic was administered within 1 hour of the procedure. ANESTHESIA/SEDATION: General anesthesia. CONTRAST:  Isovue 300 approximately 120 CC. FLUOROSCOPY TIME:  Fluoroscopy Time: 51 minutes 48 seconds (2968 mGy). COMPLICATIONS: None immediate. TECHNIQUE: Following a full explanation of the procedure along with the potential associated complications, an informed witnessed consent was obtained from the patient and the patient's wide. The risks of intracranial hemorrhage of 10%, worsening neurological deficit, ventilator dependency, death and inability to revascularize were all reviewed in detail with the patient'swife. The patient was then put under general anesthesia by the Department of Anesthesiology at [facility]. The right groin was prepped and draped in the usual sterile fashion. Thereafter using modified Seldinger technique, transfemoral access into the right common femoral artery was obtained without difficulty. Over a 0.035 inch guidewire a 5 French Pinnacle sheath was inserted. Through this, and also over a 0.035 inch guidewire a 5 [place] JB 1 catheter was advanced to the aortic arch region and selectively positioned in the right subclavian artery and the left common carotid artery, the left vertebral artery and the right common carotid artery. FINDINGS: The right subclavian arteriogram demonstrates the origin the right vertebral artery to be normal. The vessel is seen to opacify to the cranial skull base and opacifies the right vertebrobasilar junction and right posterior-inferior cerebellar artery. The opacified portion of the basilar artery and the right posterior cerebral artery appears grossly intact on the  lateral images. The right common carotid arteriogram demonstrates extensive plaque extending from the distal right common carotid artery anterior wall advancing into the proximal right external carotid artery. The external carotid artery origin is mildly narrowed. Its branches are normally opacified. The right internal carotid artery just proximal to the bulb demonstrates a severe string-like pre  occlusive stenosis. There is poststenotic dilatation of the bulb itself. The delayed images demonstrate slow ascent of contrast to the cranial skull base in the right internal carotid artery. The petrous, cavernous and supraclinoid segments demonstrate wide patency. Opacification of the right middle and right anterior cerebral artery is noted with poor visualization of the superior division of the right middle cerebral artery. Non-opacified blood is seen in the right middle cerebral artery proximally and the right anterior cerebral artery A1 segment probably from the posterior circulation via the posterior communicating artery. The left vertebral artery origin is from the aortic arch between the origins of the left common carotid artery and the left subclavian arteries. The vessel is seen to opacify to the cranial skull base. Wide patency is seen of the left vertebrobasilar junction and the left posterior-inferior cerebellar artery. The basilar artery, the superior cerebellar arteries and the anterior-inferior cerebellar arteries are seen to opacify into the capillary and venous phases. Opacification of the right posterior cerebellar P1 segment is noted. There is prompt opacification via the right posterior communicating artery of the right middle cerebral artery distribution right M1 segment and transiently the right anterior cerebral artery A1 segment. The delayed arterial phase again demonstrates a large area of hypoperfusion to absent perfusion involving the entire perisylvian triangle. There is poor attempt at  retrograde opacification of the anterior 2/3 of the perisylvian branches from the pericallosal and callosal marginal branches. The inferior division appears to be widely patent with opacification to the cortical subcortical areas. The left common carotid arteriogram demonstrates the left external carotid artery and its branches to be widely patent. The left internal carotid artery at the bulb to the cranial skull base opacifies normally. The petrous, the cavernous and the supraclinoid segments demonstrate wide patency as well. A left posterior communicating artery is seen opacifying the left posterior cerebral artery distribution and transiently the left superior cerebellar artery distribution. The left middle cerebral artery and the left anterior cerebral artery opacify normally into the capillary and venous phases. Transient cross opacification via the anterior communicating artery of the right anterior cerebral artery A2 segment and distally is noted. PROCEDURE: ENDOVASCULAR REVASCULARIZATION OF PRE OCCLUSIVE RIGHT INTERNAL CAROTID ARTERY AT THE BULB, WITH REVASCULARIZATION OF OCCLUDED SUPERIOR DIVISION OF THE RIGHT MIDDLE CEREBRAL ARTERY WITH MECHANICAL THROMBECTOMY USING 1 PASS WITH THE SOLITAIRE FR 4 MM X 40 MM RETRIEVAL DEVICE The diagnostic JB 1 catheter in the right common carotid artery was exchanged over a 0.035 inch 300 cm Rosen exchange guidewire for an 8 French 55 cm Brite tip neurovascular sheath using biplane roadmap technique and constant fluoroscopic guidance. Good aspiration was obtained from the side port of the neurovascular sheath. This was then connected to continuous heparinized saline infusion. Over the [employer] guidewire, an 8 [place] 85 cm FlowGate balloon guide catheter which had been prepped with 50% contrast and 50% heparinized saline infusion was advanced and positioned just proximal to the right common carotid bifurcation. The guidewire was removed. Good aspiration obtained from  the hub of the 8 [place] FlowGate guide catheter. A gentle contrast injection demonstrated no evidence of spasms, dissections or intraluminal filling defects. A rapid transit 2 tip microcatheter was then advanced over a 0.0141 Softtip Synchro micro guidewire to the distal end of the [facility] guide catheter. Again using biplane roadmap technique and constant fluoroscopic guidance, in a coaxial manner and with constant heparinized saline infusion, the combination of the micro guidewire and the microcatheter advanced into the origin of the right  internal carotid artery. The micro guidewire with the J-tip configuration to avoid dissections or inducing spasm was then manipulated using a torque device passed the string sign stenoses without difficulty and advanced in combination with the microcatheter to the horizontal petrous segment of the right internal carotid artery. The guidewire was removed. Good aspiration obtained from the hub of the rapid transit microcatheter. This was then exchanged out for a 300 cm 0.014 inch Softtip Transend exchange micro guidewire using biplane roadmap technique and constant fluoroscopic guidance. The exchange wire tip had a J configuration to avoid dissections or inducing spasm. A control arteriogram performed through the [employer] guide catheter in the right common carotid artery demonstrated no change in the high-grade stenosis with flow noted distally into the right internal carotid artery skull base. At this time, a 4 mm x 30 mm Viatrac 14 angioplasty balloon was prepped and purged retrogradely with heparinized saline infusion. Using the rapid exchange technique, this was then advanced and positioned such that the distal and the proximal markers were adequate distant from the site of the severe focal stenosis. A slow control inflation was then performed using a micro inflation syringe device via micro tubing to 10 atmospheres achieving a diameter of 4.08 mm where it was maintained for  approximately 20 seconds. The balloon was then gently deflated and retrieved proximally and removed. A control arteriogram performed through the 8 [place] FlowGate guide catheter demonstrated significantly improved caliber and flow through the internal carotid artery and distally. 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 Over the exchange Transend EX micro guidewire, a Trevo ProVue 021 microcatheter was advanced and positioned in the proximal cavernous segment. The guidewire was removed. Good aspiration obtained from the hub of the microcatheter. Through this, a 0.014 inch Softip Synchro micro guidewire with a J tip configuration was advanced with the microcatheter to the supraclinoid right ICA. Using a torque device the micro guidewire was then gently advanced into the origin of the superior division. The micro micro guidewire was then gently advanced without difficulty into the M2 M3 region of the superior division followed by the microcatheter. At this time the [facility] guide catheter was advanced into the origin of the right internal carotid artery. Good aspiration obtained from the hub of the microcatheter. A gentle contrast injection demonstrated safe position of the tip of the microcatheter with slow antegrade clearance of contrast. A 4 mm x 40 mm Solitaire FR retrieval device was then advanced in a coaxial manner and with constant heparinized saline infusion using biplane roadmap technique and constant fluoroscopic guidance to the distal end of the microcatheter. The O ring on the delivery microcatheter was then gently loosened. There was slight forward gentle traction with the right hand on the delivery micro guidewire, with the left hand the delivery microcatheter was retrieved with the tip just proximal to the proximal portion of the opened retrieval device. A control arteriogram  performed through the 8 [place] FlowGate guide catheter demonstrated only partial contrast advancement through the stented segment of the superior division. The balloon was then inflated in the origin of the right internal carotid artery for proximal flow arrest. The proximal portion of the retrieval device was then captured into the microcatheter. Thereafter using constant aspiration with a 60 mL syringe at the hub of the [facility] guide catheter, the combination of the microcatheter and the retrieval device was then gently retrieved and removed. Chunks of  clots were noted trapped within the interstices of the retrieval device and also in the hub of the [employer]. Aspiration was continued as the proximal flow arrest was released. Free back bleed of blood was noted at the hub of the 8 [place] FlowGate guide catheter. A control arteriogram performed through the 8 [place] FlowGate guide catheter demonstrated antegrade flow to the right internal carotid supraclinoid segments. Free flow was now noted through the superior division and inferior division of the right middle cerebral artery. The right anterior cerebral artery remained patent. A TICI 2b reperfusion had been established. There was no extravasation of contrast or of mass-effect on the major vessels intracranially. The rapid transit 2 tip microcatheter was then again advanced to the distal end of the 8 [place] FlowGate guide catheter just proximal to the origin of the right internal carotid artery with a 0.014 inch Softip Synchro micro guidewire. The micro guidewire was then gently manipulated through the now angioplastied segment of the right internal carotid artery followed by the microcatheter. The combination was advanced to the horizontal petrous segment. The guidewire was removed. Good aspiration obtained from the hub of the microcatheter. This in turn was then exchanged for a 300 cm Transend Softip EX exchange micro guidewire under constant fluoroscopic  guidance. The exchange guidewire had a J-tip configuration in the petrous segment. A control arteriogram performed through the 8 [place] FlowGate guide catheter continued to demonstrate excellent flow through the right internal carotid artery intracranially and proximally performed of the right common carotid artery and right internal carotid artery in their normal segments. It was elected to use a 6 mm - 8 mm x 40 mm Xact stent system. The stent carrying catheter was retrogradely flushed with heparinized saline infusion. Again using the rapid exchange technique, this was advanced under constant fluoroscopic guidance and positioned with the distal and the proximal markers of the stent optimally in relation to the angioplastied segment. This stent was then deployed under constant fluoroscopic guidance without any difficulty. The delivery catheter was then retrieved and removed with the exchange guidewire being maintained in the petrous segment. A control arteriogram performed through 8 [place] FlowGate guide catheter demonstrated excellent apposition proximally and distally with excellent flow noted through the stented segment. A 5 mm x 30 mm Viatrac 14 angioplasty balloon was then prepped and purged with heparinized saline infusion. Again using the rapid exchange technique this was advanced with the distal and proximal markers positioned optimally in relation to the waist on the stented segment. A slow control inflation was then performed of the angioplasty catheter to 10 atmospheres achieving a diameter of 5.14 mm. This was maintained for approximately 20 seconds. The balloon was then gently deflated and retrieved and removed. A control arteriogram performed through the 8 [place] FlowGate guide catheter demonstrated excellent flow and apposition. No evidence of dissection was noted distal to the angioplastied segment of the right internal carotid artery. There was now free flow noted into the right middle cerebral  artery and right anterior cerebral artery. The superior division remained widely open with a small focal area of hypoperfusion involving the posterior perisylvian branches. However, delayed retrograde opacification via the inferior division collaterals were noted. Mild caliber irregularity within the stented segment was noted on the 10 minute control arteriogram. This prompted the use of 1.8 mg of selective intra-arterial Integrilin into this stent. Control arteriograms were then performed at 20, 30 and 40 minutes post second angioplasty. These continued to demonstrate excellent flow through the stented segment  of the right internal carotid artery and intracranially. The exchange micro guidewire was then gently retrieved and removed after the 20 minute control arteriogram. The 8 [place] FlowGate guide catheter and the 8 [place] Brite tip neurovascular sheath were then retrieved into the abdominal aorta and exchanged over a J-tip guidewire for a 9 [place] Pinnacle sheath. This was then connected to continuous heparinized saline infusion. The right groin appeared soft without evidence of a hematoma or bleeding. The distal pulses in both feet remained 1+ after the procedure unchanged compared to prior to the procedure. The patient was also loaded with 650 mg of aspirin and 300 mg of Plavix via an orogastric tube prior to performing the right internal carotid artery angioplasty. Also the patient was given approximately 2000 units of IV heparin at the beginning of the procedure. Patient was then transported to the CT scanner for postprocedural CT scan of the brain. 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 Post endovascular revascularization of symptomatic pre occlusive string-like stenoses of the right internal carotid artery proximal to the  bulb with stent assisted angioplasty. 1.5 mg of a selective intra-arterial Integrilin utilized within the segment of the right internal carotid artery. PLAN: Postprocedural CT scan of the brain prior to being transferred to the neuro ICU. Electronically Signed   By: Julieanne Cotton M.D.   On: 11/26/2016 21:18    Labs:  CBC:  Recent Labs  11/25/16 1347 11/26/16 0420 11/27/16 0425 11/28/16 0216  WBC 3.1* 4.5 4.9 4.2  HGB 15.4 13.9 13.2 12.6*  HCT 46.0 42.0 40.9 38.3*  PLT 188 185 166 154    COAGS:  Recent Labs  11/25/16 1347  INR 1.04  APTT 33    BMP:  Recent Labs  11/25/16 1347 11/26/16 0420 11/27/16 0425 11/28/16 0216  NA 136 139 140 138  K 4.1 3.9 3.9 3.6  CL 104 110 108 107  CO2 24 23 24 25   GLUCOSE 98 109* 112* 106*  BUN 14 8 6 8   CALCIUM 9.3 7.9* 7.2* 8.0*  CREATININE 1.01 0.99 1.05 1.05  GFRNONAA >60 >60 >60 >60  GFRAA >60 >60 >60 >60    LIVER FUNCTION TESTS:  Recent Labs  01/26/16 1405 11/25/16 1347  BILITOT 0.8 0.8  AST 18 25  ALT 20 23  ALKPHOS 52 52  PROT 7.2 7.6  ALBUMIN 4.2 4.4    Assessment and Plan: Right MCA CVA s/p revascularization of superior division of R MCA with x1 pass with 4mm x 40mm solitaire FR retreival device achieving a TICI 2b reperfusion. Revascularization of symptomatic near complete occlusion of right ICA proximally with stent assisted angioplasty. P2Y12 improved to 52 after transition to Brilinta.  Plans for admission to CIR.  Cortrak tube in place, although has been able to eat and drink with some tolerance.  Plans per Neurology.  IR to follow.   Electronically Signed: Hoyt Koch, PA 11/29/2016, 11:31 AM   I spent a total of 15 Minutes at the the patient's bedside AND on the patient's hospital floor or unit, greater than 50% of which was counseling/coordinating care for CVA

## 2016-11-29 NOTE — Progress Notes (Signed)
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 He is now status post right ICA angioplasty and stenting with thrombectomy of the right superior division middle cerebral artery.    PT Comments    Pt seen for mobility progression. He continues to demonstrate significant expressive difficulties; however, very motivated and eager to participate, able to follow commands very well. Pt continues to require physical assistance of two for transfers. Pt remains an excellent candidate for further intensive therapy services at [facility]. PT will continue to follow acutely.     Follow Up Recommendations  CIR;Supervision/Assistance - 24 hour     Equipment Recommendations  Wheelchair (measurements PT);Wheelchair cushion (measurements PT);Rolling walker with 5" wheels;3in1 (PT)    Recommendations for Other Services Rehab consult     Precautions / Restrictions Precautions Precautions: Fall Precaution Comments: Permissive HTN <220/120  Restrictions Weight Bearing Restrictions: No    Mobility  Bed Mobility Overal bed mobility: Needs Assistance Bed Mobility: Supine to Sit     Supine to sit: Min assist     General bed mobility comments: increased time, min A with L LE movement, transferring towards his R side, use of R UE on bed rail  Transfers Overall transfer level: Needs assistance Equipment used: 2 person hand held assist Transfers: Sit to/from [name];Stand Pivot Transfers Sit to Stand: Mod assist;+2 physical assistance Stand pivot transfers: Mod assist;+2 physical assistance       General transfer comment: +2 assist to power-up to full standing  position, LUE supported throughout transfer. Once standing, pt able to improve posture with cues but was unable to maintain. Pt took a few pivotal steps around to the recliner with support at gait belt and physical assist with L LE movement  Ambulation/Gait                 Stairs            Wheelchair Mobility    Modified Rankin (Stroke Patients Only) Modified Rankin (Stroke Patients Only) Pre-Morbid Rankin Score: No symptoms Modified Rankin: Moderately severe disability     Balance Overall balance assessment: Needs assistance Sitting-balance support: Feet supported;No upper extremity supported Sitting balance-Leahy Scale: Fair Sitting balance - Comments: pt able to sit [name] with supervision   Standing balance support: Bilateral upper extremity supported;During functional activity Standing balance-Leahy Scale: Poor Standing balance comment: mod A x2                            Cognition Arousal/Alertness: Awake/alert Behavior During Therapy: WFL for tasks assessed/performed Overall Cognitive Status: Within Functional Limits for tasks assessed                                 General Comments: Significant expressive aphasia - pt unable to answer questions verbally however was answering therapist with head and hand gestures.       Exercises      General Comments        Pertinent Vitals/Pain Pain Assessment: No/denies pain    Home Living  Prior Function            PT Goals (current goals can now be found in the care plan section) Acute Rehab PT Goals PT Goal Formulation: Patient unable to participate in goal setting Time For Goal Achievement: 12/12/16 Potential to Achieve Goals: Good Progress towards PT goals: Progressing toward goals    Frequency    Min 4X/week      PT Plan Current plan remains appropriate    Co-evaluation              AM-PAC PT "6 Clicks" Daily Activity   Outcome Measure  Difficulty turning over in bed (including adjusting bedclothes, sheets and blankets)?: A Little Difficulty moving from lying on back to sitting on the side of the bed? : Total Difficulty sitting down on and standing up from a chair with arms (e.g., wheelchair, bedside commode, etc,.)?: Total Help needed moving to and from a bed to chair (including a wheelchair)?: A Lot Help needed walking in hospital room?: A Lot Help needed climbing 3-5 steps with a railing? : Total 6 Click Score: 10    End of Session Equipment Utilized During Treatment: Gait belt Activity Tolerance: Patient tolerated treatment well Patient left: in chair;with call bell/phone within reach;with chair alarm set;with family/visitor present Nurse Communication: Mobility status PT Visit Diagnosis: Unsteadiness on feet (R26.81);Hemiplegia and hemiparesis Hemiplegia - Right/Left: Left Hemiplegia - dominant/non-dominant: Dominant Hemiplegia - caused by: Cerebral infarction     Time: [id number] PT Time Calculation (min) (ACUTE ONLY): 19 min  Charges:  $Therapeutic Activity: 8-22 mins                    G Codes:       [place]villeJennifer Nickoles Gregori, [place] CarolinaPT, [place]PT [phone]    Alessandra BevelsJennifer M Arfa Lamarca 11/29/2016, 12:06 PM

## 2016-11-30 ENCOUNTER — Inpatient Hospital Stay (HOSPITAL_COMMUNITY): Payer: Medicare Other | Admitting: Physical Therapy

## 2016-11-30 ENCOUNTER — Inpatient Hospital Stay (HOSPITAL_COMMUNITY): Payer: Medicare Other | Admitting: Speech Pathology

## 2016-11-30 ENCOUNTER — Inpatient Hospital Stay (HOSPITAL_COMMUNITY): Payer: Medicare Other | Admitting: Occupational Therapy

## 2016-11-30 DIAGNOSIS — E46 Unspecified protein-calorie malnutrition: Secondary | ICD-10-CM

## 2016-11-30 DIAGNOSIS — D62 Acute posthemorrhagic anemia: Secondary | ICD-10-CM | POA: Diagnosis present

## 2016-11-30 DIAGNOSIS — I10 Essential (primary) hypertension: Secondary | ICD-10-CM | POA: Insufficient documentation

## 2016-11-30 DIAGNOSIS — I69322 Dysarthria following cerebral infarction: Secondary | ICD-10-CM

## 2016-11-30 LAB — CBC WITH DIFFERENTIAL/PLATELET
Basophils Relative: 1 %
EOS PCT: 6 %
HCT: 36.4 % — ABNORMAL LOW (ref 39.0–52.0)
HEMOGLOBIN: 12.2 g/dL — AB (ref 13.0–17.0)
Lymphocytes Relative: 30 %
MCH: 30.3 pg (ref 26.0–34.0)
MCHC: 33.5 g/dL (ref 30.0–36.0)
MCV: 90.3 fL (ref 78.0–100.0)
MONOS PCT: 11 %
NEUTROS PCT: 52 %
PLATELETS: 161 10*3/uL (ref 150–400)
RBC: 4.03 MIL/uL — ABNORMAL LOW (ref 4.22–5.81)
RDW: 13.2 % (ref 11.5–15.5)
WBC: 3.8 10*3/uL — AB (ref 4.0–10.5)

## 2016-11-30 LAB — COMPREHENSIVE METABOLIC PANEL
ALK PHOS: 37 U/L — AB (ref 38–126)
ALT: 18 U/L (ref 17–63)
ANION GAP: 8 (ref 5–15)
AST: 26 U/L (ref 15–41)
Albumin: 3 g/dL — ABNORMAL LOW (ref 3.5–5.0)
BUN: 10 mg/dL (ref 6–20)
CALCIUM: 8.3 mg/dL — AB (ref 8.9–10.3)
CO2: 23 mmol/L (ref 22–32)
CREATININE: 1.01 mg/dL (ref 0.61–1.24)
Chloride: 107 mmol/L (ref 101–111)
Glucose, Bld: 94 mg/dL (ref 65–99)
Potassium: 3.6 mmol/L (ref 3.5–5.1)
SODIUM: 138 mmol/L (ref 135–145)
Total Bilirubin: 1.1 mg/dL (ref 0.3–1.2)
Total Protein: 5.8 g/dL — ABNORMAL LOW (ref 6.5–8.1)

## 2016-11-30 MED ORDER — PRO-STAT SUGAR FREE PO LIQD
30.0000 mL | Freq: Two times a day (BID) | ORAL | Status: DC
  Administered 2016-11-30 – 2016-12-21 (×43): 30 mL via ORAL
  Filled 2016-11-30 (×43): qty 30

## 2016-11-30 NOTE — Evaluation (Signed)
Occupational Therapy Assessment and Plan  Patient Details  Name: Jason Nolan MRN: [id number] Date of Birth: 1946-02-18  OT Diagnosis: hemiplegia affecting dominant side, muscle weakness (generalized) and impaired sensation, coordination disorder Rehab Potential: Rehab Potential (ACUTE ONLY): Good ELOS: 18-21 days   Today's Date: 11/30/2016 OT Individual Time: 9798-9211 OT Individual Time Calculation (min): 56 min     Problem List:  Patient Active Problem List   Diagnosis Date Noted  . Acute right MCA stroke ([place]) 11/29/2016  . Dysphagia, oropharyngeal 11/29/2016  . Essential hypertension 11/29/2016  . Cerebral embolism with cerebral infarction 11/25/2016  . Acute ischemic stroke ([place]) - R MCA embolic stroke in setting of ruptured R ICA plaque w dissection, s/p R MCA stent and thrombectomy 11/25/2016  . Leukopenia 03/31/2016  . Hepatitis C 07/22/2013    Past Medical History:  Past Medical History:  Diagnosis Date  . Glaucoma   . Hepatitis C    Past Surgical History:  Past Surgical History:  Procedure Laterality Date  . IR ANGIO INTRA EXTRACRAN SEL COM CAROTID INNOMINATE UNI L MOD SED  11/25/2016  . IR ANGIO VERTEBRAL SEL SUBCLAVIAN INNOMINATE UNI R MOD SED  11/25/2016  . IR ANGIO VERTEBRAL SEL VERTEBRAL UNI L MOD SED  11/25/2016  . IR INTRAVSC STENT CERV CAROTID W/O EMB-PROT MOD SED INC ANGIO  11/25/2016  . IR PERCUTANEOUS ART THROMBECTOMY/INFUSION INTRACRANIAL INC DIAG ANGIO  11/25/2016  . RADIOLOGY WITH ANESTHESIA N/A 11/25/2016   Procedure: RADIOLOGY WITH ANESTHESIA;  Surgeon: Radiologist, Medication, MD;  Location: [place];  Service: Radiology;  Laterality: N/A;    Assessment & Plan Clinical Impression: Patient is a 71 y.o. year old male with history of Hep C-treated with Harvoni, glaucoma who was admitted via [facility] on 7/23 with left sided weakness, facial droop and slurred speech due to early changes of R-MCA territory infarct. He underwent CTA brain showed short segment  near occlusion of proximal R-ICA with associated intraluminal thrombus --likely due to dissection and emergent large vessel occlusion with right M2 occlusion and evolving right frontal lobe infarct. He underwent cerebral angio with complete revascularization of occluded superior division of R-MCA and near complete revascularization of proximal R-ICA with stent assisted angioplasty. MRI brain done revealing right insula and frontal parietal junction acute infarct, several punctate foci of acute infarct in right frontal lobe, right parietal lobe, right occipital lobe, and left superior cerebellar hemisphere and stable right sylvian/posterior frontal convexity extra axial signal from extravasated contrast and/or SAH.  He tolerated extubation on 7/25. 2 D echo with EF 60-65% with no wall abnormality. Carotid dopplers showed 40-50% right proximal ICA stenosis, L- 1-39% stenosis and elevated velocities in B-VA. Stroke felt to be embolic in setting of ruptured plaque R-ICA and patient on ASA and brillinta due to stent. MBS done and patient started on dysphagia 3, thin liquids. Patient with resultant left sided weakness, expressive deficits with aphonia and dysphagia. Therapy evaluations done 7/26and CIR recommended due to deficits in mobility and ability to carry out ADL tasks .  Patient transferred to CIR on 11/29/2016 .    Patient currently requires max with basic self-care skills secondary to muscle weakness, decreased cardiorespiratoy endurance, decreased coordination and decreased motor planning, balance and decreased sitting balance, decreased standing balance, hemiplegia and decreased balance strategies.  Prior to hospitalization, patient could complete ADLs and IADLs with independent .  Patient will benefit from skilled intervention to increase independence with basic self-care skills prior to discharge home with care partner.  Anticipate patient  will require 24 hour supervision and follow up  outpatient.  OT - End of Session Activity Tolerance: Decreased this session Endurance Deficit: Yes Endurance Deficit Description: multiple rest breaks secondary to fatigue OT Assessment Rehab Potential (ACUTE ONLY): Good OT Barriers to Discharge: Other (comments) OT Barriers to Discharge Comments: none known at this time OT Patient demonstrates impairments in the following area(s): Balance;Behavior;Endurance;Motor;Safety;Perception OT Basic ADL's Functional Problem(s): Grooming;Eating;Bathing;Dressing;Toileting OT Transfers Functional Problem(s): Toilet;Tub/Shower OT Additional Impairment(s): Fuctional Use of Upper Extremity OT Plan OT Intensity: Minimum of 1-2 x/day, 45 to 90 minutes OT Frequency: 5 out of 7 days OT Duration/Estimated Length of Stay: 18-21 days OT Treatment/Interventions: Balance/vestibular training;Cognitive remediation/compensation;Community reintegration;Discharge planning;Functional mobility training;Psychosocial support;Therapeutic Activities;UE/LE Coordination activities;Patient/family education;DME/adaptive equipment instruction;Skin care/wound managment;UE/LE Strength taining/ROM;Wheelchair propulsion/positioning;Therapeutic Exercise;Self Care/advanced ADL retraining;Neuromuscular re-education OT Self Feeding Anticipated Outcome(s): supervision OT Basic Self-Care Anticipated Outcome(s): supervision OT Toileting Anticipated Outcome(s): supervision OT Bathroom Transfers Anticipated Outcome(s): supervision OT Recommendation Recommendations for Other Services: Therapeutic Recreation consult Therapeutic Recreation Interventions: Pet therapy Patient destination: Home Follow Up Recommendations: Outpatient OT Equipment Recommended: To be determined   Skilled Therapeutic Intervention Upon entering the room, NT present and changing pt and bed sheets from incontinent accident. Pt performed supine >sit with min A to [name]. Pt seated on [name] for bathing task this session. Pt  required hand over hand assistance with use of L UE for functional task. Pt able to utilize paper hand pen to answer open ended questions accurately and "yes" and "no" gestures when appropriate. Pt standing from bed with mod lifting assistance for therapist to assist with donning of brief with mod A for balance. Pt taking multiple steps to recliner chair with max A before transferring into chair. Breakfast placed in front of him with set up A for meal. Call bell and all needs within reach upon exiting the room.   OT Evaluation Precautions/Restrictions  Precautions Precautions: Fall Restrictions Weight Bearing Restrictions: No Pain Pain Assessment Pain Assessment: No/denies pain Home Living/Prior Functioning Home Living Family/patient expects to be discharged to:: Private residence Living Arrangements: Spouse/significant other Available Help at Discharge: Family, Available 24 hours/day Type of Home: House Home Access: Level entry Home Layout: One level Bathroom Shower/Tub: Gaffer, Charity fundraiser: Associate Professor Accessibility: Yes  Lives With: Spouse IADL History Current License: Yes Mode of Transportation: Car IADL Comments: likes golf Prior Function Level of Independence: Independent with basic ADLs, Independent with gait  Able to Take Stairs?: Yes Driving: Yes Vocation: Retired Comments: Pt is retired. States he exercises Vision Baseline Vision/History: Wears glasses Wears Glasses: Reading only Patient Visual Report: No change from baseline Cognition Overall Cognitive Status: Within Functional Limits for tasks assessed Arousal/Alertness: Awake/alert Orientation Level: Person;Place;Situation (pt writing information for therapist) Person: Oriented Place: Oriented Situation: Oriented Year: 2018 Month: July Day of Week: Correct Immediate Memory Recall: Sock;Blue;Bed Memory Recall: Sock;Blue;Bed Memory Recall Sock: Without Cue Memory Recall Blue: Without  Cue Memory Recall Bed: Without Cue Sensation Sensation Light Touch: Impaired Detail Light Touch Impaired Details: Impaired LUE;Impaired LLE Proprioception: Impaired by gross assessment Additional Comments: pt reports parathesia on the LLE and LUE with light tough testing. able to detect 75% of stimuli  Coordination Gross Motor Movements are Fluid and Coordinated: No Fine Motor Movements are Fluid and Coordinated: No Finger Nose Finger Test: unable to complete on LUE Motor  Motor Motor: Hemiplegia Motor - Skilled Clinical Observations: L hemiplegia UE>LE Mobility  Bed Mobility Bed Mobility: Rolling Right;Rolling Left;Supine to Sit;Sit to Supine Rolling Right: 4: Min  assist Rolling Right Details: Verbal cues for precautions/safety;Verbal cues for safe use of DME/AE;Manual facilitation for placement;Verbal cues for sequencing;Verbal cues for technique Rolling Left: 4: Min assist Rolling Left Details: Verbal cues for technique;Verbal cues for precautions/safety;Verbal cues for safe use of DME/AE;Manual facilitation for placement;Verbal cues for sequencing Supine to Sit: 4: Min assist Supine to Sit Details: Verbal cues for technique;Verbal cues for precautions/safety;Verbal cues for safe use of DME/AE;Verbal cues for sequencing;Manual facilitation for placement Sit to Supine: 4: Min assist Sit to Supine - Details: Verbal cues for precautions/safety;Verbal cues for technique;Verbal cues for sequencing;Verbal cues for safe use of DME/AE;Manual facilitation for placement Transfers Sit to Stand: 3: Mod assist Sit to Stand Details: Verbal cues for technique;Verbal cues for precautions/safety;Verbal cues for sequencing;Verbal cues for safe use of DME/AE  Trunk/Postural Assessment  Cervical Assessment Cervical Assessment: Within Functional Limits Thoracic Assessment Thoracic Assessment: Within Functional Limits Lumbar Assessment Lumbar Assessment: Within Functional Limits Postural  Control Postural Control: Deficits on evaluation Protective Responses: delayed   Balance Balance Balance Assessed: Yes Static Sitting Balance Static Sitting - Balance Support: No upper extremity supported Static Sitting - Level of Assistance: 5: Stand by assistance Dynamic Sitting Balance Dynamic Sitting - Balance Support: No upper extremity supported Dynamic Sitting - Level of Assistance: 4: Min assist;5: Stand by assistance Static Standing Balance Static Standing - Level of Assistance: 2: Max assist;3: Mod assist Dynamic Standing Balance Dynamic Standing - Balance Support: No upper extremity supported;During functional activity Dynamic Standing - Level of Assistance: 2: Max assist Extremity/Trunk Assessment RUE Assessment RUE Assessment: Within Functional Limits LUE Assessment LUE Assessment: Exceptions to [facility] (2-/5 shoulder, 1/5 elbow, and 0/5 wrist and digits)   See Function Navigator for Current Functional Status.   Refer to Care Plan for Long Term Goals  Recommendations for other services: Therapeutic Recreation  Pet therapy   Discharge Criteria: Patient will be discharged from OT if patient refuses treatment 3 consecutive times without medical reason, if treatment goals not met, if there is a change in medical status, if patient makes no progress towards goals or if patient is discharged from hospital.  The above assessment, treatment plan, treatment alternatives and goals were discussed and mutually agreed upon: by patient  Gypsy Decant 11/30/2016, 8:01 AM

## 2016-11-30 NOTE — Progress Notes (Signed)
[facility]     PROGRESS NOTE  Subjective/Complaints:  Pt seen laying in bed this AM.  He slept well overnight and states he is ready to begin therapies.    ROS: Denies CP, SOB, N/V/D.  Objective: Vital Signs: Blood pressure 140/67, pulse 60, temperature 98.8 F (37.1 C), temperature source Oral, resp. rate 18, height 5\' 10"  (1.778 m), weight 92.3 kg (203 lb 7.8 oz), SpO2 96 %. Dg Swallowing Func-speech Pathology  Result Date: 11/28/2016 Objective Swallowing Evaluation: Type of Study: MBS-Modified Barium Swallow Study Patient Details Name: Jeramy Dimmick MRN: [id number] Date of Birth: 05-23-1945 Today's Date: 11/28/2016 Time: SLP Start Time (ACUTE ONLY): 1120-SLP Stop Time (ACUTE ONLY): 1240 SLP Time Calculation (min) (ACUTE ONLY): 80 min Past Medical History: Past Medical History: Diagnosis Date . Glaucoma  . Hepatitis C  Past Surgical History: Past Surgical History: Procedure Laterality Date . IR ANGIO INTRA EXTRACRAN SEL COM CAROTID INNOMINATE UNI L MOD SED  11/25/2016 . IR ANGIO VERTEBRAL SEL SUBCLAVIAN INNOMINATE UNI R MOD SED  11/25/2016 . IR ANGIO VERTEBRAL SEL VERTEBRAL UNI L MOD SED  11/25/2016 . IR INTRAVSC STENT CERV CAROTID W/O EMB-PROT MOD SED INC ANGIO  11/25/2016 . IR PERCUTANEOUS ART THROMBECTOMY/INFUSION INTRACRANIAL INC DIAG ANGIO  11/25/2016 . RADIOLOGY WITH ANESTHESIA N/A 11/25/2016  Procedure: RADIOLOGY WITH ANESTHESIA;  Surgeon: Radiologist, Medication, MD;  Location: [facility] OR;  Service: Radiology;  Laterality: N/A; HPI: 71 year old male with PMH as below, which is significant for Hepatitic C s/p treatment with Harvoni. He was last known normal at 2am on 7/23 when he went to sleep. He woke up around 9am with left sided weakness, facial droop, and slurred speech. He presented to [facility] where he was found to have a R MCA CVA, but was outside window for tpa. He was transferred to [facility] for IR evaluation. He underwent successful revascularization of R MCA and R  ICA. Post-procedurally he remained on ventilator and was transferred to ICU. Concern for potential hemorrhage post IR proedure, but may have been extravasation of dye and not a bleed. Intubated 7/23 to 7/25 in am. 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 Pt is left handed.  No Data Recorded Assessment / Plan / Recommendation CHL IP CLINICAL IMPRESSIONS 11/28/2016 Clinical Impression Despite significant left lingual weakness and left labial/facial sensory loss, pt is able to tolerate thin liquids and masticate regular solids without oral residuals. There is mild anterior spillage, but no retained barium in the buccal cavity. Oropharyngeal phase WNL. Primary concern is pt biting his tongue during mastication, possibly without awareness. Recommend pt attempt a dys 3 mechanical soft diet with thin liquids, but will downgrade texture as needed if he cannot manage his tongue. May also need to attempt compensatory strategies for anterior loss. Will follow for tolerance.  SLP Visit Diagnosis Dysphagia, oral phase (R13.11) Attention and concentration deficit following -- Frontal lobe and executive function deficit following -- Impact on safety and function Mild aspiration risk   CHL IP TREATMENT RECOMMENDATION 11/28/2016 Treatment Recommendations Therapy as outlined in treatment plan below   Prognosis 11/28/2016 Prognosis for Safe Diet Advancement Good Barriers to Reach Goals -- Barriers/Prognosis Comment -- CHL IP DIET RECOMMENDATION 11/28/2016 SLP Diet Recommendations Dysphagia 3 (Mech soft) solids;Thin liquid Liquid Administration via Cup;Straw Medication Administration Whole meds with liquid Compensations Other (Comment) Postural Changes Remain semi-upright after after feeds/meals (Comment);Seated upright at 90 degrees   CHL IP OTHER RECOMMENDATIONS 11/28/2016 Recommended Consults -- Oral Care  Recommendations Oral care BID Other Recommendations --   CHL IP FOLLOW UP RECOMMENDATIONS 11/28/2016  Follow up Recommendations Inpatient Rehab   CHL IP FREQUENCY AND DURATION 11/28/2016 Speech Therapy Frequency (ACUTE ONLY) min 2x/week Treatment Duration 2 weeks      CHL IP ORAL PHASE 11/28/2016 Oral Phase Impaired Oral - Pudding Teaspoon -- Oral - Pudding Cup -- Oral - Honey Teaspoon -- Oral - Honey Cup -- Oral - Nectar Teaspoon -- Oral - Nectar Cup -- Oral - Nectar Straw -- Oral - Thin Teaspoon -- Oral - Thin Cup Left anterior bolus loss Oral - Thin Straw Left anterior bolus loss Oral - Puree WFL Oral - Mech Soft -- Oral - Regular WFL Oral - Multi-Consistency -- Oral - Pill WFL Oral Phase - Comment --  CHL IP PHARYNGEAL PHASE 11/28/2016 Pharyngeal Phase WFL Pharyngeal- Pudding Teaspoon -- Pharyngeal -- Pharyngeal- Pudding Cup -- Pharyngeal -- Pharyngeal- Honey Teaspoon -- Pharyngeal -- Pharyngeal- Honey Cup -- Pharyngeal -- Pharyngeal- Nectar Teaspoon -- Pharyngeal -- Pharyngeal- Nectar Cup -- Pharyngeal -- Pharyngeal- Nectar Straw -- Pharyngeal -- Pharyngeal- Thin Teaspoon -- Pharyngeal -- Pharyngeal- Thin Cup -- Pharyngeal -- Pharyngeal- Thin Straw -- Pharyngeal -- Pharyngeal- Puree -- Pharyngeal -- Pharyngeal- Mechanical Soft -- Pharyngeal -- Pharyngeal- Regular -- Pharyngeal -- Pharyngeal- Multi-consistency -- Pharyngeal -- Pharyngeal- Pill -- Pharyngeal -- Pharyngeal Comment --  No flowsheet data found. No flowsheet data found. Harlon DittyBonnie DeBlois, MA CCC-SLP [phone] [name], Bonnie [name] 11/28/2016, 12:20 PM               Recent Labs  11/28/16 0216 11/30/16 0525  WBC 4.2 3.8*  HGB 12.6* 12.2*  HCT 38.3* 36.4*  PLT 154 161    Recent Labs  11/28/16 0216 11/30/16 0525  NA 138 138  K 3.6 3.6  CL 107 107  GLUCOSE 106* 94  BUN 8 10  CREATININE 1.05 1.01  CALCIUM 8.0* 8.3*   CBG (last 3)  No results for input(s): GLUCAP in the last 72 hours.  Wt Readings from Last 3 Encounters:  11/29/16 92.3 kg (203 lb 7.8 oz)  11/25/16 96.9 kg (213 lb 10 oz)  11/25/16 90.7 kg (200 lb)    Physical  Exam:  BP 140/67 (BP Location: Right Arm)   Pulse 60   Temp 98.8 F (37.1 C) (Oral)   Resp 18   Ht 5\' 10"  (1.778 m)   Wt 92.3 kg (203 lb 7.8 oz)   SpO2 96%   BMI 29.20 kg/m  Constitutional: He appears well-developedand well-nourished. No distress.  HENT: Normocephalicand atraumatic.  Eyes: EOMI. No discharge. Cardiovascular: Normal rateand regular rhythm. No murmurheard. Respiratory: Effort normaland breath sounds normal. GI: Soft. Bowel sounds are normal.  Musculoskeletal: He exhibits edema(LUE), no tenderness.  Neurological: He is alertand oriented Dysarthria.  Fair insight and awareness.  Left facial weakness  LUE: 0/5 proximal to distal  RUE/RLE: 4/5 proximal to distal LLE: 2-/5 HF, KE, ADF/PF Skin: Skin is warmand dry. He is not diaphoretic.  Psychiatric:   Assessment/Plan: 1. Functional deficits secondary to embolic right MCA infarct which require 3+ hours per day of interdisciplinary therapy in a comprehensive inpatient rehab setting. Physiatrist is providing close team supervision and 24 hour management of active medical problems listed below. Physiatrist and rehab team continue to assess barriers to discharge/monitor patient progress toward functional and medical goals.  Function:  Bathing Bathing position      Bathing parts      Bathing assist  Upper Body Dressing/Undressing Upper body dressing                    Upper body assist        Lower Body Dressing/Undressing Lower body dressing                                  Lower body assist        Toileting Toileting          Toileting assist     Transfers Chair/bed transfer             Locomotion Ambulation           Wheelchair          Cognition Comprehension    Expression    Social Interaction    Problem Solving    Memory      Medical Problem List and Plan: 1. Left hemiparesis and speech/swallowing difficultiessecondary to embolic  right MCA infarct  Begin CIR  Notes reviewed, images reviewed. 2. DVT Prophylaxis/Anticoagulation: Pharmaceutical: Lovenox 3. Pain Management: tylenol prn  4. Mood: LCSW to follow for evaluation and support. Team to provide ego support--showing signs of adjustment reaction. 5. Neuropsych: This patient appears to be capable of making decisions on hisown behalf. 6. Skin/Wound Care: routine pressure relief measures.  7. Fluids/Electrolytes/Nutrition: Monitor I/Os  BMP within acceptable range on 7/28 8. Glaucoma: Continue Xalatan as at home. 9. ABLA:   Hb 12.2 on 7/28  Cont to monitor 10. Elevated triglycerides: Now on Lipitor.  11. HTN:   Monitor with increased mobility 12. Dysphagia: pt on D3 diet per acute SLP recs: pt very dysarthric with poor oro-motor control. Aspiration precautions 13. Hypoalbuminemia  Supplement initiated 7/28  LOS (Days) 1 A FACE TO FACE EVALUATION WAS PERFORMED  Alima Naser Karis Jubanil Russie Gulledge 11/30/2016 8:20 AM

## 2016-11-30 NOTE — Progress Notes (Signed)
   11/30/16 1110  What Happened  Was fall witnessed? No  Was patient injured? No  Patient found on floor  Found by Staff-comment (housekeeping said pt was on the floor per wife; )  Stated prior activity other (comment) (seated on wheelchair)  Follow Up  MD notified dr. Allena Katzpatel  Time MD notified 1120  Family notified Yes-comment (wife in room)  Time family notified (wife in room at time of fall)  Additional tests No  Progress note created (see row info) Yes  Adult Fall Risk Assessment  Risk Factor Category (scoring not indicated) High fall risk per protocol (document High fall risk)  Age 71  Fall History: Fall within 6 months prior to admission 0  Elimination; Bowel and/or Urine Incontinence 2  Elimination; Bowel and/or Urine Urgency/Frequency 2  Medications: includes PCA/Opiates, Anti-convulsants, Anti-hypertensives, Diuretics, Hypnotics, Laxatives, Sedatives, and Psychotropics 5  Patient Care Equipment 1  Mobility-Assistance 2  Mobility-Gait 2  Mobility-Sensory Deficit 0  Altered awareness of immediate physical environment 0  Impulsiveness 0  Lack of understanding of one's physical/cognitive limitations 4  Total Score 19  Patient's Fall Risk High Fall Risk (>13 points)  Screening for Fall Injury Risk  Risk For Fall Injury- See Row Information  None identified  Pain Assessment  Pain Assessment No/denies pain  Pain Score 0  PCA/Epidural/Spinal Assessment  Respiratory Pattern Regular;Unlabored  Neurological  Neuro (WDL) X (rt mca cva)  Level of Consciousness Alert  Orientation Level Oriented X4  Cognition Appropriate at baseline;Follows commands;Poor judgement;Poor Dance movement psychotherapistsafety awareness  Speech Slurred/Dysarthria;Expressive aphasia;Uses written communication;Nods/gestures appropriately  R Hand Grip Strong  L Hand Grip Absent   RUE Motor Response Purposeful movement  RUE Motor Strength 5  LUE Motor Response Non-purposeful movement  LUE Motor Strength 1  RLE Motor Response  Purposeful movement  RLE Motor Strength 5  LLE Motor Response Other (Comment)  LLE Motor Strength 1  Neuro Symptoms None  Musculoskeletal  Musculoskeletal (WDL) X  Assistive Device Wheelchair  Generalized Weakness Yes  Weight Bearing Restrictions No  Musculoskeletal Details  LUE Weakness;Limited movement  LLE Weakness  Integumentary  Integumentary (WDL) WDL

## 2016-11-30 NOTE — Anesthesia Postprocedure Evaluation (Signed)
Anesthesia Post Note  Patient: Jason CroakJames Pelfrey  Procedure(s) Performed: Procedure(s) (LRB): RADIOLOGY WITH ANESTHESIA (N/A)     Patient location during evaluation: NICU Anesthesia Type: General Level of consciousness: patient remains intubated per anesthesia plan and sedated Pain management: pain level controlled Vital Signs Assessment: post-procedure vital signs reviewed and stable Respiratory status: patient on ventilator - see flowsheet for VS and patient remains intubated per anesthesia plan Cardiovascular status: blood pressure returned to baseline Anesthetic complications: no    Last Vitals:  Vitals:   11/29/16 1046 11/29/16 1356  BP: (!) 142/73 (!) 141/69  Pulse: 70 73  Resp: 18 18  Temp: 37.6 C 36.8 C    Last Pain:  Vitals:   11/29/16 1356  TempSrc: Oral  PainSc:                  Keysi Oelkers COKER

## 2016-11-30 NOTE — Evaluation (Signed)
Speech Language Pathology Assessment and Plan  Patient Details  Name: Jason Nolan MRN: [id number] Date of Birth: Jul 06, 1945  SLP Diagnosis: Dysarthria;Apraxia;Dysphagia  Rehab Potential: Good ELOS: 17-21 days     Today's Date: 11/30/2016 SLP Individual Time: 1120-1200 SLP Individual Time Calculation (min): 40 min   Problem List:  Patient Active Problem List   Diagnosis Date Noted  . Hypoalbuminemia due to protein-calorie malnutrition ([place])   . Dysarthria, post-stroke   . Benign essential HTN   . Acute blood loss anemia   . Acute right MCA stroke ([place]) 11/29/2016  . Dysphagia, oropharyngeal 11/29/2016  . Essential hypertension 11/29/2016  . Cerebral embolism with cerebral infarction 11/25/2016  . Acute ischemic stroke ([place]) - R MCA embolic stroke in setting of ruptured R ICA plaque w dissection, s/p R MCA stent and thrombectomy 11/25/2016  . Leukopenia 03/31/2016  . Hepatitis C 07/22/2013   Past Medical History:  Past Medical History:  Diagnosis Date  . Glaucoma   . Hepatitis C    Past Surgical History:  Past Surgical History:  Procedure Laterality Date  . IR ANGIO INTRA EXTRACRAN SEL COM CAROTID INNOMINATE UNI L MOD SED  11/25/2016  . IR ANGIO VERTEBRAL SEL SUBCLAVIAN INNOMINATE UNI R MOD SED  11/25/2016  . IR ANGIO VERTEBRAL SEL VERTEBRAL UNI L MOD SED  11/25/2016  . IR INTRAVSC STENT CERV CAROTID W/O EMB-PROT MOD SED INC ANGIO  11/25/2016  . IR PERCUTANEOUS ART THROMBECTOMY/INFUSION INTRACRANIAL INC DIAG ANGIO  11/25/2016  . RADIOLOGY WITH ANESTHESIA N/A 11/25/2016   Procedure: RADIOLOGY WITH ANESTHESIA;  Surgeon: Radiologist, Medication, MD;  Location: [facility];  Service: Radiology;  Laterality: N/A;    Assessment / Plan / Recommendation Clinical Impression   Jason Nolan a 71 y.o.left handed male with history of Hep C-treated with Harvoni, glaucoma who was admitted via [facility] on 7/23 with left sided weakness, facial droop and slurred speech due to early changes of  R-MCA territory infarct. He underwent CTA brain showed short segment near occlusion of proximal R-ICA with associated intraluminal thrombus --likely due to dissection and emergent large vessel occlusion with right M2 occlusion and evolving right frontal lobe infarct. He underwent cerebral angio with complete revascularization of occluded superior division of R-MCA and near complete revascularization of proximal R-ICA with stent assisted angioplasty. MRI brain done revealing right insula and frontal parietal junction acute infarct, several punctate foci of acute infarct in right frontal lobe, right parietal lobe, right occipital lobe, and left superior cerebellar hemisphere and stable right sylvian/posterior frontal convexity extra axial signal from extravasated contrast and/or SAH.  He tolerated extubation on 7/25.  Stroke felt to be embolic in setting of ruptured plaque R-ICA and patient on ASA and brillinta due to stent. MBS done and patient started on dysphagia 3, thin liquids. Patient with resultant left sided weakness, expressive deficits with anarthria and dysphagia. Therapy evaluations done 7/26and CIR recommended due to deficits in mobility and ability to carry out ADL tasks.  SLP evaluation completed on 11/30/2016 with the following results:  Pt presents with a severe dysarthria bordering on anarthria which is further complicated by a mild-moderate verbal apraxia.  Significant oral motor weakness results in very impaired articulatory precision and reduces intelligibility at the word level.  Furthermore, pt demonstrated decreased initiation of verbal expression and has periods of groping with inconsistent and irregular production of words.  Pt was able to write his needs and wants at the phrase level with his nondominant hand for~80% accuracy with errors most consistent  with letter formation and legibility.   Additionally, pt presents with a mild orally based dysphagia with prolonged but effective  mastication of regular textures.  No overt s/s of aspiration were evident with solids or liquids, even when challenged with large, consecutive straw sips.   Given the abovementioned deficits, pt would benefit from skilled ST while inpatient in order to maximize functional independence and reduce burden of care prior to discharge.  Anticipate that pt will need ST follow up at next level of care, ideally as an outpatient.    Skilled Therapeutic Interventions          Cognitive-linguistic evaluation completed with results and recommendations reviewed with patient and family.  Evaluation began late due to pt being found down in his room by housekeeping staff.  Wife was present.  Pt undergoing RN work up upon therapist's arrival.  As a result, pt missed 20 min of skilled ST.  Pt needed max assist for intelligibility to facilitate verbal communication of his needs/wants at the word level but was overall min assist for multimodal communication, specifically written expression and gestures.  Pt seemed aware of verbal errors but needed max assist to correct them.  Pt was left in wheelchair with wife at bedside and quick release belt donned for safety.  Call bell within reach.  Continue per current plan of care.      SLP Assessment  Patient will need skilled Speech Lanaguage Pathology Services during CIR admission    Recommendations  SLP Diet Recommendations: Dysphagia 3 (Mech soft);Thin Liquid Administration via: Cup;Straw Medication Administration: Whole meds with liquid Supervision: Patient able to self feed;Intermittent supervision to cue for compensatory strategies Compensations: Lingual sweep for clearance of pocketing;Minimize environmental distractions;Slow rate Postural Changes and/or Swallow Maneuvers: Seated upright 90 degrees Oral Care Recommendations: Oral care QID Patient destination: Home Follow up Recommendations: Outpatient SLP Equipment Recommended: None recommended by SLP    SLP  Frequency 3 to 5 out of 7 days   SLP Duration  SLP Intensity  SLP Treatment/Interventions 17-21 days   Minumum of 1-2 x/day, 30 to 90 minutes  Cueing hierarchy;Dysphagia/aspiration precaution training;Functional tasks;Internal/external aids;Multimodal communication approach;Patient/family education;Speech/Language facilitation    Pain Pain Assessment Pain Assessment: No/denies pain Pain Score: 0-No pain  Prior Functioning Cognitive/Linguistic Baseline: Within functional limits Type of Home: House  Lives With: Spouse Available Help at Discharge: Family;Available 24 hours/day Vocation: Retired  Function:  Eating Eating   Modified Consistency Diet: Yes Eating Assist Level: More than reasonable amount of time           Cognition Comprehension Comprehension assist level: Follows basic conversation/direction with no assist  Expression   Expression assist level: Expresses basic 25 - 49% of the time/requires cueing 50 - 75% of the time. Uses single words/gestures.  Social Interaction Social Interaction assist level: Interacts appropriately 90% of the time - Needs monitoring or encouragement for participation or interaction.  Problem Solving Problem solving assist level: Solves basic 90% of the time/requires cueing < 10% of the time  Memory Memory assist level: Recognizes or recalls 90% of the time/requires cueing < 10% of the time   Short Term Goals: Week 1: SLP Short Term Goal 1 (Week 1): Pt will consume dys 3 textures and thin liquids with mod I use of swallowing precautions over 2 consecutive sessions.  SLP Short Term Goal 2 (Week 1): Pt will consume trials of regular textures with mod I use of swallowing precautions over 2 consecutive sessions prior to advancement.   SLP  Short Term Goal 3 (Week 1): Pt will utilize overarticulation to achieve intelligibility at the word level with mod assist multimodal cues.   SLP Short Term Goal 4 (Week 1): Pt will communicate his immediate  needs and wants via multimodal means with supervision verbal cues.   SLP Short Term Goal 5 (Week 1): Pt will recognize and correct verbal errors at the word level with mod assist verbal cues.    Refer to Care Plan for Long Term Goals  Recommendations for other services: None   Discharge Criteria: Patient will be discharged from SLP if patient refuses treatment 3 consecutive times without medical reason, if treatment goals not met, if there is a change in medical status, if patient makes no progress towards goals or if patient is discharged from hospital.  The above assessment, treatment plan, treatment alternatives and goals were discussed and mutually agreed upon: by patient and by family  Aneth Schlagel, Selinda Orion 11/30/2016, 12:45 PM

## 2016-11-30 NOTE — Evaluation (Signed)
Physical Therapy Assessment and Plan  Patient Details  Name: Jason Nolan MRN: [id number] Date of Birth: 10-Jul-1945  PT Diagnosis: Abnormality of gait, Hemiplegia dominant, Impaired sensation and Muscle weakness Rehab Potential: Excellent ELOS: 18-21days    Today's Date: 11/30/2016 PT Individual Time: 5809-9833 PT Individual Time Calculation (min): 74 min    Problem List:  Patient Active Problem List   Diagnosis Date Noted  . Hypoalbuminemia due to protein-calorie malnutrition ([place])   . Dysarthria, post-stroke   . Benign essential HTN   . Acute blood loss anemia   . Acute right MCA stroke ([place]) 11/29/2016  . Dysphagia, oropharyngeal 11/29/2016  . Essential hypertension 11/29/2016  . Cerebral embolism with cerebral infarction 11/25/2016  . Acute ischemic stroke ([place]) - R MCA embolic stroke in setting of ruptured R ICA plaque w dissection, s/p R MCA stent and thrombectomy 11/25/2016  . Leukopenia 03/31/2016  . Hepatitis C 07/22/2013    Past Medical History:  Past Medical History:  Diagnosis Date  . Glaucoma   . Hepatitis C    Past Surgical History:  Past Surgical History:  Procedure Laterality Date  . IR ANGIO INTRA EXTRACRAN SEL COM CAROTID INNOMINATE UNI L MOD SED  11/25/2016  . IR ANGIO VERTEBRAL SEL SUBCLAVIAN INNOMINATE UNI R MOD SED  11/25/2016  . IR ANGIO VERTEBRAL SEL VERTEBRAL UNI L MOD SED  11/25/2016  . IR INTRAVSC STENT CERV CAROTID W/O EMB-PROT MOD SED INC ANGIO  11/25/2016  . IR PERCUTANEOUS ART THROMBECTOMY/INFUSION INTRACRANIAL INC DIAG ANGIO  11/25/2016  . RADIOLOGY WITH ANESTHESIA N/A 11/25/2016   Procedure: RADIOLOGY WITH ANESTHESIA;  Surgeon: Radiologist, Medication, MD;  Location: [facility];  Service: Radiology;  Laterality: N/A;    Assessment & Plan Clinical Impression: Patient is a 71 y.o.left handed male with history of Hep C-treated with Harvoni, glaucoma who was admitted via [facility] on 7/23 with left sided weakness, facial droop and slurred speech due to  early changes of R-MCA territory infarct. He underwent CTA brain showed short segment near occlusion of proximal R-ICA with associated intraluminal thrombus --likely due to dissection and emergent large vessel occlusion with right M2 occlusion and evolving right frontal lobe infarct. He underwent cerebral angio with complete revascularization of occluded superior division of R-MCA and near complete revascularization of proximal R-ICA with stent assisted angioplasty. MRI brain done revealing right insula and frontal parietal junction acute infarct, several punctate foci of acute infarct in right frontal lobe, right parietal lobe, right occipital lobe, and left superior cerebellar hemisphere and stable right sylvian/posterior frontal convexity extra axial signal from extravasated contrast and/or SAH.  He tolerated extubation on 7/25. 2 D echo with EF 60-65% with no wall abnormality. Carotid dopplers showed 40-50% right proximal ICA stenosis, L- 1-39% stenosis and elevated velocities in B-VA. Stroke felt to be embolic in setting of ruptured plaque R-ICA and patient on ASA and brillinta due to stent. MBS done and patient started on dysphagia 3, thin liquids. Patient with resultant left sided weakness, expressive deficits with aphonia and dysphagia.  Patient transferred to CIR on 11/29/2016 .   Patient currently requires max with mobility secondary to muscle weakness, unbalanced muscle activation and decreased coordination and decreased sitting balance, decreased standing balance, decreased postural control, hemiplegia and decreased balance strategies.  Prior to hospitalization, patient was independent  with mobility and lived with Spouse in a House home.  Home access is  Level entry.  Patient will benefit from skilled PT intervention to maximize safe functional mobility, minimize fall risk  and decrease caregiver burden for planned discharge home with 24 hour supervision.  Anticipate patient will benefit from  follow up [place] at discharge.  PT - End of Session Activity Tolerance: Tolerates 30+ min activity with multiple rests Endurance Deficit: Yes PT Assessment Rehab Potential (ACUTE/IP ONLY): Excellent PT Barriers to Discharge: Decreased caregiver support PT Patient demonstrates impairments in the following area(s): Balance;Behavior;Endurance;Motor;Nutrition;Pain;Perception;Safety;Sensory;Skin Integrity PT Transfers Functional Problem(s): Bed Mobility;Bed to Chair;Car;Floor;Furniture PT Locomotion Functional Problem(s): Ambulation;Wheelchair Mobility;Stairs PT Plan PT Intensity: Minimum of 1-2 x/day ,45 to 90 minutes PT Frequency: 5 out of 7 days PT Duration Estimated Length of Stay: 18-21days  PT Treatment/Interventions: Ambulation/gait training;Balance/vestibular training;Community reintegration;Discharge planning;Disease management/prevention;DME/adaptive equipment instruction;Functional electrical stimulation;Functional mobility training;Neuromuscular re-education;Patient/family education;Pain management;Psychosocial support;Skin care/wound management;Splinting/orthotics;Stair training;Therapeutic Activities;Therapeutic Exercise;UE/LE Strength taining/ROM;UE/LE Coordination activities;Visual/perceptual remediation/compensation;Wheelchair propulsion/positioning PT Transfers Anticipated Outcome(s): supervision assist with LRAD  PT Locomotion Anticipated Outcome(s): Ambulation with Supevision assist with LRAD at household level. Mod I at [facility] level.  PT Recommendation Follow Up Recommendations: Home health PT Patient destination: Home Equipment Recommended: Rolling walker with 5" wheels;Wheelchair cushion (measurements);Wheelchair (measurements);To be determined  Skilled Therapeutic Intervention PT instructed patient in PT Evaluation and initiated treatment intervention; see below for results. PT educated patient in [place], rehab potential, rehab goals, and discharge recommendations. PT instructed pt in  gait without AD as listed below as well as with HW x 19f and max assist. PT also instructed pt in in various transfers including car transfer with max assist progressing to Mod assist throughout tx.  Patient returned to room and left sitting in recliner with lap tray over lap, call bell in reach, and all needs met.      PT Evaluation Precautions/Restrictions Precautions Precautions: Fall Restrictions Weight Bearing Restrictions: No General   Vital Signs  Pain Pain Assessment Pain Assessment: No/denies pain Pain Score: 0-No pain Home Living/Prior Functioning Home Living Available Help at Discharge: Family;Available 24 hours/day Type of Home: House Home Access: Level entry Home Layout: One level Bathroom Shower/Tub: Walk-in shower;Door BConocoPhillipsToilet: Standard Bathroom Accessibility: Yes  Lives With: Spouse Prior Function Level of Independence: Independent with basic ADLs;Independent with gait  Able to Take Stairs?: Yes Driving: Yes Vocation: Retired Comments: Pt is retired. States he exercises Vision/Perception  Perception Perception: Within Functional Limits Praxis Praxis: Intact  Cognition Overall Cognitive Status: Within Functional Limits for tasks assessed Arousal/Alertness: Awake/alert Orientation Level: Oriented X4 Comments: Expressive aphasia. able to write appropriate answers on paper.  Sensation Sensation Light Touch: Impaired Detail Light Touch Impaired Details: Impaired LUE;Impaired LLE Proprioception: Impaired by gross assessment Additional Comments: pt reports parathesia on the LLE and LUE with light tough testing. able to detect 75% of stimuli  Coordination Gross Motor Movements are Fluid and Coordinated: No Fine Motor Movements are Fluid and Coordinated: No Finger Nose Finger Test: unable to complete on LUE Heel Shin Test: decreased speed and accuracy due to hemiplegia Motor  Motor Motor: Hemiplegia Motor - Skilled Clinical Observations: L  hemiplegia UE>LE  Mobility Bed Mobility Bed Mobility: Rolling Right;Rolling Left;Supine to Sit;Sit to Supine Rolling Right: 4: Min assist Rolling Right Details: Verbal cues for precautions/safety;Verbal cues for safe use of DME/AE;Manual facilitation for placement;Verbal cues for sequencing;Verbal cues for technique Rolling Left: 4: Min assist Rolling Left Details: Verbal cues for technique;Verbal cues for precautions/safety;Verbal cues for safe use of DME/AE;Manual facilitation for placement;Verbal cues for sequencing Supine to Sit: 4: Min assist Supine to Sit Details: Verbal cues for technique;Verbal cues for precautions/safety;Verbal cues for safe use of DME/AE;Verbal cues for sequencing;Manual  facilitation for placement Sit to Supine: 4: Min assist Sit to Supine - Details: Verbal cues for precautions/safety;Verbal cues for technique;Verbal cues for sequencing;Verbal cues for safe use of DME/AE;Manual facilitation for placement Transfers Transfers: Yes Sit to Stand: 3: Mod assist Sit to Stand Details: Verbal cues for technique;Verbal cues for precautions/safety;Verbal cues for sequencing;Verbal cues for safe use of DME/AE Stand Pivot Transfers: 2: Max assist Stand Pivot Transfer Details: Verbal cues for sequencing;Verbal cues for precautions/safety;Visual cues/gestures for sequencing;Verbal cues for gait pattern;Verbal cues for safe use of DME/AE Squat Pivot Transfers: 3: Mod assist Squat Pivot Transfer Details: Verbal cues for precautions/safety;Verbal cues for technique;Verbal cues for safe use of DME/AE;Verbal cues for sequencing;Manual facilitation for placement Locomotion  Ambulation Ambulation: Yes Ambulation/Gait Assistance: 2: Max assist Ambulation Distance (Feet): 20 Feet Assistive device: None;1 person hand held assist Ambulation/Gait Assistance Details: Verbal cues for gait pattern;Verbal cues for precautions/safety;Verbal cues for technique;Verbal cues for safe use of  DME/AE;Manual facilitation for placement;Manual facilitation for weight shifting Gait Gait: Yes Gait Pattern: Step-to pattern;Decreased stance time - left;Decreased step length - left;Decreased trunk rotation;Lateral hip instability;Lateral trunk lean to left;Poor foot clearance - left Stairs / Additional Locomotion Stairs: Yes Stairs Assistance: 2: Max Industrial/product designer Assistance Details: Verbal cues for sequencing;Verbal cues for technique;Verbal cues for precautions/safety;Verbal cues for gait pattern;Verbal cues for safe use of DME/AE;Manual facilitation for placement;Manual facilitation for weight shifting Stair Management Technique: One rail Right Number of Stairs: 4 Height of Stairs: 3 Wheelchair Mobility Wheelchair Mobility: Yes Wheelchair Assistance: 4: Advertising account executive Details: Verbal cues for technique;Verbal cues for safe use of DME/AE Wheelchair Propulsion: Right upper extremity;Right lower extremity Wheelchair Parts Management: Needs assistance Distance: 155f   Trunk/Postural Assessment  Cervical Assessment Cervical Assessment: Within Functional Limits Thoracic Assessment Thoracic Assessment: Within Functional Limits Lumbar Assessment Lumbar Assessment: Within Functional Limits Postural Control Postural Control: Deficits on evaluation Protective Responses: delayed   Balance Balance Balance Assessed: Yes Static Sitting Balance Static Sitting - Balance Support: No upper extremity supported Static Sitting - Level of Assistance: 5: Stand by assistance Dynamic Sitting Balance Dynamic Sitting - Balance Support: No upper extremity supported Dynamic Sitting - Level of Assistance: 4: Min assist;5: Stand by aArmed forces logistics/support/administrative officerStanding - Balance Support: No upper extremity supported Static Standing - Level of Assistance: 2: Max assist;3: Mod assist Dynamic Standing Balance Dynamic Standing - Balance Support: No upper extremity  supported;During functional activity Dynamic Standing - Level of Assistance: 2: Max assist Extremity Assessment      RLE Assessment RLE Assessment: Within Functional Limits LLE Assessment LLE Assessment: Exceptions to WFL LLE AROM (degrees) LLE Overall AROM Comments: lacking ~10degrees full knee extension and ~5 degrees full ankle DF.   LLE Strength LLE Overall Strength Comments: 4-/5 proxmial to distal with delayed activation.    See Function Navigator for Current Functional Status.   Refer to Care Plan for Long Term Goals  Recommendations for other services: Therapeutic Recreation  Pet therapy and Stress management  Discharge Criteria: Patient will be discharged from PT if patient refuses treatment 3 consecutive times without medical reason, if treatment goals not met, if there is a change in medical status, if patient makes no progress towards goals or if patient is discharged from hospital.  The above assessment, treatment plan, treatment alternatives and goals were discussed and mutually agreed upon: by patient  ALorie Phenix7/28/2018, 10:15 AM

## 2016-12-01 DIAGNOSIS — G8320 Monoplegia of upper limb affecting unspecified side: Secondary | ICD-10-CM | POA: Insufficient documentation

## 2016-12-01 NOTE — Progress Notes (Signed)
Orthopedic Tech Progress Note Patient Details:  Jason Nolan March 15, 1946 [id number]  Patient ID: Jason Nolan, male   DOB: March 15, 1946, 71 y.o.   MRN: [id number]   [name] 12/01/2016, 12:56 PMCalled [employer] for braces.

## 2016-12-01 NOTE — Progress Notes (Signed)
Orthopedic Tech Progress Note Patient Details:  Jason CroakJames Nolan [birth year] [id number]  Ortho Devices Type of Ortho Device: Velcro wrist splint Ortho Device/Splint Interventions: Application   Saul FordyceJennifer C Jame Seelig 12/01/2016, 1:19 PM

## 2016-12-01 NOTE — Progress Notes (Addendum)
[place] PHYSICAL MEDICINE & REHABILITATION     PROGRESS NOTE  Subjective/Complaints:  Pt seen laying in bed this AM.  He states he slept well overnight and had a good first day of therapies.    ROS: Denies CP, SOB, N/V/D.  Objective: Vital Signs: Blood pressure 132/72, pulse 87, temperature 99.1 F (37.3 C), temperature source Oral, resp. rate 18, height 5\' 10"  (1.778 m), weight 92.3 kg (203 lb 7.8 oz), SpO2 96 %. No results found.  Recent Labs  11/30/16 0525  WBC 3.8*  HGB 12.2*  HCT 36.4*  PLT 161    Recent Labs  11/30/16 0525  NA 138  K 3.6  CL 107  GLUCOSE 94  BUN 10  CREATININE 1.01  CALCIUM 8.3*   CBG (last 3)  No results for input(s): GLUCAP in the last 72 hours.  Wt Readings from Last 3 Encounters:  11/29/16 92.3 kg (203 lb 7.8 oz)  11/25/16 96.9 kg (213 lb 10 oz)  11/25/16 90.7 kg (200 lb)    Physical Exam:  BP 132/72 (BP Location: Right Arm)   Pulse 87   Temp 99.1 F (37.3 C) (Oral)   Resp 18   Ht 5\' 10"  (1.778 m)   Wt 92.3 kg (203 lb 7.8 oz)   SpO2 96%   BMI 29.20 kg/m  Constitutional: He appears well-developedand well-nourished. No distress.  HENT: Normocephalicand atraumatic.  Eyes: EOMI. No discharge. Cardiovascular: RRR. No murmurheard. Respiratory: Effort normal breath sounds normal. GI: Soft. Bowel sounds are normal.  Musculoskeletal: He exhibits edema(LUE), no tenderness.  Neurological: He is alertand oriented Dysarthria.  Fair insight and awareness.  Left facial weakness  LUE: 0/5 proximal to distal  RUE/RLE: 4/5 proximal to distal LLE: 2/5 HF, KE, ADF/PF Skin: Skin is warmand dry. He is not diaphoretic.  Psychiatric:   Assessment/Plan: 1. Functional deficits secondary to embolic right MCA infarct which require 3+ hours per day of interdisciplinary therapy in a comprehensive inpatient rehab setting. Physiatrist is providing close team supervision and 24 hour management of active medical problems listed  below. Physiatrist and rehab team continue to assess barriers to discharge/monitor patient progress toward functional and medical goals.  Function:  Bathing Bathing position   Position: Sitting [name]  Bathing parts Body parts bathed by patient: Chest, Abdomen, Front perineal area, Right upper leg, Left upper leg Body parts bathed by helper: Right arm, Left arm, Buttocks, Right lower leg, Left lower leg, Back  Bathing assist Assist Level:  (mod A)      Upper Body Dressing/Undressing Upper body dressing   What is the patient wearing?: Hospital gown                Upper body assist Assist Level: Touching or steadying assistance(Pt > 75%)      Lower Body Dressing/Undressing Lower body dressing   What is the patient wearing?: Non-skid slipper socks           Non-skid slipper socks- Performed by helper: Don/doff right sock, Don/doff left sock                  Lower body assist        Toileting Toileting Toileting activity did not occur: No continent bowel/bladder event        Toileting assist     Transfers Chair/bed transfer   Chair/bed transfer method: Stand pivot Chair/bed transfer assist level: Maximal assist (Pt 25 - 49%/lift and lower)       Locomotion Ambulation  Max distance: 3820ft Assist level: Maximal assist (Pt 25 - 49%)   Wheelchair   Type: Manual Max wheelchair distance: 12550ft Assist Level: Touching or steadying assistance (Pt > 75%)  Cognition Comprehension Comprehension assist level: Follows basic conversation/direction with no assist  Expression Expression assist level: Expresses basic 25 - 49% of the time/requires cueing 50 - 75% of the time. Uses single words/gestures.  Social Interaction Social Interaction assist level: Interacts appropriately 90% of the time - Needs monitoring or encouragement for participation or interaction.  Problem Solving Problem solving assist level: Solves basic 75 - 89% of the time/requires cueing 10 - 24%  of the time  Memory Memory assist level: Recognizes or recalls 75 - 89% of the time/requires cueing 10 - 24% of the time    Medical Problem List and Plan: 1. Left hemiparesis and speech/swallowing difficultiessecondary to embolic right MCA infarct  Cont CIR  WHO ordered 2. DVT Prophylaxis/Anticoagulation: Pharmaceutical: Lovenox 3. Pain Management: tylenol prn  4. Mood: LCSW to follow for evaluation and support. Team to provide ego support--showing signs of adjustment reaction. 5. Neuropsych: This patient appears to be capable of making decisions on hisown behalf. 6. Skin/Wound Care: routine pressure relief measures.  7. Fluids/Electrolytes/Nutrition: Monitor I/Os  BMP within acceptable range on 7/28 8. Glaucoma: Continue Xalatan as at home. 9. ABLA:   Hb 12.2 on 7/28  Cont to monitor 10. Elevated triglycerides: Now on Lipitor.  11. HTN:   Controlled 7/29 12. Dysphagia: pt on D3 diet per acute SLP recs: pt very dysarthric with poor oro-motor control. Aspiration precautions 13. Hypoalbuminemia  Supplement initiated 7/28  LOS (Days) 2 A FACE TO FACE EVALUATION WAS PERFORMED  Ankit Karis Jubanil Patel 12/01/2016 7:35 AM

## 2016-12-02 ENCOUNTER — Inpatient Hospital Stay (HOSPITAL_COMMUNITY): Payer: Medicare Other | Admitting: Occupational Therapy

## 2016-12-02 ENCOUNTER — Inpatient Hospital Stay (HOSPITAL_COMMUNITY): Payer: Medicare Other | Admitting: Speech Pathology

## 2016-12-02 ENCOUNTER — Inpatient Hospital Stay (HOSPITAL_COMMUNITY): Payer: Medicare Other | Admitting: Physical Therapy

## 2016-12-02 MED ORDER — PROCHLORPERAZINE MALEATE 5 MG PO TABS: 5.0000 mg | ORAL_TABLET | Freq: Four times a day (QID) | ORAL | Status: DC | PRN

## 2016-12-02 MED ORDER — PROCHLORPERAZINE EDISYLATE 5 MG/ML IJ SOLN: 5.0000 mg | Freq: Four times a day (QID) | INTRAMUSCULAR | Status: DC | PRN

## 2016-12-02 MED ORDER — TRAZODONE HCL 50 MG PO TABS
25.0000 mg | ORAL_TABLET | Freq: Every evening | ORAL | Status: DC | PRN
  Administered 2016-12-03 – 2016-12-10 (×5): 25 mg via ORAL
  Filled 2016-12-02 (×6): qty 1

## 2016-12-02 MED ORDER — GUAIFENESIN-DM 100-10 MG/5ML PO SYRP: 10.0000 mL | ORAL_SOLUTION | Freq: Four times a day (QID) | ORAL | Status: DC | PRN

## 2016-12-02 MED ORDER — DIPHENHYDRAMINE HCL 12.5 MG/5ML PO ELIX
12.5000 mg | ORAL_SOLUTION | Freq: Four times a day (QID) | ORAL | Status: DC | PRN
Start: 2016-12-02 — End: 2016-12-21

## 2016-12-02 MED ORDER — ACETAMINOPHEN 325 MG PO TABS: 650.0000 mg | ORAL_TABLET | ORAL | Status: DC | PRN

## 2016-12-02 MED ORDER — PROCHLORPERAZINE 25 MG RE SUPP: 12.5000 mg | Freq: Four times a day (QID) | RECTAL | Status: DC | PRN

## 2016-12-02 NOTE — Progress Notes (Signed)
[place] PHYSICAL MEDICINE & REHABILITATION     PROGRESS NOTE  Subjective/Complaints:  No issues overnite, pt eating breakfast good intake  ROS: Denies CP, SOB, N/V/D.  Objective: Vital Signs: Blood pressure 120/61, pulse (!) 57, temperature 98.9 F (37.2 C), temperature source Oral, resp. rate 18, height 5\' 10"  (1.778 m), weight 92.3 kg (203 lb 7.8 oz), SpO2 96 %. No results found.  Recent Labs  11/30/16 0525  WBC 3.8*  HGB 12.2*  HCT 36.4*  PLT 161    Recent Labs  11/30/16 0525  NA 138  K 3.6  CL 107  GLUCOSE 94  BUN 10  CREATININE 1.01  CALCIUM 8.3*   CBG (last 3)  No results for input(s): GLUCAP in the last 72 hours.  Wt Readings from Last 3 Encounters:  11/29/16 92.3 kg (203 lb 7.8 oz)  11/25/16 96.9 kg (213 lb 10 oz)  11/25/16 90.7 kg (200 lb)    Physical Exam:  BP 120/61 (BP Location: Right Arm)   Pulse (!) 57   Temp 98.9 F (37.2 C) (Oral)   Resp 18   Ht 5\' 10"  (1.778 m)   Wt 92.3 kg (203 lb 7.8 oz)   SpO2 96%   BMI 29.20 kg/m  Constitutional: He appears well-developedand well-nourished. No distress.  HENT: Normocephalicand atraumatic.  Eyes: EOMI. No discharge. Cardiovascular: RRR. No murmurheard. Respiratory: Effort normal breath sounds normal. GI: Soft. Bowel sounds are normal.  Musculoskeletal: He exhibits edema(LUE), no tenderness.  Neurological: He is alertand oriented Dysarthria.  Fair insight and awareness.  Left facial weakness  LUE: 0/5 proximal to distal  RUE/RLE: 4/5 proximal to distal LLE: 2/5 HF, KE, ADF/PF Skin: Skin is warmand dry. He is not diaphoretic.  Psychiatric:   Assessment/Plan: 1. Functional deficits secondary to embolic right MCA infarct which require 3+ hours per day of interdisciplinary therapy in a comprehensive inpatient rehab setting. Physiatrist is providing close team supervision and 24 hour management of active medical problems listed below. Physiatrist and rehab team continue to assess  barriers to discharge/monitor patient progress toward functional and medical goals.  Function:  Bathing Bathing position   Position: Sitting [name]  Bathing parts Body parts bathed by patient: Chest, Abdomen, Front perineal area, Right upper leg, Left upper leg Body parts bathed by helper: Right arm, Left arm, Buttocks, Right lower leg, Left lower leg, Back  Bathing assist Assist Level:  (mod A)      Upper Body Dressing/Undressing Upper body dressing   What is the patient wearing?: Hospital gown                Upper body assist Assist Level: Touching or steadying assistance(Pt > 75%)      Lower Body Dressing/Undressing Lower body dressing   What is the patient wearing?: Non-skid slipper socks           Non-skid slipper socks- Performed by helper: Don/doff right sock, Don/doff left sock                  Lower body assist        Toileting Toileting Toileting activity did not occur: No continent bowel/bladder event        Toileting assist     Transfers Chair/bed transfer   Chair/bed transfer method: Stand pivot Chair/bed transfer assist level: Maximal assist (Pt 25 - 49%/lift and lower)       Locomotion Ambulation     Max distance: 1420ft Assist level: Maximal assist (Pt 25 - 49%)  Wheelchair   Type: Manual Max wheelchair distance: 11950ft Assist Level: Touching or steadying assistance (Pt > 75%)  Cognition Comprehension Comprehension assist level: Follows basic conversation/direction with no assist  Expression Expression assist level: Expresses basic 25 - 49% of the time/requires cueing 50 - 75% of the time. Uses single words/gestures.  Social Interaction Social Interaction assist level: Interacts appropriately 90% of the time - Needs monitoring or encouragement for participation or interaction.  Problem Solving Problem solving assist level: Solves basic 75 - 89% of the time/requires cueing 10 - 24% of the time  Memory Memory assist level: Recognizes  or recalls 75 - 89% of the time/requires cueing 10 - 24% of the time    Medical Problem List and Plan: 1. Left hemiparesis and speech/swallowing difficultiessecondary to embolic right MCA infarct  Cont CIR PT, OT, SLP-Brillinta   2. DVT Prophylaxis/Anticoagulation: Pharmaceutical: Lovenox 3. Pain Management: tylenol prn  4. Mood: LCSW to follow for evaluation and support. Team to provide ego support--showing signs of adjustment reaction. 5. Neuropsych: This patient appears to be capable of making decisions on hisown behalf. 6. Skin/Wound Care: routine pressure relief measures.  7. Fluids/Electrolytes/Nutrition: Monitor I/Os  BMP within acceptable range on 7/28 8. Glaucoma: Continue Xalatan as at home. 9. ABLA:   Hb 12.2 on 7/28  Cont to monitor 10. Elevated triglycerides: Now on Lipitor.  11. HTN: Controlled   Vitals:   12/01/16 1302 12/02/16 0412  BP: (!) 142/66 120/61  Pulse: 65 (!) 57  Resp: 18 18  Temp: 98.3 F (36.8 C) 98.9 F (37.2 C)   12. Dysphagia: pt on D3 diet per acute SLP recs: pt very dysarthric with poor oro-motor control. Aspiration precautions 13. Hypoalbuminemia  Supplement initiated 7/28  LOS (Days) 3 A FACE TO FACE EVALUATION WAS PERFORMED  KIRSTEINS,ANDREW E 12/02/2016 8:07 AM

## 2016-12-02 NOTE — Progress Notes (Addendum)
Occupational Therapy Session Note  Patient Details  Name: Jason Nolan MRN: [id number] Date of Birth: [birth year]  Today's Date: 12/02/2016  Session 1 OT Individual Time: 1005-1100 OT Individual Time Calculation (min): 55 min   Session 2 OT Individual Time: 1400-1500 OT Individual Time Calculation (min): 60 min    Short Term Goals: Week 1:  OT Short Term Goal 1 (Week 1): Pt will perform shower transfer with mod A. OT Short Term Goal 2 (Week 1): Pt will perform UB dressing with min A in order to decrease level of assist with self care. OT Short Term Goal 3 (Week 1): Pt will display min A standing balance during LB clothing management.  Skilled Therapeutic Interventions/Progress Updates:  Session 1   OT treatment session focused on L NMR, transfer training, sitting balance, standing balance, and modified bathing/dressing. Pt completed bed mobility with min A to advance LLE. Pt completed stand-pivot to R side with min A to facilitate pivot. Stand-pivot to tub bench on L with Mod A. Facilitated weight bearing with hand-over hand A to integrate L UE into bathing tasks. Mod A to maintain standing balance while OT assisted with washing buttocks. Min A stand-pivot to R out of shower. Educated pt on hemi dressing techniques and pt able to achieve figure 4 position with L LE and min A to maintain position while reaching to thread pant leg> Min A sit<>stand with Min A for balance while OT assisted with pulling pants over hips. Min A to thread L UE into shirt and assistance to pull down back. OT added shoe buttons to tennis shoes and demonstrated use. Pt left seated in wc with L UE support on 1/2 lap tray.   Session 2 OT treatment session focused on L NMR and L NMES. Pt completed stand-pivot transfers with min A. Pt brought into gravity eliminated side-lying position for L NMR with joint input to bring pt through full ROM. Scap protraction/retraction, shoulder flex/ext, shoulder abduction/adduction,  elbow flex/ext, forearm pronation/supination, wrist/flex ext. Pt with good activation of biceps and triceps today. 1:1 NMES applied to wrist extensors.  Ratio 1:1 Rate 35 pps Waveform- Asymmetric Ramp 1.0 Pulse 300 Intensity- 5 Duration - 15 mins    Report of pain at the beginning of session 0 Report of pain at the end of session 0  No adverse reactions after treatment and is skin intact.   Pt returned to room at end of session and left seated in wc with needs met and call bell in reach.     Therapy Documentation Precautions:  Precautions Precautions: Fall Restrictions Weight Bearing Restrictions: No Pain:   none/denies pain See Function Navigator for Current Functional Status.   Therapy/Group: Individual Therapy  Valma Cava 12/02/2016, 3:47 PM

## 2016-12-02 NOTE — Progress Notes (Signed)
Speech Language Pathology Daily Session Note  Patient Details  Name: Jason Nolan MRN: [id number] Date of Birth: 1945-05-12  Today's Date: 12/02/2016 SLP Individual Time: 0900-0945 SLP Individual Time Calculation (min): 45 min  Short Term Goals: Week 1: SLP Short Term Goal 1 (Week 1): Pt will consume dys 3 textures and thin liquids with mod I use of swallowing precautions over 2 consecutive sessions.  SLP Short Term Goal 2 (Week 1): Pt will consume trials of regular textures with mod I use of swallowing precautions over 2 consecutive sessions prior to advancement.   SLP Short Term Goal 3 (Week 1): Pt will utilize overarticulation to achieve intelligibility at the word level with mod assist multimodal cues.   SLP Short Term Goal 4 (Week 1): Pt will communicate his immediate needs and wants via multimodal means with supervision verbal cues.   SLP Short Term Goal 5 (Week 1): Pt will recognize and correct verbal errors at the word level with mod assist verbal cues.    Skilled Therapeutic Interventions: Skilled treatment session focused on speech goals. SLP facilitated session by providing Max A verbal cues for use of of speech intelligibility strategies and for emergent awareness of errors at the phrase level during an informal conversation. Patient was ~50% intelligible and independently utilized writing to maximize communication during communication breakdown. Patient left upright in bed with all needs within reach. Continue with current plan of care.      Function:  Cognition Comprehension Comprehension assist level: Follows basic conversation/direction with no assist  Expression   Expression assist level: Expresses basic 50 - 74% of the time/requires cueing 25 - 49% of the time. Needs to repeat parts of sentences.  Social Interaction Social Interaction assist level: Interacts appropriately 90% of the time - Needs monitoring or encouragement for participation or interaction.  Problem  Solving Problem solving assist level: Solves basic 75 - 89% of the time/requires cueing 10 - 24% of the time  Memory Memory assist level: Recognizes or recalls 75 - 89% of the time/requires cueing 10 - 24% of the time    Pain No/Denies Pain   Therapy/Group: Individual Therapy  Jovanna Hodges 12/02/2016, 3:59 PM

## 2016-12-02 NOTE — IPOC Note (Signed)
Overall Plan of Care [facility](IPOC) Patient Details Name: Jason CroakJames Nolan MRN: [id number] DOB: Jun 21, 1945  Admitting Diagnosis: R CVA  Hospital Problems: Principal Problem:   Acute right MCA stroke (HCC) Active Problems:   Dysphagia, oropharyngeal   Essential hypertension   Hypoalbuminemia due to protein-calorie malnutrition (HCC)   Dysarthria, post-stroke   Benign essential HTN   Acute blood loss anemia   Flaccid monoplegia of upper extremity (HCC)     Functional Problem List: Nursing Bowel, Nutrition, Edema, Medication Management, Safety, Motor, Skin Integrity  PT Balance, Behavior, Endurance, Motor, Nutrition, Pain, Perception, Safety, Sensory, Skin Integrity  OT Balance, Behavior, Endurance, Motor, Safety, Perception  SLP Linguistic, Nutrition  TR         Basic ADL's: OT Grooming, Eating, Bathing, Dressing, Toileting     Advanced  ADL's: OT       Transfers: PT Bed Mobility, Bed to Chair, Car, Floor, Occupational psychologisturniture  OT Toilet, Research scientist (life sciences)Tub/Shower     Locomotion: PT Ambulation, Psychologist, prison and probation servicesWheelchair Mobility, Stairs     Additional Impairments: OT Fuctional Use of Upper Extremity  SLP Swallowing, Communication expression    TR      Anticipated Outcomes Item Anticipated Outcome  Self Feeding supervision  Swallowing  mod I with least restrictive diet    Basic self-care  supervision  Toileting  supervision   Bathroom Transfers supervision  Bowel/Bladder  Modified Independent  Transfers  supervision assist with LRAD   Locomotion  Ambulation with Supevision assist with LRAD at household level. [facility].   Communication  Min assist for verbal expression, mod I for multimodal communication  Cognition     Pain     Safety/Judgment  Modified independent   Therapy Plan: PT Intensity: Minimum of 1-2 x/day ,45 to 90 minutes PT Frequency: 5 out of 7 days PT Duration Estimated Length of Stay: 18-21days  OT Intensity: Minimum of 1-2 x/day, 45 to 90 minutes OT Frequency: 5 out  of 7 days OT Duration/Estimated Length of Stay: 18-21 days SLP Intensity: Minumum of 1-2 x/day, 30 to 90 minutes SLP Frequency: 3 to 5 out of 7 days SLP Duration/Estimated Length of Stay: 17-21 days     Team Interventions: Nursing Interventions Patient/Family Education, Bladder Management, Bowel Management, Medication Management, Skin Care/Wound Management, Disease Management/Prevention, Discharge Planning, Psychosocial Support  PT interventions Ambulation/gait training, Warden/rangerBalance/vestibular training, Community reintegration, Discharge planning, Disease management/prevention, DME/adaptive equipment instruction, Functional electrical stimulation, Functional mobility training, Neuromuscular re-education, Patient/family education, Pain management, Psychosocial support, Skin care/wound management, Splinting/orthotics, Stair training, Therapeutic Activities, Therapeutic Exercise, UE/LE Strength taining/ROM, UE/LE Coordination activities, Visual/perceptual remediation/compensation, Wheelchair propulsion/positioning  OT Interventions Warden/rangerBalance/vestibular training, Cognitive remediation/compensation, Community reintegration, Discharge planning, Functional mobility training, Psychosocial support, Therapeutic Activities, UE/LE Coordination activities, Patient/family education, DME/adaptive equipment instruction, Skin care/wound managment, UE/LE Strength taining/ROM, Wheelchair propulsion/positioning, Therapeutic Exercise, Self Care/advanced ADL retraining, Neuromuscular re-education  SLP Interventions Cueing hierarchy, Dysphagia/aspiration precaution training, Functional tasks, Internal/external aids, Multimodal communication approach, Patient/family education, Speech/Language facilitation  TR Interventions    SW/CM Interventions Discharge Planning, Psychosocial Support, Patient/Family Education   Barriers to Discharge MD  Medical stability and Home enviroment access/loayout  Nursing Other (comments) No barriers  to discharge assessed at this time  PT Decreased caregiver support    OT Other (comments) none known at this time  SLP      SW       Team Discharge Planning: Destination: PT-Home ,OT- Home , SLP-Home Projected Follow-up: PT-Home health PT, OT-  Outpatient OT, SLP-Outpatient SLP Projected Equipment Needs: PT-Rolling walker  with 5" wheels, Wheelchair cushion (measurements), Wheelchair (measurements), To be determined, OT- To be determined, SLP-None recommended by SLP Equipment Details: PT- , OT-  Patient/family involved in discharge planning: PT- Patient,  OT-Patient, SLP-Patient, Family member/caregiver  MD ELOS: 21-25d Medical Rehab Prognosis:  Good Assessment:  71 y.o.left handed male with history of Hep C-treated with Harvoni, glaucoma [facility] with left sided weakness, facial droop and slurred speech due to early changes of R-MCA territory infarct. He underwent CTA brain showed short segment near occlusion of proximal R-ICA with associated intraluminal thrombus --likely due to dissection and emergent large vessel occlusion with right M2 occlusion and evolving right frontal lobe infarct. He underwent cerebral angio with complete revascularization of occluded superior division of R-MCA and near complete revascularization of proximal R-ICA with stent assisted angioplasty. MRI brain done revealing right insula and frontal parietal junction acute infarct, several punctate foci of acute infarct in right frontal lobe, right parietal lobe, right occipital lobe, and left superior cerebellar hemisphere and stable right sylvian/posterior frontal convexity extra axial signal from extravasated contrast and/or SAH.   See Team Conference Notes for weekly updates to the plan of care

## 2016-12-02 NOTE — Progress Notes (Signed)
Physical Therapy Session Note  Patient Details  Name: Jason Nolan MRN: [id number] Date of Birth: [birth year]  Today's Date: 12/02/2016 PT Individual Time: 1300-1400 PT Individual Time Calculation (min): 60 min   Short Term Goals: Week 1:  PT Short Term Goal 1 (Week 1): Pt will transfer with mod assist consistently PT Short Term Goal 2 (Week 1): Pt will ambulate 3350ft with max assist and LRAD PT Short Term Goal 3 (Week 1): Pt will perform WC mobility x15250ft with supervision assist consistently  PT Short Term Goal 4 (Week 1): PT will perform bed mobility with supervision assist   Skilled Therapeutic Interventions/Progress Updates:     No c/o pain.  Session focus on transfers, gait with RW/handsplint, and NMR.   Pt propels w/c to therapy gym with R hemi technique, supervision but max multimodal cues to attend to L visual field.  Stand/pivot to mat on L with steady assist for balance.  Pt completes sit<>stand throughout session with steady assist for safety and min verbal cues for hand placement when using AD.  Minisquats x10 in standing focus on equal activation of LEs.  Gait training x45' with RW and mod/max assist for postural control, mod multimodal cues for upright posture and increase L step length.  nustep x10 minutes for reciprocal stepping pattern retraining, forced use, and activity tolerance at level 3, with verbal cues to attend to pre-determined stop time.  Pt returned to room at end of session in w/c, propelling initially with BLEs for reciprocal stepping pattern retraining, progress to R hemi technique with fatigue.  Left upright in w/c with call bell in reach to await OT.   Therapy Documentation Precautions:  Precautions Precautions: Fall Restrictions Weight Bearing Restrictions: No   See Function Navigator for Current Functional Status.   Therapy/Group: Individual Therapy  Ladora DanielCaitlin E Penven-Crew 12/02/2016, 1:59 PM

## 2016-12-03 ENCOUNTER — Inpatient Hospital Stay (HOSPITAL_COMMUNITY): Payer: Medicare Other

## 2016-12-03 ENCOUNTER — Inpatient Hospital Stay (HOSPITAL_COMMUNITY): Payer: Medicare Other | Admitting: Physical Therapy

## 2016-12-03 ENCOUNTER — Encounter (HOSPITAL_COMMUNITY): Payer: Self-pay

## 2016-12-03 ENCOUNTER — Inpatient Hospital Stay (HOSPITAL_COMMUNITY): Payer: Medicare Other | Admitting: Occupational Therapy

## 2016-12-03 ENCOUNTER — Inpatient Hospital Stay (HOSPITAL_COMMUNITY): Payer: Medicare Other | Admitting: Speech Pathology

## 2016-12-03 NOTE — Progress Notes (Addendum)
Physical Therapy Session Note  Patient Details  Name: Jason Nolan Inboden MRN: [id number] Date of Birth: 1945-12-05  Today's Date: 12/03/2016 PT Individual Time: 1530-1615 PT Individual Time Calculation (min): 45 min   Short Term Goals: Week 1:  PT Short Term Goal 1 (Week 1): Pt will transfer with mod assist consistently PT Short Term Goal 2 (Week 1): Pt will ambulate 1650ft with max assist and LRAD PT Short Term Goal 3 (Week 1): Pt will perform WC mobility x110250ft with supervision assist consistently  PT Short Term Goal 4 (Week 1): PT will perform bed mobility with supervision assist   Skilled Therapeutic Interventions/Progress Updates:     Pt seated in w/c upon PT arrival, agreeable to therapy tx and denies pain. Session focused on LE strengthening, balance and equal weightbearing. Pt performed x3 sit to stands with min assist, pt using R UE holding L UE on L knee to facilitate weightbearing through L LE. In standing pt performed 2 x 5 mini squats with min assist, facilitation for L LE weightbearing, verbal cues for L knee extension when fully standing. Pt transferred from w/c to kinetron, stand pivot transfer with hand held assist. Using the kinetron in standing pt focused on finding equal weightbearing through LE's, maintaining balance without UE support and maintaining upright posture with hip and trunk extension using the mirror for biofeedback, x 3 trials. Pt propelled w/c 150 ft back to room using R hemi technique, verbal cues to attend to L side of hallway. Pt left seated in w/c in room with family present.   Therapy Documentation Precautions:  Precautions Precautions: Fall Restrictions Weight Bearing Restrictions: No    See Function Navigator for Current Functional Status.   Therapy/Group: Individual Therapy  Cresenciano GenreEmily van Schagen, PT, DPT 12/03/2016, 5:17 PM

## 2016-12-03 NOTE — Progress Notes (Signed)
Speech Language Pathology Daily Session Note  Patient Details  Name: Jason Nolan MRN: [id number] Date of Birth: 03/05/1946  Today's Date: 12/03/2016 SLP Individual Time: 1330-1400 SLP Individual Time Calculation (min): 30 min  Short Term Goals: Week 1: SLP Short Term Goal 1 (Week 1): Pt will consume dys 3 textures and thin liquids with mod I use of swallowing precautions over 2 consecutive sessions.  SLP Short Term Goal 2 (Week 1): Pt will consume trials of regular textures with mod I use of swallowing precautions over 2 consecutive sessions prior to advancement.   SLP Short Term Goal 3 (Week 1): Pt will utilize overarticulation to achieve intelligibility at the word level with mod assist multimodal cues.   SLP Short Term Goal 4 (Week 1): Pt will communicate his immediate needs and wants via multimodal means with supervision verbal cues.   SLP Short Term Goal 5 (Week 1): Pt will recognize and correct verbal errors at the word level with mod assist verbal cues.    Skilled Therapeutic Interventions: Skilled treatment session focused on speech goals. SLP facilitated session by providing Mod A verbal cues for patient to self-monitor and correct errors at the word and phrase level and for utilization of speech intelligibility strategies during a verbal description task. Patient demonstrates the most difficulty with phonemes /g/, /k/, and /l/ in all positions. Patient left upright in wheelchair with all needs within reach. Continue with current plan of care.       Function:  Cognition Comprehension Comprehension assist level: Follows basic conversation/direction with no assist  Expression   Expression assist level: Expresses basic 50 - 74% of the time/requires cueing 25 - 49% of the time. Needs to repeat parts of sentences.  Social Interaction Social Interaction assist level: Interacts appropriately 90% of the time - Needs monitoring or encouragement for participation or interaction.  Problem  Solving Problem solving assist level: Solves basic problems with no assist  Memory Memory assist level: Recognizes or recalls 90% of the time/requires cueing < 10% of the time    Pain Pain Assessment Pain Assessment: No/denies pain Pain Score: 0-No pain  Therapy/Group: Individual Therapy  Tywana Robotham 12/03/2016, 2:14 PM

## 2016-12-03 NOTE — Progress Notes (Signed)
Physical Therapy Session Note  Patient Details  Name: Jason Nolan MRN: [id number] Date of Birth: 10/27/1945  Today's Date: 12/03/2016 PT Individual Time: 1400-1500 PT Individual Time Calculation (min): 60 min   Short Term Goals: Week 1:  PT Short Term Goal 1 (Week 1): Pt will transfer with mod assist consistently PT Short Term Goal 2 (Week 1): Pt will ambulate 5650ft with max assist and LRAD PT Short Term Goal 3 (Week 1): Pt will perform WC mobility x16350ft with supervision assist consistently  PT Short Term Goal 4 (Week 1): PT will perform bed mobility with supervision assist   Skilled Therapeutic Interventions/Progress Updates:    no c/o pain. PT treatment session focused on dynamic standing balance and L LE strengthening. Pt sitting in w/c upon arrival, agreeable to PT. Pt performed w/c mobility 14750ft using R hemi-technique with supervision for safety. Stand pivot transfer with min assist for steadying throughout session. Sit to stand with min guard assist for steadying and safety throughout. Standing L LE and R LE step ups progressing to alternating R/L LE step up on 4 inch stair x3 each to fatigue, seated rest breaks between, with min assist for balance. Pt noted to have L LE knee hyperextension during R LE step ups and heel lift inserted into L shoe with decrease hyperextension noted when task resumed while ensuring L hip in neutral alignment. Sit to supine with supervision for safety. Bridges with R LE extended to increase use of L LE 2x15 reps with a 5 second hold and rest break between sets with verbal cues for L hip neutral. PT replaced L hand splint with size Large. Pt ambulated 2725ft with RW with L hand splint with moderate assist for balance and verbal cues for L LE clearance during swing. Pt lateral stepping at hall rail 3430ft with B UE support and min assist for balance and verbal cues for increased L step length. PT rolled pt to room in w/c. PT held urinal for pt to have continent  bladder void. Pt left in w/c with call bell in reach.      Therapy Documentation Precautions:  Precautions Precautions: Fall Restrictions Weight Bearing Restrictions: No  See Function Navigator for Current Functional Status.   Therapy/Group: Individual Therapy  Jason Nolan 12/03/2016, 4:22 PM

## 2016-12-03 NOTE — Plan of Care (Signed)
Problem: RH BOWEL ELIMINATION Goal: RH STG MANAGE BOWEL WITH ASSISTANCE STG Manage Bowel with Modified Independence.   Outcome: Progressing Moderate assistance with 2 helpers  Problem: RH SKIN INTEGRITY Goal: RH STG SKIN FREE OF INFECTION/BREAKDOWN Pt will remain free of infection and skin breakdown with Modified Independence  Outcome: Progressing No skin issues noted  Problem: RH SAFETY Goal: RH STG ADHERE TO SAFETY PRECAUTIONS W/ASSISTANCE/DEVICE STG Adhere to Safety Precautions With Assistance/Device.  Outcome: Progressing Patient is aware of safety precautions and fall prevention  Problem: RH PAIN MANAGEMENT Goal: RH STG PAIN MANAGED AT OR BELOW PT'S PAIN GOAL Pt's pain will be < or =2 with Min Assistance  Outcome: Progressing Denies pain

## 2016-12-03 NOTE — Progress Notes (Signed)
Occupational Therapy Session Note  Patient Details  Name: Jason Nolan MRN: [id number] Date of Birth: 1945/12/10  Today's Date: 12/03/2016 OT Individual Time: 0901-1000 OT Individual Time Calculation (min): 59 min   Short Term Goals: Week 1:  OT Short Term Goal 1 (Week 1): Pt will perform shower transfer with mod A. OT Short Term Goal 2 (Week 1): Pt will perform UB dressing with min A in order to decrease level of assist with self care. OT Short Term Goal 3 (Week 1): Pt will display min A standing balance during LB clothing management.  Skilled Therapeutic Interventions/Progress Updates:    Pt greeted sitting [name] awaiting therapist. Pt completed stand-pivot transfer to R with min A and slight posterior LOB requiring min A to correct. Stand-pivot into shower to weaker L side with min A stand and mod A to facilitate pivot. Incorporated L NMR techniques within bathing, with improved L bicep/tricep activation to push and pull wash cloth. Sit<.>stand with min A, then mod A for standing balance while pt reaches with R UE to wash buttocks. Pt with good recall of hemi-dressing techniques, but requires min A to position L LE into figure 4 position to assist with threading pants. Sit<>stand at the sink with min A with hand-over hand to place L UE onto counter for stabilization and weight bearing. Pt then completed L NMR focsed on distal activation . Pt able to actively pronate and supinate L forearm with repetition, then was able to begin to flex fingers closed. Pt left seated in wc with L UE supported on lap tray and needs met.  Therapy Documentation Precautions:  Precautions Precautions: Fall Restrictions Weight Bearing Restrictions: No Pain: Pain Assessment Pain Assessment: No/denies pain Pain Score: 0-No pain See Function Navigator for Current Functional Status.   Therapy/Group: Individual Therapy  Valma Cava 12/03/2016, 12:37 PM

## 2016-12-03 NOTE — Progress Notes (Signed)
[facility]ILIT[facility]     PROGRESS NOTE  Subjective/Complaints:  Discussed motor recovery post CVA Denies shoulder pain ROS: Denies CP, SOB, N/V/D.  Objective: Vital Signs: Blood pressure 139/69, pulse 69, temperature 99.3 F (37.4 C), temperature source Oral, resp. rate 18, height 5\' 10"  (1.778 m), weight 92.3 kg (203 lb 7.8 oz), SpO2 94 %. No results found. No results for input(s): WBC, HGB, HCT, PLT in the last 72 hours. No results for input(s): NA, K, CL, GLUCOSE, BUN, CREATININE, CALCIUM in the last 72 hours.  Invalid input(s): CO CBG (last 3)  No results for input(s): GLUCAP in the last 72 hours.  Wt Readings from Last 3 Encounters:  11/29/16 92.3 kg (203 lb 7.8 oz)  11/25/16 96.9 kg (213 lb 10 oz)  11/25/16 90.7 kg (200 lb)    Physical Exam:  BP 139/69 (BP Location: Right Arm)   Pulse 69   Temp 99.3 F (37.4 C) (Oral)   Resp 18   Ht 5\' 10"  (1.778 m)   Wt 92.3 kg (203 lb 7.8 oz)   SpO2 94%   BMI 29.20 kg/m  Constitutional: He appears well-developedand well-nourished. No distress.  HENT: Normocephalicand atraumatic.  Eyes: EOMI. No discharge. Cardiovascular: RRR. No murmurheard. Respiratory: Effort normal breath sounds normal. GI: Soft. Bowel sounds are normal.  Musculoskeletal: He exhibits edema(LUE), no tenderness.  Neurological: He is alertand oriented Dysarthria.  Fair insight and awareness.  Left facial weakness  LUE: 0/5 proximal to distal  RUE/RLE: 4/5 proximal to distal LLE: 2/5 HF, KE, ADF/PF Skin: Skin is warmand dry. He is not diaphoretic.  Psychiatric:   Assessment/Plan: 1. Functional deficits secondary to embolic right MCA infarct which require 3+ hours per day of interdisciplinary therapy in a comprehensive inpatient rehab setting. Physiatrist is providing close team supervision and 24 hour management of active medical problems listed below. Physiatrist and rehab team continue to assess barriers to  discharge/monitor patient progress toward functional and medical goals.  Function:  Bathing Bathing position   Position: Shower  Bathing parts Body parts bathed by patient: Chest, Abdomen, Front perineal area, Right upper leg, Left upper leg Body parts bathed by helper: Right arm, Left arm, Buttocks, Right lower leg, Left lower leg, Back  Bathing assist Assist Level: Touching or steadying assistance(Pt > 75%)      Upper Body Dressing/Undressing Upper body dressing   What is the patient wearing?: Pull over shirt/dress     Pull over shirt/dress - Perfomed by patient: Put head through opening, Thread/unthread right sleeve Pull over shirt/dress - Perfomed by helper: Pull shirt over trunk, Thread/unthread left sleeve        Upper body assist Assist Level: Touching or steadying assistance(Pt > 75%)      Lower Body Dressing/Undressing Lower body dressing   What is the patient wearing?: Underwear, Pants, Non-skid slipper socks Underwear - Performed by patient: Thread/unthread right underwear leg Underwear - Performed by helper: Thread/unthread left underwear leg, Pull underwear up/down Pants- Performed by patient: Thread/unthread right pants leg Pants- Performed by helper: Thread/unthread left pants leg, Pull pants up/down   Non-skid slipper socks- Performed by helper: Don/doff left sock, Don/doff right sock                  Lower body assist Assist for lower body dressing: Touching or steadying assistance (Pt > 75%) (patient did 2/8, 25%, max assist level)      Toileting Toileting     Toileting steps completed by helper:  Adjust clothing prior to toileting, Performs perineal hygiene, Adjust clothing after toileting (per [place], [place])    Toileting assist     Transfers Chair/bed transfer   Chair/bed transfer method: Squat pivot, Stand pivot Chair/bed transfer assist level: Touching or steadying assistance (Pt > 75%) Chair/bed transfer assistive device: Armrests      Locomotion Ambulation     Max distance: 45 Assist level: Moderate assist (Pt 50 - 74%)   Wheelchair   Type: Manual Max wheelchair distance: 17550ft Assist Level: Supervision or verbal cues  Cognition Comprehension Comprehension assist level: Follows basic conversation/direction with no assist  Expression Expression assist level: Expresses basic 50 - 74% of the time/requires cueing 25 - 49% of the time. Needs to repeat parts of sentences.  Social Interaction Social Interaction assist level: Interacts appropriately 90% of the time - Needs monitoring or encouragement for participation or interaction.  Problem Solving Problem solving assist level: Solves basic 75 - 89% of the time/requires cueing 10 - 24% of the time  Memory Memory assist level: Recognizes or recalls 75 - 89% of the time/requires cueing 10 - 24% of the time    Medical Problem List and Plan: 1. Left hemiparesis and speech/swallowing difficultiessecondary to embolic right MCA infarct  Cont CIR PT, OT, SLP-Team conf in am    2. DVT Prophylaxis/Anticoagulation: Pharmaceutical: Lovenox,Brillinta 3. Pain Management: tylenol prn  4. Mood: LCSW to follow for evaluation and support. Team to provide ego support--showing signs of adjustment reaction. 5. Neuropsych: This patient appears to be capable of making decisions on hisown behalf. 6. Skin/Wound Care: routine pressure relief measures.  7. Fluids/Electrolytes/Nutrition: Monitor I/Os  BMP within acceptable range on 7/28 8. Glaucoma: Continue Xalatan as at home. 9. ABLA:   Hb 12.2 on 7/28  Cont to monitor 10. Elevated triglycerides: Now on Lipitor.  11. HTN: Controlled 7/31   Vitals:   12/02/16 1404 12/03/16 0450  BP: (!) 141/71 139/69  Pulse: 95 69  Resp: 20 18  Temp: 98.3 F (36.8 C) 99.3 F (37.4 C)   12. Dysphagia: pt on D3 diet per acute SLP recs: pt very dysarthric with poor oro-motor control. Aspiration precautions 13. Hypoalbuminemia  Supplement  initiated 7/28  LOS (Days) 4 A FACE TO FACE EVALUATION WAS PERFORMED  Durant Scibilia E 12/03/2016 7:59 AM

## 2016-12-04 ENCOUNTER — Inpatient Hospital Stay (HOSPITAL_COMMUNITY): Payer: Medicare Other | Admitting: Speech Pathology

## 2016-12-04 ENCOUNTER — Inpatient Hospital Stay (HOSPITAL_COMMUNITY): Payer: Medicare Other | Admitting: Physical Therapy

## 2016-12-04 ENCOUNTER — Ambulatory Visit (HOSPITAL_COMMUNITY): Payer: Medicare Other | Admitting: Physical Therapy

## 2016-12-04 ENCOUNTER — Inpatient Hospital Stay (HOSPITAL_COMMUNITY): Payer: Medicare Other | Admitting: Occupational Therapy

## 2016-12-04 NOTE — Progress Notes (Signed)
Occupational Therapy Session Note  Patient Details  Name: Jason CroakJames Nolan MRN: [id number] Date of Birth: [birth year]  Today's Date: 12/04/2016 OT Individual Time: 0900-1000 OT Individual Time Calculation (min): 60 min   Short Term Goals: Week 1:  OT Short Term Goal 1 (Week 1): Pt will perform shower transfer with mod A. OT Short Term Goal 2 (Week 1): Pt will perform UB dressing with min A in order to decrease level of assist with self care. OT Short Term Goal 3 (Week 1): Pt will display min A standing balance during LB clothing management.  Skilled Therapeutic Interventions/Progress Updates:    Pt transferred OOB with supervision, completed stand-pivot to wc on R side with min guard A. Pt propelled wc to dresser with min A using B LE's and increased time to step with LLE. VC for wc positioning to access lower drawers to collect clothing. Pt completed stand-pivot to tub bench with min guard. Incorporated L NMR techniques during bathing.  L wash mit donned and OT provided distal support while pt actively pushed and pulled to wash. Pt able to utilize L UE as a stabilizer to open containers and demonstrated slight active finger flexion.UB and LB dressing with min A and good recall of hemi-techniques. Incorporated forced use of L UE with hand-ovre hand A to pull up pants. L UE NMR in standing-facilitated weight bearing with weight shifting onto L UE at sink. Pt returned to wc and left at the sink to finish toothbrushing task at end of session.   Therapy Documentation Precautions:  Precautions Precautions: Fall Restrictions Weight Bearing Restrictions: No Pain: Pain Assessment Pain Assessment: No/denies pain  See Function Navigator for Current Functional Status.   Therapy/Group: Individual Therapy  Mal Amabilelisabeth S Tijuana Scheidegger 12/04/2016, 12:37 PM

## 2016-12-04 NOTE — Progress Notes (Signed)
[facility] Individual Statement of Services  Patient Name:  Jason Nolan  Date:  12/04/2016  Welcome to the [facility].  Our goal is to provide you with an individualized program based on your diagnosis and situation, designed to meet your specific needs.  With this comprehensive rehabilitation program, you will be expected to participate in at least 3 hours of rehabilitation therapies Monday-Friday, with modified therapy programming on the weekends.  Your rehabilitation program will include the following services:  Physical Therapy (PT), Occupational Therapy (OT), Speech Therapy (ST), 24 hour per day rehabilitation nursing, Case Management (Social Worker), Rehabilitation Medicine, Nutrition Services and Pharmacy Services  Weekly team conferences will be held on Wednesdays to discuss your progress.  Your Social Worker will talk with you frequently to get your input and to update you on team discussions.  Team conferences with you and your family in attendance may also be held.  Expected length of stay:  3 weeks  Overall anticipated outcome:  Supervision with minimal assistance for stairs  Depending on your progress and recovery, your program may change. Your Social Worker will coordinate services and will keep you informed of any changes. Your Social Worker's name and contact numbers are listed  below.  The following services may also be recommended but are not provided by the [facility]:   Driving Evaluations  Home Health Rehabiltiation Services  Outpatient Rehabilitation Services   Arrangements will be made to provide these services after discharge if needed.  Arrangements include referral to agencies that provide these services.  Your insurance has been verified to be:  Medicare and Community education officer[employer] Your primary doctor is:  Dr. Nicki Reaperegina Baity  Pertinent information will be shared with your doctor and your insurance company.  Social  Worker:  Staci AcostaJenny Robinn Overholt, LCSW  [phone] or [phone]) [phone]  Information discussed with and copy given to patient by: Elvera LennoxPrevatt, Elayah Klooster Capps, 12/04/2016, 12:28 PM

## 2016-12-04 NOTE — Progress Notes (Signed)
[facility] MEDICINE & REHABILITATION     PROGRESS NOTE  Subjective/Complaints:  Took a sleeping med last noc  No issues in therapy , was able to touch fingers to mouth in sitting  ROS: Denies CP, SOB, N/V/D.  Objective: Vital Signs: Blood pressure 108/66, pulse 66, temperature 98.1 F (36.7 C), temperature source Oral, resp. rate 18, height 5' 10"  (1.778 m), weight 87 kg (191 lb 12.3 oz), SpO2 99 %. No results found. No results for input(s): WBC, HGB, HCT, PLT in the last 72 hours. No results for input(s): NA, K, CL, GLUCOSE, BUN, CREATININE, CALCIUM in the last 72 hours.  Invalid input(s): CO CBG (last 3)  No results for input(s): GLUCAP in the last 72 hours.  Wt Readings from Last 3 Encounters:  12/04/16 87 kg (191 lb 12.3 oz)  11/25/16 96.9 kg (213 lb 10 oz)  11/25/16 90.7 kg (200 lb)    Physical Exam:  BP 108/66 (BP Location: Right Leg)   Pulse 66   Temp 98.1 F (36.7 C) (Oral)   Resp 18   Ht 5' 10"  (1.778 m)   Wt 87 kg (191 lb 12.3 oz)   SpO2 99%   BMI 27.52 kg/m  Constitutional: He appears well-developedand well-nourished. No distress.  HENT: Normocephalicand atraumatic.  Eyes: EOMI. No discharge. Cardiovascular: RRR. No murmurheard. Respiratory: Effort normal breath sounds normal. GI: Soft. Bowel sounds are normal.  Musculoskeletal: He exhibits edema(LUE), no tenderness.  Neurological: He is alertand oriented Dysarthria.  Fair insight and awareness.  Left facial weakness  LUE: 0/5 proximal to distal  RUE/RLE: 4/5 proximal to distal LLE: 2/5 HF, KE, ADF/PF Skin: Skin is warmand dry. He is not diaphoretic.  Psychiatric:   Assessment/Plan: 1. Functional deficits secondary to embolic right MCA infarct which require 3+ hours per day of interdisciplinary therapy in a comprehensive inpatient rehab setting. Physiatrist is providing close team supervision and 24 hour management of active medical problems listed below. Physiatrist and rehab team  continue to assess barriers to discharge/monitor patient progress toward functional and medical goals.  Function:  Bathing Bathing position   Position: Shower  Bathing parts Body parts bathed by patient: Chest, Abdomen, Front perineal area, Right upper leg, Left upper leg Body parts bathed by helper: Right arm, Left arm, Buttocks, Right lower leg, Left lower leg, Back  Bathing assist Assist Level: Touching or steadying assistance(Pt > 75%)      Upper Body Dressing/Undressing Upper body dressing   What is the patient wearing?: Pull over shirt/dress     Pull over shirt/dress - Perfomed by patient: Put head through opening, Thread/unthread right sleeve Pull over shirt/dress - Perfomed by helper: Pull shirt over trunk, Thread/unthread left sleeve        Upper body assist Assist Level: Touching or steadying assistance(Pt > 75%)      Lower Body Dressing/Undressing Lower body dressing   What is the patient wearing?: Underwear, Pants, Non-skid slipper socks Underwear - Performed by patient: Thread/unthread right underwear leg Underwear - Performed by helper: Thread/unthread left underwear leg, Pull underwear up/down Pants- Performed by patient: Thread/unthread right pants leg Pants- Performed by helper: Thread/unthread left pants leg, Pull pants up/down   Non-skid slipper socks- Performed by helper: Don/doff left sock, Don/doff right sock                  Lower body assist Assist for lower body dressing: Touching or steadying assistance (Pt > 75%) (patient did 2/8, 25%, max assist level)  Toileting Toileting     Toileting steps completed by helper: Adjust clothing prior to toileting    Toileting assist     Transfers Chair/bed transfer   Chair/bed transfer method: Stand pivot Chair/bed transfer assist level: Touching or steadying assistance (Pt > 75%) Chair/bed transfer assistive device: Armrests     Locomotion Ambulation     Max distance: 68f Assist level:  Moderate assist (Pt 50 - 74%)   Wheelchair   Type: Manual Max wheelchair distance: 1578fAssist Level: Supervision or verbal cues  Cognition Comprehension Comprehension assist level: Follows basic conversation/direction with no assist  Expression Expression assist level: Expresses basic 50 - 74% of the time/requires cueing 25 - 49% of the time. Needs to repeat parts of sentences.  Social Interaction Social Interaction assist level: Interacts appropriately 90% of the time - Needs monitoring or encouragement for participation or interaction.  Problem Solving Problem solving assist level: Solves basic problems with no assist  Memory Memory assist level: Recognizes or recalls 90% of the time/requires cueing < 10% of the time    Medical Problem List and Plan: 1. Left hemiparesis and speech/swallowing difficultiessecondary to embolic right MCA infarct  Cont CIR PT, OT, SLP-Team conference today please see physician documentation under team conference tab, met with team face-to-face to discuss problems,progress, and goals. Formulized individual treatment plan based on medical history, underlying problem and comorbidities.   2. DVT Prophylaxis/Anticoagulation: Pharmaceutical: Lovenox,Brillinta 3. Pain Management: tylenol prn  4. Mood: LCSW to follow for evaluation and support. Team to provide ego support--showing signs of adjustment reaction. 5. Neuropsych: This patient appears to be capable of making decisions on hisown behalf. 6. Skin/Wound Care: routine pressure relief measures.  7. Fluids/Electrolytes/Nutrition: Monitor I/Os  BMP within acceptable range on 7/28 8. Glaucoma: Continue Xalatan as at home. 9. ABLA:   Hb 12.2 on 7/28  Cont to monitor 10. Elevated triglycerides: Now on Lipitor.  11. HTN: Controlled 8/1 still some lability  Vitals:   12/03/16 1517 12/04/16 0525  BP: (!) 155/75 108/66  Pulse: 85 66  Resp: 18 18  Temp: 98.8 F (37.1 C) 98.1 F (36.7 C)   12.  Dysphagia: pt on D3 diet per acute SLP recs: pt very dysarthric with poor oro-motor control. Aspiration precautions 13. Hypoalbuminemia  Supplement initiated 7/28  LOS (Days) 5 A FACE TO FACE EVALUATION WAS PERFORMED  KICharlett Blake/05/2016 7:33 AM

## 2016-12-04 NOTE — Progress Notes (Signed)
Speech Language Pathology Daily Session Note  Patient Details  Name: Terrilee CroakJames Mester MRN: [id number] Date of Birth: [birth year]  Today's Date: 12/04/2016  Session 1: SLP Individual Time: 7829-56211145-1210 SLP Individual Time Calculation (min): 25 min  Session 2: SLP Individual Time: 1300-1330 SLP Individual Time Calculation (min): 30 min  Short Term Goals: Week 1: SLP Short Term Goal 1 (Week 1): Pt will consume dys 3 textures and thin liquids with mod I use of swallowing precautions over 2 consecutive sessions.  SLP Short Term Goal 2 (Week 1): Pt will consume trials of regular textures with mod I use of swallowing precautions over 2 consecutive sessions prior to advancement.   SLP Short Term Goal 3 (Week 1): Pt will utilize overarticulation to achieve intelligibility at the word level with mod assist multimodal cues.   SLP Short Term Goal 4 (Week 1): Pt will communicate his immediate needs and wants via multimodal means with supervision verbal cues.   SLP Short Term Goal 5 (Week 1): Pt will recognize and correct verbal errors at the word level with mod assist verbal cues.    Skilled Therapeutic Interventions:  Session 1: Skilled treatment session focused on dysphagia and speech goals. SLP facilitated session by providing skilled observation with lunch meal of Dys. 3 textures and thin liquids. Patient consumed meal without overt s/s of aspiration and required intermittent supervision verbal cues to self-monitor and correct left anterior spillage. Patient also required Mod A verbal cues to self-monitor and correct errors and for utilization of speech intelligibility strategies at the phrase and sentence level to maximize intelligibility to ~50-75%. Patient left upright in wheelchair with all needs within reach. Continue with current plan of care.   Session 2: Skilled treatment session focused on dysphagia and speech goals. SLP facilitated session by providing skilled observation with trials of regular  textures. Patient demonstrated efficient mastication with minimal oral residue and supervision verbal cues needed to self-monitor and correct left anterior spillage. Patient consumed trials without overt s/s of aspiration. Recommend trial tray prior to upgrade. Patient also participated in a verbal description task at the phrase level and required Mod A verbal cues to self-monitor and correct errors to achieve 75% intelligibility. Patient left upright in wheelchair with all needs within reach. Continue with current plan of care.    Function:  Eating Eating   Modified Consistency Diet: Yes Eating Assist Level: More than reasonable amount of time;Set up assist for   Eating Set Up Assist For: Opening containers       Cognition Comprehension Comprehension assist level: Follows basic conversation/direction with no assist  Expression   Expression assist level: Expresses basic 50 - 74% of the time/requires cueing 25 - 49% of the time. Needs to repeat parts of sentences.  Social Interaction Social Interaction assist level: Interacts appropriately 90% of the time - Needs monitoring or encouragement for participation or interaction.  Problem Solving Problem solving assist level: Solves basic problems with no assist  Memory Memory assist level: Recognizes or recalls 90% of the time/requires cueing < 10% of the time    Pain Pain Assessment Pain Assessment: No/denies pain  Therapy/Group: Individual Therapy  Barbi Kumagai 12/04/2016, 3:17 PM

## 2016-12-04 NOTE — Progress Notes (Signed)
Physical Therapy Session Note  Patient Details  Name: Jason Nolan MRN: [id number] Date of Birth: 1945-08-22  Today's Date: 12/04/2016 PT Individual Time: 1600-1630 PT Individual Time Calculation (min): 30 min   Short Term Goals: Week 1:  PT Short Term Goal 1 (Week 1): Pt will transfer with mod assist consistently PT Short Term Goal 2 (Week 1): Pt will ambulate 2050ft with max assist and LRAD PT Short Term Goal 3 (Week 1): Pt will perform WC mobility x12850ft with supervision assist consistently  PT Short Term Goal 4 (Week 1): PT will perform bed mobility with supervision assist   Skilled Therapeutic Interventions/Progress Updates:    Tx 1: no c/o pain. PT treatment session focused on dynamic standing balance, L LE NMR during standing activities, and L UE NMR with sitting balance. Pt sitting in w/c upon arrival, agreeable to PT treatment session. PT rolled pt to/from gym in w/c for energy conservation and time. Stand pivot transfer w/c to mat with min A for steadying and verbal cues for LE sequencing. Standing balance playing horseshoes x3 progressing to narrow BOS with min assist for steadying. Stepping strategy towards discs on ground x1 with mod assist for balance and one L LOB requiring controlled decent to lower surface. RN and PA notified, PA assess pt with no change in orders and pt not reporting any symptoms. Vitals assessed: BP 166/75, HR 87bpm, SpO2 100%. Recreational therapist joined for remained of session. Standing kinetron with R UE progressing to no UE support with min assist for balance working on reciprocal stepping and dynamic standing balance. Sitting edge of mat with supervision for safety performing UE task with hand over hand assist to grip device and pt able to perform shoulder abduction. Pt returned to room in w/c with QRB in place and call bell in reach.   Tx2: no c/o pain. PT treatment session focused on reciprocal stepping pattern, trunk rotation, L UE forced use, and  carryover with gait. Pt sitting in w/c upon arrival with family/friends present, pt agreeable to PT treatment session. PT rolled pt to gym for energy conservation and time. Stand pivot transfer w/c to Nustep min A for steadying. Nustep 8 minutes with L UE hand split and verbal cues for continuous reciprocal pattern. Ambulate 16550ft with RW and mod assist for balance with verbal and tactile cues for weight shifting and L LE step length and placement. Pt left sitting in w/c with QRB in place and call bell in reach with family/friends present.   Therapy Documentation Precautions:  Precautions Precautions: Fall Restrictions Weight Bearing Restrictions: No   See Function Navigator for Current Functional Status.   Therapy/Group: Individual Therapy  Jason Nolan 12/04/2016, 5:22 PM

## 2016-12-04 NOTE — Evaluation (Signed)
Recreational Therapy Assessment and Plan  Patient Details  Name: Jason Nolan MRN: [id number] Date of Birth: November 21, 1945 Today's Date: 12/04/2016  Rehab Potential: Good ELOS: 2 weeks   Assessment Problem List:      Patient Active Problem List   Diagnosis Date Noted  . Hypoalbuminemia due to protein-calorie malnutrition ([place])   . Dysarthria, post-stroke   . Benign essential HTN   . Acute blood loss anemia   . Acute right MCA stroke ([place]) 11/29/2016  . Dysphagia, oropharyngeal 11/29/2016  . Essential hypertension 11/29/2016  . Cerebral embolism with cerebral infarction 11/25/2016  . 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 Leukopenia 03/31/2016  . Hepatitis C 07/22/2013    Past Medical History:      Past Medical History:  Diagnosis Date  . Glaucoma   . Hepatitis C    Past Surgical History:       Past Surgical History:  Procedure Laterality Date  . IR ANGIO INTRA EXTRACRAN SEL COM CAROTID INNOMINATE UNI L MOD SED  11/25/2016  . IR ANGIO VERTEBRAL SEL SUBCLAVIAN INNOMINATE UNI R MOD SED  11/25/2016  . IR ANGIO VERTEBRAL SEL VERTEBRAL UNI L MOD SED  11/25/2016  . IR INTRAVSC STENT CERV CAROTID W/O EMB-PROT MOD SED INC ANGIO  11/25/2016  . IR PERCUTANEOUS ART THROMBECTOMY/INFUSION INTRACRANIAL INC DIAG ANGIO  11/25/2016  . RADIOLOGY WITH ANESTHESIA N/A 11/25/2016   Procedure: RADIOLOGY WITH ANESTHESIA;  Surgeon: Radiologist, Medication, MD;  Location: [place];  Service: Radiology;  Laterality: N/A;    Assessment & Plan Clinical Impression: Patient is a 71 y.o.left handed male with history of Hep C-treated with Harv[place], glaucoma who was admitted via [facility] on 7/23 with left sided weakness, facial droop and slurred speech due to early changes of R-MCA territory infarct. He underwent CTA brain showed short segment near occlusion of proximal R-ICA with associated  intraluminal thrombus --likely due to dissection and emergent large vessel occlusion with right M2 occlusion and evolving right frontal lobe infarct. He underwent cerebral angio with complete revascularization of occluded superior division of R-MCA and near complete revascularization of proximal R-ICA with stent assisted angioplasty. MRI brain done revealing right insula and frontal parietal junction acute infarct, several punctate foci of acute infarct in right frontal lobe, right parietal lobe, right occipital lobe, and left superior cerebellar hemisphere and stable right sylvian/posterior frontal convexity extra axial signal from extravasated contrast and/or SAH.  He tolerated extubation on 7/25. 2 D echo with EF 60-65% with no wall abnormality. Carotid dopplers showed 40-50% right proximal ICA stenosis, L- 1-39% stenosis and elevated velocities in B-VA. Stroke felt to be embolic in setting of ruptured plaque R-ICA and patient on ASA and brillinta due to stent. MBS done and patient started on dysphagia 3, thin liquids. Patient with resultant left sided weakness, expressive deficits with aphonia and dysphagia.  Patient transferred to CIR on 11/29/2016.   Pt presents with decreased activity tolerance, decreased functional mobility, decreased balance, decreased coordination, dysarthria Limiting pt's independence with leisure/community pursuits.  Leisure History/Participation Premorbid leisure interest/current participation: Medical laboratory scientific officer - Doctor, hospital - Building control surveyor - Travel (Comment) (gym) Psychosocial / Spiritual Social interaction - Mood/Behavior: Cooperative Academic librarian Appropriate for Education?: Yes Recreational Therapy Orientation Orientation -Reviewed with patient: Available activity resources Strengths/Weaknesses Patient Strengths/Abilities: Willingness to participate;Active premorbidly Patient weaknesses: Physical limitations TR Patient demonstrates  impairments in the following area(s): Edema;Endurance;Motor;Safety  Plan  Rec Therapy Plan Is patient appropriate for Therapeutic Recreation?: Yes Rehab Potential: Good Treatment times per week: Min 1 TR session/group >25 minutes during LOS Estimated Length of Stay: 2 weeks TR Treatment/Interventions: Adaptive equipment instruction;1:1 session;Balance/vestibular training;Functional mobility training;Community reintegration;Group participation (Comment);Patient/family education;Therapeutic activities;Recreation/leisure participation;Therapeutic exercise;UE/LE Coordination activities;Wheelchair propulsion/positioning  Recommendations for other services: None   Discharge Criteria: Patient will be discharged from TR if patient refuses treatment 3 consecutive times without medical reason.  If treatment goals not met, if there is a change in medical status, if patient makes no progress towards goals or if patient is discharged from hospital.  The above assessment, treatment plan, treatment alternatives and goals were discussed and mutually agreed upon: by patient  [place] 12/04/2016, 3:44 PM

## 2016-12-05 ENCOUNTER — Inpatient Hospital Stay (HOSPITAL_COMMUNITY): Payer: Medicare Other | Admitting: Occupational Therapy

## 2016-12-05 ENCOUNTER — Encounter (HOSPITAL_COMMUNITY): Payer: Self-pay

## 2016-12-05 ENCOUNTER — Inpatient Hospital Stay (HOSPITAL_COMMUNITY): Payer: Medicare Other | Admitting: Speech Pathology

## 2016-12-05 ENCOUNTER — Inpatient Hospital Stay (HOSPITAL_COMMUNITY): Payer: Medicare Other | Admitting: Physical Therapy

## 2016-12-05 LAB — CBC
HCT: 38.1 % — ABNORMAL LOW (ref 39.0–52.0)
HEMOGLOBIN: 12.5 g/dL — AB (ref 13.0–17.0)
MCH: 30.3 pg (ref 26.0–34.0)
MCHC: 32.8 g/dL (ref 30.0–36.0)
MCV: 92.5 fL (ref 78.0–100.0)
Platelets: 233 10*3/uL (ref 150–400)
RBC: 4.12 MIL/uL — AB (ref 4.22–5.81)
RDW: 13.3 % (ref 11.5–15.5)
WBC: 4.9 10*3/uL (ref 4.0–10.5)

## 2016-12-05 LAB — BASIC METABOLIC PANEL
Anion gap: 6 (ref 5–15)
BUN: 15 mg/dL (ref 6–20)
CALCIUM: 8.6 mg/dL — AB (ref 8.9–10.3)
CO2: 26 mmol/L (ref 22–32)
CREATININE: 0.97 mg/dL (ref 0.61–1.24)
Chloride: 106 mmol/L (ref 101–111)
GFR calc Af Amer: >60 mL/min (ref >60)
Glucose, Bld: 98 mg/dL (ref 65–99)
Potassium: 3.5 mmol/L (ref 3.5–5.1)
SODIUM: 138 mmol/L (ref 135–145)

## 2016-12-05 NOTE — Progress Notes (Signed)
Occupational Therapy Session Note  Patient Details  Name: Jason Nolan MRN: [id number] Date of Birth: 1945/09/24  Today's Date: 12/05/2016 OT Individual Time: [id number] OT Individual Time Calculation (min): 32 min    Short Term Goals: Week 1:  OT Short Term Goal 1 (Week 1): Pt will perform shower transfer with mod A. OT Short Term Goal 2 (Week 1): Pt will perform UB dressing with min A in order to decrease level of assist with self care. OT Short Term Goal 3 (Week 1): Pt will display min A standing balance during LB clothing management.  Skilled Therapeutic Interventions/Progress Updates:    Pt received sitting up in w/c agreeable to OT tx session. Focus of session on LUE NMR, standing balance, functional mobility transfers. Transported Pt totalA via w/c to therapy gym for time management. Pt completed stand pivot transfer to R with MinA to edge of mat table. Pt participated in seated and standing activity incorporating LUE AA/A/PROM while seated for forearm pronation/supination, elbow flex/ext, wrist flex/ext, and shoulder internal/external rotation. Utilized theraputty with focus on wt bearing through LUE with Pt utilizing RUE HOH to assist. Pt participated in standing card sorting activity with therapist facilitating wt bearing through LUE and providing Min steady A in standing to complete. Pt completes stand pivot transfer edge of mat to w/c to the L with MinA, transported back to room in manner described above where Pt was left seated in w/c, call bell and needs within reach.   Therapy Documentation Precautions:  Precautions Precautions: Fall Restrictions Weight Bearing Restrictions: No   Pain: Pain Assessment Pain Assessment: No/denies pain  See Function Navigator for Current Functional Status.   Therapy/Group: Individual Therapy  Orlando PennerBreanna L Ayde Record 12/05/2016, 3:34 PM

## 2016-12-05 NOTE — Progress Notes (Signed)
Physical Therapy Session Note  Patient Details  Name: Jason CroakJames Balz MRN: [id number] Date of Birth: May 11, 1945  Today's Date: 12/05/2016 PT Individual Time: 1500-1600 PT Individual Time Calculation (min): 60 min   Short Term Goals: Week 1:  PT Short Term Goal 1 (Week 1): Pt will transfer with mod assist consistently PT Short Term Goal 2 (Week 1): Pt will ambulate 6750ft with max assist and LRAD PT Short Term Goal 3 (Week 1): Pt will perform WC mobility x17450ft with supervision assist consistently  PT Short Term Goal 4 (Week 1): PT will perform bed mobility with supervision assist   Skilled Therapeutic Interventions/Progress Updates:    no c/o pain. PT treatment session focused on L LE hip strengthening, static standing balance and ambulation. Pt sitting in w/c upon arrival, agreeable to PT. PT rolled pt to gym for energy conservation and time. Squat pivot transfer w/c to mat with min A for steadying. Sit to supine with supervision and verbal cues for attention to L UE and sequencing. Rolled R and L with supervision for safety. Pt performed R side lying clamshells and straight leg hip abduction x 2 sets of each to fatigue with facilitation for correct form and tactile cues for muscle activation. Supine to sit with supervision for safety. Pt performed standing balance on airex narrow BOS with min A for balance progressing to semi-tandem with mod A for balance and minimal R UE HHA. Pt ambulated 14975ft with RW and L hand splint with mod A for balance and verbal cues for L LE clearance with swing and tactile cues for glute activation during L LE stance phase. Pt educated on safety with attention to L LE placement during ambulation. Pt performed Biodex weight shifting in all directions with R UE support and min A for steadying progressing to no UE support with min A for steadying. Pt performed B LE w/c mobility 18675ft with supervision and verbal cues for L LE heel-to-toe propulsion. Pt left sitting in w/c with  QRB on and call bell in reach.   Therapy Documentation Precautions:  Precautions Precautions: Fall Restrictions Weight Bearing Restrictions: No   See Function Navigator for Current Functional Status.   Therapy/Group: Individual Therapy  Hillary Struss 12/05/2016, 4:50 PM

## 2016-12-05 NOTE — Progress Notes (Signed)
Speech Language Pathology Daily Session Note  Patient Details  Name: Jason Nolan MRN: [id number] Date of Birth: 11/01/1945  Today's Date: 12/05/2016 SLP Individual Time: 1130-1200 SLP Individual Time Calculation (min): 30 min  Short Term Goals: Week 1: SLP Short Term Goal 1 (Week 1): Pt will consume dys 3 textures and thin liquids with mod I use of swallowing precautions over 2 consecutive sessions.  SLP Short Term Goal 2 (Week 1): Pt will consume trials of regular textures with mod I use of swallowing precautions over 2 consecutive sessions prior to advancement.   SLP Short Term Goal 3 (Week 1): Pt will utilize overarticulation to achieve intelligibility at the word level with mod assist multimodal cues.   SLP Short Term Goal 4 (Week 1): Pt will communicate his immediate needs and wants via multimodal means with supervision verbal cues.   SLP Short Term Goal 5 (Week 1): Pt will recognize and correct verbal errors at the word level with mod assist verbal cues.    Skilled Therapeutic Interventions: Skilled treatment session focused on speech and dysphagia goals. SLP facilitated session by providing Mod A verbal cues for use of intelligibility strategies at the phrase level to achieve ~75% intelligibility. SLP also facilitated session by providing skilled observation with upgraded trial tray of regular textures with thin liquids. Patient consumed meal with efficient mastication and mild oral residue that cleared with a cued liquid wash. Patient also demonstrated mild left anterior spillage that he self-monitored and corrected with supervision verbal cues. Recommend patient upgrade to regular textures. Patient left upright in wheelchair with all needs within reach. Continue with current plan of care.      Function:  Eating Eating   Modified Consistency Diet: No Eating Assist Level: More than reasonable amount of time;Set up assist for;Supervision or verbal cues   Eating Set Up Assist For:  Opening containers       Cognition Comprehension Comprehension assist level: Follows basic conversation/direction with no assist  Expression   Expression assist level: Expresses basic 50 - 74% of the time/requires cueing 25 - 49% of the time. Needs to repeat parts of sentences.  Social Interaction Social Interaction assist level: Interacts appropriately 90% of the time - Needs monitoring or encouragement for participation or interaction.  Problem Solving Problem solving assist level: Solves basic problems with no assist  Memory Memory assist level: Recognizes or recalls 90% of the time/requires cueing < 10% of the time    Pain Pain Assessment Pain Assessment: No/denies pain Pain Score: 0-No pain  Therapy/Group: Individual Therapy  Jason Nolan 12/05/2016, 11:58 AM

## 2016-12-05 NOTE — Progress Notes (Signed)
Speech Language Pathology Daily Session Note  Patient Details  Name: Jason CroakJames Nolan MRN: [id number] Date of Birth: [birth year]  Today's Date: 12/05/2016 SLP Individual Time: 1400-1430 SLP Individual Time Calculation (min): 30 min  Short Term Goals: Week 1: SLP Short Term Goal 1 (Week 1): Pt will consume dys 3 textures and thin liquids with mod I use of swallowing precautions over 2 consecutive sessions.  SLP Short Term Goal 2 (Week 1): Pt will consume trials of regular textures with mod I use of swallowing precautions over 2 consecutive sessions prior to advancement.   SLP Short Term Goal 3 (Week 1): Pt will utilize overarticulation to achieve intelligibility at the word level with mod assist multimodal cues.   SLP Short Term Goal 4 (Week 1): Pt will communicate his immediate needs and wants via multimodal means with supervision verbal cues.   SLP Short Term Goal 5 (Week 1): Pt will recognize and correct verbal errors at the word level with mod assist verbal cues.    Skilled Therapeutic Interventions:   Pt was seen for skilled ST targeting communication goals.  Pt utilized slow rate to achieve intelligibility during both structured and unstructured communication tasks with overall min-mod assist verbal cues.  He was able to recognize and correct verbal errors with min assist.  Pt was left in wheelchair with call bell within reach at the end of today's therapy session.  Continue per current plan of care.    Function:  Eating Eating                 Cognition Comprehension Comprehension assist level: Follows basic conversation/direction with no assist  Expression   Expression assist level: Expresses basic 50 - 74% of the time/requires cueing 25 - 49% of the time. Needs to repeat parts of sentences.  Social Interaction Social Interaction assist level: Interacts appropriately 90% of the time - Needs monitoring or encouragement for participation or interaction.  Problem Solving Problem  solving assist level: Solves basic problems with no assist  Memory Memory assist level: Recognizes or recalls 90% of the time/requires cueing < 10% of the time    Pain Pain Assessment Pain Assessment: No/denies pain  Therapy/Group: Individual Therapy  Idan Prime, Melanee SpryNicole L 12/05/2016, 4:26 PM

## 2016-12-05 NOTE — Patient Care Conference (Signed)
Inpatient RehabilitationTeam Conference and Plan of Care Update Date: 12/04/2016   Time: 11:25 AM    Patient Name: Jason Nolan      Medical Record Number: [id number]  Date of Birth: Sep 16, 1945 Sex: Male         Room/Bed: 4M09C/4M09C-01 Payor Info: Payor: MEDICARE / Plan: MEDICARE PART A AND B / Product Type: *No Product type* /    Admitting Diagnosis: R CVA  Admit Date/Time:  11/29/2016  4:16 PM Admission Comments: No comment available   Primary Diagnosis:  Acute right MCA stroke (HCC) Principal Problem: Acute right MCA stroke [facility](HCC)  Patient Active Problem List   Diagnosis Date Noted  . Flaccid monoplegia of upper extremity (HCC)   . Hypoalbuminemia due to protein-calorie malnutrition ([facility])   . Dysarthria, post-stroke   . Benign essential HTN   . Acute blood loss anemia   . Acute right MCA stroke ([facility]) 11/29/2016  . Dysphagia, oropharyngeal 11/29/2016  . Essential hypertension 11/29/2016  . Cerebral embolism with cerebral infarction 11/25/2016  . 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 Leukopenia 03/31/2016  . Hepatitis C 07/22/2013    Expected Discharge Date: Expected Discharge Date: 12/21/16  Team Members Present: Physician leading conference: Dr. Claudette LawsAndrew Kirsteins Social Worker Present: Staci AcostaJenny Kylee Umana, LCSW Nurse Present: Chrissie NoaMelanie Barnes, RN PT Present: Teodoro Kilaitlin Penven-Crew, PT OT Present: Kearney HardElisabeth Doe, OT SLP Present: Feliberto Gottronourtney Payne, SLP PPS Coordinator present : Tora DuckMarie Noel, RN, CRRN     Current Status/Progress Goal Weekly Team Focus  Medical   severe dysarthria, Left arm strength improving,   maintain med stability, improve speech intelligibility  advance swallowing   Bowel/Bladder   cont b&b; lbm 7/30  maintain continence  assess continence q shift and prn   Swallow/Nutrition/ Hydration   Dys. 3 textures with thin liquids, Mod I  Mod I  trials of regular  textures    ADL's   Min/Mod A transfers, Min/Mod A bathing/dressing, improved L IE AROM  Supervision goals  L NMR, modified bathing/dressing, transfer training, standing balance, functional use of LUE   Mobility   min A bed mobility and transfers, mod A ambulation with RW, max A stairs, supervision w/c mobility  supervision bed mobility, transfers, and gait with LRAD; min A stairs; mod-I w/c  dynamic standing balance, LE strengthening, and gait training   Communication   Mod-Max A  Min A  use of intelligibility strategies    Safety/Cognition/ Behavioral Observations            Pain   denies pain         Skin   no skin issues noted          Rehab Goals Patient on target to meet rehab goals: Yes Rehab Goals Revised: none *See Care Plan and progress notes for long and short-term goals.     Barriers to Discharge  Current Status/Progress Possible Resolutions Date Resolved   Physician    Medical stability;Lack of/limited family support     progressing toward goals  cont CIR      Nursing  Medical stability;Medication compliance               PT                    OT                  SLP  SW                Discharge Planning/Teaching Needs:  Pt plans to return to his home with his wife to provide supervision.  Wife will be offered family education closer to d/c.   Team Discussion:  Pt with severe dysarthria following right MCA infarct.  Pt is continent of bowel and bladder and has a good appetite.  Pt is min A with bed mobility and transfers; mod A for ambulation; and max A for stairs.  Pt with good movement back in left arm and ADLs are going well.  Pt's speech intelligibility will be main focus for ST.  He is understood about 50-70% of the time, but this is improving.  Pt is on D3 with thin liquids, but ST will be present with pt for lunch today to see if diet can be upgraded.  Revisions to Treatment Plan:  none    Continued Need for Acute Rehabilitation  Level of Care: The patient requires daily medical management by a physician with specialized training in physical medicine and rehabilitation for the following conditions: Daily direction of a multidisciplinary physical rehabilitation program to ensure safe treatment while eliciting the highest outcome that is of practical value to the patient.: Yes Daily medical management of patient stability for increased activity during participation in an intensive rehabilitation regime.: Yes Daily analysis of laboratory values and/or radiology reports with any subsequent need for medication adjustment of medical intervention for : Neurological problems  Donnarae Rae, Vista DeckJennifer Capps 12/05/2016, 10:54 AM

## 2016-12-05 NOTE — Progress Notes (Signed)
Social Work Patient ID: Jason CroakJames Nolan, male   DOB: February 27, 1946, 71 y.o.   MRN: [id number]   Jason Nolan, Jason DatesJennifer C, LCSW Social Worker Signed   Patient Care Conference Date of Service: 12/05/2016 10:53 AM      Hide copied text Hover for attribution information Inpatient RehabilitationTeam Conference and Plan of Care Update Date: 12/04/2016   Time: 11:25 AM      Patient Name: Jason Nolan      Medical Record Number: [id number]  Date of Birth: February 27, 1946 Sex: Male         Room/Bed: 4M09C/4M09C-01 Payor Info: Payor: MEDICARE / Plan: MEDICARE PART A AND B / Product Type: *No Product type* /     Admitting Diagnosis: R CVA  Admit Date/Time:  11/29/2016  4:16 PM Admission Comments: No comment available    Primary Diagnosis:  Acute right MCA stroke (HCC) Principal Problem: Acute right MCA stroke [facility](HCC)       Patient Active Problem List    Diagnosis Date Noted  . Flaccid monoplegia of upper extremity (HCC)    . Hypoalbuminemia due to protein-calorie malnutrition (HCC)    . Dysarthria, post-stroke    . Benign essential HTN    . Acute blood loss anemia    . Acute right MCA stroke (HCC) 11/29/2016  . Dysphagia, oropharyngeal 11/29/2016  . Essential hypertension 11/29/2016  . Cerebral embolism with cerebral infarction 11/25/2016  . 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 Leukopenia 03/31/2016  . Hepatitis C 07/22/2013      Expected Discharge Date: Expected Discharge Date: 12/21/16   Team Members Present: Physician leading conference: Dr. Claudette LawsAndrew Kirsteins Social Worker Present: Staci AcostaJenny Rhylen Shaheen, LCSW Nurse Present: Chrissie NoaMelanie Barnes, RN PT Present: Teodoro Kilaitlin Penven-Crew, PT OT Present: Kearney HardElisabeth Doe, OT SLP Present: Feliberto Gottronourtney Payne, SLP PPS Coordinator present : Tora DuckMarie Noel, RN, CRRN       Current Status/Progress Goal Weekly Team Focus  Medical     severe dysarthria, Left arm strength  improving,   maintain med stability, improve speech intelligibility  advance swallowing   Bowel/Bladder     cont b&b; lbm 7/30  maintain continence  assess continence q shift and prn   Swallow/Nutrition/ Hydration     Dys. 3 textures with thin liquids, Mod I  Mod I  trials of regular textures    ADL's     Min/Mod A transfers, Min/Mod A bathing/dressing, improved L IE AROM  Supervision goals  L NMR, modified bathing/dressing, transfer training, standing balance, functional use of LUE   Mobility     min A bed mobility and transfers, mod A ambulation with RW, max A stairs, supervision w/c mobility  supervision bed mobility, transfers, and gait with LRAD; min A stairs; mod-I w/c  dynamic standing balance, LE strengthening, and gait training   Communication     Mod-Max A  Min A  use of intelligibility strategies    Safety/Cognition/ Behavioral Observations             Pain     denies pain         Skin     no skin issues noted           Rehab Goals Patient on target to meet rehab goals: Yes Rehab Goals Revised: none *See Care Plan and progress notes for long and short-term goals.      Barriers to Discharge   Current Status/Progress  Possible Resolutions Date Resolved   Physician     Medical stability;Lack of/limited family support     progressing toward goals  cont CIR      Nursing   Medical stability;Medication compliance             PT                    OT                 SLP            SW              Discharge Planning/Teaching Needs:  Pt plans to return to his home with his wife to provide supervision.  Wife will be offered family education closer to d/c.   Team Discussion:  Pt with severe dysarthria following right MCA infarct.  Pt is continent of bowel and bladder and has a good appetite.  Pt is min A with bed mobility and transfers; mod A for ambulation; and max A for stairs.  Pt with good movement back in left arm and ADLs are going well.  Pt's speech  intelligibility will be main focus for ST.  He is understood about 50-70% of the time, but this is improving.  Pt is on D3 with thin liquids, but ST will be present with pt for lunch today to see if diet can be upgraded.  Revisions to Treatment Plan:  none    Continued Need for Acute Rehabilitation Level of Care: The patient requires daily medical management by a physician with specialized training in physical medicine and rehabilitation for the following conditions: Daily direction of a multidisciplinary physical rehabilitation program to ensure safe treatment while eliciting the highest outcome that is of practical value to the patient.: Yes Daily medical management of patient stability for increased activity during participation in an intensive rehabilitation regime.: Yes Daily analysis of laboratory values and/or radiology reports with any subsequent need for medication adjustment of medical intervention for : Neurological problems   Phineas Mcenroe, Vista DeckJennifer Capps 12/05/2016, 10:54 AM

## 2016-12-05 NOTE — Progress Notes (Signed)
Nurse tech reported to RN that patient day shift nurse tech reported that the patient had a fall during day shift, RN verified with patient, patient confirmed that he fell, denies pain and discomfort and resting in the bed, VS WNL, night shift charge nurse is notified.

## 2016-12-05 NOTE — Progress Notes (Signed)
Occupational Therapy Session Note  Patient Details  Name: Jason Nolan MRN: [id number] Date of Birth: 1945-07-18  Today's Date: 12/05/2016 OT Individual Time: 0900-1000 OT Individual Time Calculation (min): 60 min   Short Term Goals: Week 1:  OT Short Term Goal 1 (Week 1): Pt will perform shower transfer with mod A. OT Short Term Goal 2 (Week 1): Pt will perform UB dressing with min A in order to decrease level of assist with self care. OT Short Term Goal 3 (Week 1): Pt will display min A standing balance during LB clothing management.  Skilled Therapeutic Interventions/Progress Updates:    OT treatment session focused on transfers, modified bathing/dressing, standing balance, and L NMR. Pt completed stand-pivot transfers with min A L and R. Focused on head hips relationship during transfers. Pt transferred into shower and able to maintain standing balance with min guard A while reaching to doff pants. Assistance required to don L wash mit,then incorporated L NMR techniques with distal support to wash R UE and upper legs. Pt required min A to thread L UE through sleeve today, and was able to thread both pant legs with supervision. Educated pt on friction reducing device to don TED hose as pt has increased L LE swelling. Addressed standing balance and L side weight bearing with standing grooming task. Pt able to use L UE as a stabilizer, then weight bear through  L UE while brushing his teeth with min guard A for balance. Pt then completed L UE there-ex and was left seated in wc at end of session with needs met.    Therapy Documentation Precautions:  Precautions Precautions: Fall Restrictions Weight Bearing Restrictions: No Pain: Pain Assessment Pain Assessment: No/denies pain Pain Score: 0-No pain  See Function Navigator for Current Functional Status.   Therapy/Group: Individual Therapy  Valma Cava 12/05/2016, 9:20 AM

## 2016-12-05 NOTE — Plan of Care (Signed)
Problem: RH SKIN INTEGRITY Goal: RH STG SKIN FREE OF INFECTION/BREAKDOWN Pt will remain free of infection and skin breakdown with Modified Independence  Outcome: Progressing No skin issues noted  Problem: RH SAFETY Goal: RH STG ADHERE TO SAFETY PRECAUTIONS W/ASSISTANCE/DEVICE STG Adhere to Safety Precautions With min Assistance/Device.   Outcome: Progressing No fall of injury this shift  Problem: RH PAIN MANAGEMENT Goal: RH STG PAIN MANAGED AT OR BELOW PT'S PAIN GOAL Pt's pain will be < or =2 with Min Assistance  Outcome: Progressing Denies pain

## 2016-12-05 NOTE — Progress Notes (Signed)
[facility] PHYSICAL MEDICINE & REHABILITATION     PROGRESS NOTE  Subjective/Complaints:   No issues overnite, discussed progress with motor recovery and with dysarthria  ROS: Denies CP, SOB, N/V/D.  Objective: Vital Signs: Blood pressure 134/72, pulse 68, temperature 98.6 F (37 C), temperature source Oral, resp. rate 17, height 5\' 10"  (1.778 m), weight 87 kg (191 lb 12.3 oz), SpO2 98 %. No results found.  Recent Labs  12/05/16 0423  WBC 4.9  HGB 12.5*  HCT 38.1*  PLT 233    Recent Labs  12/05/16 0423  NA 138  K 3.5  CL 106  GLUCOSE 98  BUN 15  CREATININE 0.97  CALCIUM 8.6*   CBG (last 3)  No results for input(s): GLUCAP in the last 72 hours.  Wt Readings from Last 3 Encounters:  12/04/16 87 kg (191 lb 12.3 oz)  11/25/16 96.9 kg (213 lb 10 oz)  11/25/16 90.7 kg (200 lb)    Physical Exam:  BP 134/72 (BP Location: Right Arm)   Pulse 68   Temp 98.6 F (37 C) (Oral)   Resp 17   Ht 5\' 10"  (1.778 m)   Wt 87 kg (191 lb 12.3 oz)   SpO2 98%   BMI 27.52 kg/m  Constitutional: He appears well-developedand well-nourished. No distress.  HENT: Normocephalicand atraumatic.  Eyes: EOMI. No discharge. Cardiovascular: RRR. No murmurheard. Respiratory: Effort normal breath sounds normal. GI: Soft. Bowel sounds are normal.  Musculoskeletal: He exhibits edema(LUE), no tenderness.  Neurological: He is alertand oriented Dysarthria.  Fair insight and awareness.  Left facial weakness  LUE: 0/5 proximal to distal  RUE/RLE: 4/5 proximal to distal LLE: 2/5 HF, KE, ADF/PF Skin: Skin is warmand dry. He is not diaphoretic.  Psychiatric:   Assessment/Plan: 1. Functional deficits secondary to embolic right MCA infarct which require 3+ hours per day of interdisciplinary therapy in a comprehensive inpatient rehab setting. Physiatrist is providing close team supervision and 24 hour management of active medical problems listed below. Physiatrist and rehab team continue to  assess barriers to discharge/monitor patient progress toward functional and medical goals.  Function:  Bathing Bathing position   Position: Shower  Bathing parts Body parts bathed by patient: Chest, Abdomen, Front perineal area, Right upper leg, Left upper leg Body parts bathed by helper: Right arm, Left arm, Buttocks, Right lower leg, Left lower leg, Back  Bathing assist Assist Level: Touching or steadying assistance(Pt > 75%)      Upper Body Dressing/Undressing Upper body dressing   What is the patient wearing?: Pull over shirt/dress     Pull over shirt/dress - Perfomed by patient: Put head through opening, Thread/unthread right sleeve Pull over shirt/dress - Perfomed by helper: Pull shirt over trunk, Thread/unthread left sleeve        Upper body assist Assist Level: Touching or steadying assistance(Pt > 75%)      Lower Body Dressing/Undressing Lower body dressing   What is the patient wearing?: Underwear, Pants, Non-skid slipper socks Underwear - Performed by patient: Thread/unthread right underwear leg Underwear - Performed by helper: Thread/unthread left underwear leg, Pull underwear up/down Pants- Performed by patient: Thread/unthread right pants leg Pants- Performed by helper: Thread/unthread left pants leg, Pull pants up/down   Non-skid slipper socks- Performed by helper: Don/doff left sock, Don/doff right sock                  Lower body assist Assist for lower body dressing: Touching or steadying assistance (Pt > 75%) (patient did  2/8, 25%, max assist level)      Toileting Toileting     Toileting steps completed by helper: Adjust clothing prior to toileting    Toileting assist     Transfers Chair/bed transfer   Chair/bed transfer method: Stand pivot Chair/bed transfer assist level: Touching or steadying assistance (Pt > 75%) Chair/bed transfer assistive device: Armrests     Locomotion Ambulation     Max distance: 13950ft Assist level: Moderate  assist (Pt 50 - 74%)   Wheelchair   Type: Manual Max wheelchair distance: 14150ft Assist Level: Supervision or verbal cues  Cognition Comprehension Comprehension assist level: Follows basic conversation/direction with no assist  Expression Expression assist level: Expresses basic 50 - 74% of the time/requires cueing 25 - 49% of the time. Needs to repeat parts of sentences.  Social Interaction Social Interaction assist level: Interacts appropriately 90% of the time - Needs monitoring or encouragement for participation or interaction.  Problem Solving Problem solving assist level: Solves basic problems with no assist  Memory Memory assist level: Recognizes or recalls 90% of the time/requires cueing < 10% of the time    Medical Problem List and Plan: 1. Left hemiparesis and speech/swallowing difficultiessecondary to embolic right MCA infarct  Cont CIR PT, OT, SLP-2. DVT Prophylaxis/Anticoagulation: Pharmaceutical: Lovenox,Brillinta 3. Pain Management: tylenol prn  4. Mood: LCSW to follow for evaluation and support. Team to provide ego support--showing signs of adjustment reaction. 5. Neuropsych: This patient appears to be capable of making decisions on hisown behalf. 6. Skin/Wound Care: routine pressure relief measures.  7. Fluids/Electrolytes/Nutrition: Monitor I/Os  BMP within acceptable range on 7/28 8. Glaucoma: Continue Xalatan as at home. 9. ABLA:   Hb 12.2 on 7/28  Cont to monitor 10. Elevated triglycerides: Now on Lipitor.  11. HTN: Controlled 8/2 less lability  Vitals:   12/04/16 2312 12/05/16 0400  BP: 123/73 134/72  Pulse: 76 68  Resp: 17 17  Temp: 98.3 F (36.8 C) 98.6 F (37 C)   12. Dysphagia: pt on D3 diet per acute SLP recs: pt very dysarthric with poor oro-motor control. Aspiration precautions- making slow improvements 13. Hypoalbuminemia  Supplement initiated 7/28  LOS (Days) 6 A FACE TO FACE EVALUATION WAS PERFORMED  KIRSTEINS,ANDREW E 12/05/2016 8:21 AM

## 2016-12-05 NOTE — Progress Notes (Signed)
Social Work Assessment and Plan  Patient Details  Name: Xzayvier Fagin MRN: [id number] Date of Birth: 10/28/1945  Today's Date: 12/03/2016  Problem List:  Patient Active Problem List   Diagnosis Date Noted  . 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 Hypoalbuminemia due to protein-calorie malnutrition ([place])   . Dysarthria, post-stroke   . Benign essential HTN   . Acute blood loss anemia   . Acute right MCA stroke ([place]) 11/29/2016  . Dysphagia, oropharyngeal 11/29/2016  . Essential hypertension 11/29/2016  . Cerebral embolism with cerebral infarction 11/25/2016  . Acute ischemic stroke ([place]) - R MCA embolic stroke in setting of ruptured R ICA plaque w dissection, s/p R MCA stent and thrombectomy 11/25/2016  . Leukopenia 03/31/2016  . Hepatitis C 07/22/2013   Past Medical History:  Past Medical History:  Diagnosis Date  . Glaucoma   . Hepatitis C    Past Surgical History:  Past Surgical History:  Procedure Laterality Date  . IR ANGIO INTRA EXTRACRAN SEL COM CAROTID INNOMINATE UNI L MOD SED  11/25/2016  . IR ANGIO VERTEBRAL SEL SUBCLAVIAN INNOMINATE UNI R MOD SED  11/25/2016  . IR ANGIO VERTEBRAL SEL VERTEBRAL UNI L MOD SED  11/25/2016  . IR INTRAVSC STENT CERV CAROTID W/O EMB-PROT MOD SED INC ANGIO  11/25/2016  . IR PERCUTANEOUS ART THROMBECTOMY/INFUSION INTRACRANIAL INC DIAG ANGIO  11/25/2016  . RADIOLOGY WITH ANESTHESIA N/A 11/25/2016   Procedure: RADIOLOGY WITH ANESTHESIA;  Surgeon: Radiologist, Medication, MD;  Location: [facility];  Service: Radiology;  Laterality: N/A;   Social History:  reports that he has quit smoking. He quit smokeless tobacco use about 23 years ago. He reports that he does not use drugs. His alcohol history is not on file.  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   Abuse/Neglect Physical Abuse: Denies Verbal Abuse: Denies Sexual Abuse: Denies Exploitation of patient/patient's resources: Denies Self-Neglect: Denies  Emotional Status Pt's affect, behavior and adjustment status: Pt reports feeling okay emotionally and smiled several times during our visit.  He is understandably frustrated this happened and wants to know why, but is coping well and taking things as they come and working hard in therapies. Recent Psychosocial Issues: none reported Psychiatric History: none reported Substance Abuse History: none reported  Patient / Family Perceptions, Expectations & Goals Pt/Family understanding of illness & functional limitations: Pt would like more information on why the stroke happened and what he can do to prevent future strokes.  CSW asked PA to meet with him about this. Premorbid pt/family roles/activities: Pt enjoys spending time with his wife and friends.  He likes the [place] sports teams (Yankees and Giants especially.)  Pt also spends a lot of time at the gym working out. Anticipated changes in roles/activities/participation: Pt would like to resume activities as he is able. Pt/family expectations/goals: Pt wants to get back to doing things for himself.  Community [employer] Agencies: None Premorbid Home  Care/DME Agencies: None Transportation available at discharge: friends Resource referrals recommended:  Support group (specify) (Stroke support group)  Discharge Planning Living Arrangements: Spouse/significant other Support Systems: Spouse/significant other, Other relatives, Friends/neighbors Type of Residence: Private residence Insurance Resources: [employer], Multimedia programmer (specify) Scientist, clinical (histocompatibility and immunogenetics)) Financial Resources: Fish farm manager, Other (Comment) (pension) Financial Screen Referred: No Money Management: Patient Does the patient have any problems obtaining your medications?: No Home Management: Pt and wife share the household duties. Patient/Family Preliminary Plans: Pt plans to return to his one level ranch home with his wife at d/c Social Work Anticipated Follow Up Needs: HH/OP, Support Group Expected length of stay: 3 weeks  Clinical Impression CSW met with pt to introduce self and role of CSW, as well as to complete assessment.  Pt's wife was not present during visit, so CSW will f/u with her.  She does not drive, so she has to rely on friends to bring her to visit pt.  CSW can also speak to her via telephone.  Pt is motivated to rehabilitate and is very appreciative of being on CIR and having the opportunity to get better.  He is a Scientist, research (physical sciences).  Pt plans to return to his ranch style home (one step to enter) and his wife will be with him to provide supervision.  CSW encouraged pt to start thinking about people who could help with transportation to MD appts and errands, etc.  Pt was surprised to hear that he would not be driving initially when he goes home.  CSW will continue to follow and assist as needed.  Wilbert Schouten, Silvestre Mesi 12/03/2016, 1:45 PM

## 2016-12-06 ENCOUNTER — Inpatient Hospital Stay (HOSPITAL_COMMUNITY): Payer: Medicare Other | Admitting: Occupational Therapy

## 2016-12-06 ENCOUNTER — Inpatient Hospital Stay (HOSPITAL_COMMUNITY): Payer: Medicare Other | Admitting: Speech Pathology

## 2016-12-06 ENCOUNTER — Inpatient Hospital Stay (HOSPITAL_COMMUNITY): Payer: Medicare Other | Admitting: Physical Therapy

## 2016-12-06 NOTE — Progress Notes (Signed)
Physical Therapy Weekly Progress Note  Patient Details  Name: Jason Nolan MRN: [id number] Date of Birth: 11-11-1945  Beginning of progress report period: November 30, 2016 End of progress report period: December 06, 2016  Today's Date: 12/06/2016 PT Individual Time: 1500-1600 PT Individual Time Calculation (min): 60 min   Patient has met 4 of 4 short term goals.  Pt is making great progress towards LTGs.  Patient continues to demonstrate the following deficits muscle weakness, decreased cardiorespiratoy endurance, impaired timing and sequencing, abnormal tone, unbalanced muscle activation and decreased coordination, decreased attention to left, decreased awareness, decreased safety awareness and delayed processing and decreased sitting balance, decreased standing balance, decreased postural control, hemiplegia and decreased balance strategies and therefore will continue to benefit from skilled PT intervention to increase functional independence with mobility.  Patient progressing toward long term goals..  Continue plan of care.  PT Short Term Goals Week 1:  PT Short Term Goal 1 (Week 1): Pt will transfer with mod assist consistently PT Short Term Goal 1 - Progress (Week 1): Met PT Short Term Goal 2 (Week 1): Pt will ambulate 51f with max assist and LRAD PT Short Term Goal 2 - Progress (Week 1): Met PT Short Term Goal 3 (Week 1): Pt will perform WC mobility [id number] supervision assist consistently  PT Short Term Goal 3 - Progress (Week 1): Met PT Short Term Goal 4 (Week 1): PT will perform bed mobility with supervision assist  PT Short Term Goal 4 - Progress (Week 1): Met Week 2:  PT Short Term Goal 1 (Week 2): Pt will ambulate [id number] LRAD with min A in controlled environment  PT Short Term Goal 2 (Week 2): Pt will ascend/descend 4 steps with min A  PT Short Term Goal 3 (Week 2): Pt will initiate gait training in the community PT Short Term Goal 4 (Week 2): Pt will be min A with w/c  mobility in community environment  Skilled Therapeutic Interventions/Progress Updates:    no c/o pain. PT treatment session focused on L LE NMR during functional activities and L UE attention and weight bearing during ambulation.  Pt sitting in w/c upon arrival, agreeable to PT. PT rolled pt to/from gym in w/c for time and energy conservation. Stand pivot transfer w/c to mat with min A for steadying. Sit <> supine with supervision on mat table for safety. Supine heel slides 2 sets of 8 on L LE starting in hip extension off mat for increased ROM and simulating swing phase of gait. Standing R LE hip abduction in parallel bars with B UE support progressing to L UE support with mod A for balance and L knee extension cues. Lateral side stepping in parallel bars with B UE support progressing to L UE support with min A for balance and verbal cues for L LE foot clearance and placement. Ambulation in parallel bars down/back 10 times with B UE support progressing to L UE support with min A for balance. PT assisting with L UE grasp on bar with verbal cues for L LE placement and tactile cues for equal weight shifting. Pt ambulated [id number] room with RW and L UE hand splint with mod A for balance and mod verbal cues for L step length. Pt left in w/c with QRB on and call bell in reach.  Therapy Documentation Precautions:  Precautions Precautions: Fall Restrictions Weight Bearing Restrictions: No  See Function Navigator for Current Functional Status.  Therapy/Group: Individual Therapy  Jason Nolan 12/06/2016, 5:36  PM

## 2016-12-06 NOTE — Progress Notes (Signed)
Occupational Therapy Session Note  Patient Details  Name: Jason Nolan MRN: [id number] Date of Birth: 1945/10/05  Today's Date: 12/06/2016 OT Individual Time: 1105-1208 OT Individual Time Calculation (min): 63 min    Short Term Goals: Week 1:  OT Short Term Goal 1 (Week 1): Pt will perform shower transfer with mod A. OT Short Term Goal 2 (Week 1): Pt will perform UB dressing with min A in order to decrease level of assist with self care. OT Short Term Goal 3 (Week 1): Pt will display min A standing balance during LB clothing management.  Skilled Therapeutic Interventions/Progress Updates:    OT treatment session focused on functional transfers, standing balance, functional use of L UE, modified bathing dressing, and L NMR. Pt transfers stand-pivot with Min A to L and R today. Pt able to stand to void bladder facing commode with min A assist for balance and wc set-up. Pt donned wash mit onto L UE with set-up A, then able to utilize guided techniques and AAROM of L UE to assist with bathing. Min A for LB dressing with OT providing hand-over hand A to L UE for shoulder external rotation needed to pull up pants. Pt then participated in L NMR focused on shoulder abduction/adduction/flexion/ext, elbow flex/ext, forearm pronation/supination, wrist flex/ext, and finger flexion. Pt with increased L UE function and was able to actively flex fingers today. Pt left seated in wc with L lap tray on and needs met.   Therapy Documentation Precautions:  Precautions Precautions: Fall Restrictions Weight Bearing Restrictions: No Pain: Pain Assessment Pain Assessment: No/denies pain Pain Score: 0-No pain  See Function Navigator for Current Functional Status.   Therapy/Group: Individual Therapy  Valma Cava 12/06/2016, 11:52 AM

## 2016-12-06 NOTE — Progress Notes (Signed)
[place] PHYSICAL MEDICINE & REHABILITATION     PROGRESS NOTE  Subjective/Complaints:   Slept well good appetite  ROS: Denies CP, SOB, N/V/D.  Objective: Vital Signs: Blood pressure 120/70, pulse 70, temperature 98.5 F (36.9 C), temperature source Oral, resp. rate 18, height 5\' 10"  (1.778 m), weight 87 kg (191 lb 12.3 oz), SpO2 96 %. No results found.  Recent Labs  12/05/16 0423  WBC 4.9  HGB 12.5*  HCT 38.1*  PLT 233    Recent Labs  12/05/16 0423  NA 138  K 3.5  CL 106  GLUCOSE 98  BUN 15  CREATININE 0.97  CALCIUM 8.6*   CBG (last 3)  No results for input(s): GLUCAP in the last 72 hours.  Wt Readings from Last 3 Encounters:  12/04/16 87 kg (191 lb 12.3 oz)  11/25/16 96.9 kg (213 lb 10 oz)  11/25/16 90.7 kg (200 lb)    Physical Exam:  BP 120/70 (BP Location: Right Arm)   Pulse 70   Temp 98.5 F (36.9 C) (Oral)   Resp 18   Ht 5\' 10"  (1.778 m)   Wt 87 kg (191 lb 12.3 oz)   SpO2 96%   BMI 27.52 kg/m  Constitutional: He appears well-developedand well-nourished. No distress.  HENT: Normocephalicand atraumatic.  Eyes: EOMI. No discharge. Cardiovascular: RRR. No murmurheard. Respiratory: Effort normal breath sounds normal. GI: Soft. Bowel sounds are normal.  Musculoskeletal: He exhibits edema(LUE), no tenderness.  Neurological: He is alertand oriented Dysarthria.  Fair insight and awareness.  Left facial weakness  LUE: 0/5 proximal to distal  RUE/RLE: 4/5 proximal to distal LLE: 2/5 HF, KE, ADF/PF Skin: Skin is warmand dry. He is not diaphoretic.  Psychiatric:   Assessment/Plan: 1. Functional deficits secondary to embolic right MCA infarct which require 3+ hours per day of interdisciplinary therapy in a comprehensive inpatient rehab setting. Physiatrist is providing close team supervision and 24 hour management of active medical problems listed below. Physiatrist and rehab team continue to assess barriers to discharge/monitor patient  progress toward functional and medical goals.  Function:  Bathing Bathing position   Position: Shower  Bathing parts Body parts bathed by patient: Chest, Abdomen, Front perineal area, Right upper leg, Left upper leg Body parts bathed by helper: Right arm, Left arm, Buttocks, Right lower leg, Left lower leg, Back  Bathing assist Assist Level: Touching or steadying assistance(Pt > 75%)      Upper Body Dressing/Undressing Upper body dressing   What is the patient wearing?: Pull over shirt/dress     Pull over shirt/dress - Perfomed by patient: Put head through opening, Thread/unthread right sleeve Pull over shirt/dress - Perfomed by helper: Pull shirt over trunk, Thread/unthread left sleeve        Upper body assist Assist Level: Touching or steadying assistance(Pt > 75%)      Lower Body Dressing/Undressing Lower body dressing   What is the patient wearing?: Underwear, Pants, Non-skid slipper socks Underwear - Performed by patient: Thread/unthread right underwear leg Underwear - Performed by helper: Thread/unthread left underwear leg, Pull underwear up/down Pants- Performed by patient: Thread/unthread right pants leg Pants- Performed by helper: Thread/unthread left pants leg, Pull pants up/down   Non-skid slipper socks- Performed by helper: Don/doff left sock, Don/doff right sock                  Lower body assist Assist for lower body dressing: Touching or steadying assistance (Pt > 75%) (patient did 2/8, 25%, max assist level)  Toileting Toileting     Toileting steps completed by helper: Adjust clothing prior to toileting    Toileting assist     Transfers Chair/bed transfer   Chair/bed transfer method: Squat pivot Chair/bed transfer assist level: Touching or steadying assistance (Pt > 75%) Chair/bed transfer assistive device: Armrests     Locomotion Ambulation     Max distance: 15475ft Assist level: Moderate assist (Pt 50 - 74%)   Wheelchair   Type:  Manual Max wheelchair distance: 16575ft Assist Level: Supervision or verbal cues  Cognition Comprehension Comprehension assist level: Follows basic conversation/direction with no assist  Expression Expression assist level: Expresses basic 50 - 74% of the time/requires cueing 25 - 49% of the time. Needs to repeat parts of sentences.  Social Interaction Social Interaction assist level: Interacts appropriately 90% of the time - Needs monitoring or encouragement for participation or interaction.  Problem Solving Problem solving assist level: Solves basic problems with no assist  Memory Memory assist level: Recognizes or recalls 90% of the time/requires cueing < 10% of the time    Medical Problem List and Plan: 1. Left hemiparesis and speech/swallowing difficultiessecondary to embolic right MCA infarct  Cont CIR PT, OT, SLP-2. DVT Prophylaxis/Anticoagulation: Pharmaceutical: Lovenox,Brillinta 3. Pain Management: tylenol prn  4. Mood: LCSW to follow for evaluation and support. Team to provide ego support--showing signs of adjustment reaction. 5. Neuropsych: This patient appears to be capable of making decisions on hisown behalf. 6. Skin/Wound Care: routine pressure relief measures.  7. Fluids/Electrolytes/Nutrition: Monitor I/Os  BMP within acceptable range on 7/28 8. Glaucoma: Continue Xalatan as at home. 9. ABLA:   Hb 12.2 on 7/28  Cont to monitor 10. Elevated triglycerides: Now on Lipitor.  11. HTN: Controlled 8/3   Vitals:   12/05/16 1300 12/06/16 0414  BP: 134/62 120/70  Pulse: 63 70  Resp: 18 18  Temp: 98.1 F (36.7 C) 98.5 F (36.9 C)   12. Dysphagia: pt on D3 diet per acute SLP recs: pt very dysarthric with poor oro-motor control. Aspiration precautions- making slow improvements 13. Hypoalbuminemia  Supplement initiated 7/28  LOS (Days) 7 A FACE TO FACE EVALUATION WAS PERFORMED  KIRSTEINS,ANDREW E 12/06/2016 8:01 AM

## 2016-12-06 NOTE — Progress Notes (Signed)
Social Work Patient ID: Jason Nolan, male   DOB: 10/07/1945, 70 y.o.   MRN: [id number]   CSW met with pt and then later talked to his wife to update them on team conference discussion and targeted d/c date of 12-21-16.  Pt's wife confirmed that she will be able to provide supervision for pt at home.  She is pleased with pt's progress already and is glad he has some more time on CIR.  CSW will continue to follow and assist as needed.  

## 2016-12-06 NOTE — Progress Notes (Signed)
Speech Language Pathology Daily Session Note  Patient Details  Name: Jason Nolan MRN: [id number] Date of Birth: 1945-10-11  Today's Date: 12/06/2016 SLP Individual Time: 0730-0800 SLP Individual Time Calculation (min): 30 min  Short Term Goals: Week 1: SLP Short Term Goal 1 (Week 1): Pt will consume dys 3 textures and thin liquids with mod I use of swallowing precautions over 2 consecutive sessions.  SLP Short Term Goal 2 (Week 1): Pt will consume trials of regular textures with mod I use of swallowing precautions over 2 consecutive sessions prior to advancement.   SLP Short Term Goal 3 (Week 1): Pt will utilize overarticulation to achieve intelligibility at the word level with mod assist multimodal cues.   SLP Short Term Goal 4 (Week 1): Pt will communicate his immediate needs and wants via multimodal means with supervision verbal cues.   SLP Short Term Goal 5 (Week 1): Pt will recognize and correct verbal errors at the word level with mod assist verbal cues.    Skilled Therapeutic Interventions: Skilled treatment session focused on speech goals. SLP facilitated session by providing Mod A verbal cues for patient to self-monitor and correct articulatory errors and use of speech intelligibility strategies at the word and phrase level during a decoding task. Overall, patient was ~75% intelligible throughout session. Patient left sitting [name] eating his breakfast with bed alarm on. Continue with current plan of care.      Function:  Cognition Comprehension Comprehension assist level: Follows basic conversation/direction with no assist  Expression   Expression assist level: Expresses basic 50 - 74% of the time/requires cueing 25 - 49% of the time. Needs to repeat parts of sentences.  Social Interaction Social Interaction assist level: Interacts appropriately 90% of the time - Needs monitoring or encouragement for participation or interaction.  Problem Solving Problem solving assist level:  Solves basic problems with no assist  Memory Memory assist level: More than reasonable amount of time    Pain Pain Assessment Pain Assessment: No/denies pain  Therapy/Group: Individual Therapy  Kody Brandl 12/06/2016, 4:09 PM

## 2016-12-06 NOTE — Progress Notes (Signed)
Speech Language Pathology Weekly Progress and Session Note  Patient Details  Name: Jason Nolan MRN: [id number] Date of Birth: 01/14/1946  Beginning of progress report period: November 29, 2016 End of progress report period: December 06, 2016  Today's Date: 12/06/2016 SLP Individual Time: 0730-0800 SLP Individual Time Calculation (min): 30 min  Short Term Goals: Week 1: SLP Short Term Goal 1 (Week 1): Pt will consume dys 3 textures and thin liquids with mod I use of swallowing precautions over 2 consecutive sessions.  SLP Short Term Goal 1 - Progress (Week 1): Met SLP Short Term Goal 2 (Week 1): Pt will consume trials of regular textures with mod I use of swallowing precautions over 2 consecutive sessions prior to advancement.   SLP Short Term Goal 2 - Progress (Week 1): Met SLP Short Term Goal 3 (Week 1): Pt will utilize overarticulation to achieve intelligibility at the word level with mod assist multimodal cues.   SLP Short Term Goal 3 - Progress (Week 1): Met SLP Short Term Goal 4 (Week 1): Pt will communicate his immediate needs and wants via multimodal means with supervision verbal cues.   SLP Short Term Goal 4 - Progress (Week 1): Not met SLP Short Term Goal 5 (Week 1): Pt will recognize and correct verbal errors at the word level with mod assist verbal cues.   SLP Short Term Goal 5 - Progress (Week 1): Met    New Short Term Goals: Week 2: SLP Short Term Goal 1 (Week 2): Pt will utilize overarticulation to achieve intelligibility at the phrase level with mod assist multimodal cues.   SLP Short Term Goal 2 (Week 2): Pt will communicate his immediate needs and wants via multimodal means with supervision verbal cues.   SLP Short Term Goal 3 (Week 2): Pt will recognize and correct verbal errors at the phrase level with mod assist verbal cues.    Weekly Progress Updates: Pt has made great progress during skilled ST sessions and as a result he has met 4 of 5 STGs. Pt with specific progress in  diet advance and speech intelligibility at the word level. Pt continues to have decreased speech intelligibility at the phrase level and beyond which impacts his ability to effectively communicate his wants/needs. Therefore skilled ST continues to be indicated to address speech intelligibiliity goals.      Intensity: Minumum of 1-2 x/day, 30 to 90 minutes Frequency: 3 to 5 out of 7 days Duration/Length of Stay: 8/18 Treatment/Interventions: English as a second language teacher;Dysphagia/aspiration precaution training;Functional tasks;Internal/external aids;Multimodal communication approach;Patient/family education;Speech/Language facilitation   Daily Session  Skilled Therapeutic Interventions: Skilled treatment session focused on speech communication goals. SLP facilitated session by providing Max A to Mod A cues to utilize speech intelligibility strategies (specifically overtarticulation) to achieve ~ 75 % intelligibility at the phrase level during multiple structured barrier tasks. Pt was returned to room, left upright in wheelchair with all needs within reach. Continue per current plan of care.       Function:    Cognition Comprehension Comprehension assist level: Follows basic conversation/direction with no assist  Expression   Expression assist level: Expresses basic 50 - 74% of the time/requires cueing 25 - 49% of the time. Needs to repeat parts of sentences.  Social Interaction Social Interaction assist level: Interacts appropriately 90% of the time - Needs monitoring or encouragement for participation or interaction.  Problem Solving Problem solving assist level: Solves basic problems with no assist  Memory Memory assist level: More than reasonable amount of time  General    Pain Pain Assessment Pain Assessment: No/denies pain  Therapy/Group: Individual Therapy  Traye Bates 12/06/2016, 4:11 PM

## 2016-12-07 ENCOUNTER — Inpatient Hospital Stay (HOSPITAL_COMMUNITY): Payer: Medicare Other | Admitting: Speech Pathology

## 2016-12-07 NOTE — Progress Notes (Signed)
[place] PHYSICAL MEDICINE & REHABILITATION     PROGRESS NOTE  Subjective/Complaints:   Asking about CVA etiology  ROS: Denies CP, SOB, N/V/D.  Objective: Vital Signs: Blood pressure (!) 124/56, pulse 61, temperature 98.2 F (36.8 C), temperature source Oral, resp. rate 16, height 5\' 10"  (1.778 m), weight 87 kg (191 lb 12.3 oz), SpO2 99 %. No results found.  Recent Labs  12/05/16 0423  WBC 4.9  HGB 12.5*  HCT 38.1*  PLT 233    Recent Labs  12/05/16 0423  NA 138  K 3.5  CL 106  GLUCOSE 98  BUN 15  CREATININE 0.97  CALCIUM 8.6*   CBG (last 3)  No results for input(s): GLUCAP in the last 72 hours.  Wt Readings from Last 3 Encounters:  12/04/16 87 kg (191 lb 12.3 oz)  11/25/16 96.9 kg (213 lb 10 oz)  11/25/16 90.7 kg (200 lb)    Physical Exam:  BP (!) 124/56 (BP Location: Right Arm)   Pulse 61   Temp 98.2 F (36.8 C) (Oral)   Resp 16   Ht 5\' 10"  (1.778 m)   Wt 87 kg (191 lb 12.3 oz)   SpO2 99%   BMI 27.52 kg/m  Constitutional: He appears well-developedand well-nourished. No distress.  HENT: Normocephalicand atraumatic.  Eyes: EOMI. No discharge. Cardiovascular: RRR. No murmurheard. Respiratory: Effort normal breath sounds normal. GI: Soft. Bowel sounds are normal.  Musculoskeletal: He exhibits edema(LUE), no tenderness.  Neurological: He is alertand oriented Dysarthria.  Fair insight and awareness.  Left facial weakness  LUE: 0/5 proximal to distal  RUE/RLE: 4/5 proximal to distal LLE: 2/5 HF, KE, ADF/PF Skin: Skin is warmand dry. He is not diaphoretic.  Psychiatric:   Assessment/Plan: 1. Functional deficits secondary to embolic right MCA infarct which require 3+ hours per day of interdisciplinary therapy in a comprehensive inpatient rehab setting. Physiatrist is providing close team supervision and 24 hour management of active medical problems listed below. Physiatrist and rehab team continue to assess barriers to discharge/monitor  patient progress toward functional and medical goals.  Function:  Bathing Bathing position   Position: Shower  Bathing parts Body parts bathed by patient: Left arm, Chest, Abdomen, Front perineal area, Buttocks, Right upper leg, Left upper leg, Right lower leg, Left lower leg Body parts bathed by helper: Right arm  Bathing assist Assist Level: Touching or steadying assistance(Pt > 75%)      Upper Body Dressing/Undressing Upper body dressing   What is the patient wearing?: Pull over shirt/dress     Pull over shirt/dress - Perfomed by patient: Thread/unthread right sleeve, Thread/unthread left sleeve, Put head through opening Pull over shirt/dress - Perfomed by helper: Pull shirt over trunk        Upper body assist Assist Level: Touching or steadying assistance(Pt > 75%)      Lower Body Dressing/Undressing Lower body dressing   What is the patient wearing?: Underwear, Pants, Socks, Shoes Underwear - Performed by patient: Thread/unthread right underwear leg, Thread/unthread left underwear leg Underwear - Performed by helper: Pull underwear up/down Pants- Performed by patient: Thread/unthread right pants leg, Thread/unthread left pants leg Pants- Performed by helper: Pull pants up/down   Non-skid slipper socks- Performed by helper: Don/doff left sock, Don/doff right sock Socks - Performed by patient: Don/doff left sock, Don/doff right sock   Shoes - Performed by patient: Don/doff right shoe, Don/doff left shoe, Fasten right, Fasten left            Lower body  assist Assist for lower body dressing: Touching or steadying assistance (Pt > 75%)      Toileting Toileting   Toileting steps completed by patient: Adjust clothing prior to toileting, Performs perineal hygiene, Adjust clothing after toileting Toileting steps completed by helper: Adjust clothing prior to toileting Toileting Assistive Devices: Grab bar or rail  Toileting assist Assist level: Touching or steadying  assistance (Pt.75%)   Transfers Chair/bed transfer   Chair/bed transfer method: Stand pivot Chair/bed transfer assist level: Touching or steadying assistance (Pt > 75%) Chair/bed transfer assistive device: Armrests     Locomotion Ambulation     Max distance: 16175ft Assist level: Moderate assist (Pt 50 - 74%)   Wheelchair   Type: Manual Max wheelchair distance: 13075ft Assist Level: Supervision or verbal cues  Cognition Comprehension Comprehension assist level: Follows basic conversation/direction with no assist  Expression Expression assist level: Expresses basic 50 - 74% of the time/requires cueing 25 - 49% of the time. Needs to repeat parts of sentences.  Social Interaction Social Interaction assist level: Interacts appropriately 90% of the time - Needs monitoring or encouragement for participation or interaction.  Problem Solving Problem solving assist level: Solves basic problems with no assist  Memory Memory assist level: More than reasonable amount of time    Medical Problem List and Plan: 1. Left hemiparesis and speech/swallowing difficultiessecondary to embolic (?artery to artery)  right MCA infarct  Cont CIR PT, OT, SLP-2. DVT Prophylaxis/Anticoagulation: Pharmaceutical: Lovenox,Brillinta 3. Pain Management: tylenol prn  4. Mood: LCSW to follow for evaluation and support. Team to provide ego support--showing signs of adjustment reaction. 5. Neuropsych: This patient appears to be capable of making decisions on hisown behalf. 6. Skin/Wound Care: routine pressure relief measures.  7. Fluids/Electrolytes/Nutrition: Monitor I/Os  BMP within acceptable range on 7/28 8. Glaucoma: Continue Xalatan as at home. 9. ABLA:   Hb 12.5 on 8/2  Cont to monitor 10. Elevated triglycerides: Now on Lipitor.  11. HTN: Controlled 8/3   Vitals:   12/06/16 1500 12/07/16 0539  BP: 125/74 (!) 124/56  Pulse: 65 61  Resp:  16  Temp: 98.6 F (37 C) 98.2 F (36.8 C)   12. Dysphagia: pt  on D3 diet per acute SLP recs: pt very dysarthric with poor oro-motor control. Aspiration precautions- making slow improvements 13. Hypoalbuminemia  Supplement initiated 7/28  LOS (Days) 8 A FACE TO FACE EVALUATION WAS PERFORMED  KIRSTEINS,ANDREW E 12/07/2016 8:02 AM

## 2016-12-07 NOTE — Progress Notes (Signed)
Speech Language Pathology Daily Session Note  Patient Details  Name: Terrilee CroakJames Roediger MRN: [id number] Date of Birth: [birth year]  Today's Date: 12/07/2016 SLP Individual Time: 1330-1415 SLP Individual Time Calculation (min): 45 min  Short Term Goals: Week 2: SLP Short Term Goal 1 (Week 2): Pt will utilize overarticulation to achieve intelligibility at the phrase level with mod assist multimodal cues.   SLP Short Term Goal 2 (Week 2): Pt will communicate his immediate needs and wants via multimodal means with supervision verbal cues.   SLP Short Term Goal 3 (Week 2): Pt will recognize and correct verbal errors at the phrase level with mod assist verbal cues.    Skilled Therapeutic Interventions: Skilled treatment session focused on speech goals. SLP facilitated session by providing Mod A verbal cues for patient to self-monitor and correct articulatory errors and use of speech intelligibility strategies (specifically over-articulation) at the phrase and sentence level during a verbal description task. However, patient was Mod I for use of a slow rate of speech. Overall, patient was ~75% intelligible throughout session. Patient left sitting upright in wheelchair with visitor present. Continue with current plan of care.          Function:   Cognition Comprehension Comprehension assist level: Follows basic conversation/direction with no assist  Expression   Expression assist level: Expresses basic 50 - 74% of the time/requires cueing 25 - 49% of the time. Needs to repeat parts of sentences.  Social Interaction Social Interaction assist level: Interacts appropriately 90% of the time - Needs monitoring or encouragement for participation or interaction.  Problem Solving Problem solving assist level: Solves basic problems with no assist  Memory Memory assist level: More than reasonable amount of time    Pain No/Denies Pain   Therapy/Group: Individual Therapy  Elana Jian 12/07/2016, 2:38  PM

## 2016-12-08 ENCOUNTER — Inpatient Hospital Stay (HOSPITAL_COMMUNITY): Payer: Medicare Other

## 2016-12-08 NOTE — Progress Notes (Signed)
[facility] PHYSICAL MEDICINE & REHABILITATION     PROGRESS NOTE  Subjective/Complaints:   No issues overnite, discussed motor recovery  ROS: Denies CP, SOB, N/V/D.  Objective: Vital Signs: Blood pressure 112/81, pulse 75, temperature 97.9 F (36.6 C), temperature source Oral, resp. rate 16, height 5\' 10"  (1.778 m), weight 87 kg (191 lb 12.3 oz), SpO2 100 %. No results found. No results for input(s): WBC, HGB, HCT, PLT in the last 72 hours. No results for input(s): NA, K, CL, GLUCOSE, BUN, CREATININE, CALCIUM in the last 72 hours.  Invalid input(s): CO CBG (last 3)  No results for input(s): GLUCAP in the last 72 hours.  Wt Readings from Last 3 Encounters:  12/04/16 87 kg (191 lb 12.3 oz)  11/25/16 96.9 kg (213 lb 10 oz)  11/25/16 90.7 kg (200 lb)    Physical Exam:  BP 112/81 (BP Location: Right Arm)   Pulse 75   Temp 97.9 F (36.6 C) (Oral)   Resp 16   Ht 5\' 10"  (1.778 m)   Wt 87 kg (191 lb 12.3 oz)   SpO2 100%   BMI 27.52 kg/m  Constitutional: He appears well-developedand well-nourished. No distress.  HENT: Normocephalicand atraumatic.  Eyes: EOMI. No discharge. Cardiovascular: RRR. No murmurheard. Respiratory: Effort normal breath sounds normal. GI: Soft. Bowel sounds are normal.  Musculoskeletal: He exhibits edema(LUE), no tenderness.  Neurological: He is alertand oriented Dysarthria.  Fair insight and awareness.  Left facial weakness  LUE: 0/5 proximal to distal  RUE/RLE: 4/5 proximal to distal LLE: 2/5 HF, KE, ADF/PF Skin: Skin is warmand dry. He is not diaphoretic.  Psychiatric:   Assessment/Plan: 1. Functional deficits secondary to embolic right MCA infarct which require 3+ hours per day of interdisciplinary therapy in a comprehensive inpatient rehab setting. Physiatrist is providing close team supervision and 24 hour management of active medical problems listed below. Physiatrist and rehab team continue to assess barriers to discharge/monitor  patient progress toward functional and medical goals.  Function:  Bathing Bathing position   Position: Shower  Bathing parts Body parts bathed by patient: Left arm, Chest, Abdomen, Front perineal area, Buttocks, Right upper leg, Left upper leg, Right lower leg, Left lower leg Body parts bathed by helper: Right arm  Bathing assist Assist Level: Touching or steadying assistance(Pt > 75%)      Upper Body Dressing/Undressing Upper body dressing   What is the patient wearing?: Pull over shirt/dress     Pull over shirt/dress - Perfomed by patient: Thread/unthread right sleeve, Thread/unthread left sleeve, Put head through opening Pull over shirt/dress - Perfomed by helper: Pull shirt over trunk        Upper body assist Assist Level: Touching or steadying assistance(Pt > 75%)      Lower Body Dressing/Undressing Lower body dressing   What is the patient wearing?: Underwear, Pants, Socks, Shoes Underwear - Performed by patient: Thread/unthread right underwear leg, Thread/unthread left underwear leg Underwear - Performed by helper: Pull underwear up/down Pants- Performed by patient: Thread/unthread right pants leg, Thread/unthread left pants leg Pants- Performed by helper: Pull pants up/down   Non-skid slipper socks- Performed by helper: Don/doff left sock, Don/doff right sock Socks - Performed by patient: Don/doff left sock, Don/doff right sock   Shoes - Performed by patient: Don/doff right shoe, Don/doff left shoe, Fasten right, Fasten left            Lower body assist Assist for lower body dressing: Touching or steadying assistance (Pt > 75%)  Toileting Toileting   Toileting steps completed by patient: Adjust clothing prior to toileting, Performs perineal hygiene, Adjust clothing after toileting Toileting steps completed by helper: Adjust clothing prior to toileting, Performs perineal hygiene, Adjust clothing after toileting Toileting Assistive Devices: Grab bar or rail   Toileting assist Assist level: Touching or steadying assistance (Pt.75%)   Transfers Chair/bed transfer   Chair/bed transfer method: Stand pivot Chair/bed transfer assist level: Touching or steadying assistance (Pt > 75%) Chair/bed transfer assistive device: Armrests     Locomotion Ambulation     Max distance: 14675ft Assist level: Moderate assist (Pt 50 - 74%)   Wheelchair   Type: Manual Max wheelchair distance: 11975ft Assist Level: Supervision or verbal cues  Cognition Comprehension Comprehension assist level: Understands basic 90% of the time/cues < 10% of the time  Expression Expression assist level: Expresses basic 50 - 74% of the time/requires cueing 25 - 49% of the time. Needs to repeat parts of sentences.  Social Interaction Social Interaction assist level: Interacts appropriately 50 - 74% of the time - May be physically or verbally inappropriate.  Problem Solving Problem solving assist level: Solves basic 50 - 74% of the time/requires cueing 25 - 49% of the time  Memory Memory assist level: Recognizes or recalls 50 - 74% of the time/requires cueing 25 - 49% of the time    Medical Problem List and Plan: 1. Left hemiparesis and speech/swallowing difficultiessecondary to embolic (?artery to artery)  right MCA infarct  Cont CIR PT, OT, SLP-2. DVT Prophylaxis/Anticoagulation: Pharmaceutical: Lovenox,Brillinta 3. Pain Management: tylenol prn  4. Mood: LCSW to follow for evaluation and support. Team to provide ego support--showing signs of adjustment reaction. 5. Neuropsych: This patient appears to be capable of making decisions on hisown behalf. 6. Skin/Wound Care: routine pressure relief measures.  7. Fluids/Electrolytes/Nutrition: Monitor I/Os  BMP within acceptable range on 7/28 8. Glaucoma: Continue Xalatan as at home. 9. ABLA:   Hb 12.5 on 8/2  Cont to monitor 10. Elevated triglycerides: Now on Lipitor.  11. HTN: Controlled 8/3   Vitals:   12/07/16 1629 12/08/16  0606  BP: (!) 131/56 112/81  Pulse: 76 75  Resp: 18 16  Temp: 98.4 F (36.9 C) 97.9 F (36.6 C)   12. Dysphagia: pt on D3 diet per acute SLP recs: pt very dysarthric with poor oro-motor control. Aspiration precautions- making slow improvements 13. Hypoalbuminemia  Supplement initiated 7/28  LOS (Days) 9 A FACE TO FACE EVALUATION WAS PERFORMED  Erick ColaceKIRSTEINS,Sereniti Wan E 12/08/2016 7:56 AM

## 2016-12-08 NOTE — Progress Notes (Signed)
Occupational Therapy Session Note  Patient Details  Name: Jason Nolan MRN: [id number] Date of Birth: Sep 19, 1945  Today's Date: 12/08/2016 OT Individual Time: 1600-1700 OT Individual Time Calculation (min): 60 min    Short Term Goals: Week 1:  OT Short Term Goal 1 (Week 1): Pt will perform shower transfer with mod A. OT Short Term Goal 2 (Week 1): Pt will perform UB dressing with min A in order to decrease level of assist with self care. OT Short Term Goal 3 (Week 1): Pt will display min A standing balance during LB clothing management.  Skilled Therapeutic Interventions/Progress Updates:    1:1. No c/o pain. Focus of session on LUE AAROM, weight bearing, and standing balance without AD. Pt completes 2x15 shoulder felx/ext, horizontal ab/add, int/ext rotation and elow flex/ext on UE ranger with support at elbow and VC for slow controlled movements. Pt stands at high low table to weight bear through LUE and complete towel glides with supervision of 1x15 for/backward, horizontal, circles, V's, and diagonals. Pt stands with supervision to match cards on wall crossing midline to obtain and reach anteriorly to place on board to improve balance required for ADLs. Pt with 1 LOB L when crossing midline with min A to recover. Pt assembles PVC figure according to picture in standing with step under R leg to facilitate weight shift onto L with supervision and min question cueing to identify mistake. Exited session with pt seated in w/c with call light in reach and family in room.   Therapy Documentation Precautions:  Precautions Precautions: Fall Restrictions Weight Bearing Restrictions: No General:   Vital Signs:  See Function Navigator for Current Functional Status.   Therapy/Group: Individual Therapy  Shon HaleStephanie M Jekhi Bolin 12/08/2016, 5:56 PM

## 2016-12-09 ENCOUNTER — Inpatient Hospital Stay (HOSPITAL_COMMUNITY): Payer: Medicare Other | Admitting: Occupational Therapy

## 2016-12-09 ENCOUNTER — Inpatient Hospital Stay (HOSPITAL_COMMUNITY): Payer: Medicare Other | Admitting: Speech Pathology

## 2016-12-09 ENCOUNTER — Inpatient Hospital Stay (HOSPITAL_COMMUNITY): Payer: Medicare Other | Admitting: Physical Therapy

## 2016-12-09 NOTE — Progress Notes (Signed)
Speech Language Pathology Daily Session Note  Patient Details  Name: Jason Nolan MRN: [id number] Date of Birth: 01/13/1946  Today's Date: 12/09/2016 SLP Individual Time: 1300-1330 SLP Individual Time Calculation (min): 30 min  Short Term Goals: Week 2: SLP Short Term Goal 1 (Week 2): Pt will utilize overarticulation to achieve intelligibility at the phrase level with mod assist multimodal cues.   SLP Short Term Goal 2 (Week 2): Pt will communicate his immediate needs and wants via multimodal means with supervision verbal cues.   SLP Short Term Goal 3 (Week 2): Pt will recognize and correct verbal errors at the phrase level with mod assist verbal cues.    Skilled Therapeutic Interventions: Skilled treatment session focused on speech goals. SLP facilitated session by providing Min A verbal cues for use of speech intelligibility strategies during a verbal description task to achieve ~80% intelligibility at the word and phrase level. Patient left upright in wheelchair with all needs within reach. Continue with current plan of care.      Function:    Cognition Comprehension Comprehension assist level: Understands basic 90% of the time/cues < 10% of the time  Expression   Expression assist level: Expresses basic 90% of the time/requires cueing < 10% of the time.  Social Interaction Social Interaction assist level: Interacts appropriately 90% of the time - Needs monitoring or encouragement for participation or interaction.  Problem Solving Problem solving assist level: Solves basic 90% of the time/requires cueing < 10% of the time  Memory Memory assist level: Recognizes or recalls 90% of the time/requires cueing < 10% of the time    Pain Pain Assessment Pain Assessment: No/denies pain Pain Score: 0-No pain  Therapy/Group: Individual Therapy  Leighann Amadon 12/09/2016, 3:16 PM

## 2016-12-09 NOTE — Progress Notes (Signed)
Physical Therapy Session Note  Patient Details  Name: Jason CroakJames Nolan MRN: [id number] Date of Birth: 10/04/1945  Today's Date: 12/09/2016 PT Individual Time: 1530-1630 PT Individual Time Calculation (min): 60 min   Short Term Goals: Week 2:  PT Short Term Goal 1 (Week 2): Pt will ambulate 12150ft with LRAD with min A in controlled environment  PT Short Term Goal 2 (Week 2): Pt will ascend/descend 4 steps with min A  PT Short Term Goal 3 (Week 2): Pt will initiate gait training in the community PT Short Term Goal 4 (Week 2): Pt will be min A with w/c mobility in community environment  Skilled Therapeutic Interventions/Progress Updates:    no c/o pain. PT treatment session focused on L LE NMR and strengthening, ambulation, and stepping strategy.  Pt sitting in w/c upon arrival, agreeable to PT. PT rolled pt in w/c to/from gym for energy conservation and time. Ambulated 11700ft with RW with min A for balance with verbal cues for L LE step length and foot clearance and tactile cues for weight shifting. In parallel bars, pt performed hip hikes 2 sets to fatigue for strengthening L LE hip abductors with B UE support with facilitation for pelvic movement with verbal and tactile cues for upright posture and hip extension. In parallel bars, pt performed lateral L LE step ups (4 inch step) 2 sets to fatigue with B UE support progressing to R UE touching support with tactile cues for knee and hip extension while blocking hyperextension. In [place] pt performed stepping strategy forward and laterally with min A for steadying and occasional mod A for L LOB. In [place], ambulated down/back length of track 4 times with min A for steadying and occasional mod A with anterior LOB and tactile cues for for weight shifting. Applied toe cap to L LE while ambulating in [place] with increased step length. Pt left sitting in w/c with call bell in reach.   Therapy Documentation Precautions:  Precautions Precautions:  Fall Restrictions Weight Bearing Restrictions: No  See Function Navigator for Current Functional Status.   Therapy/Group: Individual Therapy  Aberdeen Hafen 12/09/2016, 5:41 PM

## 2016-12-09 NOTE — Progress Notes (Signed)
Occupational Therapy Weekly Progress Note  Patient Details  Name: Jason Nolan MRN: [id number] Date of Birth: 10-08-1945  Beginning of progress report period: November 30, 2016 End of progress report period: December 09, 2016  Today's Date: 12/09/2016 OT Individual Time: 1400-1500 OT Individual Time Calculation (min): 60 min   Patient has met 3 of 3 short term goals.  Pt is making steady progress with OT treatments at this time.  He is able to complete all transfers with overall min A. LUE function continues to improve with improved proximal movement.  He continues to need verbal cues for awareness of L UE and to position UE prior to transferring or standing. Will continue with current OT treatment POC.   Patient continues to demonstrate the following deficits: muscle weakness, abnormal tone and unbalanced muscle activation, decreased attention to left and decreased standing balance, decreased postural control, hemiplegia and decreased balance strategies and therefore will continue to benefit from skilled OT intervention to enhance overall performance with BADL.  Patient progressing toward long term goals..  Continue plan of care.  OT Short Term Goals Week 1:  OT Short Term Goal 1 (Week 1): Pt will perform shower transfer with mod A. OT Short Term Goal 1 - Progress (Week 1): Met OT Short Term Goal 2 (Week 1): Pt will perform UB dressing with min A in order to decrease level of assist with self care. OT Short Term Goal 2 - Progress (Week 1): Met OT Short Term Goal 3 (Week 1): Pt will display min A standing balance during LB clothing management. OT Short Term Goal 3 - Progress (Week 1): Met Week 2:  OT Short Term Goal 1 (Week 2): Pt will utilize L UE as a stabilizer with min questioning cues OT Short Term Goal 2 (Week 2): Pt will complete L UE self-ROM without assistance OT Short Term Goal 3 (Week 2): Pt will maintain standing balance with 50% supervision while standing to pull up pants.   Skilled  Therapeutic Interventions/Progress Updates:    OT treatment session focused on standing balance, L NMR, and NMES. Pt reported need for bathroom, propelled wc to bathroom with min A to maneuver obstacles. Sit<>stand at commode with close supervision, then able to manage clothing and void bladder with supervision as well. 1:1 NMES applied to wrist extensors, supraspinatus and middle deltoid.   Ratio 1:1 Rate 35 pps Waveform- Asymmetric Ramp 1.0 Pulse 300 Intensity- 24 Duration - 15  No adverse reactions after treatment and is skin intact.   L NMR focused on shoulder flex/ext, scapula  protraction/retraction, elbow flex/ext, forearm pronation/supination, wrist flex/ext, and finger flex/ext. Pt returned to room at end of session and left seated in wc with needs met.   Therapy Documentation Precautions:  Precautions Precautions: Fall Restrictions Weight Bearing Restrictions: No Pain: Pain Assessment Pain Assessment: No/denies pain Pain Score: 0-No pain  See Function Navigator for Current Functional Status.   Therapy/Group: Individual Therapy  Valma Cava 12/09/2016, 2:46 PM

## 2016-12-09 NOTE — Progress Notes (Signed)
Occupational Therapy Session Note  Patient Details  Name: Jason Nolan MRN: [id number] Date of Birth: November 25, 1945  Today's Date: 12/09/2016 OT Individual Time: [id number] OT Individual Time Calculation (min): 50 min    Short Term Goals: Week 1:  OT Short Term Goal 1 (Week 1): Pt will perform shower transfer with mod A. OT Short Term Goal 2 (Week 1): Pt will perform UB dressing with min A in order to decrease level of assist with self care. OT Short Term Goal 3 (Week 1): Pt will display min A standing balance during LB clothing management.  Skilled Therapeutic Interventions/Progress Updates:    Tx focus on [name], functional transfers, and standing balance during self care completion.   Pt greeted [name], just finishing up breakfast. Requesting shower. Pt completed stand pivot transfer<w/c>TTB with Min A for keeping L UE supported/safe. Pt instructed on setup of shower including temperature controls with carryover of education with min cues throughout. Pt donning Lt bath mitt himself, using L UE with AAROM for thoroughly washing R UE and upper legs. OT assisted with maintaining functional grasp on grab bar during sit<stand transitions. Pt weightbearing through Lt hand during pericare completion, requiring Min A from OT for steadying. Afterwards pt transferred back to w/c and proceeded to dress at sink. Pt initiating use of hemi techniques, utilizing shoe buttons for fastening sneakers. Pt requiring steady assist overall. Min [facility] assist for incorporating L UE during grooming tasks. At end of tx pt was left in w/c with all needs within reach.   Therapy Documentation Precautions:  Precautions Precautions: Fall Restrictions Weight Bearing Restrictions: No  Pain: No c/o pain during tx    ADL:      See Function Navigator for Current Functional Status.   Therapy/Group: Individual Therapy  Lenoria Narine A Chauna Osoria 12/09/2016, 12:25 PM

## 2016-12-09 NOTE — Progress Notes (Signed)
[place] PHYSICAL MEDICINE & REHABILITATION     PROGRESS NOTE  Subjective/Complaints:   No issues overnite   ROS: Denies CP, SOB, N/V/D.  Objective: Vital Signs: Blood pressure (!) 146/78, pulse 87, temperature 97.9 F (36.6 C), temperature source Oral, resp. rate 16, height 5\' 10"  (1.778 m), weight 87 kg (191 lb 12.3 oz), SpO2 99 %. No results found. No results for input(s): WBC, HGB, HCT, PLT in the last 72 hours. No results for input(s): NA, K, CL, GLUCOSE, BUN, CREATININE, CALCIUM in the last 72 hours.  Invalid input(s): CO CBG (last 3)  No results for input(s): GLUCAP in the last 72 hours.  Wt Readings from Last 3 Encounters:  12/04/16 87 kg (191 lb 12.3 oz)  11/25/16 96.9 kg (213 lb 10 oz)  11/25/16 90.7 kg (200 lb)    Physical Exam:  BP (!) 146/78 (BP Location: Right Arm)   Pulse 87   Temp 97.9 F (36.6 C) (Oral)   Resp 16   Ht 5\' 10"  (1.778 m)   Wt 87 kg (191 lb 12.3 oz)   SpO2 99%   BMI 27.52 kg/m  Constitutional: He appears well-developedand well-nourished. No distress.  HENT: Normocephalicand atraumatic.  Eyes: EOMI. No discharge. Cardiovascular: RRR. No murmurheard. Respiratory: Effort normal breath sounds normal. GI: Soft. Bowel sounds are normal.  Musculoskeletal: He exhibits edema(LUE), no tenderness.  Neurological: He is alertand oriented Dysarthria.  Fair insight and awareness.  Left facial weakness  LUE: 2-/5 biceps and finger flexors, 0/5 L finger ext RUE/RLE: 4/5 proximal to distal LLE: 2/5 HF, KE, ADF/PF Skin: Skin is warmand dry. He is not diaphoretic.  Psychiatric:   Assessment/Plan: 1. Functional deficits secondary to embolic right MCA infarct which require 3+ hours per day of interdisciplinary therapy in a comprehensive inpatient rehab setting. Physiatrist is providing close team supervision and 24 hour management of active medical problems listed below. Physiatrist and rehab team continue to assess barriers to  discharge/monitor patient progress toward functional and medical goals.  Function:  Bathing Bathing position   Position: Wheelchair/chair at sink  Bathing parts Body parts bathed by patient: Left lower leg, Left arm, Abdomen, Chest, Front perineal area, Buttocks, Right lower leg, Left upper leg, Right upper leg Body parts bathed by helper: Back, Right arm  Bathing assist Assist Level: Touching or steadying assistance(Pt > 75%)      Upper Body Dressing/Undressing Upper body dressing   What is the patient wearing?: Pull over shirt/dress     Pull over shirt/dress - Perfomed by patient: Thread/unthread right sleeve, Thread/unthread left sleeve, Put head through opening, Pull shirt over trunk Pull over shirt/dress - Perfomed by helper: Pull shirt over trunk        Upper body assist Assist Level: Touching or steadying assistance(Pt > 75%)      Lower Body Dressing/Undressing Lower body dressing   What is the patient wearing?: Underwear, Pants, Socks, Shoes Underwear - Performed by patient: Thread/unthread right underwear leg, Thread/unthread left underwear leg, Pull underwear up/down Underwear - Performed by helper: Pull underwear up/down Pants- Performed by patient: Thread/unthread right pants leg, Thread/unthread left pants leg, Pull pants up/down Pants- Performed by helper: Pull pants up/down Non-skid slipper socks- Performed by patient: Don/doff right sock, Don/doff left sock Non-skid slipper socks- Performed by helper: Don/doff left sock, Don/doff right sock Socks - Performed by patient: Don/doff left sock, Don/doff right sock   Shoes - Performed by patient: Don/doff right shoe, Don/doff left shoe  Lower body assist Assist for lower body dressing: Touching or steadying assistance (Pt > 75%)      Toileting Toileting   Toileting steps completed by patient: Adjust clothing prior to toileting, Performs perineal hygiene, Adjust clothing after toileting Toileting  steps completed by helper: Adjust clothing prior to toileting, Performs perineal hygiene, Adjust clothing after toileting Toileting Assistive Devices: Grab bar or rail  Toileting assist Assist level: Touching or steadying assistance (Pt.75%)   Transfers Chair/bed transfer   Chair/bed transfer method: Stand pivot Chair/bed transfer assist level: Touching or steadying assistance (Pt > 75%) Chair/bed transfer assistive device: Armrests     Locomotion Ambulation     Max distance: 13275ft Assist level: Moderate assist (Pt 50 - 74%)   Wheelchair   Type: Manual Max wheelchair distance: 12775ft Assist Level: Supervision or verbal cues  Cognition Comprehension Comprehension assist level: Understands basic 90% of the time/cues < 10% of the time  Expression Expression assist level: Expresses basic 90% of the time/requires cueing < 10% of the time.  Social Interaction Social Interaction assist level: Interacts appropriately 90% of the time - Needs monitoring or encouragement for participation or interaction.  Problem Solving Problem solving assist level: Solves basic 90% of the time/requires cueing < 10% of the time  Memory Memory assist level: Recognizes or recalls 90% of the time/requires cueing < 10% of the time    Medical Problem List and Plan: 1. Left hemiparesis and speech/swallowing difficultiessecondary to embolic (?artery to artery)  right MCA infarct  Cont CIR PT, OT, SLP- Some increasing LUE strength       2. DVT Prophylaxis/Anticoagulation: Pharmaceutical: Lovenox,Brillinta 3. Pain Management: tylenol prn  4. Mood: LCSW to follow for evaluation and support. Team to provide ego support--showing signs of adjustment reaction. 5. Neuropsych: This patient appears to be capable of making decisions on hisown behalf. 6. Skin/Wound Care: routine pressure relief measures.  7. Fluids/Electrolytes/Nutrition: Monitor I/Os  BMP within acceptable range on 7/28 8. Glaucoma: Continue Xalatan as  at home. 9. ABLA:   Hb 12.5 on 8/2  Cont to monitor 10. Elevated triglycerides: Now on Lipitor.  11. HTN: Controlled 8/6 except mildly elevated systolic   Vitals:   12/08/16 [id number] 12/09/16 0531  BP: 137/60 (!) 146/78  Pulse: 79 87  Resp: 18 16  Temp: 98 F (36.7 C) 97.9 F (36.6 C)   12. Dysphagia: pt on D3 diet per acute SLP recs: pt very dysarthric with poor oro-motor control. Aspiration precautions- making slow improvements 13. Hypoalbuminemia  Supplement initiated 7/28  LOS (Days) 10 A FACE TO FACE EVALUATION WAS PERFORMED  Erick ColaceKIRSTEINS,Kelley Knoth E 12/09/2016 7:29 AM

## 2016-12-10 ENCOUNTER — Inpatient Hospital Stay (HOSPITAL_COMMUNITY): Payer: Medicare Other | Admitting: Occupational Therapy

## 2016-12-10 ENCOUNTER — Inpatient Hospital Stay (HOSPITAL_COMMUNITY): Payer: Medicare Other

## 2016-12-10 NOTE — Progress Notes (Signed)
Speech Language Pathology Daily Session Note  Patient Details  Name: Jason Nolan Bamburg MRN: [id number] Date of Birth: 1946/03/23  Today's Date: 12/10/2016 SLP Individual Time: 1000-1045 SLP Individual Time Calculation (min): 45 min  Short Term Goals: Week 2: SLP Short Term Goal 1 (Week 2): Pt will utilize overarticulation to achieve intelligibility at the phrase level with mod assist multimodal cues.   SLP Short Term Goal 2 (Week 2): Pt will communicate his immediate needs and wants via multimodal means with supervision verbal cues.   SLP Short Term Goal 3 (Week 2): Pt will recognize and correct verbal errors at the phrase level with mod assist verbal cues.    Skilled Therapeutic Interventions: Skilled ST treatment focused on speech goals. SLP facilitated session by providing Min A verbal cues for use of speech intelligibility strategies (slow down, pronounce each syllable.Marland Kitchen.etc) during conversation with 75% intelligibility at sentence level and in structured verbal description and naming tasks with 85% intelligibility at the word and phrase level. Pt was left in room with call bell within reach. Recommend to continue ST services.      Function:  Cognition Comprehension Comprehension assist level: Understands basic 90% of the time/cues < 10% of the time  Expression   Expression assist level: Expresses basic 90% of the time/requires cueing < 10% of the time.  Social Interaction Social Interaction assist level: Interacts appropriately with others - No medications needed.  Problem Solving Problem solving assist level: Solves basic 90% of the time/requires cueing < 10% of the time  Memory Memory assist level: Recognizes or recalls 90% of the time/requires cueing < 10% of the time    Pain Pain Assessment Pain Assessment: No/denies pain  Therapy/Group: Individual Therapy  Jermesha Sottile  [facility] 12/10/2016, 12:36 PM

## 2016-12-10 NOTE — Progress Notes (Signed)
Occupational Therapy Session Note  Patient Details  Name: Jason Nolan MRN: [id number] Date of Birth: Sep 15, 1945  Today's Date: 12/10/2016 OT Individual Time: 0800-0900 OT Individual Time Calculation (min): 60 min   Short Term Goals: Week 2:  OT Short Term Goal 1 (Week 2): Pt will utilize L UE as a stabilizer with min questioning cues OT Short Term Goal 2 (Week 2): Pt will complete L UE self-ROM without assistance OT Short Term Goal 3 (Week 2): Pt will maintain standing balance with 50% supervision while standing to pull up pants.   Skilled Therapeutic Interventions/Progress Updates:    OT treatment session focused on self-feeding modifications, functional use of L UE, and modified bathing/dressing. Pt seated at [name] to eat breakfast. Educated pt on use of L UE to stabilize plate and food objects. Pt took appropriate sized bites and was able to open containers using modified techniques. Pt transferred to wc stand-pivot with close supervision to R. Pt transferred to tub bench to L with close supervision and grab bars. L NMR incorporated within bathing tasks using wash mit and guided techniques for weight bear. Toothbrushing task completed in standing, guided techniques to facilitate normal movement patterns of L UE to reach and turn on/off water. Pt left seated in wc at end of session with call bell and needs met.   Therapy Documentation Precautions:  Precautions Precautions: Fall Restrictions Weight Bearing Restrictions: No Pain:  none/denies pain  See Function Navigator for Current Functional Status.   Therapy/Group: Individual Therapy  Valma Cava 12/10/2016, 8:06 AM

## 2016-12-10 NOTE — Progress Notes (Signed)
Physical Therapy Session Note  Patient Details  Name: Jason Nolan MRN: [id number] Date of Birth: October 06, 1945  Today's Date: 12/10/2016 PT Individual Time: 1415-1516 PT Individual Time Calculation (min): 61 min   Short Term Goals: Week 2:  PT Short Term Goal 1 (Week 2): Pt will ambulate 16850ft with LRAD with min A in controlled environment  PT Short Term Goal 2 (Week 2): Pt will ascend/descend 4 steps with min A  PT Short Term Goal 3 (Week 2): Pt will initiate gait training in the community PT Short Term Goal 4 (Week 2): Pt will be min A with w/c mobility in community environment  Skilled Therapeutic Interventions/Progress Updates:    Pt seated in w/c upon PT arrival, agreeable to therapy tx. Pt propelled w/c to the gym using B LEs with supervision. Pt ambulated x 30 ft using RW and min assist, verbal cues for toe clearance and heel strike on L LE. Trialed using DF ace wrap to improve toe clearance, pt ambulated x 165 ft using litegait for support on treadmill, x 1 trial without UE support and x 1 trial with UE support on speed 8 with facilitation for weightshifting and verbal cues for step length bilaterally. Pt ambulated using RW x 45 ft with min assist to assess for carry over, pt with improved symmetrical step length. Worked on L LE stepping over cane on floor forwards and backwards x 10, pt used railing for R UE support, verbal cues for toe clearance and heel stike with facilitation to L quad for extension in stance phase. Trial using quad cane for ambulation to work on dynamic balance and LE control, pt ambulated x 15 ft with min assist, verbal cues for sequencing and tactile cue 50 % of the time for knee extension during stance. Pt left seated in w/c with needs in reach at end of session.   Therapy Documentation Precautions:  Precautions Precautions: Fall Restrictions Weight Bearing Restrictions: No    See Function Navigator for Current Functional Status.   Therapy/Group:  Individual Therapy  Cresenciano GenreEmily van Schagen, PT, DPT 12/10/2016, 12:15 PM

## 2016-12-10 NOTE — Progress Notes (Signed)
Occupational Therapy Session Note  Patient Details  Name: Jason Nolan MRN: [id number] Date of Birth: Oct 10, 1945  Today's Date: 12/10/2016 OT Individual Time: 1130-1200 OT Individual Time Calculation (min): 30 min    Short Term Goals: Week 1:  OT Short Term Goal 1 (Week 1): Pt will perform shower transfer with mod A. OT Short Term Goal 1 - Progress (Week 1): Met OT Short Term Goal 2 (Week 1): Pt will perform UB dressing with min A in order to decrease level of assist with self care. OT Short Term Goal 2 - Progress (Week 1): Met OT Short Term Goal 3 (Week 1): Pt will display min A standing balance during LB clothing management. OT Short Term Goal 3 - Progress (Week 1): Met Week 2:  OT Short Term Goal 1 (Week 2): Pt will utilize L UE as a stabilizer with min questioning cues OT Short Term Goal 2 (Week 2): Pt will complete L UE self-ROM without assistance OT Short Term Goal 3 (Week 2): Pt will maintain standing balance with 50% supervision while standing to pull up pants.   Skilled Therapeutic Interventions/Progress Updates:    1:1 NMR with focus on functional movement of left Ue including performing D1/D2 PNF with min A to assist against gravity in seated position as well as weight bearing with functional reach.  Pt continues to have increased proximal strength compared to distal control and strength. Able to perform stand pivot transfers with steadying A.   Therapy Documentation Precautions:  Precautions Precautions: Fall Restrictions Weight Bearing Restrictions: No Pain: Pain Assessment Pain Assessment: No/denies pain  See Function Navigator for Current Functional Status.   Therapy/Group: Individual Therapy  Willeen Cass [facility] 12/10/2016, 4:23 PM

## 2016-12-10 NOTE — Progress Notes (Signed)
Normal PHYSICAL MEDICINE & REHABILITATION     PROGRESS NOTE  Subjective/Complaints:   No issues  ROS: Denies CP, SOB, N/V/D.  Objective: Vital Signs: Blood pressure 138/71, pulse 68, temperature 97.9 F (36.6 C), temperature source Oral, resp. rate 18, height 5\' 10"  (1.778 m), weight 87 kg (191 lb 12.3 oz), SpO2 96 %. No results found. No results for input(s): WBC, HGB, HCT, PLT in the last 72 hours. No results for input(s): NA, K, CL, GLUCOSE, BUN, CREATININE, CALCIUM in the last 72 hours.  Invalid input(s): CO CBG (last 3)  No results for input(s): GLUCAP in the last 72 hours.  Wt Readings from Last 3 Encounters:  12/04/16 87 kg (191 lb 12.3 oz)  11/25/16 96.9 kg (213 lb 10 oz)  11/25/16 90.7 kg (200 lb)    Physical Exam:  BP 138/71 (BP Location: Right Arm)   Pulse 68   Temp 97.9 F (36.6 C) (Oral)   Resp 18   Ht 5\' 10"  (1.778 m)   Wt 87 kg (191 lb 12.3 oz)   SpO2 96%   BMI 27.52 kg/m  Constitutional: He appears well-developedand well-nourished. No distress.  HENT: Normocephalicand atraumatic.  Eyes: EOMI. No discharge. Cardiovascular: RRR. No murmurheard. Respiratory: Effort normal breath sounds normal. GI: Soft. Bowel sounds are normal.  Musculoskeletal: He exhibits edema(LUE), no tenderness.  Neurological: He is alertand oriented Dysarthria.  Fair insight and awareness.  Left facial weakness  LUE: 2-/5 biceps and finger flexors, 0/5 L finger ext RUE/RLE: 4/5 proximal to distal LLE: 2/5 HF, KE, ADF/PF Skin: Skin is warmand dry. He is not diaphoretic.  Psychiatric:   Assessment/Plan: 1. Functional deficits secondary to embolic right MCA infarct which require 3+ hours per day of interdisciplinary therapy in a comprehensive inpatient rehab setting. Physiatrist is providing close team supervision and 24 hour management of active medical problems listed below. Physiatrist and rehab team continue to assess barriers to discharge/monitor patient  progress toward functional and medical goals.  Function:  Bathing Bathing position   Position: Shower  Bathing parts Body parts bathed by patient: Left lower leg, Left arm, Abdomen, Chest, Front perineal area, Buttocks, Right lower leg, Left upper leg, Right upper leg, Right arm Body parts bathed by helper: Back  Bathing assist Assist Level: Touching or steadying assistance(Pt > 75%)      Upper Body Dressing/Undressing Upper body dressing   What is the patient wearing?: Pull over shirt/dress     Pull over shirt/dress - Perfomed by patient: Thread/unthread right sleeve, Thread/unthread left sleeve, Put head through opening, Pull shirt over trunk Pull over shirt/dress - Perfomed by helper: Pull shirt over trunk        Upper body assist Assist Level: Supervision or verbal cues      Lower Body Dressing/Undressing Lower body dressing   What is the patient wearing?: Underwear, Pants, Socks, Shoes Underwear - Performed by patient: Thread/unthread right underwear leg, Thread/unthread left underwear leg, Pull underwear up/down Underwear - Performed by helper: Pull underwear up/down Pants- Performed by patient: Thread/unthread right pants leg, Thread/unthread left pants leg, Pull pants up/down Pants- Performed by helper: Pull pants up/down Non-skid slipper socks- Performed by patient: Don/doff right sock, Don/doff left sock Non-skid slipper socks- Performed by helper: Don/doff left sock, Don/doff right sock Socks - Performed by patient: Don/doff left sock, Don/doff right sock   Shoes - Performed by patient: Don/doff right shoe, Don/doff left shoe            Lower body assist  Assist for lower body dressing: Touching or steadying assistance (Pt > 75%)      Toileting Toileting   Toileting steps completed by patient: Performs perineal hygiene Toileting steps completed by helper: Adjust clothing prior to toileting, Adjust clothing after toileting Toileting Assistive Devices: Grab  bar or rail  Toileting assist Assist level: Touching or steadying assistance (Pt.75%)   Transfers Chair/bed transfer   Chair/bed transfer method: Stand pivot Chair/bed transfer assist level: Touching or steadying assistance (Pt > 75%) Chair/bed transfer assistive device: Armrests     Locomotion Ambulation     Max distance: 14400ft Assist level: Touching or steadying assistance (Pt > 75%)   Wheelchair   Type: Manual Max wheelchair distance: 1675ft Assist Level: Supervision or verbal cues  Cognition Comprehension Comprehension assist level: Understands basic 90% of the time/cues < 10% of the time  Expression Expression assist level: Expresses basic 90% of the time/requires cueing < 10% of the time.  Social Interaction Social Interaction assist level: Interacts appropriately 90% of the time - Needs monitoring or encouragement for participation or interaction.  Problem Solving Problem solving assist level: Solves basic 90% of the time/requires cueing < 10% of the time  Memory Memory assist level: Recognizes or recalls 90% of the time/requires cueing < 10% of the time    Medical Problem List and Plan: 1. Left hemiparesis and speech/swallowing difficultiessecondary to embolic (?artery to artery)  right MCA infarct  Cont CIR PT, OT, SLP- Some increasing LUE strength       2. DVT Prophylaxis/Anticoagulation: Pharmaceutical: Lovenox,Brillinta 3. Pain Management: tylenol prn  4. Mood: LCSW to follow for evaluation and support. Team to provide ego support--showing signs of adjustment reaction. 5. Neuropsych: This patient appears to be capable of making decisions on hisown behalf. 6. Skin/Wound Care: routine pressure relief measures.  7. Fluids/Electrolytes/Nutrition: Monitor I/Os  BMP within acceptable range on 7/28 8. Glaucoma: Continue Xalatan as at home. 9. ABLA:   Hb 12.5 on 8/2  Cont to monitor 10. Elevated triglycerides: Now on Lipitor.  11. HTN: Controlled 8/7 except mildly  elevated systolic   Vitals:   12/09/16 [id number] 12/10/16 0400  BP: 137/72 138/71  Pulse: 77 68  Resp: 16 18  Temp: 98.3 F (36.8 C) 97.9 F (36.6 C)   12. Dysphagia: pt on D3 diet per acute SLP recs: pt very dysarthric with poor oro-motor control. Aspiration precautions- making slow improvements 13. Hypoalbuminemia  Supplement initiated 7/28  LOS (Days) 11 A FACE TO FACE EVALUATION WAS PERFORMED  Erick ColaceKIRSTEINS,Leverett Camplin E 12/10/2016 7:29 AM

## 2016-12-11 ENCOUNTER — Inpatient Hospital Stay (HOSPITAL_COMMUNITY): Payer: Medicare Other

## 2016-12-11 ENCOUNTER — Inpatient Hospital Stay (HOSPITAL_COMMUNITY): Payer: Medicare Other | Admitting: Speech Pathology

## 2016-12-11 ENCOUNTER — Inpatient Hospital Stay (HOSPITAL_COMMUNITY): Payer: Medicare Other | Admitting: Occupational Therapy

## 2016-12-11 ENCOUNTER — Ambulatory Visit (HOSPITAL_COMMUNITY): Payer: Medicare Other | Admitting: *Deleted

## 2016-12-11 NOTE — Progress Notes (Signed)
Physical Therapy Session Note  Patient Details  Name: Jason Nolan MRN: [id number] Date of Birth: 1945-06-12  Today's Date: 12/11/2016 PT Individual Time: 0800-0900 PT Individual Time Calculation (min): 60 min   Short Term Goals: Week 2:  PT Short Term Goal 1 (Week 2): Pt will ambulate 188f with LRAD with min A in controlled environment  PT Short Term Goal 2 (Week 2): Pt will ascend/descend 4 steps with min A  PT Short Term Goal 3 (Week 2): Pt will initiate gait training in the community PT Short Term Goal 4 (Week 2): Pt will be min A with w/c mobility in community environment  Skilled Therapeutic Interventions/Progress Updates: Pt presented sitting at [name] completing breakfast and agreeable to therapy. PTA donned pants and shoes for time management.  Performed sit to stand with min guard while pt maintained fair standing balance to pull up pants with RUE. Transported pt to rehab gym for energy conservation. Performed gait training 1031fwith minA cues for increased L foot clearance and heel strike. Mod verbal cues during turns for LE placement due to poor placement of LLE causing scissoring gait. Pt able to correct when cues for perform slow deliberate movement. Standing balance activities with pt standing on 4in step with RLE for increased wt shift on LLE. Pt tapping beach ball to RT reaching outside BOS and demonstrating no LOB with moderate challenges. Pt also performed balance with RLE on airdisk with min challenges. Pt requiring intermittent breaks due to fatigue. Pt ambulated back to room with RW and requiring mod cues for increased dorsiflexion but demonstrating improved foot placement with turns. Pt remained in w/c once in room and left with call bell within reach and needs met.      Therapy Documentation Precautions:  Precautions Precautions: Fall Restrictions Weight Bearing Restrictions: No General:   Vital Signs:   Pain:   Mobility:   Locomotion :    Trunk/Postural  Assessment :    Balance:   Exercises:   Other Treatments:     See Function Navigator for Current Functional Status.   Therapy/Group: Individual Therapy  Ebbie Sorenson 12/11/2016, 12:20 PM

## 2016-12-11 NOTE — Progress Notes (Signed)
Speech Language Pathology Daily Session Note  Patient Details  Name: Terrilee CroakJames Hippert MRN: [id number] Date of Birth: 05-30-1945  Today's Date: 12/11/2016 SLP Concurrent Time: [id number] SLP Concurrent Time Calculation (min): 45 min   Short Term Goals: Week 2: SLP Short Term Goal 1 (Week 2): Pt will utilize overarticulation to achieve intelligibility at the phrase level with mod assist multimodal cues.   SLP Short Term Goal 2 (Week 2): Pt will communicate his immediate needs and wants via multimodal means with supervision verbal cues.   SLP Short Term Goal 3 (Week 2): Pt will recognize and correct verbal errors at the phrase level with mod assist verbal cues.    Skilled Therapeutic Interventions: Skilled treatment session focused on speech intelligibility goals. SLP facilitated session by providing Min A to achieve ~ 50 to 75% intelligibility at the phrase level to unfamiliar listener. Pt able to independently self-monitor and attempt to correct with speech intelligibility strategies (specifically overt articulation) to increase unintelligible speech. Pt returned to room, left upright in wheelchair with all needs within reach. Continue per current plan of care.      Function:    Cognition Comprehension Comprehension assist level: Follows basic conversation/direction with no assist  Expression   Expression assist level: Expresses basic 75 - 89% of the time/requires cueing 10 - 24% of the time. Needs helper to occlude trach/needs to repeat words.;Expresses basic 50 - 74% of the time/requires cueing 25 - 49% of the time. Needs to repeat parts of sentences.  Social Interaction Social Interaction assist level: Interacts appropriately with others - No medications needed.  Problem Solving Problem solving assist level: Solves complex problems: With extra time  Memory Memory assist level: Recognizes or recalls 90% of the time/requires cueing < 10% of the time;More than reasonable amount of time     Pain    Therapy/Group: Individual Therapy  Forney Kleinpeter 12/11/2016, 12:00 PM

## 2016-12-11 NOTE — Progress Notes (Signed)
Physical Therapy Session Note  Patient Details  Name: Terrilee CroakJames Hedberg MRN: [id number] Date of Birth: 21-Nov-1945  Today's Date: 12/11/2016 PT Individual Time: 1545-1615 PT Individual Time Calculation (min): 30 min   Short Term Goals: Week 2:  PT Short Term Goal 1 (Week 2): Pt will ambulate 12750ft with LRAD with min A in controlled environment  PT Short Term Goal 2 (Week 2): Pt will ascend/descend 4 steps with min A  PT Short Term Goal 3 (Week 2): Pt will initiate gait training in the community PT Short Term Goal 4 (Week 2): Pt will be min A with w/c mobility in community environment  Skilled Therapeutic Interventions/Progress Updates:    Pt seated in w/c upon PT arrival, agreeable to therapy tx and denies pain. Pt propelled w/c to the gym using B LEs with supervision. Session focused on gait training with both RW and quad cane. Min assist for ambulation, x 1 trial using RW x 80 ft and x 2 trials using quad cane x 80 ft, During ambulation pt requiring minimal cues for L DF and heel strike as he is more aware and able to self correct on his own. Using the quad cane pt becomes more fatigued and decreased toe clearance is noted at times compared to when using RW. Seated, pt performed 2 x 10 LAQ with focus on hold at end range and eccentric control with lowering, and 2 x 10 hamstring curls with scooter board verbal cues for control. Pt left seated in w/c in room with needs in reach and family present.     Therapy Documentation Precautions:  Precautions Precautions: Fall Restrictions Weight Bearing Restrictions: No       See Function Navigator for Current Functional Status.   Therapy/Group: Individual Therapy  Cresenciano GenreEmily van Schagen, PT, DPT 12/11/2016, 6:05 PM

## 2016-12-11 NOTE — Progress Notes (Signed)
[place] PHYSICAL MEDICINE & REHABILITATION     PROGRESS NOTE  Subjective/Complaints:   No issues overnight, discussed team conference with the patient.  ROS: Denies CP, SOB, N/V/D.  Objective: Vital Signs: Blood pressure 126/75, pulse 74, temperature 98.3 F (36.8 C), temperature source Oral, resp. rate 16, height 5' 10"  (1.778 m), weight 86 kg (189 lb 9.1 oz), SpO2 100 %. No results found. No results for input(s): WBC, HGB, HCT, PLT in the last 72 hours. No results for input(s): NA, K, CL, GLUCOSE, BUN, CREATININE, CALCIUM in the last 72 hours.  Invalid input(s): CO CBG (last 3)  No results for input(s): GLUCAP in the last 72 hours.  Wt Readings from Last 3 Encounters:  12/11/16 86 kg (189 lb 9.1 oz)  11/25/16 96.9 kg (213 lb 10 oz)  11/25/16 90.7 kg (200 lb)    Physical Exam:  BP 126/75 (BP Location: Right Arm)   Pulse 74   Temp 98.3 F (36.8 C) (Oral)   Resp 16   Ht 5' 10"  (1.778 m)   Wt 86 kg (189 lb 9.1 oz)   SpO2 100%   BMI 27.20 kg/m  Constitutional: He appears well-developedand well-nourished. No distress.  HENT: Normocephalicand atraumatic.  Eyes: EOMI. No discharge. Cardiovascular: RRR. No murmurheard. Respiratory: Effort normal breath sounds normal. GI: Soft. Bowel sounds are normal.  Musculoskeletal: He exhibits edema(LUE), no tenderness.  Neurological: He is alertand oriented Dysarthria.  Fair insight and awareness.  Left facial weakness  LUE: 2-/5 biceps and finger flexors, 0/5 L finger ext RUE/RLE: 4/5 proximal to distal LLE: 2/5 HF, KE, ADF/PF Skin: Skin is warmand dry. He is not diaphoretic.  Psychiatric:   Assessment/Plan: 1. Functional deficits secondary to embolic right MCA infarct which require 3+ hours per day of interdisciplinary therapy in a comprehensive inpatient rehab setting. Physiatrist is providing close team supervision and 24 hour management of active medical problems listed below. Physiatrist and rehab team continue  to assess barriers to discharge/monitor patient progress toward functional and medical goals.  Function:  Bathing Bathing position   Position: Shower  Bathing parts Body parts bathed by patient: Right arm, Left arm, Chest, Abdomen, Front perineal area, Buttocks, Right upper leg, Left upper leg, Right lower leg, Left lower leg Body parts bathed by helper: Back  Bathing assist Assist Level: Touching or steadying assistance(Pt > 75%)      Upper Body Dressing/Undressing Upper body dressing   What is the patient wearing?: Pull over shirt/dress     Pull over shirt/dress - Perfomed by patient: Thread/unthread left sleeve, Thread/unthread right sleeve, Put head through opening, Pull shirt over trunk Pull over shirt/dress - Perfomed by helper: Pull shirt over trunk        Upper body assist Assist Level: Supervision or verbal cues      Lower Body Dressing/Undressing Lower body dressing   What is the patient wearing?: Underwear, Pants, Socks, Shoes Underwear - Performed by patient: Thread/unthread right underwear leg, Thread/unthread left underwear leg, Pull underwear up/down Underwear - Performed by helper: Pull underwear up/down Pants- Performed by patient: Thread/unthread right pants leg, Thread/unthread left pants leg, Pull pants up/down Pants- Performed by helper: Pull pants up/down Non-skid slipper socks- Performed by patient: Don/doff right sock, Don/doff left sock Non-skid slipper socks- Performed by helper: Don/doff left sock, Don/doff right sock Socks - Performed by patient: Don/doff left sock, Don/doff right sock   Shoes - Performed by patient: Don/doff right shoe, Don/doff left shoe  Lower body assist Assist for lower body dressing: Touching or steadying assistance (Pt > 75%)      Toileting Toileting   Toileting steps completed by patient: Performs perineal hygiene Toileting steps completed by helper: Adjust clothing prior to toileting, Adjust clothing after  toileting Toileting Assistive Devices: Grab bar or rail  Toileting assist Assist level: Touching or steadying assistance (Pt.75%)   Transfers Chair/bed transfer   Chair/bed transfer method: Stand pivot Chair/bed transfer assist level: Touching or steadying assistance (Pt > 75%) Chair/bed transfer assistive device: Armrests     Locomotion Ambulation     Max distance: 134f Assist level: Touching or steadying assistance (Pt > 75%)   Wheelchair   Type: Manual Max wheelchair distance: 1758fAssist Level: Supervision or verbal cues  Cognition Comprehension Comprehension assist level: Follows basic conversation/direction with no assist  Expression Expression assist level: Expresses basic 75 - 89% of the time/requires cueing 10 - 24% of the time. Needs helper to occlude trach/needs to repeat words., Expresses basic 50 - 74% of the time/requires cueing 25 - 49% of the time. Needs to repeat parts of sentences.  Social Interaction Social Interaction assist level: Interacts appropriately with others - No medications needed.  Problem Solving Problem solving assist level: Solves complex problems: With extra time  Memory Memory assist level: Recognizes or recalls 90% of the time/requires cueing < 10% of the time, More than reasonable amount of time    Medical Problem List and Plan: 1. Left hemiparesis and speech/swallowing difficultiessecondary to embolic (?artery to artery)  right MCA infarct  Cont CIR PT, OT, SLP- Increasing speech intelligibility Team conference today please see physician documentation under team conference tab, met with team face-to-face to discuss problems,progress, and goals. Formulized individual treatment plan based on medical history, underlying problem and comorbidities.       2. DVT Prophylaxis/Anticoagulation: Pharmaceutical: Lovenox,Brillinta 3. Pain Management: tylenol prn  4. Mood: LCSW to follow for evaluation and support. Team to provide ego support--showing  signs of adjustment reaction. 5. Neuropsych: This patient appears to be capable of making decisions on hisown behalf. 6. Skin/Wound Care: routine pressure relief measures.  7. Fluids/Electrolytes/Nutrition: Monitor I/Os  BMP within acceptable range on 7/28 8. Glaucoma: Continue Xalatan as at home. 9. ABLA:   Hb 12.5 on 8/2  Cont to monitor 10. Elevated triglycerides: Now on Lipitor.  11. HTN: Controlled 8/8   Vitals:   12/10/16 1444 12/11/16 0534  BP: 116/64 126/75  Pulse: 75 74  Resp: 17 16  Temp: 97.6 F (36.4 C) 98.3 F (36.8 C)   12. Dysphagia: pt on D3 diet per acute SLP recs: pt very dysarthric with poor oro-motor control. No swallowing problems reported 13. Hypoalbuminemia  Supplement initiated 7/28  LOS (Days) 12 A FACE TO FACE EVALUATION WAS PERFORMED  KICharlett Blake/12/2016 1:54 PM

## 2016-12-11 NOTE — Progress Notes (Signed)
Occupational Therapy Session Note  Patient Details  Name: Jason Nolan MRN: [id number] Date of Birth: [birth year]  Today's Date: 12/11/2016 OT Individual Time: 1400-1500 OT Individual Time Calculation (min): 60 min   Short Term Goals: Week 2:  OT Short Term Goal 1 (Week 2): Pt will utilize L UE as a stabilizer with min questioning cues OT Short Term Goal 2 (Week 2): Pt will complete L UE self-ROM without assistance OT Short Term Goal 3 (Week 2): Pt will maintain standing balance with 50% supervision while standing to pull up pants.   Skilled Therapeutic Interventions/Progress Updates:    Pt greeted seated in wc and agreeable to OT. Pt ambulated wc to bathroom with min a and VC for RW placement and timinig when turning to sit onto tub bench. Pt able to doff clothing sit<>stand with close supervision and VC for safe technique to sit to remove pants off feet. Pt completed bathing with min A to don L wash mit, then able to functionally use L UE for bathing. Pt with 1 near posterior LOB when standing to wash buttocks requiring min A to correct. Pt ambulated out of shower with close supervision and intermittent min guard A for turning to sit onto wc for dressing. Min VC to integrate L UE with dressing tasks. L NMR focused on normal movement patterns of shoulder/elbow/wrist/hand. Pt left seated in wc at end of session with needs met.   Therapy Documentation Precautions:  Precautions Precautions: Fall Restrictions Weight Bearing Restrictions: No Pain:  none/denies pain  See Function Navigator for Current Functional Status.  Therapy/Group: Individual Therapy  Valma Cava 12/11/2016, 2:24 PM

## 2016-12-12 DIAGNOSIS — I639 Cerebral infarction, unspecified: Secondary | ICD-10-CM

## 2016-12-12 DIAGNOSIS — I69398 Other sequelae of cerebral infarction: Secondary | ICD-10-CM

## 2016-12-12 DIAGNOSIS — R269 Unspecified abnormalities of gait and mobility: Secondary | ICD-10-CM

## 2016-12-12 LAB — CBC
HCT: 38.8 % — ABNORMAL LOW (ref 39.0–52.0)
Hemoglobin: 12.5 g/dL — ABNORMAL LOW (ref 13.0–17.0)
MCH: 30.5 pg (ref 26.0–34.0)
MCHC: 32.2 g/dL (ref 30.0–36.0)
MCV: 94.6 fL (ref 78.0–100.0)
PLATELETS: 318 10*3/uL (ref 150–400)
RBC: 4.1 MIL/uL — ABNORMAL LOW (ref 4.22–5.81)
RDW: 13.7 % (ref 11.5–15.5)
WBC: 3.3 10*3/uL — AB (ref 4.0–10.5)

## 2016-12-12 LAB — BASIC METABOLIC PANEL
Anion gap: 10 (ref 5–15)
BUN: 15 mg/dL (ref 6–20)
CALCIUM: 8.8 mg/dL — AB (ref 8.9–10.3)
CO2: 24 mmol/L (ref 22–32)
CREATININE: 0.99 mg/dL (ref 0.61–1.24)
Chloride: 107 mmol/L (ref 101–111)
GFR calc Af Amer: >60 mL/min (ref >60)
Glucose, Bld: 98 mg/dL (ref 65–99)
POTASSIUM: 3.8 mmol/L (ref 3.5–5.1)
SODIUM: 141 mmol/L (ref 135–145)

## 2016-12-12 NOTE — Progress Notes (Signed)
Recreational Therapy Session Note  Patient Details  Name: Jason CroakJames Nolan MRN: [id number] Date of Birth: Sep 20, 1945 Today's Date: 12/12/2016  Pain: no c/o Skilled Therapeutic Interventions/Progress Updates: Goal:  Pt will maintain dynamic standing balance with min assist.  Pt will use LUE as gross assist with min assist.  Session focused on dynamic standing balance, LUE use and overall activity tolerance.  Pt stood to putt golf balls using BUE's with Min assist, min cues for balance.  Pt ambulated short distances without RW to retrieve golf balls from the floor using RUE with min assist.  Pt able to carry golf ball in LUE given verbal cues/reminders to "squeeze" while ambulating back to w/c. Rest breaks in between putting rounds of 4 balls each.   Jason Nolan 12/12/2016, 10:41 AM

## 2016-12-12 NOTE — Progress Notes (Signed)
Speech Language Pathology Daily Session Note  Patient Details  Name: Jason Nolan MRN: [id number] Date of Birth: 09-27-1945  Today's Date: 12/12/2016 SLP Individual Time: 1350-1433 SLP Individual Time Calculation (min): 43 min  Short Term Goals: Week 2: SLP Short Term Goal 1 (Week 2): Pt will utilize overarticulation to achieve intelligibility at the phrase level with mod assist multimodal cues.   SLP Short Term Goal 2 (Week 2): Pt will communicate his immediate needs and wants via multimodal means with supervision verbal cues.   SLP Short Term Goal 3 (Week 2): Pt will recognize and correct verbal errors at the phrase level with mod assist verbal cues.    Skilled Therapeutic Interventions:  Pt was seen for skilled ST targeting communication goals.  Pt needed min assist verbal cues to slow rate and to repeat words/phrases to clarify in order to achieve intelligibility at the sentence level during both structured and unstructured speech tasks in a moderately distracting environment.  Pt was returned to room and left in wheelchair with call bell within reach.  Continue per current plan of care.       Function:  Eating Eating               Cognition Comprehension Comprehension assist level: Follows basic conversation/direction with no assist  Expression   Expression assist level: Expresses basic 75 - 89% of the time/requires cueing 10 - 24% of the time. Needs helper to occlude trach/needs to repeat words.  Social Interaction Social Interaction assist level: Interacts appropriately with others - No medications needed.  Problem Solving Problem solving assist level: Solves complex problems: With extra time  Memory Memory assist level: More than reasonable amount of time    Pain Pain Assessment Pain Assessment: No/denies pain  Therapy/Group: Individual Therapy  Keirstyn Aydt, Melanee SpryNicole L 12/12/2016, 4:34 PM

## 2016-12-12 NOTE — Progress Notes (Signed)
Social Work Patient ID: [name], male   DOB: December 08, 1945, 71 y.o.   MRN: [id number]   [name], Gerline Legacy, LCSW Social Worker Signed   Patient Care Conference Date of Service: 12/12/2016 12:18 PM      Hide copied text Hover for attribution information Inpatient RehabilitationTeam Conference and Plan of Care Update Date: 12/11/2016   Time: 11:20 AM      Patient Name: Jason Nolan      Medical Record Number: [id number]  Date of Birth: 20-Apr-1946 Sex: Male         Room/Bed: 4M09C/4M09C-01 Payor Info: Payor: MEDICARE / Plan: MEDICARE PART A AND B / Product Type: *No Product type* /     Admitting Diagnosis: R CVA  Admit Date/Time:  11/29/2016  4:16 PM Admission Comments: No comment available    Primary Diagnosis:  Gait disturbance, post-stroke Principal Problem: Gait disturbance, post-stroke       Patient Active Problem List    Diagnosis Date Noted  . Gait disturbance, post-stroke 12/12/2016  . 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 Hypoalbuminemia due to protein-calorie malnutrition ([place])    . Dysarthria, post-stroke    . Benign essential HTN    . Acute blood loss anemia    . Acute right MCA stroke ([place]) 11/29/2016  . Dysphagia, oropharyngeal 11/29/2016  . Essential hypertension 11/29/2016  . Cerebral embolism with cerebral infarction 11/25/2016  . 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 Leukopenia 03/31/2016  . Hepatitis C 07/22/2013      Expected Discharge Date: Expected Discharge Date: 12/21/16   Team Members Present: Physician leading conference: Dr. Alysia Penna Social Worker Present: Alfonse Alpers, LCSW Nurse Present: Isla Pence, RN PT Present: Leavy Cella, PT OT Present: Cherylynn Ridges, OT SLP Present: Stormy Fabian, SLP PPS Coordinator present : Daiva Nakayama, RN, CRRN       Current Status/Progress Goal Weekly Team Focus  Medical    increasing speech intelligibility, neurologic improvements with left upper extremity functioning  continued management. Blood pressure, maintain stability  decreased speech intelligibility   Bowel/Bladder     continent b/b; lbm 8/7  maintain continence during rehab stay  assess continence q shift and prn   Swallow/Nutrition/ Hydration     Regular with thin liquids  Mod I - goal met  goal met   ADL's     Min/supervision overall  Supervision overall  L NMR, modified bathing/dressing, dc planning, functiional use of L UE   Mobility     min A transfers, min A ambulation with RW, supervision w/c mobility  supervision bed mobility, transfers, and gait with LRAD; min A stairs; mod-I w/c  dynamic standing balance, stepping strategy, gait training, LE strengthening   Communication     Mod A at simple phrase  Min A  use of speech intelligibility strategies   Safety/Cognition/ Behavioral Observations             Pain     denies pain         Skin     CDI           Rehab Goals Patient on target to meet rehab goals: Yes Rehab Goals Revised: none *See Care Plan and progress notes for long and short-term goals.      Barriers to Discharge   Current Status/Progress Possible Resolutions Date Resolved   Physician  Medical stability;Home environment access/layout;Other (comments)  50% speech intelligibility  progressing towards goals  continue CIR, discharge planning      Nursing                 PT  Decreased caregiver support                 OT                 SLP            SW              Discharge Planning/Teaching Needs:  Pt plans to return to his home with his wife to provide supervision.  Wife will be offered family education closer to d/c.   Team Discussion:  MD and team stated that pt is participating well in therapy.  Pt is continent with no c/o pain, but c/o of speech.  Pt is close to supervision with standing balance in shower; not seeing much tone in pt's arm.  PT stated  pt has walked 45' with rolling walker and is min A.  Pt with toe clearance problems and PT is going to try AFO.  ST stated that pt is 50%-70% with speech intelligibility at phrase level, sometimes sentence.  Pt can monitor and self correct.  Enjoyed speech group and found it helpful.  Revisions to Treatment Plan:  none    Continued Need for Acute Rehabilitation Level of Care: The patient requires daily medical management by a physician with specialized training in physical medicine and rehabilitation for the following conditions: Daily direction of a multidisciplinary physical rehabilitation program to ensure safe treatment while eliciting the highest outcome that is of practical value to the patient.: Yes Daily medical management of patient stability for increased activity during participation in an intensive rehabilitation regime.: Yes Daily analysis of laboratory values and/or radiology reports with any subsequent need for medication adjustment of medical intervention for : Neurological problems   ,  Capps 12/12/2016, 12:18 PM              

## 2016-12-12 NOTE — Progress Notes (Signed)
Physical Therapy Session Note  Patient Details  Name: Jason Nolan MRN: [id number] Date of Birth: 02-03-1946  Today's Date: 12/12/2016 PT Individual Time: 0805-0900 PT Individual Time Calculation (min): 55 min    Skilled Therapeutic Interventions/Progress Updates:    Pt denies pain.  Pt initial vitals:  BP: 127/94; HR: 81 bpm.  Session today focused on improving gait kinematics.  Pt ambulated 50 ft x 2 with no assistive device with min assist and verbal cues to " kick out" with L LE.  Pt additionally performed sit to stand with and without side step to work on L LE strength.  Pt also played horseshoes in standing with emphasis on LE stance.  Much of session today worked on pt putting as pt enjoys golf.  This activity focused on improving standing balance, L sided weight left, and also use of B UE while holding golf club.  No complaints reported during session.  Following session, pt returned to room and left up in wheelchair with needs met.  Therapy Documentation Precautions:  Precautions Precautions: Fall Restrictions Weight Bearing Restrictions: No   See Function Navigator for Current Functional Status.   Therapy/Group: Individual Therapy  Deniz Eskridge Hilario Quarry 12/12/2016, 12:35 PM

## 2016-12-12 NOTE — Progress Notes (Signed)
Occupational Therapy Session Note  Patient Details  Name: Jason Nolan MRN: [id number] Date of Birth: 06-May-1946  Today's Date: 12/12/2016  Session 1  OT Individual Time: 1100-1157 OT Individual Time Calculation (min): 57 min   Session 2 OT Individual Time: 1300-1330 OT Individual Time Calculation (min): 30 min    Short Term Goals: Week 2:  OT Short Term Goal 1 (Week 2): Pt will utilize L UE as a stabilizer with min questioning cues OT Short Term Goal 2 (Week 2): Pt will complete L UE self-ROM without assistance OT Short Term Goal 3 (Week 2): Pt will maintain standing balance with 50% supervision while standing to pull up pants.   Skilled Therapeutic Interventions/Progress Updates:  Session 1   OT treatment session focused on L NMR, modified bathing/dressing, functional ambulation, and functional use of L UE.Marland Kitchen Pt ambulated to bathroom with close supervision and 1 near lateral LOB requiring mod A to correct. Pt doffed clothing with supervision, then completed bathing tasks with supervision and increased L UE AROM. Worked on functional grasp onto wash cloth, but pt unable to maintain within functional context requiring use of wash mit. Pt with more conscious use of L UE today. Pt able to maintain L hand position on grab bar to stabilize as pt stood to wash buttocks. Pt ambulated out of shower with RW and min guard A[employer] forced use of L UE into dressing tasks with pt almost able to grasp pants, but unable to maintain grasp while pulling up pants. Pt donned socks and shoes with set-up A, and was left seated in wc at end of session awaiting lunch.   Session 2 OT treatment session focused on L NMR and NMES. 1:1 NMES applied to wrist extensors.  Ratio 1:1 Rate 35 pps Waveform- Asymmetric Ramp 1.0 Pulse 300 Intensity- 26 Duration -   15 No adverse reactions after treatment and is skin intact.   Pt stood at raised desk and completed towel pushes forward and back, clockwise, and  counter clockwise 15 each x 3 sets. Pt returned to room at end of session and left with needs met.   Therapy Documentation Precautions:  Precautions Precautions: Fall Restrictions Weight Bearing Restrictions: No Pain:  none/denies pain  See Function Navigator for Current Functional Status.   Therapy/Group: Individual Therapy  Valma Cava 12/12/2016, 2:20 PM

## 2016-12-12 NOTE — Patient Care Conference (Signed)
Inpatient RehabilitationTeam Conference and Plan of Care Update Date: 12/11/2016   Time: 11:20 AM    Patient Name: Jason Nolan      Medical Record Number: [id number]  Date of Birth: [birth year] Sex: Male         Room/Bed: 4M09C/4M09C-01 Payor Info: Payor: MEDICARE / Plan: MEDICARE PART A AND B / Product Type: *No Product type* /    Admitting Diagnosis: R CVA  Admit Date/Time:  11/29/2016  4:16 PM Admission Comments: No comment available   Primary Diagnosis:  Gait disturbance, post-stroke Principal Problem: Gait disturbance, post-stroke  Patient Active Problem List   Diagnosis Date Noted  . Gait disturbance, post-stroke 12/12/2016  . 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 Hypoalbuminemia due to protein-calorie malnutrition ([place])   . Dysarthria, post-stroke   . Benign essential HTN   . Acute blood loss anemia   . Acute right MCA stroke ([place]) 11/29/2016  . Dysphagia, oropharyngeal 11/29/2016  . Essential hypertension 11/29/2016  . Cerebral embolism with cerebral infarction 11/25/2016  . 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 Leukopenia 03/31/2016  . Hepatitis C 07/22/2013    Expected Discharge Date: Expected Discharge Date: 12/21/16  Team Members Present: Physician leading conference: Dr. Alysia Penna Social Worker Present: Alfonse Alpers, LCSW Nurse Present: Isla Pence, RN PT Present: Leavy Cella, PT OT Present: Cherylynn Ridges, OT SLP Present: Stormy Fabian, SLP PPS Coordinator present : Daiva Nakayama, RN, CRRN     Current Status/Progress Goal Weekly Team Focus  Medical   increasing speech intelligibility, neurologic improvements with left upper extremity functioning  continued management. Blood pressure, maintain stability  decreased speech intelligibility   Bowel/Bladder   continent b/b; lbm 8/7  maintain continence during rehab stay  assess continence  q shift and prn   Swallow/Nutrition/ Hydration   Regular with thin liquids  Mod I - goal met  goal met   ADL's   Min/supervision overall  Supervision overall  L NMR, modified bathing/dressing, dc planning, functiional use of L UE   Mobility   min A transfers, min A ambulation with RW, supervision w/c mobility  supervision bed mobility, transfers, and gait with LRAD; min A stairs; mod-I w/c  dynamic standing balance, stepping strategy, gait training, LE strengthening   Communication   Mod A at simple phrase  Min A  use of speech intelligibility strategies   Safety/Cognition/ Behavioral Observations            Pain   denies pain         Skin   CDI          Rehab Goals Patient on target to meet rehab goals: Yes Rehab Goals Revised: none *See Care Plan and progress notes for long and short-term goals.     Barriers to Discharge  Current Status/Progress Possible Resolutions Date Resolved   Physician    Medical stability;Home environment access/layout;Other (comments)  50% speech intelligibility  progressing towards goals  continue CIR, discharge planning      Nursing                  PT  Decreased caregiver support                 OT                  SLP  SW                Discharge Planning/Teaching Needs:  Pt plans to return to his home with his wife to provide supervision.  Wife will be offered family education closer to d/c.   Team Discussion:  MD and team stated that pt is participating well in therapy.  Pt is continent with no c/o pain, but c/o of speech.  Pt is close to supervision with standing balance in shower; not seeing much tone in pt's arm.  PT stated pt has walked 60' with rolling walker and is min A.  Pt with toe clearance problems and PT is going to try AFO.  ST stated that pt is 50%-70% with speech intelligibility at phrase level, sometimes sentence.  Pt can monitor and self correct.  Enjoyed speech group and found it helpful.  Revisions to  Treatment Plan:  none    Continued Need for Acute Rehabilitation Level of Care: The patient requires daily medical management by a physician with specialized training in physical medicine and rehabilitation for the following conditions: Daily direction of a multidisciplinary physical rehabilitation program to ensure safe treatment while eliciting the highest outcome that is of practical value to the patient.: Yes Daily medical management of patient stability for increased activity during participation in an intensive rehabilitation regime.: Yes Daily analysis of laboratory values and/or radiology reports with any subsequent need for medication adjustment of medical intervention for : Neurological problems  Arisha Gervais, Silvestre Mesi 12/12/2016, 12:18 PM

## 2016-12-12 NOTE — Progress Notes (Signed)
[facility] PHYSICAL MEDICINE & REHABILITATION     PROGRESS NOTE  Subjective/Complaints:   No issues overnite, no left shoulder pain  ROS: Denies CP, SOB, N/V/D.  Objective: Vital Signs: Blood pressure 129/73, pulse 67, temperature 98.2 F (36.8 C), temperature source Oral, resp. rate 17, height 5\' 10"  (1.778 m), weight 86 kg (189 lb 9.1 oz), SpO2 99 %. No results found.  Recent Labs  12/12/16 0412  WBC 3.3*  HGB 12.5*  HCT 38.8*  PLT 318    Recent Labs  12/12/16 0412  NA 141  K 3.8  CL 107  GLUCOSE 98  BUN 15  CREATININE 0.99  CALCIUM 8.8*   CBG (last 3)  No results for input(s): GLUCAP in the last 72 hours.  Wt Readings from Last 3 Encounters:  12/11/16 86 kg (189 lb 9.1 oz)  11/25/16 96.9 kg (213 lb 10 oz)  11/25/16 90.7 kg (200 lb)    Physical Exam:  BP 129/73 (BP Location: Right Arm)   Pulse 67   Temp 98.2 F (36.8 C) (Oral)   Resp 17   Ht 5\' 10"  (1.778 m)   Wt 86 kg (189 lb 9.1 oz)   SpO2 99%   BMI 27.20 kg/m  Constitutional: He appears well-developedand well-nourished. No distress.  HENT: Normocephalicand atraumatic.  Eyes: EOMI. No discharge. Cardiovascular: RRR. No murmurheard. Respiratory: Effort normal breath sounds normal. GI: Soft. Bowel sounds are normal.  Musculoskeletal: He exhibits edema(LUE), no tenderness.  Neurological: He is alertand oriented Dysarthria.  Fair insight and awareness.  Left facial weakness  LUE: 2-/5 biceps and finger flexors, 0/5 L finger ext RUE/RLE: 4/5 proximal to distal LLE: 2/5 HF, KE, ADF/PF Skin: Skin is warmand dry. He is not diaphoretic.  Psychiatric:   Assessment/Plan: 1. Functional deficits secondary to embolic right MCA infarct which require 3+ hours per day of interdisciplinary therapy in a comprehensive inpatient rehab setting. Physiatrist is providing close team supervision and 24 hour management of active medical problems listed below. Physiatrist and rehab team continue to assess  barriers to discharge/monitor patient progress toward functional and medical goals.  Function:  Bathing Bathing position   Position: Shower  Bathing parts Body parts bathed by patient: Right arm, Left arm, Chest, Abdomen, Front perineal area, Buttocks, Right upper leg, Left upper leg, Right lower leg, Left lower leg Body parts bathed by helper: Back  Bathing assist Assist Level: Touching or steadying assistance(Pt > 75%)      Upper Body Dressing/Undressing Upper body dressing   What is the patient wearing?: Pull over shirt/dress     Pull over shirt/dress - Perfomed by patient: Thread/unthread left sleeve, Thread/unthread right sleeve, Put head through opening, Pull shirt over trunk Pull over shirt/dress - Perfomed by helper: Pull shirt over trunk        Upper body assist Assist Level: Supervision or verbal cues      Lower Body Dressing/Undressing Lower body dressing   What is the patient wearing?: Underwear, Pants, Socks, Shoes Underwear - Performed by patient: Thread/unthread right underwear leg, Thread/unthread left underwear leg, Pull underwear up/down Underwear - Performed by helper: Pull underwear up/down Pants- Performed by patient: Thread/unthread right pants leg, Thread/unthread left pants leg, Pull pants up/down Pants- Performed by helper: Pull pants up/down Non-skid slipper socks- Performed by patient: Don/doff right sock, Don/doff left sock Non-skid slipper socks- Performed by helper: Don/doff left sock, Don/doff right sock Socks - Performed by patient: Don/doff left sock, Don/doff right sock   Shoes - Performed by  patient: Don/doff right shoe, Don/doff left shoe            Lower body assist Assist for lower body dressing: Touching or steadying assistance (Pt > 75%)      Toileting Toileting   Toileting steps completed by patient: Performs perineal hygiene Toileting steps completed by helper: Adjust clothing prior to toileting, Adjust clothing after  toileting Toileting Assistive Devices: Grab bar or rail  Toileting assist Assist level: Touching or steadying assistance (Pt.75%)   Transfers Chair/bed transfer   Chair/bed transfer method: Stand pivot Chair/bed transfer assist level: Touching or steadying assistance (Pt > 75%) Chair/bed transfer assistive device: Armrests, Bedrails     Locomotion Ambulation     Max distance: 80 ft Assist level: Touching or steadying assistance (Pt > 75%)   Wheelchair   Type: Manual Max wheelchair distance: 11075ft Assist Level: Supervision or verbal cues  Cognition Comprehension Comprehension assist level: Follows basic conversation/direction with no assist  Expression Expression assist level: Expresses basic 75 - 89% of the time/requires cueing 10 - 24% of the time. Needs helper to occlude trach/needs to repeat words., Expresses basic 50 - 74% of the time/requires cueing 25 - 49% of the time. Needs to repeat parts of sentences.  Social Interaction Social Interaction assist level: Interacts appropriately with others - No medications needed.  Problem Solving Problem solving assist level: Solves complex problems: With extra time  Memory Memory assist level: Recognizes or recalls 90% of the time/requires cueing < 10% of the time, More than reasonable amount of time    Medical Problem List and Plan: 1. Left hemiparesis and speech/swallowing difficultiessecondary to embolic (?artery to artery)  right MCA infarct  Cont CIR PT, OT, SLP- Progressing with therapy     2. DVT Prophylaxis/Anticoagulation: Pharmaceutical: Lovenox,Brillinta 3. Pain Management: tylenol prn  4. Mood: LCSW to follow for evaluation and support. Team to provide ego support--showing signs of adjustment reaction. 5. Neuropsych: This patient appears to be capable of making decisions on hisown behalf. 6. Skin/Wound Care: routine pressure relief measures.  7. Fluids/Electrolytes/Nutrition: Monitor I/Os  BMP within acceptable range  on 7/28 8. Glaucoma: Continue Xalatan as at home. 9. ABLA:   Hb 12.5 on 8/9  Cont to monitor 10. Elevated triglycerides: Now on Lipitor.  11. HTN: Controlled 8/9   Vitals:   12/11/16 1400 12/12/16 0450  BP: 102/84 129/73  Pulse: 76 67  Resp: 17 17  Temp: 98 F (36.7 C) 98.2 F (36.8 C)   12. Dysphagia: pt on D3 diet per acute SLP recs: pt very dysarthric with poor oro-motor control. No swallowing problems reported 13. Hypoalbuminemia  Supplement initiated 7/28 14.  Mild neutropenia, afebrile , monitor LOS (Days) 13 A FACE TO FACE EVALUATION WAS PERFORMED  Hazelle Woollard E 12/12/2016 7:42 AM

## 2016-12-13 ENCOUNTER — Inpatient Hospital Stay (HOSPITAL_COMMUNITY): Payer: Medicare Other | Admitting: Speech Pathology

## 2016-12-13 ENCOUNTER — Inpatient Hospital Stay (HOSPITAL_COMMUNITY): Payer: Medicare Other | Admitting: Occupational Therapy

## 2016-12-13 ENCOUNTER — Inpatient Hospital Stay (HOSPITAL_COMMUNITY): Payer: Medicare Other | Admitting: Physical Therapy

## 2016-12-13 DIAGNOSIS — G8194 Hemiplegia, unspecified affecting left nondominant side: Secondary | ICD-10-CM

## 2016-12-13 NOTE — Progress Notes (Signed)
Occupational Therapy Session Note  Patient Details  Name: Jason Nolan MRN: [id number] Date of Birth: [birth year]  Today's Date: 12/13/2016 OT Individual Time: 1235-1350 OT Individual Time Calculation (min): 75 min   Short Term Goals: Week 2:  OT Short Term Goal 1 (Week 2): Pt will utilize L UE as a stabilizer with min questioning cues OT Short Term Goal 2 (Week 2): Pt will complete L UE self-ROM without assistance OT Short Term Goal 3 (Week 2): Pt will maintain standing balance with 50% supervision while standing to pull up pants.   Skilled Therapeutic Interventions/Progress Updates:    Pt completed self-feeding seated in wc with OT education on using L UE as a stabilizer. Pt able to actively place L UE onto tray with guided assist to facilitate normal movement patterns. Worked on finger flexion to grasp cup; pt able to grasp cup, but unable to release without assistance. Pt ambulated to shower with RW and supervision. Worked on grasping clothing to hand to therapist using L UE. Pt completed bathing tasks with supervision/set-up A. Able to maintain L UE position on grab bar while standing to wash buttocks in shower. Dressing completed with overall set-up/supervision A with increased time and verbal cues for use of L UE. L NMR seated in wc focused on shoulder flex/ext/abd/add, elbow flex/ext, forearm pronation/supination, wrist flex/ext, and finger flex/ext. Pt then ambulated 50 ft in hallway with supervision/min guard A for balance when turning RW. Pt left seated in wc with family present and needs met.   Therapy Documentation Precautions:  Precautions Precautions: Fall Restrictions Weight Bearing Restrictions: No Pain: Pain Assessment Pain Assessment: No/denies pain  See Function Navigator for Current Functional Status.   Therapy/Group: Individual Therapy  Valma Cava 12/13/2016, 1:07 PM

## 2016-12-13 NOTE — Progress Notes (Signed)
Physical Therapy Weekly Progress Note  Patient Details  Name: Jason Nolan MRN: [id number] Date of Birth: 19-Apr-1946  Beginning of progress report period: December 06, 2016 End of progress report period: December 13, 2016  Today's Date: 12/13/2016 PT Individual Time: 0900-1000 PT Individual Time Calculation (min): 60 min   Patient has met 3 of 4 short term goals.  Pt is making great progress towards LTGs.  Patient continues to demonstrate the following deficits muscle weakness, decreased cardiorespiratoy endurance, impaired timing and sequencing, abnormal tone, unbalanced muscle activation and decreased coordination, decreased safety awareness and decreased sitting balance, decreased standing balance, decreased postural control, hemiplegia and decreased balance strategies and therefore will continue to benefit from skilled PT intervention to increase functional independence with mobility.  Patient progressing toward long term goals..  Continue plan of care.  PT Short Term Goals Week 2:  PT Short Term Goal 1 (Week 2): Pt will ambulate 147f with LRAD with min A in controlled environment  PT Short Term Goal 1 - Progress (Week 2): Met PT Short Term Goal 2 (Week 2): Pt will ascend/descend 4 steps with min A  PT Short Term Goal 2 - Progress (Week 2): Met PT Short Term Goal 3 (Week 2): Pt will initiate gait training in the community PT Short Term Goal 3 - Progress (Week 2): Met PT Short Term Goal 4 (Week 2): Pt will be min A with w/c mobility in community environment PT Short Term Goal 4 - Progress (Week 2): Partly met Week 3:  PT Short Term Goal 1 (Week 3): Equal LTGs  Skilled Therapeutic Interventions/Progress Updates:    pt c/o "a little" L knee pain throughout session. PT treatment session focused on ambulation with LRAD, stair negotiation, L hip abductor strengthening, and dynamic standing balance on compliant surface.  Pt sitting in w/c upon arrival, agreeable to PT session. W/c mobility  using B LEs 1564fto therapy gym with supervision. Vitals assessed, written below. Ambulated 1502fith SPC with min assist for balance and verbal cues to "kick out" L LE during swing phase for improved foot clearance and greater step length. Standing on airex playing horseshoes 3 times, sitting break between, with min guard assist for steadying. Pt walked to/from horseshoe pile without AD and picked them up with min assist for balance. Performed lateral L hip hikes standing on step in parallel bars to fatigue with R UE finger touch for balance and mirror feedback for upright posture. PT provided verbal and tactile facilitation for hip movement focused on strengthening L hip abductor muscle group. Standing R LE hip abduction to fatigue focused on strengthening L LE hip muscles with close supervision and R UE support on parallel bar. Lateral side stepping in parallel bars down/back x 2 with min guard for balance and no UE support. PT rolled pt to room in w/c for time. Pt left sitting in w/c with call bell in reach.   Therapy Documentation Precautions:  Precautions Precautions: Fall Restrictions Weight Bearing Restrictions: No Vital Signs: Therapy Vitals Pulse Rate: 73 BP: 135/66 Patient Position (if appropriate): Sitting Oxygen Therapy SpO2: 99 % O2 Device: Not Delivered Pulse Oximetry Type: Intermittent Pain: Pain Assessment Pain Assessment: No/denies pain  See Function Navigator for Current Functional Status.  Therapy/Group: Individual Therapy  Marly Schuld 12/13/2016, 12:35 PM

## 2016-12-13 NOTE — Progress Notes (Signed)
[facility] PHYSICAL MEDICINE & REHABILITATION     PROGRESS NOTE  Subjective/Complaints:   No shoulder pain, no other c/os  ROS: Denies CP, SOB, N/V/D.  Objective: Vital Signs: Blood pressure 126/62, pulse 65, temperature 98.1 F (36.7 C), temperature source Oral, resp. rate 17, height 5\' 10"  (1.778 m), weight 86 kg (189 lb 9.1 oz), SpO2 100 %. No results found.  Recent Labs  12/12/16 0412  WBC 3.3*  HGB 12.5*  HCT 38.8*  PLT 318    Recent Labs  12/12/16 0412  NA 141  K 3.8  CL 107  GLUCOSE 98  BUN 15  CREATININE 0.99  CALCIUM 8.8*   CBG (last 3)  No results for input(s): GLUCAP in the last 72 hours.  Wt Readings from Last 3 Encounters:  12/11/16 86 kg (189 lb 9.1 oz)  11/25/16 96.9 kg (213 lb 10 oz)  11/25/16 90.7 kg (200 lb)    Physical Exam:  BP 126/62 (BP Location: Right Arm)   Pulse 65   Temp 98.1 F (36.7 C) (Oral)   Resp 17   Ht 5\' 10"  (1.778 m)   Wt 86 kg (189 lb 9.1 oz)   SpO2 100%   BMI 27.20 kg/m  Constitutional: He appears well-developedand well-nourished. No distress.  HENT: Normocephalicand atraumatic.  Eyes: EOMI. No discharge. Cardiovascular: RRR. No murmurheard. Respiratory: Effort normal breath sounds normal. GI: Soft. Bowel sounds are normal.  Musculoskeletal: He exhibits edema(LUE), no tenderness.  Neurological: He is alertand oriented Dysarthria.  Fair insight and awareness.  Left facial weakness  LUE: 3-/5 biceps and finger flexors, 2- wrist ext , 0/5 L finger ext RUE/RLE: 4/5 proximal to distal LLE: 2/5 HF, KE, ADF/PF Skin: Skin is warmand dry. He is not diaphoretic.  Psychiatric:   Assessment/Plan: 1. Functional deficits secondary to embolic right MCA infarct which require 3+ hours per day of interdisciplinary therapy in a comprehensive inpatient rehab setting. Physiatrist is providing close team supervision and 24 hour management of active medical problems listed below. Physiatrist and rehab team continue to  assess barriers to discharge/monitor patient progress toward functional and medical goals.  Function:  Bathing Bathing position   Position: Shower  Bathing parts Body parts bathed by patient: Left arm, Right arm, Chest, Abdomen, Front perineal area, Buttocks, Left upper leg, Right lower leg, Left lower leg, Right upper leg Body parts bathed by helper: Back  Bathing assist Assist Level: Supervision or verbal cues      Upper Body Dressing/Undressing Upper body dressing   What is the patient wearing?: Pull over shirt/dress     Pull over shirt/dress - Perfomed by patient: Thread/unthread left sleeve, Thread/unthread right sleeve, Put head through opening, Pull shirt over trunk Pull over shirt/dress - Perfomed by helper: Pull shirt over trunk        Upper body assist Assist Level: Supervision or verbal cues      Lower Body Dressing/Undressing Lower body dressing   What is the patient wearing?: Underwear, Pants, Socks, Shoes Underwear - Performed by patient: Pull underwear up/down, Thread/unthread left underwear leg, Thread/unthread right underwear leg Underwear - Performed by helper: Pull underwear up/down Pants- Performed by patient: Thread/unthread right pants leg, Thread/unthread left pants leg, Pull pants up/down Pants- Performed by helper: Pull pants up/down Non-skid slipper socks- Performed by patient: Don/doff right sock, Don/doff left sock Non-skid slipper socks- Performed by helper: Don/doff left sock, Don/doff right sock Socks - Performed by patient: Don/doff left sock, Don/doff right sock   Shoes - Performed  by patient: Don/doff right shoe, Don/doff left shoe, Fasten right, Fasten left            Lower body assist Assist for lower body dressing: Supervision or verbal cues      Toileting Toileting   Toileting steps completed by patient: Performs perineal hygiene Toileting steps completed by helper: Adjust clothing prior to toileting, Adjust clothing after  toileting Toileting Assistive Devices: Grab bar or rail  Toileting assist Assist level: Touching or steadying assistance (Pt.75%)   Transfers Chair/bed transfer   Chair/bed transfer method: Stand pivot Chair/bed transfer assist level: Touching or steadying assistance (Pt > 75%) Chair/bed transfer assistive device: Armrests     Locomotion Ambulation     Max distance:  (20 ft) Assist level: Touching or steadying assistance (Pt > 75%)   Wheelchair   Type: Manual Max wheelchair distance: 13ft Assist Level: Supervision or verbal cues  Cognition Comprehension Comprehension assist level: Follows basic conversation/direction with no assist  Expression Expression assist level: Expresses basic 75 - 89% of the time/requires cueing 10 - 24% of the time. Needs helper to occlude trach/needs to repeat words.  Social Interaction Social Interaction assist level: Interacts appropriately with others - No medications needed.  Problem Solving Problem solving assist level: Solves complex problems: With extra time  Memory Memory assist level: More than reasonable amount of time    Medical Problem List and Plan: 1. Left hemiparesis and speech/swallowing difficultiessecondary to embolic (?artery to artery)  right MCA infarct  Cont CIR PT, OT, SLP- Progressing with therapy with tent d/c 8/18     2. DVT Prophylaxis/Anticoagulation: Pharmaceutical: Lovenox,Brillinta 3. Pain Management: tylenol prn  4. Mood: LCSW to follow for evaluation and support. Team to provide ego support--showing signs of adjustment reaction. 5. Neuropsych: This patient appears to be capable of making decisions on hisown behalf. 6. Skin/Wound Care: routine pressure relief measures.  7. Fluids/Electrolytes/Nutrition: Monitor I/Os  BMP within acceptable range on 7/28 8. Glaucoma: Continue Xalatan as at home. 9. ABLA:   Hb 12.5 on 8/9  Cont to monitor 10. Elevated triglycerides: Now on Lipitor.  11. HTN: Controlled 8/10    Vitals:   12/12/16 1300 12/13/16 0600  BP: 126/71 126/62  Pulse: 67 65  Resp: 16 17  Temp: (!) 97.4 F (36.3 C) 98.1 F (36.7 C)  SpO2: 100% 100%   12. Dysphagia: pt on D3 diet per acute SLP recs: pt very dysarthric with poor oro-motor control. No swallowing problems reported 13. Hypoalbuminemia  Supplement initiated 7/28 14.  Mild neutropenia, afebrile , monitor, will recheck prior to d/c LOS (Days) 14 A FACE TO FACE EVALUATION WAS PERFORMED  Suzannah Bettes E 12/13/2016 7:05 AM

## 2016-12-13 NOTE — Progress Notes (Signed)
Social Work Patient ID: Jason Nolan, male   DOB: [birth year], 71 y.o.   MRN: [id number]   CSW met with pt 12-11-16 to update him on team conference discussion and left his wife a message 12-12-16 to let them know that pt is still on track for d/c on 12-21-16.  Pt is pleased with his progress and is still really focused on his speech, although CSW could understand him pretty well.  Pt is willing to keep working hard and CSW praised him for his motivation.  It would be easier for pt to start with home therapies and then transition to outpt as transportation will be challenging for pt since his wife doesn't drive.  CSW will continue to follow and assist as needed.

## 2016-12-13 NOTE — Progress Notes (Signed)
Speech Language Pathology Weekly Progress and Session Note  Patient Details  Name: Jason Nolan MRN: [id number] Date of Birth: December 25, 1945  Beginning of progress report period: December 06, 2016  End of progress report period: December 13, 2016  Today's Date: 12/13/2016 SLP Individual Time: 1105-1200 SLP Individual Time Calculation (min): 55 min  Short Term Goals: Week 2: SLP Short Term Goal 1 (Week 2): Pt will utilize overarticulation to achieve intelligibility at the phrase level with mod assist multimodal cues.   SLP Short Term Goal 1 - Progress (Week 2): Met SLP Short Term Goal 2 (Week 2): Pt will communicate his immediate needs and wants via multimodal means with supervision verbal cues.   SLP Short Term Goal 2 - Progress (Week 2): Met SLP Short Term Goal 3 (Week 2): Pt will recognize and correct verbal errors at the phrase level with mod assist verbal cues.   SLP Short Term Goal 3 - Progress (Week 2): Met    New Short Term Goals: Week 3: SLP Short Term Goal 1 (Week 3): Pt will utilize overarticulation to achieve intelligibility at the sentence level with min assist verbal cues.   SLP Short Term Goal 2 (Week 3): Pt will communicate his immediate needs and wants via multimodal means with mod I   SLP Short Term Goal 3 (Week 3): Pt will recognize and correct verbal errors at the phrase level with min assist verbal cues.    Weekly Progress Updates:  Pt has made functional gains this reporting and has met 3 out of 3 short term goals this reporting period.  Pt is demonstrating improved intelligibility at the phrase level with min assist verbal cues for use of compensatory strategies.  Pt education is ongoing.  As a result, pt would benefit from skilled ST while inpatient in order to maximize functional independence and reduce burden of care prior to discharge.       Intensity: Minumum of 1-2 x/day, 30 to 90 minutes Frequency: 3 to 5 out of 7 days Duration/Length of Stay:  8/18 Treatment/Interventions: English as a second language teacher;Dysphagia/aspiration precaution training;Functional tasks;Internal/external aids;Multimodal communication approach;Patient/family education;Speech/Language facilitation   Daily Session  Skilled Therapeutic Interventions: Pt was seen for skilled ST targeting communication goals.  Pt was intelligible at the phrase level during functional conversations in a moderately distracting environment with therapist with overall min verbal cues for overarticulation and slow rate.  Pt needed more cues for use of intelligibility strategies towards the end of today's therapy session which therapist suspects to be related to fatigue.  Discussed energy conservation strategies to preserve speech intelligibility.  Pt was returned to room and left in wheelchair with call bell within reach.  Goals updated on this date to reflect current progress and plan of care.        Function:   Eating Eating   Modified Consistency Diet: No Eating Assist Level: More than reasonable amount of time           Cognition Comprehension Comprehension assist level: Follows complex conversation/direction with extra time/assistive device  Expression   Expression assist level: Expresses basic 75 - 89% of the time/requires cueing 10 - 24% of the time. Needs helper to occlude trach/needs to repeat words.  Social Interaction Social Interaction assist level: Interacts appropriately with others - No medications needed.  Problem Solving Problem solving assist level: Solves complex problems: With extra time  Memory Memory assist level: More than reasonable amount of time    Therapy/Group: Individual Therapy  Page, Elmyra Ricks L 12/13/2016, 9:00 PM

## 2016-12-14 ENCOUNTER — Inpatient Hospital Stay (HOSPITAL_COMMUNITY): Payer: Medicare Other

## 2016-12-14 DIAGNOSIS — G8194 Hemiplegia, unspecified affecting left nondominant side: Secondary | ICD-10-CM

## 2016-12-14 NOTE — Progress Notes (Signed)
[place] PHYSICAL MEDICINE & REHABILITATION     PROGRESS NOTE  Subjective/Complaints:  Patient seen lying in bed this morning. He states he slept well overnight. He requests that located is going because he is unsure if they do something was retained.  ROS: Denies CP, SOB, N/V/D.  Objective: Vital Signs: Blood pressure (!) 152/82, pulse 72, temperature 97.9 F (36.6 C), temperature source Oral, resp. rate 16, height 5\' 10"  (1.778 m), weight 86 kg (189 lb 9.1 oz), SpO2 100 %. No results found.  Recent Labs  12/12/16 0412  WBC 3.3*  HGB 12.5*  HCT 38.8*  PLT 318    Recent Labs  12/12/16 0412  NA 141  K 3.8  CL 107  GLUCOSE 98  BUN 15  CREATININE 0.99  CALCIUM 8.8*   CBG (last 3)  No results for input(s): GLUCAP in the last 72 hours.  Wt Readings from Last 3 Encounters:  12/11/16 86 kg (189 lb 9.1 oz)  11/25/16 96.9 kg (213 lb 10 oz)  11/25/16 90.7 kg (200 lb)    Physical Exam:  BP (!) 152/82 (BP Location: Right Arm)   Pulse 72   Temp 97.9 F (36.6 C) (Oral)   Resp 16   Ht 5\' 10"  (1.778 m)   Wt 86 kg (189 lb 9.1 oz)   SpO2 100%   BMI 27.20 kg/m  Constitutional: He appears well-developedand well-nourished. No distress.  HENT: Normocephalicand atraumatic.  Eyes: EOMI. No discharge. Cardiovascular: RRR. No murmurheard. Respiratory: Effort normal breath sounds normal. GI: Soft. Bowel sounds are normal.  Musculoskeletal: He exhibits No edema, no tenderness  Neurological: He is alertand oriented Dysarthria.  Fair insight and awareness.  Left facial weakness  LUE: shoulder, elbow 4/5, hand, wrist 1+/5  RUE/RLE: 4/5 proximal to distal LLE: 4/5 HF, KE, ADF/PF Skin: Skin is warmand dry. He is not diaphoretic. Right inguinal scarring.  Psychiatric: Normal mood and affect.   Assessment/Plan: 1. Functional deficits secondary to embolic right MCA infarct which require 3+ hours per day of interdisciplinary therapy in a comprehensive inpatient rehab  setting. Physiatrist is providing close team supervision and 24 hour management of active medical problems listed below. Physiatrist and rehab team continue to assess barriers to discharge/monitor patient progress toward functional and medical goals.  Function:  Bathing Bathing position   Position: Shower  Bathing parts Body parts bathed by patient: Left arm, Right arm, Chest, Abdomen, Front perineal area, Buttocks, Left upper leg, Right lower leg, Left lower leg, Right upper leg Body parts bathed by helper: Back  Bathing assist Assist Level: Supervision or verbal cues      Upper Body Dressing/Undressing Upper body dressing   What is the patient wearing?: Pull over shirt/dress     Pull over shirt/dress - Perfomed by patient: Thread/unthread left sleeve, Thread/unthread right sleeve, Put head through opening, Pull shirt over trunk Pull over shirt/dress - Perfomed by helper: Pull shirt over trunk        Upper body assist Assist Level: Supervision or verbal cues      Lower Body Dressing/Undressing Lower body dressing   What is the patient wearing?: Underwear, Pants, Socks, Shoes Underwear - Performed by patient: Pull underwear up/down, Thread/unthread left underwear leg, Thread/unthread right underwear leg Underwear - Performed by helper: Pull underwear up/down Pants- Performed by patient: Thread/unthread right pants leg, Thread/unthread left pants leg, Pull pants up/down Pants- Performed by helper: Pull pants up/down Non-skid slipper socks- Performed by patient: Don/doff right sock, Don/doff left sock Non-skid slipper  socks- Performed by helper: Don/doff left sock, Don/doff right sock Socks - Performed by patient: Don/doff left sock, Don/doff right sock   Shoes - Performed by patient: Don/doff right shoe, Don/doff left shoe, Fasten right, Fasten left            Lower body assist Assist for lower body dressing: Supervision or verbal cues      Toileting Toileting    Toileting steps completed by patient: Performs perineal hygiene Toileting steps completed by helper: Adjust clothing prior to toileting, Adjust clothing after toileting Toileting Assistive Devices: Grab bar or rail  Toileting assist Assist level: Touching or steadying assistance (Pt.75%)   Transfers Chair/bed transfer   Chair/bed transfer method: Stand pivot Chair/bed transfer assist level: Supervision or verbal cues Chair/bed transfer assistive device: Armrests     Locomotion Ambulation     Max distance: 152ft Assist level: Touching or steadying assistance (Pt > 75%)   Wheelchair   Type: Manual Max wheelchair distance: 161ft Assist Level: Supervision or verbal cues  Cognition Comprehension Comprehension assist level: Follows complex conversation/direction with extra time/assistive device  Expression Expression assist level: Expresses basic 75 - 89% of the time/requires cueing 10 - 24% of the time. Needs helper to occlude trach/needs to repeat words.  Social Interaction Social Interaction assist level: Interacts appropriately with others - No medications needed.  Problem Solving Problem solving assist level: Solves complex problems: Recognizes & self-corrects  Memory Memory assist level: More than reasonable amount of time    Medical Problem List and Plan: 1. Left hemiparesis and speech/swallowing difficultiessecondary to embolic (?artery to artery)  right MCA infarct  Cont CIR  2. DVT Prophylaxis/Anticoagulation: Pharmaceutical: Lovenox,Brillinta 3. Pain Management: tylenol prn  4. Mood: LCSW to follow for evaluation and support. Team to provide ego support--showing signs of adjustment reaction. 5. Neuropsych: This patient appears to be capable of making decisions on hisown behalf. 6. Skin/Wound Care: routine pressure relief measures.  7. Fluids/Electrolytes/Nutrition: Monitor I/Os  BMP within acceptable range on 7/28 8. Glaucoma: Continue Xalatan as at home. 9. ABLA:    Hb 12.5 on 8/9  Cont to monitor 10. Elevated triglycerides: Now on Lipitor.  11. HTN: Overall controlled 8/11 Vitals:   12/13/16 1400 12/14/16 0436  BP: 128/65 (!) 152/82  Pulse: 80 72  Resp: 16 16  Temp: 98.4 F (36.9 C) 97.9 F (36.6 C)  SpO2: 100% 100%   12. Dysphagia: Resolved 13. Hypoalbuminemia  Supplement initiated 7/28 14.  Mild neutropenia   Afebrile   WBCs 3.3 on 8/9  Recheck prior to d/c  LOS (Days) 15 A FACE TO FACE EVALUATION WAS PERFORMED  Shamicka Inga Karis Juba 12/14/2016 7:36 AM

## 2016-12-14 NOTE — Progress Notes (Signed)
Occupational Therapy Session Note  Patient Details  Name: Jason Nolan MRN: [id number] Date of Birth: [birth year]  Today's Date: 12/14/2016 OT Individual Time: 9470-7615 and 1415-1500 OT Individual Time Calculation (min): 28 min and 45 min   Skilled Therapeutic Interventions/Progress Updates:    1:1. Pt ambulates throughotu session with RW with supervision-CGA for balance to gather clothing. Pt bathes at sit to stand level with supervision with A to wash back. Pt dresses with supervision seated [name] at sit to stand level with no LOB when advancing pants past hips. Pt grooms in standing at sink with VC to use LUE as stabilizer when applying toothpaste. Exited session with pt seated in w/c with call light in reach and half lap tray on w/c.  Session 2: 1;1 no c/o pain. Pt ambulate to/from all tx destinations with supervision/CGA with VC for LLE clearance and quicker pace. Pt completes ambulatory shower transfer with RW using posterior method and VC for safety awareness. Pt ambulates in kitchen with RW to gather designated items from various shelves above hip and shoulder height to simulate simple meal prep with supervision-CGA. In tx gym, pt completes 3x10 of towel scrunches with LUE to facilitate gross grasp with total A to extend fingers. Last set of 10, pt able to maintain grasp on washcloth and bring up to touch face to simulate grooming. Exited session with pt seated in w/c with call light in reach and all needs met.   Therapy Documentation Precautions:  Precautions Precautions: Fall Restrictions Weight Bearing Restrictions: No  See Function Navigator for Current Functional Status.   Therapy/Group: Individual Therapy  Tonny Branch 12/14/2016, 10:43 AM

## 2016-12-15 ENCOUNTER — Inpatient Hospital Stay (HOSPITAL_COMMUNITY): Payer: Medicare Other

## 2016-12-15 NOTE — Progress Notes (Signed)
[place] PHYSICAL MEDICINE & REHABILITATION     PROGRESS NOTE  Subjective/Complaints:  Patient seen lying in bed this morning. He slept well overnight. He states he is still sleepy this morning.  ROS: Denies CP, SOB, N/V/D.  Objective: Vital Signs: Blood pressure 119/67, pulse 76, temperature 98.6 F (37 C), temperature source Oral, resp. rate 18, height 5\' 10"  (1.778 m), weight 86 kg (189 lb 9.1 oz), SpO2 95 %. No results found. No results for input(s): WBC, HGB, HCT, PLT in the last 72 hours. No results for input(s): NA, K, CL, GLUCOSE, BUN, CREATININE, CALCIUM in the last 72 hours.  Invalid input(s): CO CBG (last 3)  No results for input(s): GLUCAP in the last 72 hours.  Wt Readings from Last 3 Encounters:  12/11/16 86 kg (189 lb 9.1 oz)  11/25/16 96.9 kg (213 lb 10 oz)  11/25/16 90.7 kg (200 lb)    Physical Exam:  BP 119/67 (BP Location: Right Arm)   Pulse 76   Temp 98.6 F (37 C) (Oral)   Resp 18   Ht 5\' 10"  (1.778 m)   Wt 86 kg (189 lb 9.1 oz)   SpO2 95%   BMI 27.20 kg/m  Constitutional: He appears well-developedand well-nourished. No distress.  HENT: Normocephalicand atraumatic.  Eyes: EOMI. No discharge. Cardiovascular: RRR. No murmurheard. Respiratory: Effort normal breath sounds normal. GI: Soft. Bowel sounds are normal.  Musculoskeletal: He exhibits No edema, no tenderness  Neurological: He is alertand oriented Dysarthria.  Fair insight and awareness.  Left facial weakness  LUE: shoulder, elbow 4/5, hand, wrist 1+/5 (stable) RUE/RLE: 4/5 proximal to distal LLE: 4/5 HF, KE, ADF/PF Skin: Skin is warmand dry. He is not diaphoretic. Right inguinal scarring.  Psychiatric: Normal mood and affect.   Assessment/Plan: 1. Functional deficits secondary to embolic right MCA infarct which require 3+ hours per day of interdisciplinary therapy in a comprehensive inpatient rehab setting. Physiatrist is providing close team supervision and 24 hour management  of active medical problems listed below. Physiatrist and rehab team continue to assess barriers to discharge/monitor patient progress toward functional and medical goals.  Function:  Bathing Bathing position   Position: Shower  Bathing parts Body parts bathed by patient: Left arm, Right arm, Chest, Abdomen, Front perineal area, Buttocks, Left upper leg, Right lower leg, Left lower leg, Right upper leg Body parts bathed by helper: Back  Bathing assist Assist Level: Supervision or verbal cues      Upper Body Dressing/Undressing Upper body dressing   What is the patient wearing?: Pull over shirt/dress     Pull over shirt/dress - Perfomed by patient: Thread/unthread left sleeve, Thread/unthread right sleeve, Put head through opening, Pull shirt over trunk Pull over shirt/dress - Perfomed by helper: Pull shirt over trunk        Upper body assist Assist Level: Supervision or verbal cues      Lower Body Dressing/Undressing Lower body dressing   What is the patient wearing?: Underwear, Pants, Socks, Shoes Underwear - Performed by patient: Pull underwear up/down, Thread/unthread left underwear leg, Thread/unthread right underwear leg Underwear - Performed by helper: Pull underwear up/down Pants- Performed by patient: Thread/unthread right pants leg, Thread/unthread left pants leg, Pull pants up/down Pants- Performed by helper: Pull pants up/down Non-skid slipper socks- Performed by patient: Don/doff right sock, Don/doff left sock Non-skid slipper socks- Performed by helper: Don/doff left sock, Don/doff right sock Socks - Performed by patient: Don/doff left sock, Don/doff right sock   Shoes - Performed by patient:  Don/doff right shoe, Don/doff left shoe, Fasten right, Fasten left            Lower body assist Assist for lower body dressing: Supervision or verbal cues      Toileting Toileting   Toileting steps completed by patient: Adjust clothing prior to toileting, Performs  perineal hygiene, Adjust clothing after toileting Toileting steps completed by helper: Adjust clothing prior to toileting, Adjust clothing after toileting Toileting Assistive Devices: Grab bar or rail  Toileting assist Assist level: Supervision or verbal cues   Transfers Chair/bed transfer   Chair/bed transfer method: Stand pivot Chair/bed transfer assist level: Supervision or verbal cues Chair/bed transfer assistive device: Armrests     Locomotion Ambulation     Max distance: 17650ft Assist level: Touching or steadying assistance (Pt > 75%)   Wheelchair   Type: Manual Max wheelchair distance: 15450ft Assist Level: Supervision or verbal cues  Cognition Comprehension Comprehension assist level: Follows complex conversation/direction with extra time/assistive device  Expression Expression assist level: Expresses basic 75 - 89% of the time/requires cueing 10 - 24% of the time. Needs helper to occlude trach/needs to repeat words.  Social Interaction Social Interaction assist level: Interacts appropriately with others - No medications needed.  Problem Solving Problem solving assist level: Solves complex problems: Recognizes & self-corrects  Memory Memory assist level: More than reasonable amount of time    Medical Problem List and Plan: 1. Left hemiparesis and speech/swallowing difficultiessecondary to embolic (?artery to artery)  right MCA infarct  Cont CIR  2. DVT Prophylaxis/Anticoagulation: Pharmaceutical: Lovenox,Brillinta 3. Pain Management: tylenol prn  4. Mood: LCSW to follow for evaluation and support. Team to provide ego support--showing signs of adjustment reaction. 5. Neuropsych: This patient appears to be capable of making decisions on hisown behalf. 6. Skin/Wound Care: routine pressure relief measures.  7. Fluids/Electrolytes/Nutrition: Monitor I/Os  BMP within acceptable range on 8/9 8. Glaucoma: Continue Xalatan as at home. 9. ABLA:   Hb 12.5 on 8/9  Cont to  monitor 10. Elevated triglycerides: Now on Lipitor.  11. HTN: Overall controlled 8/12 Vitals:   12/14/16 1510 12/15/16 0627  BP: 120/63 119/67  Pulse: 67 76  Resp: 17 18  Temp: 98.2 F (36.8 C) 98.6 F (37 C)  SpO2: 100% 95%   12. Dysphagia: Resolved 13. Hypoalbuminemia  Supplement initiated 7/28 14.  Mild neutropenia   Afebrile   WBCs 3.3 on 8/9  Recheck prior to d/c  LOS (Days) 16 A FACE TO FACE EVALUATION WAS PERFORMED  Ankit Karis Jubanil Patel 12/15/2016 7:16 AM

## 2016-12-15 NOTE — Progress Notes (Signed)
Physical Therapy Session Note  Patient Details  Name: Jason Nolan MRN: [id number] Date of Birth: [birth year]  Today's Date: 12/15/2016 PT Individual Time: 1400-1500 PT Individual Time Calculation (min): 60 min   Short Term Goals: Week 3:  PT Short Term Goal 1 (Week 3): Equal LTGs  Skilled Therapeutic Interventions/Progress Updates:    Pt seated in w/c upon PT arrival, agreeable to therapy tx and denies pain. Pt propelled w/c to the gym x 150 ft using B LEs and supervision. Pt transferred to cybex kinetron with min assist, worked on dynamic balance without UE support, weight shifting and equal weightbearing through LEs, marching, and 3 x 10 mini squats with theraband for abduction, all for LE NMR. Pt ambulated Ambulation with SPC x 80 ft, verbal cues for L foot clearance. Pt performed 2 x 10 ankle DF with orange theraband for L ankle NMR. Pt transferred from chair to quadruped with elbows propped on bench with min assist. In modified quadruped pt performed 2 x 10 hip extension on each LE for NMR and core activation, also working on L UE weightbearing. Pt performed tall kneeling side stepping with UE support for balance to work on hip strengthening and dynamic balance. Pt left seated in w/c in room with needs in reach and family present.   Therapy Documentation Precautions:  Precautions Precautions: Fall Restrictions Weight Bearing Restrictions: No   See Function Navigator for Current Functional Status.   Therapy/Group: Individual Therapy  Cresenciano GenreEmily van Schagen, PT, DPT 12/15/2016, 3:01 PM

## 2016-12-16 ENCOUNTER — Inpatient Hospital Stay (HOSPITAL_COMMUNITY): Payer: Medicare Other | Admitting: Physical Therapy

## 2016-12-16 ENCOUNTER — Inpatient Hospital Stay (HOSPITAL_COMMUNITY): Payer: Medicare Other

## 2016-12-16 ENCOUNTER — Inpatient Hospital Stay (HOSPITAL_COMMUNITY): Payer: Medicare Other | Admitting: Speech Pathology

## 2016-12-16 ENCOUNTER — Inpatient Hospital Stay (HOSPITAL_COMMUNITY): Payer: Medicare Other | Admitting: Occupational Therapy

## 2016-12-16 NOTE — Progress Notes (Signed)
Physical Therapy Session Note  Patient Details  Name: Jason Nolan MRN: [id number] Date of Birth: 1946-03-18  Today's Date: 12/16/2016 PT Individual Time: [id number] PT Individual Time Calculation (min): 30 min   Short Term Goals: Week 3:  PT Short Term Goal 1 (Week 3): Equal LTGs  Skilled Therapeutic Interventions/Progress Updates:    no c/o pain. PT treatment session focused on pt education with floor transfers, LE strengthening, and ambulation.  Pt sitting in w/c upon arrival, agreeable to PT. PT pushed pt in w/c to therapy gym for time and energy conservation. Stand pivot transfers w/c <> mat with close supervision for safety throughout session. Pt education on when to call EMS in the event of a fall at home. Pt performed floor transfer from supine to sitting EOM with supervision and verbal cues for sequencing and attention to L UE wrist positioning during weight bearing. Seated L LE dorsiflexion with theraband resistance 2x to fatigue focused on endurance. Sit to stands with theraband around knees working on hip abduction x15 progressed to R LE propped on step for increased use of L LE x15. Amulated 15250ft to [facility] with min assist for steadying. Pt left sitting in w/c with call bell in reach.   Therapy Documentation Precautions:  Precautions Precautions: Fall Restrictions Weight Bearing Restrictions: No  See Function Navigator for Current Functional Status.   Therapy/Group: Individual Therapy  [name] 12/16/2016, 3:54 PM

## 2016-12-16 NOTE — Progress Notes (Signed)
[facility]ILIT[facility]     PROGRESS NOTE  Subjective/Complaints:  Pt seen sitting at [name] this AM.  He slept well overnight.  He is ready to resume therapies.   ROS: Denies CP, SOB, N/V/D.  Objective: Vital Signs: Blood pressure 130/68, pulse 73, temperature 98.4 F (36.9 C), temperature source Oral, resp. rate 18, height 5\' 10"  (1.778 m), weight 86 kg (189 lb 9.1 oz), SpO2 98 %. No results found. No results for input(s): WBC, HGB, HCT, PLT in the last 72 hours. No results for input(s): NA, K, CL, GLUCOSE, BUN, CREATININE, CALCIUM in the last 72 hours.  Invalid input(s): CO CBG (last 3)  No results for input(s): GLUCAP in the last 72 hours.  Wt Readings from Last 3 Encounters:  12/11/16 86 kg (189 lb 9.1 oz)  11/25/16 96.9 kg (213 lb 10 oz)  11/25/16 90.7 kg (200 lb)    Physical Exam:  BP 130/68 (BP Location: Right Arm)   Pulse 73   Temp 98.4 F (36.9 C) (Oral)   Resp 18   Ht 5\' 10"  (1.778 m)   Wt 86 kg (189 lb 9.1 oz)   SpO2 98%   BMI 27.20 kg/m  Constitutional: He appears well-developedand well-nourished. No distress.  HENT: Normocephalicand atraumatic.  Eyes: EOMI. No discharge. Cardiovascular: RRR. No murmurheard. Respiratory: Effort normal breath sounds normal. GI: Soft. Bowel sounds are normal.  Musculoskeletal: He exhibits No edema, no tenderness  Neurological: He is alertand oriented Dysarthria.  Fair insight and awareness.  Left facial weakness  LUE: shoulder, elbow 4/5, hand, wrist 1+/5 (unchanged) RUE/RLE: 4/5 proximal to distal LLE: 4/5 HF, KE, ADF/PF Skin: Skin is warmand dry. He is not diaphoretic. Right inguinal scarring.  Psychiatric: Normal mood and affect.   Assessment/Plan: 1. Functional deficits secondary to embolic right MCA infarct which require 3+ hours per day of interdisciplinary therapy in a comprehensive inpatient rehab setting. Physiatrist is providing close team supervision and 24 hour management of  active medical problems listed below. Physiatrist and rehab team continue to assess barriers to discharge/monitor patient progress toward functional and medical goals.  Function:  Bathing Bathing position   Position: Shower  Bathing parts Body parts bathed by patient: Left arm, Right arm, Chest, Abdomen, Front perineal area, Buttocks, Left upper leg, Right lower leg, Left lower leg, Right upper leg Body parts bathed by helper: Back  Bathing assist Assist Level: Supervision or verbal cues      Upper Body Dressing/Undressing Upper body dressing   What is the patient wearing?: Pull over shirt/dress     Pull over shirt/dress - Perfomed by patient: Thread/unthread left sleeve, Thread/unthread right sleeve, Put head through opening, Pull shirt over trunk Pull over shirt/dress - Perfomed by helper: Pull shirt over trunk        Upper body assist Assist Level: Supervision or verbal cues      Lower Body Dressing/Undressing Lower body dressing   What is the patient wearing?: Underwear, Pants, Socks, Shoes Underwear - Performed by patient: Pull underwear up/down, Thread/unthread left underwear leg, Thread/unthread right underwear leg Underwear - Performed by helper: Pull underwear up/down Pants- Performed by patient: Thread/unthread right pants leg, Thread/unthread left pants leg, Pull pants up/down Pants- Performed by helper: Pull pants up/down Non-skid slipper socks- Performed by patient: Don/doff right sock, Don/doff left sock Non-skid slipper socks- Performed by helper: Don/doff left sock, Don/doff right sock Socks - Performed by patient: Don/doff left sock, Don/doff right sock   Shoes - Performed by  patient: Don/doff right shoe, Don/doff left shoe, Fasten right, Fasten left            Lower body assist Assist for lower body dressing: Supervision or verbal cues      Toileting Toileting   Toileting steps completed by patient: Adjust clothing prior to toileting, Performs  perineal hygiene, Adjust clothing after toileting Toileting steps completed by helper: Adjust clothing prior to toileting, Adjust clothing after toileting Toileting Assistive Devices: Grab bar or rail  Toileting assist Assist level: Supervision or verbal cues   Transfers Chair/bed transfer   Chair/bed transfer method: Stand pivot Chair/bed transfer assist level: Supervision or verbal cues Chair/bed transfer assistive device: Armrests, Cane     Locomotion Ambulation     Max distance: 80 ft Assist level: Touching or steadying assistance (Pt > 75%)   Wheelchair   Type: Manual Max wheelchair distance: 12150ft Assist Level: Supervision or verbal cues  Cognition Comprehension Comprehension assist level: Follows complex conversation/direction with extra time/assistive device  Expression Expression assist level: Expresses basic 75 - 89% of the time/requires cueing 10 - 24% of the time. Needs helper to occlude trach/needs to repeat words.  Social Interaction Social Interaction assist level: Interacts appropriately with others - No medications needed.  Problem Solving Problem solving assist level: Solves complex problems: Recognizes & self-corrects  Memory Memory assist level: More than reasonable amount of time    Medical Problem List and Plan: 1. Left hemiparesis and speech/swallowing difficultiessecondary to embolic (?artery to artery)  right MCA infarct  Cont CIR  2. DVT Prophylaxis/Anticoagulation: Pharmaceutical: Lovenox,Brillinta 3. Pain Management: tylenol prn  4. Mood: LCSW to follow for evaluation and support. Team to provide ego support--showing signs of adjustment reaction. 5. Neuropsych: This patient appears to be capable of making decisions on hisown behalf. 6. Skin/Wound Care: routine pressure relief measures.  7. Fluids/Electrolytes/Nutrition: Monitor I/Os  BMP within acceptable range on 8/9  Eating well at present 8. Glaucoma: Continue Xalatan as at home. 9. ABLA:    Hb 12.5 on 8/9  Cont to monitor 10. Elevated triglycerides: Now on Lipitor.  11. HTN: Overall controlled 8/13 Vitals:   12/15/16 2000 12/16/16 0507  BP: 128/68 130/68  Pulse: 77 73  Resp:  18  Temp:  98.4 F (36.9 C)  SpO2: 98% 98%   12. Dysphagia: Resolved 13. Hypoalbuminemia  Supplement initiated 7/28 14.  Mild neutropenia  Afebrile   WBCs 3.3 on 8/9  Recheck prior to d/c  LOS (Days) 17 A FACE TO FACE EVALUATION WAS PERFORMED  Permelia Bamba Karis Jubanil Dnyla Antonetti 12/16/2016 8:39 AM

## 2016-12-16 NOTE — Progress Notes (Signed)
Speech Language Pathology Daily Session Note  Patient Details  Name: Jason Nolan MRN: [id number] Date of Birth: 1946-02-11  Today's Date: 12/16/2016 SLP Individual Time: [id number] SLP Individual Time Calculation (min): 45 min  Short Term Goals: Week 3: SLP Short Term Goal 1 (Week 3): Pt will utilize overarticulation to achieve intelligibility at the sentence level with min assist verbal cues.   SLP Short Term Goal 2 (Week 3): Pt will communicate his immediate needs and wants via multimodal means with mod I   SLP Short Term Goal 3 (Week 3): Pt will recognize and correct verbal errors at the phrase level with min assist verbal cues.    Skilled Therapeutic Interventions: Skilled treatment session focused on speech intelligibility goals. SLP facilitated session by providing Mod I to supervision cues to increase speech intelligibility to ~ 95% at simple conversation. Pt was returned to room, left upright in wheelchair with all needs within reach. Continue per current plan of care.   Function:    Cognition Comprehension Comprehension assist level: Follows complex conversation/direction with extra time/assistive device  Expression   Expression assist level: Expresses basic needs/ideas: With extra time/assistive device;Expresses basic 90% of the time/requires cueing < 10% of the time.  Social Interaction Social Interaction assist level: Interacts appropriately with others - No medications needed.  Problem Solving Problem solving assist level: Solves complex problems: Recognizes & self-corrects  Memory Memory assist level: More than reasonable amount of time    Pain    Therapy/Group: Individual Therapy  Vanya Carberry 12/16/2016, 11:41 AM

## 2016-12-16 NOTE — Progress Notes (Signed)
Occupational Therapy Session Note  Patient Details  Name: Jason Nolan MRN: [id number] Date of Birth: 1945/06/14  Today's Date: 12/16/2016 OT Individual Time: [id number] OT Individual Time Calculation (min): 60 min    Short Term Goals: Week 2:  OT Short Term Goal 1 (Week 2): Pt will utilize L UE as a stabilizer with min questioning cues OT Short Term Goal 2 (Week 2): Pt will complete L UE self-ROM without assistance OT Short Term Goal 3 (Week 2): Pt will maintain standing balance with 50% supervision while standing to pull up pants.   Skilled Therapeutic Interventions/Progress Updates:    Treatment session with focus on functional mobility, standing balance, and functional use of LUE.  Pt ambulated around room with RW with supervision and min guard when navigating tight spaces.  Pt obtained clothing prior to ambulating to room shower.  Completed bathing with supervision at sit > stand level with use of LUE as gross assist with bathing.  Dressing completed at sit > stand level with supervision/setup.  Grooming tasks completed in standing for increased standing balance while educating on weight bearing through LUE when not in use as gross assist for setup.  Applied NMES to wrist extensors and finger extensors to facilitate extension.  Discussed research behind NMES as well as encouraged PROM/AAROM to continue to increase motor return of LUE.  Provided pt with self-ROM handout and fine motor control handout with focus on wrist and finger flexion and extension.   Therapy Documentation Precautions:  Precautions Precautions: Fall Restrictions Weight Bearing Restrictions: No Pain:  Pt with no c/o pain  See Function Navigator for Current Functional Status.   Therapy/Group: Individual Therapy  Rosalio LoudHOXIE, Larisa Lanius 12/16/2016, 10:51 AM

## 2016-12-16 NOTE — Progress Notes (Signed)
Physical Therapy Session Note  Patient Details  Name: Jason Nolan MRN: [id number] Date of Birth: [birth year]  Today's Date: 12/16/2016 PT Individual Time: 1100-1200 PT Individual Time Calculation (min): 60 min   Short Term Goals: Week 3:  PT Short Term Goal 1 (Week 3): Equal LTGs  Skilled Therapeutic Interventions/Progress Updates:    Pt sitting in w/c upon PT arrival, agreeable to therapy tx. Pt ambulated 2x 4050ft working on obstacle navigation by turning around cones and stepping over yardsticks on the floor, pt requiring verbal cues at times for toe clearance on L LE (x 1 with SPC and x 1 without AD). Pt performed 2 x 10 DF with orange theraband for DF strengthening. Pt standing on bosu ball working on dynamic balance with and without UE support, performed 2 x 10 mini squat with verbal cues for technique. Pt standing on bosu ball rocking back and forth from PF to DF to work on ankle ROM and stability. Pt ambulated to the mat x 10 ft using SPC. Pt transferred from standing to tall kneeling on mat with min assist, in tall kneeling (x 1 on each side) pt performed 2 x 10 chops with UEs to work on coordination, core activation, LE NMR and balance. Pt transferred to quadruped, with assistance for L UE support and performed 2 x 10 hip extension with each LE. Pt ambulated x 80 ft using SPC with verbal cues for step length, speed and toe clearance. Pt propelled w/c back to room using B LEs. Educated pt on seated hamstring stretch to performed 2 x 30 sec. Pt left seated in w/c with needs in reach.   Therapy Documentation Precautions:  Precautions Precautions: Fall Restrictions Weight Bearing Restrictions: No   See Function Navigator for Current Functional Status.   Therapy/Group: Individual Therapy  Cresenciano GenreEmily van Schagen, PT, DPT 12/16/2016, 12:49 PM

## 2016-12-17 ENCOUNTER — Inpatient Hospital Stay (HOSPITAL_COMMUNITY): Payer: Medicare Other | Admitting: Physical Therapy

## 2016-12-17 ENCOUNTER — Inpatient Hospital Stay (HOSPITAL_COMMUNITY): Payer: Medicare Other | Admitting: Occupational Therapy

## 2016-12-17 ENCOUNTER — Encounter (HOSPITAL_COMMUNITY): Payer: Medicare Other | Admitting: Psychology

## 2016-12-17 ENCOUNTER — Inpatient Hospital Stay (HOSPITAL_COMMUNITY): Payer: Medicare Other | Admitting: Speech Pathology

## 2016-12-17 DIAGNOSIS — R4701 Aphasia: Secondary | ICD-10-CM

## 2016-12-17 DIAGNOSIS — D709 Neutropenia, unspecified: Secondary | ICD-10-CM

## 2016-12-17 NOTE — Progress Notes (Signed)
Physical Therapy Session Note  Patient Details  Name: Jason Nolan MRN: [id number] Date of Birth: 01/03/1946  Today's Date: 12/17/2016 PT Individual Time: 0815-0905 AND 1400-1427 PT Individual Time Calculation (min): 50 min AND 27 min  Short Term Goals: Week 3:  PT Short Term Goal 1 (Week 3): Equal LTGs  Skilled Therapeutic Interventions/Progress Updates:   Session 1:  Pt sitting [name] upon arrival and agreeable to therapy, no c/o pain. Pt ambulated to therapy gym 200' w/ RW and supervision. Worked on [employer] in standing this session and endurance w/ functional mobility. NMR w/ dynamic standing balance performing unilateral UE tasks w/o UE support on foam incline and w/ close supervision, 5 min x3. Tandem walking w/ and w/o UE support, Min A to prevent LOB. Emphasis on heel-to-toe walking and exaggerating LLE DF during swing phase. Seated rest breaks in between bouts secondary to fatigue and decreased LLE muscular endurance (increase L toe drag w/ fatigue). Ambulated back to room 50' w/ single point cane and supervision and 150' Min guard w/o AD and while holding cup of water. Ended session sitting in w/c, call bell within reach and all needs met.   Session 2:  Pt in w/c upon arrival and agreeable to therapy, no c/o pain. Worked on functional endurance and mobility this session. Ambulated to/from therapy gym w/ single point cane and supervision, verbal cues to increase L DF during swing phase. Practiced floor transfers w/ supervision w/ surface to assist in rising from floor, educated pt on technique if close to a surface or away from a surface to assist. Pt verbalized understanding and returned demonstration. Additionally practiced negotiating ramp and compliant surface w/ single point cane and close supervision. Ended session in w/c in care of wife, all needs met.   Therapy Documentation Precautions:  Precautions Precautions: Fall Restrictions Weight Bearing Restrictions: No Pain: Pain  Assessment Pain Assessment: No/denies pain  See Function Navigator for Current Functional Status.   Therapy/Group: Individual Therapy  [name] K Arnette 12/17/2016, 1:44 PM

## 2016-12-17 NOTE — Progress Notes (Addendum)
Occupational Therapy Weekly Progress Note  Patient Details  Name: Jason Nolan MRN: [id number] Date of Birth: [birth year]  Beginning of progress report period: November 30, 2016 End of progress report period: December 17, 2016  Today's Date: 12/17/2016 OT Individual Time: 1101-1201 OT Individual Time Calculation (min): 60 min    Patient has met 3 of 3 short term goals.  Pt is making great progress with OT treatments at this time.  He is ambulating to the bathroom with close supervision, and completing bathing/dressing at supervision level. LUE function continues to improve, but continues to have limited distal movement in hand. He is able to grasp large objects now, but unable to release.   Feel he is on target to reach modified independent level goals for discharge home this week.  Will continue with current OT treatment POC.     Patient continues to demonstrate the following deficits: muscle weakness, abnormal tone and unbalanced muscle activation and decreased standing balance, hemiplegia and decreased balance strategies and therefore will continue to benefit from skilled OT intervention to enhance overall performance with BADL.  Patient progressing toward long term goals..  Continue plan of care.  OT Short Term Goals Week 2:  OT Short Term Goal 1 (Week 2): Pt will utilize L UE as a stabilizer with min questioning cues OT Short Term Goal 1 - Progress (Week 2): Met OT Short Term Goal 2 (Week 2): Pt will complete L UE self-ROM without assistance OT Short Term Goal 2 - Progress (Week 2): Met OT Short Term Goal 3 (Week 2): Pt will maintain standing balance with 50% supervision while standing to pull up pants.  OT Short Term Goal 3 - Progress (Week 2): Met Week 3:  OT Short Term Goal 1 (Week 3): LTG=STG 2/2 ELOS  Skilled Therapeutic Interventions/Progress Updates:    OT treatment session focused on dc planning, modified bathing/dressing, L NMR, and functional use of L UE. OT discussed home  set-up and DME needs with pt. Pt states he has a built in shower ledge in master bedroom, will need to clarify if this is for sitting. Pt ambulated to shower without AD and close supervision. Bathing completed at overall supervision level and was able to grasp wash cloth today. [employer] forced use of L UE. Pt able to complete dressing tasks with supervision as well. Worked on L lateral pinch and facilitated normal movement patterns with wash cloth folding task. Pt left seated in wc at end of session with needs met and spouse present.   Therapy Documentation Precautions:  Precautions Precautions: Fall Restrictions Weight Bearing Restrictions: No Pain: Pain Assessment Pain Assessment: No/denies pain  See Function Navigator for Current Functional Status.   Therapy/Group: Individual Therapy  Valma Cava 12/17/2016, 7:00 AM

## 2016-12-17 NOTE — Consult Note (Signed)
Neuropsychological Consultation   Patient:   Jason Nolan   DOB:   26-Oct-1945  MR Number:  [id number]  Location:  [facility] [address] [facility] [address] [id number] [place] [address] Dept: [phone] Loc: [phone]           Date of Service:   12/17/2016  Start Time:   1 PM End Time:   2 PM  Provider/Observer:  Arley Phenix, Psy.D.       Clinical Neuropsychologist       Billing Code/Service: [phone] 4 Units  Chief Complaint:    Jason Nolan is a 71 year old left-handed male referred for neuropsychological consultation primarily due to coping and adjustment issues following a stroke that occurred on 11/25/2016. The patient has a history of hepatitis C treated with Harvoni and glaucoma but otherwise generally healthy individual. The patient was admitted to [facility] with left-sided weakness, facial droop and slurred speech. These changes related to right-MCA territory infarct. The region affected was primarily the right frontal lobe region infarct. He underwent cerebral angiogram with complete revascularization of the occluded superior division of the right MCA and near complete revascularization of the proximal right ICA with stent assisted angioplasty. MRI suggested right frontal as well as right parietal lobe and right occipital lobe along with other brain regions were involved. The patient continued to display left-sided weakness, expressive deficits with anomia and dysphagia. He was recommended to the comprehensive inpatient rehabilitation program due to deficits in mobility and ability to carry out ADLs.  Reason for Service:  The patient was referred for neuropsychological consultation due to acute effects of a right MCA and right ICA infarct..  Below is the complete history of present illness for the current admission.   HPI: Jason Nolan a 71 y.o.left handed male with history of Hep  C-treated with Harvoni, glaucoma who was admitted via [facility] on 7/23 with left sided weakness, facial droop and slurred speech due to early changes of R-MCA territory infarct. He underwent CTA brain showed short segment near occlusion of proximal R-ICA with associated intraluminal thrombus --likely due to dissection and emergent large vessel occlusion with right M2 occlusion and evolving right frontal lobe infarct. He underwent cerebral angio with complete revascularization of occluded superior division of R-MCA and near complete revascularization of proximal R-ICA with stent assisted angioplasty. MRI brain done revealing right insula and frontal parietal junction acute infarct, several punctate foci of acute infarct in right frontal lobe, right parietal lobe, right occipital lobe, and left superior cerebellar hemisphere and stable right sylvian/posterior frontal convexity extra axial signal from extravasated contrast and/or SAH.  He tolerated extubation on 7/25. 2 D echo with EF 60-65% with no wall abnormality. Carotid dopplers showed 40-50% right proximal ICA stenosis, L- 1-39% stenosis and elevated velocities in B-VA. Stroke felt to be embolic in setting of ruptured plaque R-ICA and patient on ASA and brillinta due to stent. MBS done and patient started on dysphagia 3, thin liquids. Patient with resultant left sided weakness, expressive deficits with aphonia and dysphagia. Therapy evaluations done 7/26and CIR recommended due to deficits in mobility and ability to carry out ADL tasks  Current Status:  The patient continues to have significant motor deficits with regard to his right hand primarily but also reduced function and reduced strength in his right arm. He has more movement and control and his upper arms in his hand. The patient is able to close his left hand but has  difficulty opening it. The patient has more function but continued deficits with regard to his left leg. He reports that he has  difficulty raising his foot up but is able to push down. The patient also shows significant expressive language deficits with some word finding difficulties but clear articulation errors as well. The patient did not display any particular circumlocutions or paraphrasing cares but more word finding difficulties and articulation errors along with slowed speech production.  Reliability of Information: Information is derived from a 1 hour face-to-face clinical interview with the patient as well as review of available medical records and discussion with treatment team members.  Behavioral Observation: Jason Nolan  presents as a 71 y.o.-year-old Left African American Male who appeared his stated age. his dress was Appropriate and he was Well Groomed and his manners were Appropriate to the situation.  his participation was indicative of Appropriate and Attentive behaviors.  There were physical disabilities noted.  he displayed an appropriate level of cooperation and motivation.     Interactions:    Active Appropriate and Attentive  Attention:   within normal limits and attention span and concentration were age appropriate  Memory:   within normal limits; recent and remote memory intact  Visuo-spatial:  not examined  Speech (Volume):  low  Speech:   non-fluent aphasia; slurred  Thought Process:  Coherent and Relevant  Though Content:  WNL; not suicidal  Orientation:   person, place, time/date and situation  Judgment:   Good  Planning:   Good  Affect:    Appropriate  Mood:    NA  Insight:   Good  Intelligence:   high  Medical History:   Past Medical History:  Diagnosis Date  . Glaucoma   . Hepatitis C    Psychiatric History:  No prior history of psychiatric illness.  Family Med/Psych History:  Family History  Problem Relation Age of Onset  . Hypertension Mother   . Hypertension Father     Risk of Suicide/Violence: virtually non-existent no reports or indications of  suicidal or homicidal ideation.  Impression/DX:  Jason Nolan is a 71 year old left-handed male referred for neuropsychological consultation primarily due to coping and adjustment issues following a stroke that occurred on 11/25/2016. The patient has a history of hepatitis C treated with Harvoni and glaucoma but otherwise generally healthy individual. The patient was admitted to [facility] with left-sided weakness, facial droop and slurred speech. These changes related to right-MCA territory infarct. The region affected was primarily the right frontal lobe region infarct. He underwent cerebral angiogram with complete revascularization of the occluded superior division of the right MCA and near complete revascularization of the proximal right ICA with stent assisted angioplasty. MRI suggested right frontal as well as right parietal lobe and right occipital lobe along with other brain regions were involved. The patient continued to display left-sided weakness, expressive deficits with anomia and dysphagia. He was recommended to the comprehensive inpatient rehabilitation program due to deficits in mobility and ability to carry out ADLs.  Today, the patient clearly showed expressive language deficits. There were no indications of circumlocutions or paraphrasing cares but more very slowed speech production and difficulties with word finding abilities and articulation errors. The patient also displayed clear motor function deficits with his right arm most notably in the lower extremity having to do with hand control and hand function. His upper arm had more control. The patient had difficulty opening this and was able to grasp his hand close with good  strength. The patient also displayed impairments with regard to his left leg but reports that there've been significant improvements. He did have some difficulty raising his foot but was able to push down adequately. The patient reported that his  memory and thinking was generally good and that he was not experiencing any memory deficits. He did acknowledge and was clearly aware of his expressive language deficits. We reviewed issues today with regard to adjustment and adaptation to the current deficits and a plan for going forward as far as rehabilitation. The patient has had significant improvement over the past several days and this is a very positive sign for ongoing improvement. He is expected to be discharged sometime around the 18th and will be able to follow through with ongoing rehabilitation efforts once he is discharged. The patient did not appear to be dealing with any significant anxiety or depression at this time and he did report that he has been able to cope fairly well and understands what is happened to a general degree. He had some concerns about why this happened to him and we reviewed some of the issues that may have contributed to the stroke event.  Disposition/Plan:  The patient is to be discharged approximately on August 18 and we will make sure that he is set up for follow-up outpatient rehabilitation services.           Electronically Signed   _______________________ Arley PhenixJohn Buster Schueller, Psy.D.

## 2016-12-17 NOTE — Progress Notes (Signed)
[facility] PHYSICAL MEDICINE & REHABILITATION     PROGRESS NOTE  Subjective/Complaints:  Pt seen sitting up at the [name] eating breakfast.  He slept well overnight.  Deneis complaints this AM.    ROS: Denies CP, SOB, N/V/D.  Objective: Vital Signs: Blood pressure 125/87, pulse 78, temperature 97.9 F (36.6 C), temperature source Oral, resp. rate 18, height 5\' 10"  (1.778 m), weight 86 kg (189 lb 9.1 oz), SpO2 98 %. No results found. No results for input(s): WBC, HGB, HCT, PLT in the last 72 hours. No results for input(s): NA, K, CL, GLUCOSE, BUN, CREATININE, CALCIUM in the last 72 hours.  Invalid input(s): CO CBG (last 3)  No results for input(s): GLUCAP in the last 72 hours.  Wt Readings from Last 3 Encounters:  12/11/16 86 kg (189 lb 9.1 oz)  11/25/16 96.9 kg (213 lb 10 oz)  11/25/16 90.7 kg (200 lb)    Physical Exam:  BP 125/87 (BP Location: Right Arm)   Pulse 78   Temp 97.9 F (36.6 C) (Oral)   Resp 18   Ht 5\' 10"  (1.778 m)   Wt 86 kg (189 lb 9.1 oz)   SpO2 98%   BMI 27.20 kg/m  Constitutional: He appears well-developedand well-nourished. No distress.  HENT: Normocephalicand atraumatic.  Eyes: EOMI. No discharge. Cardiovascular: RRR. No murmurheard. Respiratory: Effort normal breath sounds normal. GI: Soft. Bowel sounds are normal.  Musculoskeletal: He exhibits No edema, no tenderness  Neurological: He is alertand oriented Dysarthria.  Fair insight and awareness.  Left facial weakness  LUE: shoulder, elbow 4/5, hand, wrist 1+/5 (stable) RUE/RLE: 4/5 proximal to distal LLE: 4/5 HF, KE, ADF/PF Skin: Skin is warmand dry. He is not diaphoretic. Right inguinal scarring.  Psychiatric: Normal mood and affect.   Assessment/Plan: 1. Functional deficits secondary to embolic right MCA infarct which require 3+ hours per day of interdisciplinary therapy in a comprehensive inpatient rehab setting. Physiatrist is providing close team supervision and 24 hour management  of active medical problems listed below. Physiatrist and rehab team continue to assess barriers to discharge/monitor patient progress toward functional and medical goals.  Function:  Bathing Bathing position   Position: Shower  Bathing parts Body parts bathed by patient: Left arm, Right arm, Chest, Abdomen, Front perineal area, Buttocks, Left upper leg, Right lower leg, Left lower leg, Right upper leg, Back Body parts bathed by helper: Back  Bathing assist Assist Level: Supervision or verbal cues      Upper Body Dressing/Undressing Upper body dressing   What is the patient wearing?: Pull over shirt/dress     Pull over shirt/dress - Perfomed by patient: Thread/unthread left sleeve, Thread/unthread right sleeve, Put head through opening, Pull shirt over trunk Pull over shirt/dress - Perfomed by helper: Pull shirt over trunk        Upper body assist Assist Level: Supervision or verbal cues      Lower Body Dressing/Undressing Lower body dressing   What is the patient wearing?: Underwear, Pants, Socks, Shoes Underwear - Performed by patient: Pull underwear up/down, Thread/unthread left underwear leg, Thread/unthread right underwear leg Underwear - Performed by helper: Pull underwear up/down Pants- Performed by patient: Thread/unthread right pants leg, Thread/unthread left pants leg, Pull pants up/down Pants- Performed by helper: Pull pants up/down Non-skid slipper socks- Performed by patient: Don/doff right sock, Don/doff left sock Non-skid slipper socks- Performed by helper: Don/doff left sock, Don/doff right sock Socks - Performed by patient: Don/doff left sock, Don/doff right sock   Shoes -  Performed by patient: Don/doff right shoe, Don/doff left shoe, Fasten right, Fasten left            Lower body assist Assist for lower body dressing: Supervision or verbal cues      Toileting Toileting   Toileting steps completed by patient: Adjust clothing prior to toileting,  Performs perineal hygiene, Adjust clothing after toileting Toileting steps completed by helper: Adjust clothing prior to toileting, Adjust clothing after toileting Toileting Assistive Devices: Grab bar or rail  Toileting assist Assist level: Supervision or verbal cues   Transfers Chair/bed transfer   Chair/bed transfer method: Stand pivot Chair/bed transfer assist level: Supervision or verbal cues Chair/bed transfer assistive device: Armrests     Locomotion Ambulation     Max distance: 147ft Assist level: Touching or steadying assistance (Pt > 75%)   Wheelchair   Type: Manual Max wheelchair distance: 134ft Assist Level: Supervision or verbal cues  Cognition Comprehension Comprehension assist level: Follows complex conversation/direction with extra time/assistive device  Expression Expression assist level: Expresses basic 90% of the time/requires cueing < 10% of the time.  Social Interaction Social Interaction assist level: Interacts appropriately with others - No medications needed.  Problem Solving Problem solving assist level: Solves complex problems: Recognizes & self-corrects  Memory Memory assist level: More than reasonable amount of time    Medical Problem List and Plan: 1. Left hemiparesis and speech/swallowing difficultiessecondary to embolic (?artery to artery)  right MCA infarct  Cont CIR  2. DVT Prophylaxis/Anticoagulation: Pharmaceutical: Lovenox,Brillinta 3. Pain Management: tylenol prn  4. Mood: LCSW to follow for evaluation and support. Team to provide ego support--showing signs of adjustment reaction. 5. Neuropsych: This patient appears to be capable of making decisions on hisown behalf. 6. Skin/Wound Care: routine pressure relief measures.  7. Fluids/Electrolytes/Nutrition: Monitor I/Os  BMP within acceptable range on 8/9  Good PO intake 8. Glaucoma: Continue Xalatan as at home. 9. ABLA:   Hb 12.5 on 8/9  Labs ordered for Thursday  Cont to  monitor 10. Elevated triglycerides: Now on Lipitor.  11. HTN: Overall controlled 8/14 Vitals:   12/16/16 1350 12/17/16 0500  BP: 127/69 125/87  Pulse: 62 78  Resp: 17 18  Temp: 98.4 F (36.9 C) 97.9 F (36.6 C)  SpO2: 99% 98%   12. Dysphagia: Resolved 13. Hypoalbuminemia  Supplement initiated 7/28 14.  Mild neutropenia  Afebrile   WBCs 3.3 on 8/9  Labs ordered for Thursday  LOS (Days) 18 A FACE TO FACE EVALUATION WAS PERFORMED  Deysha Cartier Karis Juba 12/17/2016 8:41 AM

## 2016-12-17 NOTE — Plan of Care (Signed)
Problem: RH Balance Goal: LTG: Patient will maintain dynamic sitting balance (OT) LTG:  Patient will maintain dynamic sitting balance with assistance during activities of daily living (OT)  Goal upgraded 8/14 ESD Goal: LTG Patient will maintain dynamic standing with ADLs (OT) LTG:  Patient will maintain dynamic standing balance with assist during activities of daily living (OT)   Goal upgraded 8/14 ESD  Problem: RH Grooming Goal: LTG Patient will perform grooming w/assist,cues/equip (OT) LTG: Patient will perform grooming with assist, with/without cues using equipment (OT)  Goal upgraded 8/14 ESD  Problem: RH Bathing Goal: LTG Patient will bathe with assist, cues/equipment (OT) LTG: Patient will bathe specified number of body parts with assist with/without cues using equipment (position)  (OT)  Goal upgraded 8/14 ESD  Problem: RH Dressing Goal: LTG Patient will perform upper body dressing (OT) LTG Patient will perform upper body dressing with assist, with/without cues (OT).  Goal upgraded 8/14 ESD Goal: LTG Patient will perform lower body dressing w/assist (OT) LTG: Patient will perform lower body dressing with assist, with/without cues in positioning using equipment (OT)  Goal upgraded 8/14 ESD  Problem: RH Toileting Goal: LTG Patient will perform toileting w/assist, cues/equip (OT) LTG: Patient will perform toiletiing (clothes management/hygiene) with assist, with/without cues using equipment (OT)  Goal upgraded 8/14 ESD  Problem: RH Toilet Transfers Goal: LTG Patient will perform toilet transfers w/assist (OT) LTG: Patient will perform toilet transfers with assist, with/without cues using equipment (OT)  Goal upgraded 8/14 ESD  Comments: Goal upgraded 8/14 ESD

## 2016-12-17 NOTE — Progress Notes (Signed)
Speech Language Pathology Daily Session Note  Patient Details  Name: Jason Nolan MRN: [id number] Date of Birth: 09/17/1945  Today's Date: 12/17/2016 SLP Concurrent Time: [id number] SLP Concurrent Time Calculation (min): 45 min   Short Term Goals: Week 3: SLP Short Term Goal 1 (Week 3): Pt will utilize overarticulation to achieve intelligibility at the sentence level with min assist verbal cues.   SLP Short Term Goal 2 (Week 3): Pt will communicate his immediate needs and wants via multimodal means with mod I   SLP Short Term Goal 3 (Week 3): Pt will recognize and correct verbal errors at the phrase level with min assist verbal cues.    Skilled Therapeutic Interventions: Skilled treatment session focused on speech communication goals. SLP facilitated session by providing Mod I to achieve ~ 90% intelligibility at the connected sentence level in barrier game with another pt. Pt engaged in simple conversation about unknown topics with ~95% intelligibility with another pt. Pt was returned to room, left upright in wheelchair with wife present. Continue per current plan of care.      Function:    Cognition Comprehension Comprehension assist level: Follows complex conversation/direction with no assist  Expression   Expression assist level: Expresses basic needs/ideas: With no assist  Social Interaction Social Interaction assist level: Interacts appropriately with others - No medications needed.  Problem Solving Problem solving assist level: Solves complex problems: Recognizes & self-corrects  Memory Memory assist level: More than reasonable amount of time    Pain Pain Assessment Pain Assessment: No/denies pain  Therapy/Group: Individual Therapy  Henley Boettner 12/17/2016, 11:11 AM

## 2016-12-18 ENCOUNTER — Inpatient Hospital Stay (HOSPITAL_COMMUNITY): Payer: Medicare Other | Admitting: Occupational Therapy

## 2016-12-18 ENCOUNTER — Inpatient Hospital Stay (HOSPITAL_COMMUNITY): Payer: Medicare Other

## 2016-12-18 ENCOUNTER — Inpatient Hospital Stay (HOSPITAL_COMMUNITY): Payer: Medicare Other | Admitting: Physical Therapy

## 2016-12-18 DIAGNOSIS — R4701 Aphasia: Secondary | ICD-10-CM

## 2016-12-18 NOTE — Progress Notes (Addendum)
Occupational Therapy Session Note  Patient Details  Name: Jason Nolan MRN: [id number] Date of Birth: 04-Feb-1946  Today's Date: 12/18/2016 OT Individual Time: 1501-1600 OT Individual Time Calculation (min): 59 min    Short Term Goals: Week 3:  OT Short Term Goal 1 (Week 3): LTG=STG 2/2 ELOS  Skilled Therapeutic Interventions/Progress Updates:    OT treatment session focused on L NMR, L UE there-ex, and NMES .Pt completed 10 mins on [employer] arm-bike on level 1.5 using only L UE. Ace wrap used to help maintain grasp on handle. NMR with weight bearing on raised table with towel pushes. 1:1 NMES applied to wrist extensors.   Ratio 1:1 Rate 35 pps Waveform- Asymmetric Ramp 1.0 Pulse 300 Intensity- 20 Duration -  15 mins   No adverse reactions after treatment and is skin intact.  Provided pt with squeeze foam for L hand there-ex and propelled pt back to room 2/2 time management.   Pt left seated in wc with handoff to SLP at end of session .  Therapy Documentation Precautions:  Precautions Precautions: Fall Restrictions Weight Bearing Restrictions: No Pain:  none/denies pain  See Function Navigator for Current Functional Status.   Therapy/Group: Individual Therapy  [name] 12/18/2016, 2:08 PM

## 2016-12-18 NOTE — Progress Notes (Signed)
Social Work Patient ID: Jason Nolan, male   DOB: 1945-11-03, 71 y.o.   MRN: [id number]   Jason Nolan, Jason Legacy, LCSW Social Worker Signed   Patient Care Conference Date of Service: 12/18/2016  1:43 PM      Hide copied text Hover for attribution information Inpatient RehabilitationTeam Conference and Plan of Care Update Date: 12/18/2016   Time: 11:15 AM      Patient Name: Jason Nolan      Medical Record Number: [id number]  Date of Birth: 10/03/1945 Sex: Male         Room/Bed: 4M09C/4M09C-01 Payor Info: Payor: MEDICARE / Plan: MEDICARE PART A AND B / Product Type: *No Product type* /     Admitting Diagnosis: R CVA  Admit Date/Time:  11/29/2016  4:16 PM Admission Comments: No comment available    Primary Diagnosis:  Gait disturbance, post-stroke Principal Problem: Gait disturbance, post-stroke       Patient Active Problem List    Diagnosis Date Noted  . Non-fluent aphasia    . Left hemiparesis ([place])    . Gait disturbance, post-stroke 12/12/2016  . 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 Hypoalbuminemia due to protein-calorie malnutrition ([place])    . Dysarthria, post-stroke    . Benign essential HTN    . Acute blood loss anemia    . Acute right MCA stroke ([place]) 11/29/2016  . Dysphagia, oropharyngeal 11/29/2016  . Essential hypertension 11/29/2016  . Cerebral embolism with cerebral infarction 11/25/2016  . 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 Leukopenia 03/31/2016  . Hepatitis C 07/22/2013      Expected Discharge Date: Expected Discharge Date: 12/21/16   Team Members Present: Physician leading conference: Dr. Alysia Nolan Social Worker Present: Jason Alpers, LCSW Nurse Present: Jason Sneddon, RN PT Present: Jason Nolan, PT OT Present: Jason Nolan, OT SLP Present: Jason Nolan, SLP PPS Coordinator present : Jason Nolan, PT    Current Status/Progress Goal Weekly Team Focus  Medical     The patient had some adjustment issues, but these have improved.  Blood pressure management  SS adjustment to disability   Bowel/Bladder     continent of bowel & bladder, LBM 12/17/16  remain continent  continue to monitor & assist as needed   Swallow/Nutrition/ Hydration     goals met         ADL's     Supervision overall  Supervision/Mod I  L NMR, pt.family education, dc planning, functional use L UE   Mobility     Min guard to supervision transfers, supervision gait w/ RW or single point cane, Min A gait w/o AD, supervision w/c,   supervision bed mobility, transfers, and gait with LRAD; min A stairs; mod-I w/c  dynamic standing balance, stepping strategy, gait training, LLE strengthening (esp DF), higher level ambulation   Communication     Min A to supervision at sentence level  Min A  use of speech intelligibility strategies   Safety/Cognition/ Behavioral Observations             Pain     no c/o pain, has tylenol prn  pain scale <2  continue to assess & treat as needed   Skin     no areas of skin break down   no new areas noted  assess q shift     Rehab Goals Patient on target to meet rehab  goals: Yes Rehab Goals Revised: none *See Care Plan and progress notes for long and short-term goals.      Barriers to Discharge   Current Status/Progress Possible Resolutions Date Resolved   Physician     Home environment access/layout;Medical stability     Progressing towards goals  Discharge planning, caregiver training      Nursing                 PT  Decreased caregiver support                 OT                 SLP            SW              Discharge Planning/Teaching Needs:  Pt plans to return to his home with his wife to provide supervision.  Wife is here with pt until d/c to receive family education.   Team Discussion:  Pt is doing well medically and emotionally.  Pt is continent of bowel and bladder.  OT  reported that pt's balance is great and his arm is getting stronger daily.  Pt is min guard to supervision with transfers and gait with rolling walker and cane.  Pt and PT trying to decide between cane and walker, as walker would be better for energy conservation.  Pt has mod I goals for OT; supervision for PT with min A for stairs.  Pt has met swallow goals and has supervision to mod I goals for conversation.    Revisions to Treatment Plan:  none    Continued Need for Acute Rehabilitation Level of Care: The patient requires daily medical management by a physician with specialized training in physical medicine and rehabilitation for the following conditions: Daily direction of a multidisciplinary physical rehabilitation program to ensure safe treatment while eliciting the highest outcome that is of practical value to the patient.: Yes Daily medical management of patient stability for increased activity during participation in an intensive rehabilitation regime.: Yes Daily analysis of laboratory values and/or radiology reports with any subsequent need for medication adjustment of medical intervention for : Neurological problems;Blood pressure problems   Jason Nolan, Jason Nolan 12/18/2016, 1:43 PM

## 2016-12-18 NOTE — Progress Notes (Signed)
Physical Therapy Session Note  Patient Details  Name: Jason CroakJames Marques MRN: [id number] Date of Birth: [birth year]  Today's Date: 12/18/2016 PT Individual Time: 1000-1100 PT Individual Time Calculation (min): 60 min   Short Term Goals: Week 3:  PT Short Term Goal 1 (Week 3): Equal LTGs  Skilled Therapeutic Interventions/Progress Updates:    pt c/o ankle soreness. PT treatment session focused on ambulation with LRAD, pt education about AFO options for improved L LE dorsiflexion during ambulation, and L UE NMR with NMES.  Pt sitting in w/c upon arrival with wife present, agreeable to PT. W/c mobility 14550ft using B LEs to therapy gym with supervision. Ambulated 18325ft, 33300ft x 2, and 16000ft, sitting breaks between, all with [facility] and close supervision for safety working on improved endurance and attention to L LE clearance during swing. PT provided verbal cues for increased L LE clearance. Tried foot up brace during gait without any improved L LE clearance during swing. Pt education on AFO options and toe cap. Seated L UE NMR using NMES application to wrist and finger extensors during UE task of grasping various objects (cup, bean bag, etc.) Seated L UE NMR of elbow flexion and pt education on using L UE during meals when possible and appropriate. Ambulated 11250ft using SPC to room with supervision for safety and verbal cues for attention to L LE clearance during swing phase. Pt left sitting in w/c with call bell in reach and wife present.    Therapy Documentation Precautions:  Precautions Precautions: Fall Restrictions Weight Bearing Restrictions: No   See Function Navigator for Current Functional Status.   Therapy/Group: Individual Therapy  Susa Bones 12/18/2016, 12:18 PM

## 2016-12-18 NOTE — Progress Notes (Signed)
Occupational Therapy Session Note  Patient Details  Name: Jason CroakJames Lofaro MRN: [id number] Date of Birth: Jan 15, 1946  Today's Date: 12/18/2016 OT Individual Time: 4132-44010800-0845 OT Individual Time Calculation (min): 45 min    Short Term Goals: Week 3:  OT Short Term Goal 1 (Week 3): LTG=STG 2/2 ELOS  Skilled Therapeutic Interventions/Progress Updates:    Upon entering the room, pt supine in bed with wife present in room. Pt with no c/o pain and requesting to shower this session. Pt ambulating with RW to dresser to obtain clothing item with supervision. Pt standing for toileting needs with overall supervision to management clothing and hygiene. Pt performed stand pivot transfer onto TTB with RW and supervision for bathing at shower level. Pt performed lateral leans to wash buttocks and required overall supervision with bathing task. Pt dressing from [name] with supervision as well. Pt required min A ambulating to sink for grooming task in standing. Pt standing for over 13 minutes to complete tasks. Pt seated with call bell and all needed items within reach.   Therapy Documentation Precautions:  Precautions Precautions: Fall Restrictions Weight Bearing Restrictions: No General:   Vital Signs: Therapy Vitals Temp: 97.6 F (36.4 C) Temp Source: Oral Pulse Rate: 66 Resp: 18 BP: 114/67 Patient Position (if appropriate): Sitting Oxygen Therapy SpO2: 94 % O2 Device: Not Delivered Pain: Pain Assessment Pain Assessment: No/denies pain  See Function Navigator for Current Functional Status.   Therapy/Group: Individual Therapy  Alen BleacherBradsher, Shawntell Dixson P 12/18/2016, 9:09 AM

## 2016-12-18 NOTE — Patient Care Conference (Signed)
Inpatient RehabilitationTeam Conference and Plan of Care Update Date: 12/18/2016   Time: 11:15 AM    Patient Name: Jason Nolan      Medical Record Number: [id number]  Date of Birth: [birth year] Sex: Male         Room/Bed: 4M09C/4M09C-01 Payor Info: Payor: MEDICARE / Plan: MEDICARE PART A AND B / Product Type: *No Product type* /    Admitting Diagnosis: R CVA  Admit Date/Time:  11/29/2016  4:16 PM Admission Comments: No comment available   Primary Diagnosis:  Gait disturbance, post-stroke Principal Problem: Gait disturbance, post-stroke  Patient Active Problem List   Diagnosis Date Noted  . Non-fluent aphasia   . Left hemiparesis ([place])   . Gait disturbance, post-stroke 12/12/2016  . 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 Hypoalbuminemia due to protein-calorie malnutrition ([place])   . Dysarthria, post-stroke   . Benign essential HTN   . Acute blood loss anemia   . Acute right MCA stroke ([place]) 11/29/2016  . Dysphagia, oropharyngeal 11/29/2016  . Essential hypertension 11/29/2016  . Cerebral embolism with cerebral infarction 11/25/2016  . Acute ischemic stroke ([place]) - R MCA embolic stroke in setting of ruptured R ICA plaque w dissection, s/p R MCA stent and thrombectomy 11/25/2016  . Leukopenia 03/31/2016  . Hepatitis C 07/22/2013    Expected Discharge Date: Expected Discharge Date: 12/21/16  Team Members Present: Physician leading conference: Dr. Alysia Penna Social Worker Present: Alfonse Alpers, LCSW Nurse Present: Arelia Sneddon, RN PT Present: Dwyane Dee, PT OT Present: Cherylynn Ridges, OT SLP Present: Charolett Bumpers, SLP PPS Coordinator present : Ileana Ladd, PT     Current Status/Progress Goal Weekly Team Focus  Medical   The patient had some adjustment issues, but these have improved.  Blood pressure management  SS adjustment to disability   Bowel/Bladder   continent of bowel & bladder, LBM 12/17/16  remain continent  continue to monitor &  assist as needed   Swallow/Nutrition/ Hydration   goals met         ADL's   Supervision overall  Supervision/Mod I  L NMR, pt.family education, dc planning, functional use L UE   Mobility   Min guard to supervision transfers, supervision gait w/ RW or single point cane, Min A gait w/o AD, supervision w/c,   supervision bed mobility, transfers, and gait with LRAD; min A stairs; mod-I w/c  dynamic standing balance, stepping strategy, gait training, LLE strengthening (esp DF), higher level ambulation   Communication   Min A to supervision at sentence level  Min A  use of speech intelligibility strategies   Safety/Cognition/ Behavioral Observations            Pain   no c/o pain, has tylenol prn  pain scale <2  continue to assess & treat as needed   Skin   no areas of skin break down   no new areas noted  assess q shift    Rehab Goals Patient on target to meet rehab goals: Yes Rehab Goals Revised: none *See Care Plan and progress notes for long and short-term goals.     Barriers to Discharge  Current Status/Progress Possible Resolutions Date Resolved   Physician    Home environment access/layout;Medical stability     Progressing towards goals  Discharge planning, caregiver training      Nursing                  PT  Decreased caregiver support  OT                  SLP                SW                Discharge Planning/Teaching Needs:  Pt plans to return to his home with his wife to provide supervision.  Wife is here with pt until d/c to receive family education.   Team Discussion:  Pt is doing well medically and emotionally.  Pt is continent of bowel and bladder.  OT reported that pt's balance is great and his arm is getting stronger daily.  Pt is min guard to supervision with transfers and gait with rolling walker and cane.  Pt and PT trying to decide between cane and walker, as walker would be better for energy conservation.  Pt has mod I goals for OT;  supervision for PT with min A for stairs.  Pt has met swallow goals and has supervision to mod I goals for conversation.    Revisions to Treatment Plan:  none    Continued Need for Acute Rehabilitation Level of Care: The patient requires daily medical management by a physician with specialized training in physical medicine and rehabilitation for the following conditions: Daily direction of a multidisciplinary physical rehabilitation program to ensure safe treatment while eliciting the highest outcome that is of practical value to the patient.: Yes Daily medical management of patient stability for increased activity during participation in an intensive rehabilitation regime.: Yes Daily analysis of laboratory values and/or radiology reports with any subsequent need for medication adjustment of medical intervention for : Neurological problems;Blood pressure problems  Cyanna Neace, Silvestre Mesi 12/18/2016, 1:43 PM

## 2016-12-18 NOTE — Progress Notes (Signed)
Speech Language Pathology Daily Session Note  Patient Details  Name: Jason Nolan MRN: [id number] Date of Birth: 02-02-1946  Today's Date: 12/18/2016 SLP Individual Time: 1403-1430 SLP Individual Time Calculation (min): 27 min  Short Term Goals: Week 3: SLP Short Term Goal 1 (Week 3): Pt will utilize overarticulation to achieve intelligibility at the sentence level with min assist verbal cues.   SLP Short Term Goal 2 (Week 3): Pt will communicate his immediate needs and wants via multimodal means with mod I   SLP Short Term Goal 3 (Week 3): Pt will recognize and correct verbal errors at the phrase level with min assist verbal cues.    Skilled Therapeutic Interventions: Skilled ST services focused on speech intelligibility goals. SLP facilitated speech intelligibility at conversational level with - 85% intelligibility with Mod I use of strategies to slow rate, check for understanding, repeat message if there is a communication breakdown, and sound out syllables. Pt required Min a verbal cues to monitor unclear messages and to self correct. SLP educated pt and family member about anticipatory communication issues once returning home. Pt was left in chair with family in room and call bell within reach. Recommend to continue [facility] services.      Function:  Eating Eating                 Cognition Comprehension Comprehension assist level: Follows complex conversation/direction with no assist  Expression   Expression assist level: Expresses basic needs/ideas: With no assist  Social Interaction Social Interaction assist level: Interacts appropriately with others - No medications needed.  Problem Solving Problem solving assist level: Solves complex problems: Recognizes & self-corrects  Memory Memory assist level: More than reasonable amount of time    Pain Pain Assessment Pain Assessment: No/denies pain  Therapy/Group: Individual Therapy  Jason Nolan  [facility] 12/18/2016, 4:58 PM

## 2016-12-18 NOTE — Progress Notes (Signed)
[place] PHYSICAL MEDICINE & REHABILITATION     PROGRESS NOTE  Subjective/Complaints:    Discussed Neuropsych eval with Dr Sima Matas  ROS: Denies CP, SOB, N/V/D.  Objective: Vital Signs: Blood pressure 114/67, pulse 66, temperature 97.6 F (36.4 C), temperature source Oral, resp. rate 18, height 5' 10"  (1.778 m), weight 86.4 kg (190 lb 8.4 oz), SpO2 94 %. No results found. No results for input(s): WBC, HGB, HCT, PLT in the last 72 hours. No results for input(s): NA, K, CL, GLUCOSE, BUN, CREATININE, CALCIUM in the last 72 hours.  Invalid input(s): CO CBG (last 3)  No results for input(s): GLUCAP in the last 72 hours.  Wt Readings from Last 3 Encounters:  12/18/16 86.4 kg (190 lb 8.4 oz)  11/25/16 96.9 kg (213 lb 10 oz)  11/25/16 90.7 kg (200 lb)    Physical Exam:  BP 114/67 (BP Location: Right Leg)   Pulse 66   Temp 97.6 F (36.4 C) (Oral)   Resp 18   Ht 5' 10"  (1.778 m)   Wt 86.4 kg (190 lb 8.4 oz)   SpO2 94%   BMI 27.34 kg/m  Constitutional: He appears well-developedand well-nourished. No distress.  HENT: Normocephalicand atraumatic.  Eyes: EOMI. No discharge. Cardiovascular: RRR. No murmurheard. Respiratory: Effort normal breath sounds normal. GI: Soft. Bowel sounds are normal.  Musculoskeletal: He exhibits No edema, no tenderness  Neurological: He is alertand oriented Dysarthria.  Fair insight and awareness.  Left facial weakness  LUE: shoulder, elbow 4/5, hand, wrist 1+/5 (stable) RUE/RLE: 4/5 proximal to distal LLE: 4/5 HF, KE, ADF/PF Skin: Skin is warmand dry. He is not diaphoretic. Right inguinal scarring.  Psychiatric: Normal mood and affect.   Assessment/Plan: 1. Functional deficits secondary to embolic right MCA infarct which require 3+ hours per day of interdisciplinary therapy in a comprehensive inpatient rehab setting. Physiatrist is providing close team supervision and 24 hour management of active medical problems listed  below. Physiatrist and rehab team continue to assess barriers to discharge/monitor patient progress toward functional and medical goals.  Function:  Bathing Bathing position   Position: Shower  Bathing parts Body parts bathed by patient: Left arm, Right arm, Chest, Abdomen, Front perineal area, Buttocks, Left upper leg, Right lower leg, Left lower leg, Right upper leg, Back Body parts bathed by helper: Back  Bathing assist Assist Level: Supervision or verbal cues      Upper Body Dressing/Undressing Upper body dressing   What is the patient wearing?: Pull over shirt/dress     Pull over shirt/dress - Perfomed by patient: Thread/unthread left sleeve, Thread/unthread right sleeve, Put head through opening, Pull shirt over trunk Pull over shirt/dress - Perfomed by helper: Pull shirt over trunk        Upper body assist Assist Level: Supervision or verbal cues      Lower Body Dressing/Undressing Lower body dressing   What is the patient wearing?: Underwear, Pants, Socks, Shoes Underwear - Performed by patient: Pull underwear up/down, Thread/unthread left underwear leg, Thread/unthread right underwear leg Underwear - Performed by helper: Pull underwear up/down Pants- Performed by patient: Thread/unthread right pants leg, Thread/unthread left pants leg, Pull pants up/down Pants- Performed by helper: Pull pants up/down Non-skid slipper socks- Performed by patient: Don/doff right sock, Don/doff left sock Non-skid slipper socks- Performed by helper: Don/doff left sock, Don/doff right sock Socks - Performed by patient: Don/doff left sock, Don/doff right sock   Shoes - Performed by patient: Don/doff right shoe, Don/doff left shoe, Fasten right, Fasten left  Lower body assist Assist for lower body dressing: Supervision or verbal cues      Toileting Toileting   Toileting steps completed by patient: Adjust clothing prior to toileting, Performs perineal hygiene, Adjust  clothing after toileting Toileting steps completed by helper: Adjust clothing prior to toileting, Adjust clothing after toileting Toileting Assistive Devices: Grab bar or rail  Toileting assist Assist level: Supervision or verbal cues   Transfers Chair/bed transfer   Chair/bed transfer method: Stand pivot Chair/bed transfer assist level: Supervision or verbal cues Chair/bed transfer assistive device: Armrests     Locomotion Ambulation     Max distance: 150' Assist level: Touching or steadying assistance (Pt > 75%)   Wheelchair   Type: Manual Max wheelchair distance: 121f Assist Level: Supervision or verbal cues  Cognition Comprehension Comprehension assist level: Follows complex conversation/direction with no assist  Expression Expression assist level: Expresses basic needs/ideas: With no assist  Social Interaction Social Interaction assist level: Interacts appropriately with others - No medications needed.  Problem Solving Problem solving assist level: Solves complex problems: Recognizes & self-corrects  Memory Memory assist level: More than reasonable amount of time    Medical Problem List and Plan: 1. Left hemiparesis and speech/swallowing difficultiessecondary to embolic (?artery to artery)  right MCA infarct  Cont CIR Team conference today please see physician documentation under team conference tab, met with team face-to-face to discuss problems,progress, and goals. Formulized individual treatment plan based on medical history, underlying problem and comorbidities.  2. DVT Prophylaxis/Anticoagulation: Pharmaceutical: Lovenox,Brillinta 3. Pain Management: tylenol prn  4. Mood: LCSW to follow for evaluation and support. Team to provide ego support--no major depression, no abnormal adjustment issues identified by Neuropsychology 5. Neuropsych: This patient appears to be capable of making decisions on hisown behalf. 6. Skin/Wound Care: routine pressure relief measures.   7. Fluids/Electrolytes/Nutrition: Monitor I/Os    Good PO intake 8. Glaucoma: Continue Xalatan as at home. 9. ABLA:   Hb 12.5 on 8/9  Labs ordered for Thursday  Cont to monitor 10. Elevated triglycerides: Now on Lipitor.  11. HTN: Overall controlled 8/15 Vitals:   12/17/16 1500 12/18/16 0600  BP: (!) 152/74 114/67  Pulse: 84 66  Resp: 18 18  Temp: 98.3 F (36.8 C) 97.6 F (36.4 C)  SpO2: 93% 94%   12. Dysphagia: Resolved 13. Hypoalbuminemia  Supplement initiated 7/28 14.  Mild neutropenia  Afebrile   WBCs 3.3 on 8/9  Labs ordered for Thursday  LOS (Days) [place]  KIRSTEINS,ANDREW E 12/18/2016 8:07 AM

## 2016-12-18 NOTE — Progress Notes (Signed)
Orthopedic Tech Progress Note Patient Details:  Jason Nolan May 24, 1945 [id number]  Patient ID: Jason Nolan, male   DOB: May 24, 1945, 71 y.o.   MRN: [id number]   Jason Nolan, Jason Nolan 12/18/2016, 1:15 PM Called in advanced brace order; [facility]

## 2016-12-19 ENCOUNTER — Inpatient Hospital Stay (HOSPITAL_COMMUNITY): Payer: Medicare Other | Admitting: Occupational Therapy

## 2016-12-19 ENCOUNTER — Inpatient Hospital Stay (HOSPITAL_COMMUNITY): Payer: Medicare Other | Admitting: Speech Pathology

## 2016-12-19 ENCOUNTER — Inpatient Hospital Stay (HOSPITAL_COMMUNITY): Payer: Medicare Other | Admitting: Physical Therapy

## 2016-12-19 LAB — CBC
HEMATOCRIT: 42.1 % (ref 39.0–52.0)
Hemoglobin: 13.5 g/dL (ref 13.0–17.0)
MCH: 30.4 pg (ref 26.0–34.0)
MCHC: 32.1 g/dL (ref 30.0–36.0)
MCV: 94.8 fL (ref 78.0–100.0)
PLATELETS: 297 10*3/uL (ref 150–400)
RBC: 4.44 MIL/uL (ref 4.22–5.81)
RDW: 13.8 % (ref 11.5–15.5)
WBC: 3.3 10*3/uL — ABNORMAL LOW (ref 4.0–10.5)

## 2016-12-19 LAB — BASIC METABOLIC PANEL
Anion gap: 7 (ref 5–15)
BUN: 13 mg/dL (ref 6–20)
CHLORIDE: 108 mmol/L (ref 101–111)
CO2: 26 mmol/L (ref 22–32)
CREATININE: 0.94 mg/dL (ref 0.61–1.24)
Calcium: 8.9 mg/dL (ref 8.9–10.3)
GFR calc Af Amer: >60 mL/min (ref >60)
GFR calc non Af Amer: >60 mL/min (ref >60)
GLUCOSE: 100 mg/dL — AB (ref 65–99)
POTASSIUM: 3.5 mmol/L (ref 3.5–5.1)
Sodium: 141 mmol/L (ref 135–145)

## 2016-12-19 NOTE — Progress Notes (Signed)
Physical Therapy Session Note  Patient Details  Name: Jason Nolan MRN: [id number] Date of Birth: Jul 20, 1945  Today's Date: 12/19/2016 PT Individual Time: 1300-1415 PT Individual Time Calculation (min): 75 min   Short Term Goals: Week 3:  PT Short Term Goal 1 (Week 3): Equal LTGs  Skilled Therapeutic Interventions/Progress Updates:    no c/o pain. PT treatment session focused on gait training, orthotics consult, stair negotiation, and L UE NMR.  Pt sitting in w/c upon arrival with wife and friends present, agreeable to PT session. PT pushed pt to/from gym in w/c for energy conservation and time management. Ambulated 27700ft with SPC and close supervision with occasional min guard when switching surfaces (tile to carpet). PT provided verbal cues to attend to L LE clearance during gait. Sit to stands throughout session with supervision. Pt education on energy conservation during ambulation with RW versus SPC. Performed standing kinetron without UE support 2 x to fatigue with seated break between and min guard for steadying during. Sitting L UE elbow flexion and extension x 10 with PT support for shoulder abduction. Sit <> supine with supervision for safety. Supine PNF D1 pattern with L UE for NMR with assist needed for extension and pt able to perform flexion against min resistance. Orthotics consult for AFO and toe cap. Ambulated 21000ft with SPC with supervision and occasional min guard with increased fatigue. Ambulated 24800ft with posterior leaf spring AFO with supervision and occasional min guard with no significant improvements in L LE foot clearance and PT not recommending AFO at this time. PT recommending toe cap on L shoe. Pt returned to room in w/c, left sitting in w/c with wife present and call bell in reach.   Therapy Documentation Precautions:  Precautions Precautions: Fall Restrictions Weight Bearing Restrictions: No  See Function Navigator for Current Functional  Status.   Therapy/Group: Individual Therapy  Jason Nolan 12/19/2016, 4:18 PM

## 2016-12-19 NOTE — Progress Notes (Signed)
Occupational Therapy Session Note  Patient Details  Name: Jason Nolan MRN: [id number] Date of Birth: February 22, 1946  Today's Date: 12/19/2016 OT Individual Time: 1062-6948 OT Individual Time Calculation (min): 61 min    Short Term Goals: Week 3:  OT Short Term Goal 1 (Week 3): LTG=STG 2/2 ELOS  Skilled Therapeutic Interventions/Progress Updates:    OT treatment session focused on pt/family education, dc planning, functional use of L UE within bathing/dressing tasks, and L UE NMR. Pt ambulated to dresser using [facility] with close supervision. Worked on functional grasp/release to open drawers using L UE-facilitation to achieve finger placement on handle, pt then able to push drawers closed after retrieving clothing. Shower level bathing completed without assistance. Pt completed dressing mod I, then OT applied NMES to wrist extensors while discussing home set-up and DME needs with pt and spouse. Propelled wc to therapy apartment 2/2 time management and pt completed simulated walk-in shower transfer using 3-in-1 BSC as shower seat with spouses. Pt returned to room at end of session and left seated in wc with spouse present and needs met. 2  Therapy Documentation Precautions:  Precautions Precautions: Fall Restrictions Weight Bearing Restrictions: No Pain:  none/denies pain  See Function Navigator for Current Functional Status.   Therapy/Group: Individual Therapy  Valma Cava 12/19/2016, 2:51 PM

## 2016-12-19 NOTE — Progress Notes (Signed)
Speech Language Pathology Daily Session Note  Patient Details  Name: Jason Nolan MRN: [id number] Date of Birth: 28-Jun-1945  Today's Date: 12/19/2016 SLP Individual Time: 0930-1015 SLP Individual Time Calculation (min): 45 min  Short Term Goals: Week 3: SLP Short Term Goal 1 (Week 3): Pt will utilize overarticulation to achieve intelligibility at the sentence level with min assist verbal cues.   SLP Short Term Goal 2 (Week 3): Pt will communicate his immediate needs and wants via multimodal means with mod I   SLP Short Term Goal 3 (Week 3): Pt will recognize and correct verbal errors at the phrase level with min assist verbal cues.    Skilled Therapeutic Interventions: Skilled treatment session focused on speech goals. SLP facilitated session by providing intermittent supervision verbal cues for patient to self-monitor and correct errors at the sentence level while focusing on phonemes /k/, /l/, and /g/ in the initial, medial and finial positions. Patient was ~90% intelligible. Patient left upright in wheelchair with all needs within reach. Continue with current plan of care.      Function:   Cognition Comprehension Comprehension assist level: Follows complex conversation/direction with no assist  Expression   Expression assist level: Expresses complex 90% of the time/cues < 10% of the time  Social Interaction Social Interaction assist level: Interacts appropriately with others - No medications needed.  Problem Solving Problem solving assist level: Solves complex problems: Recognizes & self-corrects  Memory Memory assist level: More than reasonable amount of time    Pain No/Denies Pain   Therapy/Group: Individual Therapy  Cleatis Fandrich 12/19/2016, 4:01 PM

## 2016-12-19 NOTE — Progress Notes (Signed)
Social Work Patient ID: Jason Nolan, male   DOB: 1945-05-11, 71 y.o.   MRN: [id number]   CSW met with pt and his wife 12-18-16 to update them on team conference discussion.  Pt feels prepared to go home 12-21-16 with home health for f/u therapies.  Wife will provide 24/7 supervision and they will be asking friends/family for assistance with transportation. DME to be ordered and HH will be arranged for PT/OT/ST.  CSW will meet with pt/wife once more to assure all arrangements have been made.

## 2016-12-19 NOTE — Progress Notes (Signed)
[facility] PHYSICAL MEDICINE & REHABILITATION     PROGRESS NOTE  Subjective/Complaints:    No issues overnite, asking questions about d/c ROS: Denies CP, SOB, N/V/D.  Objective: Vital Signs: Blood pressure 137/66, pulse 61, temperature 98.1 F (36.7 C), temperature source Oral, resp. rate 16, height 5\' 10"  (1.778 m), weight 86.4 kg (190 lb 8.4 oz), SpO2 100 %. No results found.  Recent Labs  12/19/16 0406  WBC 3.3*  HGB 13.5  HCT 42.1  PLT 297    Recent Labs  12/19/16 0406  NA 141  K 3.5  CL 108  GLUCOSE 100*  BUN 13  CREATININE 0.94  CALCIUM 8.9   CBG (last 3)  No results for input(s): GLUCAP in the last 72 hours.  Wt Readings from Last 3 Encounters:  12/18/16 86.4 kg (190 lb 8.4 oz)  11/25/16 96.9 kg (213 lb 10 oz)  11/25/16 90.7 kg (200 lb)    Physical Exam:  BP 137/66 (BP Location: Right Arm)   Pulse 61   Temp 98.1 F (36.7 C) (Oral)   Resp 16   Ht 5\' 10"  (1.778 m)   Wt 86.4 kg (190 lb 8.4 oz)   SpO2 100%   BMI 27.34 kg/m  Constitutional: He appears well-developedand well-nourished. No distress.  HENT: Normocephalicand atraumatic.  Eyes: EOMI. No discharge. Cardiovascular: RRR. No murmurheard. Respiratory: Effort normal breath sounds normal. GI: Soft. Bowel sounds are normal.  Musculoskeletal: He exhibits No edema, no tenderness  Neurological: He is alertand oriented Dysarthria.  Fair insight and awareness.  Left facial weakness  LUE: shoulder, elbow 4/5, hand, wrist 1+/5 (stable) RUE/RLE: 4/5 proximal to distal LLE: 4/5 HF, KE, ADF/PF Skin: Skin is warmand dry. He is not diaphoretic. Right inguinal scarring.  Psychiatric: Normal mood and affect.   Assessment/Plan: 1. Functional deficits secondary to embolic right MCA infarct which require 3+ hours per day of interdisciplinary therapy in a comprehensive inpatient rehab setting. Physiatrist is providing close team supervision and 24 hour management of active medical problems listed  below. Physiatrist and rehab team continue to assess barriers to discharge/monitor patient progress toward functional and medical goals.  Function:  Bathing Bathing position   Position: Shower  Bathing parts Body parts bathed by patient: Left arm, Right arm, Chest, Abdomen, Front perineal area, Buttocks, Left upper leg, Right lower leg, Left lower leg, Right upper leg, Back Body parts bathed by helper: Back  Bathing assist Assist Level: Supervision or verbal cues      Upper Body Dressing/Undressing Upper body dressing   What is the patient wearing?: Pull over shirt/dress     Pull over shirt/dress - Perfomed by patient: Thread/unthread left sleeve, Thread/unthread right sleeve, Put head through opening, Pull shirt over trunk Pull over shirt/dress - Perfomed by helper: Pull shirt over trunk        Upper body assist Assist Level: Supervision or verbal cues      Lower Body Dressing/Undressing Lower body dressing   What is the patient wearing?: Underwear, Pants, Socks, Shoes Underwear - Performed by patient: Pull underwear up/down, Thread/unthread left underwear leg, Thread/unthread right underwear leg Underwear - Performed by helper: Pull underwear up/down Pants- Performed by patient: Thread/unthread right pants leg, Thread/unthread left pants leg, Pull pants up/down Pants- Performed by helper: Pull pants up/down Non-skid slipper socks- Performed by patient: Don/doff right sock, Don/doff left sock Non-skid slipper socks- Performed by helper: Don/doff left sock, Don/doff right sock Socks - Performed by patient: Don/doff left sock, Don/doff right sock  Shoes - Performed by patient: Don/doff right shoe, Don/doff left shoe, Fasten right, Fasten left            Lower body assist Assist for lower body dressing: Supervision or verbal cues      Toileting Toileting   Toileting steps completed by patient: Adjust clothing prior to toileting, Performs perineal hygiene, Adjust  clothing after toileting Toileting steps completed by helper: Adjust clothing prior to toileting, Adjust clothing after toileting Toileting Assistive Devices: Grab bar or rail  Toileting assist Assist level: Supervision or verbal cues   Transfers Chair/bed transfer   Chair/bed transfer method: Ambulatory Chair/bed transfer assist level: Supervision or verbal cues Chair/bed transfer assistive device: Hospital doctorCane     Locomotion Ambulation     Max distance: 37700ft Assist level: Supervision or verbal cues   Wheelchair   Type: Manual Max wheelchair distance: 15550ft Assist Level: Supervision or verbal cues  Cognition Comprehension Comprehension assist level: Follows complex conversation/direction with no assist  Expression Expression assist level: Expresses basic needs/ideas: With no assist  Social Interaction Social Interaction assist level: Interacts appropriately with others - No medications needed.  Problem Solving Problem solving assist level: Solves complex problems: Recognizes & self-corrects  Memory Memory assist level: More than reasonable amount of time    Medical Problem List and Plan: 1. Left hemiparesis and speech/swallowing difficultiessecondary to embolic (?artery to artery)  right MCA infarct  Cont CIR 2. DVT Prophylaxis/Anticoagulation: Pharmaceutical: Lovenox,Brillinta plt normal 8/16 3. Pain Management: tylenol prn  4. Mood: LCSW to follow for evaluation and support. Team to provide ego support--no major depression, no abnormal adjustment issues identified by Neuropsychology 5. Neuropsych: This patient appears to be capable of making decisions on hisown behalf. 6. Skin/Wound Care: routine pressure relief measures.  7. Fluids/Electrolytes/Nutrition: Monitor I/Os    Good PO intake 8. Glaucoma: Continue Xalatan as at home. 9. ABLA: Resolved  Hb 13.5 on 8/16   Cont to monitor 10. Elevated triglycerides: Now on Lipitor.  11. HTN:  controlled 8/16 Vitals:    12/18/16 1536 12/19/16 0521  BP: 134/76 137/66  Pulse: 72 61  Resp: 16 16  Temp: 97.7 F (36.5 C) 98.1 F (36.7 C)  SpO2: 100% 100%   12. Dysphagia: Resolved 13. Hypoalbuminemia  Supplement initiated 7/28 14.  Mild neutropenia  Afebrile   WBCs 3.3 on 8/9 and 3.3 on 8/16    LOS (Days) 20 A FACE TO FACE EVALUATION WAS PERFORMED  Erick ColaceKIRSTEINS,ANDREW E 12/19/2016 7:31 AM

## 2016-12-19 NOTE — Progress Notes (Signed)
Speech Language Pathology Daily Session Note  Patient Details  Name: Terrilee CroakJames Gibbard MRN: [id number] Date of Birth: [birth year]  Today's Date: 12/19/2016 SLP Individual Time: 1130-1200 SLP Individual Time Calculation (min): 30 min  Short Term Goals: Week 3: SLP Short Term Goal 1 (Week 3): Pt will utilize overarticulation to achieve intelligibility at the sentence level with min assist verbal cues.   SLP Short Term Goal 2 (Week 3): Pt will communicate his immediate needs and wants via multimodal means with mod I   SLP Short Term Goal 3 (Week 3): Pt will recognize and correct verbal errors at the phrase level with min assist verbal cues.    Skilled Therapeutic Interventions:  Pt was seen for skilled ST targeting communication.  During functional conversations with therapist, pt was able to achieve intelligibility at the sentence/conversational level with mod I for slow rate and overarticulation.  Pt presented with questions regarding stroke recovery and SLP provided skilled education regarding sequela of stroke recovery and realistic prognostic expectations given progress made thus far.  All questions were answered to pt's satisfaction at this time.  Pt left sitting in wheelchair with wife present.  Continue per current plan of care.    Function:  Eating Eating                 Cognition Comprehension Comprehension assist level: Follows complex conversation/direction with no assist  Expression   Expression assist level: Expresses complex 90% of the time/cues < 10% of the time  Social Interaction Social Interaction assist level: Interacts appropriately with others - No medications needed.  Problem Solving Problem solving assist level: Solves complex problems: Recognizes & self-corrects  Memory Memory assist level: More than reasonable amount of time    Pain Pain Assessment Pain Assessment: No/denies pain  Therapy/Group: Individual Therapy  Samayah Novinger, Melanee SpryNicole L 12/19/2016, 8:55 PM

## 2016-12-19 NOTE — Progress Notes (Signed)
Recreational Therapy Discharge Summary Patient Details  Name: Jason CroakJames Trzcinski MRN: [id number] Date of Birth: 01/17/1946 Today's Date: 12/19/2016  Comments on progress toward goals: Pt has made excellent progress toward goals and is ready for discharge home on 8/18 with wife to provide/coordinate supervision .  TR sessions focused on dynamic balance, safety awareness, LUE use, discharge planning, activity analysis with potential adaptations.  Pt is extremely motivated to regain further independence.   Reasons for discharge: discharge from hospital  Patient/family agrees with progress made and goals achieved: Yes  Ebubechukwu Jedlicka 12/19/2016, 12:10 PM

## 2016-12-19 NOTE — Discharge Summary (Signed)
Physician Discharge Summary  Patient ID: Jason Nolan MRN: [id number] DOB/AGE: Sep 04, 1945 71 y.o.  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, s/p R MCA stent and thrombectomy   Hypoalbuminemia due to protein-calorie malnutrition (HCC)   Dysarthria, post-stroke   Acute blood loss anemia   Left hemiparesis (HCC)   Non-fluent aphasia   Discharged Condition: stable   Significant Diagnostic Studies: N/A   Labs:  Basic Metabolic Panel: BMP Latest Ref Rng & Units 12/19/2016 12/12/2016 12/05/2016  Glucose 65 - 99 mg/dL 132(G) 98 98  BUN 6 - 20 mg/dL 13 15 15   Creatinine 0.61 - 1.24 mg/dL 4.01 0.27 2.53  Sodium 135 - 145 mmol/L 141 141 138  Potassium 3.5 - 5.1 mmol/L 3.5 3.8 3.5  Chloride 101 - 111 mmol/L 108 107 106  CO2 22 - 32 mmol/L 26 24 26   Calcium 8.9 - 10.3 mg/dL 8.9 6.6(Y) 4.0(H)    CBC: CBC Latest Ref Rng & Units 12/19/2016 12/12/2016 12/05/2016  WBC 4.0 - 10.5 K/uL 3.3(L) 3.3(L) 4.9  Hemoglobin 13.0 - 17.0 g/dL 47.4 12.5(L) 12.5(L)  Hematocrit 39.0 - 52.0 % 42.1 38.8(L) 38.1(L)  Platelets 150 - 400 K/uL 297 318 233    CBG: No results for input(s): GLUCAP in the last 168 hours.  Brief HPI:   Jason Nolan a 71 y.o.left handed male with history of Hep C-treated with Harvoni, glaucoma who was admitted via [facility] on 7/23 with left sided weakness, facial droop and slurred speech due to early changes of R-MCA territory infarct. He underwent CTA brain showed short segment near occlusion of proximal R-ICA with associated intraluminal thrombus --likely due to dissection and emergent large vessel occlusion with right M2 occlusion and evolving right frontal lobe infarct. He underwent cerebral angio with complete revascularization of occluded superior division of R-MCA and near complete  revascularization of proximal R-ICA with stent assisted angioplasty.  Stroke felt to be embolic in setting of ruptured plaque R-ICA and he was placed on ASA and brillinta due to stent. Patient with resultant left sided weakness, expressive deficits with aphonia and dysphagia. CIR recommended due to deficits in mobility and ability to carry out ADL tasks   Hospital Course: Jason Nolan was admitted to rehab 11/29/2016 for inpatient therapies to consist of PT, ST and OT at least three hours five days a week. Past admission physiatrist, therapy team and rehab RN have worked together to provide customized collaborative inpatient rehab. Blood pressures have been monitored on bid basis and have been relatively controlled. He was maintained on lipitor for dyslipidemia. ABLA has resolved and renal status has been stable. His po intake has been good and he is continent of bowel and bladder. Mood has been stable and neuropsychology has followed up with him to discuss issues in regards with adjustment and adaptation. He has had improvement in strength as well as improvement in verbal output has improved with increase in speech clarity. He has made great progress during his rehab stay and is at modified independent to supervision level at discharge.   Rehab course: During patient's stay in rehab weekly team conferences were held to monitor patient's progress, set goals and discuss barriers to discharge. At admission, patient required max assist with basic self care tasks and max assist with mobility. 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 He has had improvement in activity tolerance, balance, postural control, as well as ability to compensate for deficits. He is has had improvement in functional use LUE  and LLE as well as improved awareness. He is able to complete ADL tasks at modified independent to supervision level. He is modified independent for  transfers and is able to ambulate 150' with RW and supervision.  He is able express himself and speech is 90% intelligible with supervision needed to self monitor and correct errors. Family education was completed with wife regarding all aspects of safety and mobility.     Disposition: Home  Diet: heart healthy.   Special Instructions: 1. No driving till cleared by MD.   Discharge Instructions    Ambulatory referral to Physical Medicine Rehab    Complete by:  As directed    1-2 weeks transitional care appt     Allergies as of 12/21/2016   No Known Allergies     Medication List    TAKE these medications   aspirin 81 MG chewable tablet Chew 1 tablet (81 mg total) by mouth daily.   atorvastatin 40 MG tablet Commonly known as:  LIPITOR Take 1 tablet (40 mg total) by mouth daily at 6 PM.   latanoprost 0.005 % ophthalmic solution Commonly known as:  XALATAN Place 1 drop into both eyes at bedtime.   PAZEO 0.7 % Soln Generic drug:  Olopatadine HCl Place 1 drop into both eyes daily.   ticagrelor 90 MG Tabs tablet Commonly known as:  BRILINTA Take 1 tablet (90 mg total) by mouth 2 (two) times daily.   VYZULTA 0.024 % Soln Generic drug:  Latanoprostene Bunod Apply 1 drop to eye 2 (two) times daily.      Follow-up Information    Kirsteins, Victorino SparrowAndrew E, MD Follow up.   Specialty:  Physical Medicine and Rehabilitation Why:  office will call you with follow up appointment Contact information: [address] [place] [address] [phone]        Micki RileySethi, Pramod S, MD. Call.   Specialties:  Neurology, Radiology Why:  for follow up appointment in 4 weeks Contact information: [address] [place] [address] [phone]        Lorre MunroeBaity, Regina W, NP. Go on 12/31/2016.   Specialty:  Internal Medicine Why:  @ 3:45 PM Contact information: [address] [place] [address] [phone]           Signed: Jacquelynn CreeLove, Hopie Pellegrin  S 12/22/2016, 10:00 AM

## 2016-12-20 ENCOUNTER — Inpatient Hospital Stay (HOSPITAL_COMMUNITY): Payer: Medicare Other | Admitting: Physical Therapy

## 2016-12-20 ENCOUNTER — Inpatient Hospital Stay (HOSPITAL_COMMUNITY): Payer: Medicare Other | Admitting: Speech Pathology

## 2016-12-20 ENCOUNTER — Inpatient Hospital Stay (HOSPITAL_COMMUNITY): Payer: Medicare Other | Admitting: Occupational Therapy

## 2016-12-20 MED ORDER — ASPIRIN 81 MG PO CHEW: 81.0000 mg | CHEWABLE_TABLET | Freq: Every day | ORAL | Status: AC

## 2016-12-20 MED ORDER — TICAGRELOR 90 MG PO TABS: 90.0000 mg | ORAL_TABLET | Freq: Two times a day (BID) | ORAL | 0 refills | Status: DC

## 2016-12-20 MED ORDER — ATORVASTATIN CALCIUM 40 MG PO TABS: 40.0000 mg | ORAL_TABLET | Freq: Every day | ORAL | 0 refills | Status: DC

## 2016-12-20 MED ORDER — LATANOPROST 0.005 % OP SOLN: 1.0000 [drp] | Freq: Every day | OPHTHALMIC | 12 refills | Status: DC

## 2016-12-20 NOTE — Progress Notes (Signed)
[facility] PHYSICAL MEDICINE & REHABILITATION     PROGRESS NOTE  Subjective/Complaints:    No issues overnite, pt tolerating rehab , excited about d/c.  Discussed no driving  ROS: Denies CP, SOB, N/V/D.  Objective: Vital Signs: Blood pressure 130/68, pulse 66, temperature 97.8 F (36.6 C), temperature source Oral, resp. rate 14, height 5\' 10"  (1.778 m), weight 86.4 kg (190 lb 8.4 oz), SpO2 98 %. No results found.  Recent Labs  12/19/16 0406  WBC 3.3*  HGB 13.5  HCT 42.1  PLT 297    Recent Labs  12/19/16 0406  NA 141  K 3.5  CL 108  GLUCOSE 100*  BUN 13  CREATININE 0.94  CALCIUM 8.9   CBG (last 3)  No results for input(s): GLUCAP in the last 72 hours.  Wt Readings from Last 3 Encounters:  12/18/16 86.4 kg (190 lb 8.4 oz)  11/25/16 96.9 kg (213 lb 10 oz)  11/25/16 90.7 kg (200 lb)    Physical Exam:  BP 130/68 (BP Location: Right Arm)   Pulse 66   Temp 97.8 F (36.6 C) (Oral)   Resp 14   Ht 5\' 10"  (1.778 m)   Wt 86.4 kg (190 lb 8.4 oz)   SpO2 98%   BMI 27.34 kg/m  Constitutional: He appears well-developedand well-nourished. No distress.  HENT: Normocephalicand atraumatic.  Eyes: EOMI. No discharge. Cardiovascular: RRR. No murmurheard. Respiratory: Effort normal breath sounds normal. GI: Soft. Bowel sounds are normal.  Musculoskeletal: He exhibits No edema, no tenderness  Neurological: He is alertand oriented Dysarthria.  Fair insight and awareness.  Left facial weakness  LUE: shoulder, elbow 4/5, hand, wrist 1+/5 (stable) RUE/RLE: 4/5 proximal to distal LLE: 4/5 HF, KE, ADF/PF Skin: Skin is warmand dry. He is not diaphoretic. Right inguinal scarring.  Psychiatric: Normal mood and affect.   Assessment/Plan: 1. Functional deficits secondary to embolic right MCA infarct which require 3+ hours per day of interdisciplinary therapy in a comprehensive inpatient rehab setting. Physiatrist is providing close team supervision and 24 hour management  of active medical problems listed below. Physiatrist and rehab team continue to assess barriers to discharge/monitor patient progress toward functional and medical goals.  Function:  Bathing Bathing position   Position: Shower  Bathing parts Body parts bathed by patient: Left arm, Right arm, Chest, Abdomen, Front perineal area, Buttocks, Left upper leg, Right lower leg, Left lower leg, Right upper leg, Back Body parts bathed by helper: Back  Bathing assist Assist Level: Supervision or verbal cues      Upper Body Dressing/Undressing Upper body dressing   What is the patient wearing?: Pull over shirt/dress     Pull over shirt/dress - Perfomed by patient: Thread/unthread left sleeve, Thread/unthread right sleeve, Put head through opening, Pull shirt over trunk Pull over shirt/dress - Perfomed by helper: Pull shirt over trunk        Upper body assist Assist Level: Supervision or verbal cues      Lower Body Dressing/Undressing Lower body dressing   What is the patient wearing?: Underwear, Pants, Socks, Shoes Underwear - Performed by patient: Pull underwear up/down, Thread/unthread left underwear leg, Thread/unthread right underwear leg Underwear - Performed by helper: Pull underwear up/down Pants- Performed by patient: Thread/unthread right pants leg, Thread/unthread left pants leg, Pull pants up/down Pants- Performed by helper: Pull pants up/down Non-skid slipper socks- Performed by patient: Don/doff right sock, Don/doff left sock Non-skid slipper socks- Performed by helper: Don/doff left sock, Don/doff right sock Socks - Performed by  patient: Don/doff left sock, Don/doff right sock   Shoes - Performed by patient: Don/doff right shoe, Don/doff left shoe, Fasten right, Fasten left            Lower body assist Assist for lower body dressing: Supervision or verbal cues      Toileting Toileting   Toileting steps completed by patient: Adjust clothing prior to toileting,  Performs perineal hygiene, Adjust clothing after toileting Toileting steps completed by helper: Adjust clothing prior to toileting, Adjust clothing after toileting Toileting Assistive Devices: Grab bar or rail  Toileting assist Assist level: Supervision or verbal cues   Transfers Chair/bed transfer   Chair/bed transfer method: Ambulatory Chair/bed transfer assist level: Supervision or verbal cues Chair/bed transfer assistive device: Patent attorney     Max distance: 213ft Assist level: Supervision or verbal cues   Wheelchair   Type: Manual Max wheelchair distance: 111ft Assist Level: Supervision or verbal cues  Cognition Comprehension Comprehension assist level: Follows complex conversation/direction with no assist  Expression Expression assist level: Expresses complex 90% of the time/cues < 10% of the time  Social Interaction Social Interaction assist level: Interacts appropriately with others - No medications needed.  Problem Solving Problem solving assist level: Solves complex problems: Recognizes & self-corrects  Memory Memory assist level: More than reasonable amount of time    Medical Problem List and Plan: 1. Left hemiparesis and speech/swallowing difficultiessecondary to embolic (?artery to artery)  right MCA infarct  Cont CIR 2. DVT Prophylaxis/Anticoagulation: Pharmaceutical: Lovenox,Brillinta plt normal 8/16 3. Pain Management: tylenol prn  4. Mood: LCSW to follow for evaluation and support. Team to provide ego support--no major depression, no abnormal adjustment issues identified by Neuropsychology 5. Neuropsych: This patient appears to be capable of making decisions on hisown behalf. 6. Skin/Wound Care: routine pressure relief measures.  7. Fluids/Electrolytes/Nutrition: Monitor I/Os    Good PO intake 8. Glaucoma: Continue Xalatan as at home. 9. ABLA: Resolved  Hb 13.5 on 8/16   Cont to monitor 10. Elevated triglycerides: Now on  Lipitor.  11. HTN:  controlled 8/16 Vitals:   12/19/16 1630 12/20/16 0415  BP: 121/74 130/68  Pulse: 75 66  Resp: 17 14  Temp: 98.4 F (36.9 C) 97.8 F (36.6 C)  SpO2: 99% 98%   12. Dysphagia: Resolved 13. Hypoalbuminemia  Supplement initiated 7/28 14.  Mild neutropenia  Afebrile   WBCs 3.3 on 8/9 and 3.3 on 8/16    LOS (Days) 21 A FACE TO FACE EVALUATION WAS PERFORMED  Erick Colace 12/20/2016 7:37 AM

## 2016-12-20 NOTE — Discharge Instructions (Signed)
Inpatient Rehab Discharge Instructions  Autry Zarek Discharge date and time:  12/21/16  Activities/Precautions/ Functional Status: Activity: no lifting, driving, or strenuous exercise for till cleared by MD Diet: cardiac diet Wound Care: none needed   Functional status:  ___ No restrictions     ___ Walk up steps independently _X__ 24/7 supervision/assistance   ___ Walk up steps with assistance ___ Intermittent supervision/assistance  ___ Bathe/dress independently ___ Walk with walker     _X__ Bathe/dress with assistance ___ Walk Independently    ___ Shower independently ___ Walk with assistance    ___ Shower with assistance _X__ No alcohol     ___ Return to work/school ________  COMMUNITY REFERRALS UPON DISCHARGE:   Home Health:   PT     OT     ST  Agency:  [facility] Phone:  [phone] Medical Equipment/Items Ordered:  Rolling walker and bedside commode  Agency/Supplier:  [facility]        Phone:  [phone]  GENERAL COMMUNITY RESOURCES FOR PATIENT/FAMILY: Support Groups:  [facility] Stroke Support Group                              Meets the 2nd Thursday of each month from 3-4PM (except June, July, and August)                              In the dayroom of Inpatient Rehab Center at [facility] [phone])                              For more information, please call [phone]  Special Instructions:   STROKE/TIA DISCHARGE INSTRUCTIONS SMOKING Cigarette smoking nearly doubles your risk of having a stroke & is the single most alterable risk factor  If you smoke or have smoked in the last 12 months, you are advised to quit smoking for your health.  Most of the excess cardiovascular risk related to smoking disappears within a year of stopping.  Ask you doctor about anti-smoking medications  [facility] Quit Line: 1-800-QUIT NOW  Free Smoking Cessation Classes [phone]  CHOLESTEROL Know your levels; limit fat & cholesterol in your diet    Lipid Panel     Component Value Date/Time   CHOL 193 11/26/2016 0420   TRIG 197 (H) 11/26/2016 0420   HDL 57 11/26/2016 0420   CHOLHDL 3.4 11/26/2016 0420   VLDL 39 11/26/2016 0420   LDLCALC 97 11/26/2016 0420      Many patients benefit from treatment even if their cholesterol is at goal.  Goal: Total Cholesterol (CHOL) less than 160  Goal:  Triglycerides (TRIG) less than 150  Goal:  HDL greater than 40  Goal:  LDL (LDLCALC) less than 100   BLOOD PRESSURE American Stroke Association blood pressure target is less that 120/80 mm/Hg  Your discharge blood pressure is:  BP: (!) 155/75  Monitor your blood pressure  Limit your salt and alcohol intake  Many individuals will require more than one medication for high blood pressure  DIABETES (A1c is a blood sugar average for last 3 months) Goal HGBA1c is under 7% (HBGA1c is blood sugar average for last 3 months)  Diabetes: No known diagnosis of diabetes    Lab Results  Component Value Date   HGBA1C 5.4 11/26/2016  Your HGBA1c can be lowered with medications, healthy diet, and exercise.  Check your blood sugar as directed by your physician  Call your physician if you experience unexplained or low blood sugars.  PHYSICAL ACTIVITY/REHABILITATION Goal is 30 minutes at least 4 days per week  Activity: No driving, Therapies: see above Return to work: N/A  Activity decreases your risk of heart attack and stroke and makes your heart stronger.  It helps control your weight and blood pressure; helps you relax and can improve your mood.  Participate in a regular exercise program.  Talk with your doctor about the best form of exercise for you (dancing, walking, swimming, cycling).  DIET/WEIGHT Goal is to maintain a healthy weight  Your discharge diet is: DIET DYS 3 Room service appropriate? Yes; Fluid consistency: Thin liquids Your height is:  Height: 5\' 10"  (177.8 cm) Your current weight is: Weight: 87.3 kg (192 lb 6.4  oz) Your Body Mass Index (BMI) is:  BMI (Calculated): 29.3  Following the type of diet specifically designed for you will help prevent another stroke.  Your goal weight is:  174 lbs  Your goal Body Mass Index (BMI) is 19-24.  Healthy food habits can help reduce 3 risk factors for stroke:  High cholesterol, hypertension, and excess weight.  RESOURCES Stroke/Support Group:  Call [phone]   STROKE EDUCATION PROVIDED/REVIEWED AND GIVEN TO PATIENT Stroke warning signs and symptoms How to activate emergency medical system (call 911). Medications prescribed at discharge. Need for follow-up after discharge. Personal risk factors for stroke. Pneumonia vaccine given:  Flu vaccine given:  My questions have been answered, the writing is legible, and I understand these instructions.  I will adhere to these goals & educational materials that have been provided to me after my discharge from the hospital.     My questions have been answered and I understand these instructions. I will adhere to these goals and the provided educational materials after my discharge from the hospital.  Patient/Caregiver Signature _______________________________ Date __________  Clinician Signature _______________________________________ Date __________  Please bring this form and your medication list with you to all your follow-up doctor's appointments.

## 2016-12-20 NOTE — Progress Notes (Signed)
Speech Language Pathology Session Note & Discharge Summary  Patient Details  Name: Jason Nolan MRN: [id number] Date of Birth: February 22, 1946  Today's Date: 12/20/2016 SLP Individual Time: 0915-1000 SLP Individual Time Calculation (min): 45 min   Skilled Therapeutic Interventions:   Skilled treatment session focused on speech goals. SLP facilitated session by providing Mod I for 90% intelligibility at the word level and 80% intelligibility at the sentence and conversation level during an informal speech task. Patient education complete and patient will discharge home tomorrow. Patient left upright in wheelchair with all needs within reach.   Patient has met 3 of 3 long term goals.  Patient to discharge at overall Supervision level.   Reasons goals not met: N/A   Clinical Impression/Discharge Summary: Patient had made excellent gain and has met 3 of 3 LTG's this admission. Currently, patient requires overall supervision verbal cues to achieve ~90% intelligibility at the conversation level and to self-monitor and correct errors. Patient education is complete and patient will discharge home. Patient would benefit from f/u SLP services to maximize his functional communication and overall functional independence.   Care Partner:  Caregiver Able to Provide Assistance: Yes     Recommendation:  Outpatient SLP  Rationale for SLP Follow Up: Maximize functional communication   Equipment: N/A   Reasons for discharge: Discharged from hospital;Treatment goals met   Patient/Family Agrees with Progress Made and Goals Achieved: Yes   Function:  Eating Eating     Eating Assist Level: No help, No cues           Cognition Comprehension Comprehension assist level: Follows complex conversation/direction with no assist  Expression   Expression assist level: Expresses complex 90% of the time/cues < 10% of the time  Social Interaction Social Interaction assist level: Interacts appropriately with  others - No medications needed.  Problem Solving Problem solving assist level: Solves complex problems: Recognizes & self-corrects  Memory Memory assist level: More than reasonable amount of time   Elleana Stillson 12/20/2016, 3:50 PM

## 2016-12-20 NOTE — Progress Notes (Signed)
Physical Therapy Session Note  Patient Details  Name: Jason Nolan MRN: [id number] Date of Birth: 12/04/1945  Today's Date: 12/20/2016 PT Individual Time: 1100-1200 PT Individual Time Calculation (min): 60 min   Short Term Goals: Week 3:  PT Short Term Goal 1 (Week 3): Equal LTGs  Skilled Therapeutic Interventions/Progress Updates:    Pt c/o left ankle "soreness." PT treatment session focused on standing balance on compliant surfaces with LE exercises, dynamic gait, and L UE NMR.   Pt sitting in recliner upon arrival with wife present, agreeable to PT session. Pt and wife educated on removing rugs and other clutter on the floor that could increase fall risk in the home. PT wheeled pt to/from gym for energy conservation and time management. Standing on rocker board performing ankle plantarflexion and dorsiflexoin with R UE support and min guard for steadying working on ankle strategy and strengthening. Standing on BOSU ball in parallel bars with R UE support and min guard assist while performing ankle plantarflexion and dorsiflexion progressed to mini-squats. PT provided verbal and tactile cues for correct posture, equal weight bearing, and correct form during exercises. Tandem walking 22ft x 2, seated break between, with min assist for balance and pt performing stepping strategy to maintain balance as needed. Standing on airex in semi-tandem with min guard progressed to adding horizontal and vertical head turns with min assist for balance. Pt returned to room and left in w/c with call bell in reach and wife present.     Therapy Documentation Precautions:  Precautions Precautions: Fall Restrictions Weight Bearing Restrictions: No   See Function Navigator for Current Functional Status.   Therapy/Group: Individual Therapy  Jumar Greenstreet 12/20/2016, 12:25 PM

## 2016-12-20 NOTE — Progress Notes (Signed)
Social Work Discharge Note  The overall goal for the admission was met for:   Discharge location: Yes - home with wife  Length of Stay: Yes - 20 days  Discharge activity level: Yes - supervision/mod I (min A for stairs)  Home/community participation: Yes  Services provided included: MD, RD, PT, OT, SLP, RN, Pharmacy, Neuropsych and SW  Financial Services: Medicare and Private Insurance: [employer]  Follow-up services arranged: Home Health: PT/OT/ST from [facility], DME: rolling walker and bedside commode from [facility] and Patient/Family has no preference for HH/DME agencies  Comments (or additional information): Pt to return to his home with his wife to provide supervision.  She has received family education and she and pt feel prepared to go home.  Pt has seen progress and is pleased.  Patient/Family verbalized understanding of follow-up arrangements: Yes  Individual responsible for coordination of the follow-up plan: pt with support from his wife  Confirmed correct DME delivered: Trey Sailors 12/20/2016    Prevatt, Silvestre Mesi

## 2016-12-20 NOTE — Progress Notes (Signed)
Occupational Therapy Discharge Summary  Patient Details  Name: Jason Nolan MRN: [id number] Date of Birth: [birth year]  Today's Date: 12/20/2016 OT Individual Time: 3716-9678 OT Individual Time Calculation (min): 60 min   Pt  Completed BADLs with overall Mod I/supervision level. Discussed with pt/spouse,  home safety awareness and home modifications for safe  ADL participation. Pt's spouse feels comfortable with shower transfers and pt feel prepared for dc. OT provided treatment to address functional use of L UE with 1:1 NMES with functional activity of grasping cups. Facilitation for normal movement patterns to decrease exaggerated shoulder movement while NMES applied. Pt returned to room at end of session and left seated in wc with spouse present and needs met.   Patient has met 11 of 11 long term goals due to improved activity tolerance, improved balance, postural control, ability to compensate for deficits, functional use of  LEFT upper extremity, improved attention, improved awareness and improved coordination.  Patient to discharge at overall supervision/ Modified Independent level.  Patient's care partner is independent to provide the necessary physical assistance at discharge.    Reasons goals not met: n/a  Recommendation:  Patient will benefit from ongoing skilled OT services in home health setting to continue to advance functional skills in the area of BADL.  Equipment: 3-in-1 BSC, and RW  Reasons for discharge: treatment goals met and discharge from hospital  Patient/family agrees with progress made and goals achieved: Yes  OT Discharge Precautions/Restrictions  Precautions Precautions: Fall Precaution Comments: LUE hemiparesis, L inattention Restrictions Weight Bearing Restrictions: No Pain Pain Assessment Pain Assessment: No/denies pain ADL ADL Equipment Provided: Other (comment) (shoe buttons) Eating: Modified independent Grooming: Modified independent Upper Body  Bathing: Modified independent Lower Body Bathing: Modified independent Upper Body Dressing: Modified independent (Device) Lower Body Dressing: Modified independent Toileting: Modified independent Toilet Transfer: Modified independent Science writer: Energy manager: Distant supervision Social research officer, government Method: Heritage manager: Radio broadcast assistant ADL Comments: Please see functional navigator Vision Baseline Vision/History: Wears glasses Wears Glasses: Reading only Patient Visual Report: No change from baseline Perception  Perception: Impaired Inattention/Neglect: Does not attend to left side of body Praxis Praxis: Intact Cognition Overall Cognitive Status: Within Functional Limits for tasks assessed Arousal/Alertness: Awake/alert Orientation Level: Oriented X4 Attention: Selective Sustained Attention: Appears intact Comments: dysarthria continues but improving Sensation Sensation Light Touch: Appears Intact Additional Comments: improved since evaluation in LEs Coordination Gross Motor Movements are Fluid and Coordinated: Yes Fine Motor Movements are Fluid and Coordinated: No Finger Nose Finger Test: unable to complete on LUE Motor  Motor Motor: Hemiplegia Motor - Discharge Observations: L UE hemiplegia Mobility  Bed Mobility Supine to Sit: 6: Modified independent (Device/Increase time) Supine to Sit Details: Verbal cues for technique;Verbal cues for precautions/safety;Verbal cues for safe use of DME/AE;Verbal cues for sequencing;Manual facilitation for placement Sit to Supine: 6: Modified independent (Device/Increase time) Sit to Supine - Details: Verbal cues for precautions/safety;Verbal cues for technique;Verbal cues for sequencing;Verbal cues for safe use of DME/AE;Manual facilitation for placement Transfers Sit to Stand: 6: Modified independent (Device/Increase time)  Trunk/Postural Assessment  Cervical  Assessment Cervical Assessment: Within Functional Limits Thoracic Assessment Thoracic Assessment: Within Functional Limits Lumbar Assessment Lumbar Assessment: Within Functional Limits Postural Control Postural Control: Deficits on evaluation Protective Responses: delayed   Balance Balance Balance Assessed: Yes Static Sitting Balance Static Sitting - Level of Assistance: 7: Independent Dynamic Sitting Balance Dynamic Sitting - Balance Support: During functional activity Dynamic Sitting - Level of Assistance: 6:  Modified independent (Device/Increase time) Static Standing Balance Static Standing - Balance Support: During functional activity Static Standing - Level of Assistance: 6: Modified independent (Device/Increase time) Dynamic Standing Balance Dynamic Standing - Balance Support: During functional activity Dynamic Standing - Level of Assistance: 6: Modified independent (Device/Increase time) Extremity/Trunk Assessment RUE Assessment RUE Assessment: Within Functional Limits LUE Assessment LUE Assessment: Not tested (shoulder 3-/5, 3-/5 elbow, 3/5 wrist, able to grasp )  See Function Navigator for Current Functional Status.  Daneen Schick Audrielle Vankuren 12/20/2016, 3:31 PM

## 2016-12-20 NOTE — Progress Notes (Signed)
Physical Therapy Discharge Summary  Patient Details  Name: Jason Nolan MRN: [id number] Date of Birth: 17-Oct-1945  Today's Date: 12/20/2016 PT Individual Time: 0488-8916 PT Individual Time Calculation (min): 30 min    Patient has met 11 of 11 long term goals due to improved activity tolerance, improved balance, improved postural control, increased strength, ability to compensate for deficits, functional use of  left lower extremity, improved attention, improved awareness and improved coordination.  Patient to discharge at an ambulatory level Supervision.   Patient's care partner is independent to provide the necessary physical assistance at discharge.   Recommendation:  Patient will benefit from ongoing skilled PT services in home health setting to continue to advance safe functional mobility, address ongoing impairments in balance, coordination, safety awareness, coordination, and motor control, and minimize fall risk.  Equipment: RW  Reasons for discharge: treatment goals met and discharge from hospital  Patient/family agrees with progress made and goals achieved: Yes   Skilled PT intervention: No c/o pain.  Session focus on family education and d/c planning.  Pt's wife completed hands on family training for car transfers, ambulation, and curb step negotiation to simulate home entry.  Discussed f/u with home health and stroke recovery.  Pt returned to room at end of session and positioned with call bell in reach and needs met.   PT Discharge Precautions/Restrictions Precautions Precautions: Fall Precaution Comments: LUE hemiparesis, L inattention Restrictions Weight Bearing Restrictions: No Pain Pain Assessment Pain Assessment: No/denies pain Vision/Perception  Vision - History Baseline Vision: Wears glasses only for reading Patient Visual Report: No change from baseline Perception Perception: Impaired Inattention/Neglect: Does not attend to left side of  body Praxis Praxis: Intact  Cognition Overall Cognitive Status: Within Functional Limits for tasks assessed Arousal/Alertness: Awake/alert Orientation Level: Oriented X4 Attention: Selective Sustained Attention: Appears intact Comments: dysarthria continues but improving Sensation Sensation Light Touch: Appears Intact Additional Comments: improved since evaluation in LEs Coordination Gross Motor Movements are Fluid and Coordinated: Yes Fine Motor Movements are Fluid and Coordinated: No Finger Nose Finger Test: unable to complete on LUE Motor  Motor Motor: Hemiplegia Motor - Discharge Observations: L UE hemiplegia  Mobility Bed Mobility Supine to Sit: 6: Modified independent (Device/Increase time) Supine to Sit Details: Verbal cues for technique;Verbal cues for precautions/safety;Verbal cues for safe use of DME/AE;Verbal cues for sequencing;Manual facilitation for placement Sit to Supine: 6: Modified independent (Device/Increase time) Sit to Supine - Details: Verbal cues for precautions/safety;Verbal cues for technique;Verbal cues for sequencing;Verbal cues for safe use of DME/AE;Manual facilitation for placement Transfers Transfers: Yes Sit to Stand: 6: Modified independent (Device/Increase time) Stand Pivot Transfers: 6: Modified independent (Device/Increase time) Squat Pivot Transfers: 6: Modified independent (Device/Increase time) Locomotion  Ambulation Ambulation: Yes Ambulation/Gait Assistance: 5: Supervision Ambulation Distance (Feet): 150 Feet Assistive device: Rolling walker Gait Gait: Yes Gait Pattern: Step-through pattern;Poor foot clearance - left  Trunk/Postural Assessment  Cervical Assessment Cervical Assessment: Within Functional Limits Thoracic Assessment Thoracic Assessment: Within Functional Limits Lumbar Assessment Lumbar Assessment: Within Functional Limits Postural Control Postural Control: Deficits on evaluation Protective Responses: delayed    Balance Balance Balance Assessed: Yes Static Sitting Balance Static Sitting - Level of Assistance: 7: Independent Dynamic Sitting Balance Dynamic Sitting - Balance Support: During functional activity Dynamic Sitting - Level of Assistance: 6: Modified independent (Device/Increase time) Static Standing Balance Static Standing - Balance Support: During functional activity Static Standing - Level of Assistance: 6: Modified independent (Device/Increase time) Dynamic Standing Balance Dynamic Standing - Balance Support: During functional activity Dynamic  Standing - Level of Assistance: 6: Modified independent (Device/Increase time) Extremity Assessment  RUE Assessment RUE Assessment: Within Functional Limits LUE Assessment LUE Assessment: Not tested (shoulder 3-/5, 3-/5 elbow, 3/5 wrist, able to grasp ) RLE Assessment RLE Assessment: Within Functional Limits LLE AROM (degrees) LLE Overall AROM Comments: WNL LLE Strength Left Hip Flexion: 4+/5 Left Knee Flexion: 4+/5 Left Knee Extension: 5/5 Left Ankle Dorsiflexion: 4+/5 Left Ankle Plantar Flexion: 4+/5   See Function Navigator for Current Functional Status.  Keyundra Fant E Penven-Crew 12/20/2016, 3:40 PM

## 2016-12-21 NOTE — Progress Notes (Signed)
Patient discharged with wife to home at 1100. Patient discharged with all belongings and discharge instructions given yesterday. All questions answered and vitals stable. Discharge equipment including BSC and walker went with patient.

## 2016-12-21 NOTE — Progress Notes (Signed)
[facility] PHYSICAL MEDICINE & REHABILITATION     PROGRESS NOTE  Subjective/Complaints:    Feeling well. Ready to go home!.  Objective: Vital Signs: Blood pressure 124/63, pulse 61, temperature 98.1 F (36.7 C), temperature source Oral, resp. rate 16, height 5\' 10"  (1.778 m), weight 86.4 kg (190 lb 8.4 oz), SpO2 100 %. No results found.  Recent Labs  12/19/16 0406  WBC 3.3*  HGB 13.5  HCT 42.1  PLT 297    Recent Labs  12/19/16 0406  NA 141  K 3.5  CL 108  GLUCOSE 100*  BUN 13  CREATININE 0.94  CALCIUM 8.9   CBG (last 3)  No results for input(s): GLUCAP in the last 72 hours.  Wt Readings from Last 3 Encounters:  12/18/16 86.4 kg (190 lb 8.4 oz)  11/25/16 96.9 kg (213 lb 10 oz)  11/25/16 90.7 kg (200 lb)    Physical Exam:  BP 124/63 (BP Location: Right Arm)   Pulse 61   Temp 98.1 F (36.7 C) (Oral)   Resp 16   Ht 5\' 10"  (1.778 m)   Wt 86.4 kg (190 lb 8.4 oz)   SpO2 100%   BMI 27.34 kg/m  Constitutional: He appears well-developedand well-nourished. No distress.  HENT: Normocephalicand atraumatic.  Eyes: EOMI. No discharge. Cardiovascular: RRR without murmur. No JVD  Respiratory: CTA Bilaterally without wheezes or rales. Normal effort l. GI: Soft. Bowel sounds are normal.  Musculoskeletal: He exhibits No edema, no tenderness  Neurological: He is alertand oriented Dysarthria.  Fair insight and awareness.  Left facial weakness  LUE: shoulder, elbow 4/5, hand, wrist 1+/5 (stable) RUE/RLE: 4/5 proximal to distal LLE: 4/5 HF, KE, ADF/PF Skin: Skin is warmand dry. He is not diaphoretic. Right inguinal scarring.  Psychiatric: Normal mood and affect.   Assessment/Plan: 1. Functional deficits secondary to embolic right MCA infarct which require 3+ hours per day of interdisciplinary therapy in a comprehensive inpatient rehab setting. Physiatrist is providing close team supervision and 24 hour management of active medical problems listed  below. Physiatrist and rehab team continue to assess barriers to discharge/monitor patient progress toward functional and medical goals.  Function:  Bathing Bathing position   Position: Shower  Bathing parts Body parts bathed by patient: Right arm, Left arm, Chest, Abdomen, Buttocks, Front perineal area, Right upper leg, Right lower leg, Left lower leg, Left upper leg Body parts bathed by helper: Back  Bathing assist Assist Level: More than reasonable time      Upper Body Dressing/Undressing Upper body dressing   What is the patient wearing?: Pull over shirt/dress     Pull over shirt/dress - Perfomed by patient: Thread/unthread right sleeve, Thread/unthread left sleeve, Put head through opening, Pull shirt over trunk Pull over shirt/dress - Perfomed by helper: Pull shirt over trunk        Upper body assist Assist Level: No help, No cues      Lower Body Dressing/Undressing Lower body dressing   What is the patient wearing?: Pants, Underwear, Socks, Shoes Underwear - Performed by patient: Thread/unthread right underwear leg, Thread/unthread left underwear leg, Pull underwear up/down Underwear - Performed by helper: Pull underwear up/down Pants- Performed by patient: Thread/unthread left pants leg, Thread/unthread right pants leg, Pull pants up/down, Fasten/unfasten pants Pants- Performed by helper: Pull pants up/down Non-skid slipper socks- Performed by patient: Don/doff right sock, Don/doff left sock Non-skid slipper socks- Performed by helper: Don/doff left sock, Don/doff right sock Socks - Performed by patient: Don/doff left sock, Don/doff right  sock   Shoes - Performed by patient: Don/doff right shoe, Don/doff left shoe, Fasten right, Fasten left            Lower body assist Assist for lower body dressing: Assistive device Assistive Device Comment: shoe buttons    Toileting Toileting   Toileting steps completed by patient: Adjust clothing prior to toileting, Performs  perineal hygiene, Adjust clothing after toileting Toileting steps completed by helper: Adjust clothing prior to toileting, Adjust clothing after toileting Toileting Assistive Devices: Grab bar or rail  Toileting assist Assist level: More than reasonable time   Transfers Chair/bed transfer   Chair/bed transfer method: Stand pivot Chair/bed transfer assist level: Supervision or verbal cues Chair/bed transfer assistive device: Armrests     Locomotion Ambulation     Max distance: 150 Assist level: Supervision or verbal cues   Wheelchair   Type: Manual Max wheelchair distance: 166ft Assist Level: Supervision or verbal cues  Cognition Comprehension Comprehension assist level: Follows complex conversation/direction with no assist  Expression Expression assist level: Expresses complex 90% of the time/cues < 10% of the time  Social Interaction Social Interaction assist level: Interacts appropriately with others - No medications needed.  Problem Solving Problem solving assist level: Solves complex problems: Recognizes & self-corrects  Memory Memory assist level: More than reasonable amount of time    Medical Problem List and Plan: 1. Left hemiparesis and speech/swallowing difficultiessecondary to embolic (?artery to artery)  right MCA infarct  -home today  -Patient to see Rehab MD in the office for transitional care encounter in 1-2 weeks.  2. DVT Prophylaxis/Anticoagulation: Pharmaceutical: Lovenox,Brillinta plt normal 8/16 3. Pain Management: tylenol prn  4. Mood: LCSW to follow for evaluation and support. Team to provide ego support--no major depression, no abnormal adjustment issues identified by Neuropsychology 5. Neuropsych: This patient appears to be capable of making decisions on hisown behalf. 6. Skin/Wound Care: routine pressure relief measures.  7. Fluids/Electrolytes/Nutrition: Monitor I/Os    Good PO intake 8. Glaucoma: Continue Xalatan as at home. 9. ABLA:  Resolved  Hb 13.5 on 8/16   Cont to monitor 10. Elevated triglycerides: Now on Lipitor.  11. HTN:  Fair control, outpt follow up Vitals:   12/20/16 1500 12/21/16 0451  BP: (!) 142/93 124/63  Pulse: 71 61  Resp: 16 16  Temp: 97.7 F (36.5 C) 98.1 F (36.7 C)  SpO2: 100% 100%   12. Dysphagia: Resolved 13. Hypoalbuminemia  Supplement initiated 7/28 14.  Mild neutropenia  Afebrile   WBCs 3.3 on 8/9 and 3.3 on 8/16    LOS (Days) 22 A FACE TO FACE EVALUATION WAS PERFORMED  SWARTZ,ZACHARY T 12/21/2016 8:45 AM

## 2016-12-23 ENCOUNTER — Telehealth: Payer: Self-pay

## 2016-12-23 ENCOUNTER — Telehealth (HOSPITAL_COMMUNITY): Payer: Self-pay

## 2016-12-23 DIAGNOSIS — B1920 Unspecified viral hepatitis C without hepatic coma: Secondary | ICD-10-CM | POA: Diagnosis not present

## 2016-12-23 DIAGNOSIS — I69354 Hemiplegia and hemiparesis following cerebral infarction affecting left non-dominant side: Secondary | ICD-10-CM | POA: Diagnosis not present

## 2016-12-23 DIAGNOSIS — I69322 Dysarthria following cerebral infarction: Secondary | ICD-10-CM | POA: Diagnosis not present

## 2016-12-23 DIAGNOSIS — H409 Unspecified glaucoma: Secondary | ICD-10-CM | POA: Diagnosis not present

## 2016-12-23 DIAGNOSIS — I10 Essential (primary) hypertension: Secondary | ICD-10-CM | POA: Diagnosis not present

## 2016-12-23 DIAGNOSIS — E46 Unspecified protein-calorie malnutrition: Secondary | ICD-10-CM | POA: Diagnosis not present

## 2016-12-23 DIAGNOSIS — I69320 Aphasia following cerebral infarction: Secondary | ICD-10-CM | POA: Diagnosis not present

## 2016-12-23 NOTE — Telephone Encounter (Signed)
Called to schedule stroke f/u, no answer, no vm. AW

## 2016-12-23 NOTE — Telephone Encounter (Signed)
Transitional Care call  Patient name: Jason Nolan) DOB: (Nov 02, 1945) 1. Are you/is patient experiencing any problems since coming home? (NO) a. Are there any questions regarding any aspect of care? (NO) 2. Are there any questions regarding medications administration/dosing? (NO) a. Are meds being taken as prescribed? (YES) b. "Patient should review meds with caller to confirm"  3. Have there been any falls? (NO) 4. Has Home Health been to the house and/or have they contacted you? (YES) a. If not, have you tried to contact them? (NA) b. Can we help you contact them? (NO) 5. Are bowels and bladder emptying properly? (YES) a. Are there any unexpected incontinence issues? (NO) b. If applicable, is patient following bowel/bladder programs? (NA) 6. Any fevers, problems with breathing, unexpected pain? (NO) 7. Are there any skin problems or new areas of breakdown? (NO) 8. Has the patient/family member arranged specialty MD follow up (ie cardiology/neurology/renal/surgical/etc.)?  (YES) a. Can we help arrange? (NO) 9. Does the patient need any other services or support that we can help arrange? (NO) 10. Are caregivers following through as expected in assisting the patient? (YES) 11. Has the patient quit smoking, drinking alcohol, or using drugs as recommended? (NA)  Appointment date/time (12/31/2016 / 300PM), arrive time (245pm) and who it is with here DR. KIRSTEINS) [address] [employer] [phone]

## 2016-12-24 ENCOUNTER — Telehealth: Payer: Self-pay

## 2016-12-24 DIAGNOSIS — I10 Essential (primary) hypertension: Secondary | ICD-10-CM | POA: Diagnosis not present

## 2016-12-24 DIAGNOSIS — B1920 Unspecified viral hepatitis C without hepatic coma: Secondary | ICD-10-CM | POA: Diagnosis not present

## 2016-12-24 DIAGNOSIS — I69354 Hemiplegia and hemiparesis following cerebral infarction affecting left non-dominant side: Secondary | ICD-10-CM | POA: Diagnosis not present

## 2016-12-24 DIAGNOSIS — I69320 Aphasia following cerebral infarction: Secondary | ICD-10-CM | POA: Diagnosis not present

## 2016-12-24 DIAGNOSIS — E46 Unspecified protein-calorie malnutrition: Secondary | ICD-10-CM | POA: Diagnosis not present

## 2016-12-24 DIAGNOSIS — I69322 Dysarthria following cerebral infarction: Secondary | ICD-10-CM | POA: Diagnosis not present

## 2016-12-24 NOTE — Telephone Encounter (Signed)
Spoke with patients wife, Jason Nolan. Patient busy with PT at time of call.   Transition Care Management Follow-up Telephone Call    Date discharged? 12/21/16   How have you been since you were released from the hospital? "He's doing great!"   Do you understand why you were in the hospital? yes   Do you understand the discharge instructions? yes   Where were you discharged to? Home. PT/OT to make home visits.    Items Reviewed:  Medications reviewed: no  Allergies reviewed: yes  Dietary changes reviewed: yes  Referrals reviewed: yes   Functional Questionnaire:   Activities of Daily Living (ADLs):   He states they are independent in the following: Currently being assisted by family, PT, ST, and OT. States they require assistance with the following:    Any transportation issues/concerns?: no   Any patient concerns? no   Confirmed importance and date/time of follow-up visits scheduled yes  Provider Appointment booked with PCP 12/31/16 @ 3:45.   Confirmed with patient if condition begins to worsen call PCP or go to the ER.  Patient was given the office number and encouraged to call back with question or concerns.  : yes

## 2016-12-25 DIAGNOSIS — E46 Unspecified protein-calorie malnutrition: Secondary | ICD-10-CM | POA: Diagnosis not present

## 2016-12-25 DIAGNOSIS — I69354 Hemiplegia and hemiparesis following cerebral infarction affecting left non-dominant side: Secondary | ICD-10-CM | POA: Diagnosis not present

## 2016-12-25 DIAGNOSIS — I69322 Dysarthria following cerebral infarction: Secondary | ICD-10-CM | POA: Diagnosis not present

## 2016-12-25 DIAGNOSIS — I10 Essential (primary) hypertension: Secondary | ICD-10-CM | POA: Diagnosis not present

## 2016-12-25 DIAGNOSIS — I69320 Aphasia following cerebral infarction: Secondary | ICD-10-CM | POA: Diagnosis not present

## 2016-12-25 DIAGNOSIS — B1920 Unspecified viral hepatitis C without hepatic coma: Secondary | ICD-10-CM | POA: Diagnosis not present

## 2016-12-27 ENCOUNTER — Telehealth: Payer: Self-pay

## 2016-12-27 DIAGNOSIS — I69354 Hemiplegia and hemiparesis following cerebral infarction affecting left non-dominant side: Secondary | ICD-10-CM | POA: Diagnosis not present

## 2016-12-27 DIAGNOSIS — I69320 Aphasia following cerebral infarction: Secondary | ICD-10-CM | POA: Diagnosis not present

## 2016-12-27 DIAGNOSIS — I10 Essential (primary) hypertension: Secondary | ICD-10-CM | POA: Diagnosis not present

## 2016-12-27 DIAGNOSIS — E46 Unspecified protein-calorie malnutrition: Secondary | ICD-10-CM | POA: Diagnosis not present

## 2016-12-27 DIAGNOSIS — I69322 Dysarthria following cerebral infarction: Secondary | ICD-10-CM | POA: Diagnosis not present

## 2016-12-27 DIAGNOSIS — B1920 Unspecified viral hepatitis C without hepatic coma: Secondary | ICD-10-CM | POA: Diagnosis not present

## 2016-12-27 NOTE — Telephone Encounter (Signed)
Bennetta Laos OT with [facility] left v/m requesting verbal orders HH OT 2 x a week for 1 week; 3 x a week for 1 week and 2 x a week for 2 weeks.

## 2016-12-27 NOTE — Telephone Encounter (Signed)
Ok for [facility] OT as stated

## 2016-12-30 DIAGNOSIS — I69354 Hemiplegia and hemiparesis following cerebral infarction affecting left non-dominant side: Secondary | ICD-10-CM | POA: Diagnosis not present

## 2016-12-30 DIAGNOSIS — I10 Essential (primary) hypertension: Secondary | ICD-10-CM | POA: Diagnosis not present

## 2016-12-30 DIAGNOSIS — I69322 Dysarthria following cerebral infarction: Secondary | ICD-10-CM | POA: Diagnosis not present

## 2016-12-30 DIAGNOSIS — B1920 Unspecified viral hepatitis C without hepatic coma: Secondary | ICD-10-CM | POA: Diagnosis not present

## 2016-12-30 DIAGNOSIS — E46 Unspecified protein-calorie malnutrition: Secondary | ICD-10-CM | POA: Diagnosis not present

## 2016-12-30 DIAGNOSIS — I69320 Aphasia following cerebral infarction: Secondary | ICD-10-CM | POA: Diagnosis not present

## 2016-12-30 NOTE — Telephone Encounter (Signed)
Verbal orders given  

## 2016-12-31 ENCOUNTER — Encounter: Payer: Medicare Other | Attending: Physical Medicine & Rehabilitation

## 2016-12-31 ENCOUNTER — Encounter: Payer: Self-pay | Admitting: Internal Medicine

## 2016-12-31 ENCOUNTER — Ambulatory Visit (HOSPITAL_BASED_OUTPATIENT_CLINIC_OR_DEPARTMENT_OTHER): Payer: Medicare Other | Admitting: Physical Medicine & Rehabilitation

## 2016-12-31 ENCOUNTER — Ambulatory Visit (INDEPENDENT_AMBULATORY_CARE_PROVIDER_SITE_OTHER): Payer: Medicare Other | Admitting: Internal Medicine

## 2016-12-31 ENCOUNTER — Encounter: Payer: Self-pay | Admitting: Physical Medicine & Rehabilitation

## 2016-12-31 ENCOUNTER — Encounter: Payer: Medicare Other | Admitting: Physical Medicine & Rehabilitation

## 2016-12-31 VITALS — BP 152/90 | HR 61 | Temp 97.8°F | Wt 199.5 lb

## 2016-12-31 VITALS — BP 179/87 | HR 55 | Resp 14

## 2016-12-31 DIAGNOSIS — G8194 Hemiplegia, unspecified affecting left nondominant side: Secondary | ICD-10-CM | POA: Diagnosis not present

## 2016-12-31 DIAGNOSIS — I10 Essential (primary) hypertension: Secondary | ICD-10-CM | POA: Diagnosis not present

## 2016-12-31 DIAGNOSIS — M7989 Other specified soft tissue disorders: Secondary | ICD-10-CM | POA: Diagnosis not present

## 2016-12-31 DIAGNOSIS — R519 Headache, unspecified: Secondary | ICD-10-CM

## 2016-12-31 DIAGNOSIS — R209 Unspecified disturbances of skin sensation: Secondary | ICD-10-CM

## 2016-12-31 DIAGNOSIS — Z7982 Long term (current) use of aspirin: Secondary | ICD-10-CM | POA: Insufficient documentation

## 2016-12-31 DIAGNOSIS — Z79899 Other long term (current) drug therapy: Secondary | ICD-10-CM | POA: Insufficient documentation

## 2016-12-31 DIAGNOSIS — I69328 Other speech and language deficits following cerebral infarction: Secondary | ICD-10-CM | POA: Diagnosis not present

## 2016-12-31 DIAGNOSIS — I63411 Cerebral infarction due to embolism of right middle cerebral artery: Secondary | ICD-10-CM | POA: Insufficient documentation

## 2016-12-31 DIAGNOSIS — R2689 Other abnormalities of gait and mobility: Secondary | ICD-10-CM | POA: Diagnosis not present

## 2016-12-31 DIAGNOSIS — R51 Headache: Secondary | ICD-10-CM | POA: Insufficient documentation

## 2016-12-31 DIAGNOSIS — Z87891 Personal history of nicotine dependence: Secondary | ICD-10-CM | POA: Insufficient documentation

## 2016-12-31 DIAGNOSIS — I69398 Other sequelae of cerebral infarction: Secondary | ICD-10-CM | POA: Insufficient documentation

## 2016-12-31 MED ORDER — AMLODIPINE BESYLATE 5 MG PO TABS: 5.0000 mg | ORAL_TABLET | Freq: Every day | ORAL | 0 refills | Status: DC

## 2016-12-31 NOTE — Patient Instructions (Signed)

## 2016-12-31 NOTE — Progress Notes (Signed)
Subjective:    Patient ID: Jason Nolan, male    DOB: 1945/10/28, 71 y.o.   MRN: [id number] 71 y.o. left handed male with history of Hep C-treated with Harvoni, glaucoma who was admitted via [facility] on 7/23 with left sided weakness, facial droop and slurred speech due to early changes of R-MCA territory infarct.  He underwent CTA brain showed short segment near occlusion of proximal R-ICA with associated intraluminal thrombus --likely due to dissection and emergent large vessel occlusion with right M2 occlusion and evolving right frontal lobe infarct.  He underwent cerebral angio with  complete revascularization of occluded superior division of R-MCA and near complete revascularization of proximal R-ICA with stent assisted angioplasty.  Stroke felt to be embolic in setting of ruptured plaque R-ICA and he was placed on ASA and brillinta due to stent.  Patient with resultant left sided weakness, expressive deficits with aphonia and dysphagia Admit date: 11/29/2016 Discharge date: 12/21/2016  HPI Headaches, everyday for short time,vertex of head k no falls or trauma, no OTC, discussed rec not to take NSAIDs except low dose ASA ,     left hand swollen- no trauma  Eating more fruit Pain Inventory Average Pain 0 Pain Right Now 0 My pain is no pain  In the last 24 hours, has pain interfered with the following? General activity 0 Relation with others 0 Enjoyment of life 0 What TIME of day is your pain at its worst? no pain Sleep (in general) Good  Pain is worse with: no pain Pain improves with: no pain Relief from Meds: no pain  Mobility walk without assistance walk with assistance use a cane how many minutes can you walk? 5 ability to climb steps?  yes do you drive?  yes Do you have any goals in this area?  yes  Function retired I need assistance with the following:  meal prep  Neuro/Psych No problems in this area trouble walking  Prior Studies hospital follow up  Physicians  involved in your care hospital follow up   Family History  Problem Relation Age of Onset  . Hypertension Mother   . Hypertension Father    Social History   Social History  . Marital status: Married    Spouse name: N/A  . Number of children: N/A  . Years of education: N/A   Social History Main Topics  . Smoking status: Former Games developer  . Smokeless tobacco: Former Neurosurgeon    Quit date: 05/06/1993  . Alcohol use None  . Drug use: No  . Sexual activity: Yes    Partners: Female    Birth control/ protection: None   Other Topics Concern  . None   Social History Narrative  . None   Past Surgical History:  Procedure Laterality Date  . IR ANGIO INTRA EXTRACRAN SEL COM CAROTID INNOMINATE UNI L MOD SED  11/25/2016  . IR ANGIO VERTEBRAL SEL SUBCLAVIAN INNOMINATE UNI R MOD SED  11/25/2016  . IR ANGIO VERTEBRAL SEL VERTEBRAL UNI L MOD SED  11/25/2016  . IR INTRAVSC STENT CERV CAROTID W/O EMB-PROT MOD SED INC ANGIO  11/25/2016  . IR PERCUTANEOUS ART THROMBECTOMY/INFUSION INTRACRANIAL INC DIAG ANGIO  11/25/2016  . RADIOLOGY WITH ANESTHESIA N/A 11/25/2016   Procedure: RADIOLOGY WITH ANESTHESIA;  Surgeon: Radiologist, Medication, MD;  Location: [facility] OR;  Service: Radiology;  Laterality: N/A;   Past Medical History:  Diagnosis Date  . Glaucoma   . Hepatitis C    BP (!) 179/87 (BP Location: Right Arm, Patient  Position: Sitting, Cuff Size: Normal)   Pulse (!) 55   Resp 14   SpO2 98%   Opioid Risk Score:   Fall Risk Score:  `1  Depression screen PHQ 2/9  Depression screen [facility] 2/9 01/26/2016 10/27/2014 07/22/2013  Decreased Interest 0 0 0  Down, Depressed, Hopeless 0 0 0  PHQ - 2 Score 0 0 0    Review of Systems  Constitutional: Negative.   HENT: Negative.   Eyes: Negative.   Respiratory: Negative.   Cardiovascular: Negative.   Gastrointestinal: Negative.   Endocrine: Negative.   Genitourinary: Negative.   Musculoskeletal: Negative.   Skin: Negative.   Allergic/Immunologic: Negative.    Neurological: Positive for headaches.  Hematological: Negative.   Psychiatric/Behavioral: Negative.        Objective:   Physical Exam  Constitutional: He is oriented to person, place, and time. He appears well-developed and well-nourished.  HENT:  Head: Normocephalic and atraumatic.  Eyes: Pupils are equal, round, and reactive to light. Conjunctivae and EOM are normal.  Cardiovascular: Normal rate, regular rhythm and normal heart sounds.   No murmur heard. Pulmonary/Chest: Effort normal and breath sounds normal. No respiratory distress. He has no wheezes.  Abdominal: Soft. Bowel sounds are normal. He exhibits no distension. There is no tenderness.  Musculoskeletal:  Left dorsum of hand edema No tenderness with MCP compression No pain with shoulder ROM   Neurological: He is alert and oriented to person, place, and time. A sensory deficit is present. He exhibits normal muscle tone. Coordination and gait abnormal.  Speech with moderate dysarthria Left central 7 Motor 3/5 Left delt, bi,tri, grip, Finger ext 4- Left HF, KE, 3- ankle DF     Psychiatric: He has a normal mood and affect. His behavior is normal. Judgment and thought content normal.  Nursing note and vitals reviewed. No evidence of visual field cut, no left neglect with visual or tactile confrontation Amb with [place],  Leather toe cap    Assessment & Plan:  1.  Right MCA distribution infarct with left hemiparesis, dysarthria and left hemisensory deficits,  Cont. Home health PT, OT,SLP Anticipate progression to Outpt PT, OT, SLP next month No driving for now , will reassess next month RTC 4wks  2.  Hypertension, was normotensive at rehab but systolic BP is now elevated, will f/u with PCP today  3.  HA post stroke mild and occ, will hold off on initiating prophyllactic therapy at this point, if this progresses would rec topiramate  4.  Left hand swelling ,non painful dependant edema, rec retrograde massage while hand  is above level of heart

## 2016-12-31 NOTE — Patient Instructions (Addendum)
No driving until evaluation next month  Continue home health therapy for now but will likely need to start outpatient therapy next month  Follow-up with your primary care physician on your blood pressure  Make sure you keep your appointment with the neurologist, Dr. Pearlean Brownie. He will also make sure that you have the appropriate follow-up with the doctor who placed the stents  Elevate your left hand above your heart and do massage from the fingers to the wrist 100 strokes twice a day  If headaches are worse. Please give me a call. We may have to start a medication called Topamax

## 2016-12-31 NOTE — Progress Notes (Signed)
Subjective:    Patient ID: Jason Nolan, male    DOB: [birth year], 71 y.o.   MRN: [id number]  HPI  Pt presents to the clinic today for [facility] Follow Up. He went to the ER 7/23 with c/o left side weakness, slurred speech and facial droop. 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 He underwent a thrombectomy and stent placement. He was in the hospital for 3 weeks getting intensive PT, OT and ST. He was discharged home on 8/18. He has already followed up with Dr. Jodean Lima earlier today. They advised him to continue to work with home PT, OT, ST. He has been having some headaches, and left hand swelling. Dr. Wynn Banker wanted to hold off on Topirimate. He advised Mr. Schneekloth to elevate his left hand above his heart. He also advised him to follow up with me regarding his blood pressure and Dr. Pearlean Brownie, for post stroke followup. His BP today is 152/90. He is currently only taking Brillinta, ASA and Lipitor.  Review of Systems      Past Medical History:  Diagnosis Date  . Glaucoma   . Hepatitis C     Current Outpatient Prescriptions  Medication Sig Dispense Refill  . aspirin 81 MG chewable tablet Chew 1 tablet (81 mg total) by mouth daily.    Marland Kitchen atorvastatin (LIPITOR) 40 MG tablet Take 1 tablet (40 mg total) by mouth daily at 6 PM. 30 tablet 0  . latanoprost (XALATAN) 0.005 % ophthalmic solution Place 1 drop into both eyes at bedtime. 2.5 mL 12  . PAZEO 0.7 % SOLN Place 1 drop into both eyes daily.     . ticagrelor (BRILINTA) 90 MG TABS tablet Take 1 tablet (90 mg total) by mouth 2 (two) times daily. 60 tablet 0  . VYZULTA 0.024 % SOLN Apply 1 drop to eye 2 (two) times daily.      No current facility-administered medications for this visit.     No Known Allergies  Family History  Problem Relation Age of Onset  . Hypertension Mother   . Hypertension Father     Social History   Social History  . Marital status: Married    Spouse name: N/A  . Number of children: N/A  .  Years of education: N/A   Occupational History  . Not on file.   Social History Main Topics  . Smoking status: Former Games developer  . Smokeless tobacco: Former Neurosurgeon    Quit date: 05/06/1993  . Alcohol use Not on file  . Drug use: No  . Sexual activity: Yes    Partners: Female    Birth control/ protection: None   Other Topics Concern  . Not on file   Social History Narrative  . No narrative on file     Constitutional: Pt reports headaches. Denies fever, malaise, fatigue, or abrupt weight changes.  HEENT: Denies eye pain, eye redness, ear pain, ringing in the ears, wax buildup, runny nose, nasal congestion, bloody nose, or sore throat. Respiratory: Denies difficulty breathing, shortness of breath, cough or sputum production.   Cardiovascular: Denies chest pain, chest tightness, palpitations or swelling in the hands or feet.  Musculoskeletal: Pt reports difficulty with gait. Denies decrease in range of motion, muscle pain or joint pain and swelling.  Skin: Denies redness, rashes, lesions or ulcercations.  Neurological: Pt reports difficulty with speech. Denies dizziness, difficulty with memory, or problems with balance and coordination.    No other specific complaints in a complete review of systems (  except as listed in HPI above).  Objective:   Physical Exam   BP (!) 152/90   Pulse 61   Temp 97.8 F (36.6 C) (Oral)   Wt 199 lb 8 oz (90.5 kg)   SpO2 97%   BMI 28.63 kg/m  Wt Readings from Last 3 Encounters:  12/31/16 199 lb 8 oz (90.5 kg)  12/18/16 190 lb 8.4 oz (86.4 kg)  11/25/16 213 lb 10 oz (96.9 kg)    General: Appears his stated age, well developed, well nourished in NAD. Cardiovascular: Normal rate and rhythm. S1,S2 noted.  No murmur, rubs or gallops noted. No JVD or BLE edema. No carotid bruits noted. Pulmonary/Chest: Normal effort and positive vesicular breath sounds. No respiratory distress. No wheezes, rales or ronchi noted.  Musculoskeletal: Gait slow but steady  with use of cane. Neurological: Alert and oriented. He is speech is slow and mildly slurred.  BMET    Component Value Date/Time   NA 141 12/19/2016 0406   K 3.5 12/19/2016 0406   CL 108 12/19/2016 0406   CO2 26 12/19/2016 0406   GLUCOSE 100 (H) 12/19/2016 0406   BUN 13 12/19/2016 0406   CREATININE 0.94 12/19/2016 0406   CALCIUM 8.9 12/19/2016 0406   GFRNONAA >60 12/19/2016 0406   GFRAA >60 12/19/2016 0406    Lipid Panel     Component Value Date/Time   CHOL 193 11/26/2016 0420   TRIG 197 (H) 11/26/2016 0420   HDL 57 11/26/2016 0420   CHOLHDL 3.4 11/26/2016 0420   VLDL 39 11/26/2016 0420   LDLCALC 97 11/26/2016 0420    CBC    Component Value Date/Time   WBC 3.3 (L) 12/19/2016 0406   RBC 4.44 12/19/2016 0406   HGB 13.5 12/19/2016 0406   HCT 42.1 12/19/2016 0406   PLT 297 12/19/2016 0406   MCV 94.8 12/19/2016 0406   MCH 30.4 12/19/2016 0406   MCHC 32.1 12/19/2016 0406   RDW 13.8 12/19/2016 0406   LYMPHSABS 1.3 11/26/2016 0420   MONOABS 0.2 11/26/2016 0420   EOSABS 0.1 11/26/2016 0420   BASOSABS 0.0 11/26/2016 0420    Hgb A1C Lab Results  Component Value Date   HGBA1C 5.4 11/26/2016           Assessment & Plan:   [facility] Follow Up for Stroke:  Hospital notes, labs and imaging reviewed He needs to continue to work with PT/OT Will monitor headaches for now He will continue brace to left wrist, elevate it per Dr. Wynn Banker recommendation Will start Norvasc 5 mg daily, for HTN control Continue Lipitor, ASA and Brillinta  RTC in 2 weeks for follow up HTN BAITY, REGINA, NP

## 2017-01-01 DIAGNOSIS — I69322 Dysarthria following cerebral infarction: Secondary | ICD-10-CM | POA: Diagnosis not present

## 2017-01-01 DIAGNOSIS — I69320 Aphasia following cerebral infarction: Secondary | ICD-10-CM | POA: Diagnosis not present

## 2017-01-01 DIAGNOSIS — E46 Unspecified protein-calorie malnutrition: Secondary | ICD-10-CM | POA: Diagnosis not present

## 2017-01-01 DIAGNOSIS — B1920 Unspecified viral hepatitis C without hepatic coma: Secondary | ICD-10-CM | POA: Diagnosis not present

## 2017-01-01 DIAGNOSIS — I10 Essential (primary) hypertension: Secondary | ICD-10-CM | POA: Diagnosis not present

## 2017-01-01 DIAGNOSIS — I69354 Hemiplegia and hemiparesis following cerebral infarction affecting left non-dominant side: Secondary | ICD-10-CM | POA: Diagnosis not present

## 2017-01-03 DIAGNOSIS — E46 Unspecified protein-calorie malnutrition: Secondary | ICD-10-CM | POA: Diagnosis not present

## 2017-01-03 DIAGNOSIS — B1920 Unspecified viral hepatitis C without hepatic coma: Secondary | ICD-10-CM | POA: Diagnosis not present

## 2017-01-03 DIAGNOSIS — I69320 Aphasia following cerebral infarction: Secondary | ICD-10-CM | POA: Diagnosis not present

## 2017-01-03 DIAGNOSIS — I69322 Dysarthria following cerebral infarction: Secondary | ICD-10-CM | POA: Diagnosis not present

## 2017-01-03 DIAGNOSIS — I69354 Hemiplegia and hemiparesis following cerebral infarction affecting left non-dominant side: Secondary | ICD-10-CM | POA: Diagnosis not present

## 2017-01-03 DIAGNOSIS — I10 Essential (primary) hypertension: Secondary | ICD-10-CM | POA: Diagnosis not present

## 2017-01-07 DIAGNOSIS — B1920 Unspecified viral hepatitis C without hepatic coma: Secondary | ICD-10-CM | POA: Diagnosis not present

## 2017-01-07 DIAGNOSIS — I69354 Hemiplegia and hemiparesis following cerebral infarction affecting left non-dominant side: Secondary | ICD-10-CM | POA: Diagnosis not present

## 2017-01-07 DIAGNOSIS — I69320 Aphasia following cerebral infarction: Secondary | ICD-10-CM | POA: Diagnosis not present

## 2017-01-07 DIAGNOSIS — I69322 Dysarthria following cerebral infarction: Secondary | ICD-10-CM | POA: Diagnosis not present

## 2017-01-07 DIAGNOSIS — I10 Essential (primary) hypertension: Secondary | ICD-10-CM | POA: Diagnosis not present

## 2017-01-07 DIAGNOSIS — E46 Unspecified protein-calorie malnutrition: Secondary | ICD-10-CM | POA: Diagnosis not present

## 2017-01-08 DIAGNOSIS — I69320 Aphasia following cerebral infarction: Secondary | ICD-10-CM | POA: Diagnosis not present

## 2017-01-08 DIAGNOSIS — I69322 Dysarthria following cerebral infarction: Secondary | ICD-10-CM | POA: Diagnosis not present

## 2017-01-08 DIAGNOSIS — E46 Unspecified protein-calorie malnutrition: Secondary | ICD-10-CM | POA: Diagnosis not present

## 2017-01-08 DIAGNOSIS — I10 Essential (primary) hypertension: Secondary | ICD-10-CM | POA: Diagnosis not present

## 2017-01-08 DIAGNOSIS — B1920 Unspecified viral hepatitis C without hepatic coma: Secondary | ICD-10-CM | POA: Diagnosis not present

## 2017-01-08 DIAGNOSIS — I69354 Hemiplegia and hemiparesis following cerebral infarction affecting left non-dominant side: Secondary | ICD-10-CM | POA: Diagnosis not present

## 2017-01-09 DIAGNOSIS — I69322 Dysarthria following cerebral infarction: Secondary | ICD-10-CM | POA: Diagnosis not present

## 2017-01-09 DIAGNOSIS — I69354 Hemiplegia and hemiparesis following cerebral infarction affecting left non-dominant side: Secondary | ICD-10-CM | POA: Diagnosis not present

## 2017-01-09 DIAGNOSIS — B1920 Unspecified viral hepatitis C without hepatic coma: Secondary | ICD-10-CM | POA: Diagnosis not present

## 2017-01-09 DIAGNOSIS — E46 Unspecified protein-calorie malnutrition: Secondary | ICD-10-CM | POA: Diagnosis not present

## 2017-01-09 DIAGNOSIS — I10 Essential (primary) hypertension: Secondary | ICD-10-CM | POA: Diagnosis not present

## 2017-01-09 DIAGNOSIS — I69320 Aphasia following cerebral infarction: Secondary | ICD-10-CM | POA: Diagnosis not present

## 2017-01-10 DIAGNOSIS — I69322 Dysarthria following cerebral infarction: Secondary | ICD-10-CM | POA: Diagnosis not present

## 2017-01-10 DIAGNOSIS — I10 Essential (primary) hypertension: Secondary | ICD-10-CM | POA: Diagnosis not present

## 2017-01-10 DIAGNOSIS — I69320 Aphasia following cerebral infarction: Secondary | ICD-10-CM | POA: Diagnosis not present

## 2017-01-10 DIAGNOSIS — B1920 Unspecified viral hepatitis C without hepatic coma: Secondary | ICD-10-CM | POA: Diagnosis not present

## 2017-01-10 DIAGNOSIS — I69354 Hemiplegia and hemiparesis following cerebral infarction affecting left non-dominant side: Secondary | ICD-10-CM | POA: Diagnosis not present

## 2017-01-10 DIAGNOSIS — E46 Unspecified protein-calorie malnutrition: Secondary | ICD-10-CM | POA: Diagnosis not present

## 2017-01-13 DIAGNOSIS — E46 Unspecified protein-calorie malnutrition: Secondary | ICD-10-CM | POA: Diagnosis not present

## 2017-01-13 DIAGNOSIS — B1920 Unspecified viral hepatitis C without hepatic coma: Secondary | ICD-10-CM | POA: Diagnosis not present

## 2017-01-13 DIAGNOSIS — I69320 Aphasia following cerebral infarction: Secondary | ICD-10-CM | POA: Diagnosis not present

## 2017-01-13 DIAGNOSIS — I69322 Dysarthria following cerebral infarction: Secondary | ICD-10-CM | POA: Diagnosis not present

## 2017-01-13 DIAGNOSIS — I69354 Hemiplegia and hemiparesis following cerebral infarction affecting left non-dominant side: Secondary | ICD-10-CM | POA: Diagnosis not present

## 2017-01-13 DIAGNOSIS — I10 Essential (primary) hypertension: Secondary | ICD-10-CM | POA: Diagnosis not present

## 2017-01-14 ENCOUNTER — Encounter: Payer: Self-pay | Admitting: Internal Medicine

## 2017-01-14 ENCOUNTER — Ambulatory Visit (INDEPENDENT_AMBULATORY_CARE_PROVIDER_SITE_OTHER): Payer: Medicare Other | Admitting: Internal Medicine

## 2017-01-14 VITALS — BP 144/82 | HR 67 | Temp 98.2°F | Wt 198.5 lb

## 2017-01-14 DIAGNOSIS — I10 Essential (primary) hypertension: Secondary | ICD-10-CM | POA: Diagnosis not present

## 2017-01-14 DIAGNOSIS — G8194 Hemiplegia, unspecified affecting left nondominant side: Secondary | ICD-10-CM

## 2017-01-14 DIAGNOSIS — I69320 Aphasia following cerebral infarction: Secondary | ICD-10-CM | POA: Diagnosis not present

## 2017-01-14 DIAGNOSIS — I69322 Dysarthria following cerebral infarction: Secondary | ICD-10-CM | POA: Diagnosis not present

## 2017-01-14 DIAGNOSIS — I69354 Hemiplegia and hemiparesis following cerebral infarction affecting left non-dominant side: Secondary | ICD-10-CM | POA: Diagnosis not present

## 2017-01-14 DIAGNOSIS — R4781 Slurred speech: Secondary | ICD-10-CM | POA: Diagnosis not present

## 2017-01-14 DIAGNOSIS — I639 Cerebral infarction, unspecified: Secondary | ICD-10-CM | POA: Diagnosis not present

## 2017-01-14 DIAGNOSIS — E46 Unspecified protein-calorie malnutrition: Secondary | ICD-10-CM | POA: Diagnosis not present

## 2017-01-14 DIAGNOSIS — B1920 Unspecified viral hepatitis C without hepatic coma: Secondary | ICD-10-CM | POA: Diagnosis not present

## 2017-01-14 MED ORDER — AMLODIPINE BESYLATE 10 MG PO TABS: 10.0000 mg | ORAL_TABLET | Freq: Every day | ORAL | 0 refills | Status: DC

## 2017-01-14 MED ORDER — TICAGRELOR 90 MG PO TABS: 90.0000 mg | ORAL_TABLET | Freq: Two times a day (BID) | ORAL | 0 refills | Status: DC

## 2017-01-14 MED ORDER — ATORVASTATIN CALCIUM 40 MG PO TABS: 40.0000 mg | ORAL_TABLET | Freq: Every day | ORAL | 0 refills | Status: DC

## 2017-01-14 NOTE — Progress Notes (Signed)
Subjective:    Patient ID: Terrilee CroakJames Fouts, male    DOB: 01-21-1946, 71 y.o.   MRN: [id number]  HPI  Pt presents to the clinic today for 2 week follow up of HTN. At his last visit, he was started on Amlodipine 5 mg daily. He has been taking the medication as prescribed. He denies adverse side effects. His BP today is 144/82.  He has noticed increases swelling in his left hand since starting Amlodipine but he is not sure if this is related or not. He wears the brace on his left wrist all the time. He reports he has not been elevating his left arm as suggested by PT.  He also request referral for outpatient PT/OT and speech therapy for ongoing left side weakness and slurred speech.  He is also request a handicap placard for the next few months, until he sees if his weakness improves.          Review of Systems      Past Medical History:  Diagnosis Date  . Glaucoma   . Hepatitis C     Current Outpatient Prescriptions  Medication Sig Dispense Refill  . amLODipine (NORVASC) 5 MG tablet Take 1 tablet (5 mg total) by mouth daily. 30 tablet 0  . aspirin 81 MG chewable tablet Chew 1 tablet (81 mg total) by mouth daily.    Marland Kitchen. atorvastatin (LIPITOR) 40 MG tablet Take 1 tablet (40 mg total) by mouth daily at 6 PM. 30 tablet 0  . latanoprost (XALATAN) 0.005 % ophthalmic solution Place 1 drop into both eyes at bedtime. 2.5 mL 12  . PAZEO 0.7 % SOLN Place 1 drop into both eyes daily.     . ticagrelor (BRILINTA) 90 MG TABS tablet Take 1 tablet (90 mg total) by mouth 2 (two) times daily. 60 tablet 0  . VYZULTA 0.024 % SOLN Apply 1 drop to eye 2 (two) times daily.      No current facility-administered medications for this visit.     No Known Allergies  Family History  Problem Relation Age of Onset  . Hypertension Mother   . Hypertension Father     Social History   Social History  . Marital status: Married    Spouse name: N/A  . Number of children: N/A  . Years of education: N/A    Occupational History  . Not on file.   Social History Main Topics  . Smoking status: Former Games developermoker  . Smokeless tobacco: Former NeurosurgeonUser    Quit date: 05/06/1993  . Alcohol use Not on file  . Drug use: No  . Sexual activity: Yes    Partners: Female    Birth control/ protection: None   Other Topics Concern  . Not on file   Social History Narrative  . No narrative on file     Constitutional: Denies fever, malaise, fatigue, headache or abrupt weight changes.  Respiratory: Denies difficulty breathing, shortness of breath, cough or sputum production.   Cardiovascular: Pt reports left head swelling. Denies chest pain, chest tightness, palpitations or swelling in the hands or feet.  Neurological: Pt reports difficulty with speech. Denies dizziness, difficulty with memory    No other specific complaints in a complete review of systems (except as listed in HPI above).  Objective:   Physical Exam   BP (!) 144/82   Pulse 67   Temp 98.2 F (36.8 C) (Oral)   Wt 198 lb 8 oz (90 kg)   SpO2 98%  BMI 28.48 kg/m  Wt Readings from Last 3 Encounters:  01/14/17 198 lb 8 oz (90 kg)  12/31/16 199 lb 8 oz (90.5 kg)  12/18/16 190 lb 8.4 oz (86.4 kg)    General: Appears his stated age, well developed, well nourished in NAD. Cardiovascular: Normal rate and rhythm. S1,S2 noted.  No murmur, rubs or gallops noted.  Pulmonary/Chest: Normal effort and positive vesicular breath sounds. No respiratory distress. No wheezes, rales or ronchi noted.  MSK: Left arm strength 4/5. 2+ swelling of left hand noted.  Neurological: Alert and oriented. Slurred speech noted.  BMET    Component Value Date/Time   NA 141 12/19/2016 0406   K 3.5 12/19/2016 0406   CL 108 12/19/2016 0406   CO2 26 12/19/2016 0406   GLUCOSE 100 (H) 12/19/2016 0406   BUN 13 12/19/2016 0406   CREATININE 0.94 12/19/2016 0406   CALCIUM 8.9 12/19/2016 0406   GFRNONAA >60 12/19/2016 0406   GFRAA >60 12/19/2016 0406    Lipid  Panel     Component Value Date/Time   CHOL 193 11/26/2016 0420   TRIG 197 (H) 11/26/2016 0420   HDL 57 11/26/2016 0420   CHOLHDL 3.4 11/26/2016 0420   VLDL 39 11/26/2016 0420   LDLCALC 97 11/26/2016 0420    CBC    Component Value Date/Time   WBC 3.3 (L) 12/19/2016 0406   RBC 4.44 12/19/2016 0406   HGB 13.5 12/19/2016 0406   HCT 42.1 12/19/2016 0406   PLT 297 12/19/2016 0406   MCV 94.8 12/19/2016 0406   MCH 30.4 12/19/2016 0406   MCHC 32.1 12/19/2016 0406   RDW 13.8 12/19/2016 0406   LYMPHSABS 1.3 11/26/2016 0420   MONOABS 0.2 11/26/2016 0420   EOSABS 0.1 11/26/2016 0420   BASOSABS 0.0 11/26/2016 0420    Hgb A1C Lab Results  Component Value Date   HGBA1C 5.4 11/26/2016           Assessment & Plan:   Stroke with Left Hemiparesis and Slurred Speech:  Handicap placard given today Orders for PT/OT and Speech Therapy placed Refilled Brillinta and Lipitor per request  Left Hand Swelling:  I think his brace is too tight, discussed loosening this Advised him to keep his hand elevated above the level of his heart  RTC in 2 weeks for BP check Deniss Wormley, NP

## 2017-01-14 NOTE — Patient Instructions (Signed)

## 2017-01-14 NOTE — Assessment & Plan Note (Signed)
Improved but still not at goal Increase Amlodipine to 10 mg daily Encouraged him to consume a DASH diet and increase aerobic exercise  RTC in 2 weeks for BP followup

## 2017-01-15 DIAGNOSIS — E46 Unspecified protein-calorie malnutrition: Secondary | ICD-10-CM | POA: Diagnosis not present

## 2017-01-15 DIAGNOSIS — I69354 Hemiplegia and hemiparesis following cerebral infarction affecting left non-dominant side: Secondary | ICD-10-CM | POA: Diagnosis not present

## 2017-01-15 DIAGNOSIS — I69322 Dysarthria following cerebral infarction: Secondary | ICD-10-CM | POA: Diagnosis not present

## 2017-01-15 DIAGNOSIS — B1920 Unspecified viral hepatitis C without hepatic coma: Secondary | ICD-10-CM | POA: Diagnosis not present

## 2017-01-15 DIAGNOSIS — I10 Essential (primary) hypertension: Secondary | ICD-10-CM | POA: Diagnosis not present

## 2017-01-15 DIAGNOSIS — I69320 Aphasia following cerebral infarction: Secondary | ICD-10-CM | POA: Diagnosis not present

## 2017-01-18 ENCOUNTER — Other Ambulatory Visit: Payer: Self-pay | Admitting: Internal Medicine

## 2017-01-18 DIAGNOSIS — I639 Cerebral infarction, unspecified: Secondary | ICD-10-CM

## 2017-01-20 ENCOUNTER — Ambulatory Visit: Payer: Medicare Other | Attending: Internal Medicine | Admitting: Occupational Therapy

## 2017-01-20 DIAGNOSIS — R278 Other lack of coordination: Secondary | ICD-10-CM | POA: Diagnosis not present

## 2017-01-20 DIAGNOSIS — M6281 Muscle weakness (generalized): Secondary | ICD-10-CM | POA: Insufficient documentation

## 2017-01-20 DIAGNOSIS — R2681 Unsteadiness on feet: Secondary | ICD-10-CM | POA: Insufficient documentation

## 2017-01-20 NOTE — Therapy (Signed)
[facility] MAIN [facility] SERVICES [address] [place], [place], [address] Phone: [phone]   Fax:  [phone]  Occupational Therapy Evaluation  Patient Details  Name: Jason Nolan MRN: [id number] Date of Birth: 07-01-1945 Referring Provider: Dr. Wynn Banker  Encounter Date: 01/20/2017      OT End of Session - 01/20/17 1731    Visit Number 1   Number of Visits 24   Date for OT Re-Evaluation 04/14/17   Authorization Type Medicare G-Code 1 of 10   OT Start Time 1515   OT Stop Time 1630   OT Time Calculation (min) 75 min   Activity Tolerance Patient tolerated treatment well   Behavior During Therapy [facility] for tasks assessed/performed      Past Medical History:  Diagnosis Date  . Glaucoma   . Hepatitis C     Past Surgical History:  Procedure Laterality Date  . IR ANGIO INTRA EXTRACRAN SEL COM CAROTID INNOMINATE UNI L MOD SED  11/25/2016  . IR ANGIO VERTEBRAL SEL SUBCLAVIAN INNOMINATE UNI R MOD SED  11/25/2016  . IR ANGIO VERTEBRAL SEL VERTEBRAL UNI L MOD SED  11/25/2016  . IR INTRAVSC STENT CERV CAROTID W/O EMB-PROT MOD SED INC ANGIO  11/25/2016  . IR PERCUTANEOUS ART THROMBECTOMY/INFUSION INTRACRANIAL INC DIAG ANGIO  11/25/2016  . RADIOLOGY WITH ANESTHESIA N/A 11/25/2016   Procedure: RADIOLOGY WITH ANESTHESIA;  Surgeon: Radiologist, Medication, MD;  Location: [facility] OR;  Service: Radiology;  Laterality: N/A;    There were no vitals filed for this visit.      Subjective Assessment - 01/20/17 1715    Subjective  Pt. reports he has noticed changes with his speech.   Patient is accompained by: Family member   Pertinent History Pt. is a 71 y.o. 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 Pt. was transferred to [facility]. He underwent CTA brain showed short segment near occlusion of proximal R-ICA with associated intraluminal thrombus --likely due to dissection and emergent large vessel occlusion with right M2 occlusion and evolving  right frontal lobe infarct.  He underwent cerebral angio with  complete revascularization of occluded superior division of R-MCA and near complete revascularization of proximal R-ICA with stent assisted angioplasty.  Stroke felt to be embolic in setting of ruptured plaque R-ICA and he was placed on ASA and brillinta due to stent. Pt. was transferred to inpatient rehab for several weeks, had home health therapy upon discharge, and now is ready for outpatient therapy services.   Limitations Dominant LUE functioning, left hand edema, distal ROM, coordination, and overal strength.   Patient Stated Goals To regain the use of his LUE.   Currently in Pain? No/denies           [facility] OT Assessment - 01/20/17 1529      Assessment   Diagnosis CVA   Referring Provider Dr. Wynn Banker   Onset Date 11/25/16     Precautions   Required Braces or Orthoses Other Brace/Splint     Balance Screen   Has the patient fallen in the past 6 months No   Has the patient had a decrease in activity level because of a fear of falling?  No   Is the patient reluctant to leave their home because of a fear of falling?  No     Home  Environment   Family/patient expects to be discharged to: Private residence   Living Arrangements Spouse/significant other   Available Help at Discharge Family   Type of  Home House   Home Access Stairs  one step   Home Layout One level   Bathroom Press photographer [place] - single point;Bedside commode;Walker - 2 wheels;Grab bars - tub/shower   Lives With Spouse     Prior Function   Level of Independence Independent   Vocation Retired  Drove Teacher, early years/pre in [place]   Leisure Play Golf     ADL   Eating/Feeding --  Uses right hand   Grooming --  Uses his nondominant LUE with increased time.   Upper Body Bathing --  uses nondominant LUE with Increased time to complete.   Lower Body Bathing Increased time   Upper Body Dressing Increased  time  LUE   Lower Body Dressing Increased time   Toilet Transfer --  increased time, nondominant LUE   Toileting -  Hygiene Modified Independent   Tub/Shower Transfer Modified independent     IADL   Shopping Needs to be accompanied on any shopping trip   Light Housekeeping Needs help with all home maintenance tasks   Meal Prep Able to complete simple warm meal prep   Medication Management --  Pt.'s wife is assisting pt., however he can do it.   Financial Management Manages financial matters independently (budgets, writes checks, pays rent, bills goes to bank), collects and keeps track of income     Written Expression   Dominant Hand Left   Handwriting Not legible     Vision - History   Baseline Vision Wears glasses all the time  Very new progressive Lens.  Pt. has Glaucoma.     Cognition   Overall Cognitive Status Within Functional Limits for tasks assessed     Sensation   Light Touch Appears Intact   Proprioception Appears Intact     Coordination   Gross Motor Movements are Fluid and Coordinated No   Fine Motor Movements are Fluid and Coordinated No     Edema - Water Displacement (mL)   Left Hand --  circumference measurement: Left wrist: 20 cm, MPs: 25.5      AROM   Overall AROM Comments Left Shoulder scaption 130, flexion 122, abduction 90, wrist extension 28, wrist 54. Digit flexion to [facility]: 2nd: 8 cm, 3rd: 5.5cm, 4th: 2cm. Digit extension: MP: 2nd: 35, 3rd: 40, 4th: 45. 5th: 45, PIPs: 2nd: 50, 3rd: 75, 4th: 75, 5th: 80, DIP: 2nd: 25, 3rd: 45, 4th: 55, 5th: 60.     Strength   Overall Strength Comments LUE strength: shoulder flexion 4+/5, abduction 4+/5, elbow flexion, extension 4+/5, wrist extension 3-/5.     OT TREATMENT    Manual Therapy:  Pt. Tolerated retrograde massage to the Left hand, carpal rolls, and metacarpal spread stretches for edema control. Pt. Education was provided about edema control techniques, and ROM.                        OT Education - 01/20/17 1730    Education provided Yes   Education Details LUE functioning, edema control techniques   Person(s) Educated Patient   Methods Explanation   Comprehension Verbalized understanding;Returned demonstration;Tactile cues required;Need further instruction             OT Long Term Goals - 01/20/17 1741      OT LONG TERM GOAL #1   Title Pt. will improve edema by 2 cm through MPs in preparation for ROM.   Baseline wrist: 20 cm,  MPs: 25.5   Time 12   Period Weeks   Status New   Target Date 04/14/17     OT LONG TERM GOAL #2   Title Pt. will improve Left hand digit extension in preparation for weightbearing, and releasing objects during ADLs/IADLs.   Baseline Pt. is unable to achieve full digit extension on the left.   Time 12   Period Weeks   Status New   Target Date 04/14/17     OT LONG TERM GOAL #3   Title Pt. left hand grip with  increase by 5# in preparation for holding a cup.   Baseline Left 2#, RIght: 60   Time 12   Period Weeks   Status New   Target Date 04/14/17     OT LONG TERM GOAL #4   Title Pt. will increased right shoulder strength by 1 mm grade to assist with ADLs.   Baseline Impaired LUE strength   Time 12   Period Weeks   Status New   Target Date 04/14/17     OT LONG TERM GOAL #5   Title Pt. will increase left wrist extension in preparation for reaching ADL items.   Baseline wrist extension 28 degrees   Time 12   Period Weeks   Status New   Target Date 04/14/17     OT LONG TERM GOAL #6   Title Pt. will improve left hand coordination skills to be able to button clothing.   Baseline Pt. is unable   Time 12   Period Weeks   Status New   Target Date 04/14/17     OT LONG TERM GOAL #7   Title Pt. will independently demonstrate visual compensatory strategies duirng ADLs, and IADLs.   Baseline Glaucoma   Time 12   Period Weeks   Status New   Target Date 04/14/17               Plan - 02-06-2017 1732    Clinical  Impression Statement Pt. is a 71 y.o. male who was admitted to [facility] with a CVA with Left hemiparesis. Pt. presents with decreased LUE strength proximally, and distally. Pt. presents with limited ROM, strength, and coordination distally, as well as increased edema through his left hand, and digits. Pt. is unable to use his dominant LUE to engage in ADL, and IADL tasks. Pt. requires the use of his nondominant UE. Pt. could benefit from skilled OT services to work on improving LUE functioning in order to be able to use his hand during ADL, and IADL tasks.   Occupational performance deficits (Please refer to evaluation for details): ADL's;IADL's   Rehab Potential Excellent   OT Frequency 2x / week   OT Duration 12 weeks   OT Treatment/Interventions Self-care/ADL training;Therapeutic exercise;Patient/family education;Energy conservation;Therapeutic activities;Manual Therapy;DME and/or AE instruction;Therapeutic exercises;Moist Heat;Passive range of motion;Visual/perceptual remediation/compensation   Clinical Decision Making Multiple treatment options, significant modification of task necessary   Consulted and Agree with Plan of Care Patient      Patient will benefit from skilled therapeutic intervention in order to improve the following deficits and impairments:  Abnormal gait, Pain, Decreased strength, Decreased range of motion, Impaired UE functional use, Decreased coordination, Decreased knowledge of use of DME, Decreased activity tolerance, Difficulty walking, Decreased balance, Other (comment) (Left hand edema)  Visit Diagnosis: Muscle weakness (generalized)  Other lack of coordination      G-Codes - 2017-02-06 1759    Functional Limitation Carrying, moving and handling objects   Carrying,  Moving and Handling Objects Current Status [phone]) At least 60 percent but less than 80 percent impaired, limited or restricted   Carrying, Moving and Handling Objects Goal Status ([id number]) At least 20 percent  but less than 40 percent impaired, limited or restricted      Problem List Patient Active Problem List   Diagnosis Date Noted  . Left hemiparesis (HCC)   . Essential hypertension 11/29/2016  . 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 Hepatitis C 07/22/2013    Olegario Messier, MS, OTR/L 01/20/2017, 6:00 PM  [facility] MAIN [facility] SERVICES [address] [place], [place], [address] Phone: [phone]   Fax:  [phone]  Name: Yashas Camilli MRN: [id number] Date of Birth: 09/21/1945

## 2017-01-23 ENCOUNTER — Other Ambulatory Visit (HOSPITAL_COMMUNITY): Payer: Self-pay | Admitting: Interventional Radiology

## 2017-01-23 DIAGNOSIS — I639 Cerebral infarction, unspecified: Secondary | ICD-10-CM

## 2017-01-27 ENCOUNTER — Encounter: Payer: Self-pay | Admitting: Physical Therapy

## 2017-01-27 ENCOUNTER — Ambulatory Visit: Payer: Medicare Other | Admitting: Occupational Therapy

## 2017-01-27 ENCOUNTER — Encounter: Payer: Self-pay | Admitting: Occupational Therapy

## 2017-01-27 ENCOUNTER — Ambulatory Visit: Payer: Medicare Other | Admitting: Physical Therapy

## 2017-01-27 VITALS — BP 134/76

## 2017-01-27 DIAGNOSIS — M6281 Muscle weakness (generalized): Secondary | ICD-10-CM

## 2017-01-27 DIAGNOSIS — R2681 Unsteadiness on feet: Secondary | ICD-10-CM | POA: Diagnosis not present

## 2017-01-27 DIAGNOSIS — R278 Other lack of coordination: Secondary | ICD-10-CM | POA: Diagnosis not present

## 2017-01-27 NOTE — Patient Instructions (Signed)
Abduction: Clam (Eccentric) - Side-Lying    Lie on side with knees bent. Lift top knee, keeping feet together. Keep trunk steady. Slowly lower for 3-5 seconds. __20_ reps per set, __2_ sets per day, _5__ days per week.   http://ecce.exer.us/64   Copyright  VHI. All rights reserved.  Bridge    Lie back, legs bent. Inhale, pressing hips up. Keeping ribs in, lengthen lower back. Exhale, rolling down along spine from top. Repeat __20__ times. Do __2__ sessions per day.  http://pm.exer.us/54   Copyright  VHI. All rights reserved.

## 2017-01-27 NOTE — Therapy (Signed)
[facility] MAIN [facility] SERVICES [address] [place], [place], [address] Phone: [phone]   Fax:  [phone]  Physical Therapy Evaluation  Patient Details  Name: Jason Nolan MRN: [id number] Date of Birth: 10/23/1945 Referring Provider: [name]  Encounter Date: 01/27/2017      PT End of Session - 01/27/17 1656    Visit Number 1   Number of Visits 25   Date for PT Re-Evaluation 04/21/17   Authorization Type g code 1/10   PT Start Time 0403   PT Stop Time 0452   PT Time Calculation (min) 49 min   Equipment Utilized During Treatment Gait belt   Activity Tolerance Patient tolerated treatment well   Behavior During Therapy [facility] for tasks assessed/performed      Past Medical History:  Diagnosis Date  . Glaucoma   . Hepatitis C     Past Surgical History:  Procedure Laterality Date  . IR ANGIO INTRA EXTRACRAN SEL COM CAROTID INNOMINATE UNI L MOD SED  11/25/2016  . IR ANGIO VERTEBRAL SEL SUBCLAVIAN INNOMINATE UNI R MOD SED  11/25/2016  . IR ANGIO VERTEBRAL SEL VERTEBRAL UNI L MOD SED  11/25/2016  . IR INTRAVSC STENT CERV CAROTID Nolan/O EMB-PROT MOD SED INC ANGIO  11/25/2016  . IR PERCUTANEOUS ART THROMBECTOMY/INFUSION INTRACRANIAL INC DIAG ANGIO  11/25/2016  . RADIOLOGY WITH ANESTHESIA N/A 11/25/2016   Procedure: RADIOLOGY WITH ANESTHESIA;  Surgeon: Radiologist, Medication, MD;  Location: [facility] OR;  Service: Radiology;  Laterality: N/A;    Vitals:   01/27/17 1605  BP: 134/76         Subjective Assessment - 01/27/17 1608    Subjective Pt states that he is having issues mainly with his L UE, but states that he has weakness of his L LE.  He has switched from using a RW to a [facility] for ambulation and states he only uses his cane when he is outside his home.   Pertinent History Pt. is a 71 y.o. 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 Pt. was transferred to [facility]. He underwent CTA brain showed short segment  near occlusion of proximal R-ICA with associated intraluminal thrombus --likely due to dissection and emergent large vessel occlusion with right M2 occlusion and evolving right frontal lobe infarct.  He underwent cerebral angio with  complete revascularization of occluded superior division of R-MCA and near complete revascularization of proximal R-ICA with stent assisted angioplasty.  Stroke felt to be embolic in setting of ruptured plaque R-ICA and he was placed on ASA and brillinta due to stent. Pt. was transferred to inpatient rehab for several weeks, had home health therapy upon discharge.   Limitations Walking;Lifting;House hold activities   How long can you sit comfortably? unlimited    How long can you stand comfortably? unlimited    How long can you walk comfortably? unsure   Patient Stated Goals "strengthen my whole L side"   Currently in Pain? No/denies   Pain Score 0-No pain   Multiple Pain Sites No    PAIN: denies pain at this time  POSTURE: WFL   PROM/AROM: WFL  STRENGTH:  Graded on a 0-5 scale Muscle Group Left Right  Shoulder flex 5/5 5/5  Shoulder Abd 5/5 5/5          Elbow 5/5 5/5      Hip Flex 4-/5 5/5  Hip Abd 4+/5 5/5  Hip Add 4-/5 4-/5  Hip Ext 4-/5 4+/5  Knee Flex 5/5 5/5  Knee Ext 5/5 5/5  Ankle DF 5/5 5/5  Ankle PF     SENSATION: intact B LE's dermatomal testing   Able to follow 3 step command   Coordination - able to hit target on first try, slower speed on L compared to R  FUNCTIONAL MOBILITY: WFL   BALANCE: SLS - <3 seconds on L LE Tandem with L foot behind: 20 seconds before LOB   GAIT: Pt ambulates in public spaces using SPC.  Without AD, pt ambulates with decreased L heel strike, decreased L knee flexion in swing phase and increased L hip hike during swing phase to clear L toe.  Pt has occasional L toe dragging during swing phase secondary to weakness on L side.  OUTCOME MEASURES: -- All outcome measures assess without [place] of  AD TEST Outcome Interpretation  5 times sit<>stand 21 sec >60 yo, >15 sec indicates increased risk for falls  10 meter walk test 0.83               m/s <1.0 m/s indicates increased risk for falls; limited community ambulator  Timed up and Go  12               sec <14 sec indicates increased risk for falls  6 minute walk test   1205             Feet 1000 feet is community Financial controller 48/56 <36/56 (100% risk for falls), 37-45 (80% risk for falls); 46-51 (>50% risk for falls); 52-55 (lower risk <25% of falls)       Standing Dynamic Balance  Normal Stand independently unsupported, able to weight shift and cross midline maximally   Good Stand independently unsupported, able to weight shift and cross midline moderately   Good-/Fair+ Stand independently unsupported, able to weight shift across midline minimally x  Fair Stand independently unsupported, weight shift, and reach ipsilaterally, loss of balance when crossing midline   Poor+ Able to stand with Min A and reach ipsilaterally, unable to weight shift   Poor Able to stand with Mod A and minimally reach ipsilaterally, unable to cross midline.     Static Standing Balance  Normal Able to maintain standing balance against maximal resistance   Good Able to maintain standing balance against moderate resistance x  Good-/Fair+ Able to maintain standing balance against minimal resistance   Fair Able to stand unsupported without UE support and without LOB for 1-2 min   Fair- Requires Min A and UE support to maintain standing without loss of balance   Poor+ Requires mod A and UE support to maintain standing without loss of balance   Poor Requires max A and UE support to maintain standing balance without loss     Treatment: Sidelying clamshells x 20 reps B LE's - cues for proper form and technqiue Supine Bridges x 20 - cues for proper form        [facility] PT Assessment - 01/27/17 1612      Assessment   Medical Diagnosis  CVA   Referring Provider Jason Nolan   Onset Date/Surgical Date 11/25/16   Hand Dominance Left   Next MD Visit 01/31/17   Prior Therapy yes     Precautions   Precautions None   Required Braces or Orthoses Other Brace/Splint     Restrictions   Weight Bearing Restrictions No     Balance Screen   Has the patient fallen in the past  6 months Yes   How many times? 1   Has the patient had a decrease in activity level because of a fear of falling?  No   Is the patient reluctant to leave their home because of a fear of falling?  No     Home Environment   Living Environment Private residence   Living Arrangements Spouse/significant other   Available Help at Discharge Family   Type of Home House   Home Access Stairs to enter   Home Layout One level     Prior Function   Level of Independence Independent with basic ADLs   Vocation Retired   Leisure gym, golf             Objective measurements completed on examination: See above findings.                  PT Education - 01/27/17 1656    Education provided Yes   Education Details PT plan of care, HEP    Person(s) Educated Patient   Methods Explanation;Demonstration;Handout   Comprehension Verbalized understanding          PT Short Term Goals - 01/27/17 1704      PT SHORT TERM GOAL #1   Title Pt will improve 5x sit to stand to 18 seconds in order to demonstrate improved LE strength and improved dynamic balance.   Baseline 21 sec   Time 6   Period Weeks   Status New   Target Date 03/10/17     PT SHORT TERM GOAL #2   Title Pt will improve gait speed to at least 1.0 m/s with proper heel strike and decreased hip hike in order to demonstrate imrpoved LE strength and balance.   Baseline 0.83 m/s   Time 6   Period Weeks   Status New   Target Date 03/10/17     PT SHORT TERM GOAL #3   Title Pt will improve Berg Balance score to 51/56 in order to demonstrate improved dynamic and static balance,  decreasing overall falls risk.   Baseline 48/56   Time 6   Period Weeks   Status New   Target Date 03/10/17           PT Long Term Goals - 01/27/17 1707      PT LONG TERM GOAL #1   Title Pt will demonstrate independence with HEP in order to continue LE strengthening and manage symptoms.   Time 12   Period Weeks   Status New   Target Date 04/21/17     PT LONG TERM GOAL #2   Title Pt will improve 5x sit to stand to <15 seconds in order to demonstrate improved LE strength and dynamic balance.   Baseline 21 sec   Time 12   Period Weeks   Status New   Target Date 03/10/17     PT LONG TERM GOAL #3   Title Pt will demonstrate gait speed of at least 1.2 m/s in order to demonstrate improved LE strength and improved balance in order to return to prior level of funciton.   Baseline 0.833 m/s   Time 12   Period Weeks   Status New   Target Date 03/10/17     PT LONG TERM GOAL #4   Title Pt will improve Berg Balance score to at least 54/56 in order demonstrate improved dynamic and static balance, decreasing overall falls risk.   Baseline 48/56   Time 12   Period Weeks  Status New   Target Date 03/10/17     PT LONG TERM GOAL #5   Title Pt will demonstrate improved LE strength to 4+/5 in all limited planes in order to return to prior level of function.   Baseline gross strength: 4/5   Time 12   Period Weeks   Status New   Target Date 03/10/17     Additional Long Term Goals   Additional Long Term Goals Yes     PT LONG TERM GOAL #6   Title Pt will improve to >1500 feet without using AD in order to return to prior level of function, demonstrating improvements in LE strength, balance and endurance.   Baseline 1205 ft without AD   Time 12   Period Weeks   Status New   Target Date 03/10/17                Plan - February 13, 2017 1657    Clinical Impression Statement Pt is a 71 y/o male presenting today s/p L embolic CVA on 11/25/2016 resulting in residual weakness of L  UE and LE, decreased balance, abnormal gait and difficulty with speech.  Pt ambulates with SPC when in public, demonstrating decreased L heel strike, decreased knee flexion during swing phase and increased hip hike with swing phase in order to clear L toe.  Pt states that he has dizziniess and headaches at times, typically secondary to his BP that is now controlled via medication.  Pt has increased edema present of L hand/wrist region.  Sensation and coordination are intact B LE and coordination intact UE.  Pt demonstrates decrease of dynamic and static balance as noted with a Berg Balance score of 48/56, decreased gait speed.  Pt would benefit from skilled PT services in order to address LE weakness, balance deficits and imrpove ambulation in order to decrease risk of falls and return to prior level of function.   History and Personal Factors relevant to plan of care: changing BP, high prior level of function    Clinical Presentation Evolving   Clinical Presentation due to: fall risk, high BP levels controlled by medication   Clinical Decision Making Moderate   Rehab Potential Good   Clinical Impairments Affecting Rehab Potential high prior level of function   PT Frequency 2x / week   PT Duration 12 weeks   PT Treatment/Interventions ADLs/Self Care Home Management;Cryotherapy;Moist Heat;Gait training;Stair training;Functional mobility training;Therapeutic activities;Therapeutic exercise;Balance training;Neuromuscular re-education;Patient/family education;Manual techniques   PT Next Visit Plan begin LE strength and balance exercises    PT Home Exercise Plan sidelying clamshells, supine bridges   Consulted and Agree with Plan of Care Patient      Patient will benefit from skilled therapeutic intervention in order to improve the following deficits and impairments:  Abnormal gait, Decreased balance, Decreased strength, Difficulty walking, Increased edema  Visit Diagnosis: Muscle weakness  (generalized) - Plan: PT plan of care cert/re-cert  Unsteadiness on feet - Plan: PT plan of care cert/re-cert      G-Codes - 13-Feb-2017 1712    Functional Assessment Tool Used (Outpatient Only) 5x sit to stand, [employer], 80m WT, , clinical judgement   Functional Limitation Mobility: Walking and moving around   Mobility: Walking and Moving Around Current Status [phone]) At least 40 percent but less than 60 percent impaired, limited or restricted   Mobility: Walking and Moving Around Goal Status ([id number]) At least 20 percent but less than 40 percent impaired, limited or restricted  Problem List Patient Active Problem List   Diagnosis Date Noted  . Left hemiparesis (HCC)   . Essential hypertension 11/29/2016  . Acute ischemic stroke (HCC) - R MCA embolic stroke in setting of ruptured R ICA plaque Nolan dissection, s/p R MCA stent and thrombectomy 11/25/2016  . Hepatitis C 07/22/2013   This entire session was performed under direct supervision and direction of a licensed therapist/therapist assistant . I have personally read, edited and approve of the note as written. Stacey Drain, SPT Ezekiel Ina, PT, DPT  01/28/2017, 10:27 AM  [facility]  MAIN [facility] SERVICES [address] [place], [place], [address] Phone: [phone]   Fax:  [phone]  Name: Collie Wernick MRN: [id number] Date of Birth: 1946/04/30

## 2017-01-27 NOTE — Therapy (Signed)
[facility] MAIN [facility] SERVICES [address] [place], [place], [address] Phone: [phone]   Fax:  [phone]  Occupational Therapy Treatment  Patient Details  Name: Jason Nolan MRN: [id number] Date of Birth: 11-30-1945 Referring Provider: Dr. Wynn Banker  Encounter Date: 01/27/2017      OT End of Session - 01/27/17 1614    Visit Number 2   Number of Visits 24   Date for OT Re-Evaluation 04/14/17   Authorization Type Medicare G-Code 2 of 10   OT Start Time 1523   OT Stop Time 1600   OT Time Calculation (min) 37 min   Activity Tolerance Patient tolerated treatment well   Behavior During Therapy [facility] for tasks assessed/performed      Past Medical History:  Diagnosis Date  . Glaucoma   . Hepatitis C     Past Surgical History:  Procedure Laterality Date  . IR ANGIO INTRA EXTRACRAN SEL COM CAROTID INNOMINATE UNI L MOD SED  11/25/2016  . IR ANGIO VERTEBRAL SEL SUBCLAVIAN INNOMINATE UNI R MOD SED  11/25/2016  . IR ANGIO VERTEBRAL SEL VERTEBRAL UNI L MOD SED  11/25/2016  . IR INTRAVSC STENT CERV CAROTID W/O EMB-PROT MOD SED INC ANGIO  11/25/2016  . IR PERCUTANEOUS ART THROMBECTOMY/INFUSION INTRACRANIAL INC DIAG ANGIO  11/25/2016  . RADIOLOGY WITH ANESTHESIA N/A 11/25/2016   Procedure: RADIOLOGY WITH ANESTHESIA;  Surgeon: Radiologist, Medication, MD;  Location: [facility] OR;  Service: Radiology;  Laterality: N/A;    There were no vitals filed for this visit.      Subjective Assessment - 01/27/17 1612    Subjective  Pt. reports he tries to move his arm.   Patient is accompained by: Family member   Pertinent History Pt. is a 71 y.o. 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 Pt. was transferred to [facility]. He underwent CTA brain showed short segment near occlusion of proximal R-ICA with associated intraluminal thrombus --likely due to dissection and emergent large vessel occlusion with right M2 occlusion and evolving right frontal lobe  infarct.  He underwent cerebral angio with  complete revascularization of occluded superior division of R-MCA and near complete revascularization of proximal R-ICA with stent assisted angioplasty.  Stroke felt to be embolic in setting of ruptured plaque R-ICA and he was placed on ASA and brillinta due to stent. Pt. was transferred to inpatient rehab for several weeks, had home health therapy upon discharge, and now is ready for outpatient therapy services.   Limitations Dominant LUE functioning, left hand edema, distal ROM, coordination, and overal strength.   Patient Stated Goals To regain the use of his LUE.   Currently in Pain? No/denies      OT TREATMENT    Neuro muscular re-education:  Pt. worked on weightbearing in various positions for comfort.  Therapeutic Exercise:  Pt. worked on AROM for the LUE in all joint ranges. Pt. worked on forearm supination, elbow flexion, extension, wrist extension, digit MP, PIP, and DIP flexion, extension, and thumb abduction.  Manual Therapy:  Pt. worked on retrograde massage for edema control through his LUE, and hand. Pt. worked in supine, with his LUE elevated. Pt. tolerated soft tissue, and joint mobes including: carpal rolls, and metacarpal spread stretches.                             OT Education - 01/27/17 1613    Education provided Yes   Education Details  LUE functioning, edema control techniques   Person(s) Educated Patient   Methods Explanation   Comprehension Verbalized understanding;Returned demonstration;Tactile cues required;Need further instruction             OT Long Term Goals - 01/20/17 1741      OT LONG TERM GOAL #1   Title Pt. will improve edema by 2 cm through MPs in preparation for ROM.   Baseline wrist: 20 cm, MPs: 25.5   Time 12   Period Weeks   Status New   Target Date 04/14/17     OT LONG TERM GOAL #2   Title Pt. will improve Left hand digit extension in preparation for  weightbearing, and releasing objects during ADLs/IADLs.   Baseline Pt. is unable to achieve full digit extension on the left.   Time 12   Period Weeks   Status New   Target Date 04/14/17     OT LONG TERM GOAL #3   Title Pt. left hand grip with  increase by 5# in preparation for holding a cup.   Baseline Left 2#, RIght: 60   Time 12   Period Weeks   Status New   Target Date 04/14/17     OT LONG TERM GOAL #4   Title Pt. will increased right shoulder strength by 1 mm grade to assist with ADLs.   Baseline Impaired LUE strength   Time 12   Period Weeks   Status New   Target Date 04/14/17     OT LONG TERM GOAL #5   Title Pt. will increase left wrist extension in preparation for reaching ADL items.   Baseline wrist extension 28 degrees   Time 12   Period Weeks   Status New   Target Date 04/14/17     OT LONG TERM GOAL #6   Title Pt. will improve left hand coordination skills to be able to button clothing.   Baseline Pt. is unable   Time 12   Period Weeks   Status New   Target Date 04/14/17     OT LONG TERM GOAL #7   Title Pt. will independently demonstrate visual compensatory strategies duirng ADLs, and IADLs.   Baseline Glaucoma   Time 12   Period Weeks   Status New   Target Date 04/14/17               Plan - 01/27/17 1721    Clinical Impression Statement Pt. continues to present with increased edema through his left hand. Pt. continues to present with limited LUE distally, in the left hand, wrist, and digits. Pt. reports he tries to elevate his UE at home. Pt. continues to benefit from OT services to improve engagement in ADLs, and IADLs.   Occupational performance deficits (Please refer to evaluation for details): ADL's;IADL's   Rehab Potential Excellent   OT Frequency 2x / week   OT Duration 12 weeks   OT Treatment/Interventions Self-care/ADL training;Therapeutic exercise;Patient/family education;Energy conservation;Therapeutic activities;Manual Therapy;DME  and/or AE instruction;Therapeutic exercises;Moist Heat;Passive range of motion;Visual/perceptual remediation/compensation   Consulted and Agree with Plan of Care Patient      Patient will benefit from skilled therapeutic intervention in order to improve the following deficits and impairments:  Abnormal gait, Pain, Decreased strength, Decreased range of motion, Impaired UE functional use, Decreased coordination, Decreased knowledge of use of DME, Decreased activity tolerance, Difficulty walking, Decreased balance, Other (comment)  Visit Diagnosis: Muscle weakness (generalized)    Problem List Patient Active Problem List   Diagnosis Date  Noted  . Left hemiparesis (HCC)   . Essential hypertension 11/29/2016  . 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 Hepatitis C 07/22/2013    Olegario Messier, MS, OTR/L 01/27/2017, 5:30 PM  [facility] MAIN [facility] SERVICES [address] [place], [place], [address] Phone: [phone]   Fax:  [phone]  Name: Jason Nolan MRN: [id number] Date of Birth: 1945/06/21

## 2017-01-28 ENCOUNTER — Ambulatory Visit (INDEPENDENT_AMBULATORY_CARE_PROVIDER_SITE_OTHER): Payer: Medicare Other | Admitting: Internal Medicine

## 2017-01-28 ENCOUNTER — Encounter: Payer: Self-pay | Admitting: Internal Medicine

## 2017-01-28 DIAGNOSIS — I10 Essential (primary) hypertension: Secondary | ICD-10-CM | POA: Diagnosis not present

## 2017-01-28 DIAGNOSIS — I639 Cerebral infarction, unspecified: Secondary | ICD-10-CM

## 2017-01-28 NOTE — Progress Notes (Signed)
Subjective:    Patient ID: Jason Nolan, male    DOB: 02/16/1946, 71 y.o.   MRN: [id number]  HPI  Pt presents to the clinic today for 2 week follow up of HTN. At his last visit, his Amlodipine was increased to 10 mg daily. He has been taking the medication as prescribed, and denies adverse side effects. His BP today is 142/84. He reports he has been checking it at home, it has been running 120/70's.  Review of Systems      Past Medical History:  Diagnosis Date  . Glaucoma   . Hepatitis C       No Known Allergies  Family History  Problem Relation Age of Onset  . Hypertension Mother   . Hypertension Father     Social History   Social History  . Marital status: Married    Spouse name: N/A  . Number of children: N/A  . Years of education: N/A   Occupational History  . Not on file.   Social History Main Topics  . Smoking status: Former Games developer  . Smokeless tobacco: Former Neurosurgeon    Quit date: 05/06/1993  . Alcohol use 0.0 oz/week  . Drug use: No  . Sexual activity: Yes    Partners: Female    Birth control/ protection: None   Other Topics Concern  . Not on file   Social History Narrative  . No narrative on file     Constitutional: Pt reports intermittent headaches. Denies fever, malaise, fatigue, or abrupt weight changes.  Respiratory: Denies difficulty breathing, shortness of breath, cough or sputum production.   Cardiovascular: Denies chest pain, chest tightness, palpitations or swelling in the hands or feet.    No other specific complaints in a complete review of systems (except as listed in HPI above).  Objective:   Physical Exam  BP (!) 142/84   Pulse 62   Temp 98.2 F (36.8 C) (Oral)   Wt 197 lb (89.4 kg)   SpO2 97%   BMI 28.27 kg/m  Wt Readings from Last 3 Encounters:  01/28/17 197 lb (89.4 kg)  01/14/17 198 lb 8 oz (90 kg)  12/31/16 199 lb 8 oz (90.5 kg)    General: Appears his stated age, well developed, well nourished in  NAD. Cardiovascular: Normal rate and rhythm. S1,S2 noted.  No murmur, rubs or gallops noted.  Pulmonary/Chest: Normal effort and positive vesicular breath sounds. No respiratory distress. No wheezes, rales or ronchi noted.  Neurological: Alert and oriented.   BMET    Component Value Date/Time   NA 141 12/19/2016 0406   K 3.5 12/19/2016 0406   CL 108 12/19/2016 0406   CO2 26 12/19/2016 0406   GLUCOSE 100 (H) 12/19/2016 0406   BUN 13 12/19/2016 0406   CREATININE 0.94 12/19/2016 0406   CALCIUM 8.9 12/19/2016 0406   GFRNONAA >60 12/19/2016 0406   GFRAA >60 12/19/2016 0406    Lipid Panel     Component Value Date/Time   CHOL 193 11/26/2016 0420   TRIG 197 (H) 11/26/2016 0420   HDL 57 11/26/2016 0420   CHOLHDL 3.4 11/26/2016 0420   VLDL 39 11/26/2016 0420   LDLCALC 97 11/26/2016 0420    CBC    Component Value Date/Time   WBC 3.3 (L) 12/19/2016 0406   RBC 4.44 12/19/2016 0406   HGB 13.5 12/19/2016 0406   HCT 42.1 12/19/2016 0406   PLT 297 12/19/2016 0406   MCV 94.8 12/19/2016 0406   MCH 30.4 12/19/2016  0406   MCHC 32.1 12/19/2016 0406   RDW 13.8 12/19/2016 0406   LYMPHSABS 1.3 11/26/2016 0420   MONOABS 0.2 11/26/2016 0420   EOSABS 0.1 11/26/2016 0420   BASOSABS 0.0 11/26/2016 0420    Hgb A1C Lab Results  Component Value Date   HGBA1C 5.4 11/26/2016            Assessment & Plan:

## 2017-01-28 NOTE — Assessment & Plan Note (Signed)
No improvement since his last visit, but his numbers at home are < 130/80 Continue Amlodipine for now If his home numbers start to be sustained > 130/80, he will notify me  RTC in 4 months for your Medicare Wellness Exam

## 2017-01-28 NOTE — Patient Instructions (Signed)

## 2017-01-29 ENCOUNTER — Encounter: Payer: Self-pay | Admitting: Physical Therapy

## 2017-01-29 ENCOUNTER — Ambulatory Visit: Payer: Medicare Other | Admitting: Physical Therapy

## 2017-01-29 DIAGNOSIS — M6281 Muscle weakness (generalized): Secondary | ICD-10-CM | POA: Diagnosis not present

## 2017-01-29 DIAGNOSIS — R2681 Unsteadiness on feet: Secondary | ICD-10-CM | POA: Diagnosis not present

## 2017-01-29 DIAGNOSIS — R278 Other lack of coordination: Secondary | ICD-10-CM

## 2017-01-29 NOTE — Therapy (Signed)
[place] [facility] MAIN [facility] SERVICES [address] [place], [place], [address] Phone: [phone]   Fax:  [phone]  Physical Therapy Treatment  Patient Details  Name: Jason Nolan MRN: [id number] Date of Birth: May 30, 1945 Referring Provider: [name]  Encounter Date: 01/29/2017      PT End of Session - 01/29/17 1352    Visit Number 2   Number of Visits 25   Date for PT Re-Evaluation 04/21/17   Authorization Type g code 2/10   PT Start Time 0146   PT Stop Time 0230   PT Time Calculation (min) 44 min   Equipment Utilized During Treatment Gait belt   Activity Tolerance Patient tolerated treatment well   Behavior During Therapy [facility] for tasks assessed/performed      Past Medical History:  Diagnosis Date  . Glaucoma   . Hepatitis C     Past Surgical History:  Procedure Laterality Date  . IR ANGIO INTRA EXTRACRAN SEL COM CAROTID INNOMINATE UNI L MOD SED  11/25/2016  . IR ANGIO VERTEBRAL SEL SUBCLAVIAN INNOMINATE UNI R MOD SED  11/25/2016  . IR ANGIO VERTEBRAL SEL VERTEBRAL UNI L MOD SED  11/25/2016  . IR INTRAVSC STENT CERV CAROTID W/O EMB-PROT MOD SED INC ANGIO  11/25/2016  . IR PERCUTANEOUS ART THROMBECTOMY/INFUSION INTRACRANIAL INC DIAG ANGIO  11/25/2016  . RADIOLOGY WITH ANESTHESIA N/A 11/25/2016   Procedure: RADIOLOGY WITH ANESTHESIA;  Surgeon: Radiologist, Medication, MD;  Location: [facility] OR;  Service: Radiology;  Laterality: N/A;    There were no vitals filed for this visit.      Subjective Assessment - 01/29/17 1350    Subjective Pt states that he is feeling good overall today.  Notes that he has continued to perform HEP and his OTAGO program, denies increase of pain.  Pt had MD visit yesterday.  Edema noted still in L hand, no pitting present.  Pt states that he is icing and elevating his hand at home in the evening.   Pertinent History Pt. is a 71 y.o. 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 Pt.  was transferred to [facility]. He underwent CTA brain showed short segment near occlusion of proximal R-ICA with associated intraluminal thrombus --likely due to dissection and emergent large vessel occlusion with right M2 occlusion and evolving right frontal lobe infarct.  He underwent cerebral angio with  complete revascularization of occluded superior division of R-MCA and near complete revascularization of proximal R-ICA with stent assisted angioplasty.  Stroke felt to be embolic in setting of ruptured plaque R-ICA and he was placed on ASA and brillinta due to stent. Pt. was transferred to inpatient rehab for several weeks, had home health therapy upon discharge.   Limitations Walking;Lifting;House hold activities   How long can you sit comfortably? unlimited    How long can you stand comfortably? unlimited    How long can you walk comfortably? unsure   Patient Stated Goals "strengthen my whole L side"   Currently in Pain? No/denies      Treatment: Octane fitness x 8 min warmup   Toe taps to 6 in step x 20 B LE's with yellow pad on steps for visual cue for proper technique - no UE support  Lateral toe taps to 6 in step x 20 B LE's - no UE support  Fwd and backward walking over 1/2 foam roll standing on foam pads x 20 reps  Lateral stepping over 1/2 foam roll standing on  foam pads x 20 reps  Standing hip ABD with green theraband x 20 B LE's  Standing hip ext with green theraband x 20 B LE's   Heel raises x 30   Lateral walking in // bars with green theraband x 5 laps  Rocker board fwd/bwd, side to side with cues for weight shift and posture  TM walking 1.0  / mile / hour x 5 mins cues for posture and step height                         PT Education - 01/29/17 1351    Education provided Yes   Education Details Continuation of HEP and OTAGO program, ice and elevation to manage edema of L hand   Person(s) Educated Patient   Methods Explanation    Comprehension Verbalized understanding;Returned demonstration          PT Short Term Goals - 01/27/17 1704      PT SHORT TERM GOAL #1   Title Pt will improve 5x sit to stand to 18 seconds in order to demonstrate improved LE strength and improved dynamic balance.   Baseline 21 sec   Time 6   Period Weeks   Status New   Target Date 03/10/17     PT SHORT TERM GOAL #2   Title Pt will improve gait speed to at least 1.0 m/s with proper heel strike and decreased hip hike in order to demonstrate imrpoved LE strength and balance.   Baseline 0.83 m/s   Time 6   Period Weeks   Status New   Target Date 03/10/17     PT SHORT TERM GOAL #3   Title Pt will improve Berg Balance score to 51/56 in order to demonstrate improved dynamic and static balance, decreasing overall falls risk.   Baseline 48/56   Time 6   Period Weeks   Status New   Target Date 03/10/17           PT Long Term Goals - 01/27/17 1707      PT LONG TERM GOAL #1   Title Pt will demonstrate independence with HEP in order to continue LE strengthening and manage symptoms.   Time 12   Period Weeks   Status New   Target Date 04/21/17     PT LONG TERM GOAL #2   Title Pt will improve 5x sit to stand to <15 seconds in order to demonstrate improved LE strength and dynamic balance.   Baseline 21 sec   Time 12   Period Weeks   Status New   Target Date 03/10/17     PT LONG TERM GOAL #3   Title Pt will demonstrate gait speed of at least 1.2 m/s in order to demonstrate improved LE strength and improved balance in order to return to prior level of funciton.   Baseline 0.833 m/s   Time 12   Period Weeks   Status New   Target Date 03/10/17     PT LONG TERM GOAL #4   Title Pt will improve Berg Balance score to at least 54/56 in order demonstrate improved dynamic and static balance, decreasing overall falls risk.   Baseline 48/56   Time 12   Period Weeks   Status New   Target Date 03/10/17     PT LONG TERM GOAL #5    Title Pt will demonstrate improved LE strength to 4+/5 in all limited planes in order to return to prior  level of function.   Baseline gross strength: 4/5   Time 12   Period Weeks   Status New   Target Date 03/10/17     Additional Long Term Goals   Additional Long Term Goals Yes     PT LONG TERM GOAL #6   Title Pt will improve to >1500 feet without using AD in order to return to prior level of function, demonstrating improvements in LE strength, balance and endurance.   Baseline 1205 ft without AD   Time 12   Period Weeks   Status New   Target Date 03/10/17               Plan - 01/29/17 1425    Clinical Impression Statement Patient presents with decreased gait speed and decreased standing dynamic balance . Patient performed high level dynamic standing balance activiites with emphasis on weight shift and step height. He performed all activities with cGA and min cues. He will continue to benefit from skilled PT to improve mobility, gait , balance and quality of life.    Rehab Potential Good   Clinical Impairments Affecting Rehab Potential high prior level of function   PT Frequency 2x / week   PT Duration 12 weeks   PT Treatment/Interventions ADLs/Self Care Home Management;Cryotherapy;Moist Heat;Gait training;Stair training;Functional mobility training;Therapeutic activities;Therapeutic exercise;Balance training;Neuromuscular re-education;Patient/family education;Manual techniques   PT Next Visit Plan begin LE strength and balance exercises    PT Home Exercise Plan sidelying clamshells, supine bridges   Consulted and Agree with Plan of Care Patient      Patient will benefit from skilled therapeutic intervention in order to improve the following deficits and impairments:  Abnormal gait, Decreased balance, Decreased strength, Difficulty walking, Increased edema  Visit Diagnosis: Muscle weakness (generalized)  Unsteadiness on feet  Other lack of  coordination     Problem List Patient Active Problem List   Diagnosis Date Noted  . Left hemiparesis (HCC)   . Essential hypertension 11/29/2016  . 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 Hepatitis C 07/22/2013    Ezekiel Ina PT DPT 01/30/2017, 11:19 AM  [place] [facility] MAIN [facility] SERVICES [address] [place], [place], [address] Phone: [phone]   Fax:  [phone]  Name: Naasir Carreira MRN: [id number] Date of Birth: 1945/11/12

## 2017-01-30 ENCOUNTER — Ambulatory Visit: Payer: Medicare Other | Admitting: Occupational Therapy

## 2017-01-30 ENCOUNTER — Ambulatory Visit: Payer: Medicare Other | Admitting: Physical Therapy

## 2017-01-31 ENCOUNTER — Ambulatory Visit (HOSPITAL_BASED_OUTPATIENT_CLINIC_OR_DEPARTMENT_OTHER): Payer: Medicare Other | Admitting: Physical Medicine & Rehabilitation

## 2017-01-31 ENCOUNTER — Encounter: Payer: Medicare Other | Attending: Physical Medicine & Rehabilitation

## 2017-01-31 ENCOUNTER — Encounter: Payer: Self-pay | Admitting: Physical Medicine & Rehabilitation

## 2017-01-31 VITALS — BP 135/76 | HR 67 | Resp 14

## 2017-01-31 DIAGNOSIS — G8194 Hemiplegia, unspecified affecting left nondominant side: Secondary | ICD-10-CM | POA: Diagnosis not present

## 2017-01-31 DIAGNOSIS — R600 Localized edema: Secondary | ICD-10-CM | POA: Insufficient documentation

## 2017-01-31 DIAGNOSIS — Z87891 Personal history of nicotine dependence: Secondary | ICD-10-CM | POA: Diagnosis not present

## 2017-01-31 DIAGNOSIS — I69352 Hemiplegia and hemiparesis following cerebral infarction affecting left dominant side: Secondary | ICD-10-CM | POA: Diagnosis not present

## 2017-01-31 DIAGNOSIS — H409 Unspecified glaucoma: Secondary | ICD-10-CM | POA: Insufficient documentation

## 2017-01-31 DIAGNOSIS — I639 Cerebral infarction, unspecified: Secondary | ICD-10-CM | POA: Diagnosis not present

## 2017-01-31 NOTE — Progress Notes (Signed)
Subjective:    Patient ID: Jason Nolan, male    DOB: 1945/09/12, 71 y.o.   MRN: [id number] 71 y.o.left handed male with history of Hep C-treated with Harvoni, glaucoma who was admitted via [facility] on 7/23 with left sided weakness, facial droop and slurred speech due to early changes of R-MCA territory infarct. He underwent CTA brain showed short segment near occlusion of proximal R-ICA with associated intraluminal thrombus --likely due to dissection and emergent large vessel occlusion with right M2 occlusion and evolving right frontal lobe infarct. He underwent cerebral angio with complete revascularization of occluded superior division of R-MCA and near complete revascularization of proximal R-ICA with stent assisted angioplasty. Stroke felt to be embolic in setting of ruptured plaque R-ICA and he was placedon ASA and brillinta due to stent. Patient with resultant left sided weakness, expressive deficits with aphonia and dysphagia Admit date: 11/29/2016 Discharge date:12/21/2016 HPI  Left handed male who has completed inpatient rehabilitation for Left hemiparesis secondary to right MCA distribution infarct as well as completed Home health therapy  PCP started new BP med Headaches improving Left hand swelling somewhat better, Doing some retrograde massage. Trying to incorporate the left upper extremity into functional activities. Has started outpatient therapy, 4 visits thus far Patient is independent with his dressing and bathing   Pain Inventory Average Pain 2 Pain Right Now 0 My pain is no pain  In the last 24 hours, has pain interfered with the following? General activity 0 Relation with others 0 Enjoyment of life 0 What TIME of day is your pain at its worst? morning Sleep (in general) Good  Pain is worse with: no pain Pain improves with: no pain Relief from Meds: no pain  Mobility walk with assistance use a cane ability to climb steps?  yes do you drive?  yes Do you  have any goals in this area?  yes  Function retired  Neuro/Psych weakness trouble walking  Prior Studies Any changes since last visit?  no  Physicians involved in your care Any changes since last visit?  no   Family History  Problem Relation Age of Onset  . Hypertension Mother   . Hypertension Father    Social History   Social History  . Marital status: Married    Spouse name: N/A  . Number of children: N/A  . Years of education: N/A   Social History Main Topics  . Smoking status: Former Games developer  . Smokeless tobacco: Former Neurosurgeon    Quit date: 05/06/1993  . Alcohol use 0.0 oz/week  . Drug use: No  . Sexual activity: Yes    Partners: Female    Birth control/ protection: None   Other Topics Concern  . None   Social History Narrative  . None   Past Surgical History:  Procedure Laterality Date  . IR ANGIO INTRA EXTRACRAN SEL COM CAROTID INNOMINATE UNI L MOD SED  11/25/2016  . IR ANGIO VERTEBRAL SEL SUBCLAVIAN INNOMINATE UNI R MOD SED  11/25/2016  . IR ANGIO VERTEBRAL SEL VERTEBRAL UNI L MOD SED  11/25/2016  . IR INTRAVSC STENT CERV CAROTID W/O EMB-PROT MOD SED INC ANGIO  11/25/2016  . IR PERCUTANEOUS ART THROMBECTOMY/INFUSION INTRACRANIAL INC DIAG ANGIO  11/25/2016  . RADIOLOGY WITH ANESTHESIA N/A 11/25/2016   Procedure: RADIOLOGY WITH ANESTHESIA;  Surgeon: Radiologist, Medication, MD;  Location: [facility] OR;  Service: Radiology;  Laterality: N/A;   Past Medical History:  Diagnosis Date  . Glaucoma   . Hepatitis C  BP 135/76 (BP Location: Right Arm, Patient Position: Sitting, Cuff Size: Normal)   Pulse 67   Resp 14   SpO2 98%   Opioid Risk Score:   Fall Risk Score:  `1  Depression screen PHQ 2/9  Depression screen [facility] 2/9 12/31/2016 01/26/2016 10/27/2014 07/22/2013  Decreased Interest 0 0 0 0  Down, Depressed, Hopeless 0 0 0 0  PHQ - 2 Score 0 0 0 0  Altered sleeping 0 - - -  Tired, decreased energy 0 - - -  Change in appetite 0 - - -  Feeling bad or failure  about yourself  0 - - -  Trouble concentrating 0 - - -  Moving slowly or fidgety/restless 0 - - -  Suicidal thoughts 0 - - -  PHQ-9 Score 0 - - -  Difficult doing work/chores Not difficult at all - - -    Review of Systems  HENT: Negative.   Eyes: Negative.   Respiratory: Negative.   Cardiovascular: Negative.   Gastrointestinal: Negative.   Endocrine: Negative.   Genitourinary: Negative.   Musculoskeletal: Positive for gait problem.  Skin: Negative.   Allergic/Immunologic: Negative.   Neurological: Positive for weakness.  Hematological: Negative.   Psychiatric/Behavioral: Negative.        Objective:   Physical Exam  Constitutional: He is oriented to person, place, and time. He appears well-developed and well-nourished. No distress.  HENT:  Head: Normocephalic and atraumatic.  Eyes: Pupils are equal, round, and reactive to light. Conjunctivae and EOM are normal.  Neck: Normal range of motion.  Musculoskeletal:       Left hand: He exhibits decreased range of motion and swelling. He exhibits no tenderness. Decreased strength noted. He exhibits finger abduction, thumb/finger opposition and wrist extension trouble.  Dorsum of the hand edema graded as moderate  Neurological: He is alert and oriented to person, place, and time. A sensory deficit is present. He exhibits abnormal muscle tone. Gait normal.  he was able to ambulate without assistive device. No evidence of toe drag or knee instability. He is able to walk as well as heel walking as well as perform tandem gait, his gait velocity is only mildly diminished Motor strength. Left upper extremity. 3. At the finger flexors and extensors, 4 at the elbow flexors and extensors, and 4 at the deltoid. 5. At the left hip flexor, knee extensor and 4 at the left ankle dorsiflexor. Sensation diminished light touch in the left upper extremity  Skin: He is not diaphoretic.  Nursing note and vitals reviewed.   Line bisection test  normal. Clock face test normal Visual fields are intact confrontation testing Extraocular muscles are intact. Minimal nystagmus on lateral gaze.     Assessment & Plan:  1. Right MCA distribution infarct with primarily left upper extremity weakness and sensory loss. He is left-handed. There is no evidence of visual perceptual deficits. Given that he is left-handed, I would expect that he would have little in terms of left neglect, and this appears to be the case.  I do think he is able to return to driving in a graduated fashion as outlined below Graduated return to driving instructions were provided. It is recommended that the patient first drives with another licensed driver in an empty parking lot. If the patient does well with this, and they can drive on a quiet street with the licensed driver. If the patient does well with this they can drive on a busy street with a licensed driver. If  the patient does well with this, the next time out they can go by himself. For the first month after resuming driving, I recommend no nighttime or Interstate driving.   2. Dorsum of the hand edema, recommend removal of left wrist splint. Continue retrograde massage and outpatient occupational therapy.

## 2017-01-31 NOTE — Patient Instructions (Signed)

## 2017-02-03 ENCOUNTER — Ambulatory Visit: Payer: Medicare Other | Attending: Internal Medicine

## 2017-02-03 ENCOUNTER — Ambulatory Visit: Payer: Medicare Other | Admitting: Occupational Therapy

## 2017-02-03 DIAGNOSIS — R278 Other lack of coordination: Secondary | ICD-10-CM | POA: Diagnosis not present

## 2017-02-03 DIAGNOSIS — R471 Dysarthria and anarthria: Secondary | ICD-10-CM | POA: Insufficient documentation

## 2017-02-03 DIAGNOSIS — M6281 Muscle weakness (generalized): Secondary | ICD-10-CM

## 2017-02-03 DIAGNOSIS — R2681 Unsteadiness on feet: Secondary | ICD-10-CM | POA: Diagnosis not present

## 2017-02-03 DIAGNOSIS — I639 Cerebral infarction, unspecified: Secondary | ICD-10-CM | POA: Insufficient documentation

## 2017-02-03 NOTE — Therapy (Signed)
[place] [facility] MAIN [facility] SERVICES [address] [place], [place], [address] Phone: [phone]   Fax:  [phone]  Physical Therapy Treatment  Patient Details  Name: Jason Nolan MRN: [id number] Date of Birth: 25-Jun-1945 Referring Provider: [name]  Encounter Date: 02/03/2017      PT End of Session - 02/03/17 1352    Visit Number 3   Number of Visits 25   Date for PT Re-Evaluation 04/21/17   Authorization Type g code 3/10   PT Start Time 1345   PT Stop Time 1430   PT Time Calculation (min) 45 min   Equipment Utilized During Treatment Gait belt   Activity Tolerance Patient tolerated treatment well   Behavior During Therapy [facility] for tasks assessed/performed      Past Medical History:  Diagnosis Date  . Glaucoma   . Hepatitis C     Past Surgical History:  Procedure Laterality Date  . IR ANGIO INTRA EXTRACRAN SEL COM CAROTID INNOMINATE UNI L MOD SED  11/25/2016  . IR ANGIO VERTEBRAL SEL SUBCLAVIAN INNOMINATE UNI R MOD SED  11/25/2016  . IR ANGIO VERTEBRAL SEL VERTEBRAL UNI L MOD SED  11/25/2016  . IR INTRAVSC STENT CERV CAROTID W/O EMB-PROT MOD SED INC ANGIO  11/25/2016  . IR PERCUTANEOUS ART THROMBECTOMY/INFUSION INTRACRANIAL INC DIAG ANGIO  11/25/2016  . RADIOLOGY WITH ANESTHESIA N/A 11/25/2016   Procedure: RADIOLOGY WITH ANESTHESIA;  Surgeon: Radiologist, Medication, MD;  Location: [facility] OR;  Service: Radiology;  Laterality: N/A;    There were no vitals filed for this visit.      Subjective Assessment - 02/03/17 1351    Subjective Patient is having difficulty with his RUE. States he has a good weekend. Denies pain, Presented with [facility] and wife. no LOB or falls reported over the weekend.    Pertinent History Pt. is a 71 y.o. 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 Pt. was transferred to [facility]. He underwent CTA brain showed short segment near occlusion of proximal R-ICA with associated intraluminal  thrombus --likely due to dissection and emergent large vessel occlusion with right M2 occlusion and evolving right frontal lobe infarct.  He underwent cerebral angio with  complete revascularization of occluded superior division of R-MCA and near complete revascularization of proximal R-ICA with stent assisted angioplasty.  Stroke felt to be embolic in setting of ruptured plaque R-ICA and he was placed on ASA and brillinta due to stent. Pt. was transferred to inpatient rehab for several weeks, had home health therapy upon discharge.   Limitations Walking;Lifting;House hold activities   How long can you sit comfortably? unlimited    How long can you stand comfortably? unlimited    How long can you walk comfortably? unsure   Patient Stated Goals "strengthen my whole L side"   Currently in Pain? No/denies    Octane fitness 4 minutes  Neuro Re-ed  Horizontal head turns reading cards in hallway 200 ft, poor clearance of LLE, no LOB.   step over and back over half foam roller, no UE support, 12x each leg.  3 cone tap, unable to perform without single UE for balancing on LLE due to LLE weakness, 10x total, able to perform without UE support when on RLE.  RLE on basketball to promote weight shift to LLE, 90 seconds no LOB  TherEx Green theraband:  Extension 10x each leg  Flexion 10x each leg  Side step 10x each leg  Standing hamstring curls  15x each leg 15 squats with // bars: cues for depth of squat, pushing out with LE's cues for keeping bottom out and back  6 step up 15x each leg.    All interventions performed with CGA and min cueing for task sequencing  Pt. response to medical necessity:  Patient will continue to benefit from skilled physical therapy services to improve mobility, gait, balance, and quality of life.                           PT Education - 02/03/17 1352    Education provided Yes   Education Details Estate manager/land agent for functional movement    Person(s) Educated Patient   Methods Explanation;Demonstration;Verbal cues   Comprehension Verbalized understanding;Returned demonstration          PT Short Term Goals - 01/27/17 1704      PT SHORT TERM GOAL #1   Title Pt will improve 5x sit to stand to 18 seconds in order to demonstrate improved LE strength and improved dynamic balance.   Baseline 21 sec   Time 6   Period Weeks   Status New   Target Date 03/10/17     PT SHORT TERM GOAL #2   Title Pt will improve gait speed to at least 1.0 m/s with proper heel strike and decreased hip hike in order to demonstrate imrpoved LE strength and balance.   Baseline 0.83 m/s   Time 6   Period Weeks   Status New   Target Date 03/10/17     PT SHORT TERM GOAL #3   Title Pt will improve Berg Balance score to 51/56 in order to demonstrate improved dynamic and static balance, decreasing overall falls risk.   Baseline 48/56   Time 6   Period Weeks   Status New   Target Date 03/10/17           PT Long Term Goals - 01/27/17 1707      PT LONG TERM GOAL #1   Title Pt will demonstrate independence with HEP in order to continue LE strengthening and manage symptoms.   Time 12   Period Weeks   Status New   Target Date 04/21/17     PT LONG TERM GOAL #2   Title Pt will improve 5x sit to stand to <15 seconds in order to demonstrate improved LE strength and dynamic balance.   Baseline 21 sec   Time 12   Period Weeks   Status New   Target Date 03/10/17     PT LONG TERM GOAL #3   Title Pt will demonstrate gait speed of at least 1.2 m/s in order to demonstrate improved LE strength and improved balance in order to return to prior level of funciton.   Baseline 0.833 m/s   Time 12   Period Weeks   Status New   Target Date 03/10/17     PT LONG TERM GOAL #4   Title Pt will improve Berg Balance score to at least 54/56 in order demonstrate improved dynamic and static balance, decreasing overall falls risk.   Baseline 48/56   Time 12    Period Weeks   Status New   Target Date 03/10/17     PT LONG TERM GOAL #5   Title Pt will demonstrate improved LE strength to 4+/5 in all limited planes in order to return to prior level of function.   Baseline gross strength: 4/5   Time 12  Period Weeks   Status New   Target Date 03/10/17     Additional Long Term Goals   Additional Long Term Goals Yes     PT LONG TERM GOAL #6   Title Pt will improve to >1500 feet without using AD in order to return to prior level of function, demonstrating improvements in LE strength, balance and endurance.   Baseline 1205 ft without AD   Time 12   Period Weeks   Status New   Target Date 03/10/17               Plan - 02/03/17 1620    Clinical Impression Statement Patient is challenged by single limb stance due to weak LLE and fear of loss of balance. Balance and standing dynamic strengthening performed with CGA due to occasional L knee buckling from fatigue. Pt. Demonstrated ability to weight shift over LLE when RLE placed on unstable surface, however prefers to primarily weight bear though RLE when not cued. Patient will continue to benefit from skilled physical therapy services to improve mobility, gait, balance, and quality of life.    Rehab Potential Good   Clinical Impairments Affecting Rehab Potential high prior level of function   PT Frequency 2x / week   PT Duration 12 weeks   PT Treatment/Interventions ADLs/Self Care Home Management;Cryotherapy;Moist Heat;Gait training;Stair training;Functional mobility training;Therapeutic activities;Therapeutic exercise;Balance training;Neuromuscular re-education;Patient/family education;Manual techniques   PT Next Visit Plan begin LE strength and balance exercises , weight shift onto LLE.    PT Home Exercise Plan sidelying clamshells, supine bridges   Consulted and Agree with Plan of Care Patient      Patient will benefit from skilled therapeutic intervention in order to improve the  following deficits and impairments:  Abnormal gait, Decreased balance, Decreased strength, Difficulty walking, Increased edema  Visit Diagnosis: Muscle weakness (generalized)  Unsteadiness on feet  Other lack of coordination     Problem List Patient Active Problem List   Diagnosis Date Noted  . Left hemiparesis (HCC)   . Essential hypertension 11/29/2016  . 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 Hepatitis C 07/22/2013    Precious Bard, PT, DPT   Precious Bard 02/03/2017, 4:23 PM  [place] [facility] MAIN [facility] SERVICES [address] [place], [place], [address] Phone: [phone]   Fax:  [phone]  Name: Devesh Monforte MRN: [id number] Date of Birth: [birth year]

## 2017-02-04 NOTE — Therapy (Signed)
[place] MAIN [facility] SERVICES [address] [place], [place], [address] Phone: [phone]   Fax:  [phone]  Occupational Therapy Treatment  Patient Details  Name: Jason Nolan MRN: [id number] Date of Birth: 1945-08-14 Referring Provider: Dr. Wynn Banker  Encounter Date: 02/03/2017      OT End of Session - 02/04/17 0840    Visit Number 3   Number of Visits 24   Date for OT Re-Evaluation 04/14/17   Authorization Type Medicare G-Code 3 of 10   OT Start Time 1435   OT Stop Time 1515   OT Time Calculation (min) 40 min   Activity Tolerance Patient tolerated treatment well   Behavior During Therapy [facility] for tasks assessed/performed      Past Medical History:  Diagnosis Date  . Glaucoma   . Hepatitis C     Past Surgical History:  Procedure Laterality Date  . IR ANGIO INTRA EXTRACRAN SEL COM CAROTID INNOMINATE UNI L MOD SED  11/25/2016  . IR ANGIO VERTEBRAL SEL SUBCLAVIAN INNOMINATE UNI R MOD SED  11/25/2016  . IR ANGIO VERTEBRAL SEL VERTEBRAL UNI L MOD SED  11/25/2016  . IR INTRAVSC STENT CERV CAROTID W/O EMB-PROT MOD SED INC ANGIO  11/25/2016  . IR PERCUTANEOUS ART THROMBECTOMY/INFUSION INTRACRANIAL INC DIAG ANGIO  11/25/2016  . RADIOLOGY WITH ANESTHESIA N/A 11/25/2016   Procedure: RADIOLOGY WITH ANESTHESIA;  Surgeon: Radiologist, Medication, MD;  Location: [facility] OR;  Service: Radiology;  Laterality: N/A;    There were no vitals filed for this visit.      Subjective Assessment - 02/03/17 1757    Subjective  Pt. reporrts less swelling today.   Patient is accompained by: Family member   Pertinent History Pt. is a 71 y.o. 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 Pt. was transferred to [facility]. He underwent CTA brain showed short segment near occlusion of proximal R-ICA with associated intraluminal thrombus --likely due to dissection and emergent large vessel occlusion with right M2 occlusion and evolving right frontal lobe  infarct.  He underwent cerebral angio with  complete revascularization of occluded superior division of R-MCA and near complete revascularization of proximal R-ICA with stent assisted angioplasty.  Stroke felt to be embolic in setting of ruptured plaque R-ICA and he was placed on ASA and brillinta due to stent. Pt. was transferred to inpatient rehab for several weeks, had home health therapy upon discharge, and now is ready for outpatient therapy services.   Currently in Pain? No/denies        OT TREATMENT    Neuro muscular re-education:  Pt. worked on Social worker style discs, and combining movements of the UE including supinating forearm, extending wrist, and extending digits when stacking the discs. Pt. also worked on alternating multiple grasps with a lateral grasp, and grasping with the digit tips. Pt. also worked on reaching into shoulder flexion, and abduction to place the items into a bucket. Pt. Worked on alternating weightbearing in between coordination.  Manual Therapy:  Pt. worked on retrograde massage for edema control with the forearm elevated. Pt. Tolerated carpal rolls, and metacarpal spread stretches.                           OT Education - 02/03/17 1758    Education provided Yes   Education Details LUE edema control, and ROM   Person(s) Educated Patient   Methods Explanation;Demonstration;Verbal cues   Comprehension Verbalized understanding;Returned demonstration  OT Long Term Goals - 01/20/17 1741      OT LONG TERM GOAL #1   Title Pt. will improve edema by 2 cm through MPs in preparation for ROM.   Baseline wrist: 20 cm, MPs: 25.5   Time 12   Period Weeks   Status New   Target Date 04/14/17     OT LONG TERM GOAL #2   Title Pt. will improve Left hand digit extension in preparation for weightbearing, and releasing objects during ADLs/IADLs.   Baseline Pt. is unable to achieve full digit extension on the left.   Time  12   Period Weeks   Status New   Target Date 04/14/17     OT LONG TERM GOAL #3   Title Pt. left hand grip with  increase by 5# in preparation for holding a cup.   Baseline Left 2#, RIght: 60   Time 12   Period Weeks   Status New   Target Date 04/14/17     OT LONG TERM GOAL #4   Title Pt. will increased right shoulder strength by 1 mm grade to assist with ADLs.   Baseline Impaired LUE strength   Time 12   Period Weeks   Status New   Target Date 04/14/17     OT LONG TERM GOAL #5   Title Pt. will increase left wrist extension in preparation for reaching ADL items.   Baseline wrist extension 28 degrees   Time 12   Period Weeks   Status New   Target Date 04/14/17     OT LONG TERM GOAL #6   Title Pt. will improve left hand coordination skills to be able to button clothing.   Baseline Pt. is unable   Time 12   Period Weeks   Status New   Target Date 04/14/17     OT LONG TERM GOAL #7   Title Pt. will independently demonstrate visual compensatory strategies duirng ADLs, and IADLs.   Baseline Glaucoma   Time 12   Period Weeks   Status New   Target Date 04/14/17               Plan - 02/04/17 0841    Clinical Impression Statement Pt. continues to present with increased edema throughout his left hand, however it is less than last session. Pt. reports he is using his LUE more for IADL tasks at home. Pt. reports using his left hand for vacuuming. Pt. reports he may try to use it to assist with washing the floor. Pt. worked on repetitive UE, hand, and finger movements to increase motion, and assist in reducing swelling. Pt. continues to work on improving UE functioing for improved ADL, and IADl  functioning.    Occupational performance deficits (Please refer to evaluation for details): ADL's;IADL's   Rehab Potential Excellent   OT Frequency 2x / week   OT Duration 12 weeks   OT Treatment/Interventions Self-care/ADL training;Therapeutic exercise;Patient/family  education;Energy conservation;Therapeutic activities;Manual Therapy;DME and/or AE instruction;Therapeutic exercises;Moist Heat;Passive range of motion;Visual/perceptual remediation/compensation   Consulted and Agree with Plan of Care Patient      Patient will benefit from skilled therapeutic intervention in order to improve the following deficits and impairments:  Abnormal gait, Pain, Decreased strength, Decreased range of motion, Impaired UE functional use, Decreased coordination, Decreased knowledge of use of DME, Decreased activity tolerance, Difficulty walking, Decreased balance, Other (comment)  Visit Diagnosis: Muscle weakness (generalized)    Problem List Patient Active Problem List   Diagnosis Date  Noted  . Left hemiparesis (HCC)   . Essential hypertension 11/29/2016  . 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 Hepatitis C 07/22/2013    Olegario Messier, MS, OTR/L 02/04/2017, 8:52 AM  [facility] MAIN [facility] SERVICES [address] [place], [place], [address] Phone: [phone]   Fax:  [phone]  Name: Jason Nolan MRN: [id number] Date of Birth: [birth year]

## 2017-02-05 ENCOUNTER — Ambulatory Visit: Payer: Medicare Other | Admitting: Physical Therapy

## 2017-02-05 ENCOUNTER — Encounter: Payer: Self-pay | Admitting: Physical Therapy

## 2017-02-05 ENCOUNTER — Ambulatory Visit: Payer: Medicare Other | Admitting: Occupational Therapy

## 2017-02-05 DIAGNOSIS — R278 Other lack of coordination: Secondary | ICD-10-CM | POA: Diagnosis not present

## 2017-02-05 DIAGNOSIS — M6281 Muscle weakness (generalized): Secondary | ICD-10-CM | POA: Diagnosis not present

## 2017-02-05 DIAGNOSIS — R2681 Unsteadiness on feet: Secondary | ICD-10-CM

## 2017-02-05 DIAGNOSIS — I639 Cerebral infarction, unspecified: Secondary | ICD-10-CM | POA: Diagnosis not present

## 2017-02-05 DIAGNOSIS — R471 Dysarthria and anarthria: Secondary | ICD-10-CM | POA: Diagnosis not present

## 2017-02-05 NOTE — Therapy (Signed)
[place] [facility] MAIN [facility] SERVICES [address] [place], [place], [address] Phone: [phone]   Fax:  [phone]  Occupational Therapy Treatment  Patient Details  Name: Jason Nolan MRN: [id number] Date of Birth: [birth year] Referring Provider: Dr. Wynn Banker  Encounter Date: 02/05/2017      OT End of Session - 02/05/17 1712    Visit Number 4   Number of Visits 24   Date for OT Re-Evaluation 04/14/17   Authorization Type Medicare G-Code 4 of 10   OT Start Time 1430   OT Stop Time 1515   OT Time Calculation (min) 45 min   Activity Tolerance Patient tolerated treatment well   Behavior During Therapy [facility] for tasks assessed/performed      Past Medical History:  Diagnosis Date  . Glaucoma   . Hepatitis C     Past Surgical History:  Procedure Laterality Date  . IR ANGIO INTRA EXTRACRAN SEL COM CAROTID INNOMINATE UNI L MOD SED  11/25/2016  . IR ANGIO VERTEBRAL SEL SUBCLAVIAN INNOMINATE UNI R MOD SED  11/25/2016  . IR ANGIO VERTEBRAL SEL VERTEBRAL UNI L MOD SED  11/25/2016  . IR INTRAVSC STENT CERV CAROTID W/O EMB-PROT MOD SED INC ANGIO  11/25/2016  . IR PERCUTANEOUS ART THROMBECTOMY/INFUSION INTRACRANIAL INC DIAG ANGIO  11/25/2016  . RADIOLOGY WITH ANESTHESIA N/A 11/25/2016   Procedure: RADIOLOGY WITH ANESTHESIA;  Surgeon: Radiologist, Medication, MD;  Location: [facility] OR;  Service: Radiology;  Laterality: N/A;    There were no vitals filed for this visit.      Subjective Assessment - 02/05/17 1711    Subjective  Pt. reports less edema today.   Patient is accompained by: Family member   Pertinent History Pt. is a 71 y.o. 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 Pt. was transferred to [facility]. He underwent CTA brain showed short segment near occlusion of proximal R-ICA with associated intraluminal thrombus --likely due to dissection and emergent large vessel occlusion with right M2 occlusion and evolving right frontal lobe infarct.   He underwent cerebral angio with  complete revascularization of occluded superior division of R-MCA and near complete revascularization of proximal R-ICA with stent assisted angioplasty.  Stroke felt to be embolic in setting of ruptured plaque R-ICA and he was placed on ASA and brillinta due to stent. Pt. was transferred to inpatient rehab for several weeks, had home health therapy upon discharge, and now is ready for outpatient therapy services.   Limitations Dominant LUE functioning, left hand edema, distal ROM, coordination, and overal strength.   Patient Stated Goals To regain the use of his LUE.   Currently in Pain? No/denies   Pain Score 0-No pain      OT TREATMENT    Neuro muscular re-education:  Pt. worked on weightbearing through his right hand at the tabletop in preparation for ROM, and functional hand use.  Therapeutic Exercise:  Pt. worked on yellow theraputty ex, for gross gripping, and lateral pinching. Pt. education was provided about hand position during the exercises.  Manual Therapy:  Pt. worked on retrograde massage for edema control in the left hand.                            OT Education - 02/05/17 1711    Education provided Yes   Education Details LUE edema control   Person(s) Educated Patient   Methods Explanation   Comprehension Verbalized understanding;Returned demonstration  OT Long Term Goals - 01/20/17 1741      OT LONG TERM GOAL #1   Title Pt. will improve edema by 2 cm through MPs in preparation for ROM.   Baseline wrist: 20 cm, MPs: 25.5   Time 12   Period Weeks   Status New   Target Date 04/14/17     OT LONG TERM GOAL #2   Title Pt. will improve Left hand digit extension in preparation for weightbearing, and releasing objects during ADLs/IADLs.   Baseline Pt. is unable to achieve full digit extension on the left.   Time 12   Period Weeks   Status New   Target Date 04/14/17     OT LONG TERM GOAL #3    Title Pt. left hand grip with  increase by 5# in preparation for holding a cup.   Baseline Left 2#, RIght: 60   Time 12   Period Weeks   Status New   Target Date 04/14/17     OT LONG TERM GOAL #4   Title Pt. will increased right shoulder strength by 1 mm grade to assist with ADLs.   Baseline Impaired LUE strength   Time 12   Period Weeks   Status New   Target Date 04/14/17     OT LONG TERM GOAL #5   Title Pt. will increase left wrist extension in preparation for reaching ADL items.   Baseline wrist extension 28 degrees   Time 12   Period Weeks   Status New   Target Date 04/14/17     OT LONG TERM GOAL #6   Title Pt. will improve left hand coordination skills to be able to button clothing.   Baseline Pt. is unable   Time 12   Period Weeks   Status New   Target Date 04/14/17     OT LONG TERM GOAL #7   Title Pt. will independently demonstrate visual compensatory strategies duirng ADLs, and IADLs.   Baseline Glaucoma   Time 12   Period Weeks   Status New   Target Date 04/14/17               Plan - 02/05/17 1713    Clinical Impression Statement Pt. continues to present with increased edema in the LUE. Pt. is responding well to retrograde massage. Pt. continues to present with LUE pain, and weakness overall. Pt. continues to benefit from skilled OT services to improve edema in the Left hand. and improve LUE functioning for use during ADLs, and IADLs.   Occupational performance deficits (Please refer to evaluation for details): ADL's;IADL's   Rehab Potential Excellent   OT Frequency 2x / week   OT Duration 12 weeks   OT Treatment/Interventions Self-care/ADL training;Therapeutic exercise;Patient/family education;Energy conservation;Therapeutic activities;Manual Therapy;DME and/or AE instruction;Therapeutic exercises;Moist Heat;Passive range of motion;Visual/perceptual remediation/compensation   Consulted and Agree with Plan of Care Patient      Patient will  benefit from skilled therapeutic intervention in order to improve the following deficits and impairments:  Abnormal gait, Pain, Decreased strength, Decreased range of motion, Impaired UE functional use, Decreased coordination, Decreased knowledge of use of DME, Decreased activity tolerance, Difficulty walking, Decreased balance, Other (comment)  Visit Diagnosis: Muscle weakness (generalized)    Problem List Patient Active Problem List   Diagnosis Date Noted  . Left hemiparesis (HCC)   . Essential hypertension 11/29/2016  . Acute ischemic stroke (HCC) - R MCA embolic stroke in setting of ruptured R ICA plaque w dissection, s/p R  MCA stent and thrombectomy 11/25/2016  . Hepatitis C 07/22/2013    Olegario Messier, MS, OTR/L 02/05/2017, 5:22 PM  [facility] MAIN [facility] SERVICES [address] [place], [place], [address] Phone: [phone]   Fax:  [phone]  Name: Danish Ruffins MRN: [id number] Date of Birth: 1945-12-10

## 2017-02-05 NOTE — Therapy (Signed)
[place] [facility] MAIN [facility] SERVICES [address] [place], [place], [address] Phone: [phone]   Fax:  [phone]  Physical Therapy Treatment  Patient Details  Name: Jason Nolan MRN: [id number] Date of Birth: 1945/09/28 Referring Provider: [name]  Encounter Date: 02/05/2017      PT End of Session - 02/05/17 1350    Visit Number 4   Number of Visits 25   Date for PT Re-Evaluation 04/21/17   Authorization Type g code 4/10   PT Start Time 0145   PT Stop Time 0230   PT Time Calculation (min) 45 min   Equipment Utilized During Treatment Gait belt   Activity Tolerance Patient tolerated treatment well   Behavior During Therapy [facility] for tasks assessed/performed      Past Medical History:  Diagnosis Date  . Glaucoma   . Hepatitis C     Past Surgical History:  Procedure Laterality Date  . IR ANGIO INTRA EXTRACRAN SEL COM CAROTID INNOMINATE UNI L MOD SED  11/25/2016  . IR ANGIO VERTEBRAL SEL SUBCLAVIAN INNOMINATE UNI R MOD SED  11/25/2016  . IR ANGIO VERTEBRAL SEL VERTEBRAL UNI L MOD SED  11/25/2016  . IR INTRAVSC STENT CERV CAROTID W/O EMB-PROT MOD SED INC ANGIO  11/25/2016  . IR PERCUTANEOUS ART THROMBECTOMY/INFUSION INTRACRANIAL INC DIAG ANGIO  11/25/2016  . RADIOLOGY WITH ANESTHESIA N/A 11/25/2016   Procedure: RADIOLOGY WITH ANESTHESIA;  Surgeon: Radiologist, Medication, MD;  Location: [facility] OR;  Service: Radiology;  Laterality: N/A;    There were no vitals filed for this visit.      Subjective Assessment - 02/05/17 1347    Subjective Pt states that overall he is feeling good today, denis pain or soreness.  He states that he is having more problems with his L hand due to swelling, states that he has been elevating it and iceing as discussed in earlier sessions.    Pertinent History Pt. is a 71 y.o. 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 Pt. was transferred to [facility]. He underwent CTA brain showed  short segment near occlusion of proximal R-ICA with associated intraluminal thrombus --likely due to dissection and emergent large vessel occlusion with right M2 occlusion and evolving right frontal lobe infarct.  He underwent cerebral angio with  complete revascularization of occluded superior division of R-MCA and near complete revascularization of proximal R-ICA with stent assisted angioplasty.  Stroke felt to be embolic in setting of ruptured plaque R-ICA and he was placed on ASA and brillinta due to stent. Pt. was transferred to inpatient rehab for several weeks, had home health therapy upon discharge.   Limitations Walking;Lifting;House hold activities   How long can you sit comfortably? unlimited    How long can you stand comfortably? unlimited    How long can you walk comfortably? unsure   Patient Stated Goals "strengthen my whole L side"   Currently in Pain? No/denies   Pain Score 0-No pain     Treatment: NuStep x 5 min warmup  Walking in hallway reading cards x 2 with SBA, no LOB noted, decreased heel strike on L LE  Treadmill x 4 min at 1.0 mph with focus on L heel strike; decreased heel strike and toe drag noted x 4  Toe taps to 6 in step from foam pad x 30 B LE's   Lateral toe taps to 6 in step from foam pad x 30 B LE's  Lateral step over 1/2 foam  from foam pads x 20 reps without UE support  Green theraband:  Hip ext x 20 B LE's  Hip ABD x 20 B LE's  Hip flex x 20 B LE's   Single leg squat to raised mat table 2 x 5 B LE without UE support; decreased stability on L LE  Forward BOSU lunges x 15 B LE without UE support - cues for form and exercise progression; decreased stability with weight bearing through L LE  Squats in // bars x 20 - cues for proper hip hinge into squat   Weighted walkouts 12.5lbs backwards x 5 reps                               PT Education - 02/05/17 1349    Education provided Yes   Education Details ice and elevation to  control L hand swelling, HEP continuation    Person(s) Educated Patient   Methods Explanation   Comprehension Verbalized understanding;Returned demonstration          PT Short Term Goals - 01/27/17 1704      PT SHORT TERM GOAL #1   Title Pt will improve 5x sit to stand to 18 seconds in order to demonstrate improved LE strength and improved dynamic balance.   Baseline 21 sec   Time 6   Period Weeks   Status New   Target Date 03/10/17     PT SHORT TERM GOAL #2   Title Pt will improve gait speed to at least 1.0 m/s with proper heel strike and decreased hip hike in order to demonstrate imrpoved LE strength and balance.   Baseline 0.83 m/s   Time 6   Period Weeks   Status New   Target Date 03/10/17     PT SHORT TERM GOAL #3   Title Pt will improve Berg Balance score to 51/56 in order to demonstrate improved dynamic and static balance, decreasing overall falls risk.   Baseline 48/56   Time 6   Period Weeks   Status New   Target Date 03/10/17           PT Long Term Goals - 01/27/17 1707      PT LONG TERM GOAL #1   Title Pt will demonstrate independence with HEP in order to continue LE strengthening and manage symptoms.   Time 12   Period Weeks   Status New   Target Date 04/21/17     PT LONG TERM GOAL #2   Title Pt will improve 5x sit to stand to <15 seconds in order to demonstrate improved LE strength and dynamic balance.   Baseline 21 sec   Time 12   Period Weeks   Status New   Target Date 03/10/17     PT LONG TERM GOAL #3   Title Pt will demonstrate gait speed of at least 1.2 m/s in order to demonstrate improved LE strength and improved balance in order to return to prior level of funciton.   Baseline 0.833 m/s   Time 12   Period Weeks   Status New   Target Date 03/10/17     PT LONG TERM GOAL #4   Title Pt will improve Berg Balance score to at least 54/56 in order demonstrate improved dynamic and static balance, decreasing overall falls risk.   Baseline  48/56   Time 12   Period Weeks   Status New   Target Date 03/10/17  PT LONG TERM GOAL #5   Title Pt will demonstrate improved LE strength to 4+/5 in all limited planes in order to return to prior level of function.   Baseline gross strength: 4/5   Time 12   Period Weeks   Status New   Target Date 03/10/17     Additional Long Term Goals   Additional Long Term Goals Yes     PT LONG TERM GOAL #6   Title Pt will improve to >1500 feet without using AD in order to return to prior level of function, demonstrating improvements in LE strength, balance and endurance.   Baseline 1205 ft without AD   Time 12   Period Weeks   Status New   Target Date 03/10/17               Plan - 02/05/17 1430    Clinical Impression Statement Pt continues to progress well through all strength and balance activities, without increase of pain or discomfort.  Pt was able to complete gait activities today with no LOB and SBA, but continues to demonstrate improper L heel strike at times.  Pt was able to progress single limb activities today, with decreased stability noted when standing on L LE compared to R LE.  Pt was able to weight bear evenly through LE's with decreased cueing today.  Pt would continue to benefit from skilled therapy serivces in order to improve single limb stability, balance, gait and LE strength in order to imrpove funcitonal mobility and decrase risk of falls.   Rehab Potential Good   Clinical Impairments Affecting Rehab Potential high prior level of function   PT Frequency 2x / week   PT Duration 12 weeks   PT Treatment/Interventions ADLs/Self Care Home Management;Cryotherapy;Moist Heat;Gait training;Stair training;Functional mobility training;Therapeutic activities;Therapeutic exercise;Balance training;Neuromuscular re-education;Patient/family education;Manual techniques   PT Next Visit Plan dynamic balance activities with agility ladder, single limb activities    PT Home  Exercise Plan Add green theraband to HEP exercises   Consulted and Agree with Plan of Care Patient      Patient will benefit from skilled therapeutic intervention in order to improve the following deficits and impairments:  Abnormal gait, Decreased balance, Decreased strength, Difficulty walking, Increased edema  Visit Diagnosis: Muscle weakness (generalized)  Unsteadiness on feet     Problem List Patient Active Problem List   Diagnosis Date Noted  . Left hemiparesis (HCC)   . Essential hypertension 11/29/2016  . 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 Hepatitis C 07/22/2013   This entire session was performed under direct supervision and direction of a licensed therapist/therapist assistant . I have personally read, edited and approve of the note as written. Stacey Drain, SPT Ezekiel Ina, PT, DPT  02/05/2017, 2:33 PM  [facility] MAIN [facility] SERVICES [address] [place], [place], [address] Phone: [phone]   Fax:  [phone]  Name: Agustus Mane MRN: [id number] Date of Birth: Jul 31, 1945

## 2017-02-07 ENCOUNTER — Other Ambulatory Visit: Payer: Self-pay

## 2017-02-07 DIAGNOSIS — I10 Essential (primary) hypertension: Secondary | ICD-10-CM

## 2017-02-07 DIAGNOSIS — I639 Cerebral infarction, unspecified: Secondary | ICD-10-CM

## 2017-02-07 MED ORDER — AMLODIPINE BESYLATE 10 MG PO TABS: 10.0000 mg | ORAL_TABLET | Freq: Every day | ORAL | 0 refills | Status: DC

## 2017-02-07 MED ORDER — ATORVASTATIN CALCIUM 40 MG PO TABS: 40.0000 mg | ORAL_TABLET | Freq: Every day | ORAL | 0 refills | Status: DC

## 2017-02-07 MED ORDER — TICAGRELOR 90 MG PO TABS: 90.0000 mg | ORAL_TABLET | Freq: Two times a day (BID) | ORAL | 0 refills | Status: DC

## 2017-02-07 NOTE — Telephone Encounter (Signed)
Pt request refill amlodipine, atorvastatin [facility]inta [facility]. Refilled 90 days each and pt has CPX scheduled on 05/28/17. Refilled per protocol and pt voiced understanding.

## 2017-02-10 ENCOUNTER — Ambulatory Visit (INDEPENDENT_AMBULATORY_CARE_PROVIDER_SITE_OTHER): Payer: Medicare Other | Admitting: Neurology

## 2017-02-10 ENCOUNTER — Encounter: Payer: Self-pay | Admitting: Neurology

## 2017-02-10 ENCOUNTER — Other Ambulatory Visit: Payer: Self-pay | Admitting: Internal Medicine

## 2017-02-10 VITALS — BP 134/79 | HR 59 | Wt 200.0 lb

## 2017-02-10 DIAGNOSIS — I69354 Hemiplegia and hemiparesis following cerebral infarction affecting left non-dominant side: Secondary | ICD-10-CM | POA: Diagnosis not present

## 2017-02-10 DIAGNOSIS — I639 Cerebral infarction, unspecified: Secondary | ICD-10-CM | POA: Diagnosis not present

## 2017-02-10 DIAGNOSIS — I10 Essential (primary) hypertension: Secondary | ICD-10-CM

## 2017-02-10 NOTE — Patient Instructions (Signed)
I had a long d/w patient about his recent stroke,lcarotid dissection,eft hemiplegia risk for recurrent stroke/TIAs, personally independently reviewed imaging studies and stroke evaluation results and answered questions.Continue  aspirin 81 mg and Brilinta 90 mg twice daily given recent carotid stent for 6 months and then aspirin alone for secondary stroke prevention and maintain strict control of hypertension with blood pressure goal below 130/90, diabetes with hemoglobin A1c goal below 6.5% and lipids with LDL cholesterol goal below 70 mg/dL. I also advised the patient to eat a healthy diet with plenty of whole grains, cereals, fruits and vegetables, exercise regularly and maintain ideal body weight. I have advised him to use a cane while walking at all times and fall and safety prevention precautions. Consider possible participation in the previous stroke trial if interested. Followup in the future with my nurse practitioner in 6 months or call earlier if necessary Stroke Prevention Some medical conditions and behaviors are associated with an increased chance of having a stroke. You may prevent a stroke by making healthy choices and managing medical conditions. How can I reduce my risk of having a stroke?  Stay physically active. Get at least 30 minutes of activity on most or all days.  Do not smoke. It may also be helpful to avoid exposure to secondhand smoke.  Limit alcohol use. Moderate alcohol use is considered to be: ? No more than 2 drinks per day for men. ? No more than 1 drink per day for nonpregnant women.  Eat healthy foods. This involves: ? Eating 5 or more servings of fruits and vegetables a day. ? Making dietary changes that address high blood pressure (hypertension), high cholesterol, diabetes, or obesity.  Manage your cholesterol levels. ? Making food choices that are high in fiber and low in saturated fat, trans fat, and cholesterol may control cholesterol levels. ? Take any  prescribed medicines to control cholesterol as directed by your health care provider.  Manage your diabetes. ? Controlling your carbohydrate and sugar intake is recommended to manage diabetes. ? Take any prescribed medicines to control diabetes as directed by your health care provider.  Control your hypertension. ? Making food choices that are low in salt (sodium), saturated fat, trans fat, and cholesterol is recommended to manage hypertension. ? Ask your health care provider if you need treatment to lower your blood pressure. Take any prescribed medicines to control hypertension as directed by your health care provider. ? If you are 71-11 years of age, have your blood pressure checked every 3-5 years. If you are 25 years of age or older, have your blood pressure checked every year.  Maintain a healthy weight. ? Reducing calorie intake and making food choices that are low in sodium, saturated fat, trans fat, and cholesterol are recommended to manage weight.  Stop drug abuse.  Avoid taking birth control pills. ? Talk to your health care provider about the risks of taking birth control pills if you are over 63 years old, smoke, get migraines, or have ever had a blood clot.  Get evaluated for sleep disorders (sleep apnea). ? Talk to your health care provider about getting a sleep evaluation if you snore a lot or have excessive sleepiness.  Take medicines only as directed by your health care provider. ? For some people, aspirin or blood thinners (anticoagulants) are helpful in reducing the risk of forming abnormal blood clots that can lead to stroke. If you have the irregular heart rhythm of atrial fibrillation, you should be on a blood  thinner unless there is a good reason you cannot take them. ? Understand all your medicine instructions.  Make sure that other conditions (such as anemia or atherosclerosis) are addressed. Get help right away if:  You have sudden weakness or numbness of the  face, arm, or leg, especially on one side of the body.  Your face or eyelid droops to one side.  You have sudden confusion.  You have trouble speaking (aphasia) or understanding.  You have sudden trouble seeing in one or both eyes.  You have sudden trouble walking.  You have dizziness.  You have a loss of balance or coordination.  You have a sudden, severe headache with no known cause.  You have new chest pain or an irregular heartbeat. Any of these symptoms may represent a serious problem that is an emergency. Do not wait to see if the symptoms will go away. Get medical help at once. Call your local emergency services (911 in [place] Do not drive yourself to the hospital. This information is not intended to replace advice given to you by your health care provider. Make sure you discuss any questions you have with your health care provider. Document Released: 05/30/2004 Document Revised: 09/28/2015 Document Reviewed: 10/23/2012 Elsevier Interactive Patient Education  2017 [employer]  Carotid Artery Dissection Carotid artery dissection is a tear in a carotid artery. The carotid arteries are blood vessels on each side of the neck. They carry blood from the heart to the brain and other parts of the head. When an artery tears, blood collects inside the layers of the artery wall. This can cause a blood clot. This condition increases the risk of stroke if it is not diagnosed and treated right away. What are the causes? For many people, the cause of this condition is not known. It may be caused by:  A neck injury due to sudden or excessive neck movement. This is called a traumatic dissection.  Having weak blood vessel walls. The walls may tear even when no injury occurs (spontaneous dissection).  An angiogram procedure. This is a rare cause of carotid artery dissection.  What increases the risk? Carotid artery dissection is more likely to develop in people who are 65-30 years old.  You may be more likely to have a spontaneous carotid artery dissection if you:  Have high blood pressure.  Have a migraine disorder.  Have certain inherited diseases or connective tissue disorders that weaken the blood vessels.  Have fibromuscular dysplasia.  Abuse amphetamine drugs.  Have had an artery dissection in the past.  What are the signs or symptoms? Symptoms of carotid artery dissection are similar to signs of a stroke. Symptoms may include:  Headache.  Face or neck pain.  Changes in vision.  Weakness on one side of the face or body.  Drooping eyelid.  Loss of taste.  Ringing in the ear.  How is this diagnosed? This condition is diagnosed with tests, such as:  CT angiogram. This test uses a computer to take X-rays of your carotid arteries. A dye may be injected into your blood to show the inside of your blood vessels more clearly.  MRI angiogram. This is a type of MRI that is used to evaluate the blood vessels.  Cerebral angiogram. This test uses X-rays and a dye to show the blood vessels in the brain and neck.  Doppler ultrasound. This test uses sound waves to show the carotid arteries. The test will also show how well blood flows through your  arteries.  How is this treated? Treatment for this condition depends on the cause of your carotid artery dissection and your overall health. The most important goal of treatment is to prevent a stroke. If you are having a stroke, it is important to get treatment quickly. Treatment may include:  Blood thinners. This medicine helps to prevent blood clots. This may be given first through an IV tube, and then as pills for 3-6 months.  Procedures to widen a narrow blood vessel (angioplasty) or to place a mesh tube (stent) inside the blood vessel to keep it open.  Surgery to repair the area. This is rarely needed.  Follow these instructions at home:  Work with your health care provider to control your blood pressure.  This may involve: ? Exercising regularly, as told by your health care provider. Check with your health care provider before starting any new type of exercise. ? Eating a heart-healthy diet. This includes limiting unhealthy fats and eating more healthy fats. ? Reducing the amount of salt (sodium) that you eat to less than 1,500 mg a day. ? Reducing stress by participating in things that you enjoy and avoiding things that cause you stress.  Avoid activities that put you at risk for neck injuries, such as contact sports.  Take over-the-counter and prescription medicines only as told by your health care provider.  Do not use any tobacco products, such as cigarettes, chewing tobacco, and e-cigarettes. If you need help quitting, ask your health care provider.  Keep all follow-up visits as told by your health care provider. This is important. Get help right away if:  You feel weak or dizzy.  You have a sudden, severe headache with no known cause.  You notice changes in your vision or speech.  You have difficulty breathing.  You have a loss of balance or coordination.  You have numbness in your face, arm, or leg.  You have chest pain.  You have a fever. These symptoms may represent a serious problem that is an emergency. Do not wait to see if the symptoms will go away. Get medical help right away. Call your local emergency services (911 in the [place] Do not drive yourself to the hospital. This information is not intended to replace advice given to you by your health care provider. Make sure you discuss any questions you have with your health care provider. Document Released: 07/15/2011 Document Revised: 10/03/2015 Document Reviewed: 07/19/2015 Elsevier Interactive Patient Education  [employer].

## 2017-02-10 NOTE — Progress Notes (Signed)
[facility] [address] [place]. [place] [address] [phone]       OFFICE FOLLOW-UP NOTE  [name] Date of Birth:  [birth year] Medical Record Number:  [id number]   HPI: [name] is a pleasant 71 year old African-American male seen today for the first office follow-up visit following hospital admission for stroke in July 2018. He is accompanied by his wife. History is obtained from them as well as review of hospital medical records and have personally reviewed imaging films. Jason Nolan a 71 y.o.left-handed malewho has a past medical history of hepatitis C treated with Harvoni, who presented with Left-sided weakness, slurred speech, left facial droop. He was in his usual state of health and last normal at 2 AM when he went to bed, woke up on the morning of 7/23/2018around 9 AM with left-sided weakness, left facial droop and slurred speech. 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 His NIH stroke scale at that hospital was recorded to be 6. Noncontrast head CT showed early changes of ischemia in the right MCA territory and a hyperdense right MCA. He was transferred over to [facility] for evaluation for possible thrombectomy. He was not given IV TPA because she was outside of the TPA window at the time of presentation. The patient is now s/p thrombectomy and revascularization of superior division of R MCA with T1C1 reperfusion.  LKW: 0200 on 11/25/2016. Modified Rankin Score:0 Patient was not administered IV t-PA secondary to arriving outside of the treatment widnow. Hewas admitted to the neuro ICU for further evaluation and treatment. The patient underwent emergent proximal right ICA angioplasty with stenting followed by mechanical thrombectomy of the right MCA clot using solitaire device with good recanalization. Patient did clinically well and was following commands. He  was extubated and did well. He had mild left facial droop and significant left arm weakness but he was able to move his left leg against gravity. He was seen by physical occupational speech therapy and felt to be a good patient for inpatient rehabilitation. He was placed on aspirin and brillinta due to his fresh carotid stent and transferred to rehabilitation in stable condition. He has been home now versus several weeks from rehabilitation. He has finished outpatient therapy. Is able to walk independently with a cane. He has only minimal dragging of the left leg. He's up in improvement in his left hand weakness as well. He still has significant weakness of left grip and hand muscles. He is able to bend his fingers and has a weak grip. He is tolerating aspirin and Brilinta without bruising or bleeding. He has started driving recently. She is not been able to play golf or no to the gym. He has an appointment to see Dr. Corliss Skains later this week. He complains of mild headaches off and on but this also seem to be getting better. He is tolerating Lipitor well without muscle aches and pains. He states his blood pressure is well controlled and today it is 134/79.  ROS:   14 system review of systems is positive for  hand weakness, gait difficulty, headache, ringing in the ears and all other systems negative  PMH:  Past Medical History:  Diagnosis Date  . Glaucoma   . Hepatitis C   . Stroke [facility])     Social History:  Social History   Social History  . Marital status: Married    Spouse name: N/A  .  Number of children: N/A  . Years of education: N/A   Occupational History  . Not on file.   Social History Main Topics  . Smoking status: Former Games developer  . Smokeless tobacco: Never Used  . Alcohol use No  . Drug use: No  . Sexual activity: Yes    Partners: Female    Birth control/ protection: None   Other Topics Concern  . Not on file   Social History Narrative  . No narrative on file     Medications:   Current Outpatient Prescriptions on File Prior to Visit  Medication Sig Dispense Refill  . amLODipine (NORVASC) 10 MG tablet Take 1 tablet (10 mg total) by mouth daily. 90 tablet 0  . aspirin 81 MG chewable tablet Chew 1 tablet (81 mg total) by mouth daily.    Marland Kitchen atorvastatin (LIPITOR) 40 MG tablet Take 1 tablet (40 mg total) by mouth daily at 6 PM. 90 tablet 0  . PAZEO 0.7 % SOLN Place 1 drop into both eyes daily.     . ticagrelor (BRILINTA) 90 MG TABS tablet Take 1 tablet (90 mg total) by mouth 2 (two) times daily. 180 tablet 0  . timolol (TIMOPTIC) 0.5 % ophthalmic solution Place 1 drop into the right eye daily.     Marland Kitchen VYZULTA 0.024 % SOLN Apply 1 drop to eye at bedtime.      No current facility-administered medications on file prior to visit.     Allergies:  No Known Allergies  Physical Exam General: well developed, well nourished Middle-aged African-American male, seated, in no evident distress Head: head normocephalic and atraumatic.  Neck: supple with no carotid or supraclavicular bruits Cardiovascular: regular rate and rhythm, no murmurs Musculoskeletal: no deformity Skin:  no rash/petichiae Vascular:  Normal pulses all extremities Vitals:   02/10/17 1309  BP: 134/79  Pulse: (!) 59   Neurologic Exam Mental Status: Awake and fully alert. Oriented to place and time. Recent and remote memory intact. Attention span, concentration and fund of knowledge appropriate. Mood and affect appropriate.  Cranial Nerves: Fundoscopic exam reveals sharp disc margins. Pupils equal, briskly reactive to light. Extraocular movements full without nystagmus. Visual fields full to confrontation. Hearing intact. Facial sensation intact. Mild left lower facial weakness. tongue, palate moves normally and symmetrically.  Motor: Normal bulk and tone. Normal strength in all tested extremity muscles Except left hand and grip weakness with diminished fine finger movements on the left.  Orbits right over left approximately. Mild left ankle dorsiflexor weakness. Tone increased on the left compared to the right side. Sensory.: intact to touch ,pinprick .position and vibratory sensation.  Coordination: Rapid alternating movements normal in all extremities. Finger-to-nose and heel-to-shin performed accurately bilaterally. Gait and Station: Arises from chair without difficulty. Stance is normal. Gait demonstrates normal stride length and balance . Able to heel, toe and tandem walk without difficulty.  Reflexes: 1+ and symmetric. Toes downgoing.   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 Mild residual left hemiparesis. Vascular risk factors of hypertension, hyperlipidemia and carotid dissection.    PLAN: I had a long d/w patient about his recent stroke,lcarotid dissection,eft hemiplegia risk for recurrent stroke/TIAs, personally independently reviewed imaging studies and stroke evaluation results and answered questions.Continue  aspirin 81 mg and Brilinta 90 mg twice daily given recent  carotid stent for 6 months and then aspirin alone for secondary stroke prevention and maintain strict control of hypertension with blood pressure goal below 130/90, diabetes with hemoglobin A1c goal below 6.5% and lipids with LDL cholesterol goal below 70 mg/dL. I also advised the patient to eat a healthy diet with plenty of whole grains, cereals, fruits and vegetables, exercise regularly and maintain ideal body weight. I have advised him to use a cane while walking at all times and fall and safety prevention precautions. Consider possible participation in the [facility] stroke trial if interested. Followup in the future with my nurse practitioner in 6 months or call earlier if  necessary Greater than 50% of time during this 25 minute visit was spent on counseling,explanation of diagnosis of carotid dissection, embolic stroke, planning of further management, discussion with patient and family and coordination of care Delia Heady, MD  [facility] [address] [place], [place] [address]  Phone [phone] Fax [phone] Note: This document was prepared with digital dictation and possible smart phrase technology. Any transcriptional errors that result from this process are unintentional

## 2017-02-11 ENCOUNTER — Ambulatory Visit: Payer: Medicare Other | Admitting: Speech Pathology

## 2017-02-11 ENCOUNTER — Encounter: Payer: Self-pay | Admitting: Physical Therapy

## 2017-02-11 ENCOUNTER — Ambulatory Visit: Payer: Medicare Other | Admitting: Physical Therapy

## 2017-02-11 DIAGNOSIS — R471 Dysarthria and anarthria: Secondary | ICD-10-CM | POA: Diagnosis not present

## 2017-02-11 DIAGNOSIS — M6281 Muscle weakness (generalized): Secondary | ICD-10-CM | POA: Diagnosis not present

## 2017-02-11 DIAGNOSIS — R278 Other lack of coordination: Secondary | ICD-10-CM | POA: Diagnosis not present

## 2017-02-11 DIAGNOSIS — R2681 Unsteadiness on feet: Secondary | ICD-10-CM | POA: Diagnosis not present

## 2017-02-11 DIAGNOSIS — I639 Cerebral infarction, unspecified: Secondary | ICD-10-CM | POA: Diagnosis not present

## 2017-02-11 NOTE — Therapy (Signed)
[facility] MAIN [facility] SERVICES [address] [place], [place], [address] Phone: [phone]   Fax:  [phone]  Physical Therapy Treatment  Patient Details  Name: Jason Nolan MRN: [id number] Date of Birth: 02-17-1946 Referring Provider: [name]  Encounter Date: 02/11/2017      PT End of Session - 02/11/17 1647    Visit Number 5   Number of Visits 25   Date for PT Re-Evaluation 04/21/17   Authorization Type g code 5/10   PT Start Time 1631   PT Stop Time 1715   PT Time Calculation (min) 44 min   Equipment Utilized During Treatment Gait belt   Activity Tolerance Patient tolerated treatment well   Behavior During Therapy [facility] for tasks assessed/performed      Past Medical History:  Diagnosis Date  . Glaucoma   . Hepatitis C   . Stroke [facility])     Past Surgical History:  Procedure Laterality Date  . IR ANGIO INTRA EXTRACRAN SEL COM CAROTID INNOMINATE UNI L MOD SED  11/25/2016  . IR ANGIO VERTEBRAL SEL SUBCLAVIAN INNOMINATE UNI R MOD SED  11/25/2016  . IR ANGIO VERTEBRAL SEL VERTEBRAL UNI L MOD SED  11/25/2016  . IR INTRAVSC STENT CERV CAROTID W/O EMB-PROT MOD SED INC ANGIO  11/25/2016  . IR PERCUTANEOUS ART THROMBECTOMY/INFUSION INTRACRANIAL INC DIAG ANGIO  11/25/2016  . RADIOLOGY WITH ANESTHESIA N/A 11/25/2016   Procedure: RADIOLOGY WITH ANESTHESIA;  Surgeon: Radiologist, Medication, MD;  Location: [facility] OR;  Service: Radiology;  Laterality: N/A;    There were no vitals filed for this visit.      Subjective Assessment - 02/11/17 1721    Subjective Pt reports that he feels good today; denies pain or soreness.  He states that he continues to consistently perform HEP daily.   Pertinent History Pt. is a 71 y.o. 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 Pt. was transferred to [facility]. He underwent CTA brain showed short segment near occlusion of proximal R-ICA with associated intraluminal thrombus  --likely due to dissection and emergent large vessel occlusion with right M2 occlusion and evolving right frontal lobe infarct.  He underwent cerebral angio with  complete revascularization of occluded superior division of R-MCA and near complete revascularization of proximal R-ICA with stent assisted angioplasty.  Stroke felt to be embolic in setting of ruptured plaque R-ICA and he was placed on ASA and brillinta due to stent. Pt. was transferred to inpatient rehab for several weeks, had home health therapy upon discharge.   Limitations Walking;Lifting;House hold activities   How long can you sit comfortably? unlimited    How long can you stand comfortably? unlimited    How long can you walk comfortably? unsure   Patient Stated Goals "strengthen my whole L side"   Currently in Pain? No/denies   Pain Score 0-No pain     Treatment:  Octane fitness x 6 min warmup   Lateral walking in // bars with green theraband x 5 laps   Monster walks with green theraband x 5 laps in // bars     Green theraband:   Hip flex x 20 B LE   Hip ABD x 20 B LE   Hip Ext x 20 B LE   Squats in // bars - chair behind for tactile cues to reach full depth: 10 x 2   Single limb squats 10 x 2 on B LE's - cues for form and proper foot  placement   Fwd lunges into BOSU x 20 B LE's - cues for technique    Lateral lunges into BOSU x 20 B LE's - visual cues for proper exercise technique   Core rotation standing on foam beam with narrow stance x 20 with cards as visual cues for reach target   Diagonal core rotation on foam beam in narrow stance with cards as visual targets x 20 each way (high low reach)                          PT Education - 02/11/17 1648    Education provided Yes   Education Details HEP progression using therbands   Person(s) Educated Patient   Methods Explanation;Demonstration   Comprehension Verbalized understanding;Returned demonstration          PT Short Term Goals  - 01/27/17 1704      PT SHORT TERM GOAL #1   Title Pt will improve 5x sit to stand to 18 seconds in order to demonstrate improved LE strength and improved dynamic balance.   Baseline 21 sec   Time 6   Period Weeks   Status New   Target Date 03/10/17     PT SHORT TERM GOAL #2   Title Pt will improve gait speed to at least 1.0 m/s with proper heel strike and decreased hip hike in order to demonstrate imrpoved LE strength and balance.   Baseline 0.83 m/s   Time 6   Period Weeks   Status New   Target Date 03/10/17     PT SHORT TERM GOAL #3   Title Pt will improve Berg Balance score to 51/56 in order to demonstrate improved dynamic and static balance, decreasing overall falls risk.   Baseline 48/56   Time 6   Period Weeks   Status New   Target Date 03/10/17           PT Long Term Goals - 01/27/17 1707      PT LONG TERM GOAL #1   Title Pt will demonstrate independence with HEP in order to continue LE strengthening and manage symptoms.   Time 12   Period Weeks   Status New   Target Date 04/21/17     PT LONG TERM GOAL #2   Title Pt will improve 5x sit to stand to <15 seconds in order to demonstrate improved LE strength and dynamic balance.   Baseline 21 sec   Time 12   Period Weeks   Status New   Target Date 03/10/17     PT LONG TERM GOAL #3   Title Pt will demonstrate gait speed of at least 1.2 m/s in order to demonstrate improved LE strength and improved balance in order to return to prior level of funciton.   Baseline 0.833 m/s   Time 12   Period Weeks   Status New   Target Date 03/10/17     PT LONG TERM GOAL #4   Title Pt will improve Berg Balance score to at least 54/56 in order demonstrate improved dynamic and static balance, decreasing overall falls risk.   Baseline 48/56   Time 12   Period Weeks   Status New   Target Date 03/10/17     PT LONG TERM GOAL #5   Title Pt will demonstrate improved LE strength to 4+/5 in all limited planes in order to return  to prior level of function.   Baseline gross strength: 4/5  Time 12   Period Weeks   Status New   Target Date 03/10/17     Additional Long Term Goals   Additional Long Term Goals Yes     PT LONG TERM GOAL #6   Title Pt will improve to >1500 feet without using AD in order to return to prior level of function, demonstrating improvements in LE strength, balance and endurance.   Baseline 1205 ft without AD   Time 12   Period Weeks   Status New   Target Date 03/10/17               Plan - 02/11/17 1717    Clinical Impression Statement Pt continues to show improvements in ambulation, balance and LE strength.  Pt was able to complete all exercises today with minimal fatigue and no LOB.  Pt demonstrates improved postural reactions when moving outside BOS during dynamic balance activities.  Pt has improved gait pattern, with decreased foot drop noted with activities today on L LE.  Pt continues to show decreased strength and stability of L LE compared to R LE, especially with single limb activities.  Pt would continue to benefit from skilled PT services in order to continue strengthening LE's, improve dynamic balance, and single limb stability in order to further improve gait, decrease falls risk and improve functional mobility.   Rehab Potential Good   Clinical Impairments Affecting Rehab Potential high prior level of function   PT Frequency 2x / week   PT Duration 12 weeks   PT Treatment/Interventions ADLs/Self Care Home Management;Cryotherapy;Moist Heat;Gait training;Stair training;Functional mobility training;Therapeutic activities;Therapeutic exercise;Balance training;Neuromuscular re-education;Patient/family education;Manual techniques   PT Next Visit Plan dynamic balance activities with agility ladder, single limb activities    PT Home Exercise Plan Add green theraband to HEP exercises   Consulted and Agree with Plan of Care Patient      Patient will benefit from skilled  therapeutic intervention in order to improve the following deficits and impairments:  Abnormal gait, Decreased balance, Decreased strength, Difficulty walking, Increased edema  Visit Diagnosis: Muscle weakness (generalized)  Unsteadiness on feet     Problem List Patient Active Problem List   Diagnosis Date Noted  . Left hemiparesis (HCC)   . Essential hypertension 11/29/2016  . 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 Hepatitis C 07/22/2013   This entire session was performed under direct supervision and direction of a licensed therapist/therapist assistant . I have personally read, edited and approve of the note as written. Stacey Drain, SPT Ezekiel Ina, PT, DPT  02/11/2017, 5:22 PM  [facility]: [facility] MAIN [facility] SERVICES [address] [place], [place], [address] Phone: [phone]   Fax:  [phone]  Name: Jason Nolan MRN: [id number] Date of Birth: 1945-07-03

## 2017-02-12 ENCOUNTER — Encounter: Payer: Self-pay | Admitting: Speech Pathology

## 2017-02-12 NOTE — Therapy (Signed)
[place] [facility] MAIN [facility] SERVICES [address] [place], [place], [address] Phone: [phone]   Fax:  [phone]  Speech Language Pathology Evaluation  Patient Details  Name: Jason Nolan MRN: [id number] Date of Birth: 02-21-1946 Referring Provider: [name]   Encounter Date: 02/11/2017      End of Session - 02/12/17 0925    Visit Number 1   Number of Visits 7   Date for SLP Re-Evaluation 03/25/17   SLP Start Time 1530   SLP Stop Time  1615   SLP Time Calculation (min) 45 min   Activity Tolerance Patient tolerated treatment well      Past Medical History:  Diagnosis Date  . Glaucoma   . Hepatitis C   . Stroke [facility])     Past Surgical History:  Procedure Laterality Date  . IR ANGIO INTRA EXTRACRAN SEL COM CAROTID INNOMINATE UNI L MOD SED  11/25/2016  . IR ANGIO VERTEBRAL SEL SUBCLAVIAN INNOMINATE UNI R MOD SED  11/25/2016  . IR ANGIO VERTEBRAL SEL VERTEBRAL UNI L MOD SED  11/25/2016  . IR INTRAVSC STENT CERV CAROTID W/O EMB-PROT MOD SED INC ANGIO  11/25/2016  . IR PERCUTANEOUS ART THROMBECTOMY/INFUSION INTRACRANIAL INC DIAG ANGIO  11/25/2016  . RADIOLOGY WITH ANESTHESIA N/A 11/25/2016   Procedure: RADIOLOGY WITH ANESTHESIA;  Surgeon: Radiologist, Medication, MD;  Location: [facility] OR;  Service: Radiology;  Laterality: N/A;    There were no vitals filed for this visit.          SLP Evaluation [facility] - 02/12/17 0001      SLP Visit Information   SLP Received On 02/11/17   Referring Provider Nicki Reaper W    Onset Date 11/25/2016   Medical Diagnosis Right MCA CVA     Subjective   Subjective The patient states that he has become more slurred since discharge from inpatient rehab.  He did not get home health speech and thinks that his speech has deteriorated due to this.     Patient/Family Stated Goal Normalize speech     Pain Assessment   Currently in Pain? No/denies     General Information   HPI 71 year old man, S/P right  MCA CVA on 11/25/2016.  The patient received inpatient rehab (including SLP) at [facility] 11/29/2016 - 12/21/2016.  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 He is able express himself and speech is 90% intelligible with supervision needed to self-monitor and correct errors.      Prior Functional Status   Cognitive/Linguistic Baseline Within functional limits     Oral Motor/Sensory Function   Overall Oral Motor/Sensory Function Impaired   Labial ROM Reduced left   Labial Symmetry Abnormal symmetry left   Labial Strength Reduced Left   Labial Sensation Reduced Left   Labial Coordination Reduced   Lingual ROM Within Functional Limits   Lingual Symmetry Within Functional Limits   Lingual Strength Reduced   Lingual Sensation Within Functional Limits   Lingual Coordination Reduced   Facial ROM Reduced left   Facial Symmetry Left droop   Facial Strength Reduced   Facial Sensation Reduced   Facial Coordination Reduced   Velum Within Functional Limits   Mandible Within Functional Limits   Overall Oral Motor/Sensory Function Mild     Motor Speech   Overall Motor Speech Impaired   Respiration Within functional limits   Phonation Normal   Resonance Hypernasality  Articulation Impaired   Level of Impairment Sentence   Intelligibility Intelligible   Motor Planning Witnin functional limits   Motor Speech Errors Aware   Effective Techniques Slow rate;Increased vocal intensity;Over-articulate   Phonation [facility]                         SLP Education - 03-08-17 [phone]    Education provided Yes   Education Details Results and recommendations regarding evaluation and general goals   Person(s) Educated Patient   Methods Explanation   Comprehension Verbalized understanding            SLP Long Term Goals - 03/08/2017 0929      SLP LONG TERM GOAL #1   Title Pt will  improve oral motor strength and coordination for speech by completing speech based oral motor exercises independently.   Time 6   Period Weeks   Status New   Target Date 03/25/17     SLP LONG TERM GOAL #2   Title Pt will improve speech intelligibility for multi-syllabic words by controlling rate of speech, over-articulation, and increased loudness to achieve 80% intelligibility with min. SLP cues.   Time 6   Period Weeks   Status New   Target Date 03/25/17     SLP LONG TERM GOAL #3   Title Pt will improve speech intelligibility and prosody in response to moderately complex cognitive linguistic tasks by controlling rate of speech, over-articulation, and increased loudness to achieve 80% or better intelligibility.   Time 6   Period Weeks   Status New   Target Date 03/25/17          Plan - March 08, 2017 9604    Clinical Impression Statement At 2  months post onset of right MCA CVA, the patient is presenting with mild dysarthria characterized by imprecise articulation at a rapid rate.  His speech is intelligible at a slowed rate. The patient states that he has become more slurred since discharge from inpatient rehab.  He did not get home health speech and thinks that his speech has deteriorated due to this.   The patient will benefit from short term outpatient speech therapy to develop a home exercise program to improve speech clarity and insure maintenance of skills.    Speech Therapy Frequency 1x /week   Duration Other (comment)  6 weeks   Potential to Achieve Goals Good   Potential Considerations Ability to learn/carryover information;Co-morbidities;Cooperation/participation level;Medical prognosis;Pain level;Previous level of function;Severity of impairments;Family/community support   SLP Home Exercise Plan TBA   Consulted and Agree with Plan of Care Patient      Patient will benefit from skilled therapeutic intervention in order to improve the following deficits and impairments:    Dysarthria and anarthria - Plan: SLP plan of care cert/re-cert      G-Codes - 03/08/2017 0931    Functional Assessment Tool Used clinical judgment   Functional Limitations Motor speech   Motor Speech Current Status [phone]) At least 20 percent but less than 40 percent impaired, limited or restricted   Motor Speech Goal Status (J1914) At least 1 percent but less than 20 percent impaired, limited or restricted      Problem List Patient Active Problem List   Diagnosis Date Noted  . Left hemiparesis (HCC)   . Essential hypertension 11/29/2016  . 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  Hepatitis C 07/22/2013   Dollene Primrose, MS/CCC- SLP  Leandrew Koyanagi 02/12/2017, 9:34 AM  [place] [facility] MAIN [facility] SERVICES [address] [place], [place], [address] Phone: [phone]   Fax:  [phone]  Name: Jason Nolan MRN: [id number] Date of Birth: [birth year]

## 2017-02-13 ENCOUNTER — Ambulatory Visit (HOSPITAL_COMMUNITY)
Admission: RE | Admit: 2017-02-13 | Discharge: 2017-02-13 | Disposition: A | Discharge status: Home / Self Care | Payer: Medicare Other | Source: Ambulatory Visit | Attending: Interventional Radiology | Admitting: Interventional Radiology

## 2017-02-13 ENCOUNTER — Encounter: Payer: Self-pay | Admitting: Physical Therapy

## 2017-02-13 ENCOUNTER — Ambulatory Visit: Payer: Medicare Other | Admitting: Physical Therapy

## 2017-02-13 DIAGNOSIS — I639 Cerebral infarction, unspecified: Secondary | ICD-10-CM

## 2017-02-13 DIAGNOSIS — I10 Essential (primary) hypertension: Secondary | ICD-10-CM | POA: Diagnosis not present

## 2017-02-13 DIAGNOSIS — R471 Dysarthria and anarthria: Secondary | ICD-10-CM | POA: Diagnosis not present

## 2017-02-13 DIAGNOSIS — R2681 Unsteadiness on feet: Secondary | ICD-10-CM | POA: Diagnosis not present

## 2017-02-13 DIAGNOSIS — I63411 Cerebral infarction due to embolism of right middle cerebral artery: Secondary | ICD-10-CM | POA: Diagnosis not present

## 2017-02-13 DIAGNOSIS — R278 Other lack of coordination: Secondary | ICD-10-CM | POA: Diagnosis not present

## 2017-02-13 DIAGNOSIS — M6281 Muscle weakness (generalized): Secondary | ICD-10-CM

## 2017-02-13 HISTORY — PX: IR RADIOLOGIST EVAL & MGMT: IMG5224

## 2017-02-13 NOTE — Therapy (Signed)
[facility] MAIN [facility] SERVICES [address] [place], [place], [address] Phone: [phone]   Fax:  [phone]  Physical Therapy Treatment  Patient Details  Name: Jason Nolan MRN: [id number] Date of Birth: Jan 16, 1946 Referring Provider: [name]  Encounter Date: 02/13/2017      PT End of Session - 02/13/17 1557    Visit Number 6   Number of Visits 25   Date for PT Re-Evaluation 04/21/17   Authorization Type g code 6/10   PT Start Time 1555   PT Stop Time 1640   PT Time Calculation (min) 45 min   Equipment Utilized During Treatment Gait belt   Activity Tolerance Patient tolerated treatment well   Behavior During Therapy [facility] for tasks assessed/performed      Past Medical History:  Diagnosis Date  . Glaucoma   . Hepatitis C   . Stroke [facility])     Past Surgical History:  Procedure Laterality Date  . IR ANGIO INTRA EXTRACRAN SEL COM CAROTID INNOMINATE UNI L MOD SED  11/25/2016  . IR ANGIO VERTEBRAL SEL SUBCLAVIAN INNOMINATE UNI R MOD SED  11/25/2016  . IR ANGIO VERTEBRAL SEL VERTEBRAL UNI L MOD SED  11/25/2016  . IR INTRAVSC STENT CERV CAROTID W/O EMB-PROT MOD SED INC ANGIO  11/25/2016  . IR PERCUTANEOUS ART THROMBECTOMY/INFUSION INTRACRANIAL INC DIAG ANGIO  11/25/2016  . RADIOLOGY WITH ANESTHESIA N/A 11/25/2016   Procedure: RADIOLOGY WITH ANESTHESIA;  Surgeon: Radiologist, Medication, MD;  Location: [facility] OR;  Service: Radiology;  Laterality: N/A;    There were no vitals filed for this visit.      Subjective Assessment - 02/13/17 1600    Subjective Pt reports that he is having a good day today; denies pain or soreness.  Continues to perform HEP daily.  He states that his biggest issue is continued swelling of L hand.   Pertinent History Pt. is a 71 y.o. 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 Pt. was transferred to [facility]. He underwent CTA brain showed short segment near occlusion of proximal  R-ICA with associated intraluminal thrombus --likely due to dissection and emergent large vessel occlusion with right M2 occlusion and evolving right frontal lobe infarct.  He underwent cerebral angio with  complete revascularization of occluded superior division of R-MCA and near complete revascularization of proximal R-ICA with stent assisted angioplasty.  Stroke felt to be embolic in setting of ruptured plaque R-ICA and he was placed on ASA and brillinta due to stent. Pt. was transferred to inpatient rehab for several weeks, had home health therapy upon discharge.   Limitations Walking;Lifting;House hold activities   How long can you sit comfortably? unlimited    How long can you stand comfortably? unlimited    How long can you walk comfortably? unsure   Patient Stated Goals "strengthen my whole L side"   Currently in Pain? No/denies   Pain Score 0-No pain   Multiple Pain Sites No     Treatment:  Octane fitness x 5 min for warmup   Hip strengthening with green theraband:   ABD x 30 B LE's   Ext x 30 B LE's   Flex x 30 B LE's    Lateral walks x 5 laps in // bars   Monster walks x 5 laps in // bars   Marching on BOSU x 20 B LE's - finger touch on // bars for support     Fwd lunges into BOSU x  30 B LE's    Lateral lunges into BOSU x 20 B LE's     Resisted gait with 12.5 lbs x 5 reps backwards walking, 3 reps lateral walking, 3 reps forward walking - minor LOB noted with lateral walking, able to self-correct using postural reactions  Pt required visual and verbal cues for proper technique and form with exercise and exercise progression.                              PT Education - 02/13/17 1601    Education provided Yes   Education Details Use of L hand to decrease swelling; continue to perform HEP   Person(s) Educated Patient   Methods Explanation   Comprehension Verbalized understanding;Returned demonstration          PT Short Term Goals - 01/27/17  1704      PT SHORT TERM GOAL #1   Title Pt will improve 5x sit to stand to 18 seconds in order to demonstrate improved LE strength and improved dynamic balance.   Baseline 21 sec   Time 6   Period Weeks   Status New   Target Date 03/10/17     PT SHORT TERM GOAL #2   Title Pt will improve gait speed to at least 1.0 m/s with proper heel strike and decreased hip hike in order to demonstrate imrpoved LE strength and balance.   Baseline 0.83 m/s   Time 6   Period Weeks   Status New   Target Date 03/10/17     PT SHORT TERM GOAL #3   Title Pt will improve Berg Balance score to 51/56 in order to demonstrate improved dynamic and static balance, decreasing overall falls risk.   Baseline 48/56   Time 6   Period Weeks   Status New   Target Date 03/10/17           PT Long Term Goals - 01/27/17 1707      PT LONG TERM GOAL #1   Title Pt will demonstrate independence with HEP in order to continue LE strengthening and manage symptoms.   Time 12   Period Weeks   Status New   Target Date 04/21/17     PT LONG TERM GOAL #2   Title Pt will improve 5x sit to stand to <15 seconds in order to demonstrate improved LE strength and dynamic balance.   Baseline 21 sec   Time 12   Period Weeks   Status New   Target Date 03/10/17     PT LONG TERM GOAL #3   Title Pt will demonstrate gait speed of at least 1.2 m/s in order to demonstrate improved LE strength and improved balance in order to return to prior level of funciton.   Baseline 0.833 m/s   Time 12   Period Weeks   Status New   Target Date 03/10/17     PT LONG TERM GOAL #4   Title Pt will improve Berg Balance score to at least 54/56 in order demonstrate improved dynamic and static balance, decreasing overall falls risk.   Baseline 48/56   Time 12   Period Weeks   Status New   Target Date 03/10/17     PT LONG TERM GOAL #5   Title Pt will demonstrate improved LE strength to 4+/5 in all limited planes in order to return to prior  level of function.   Baseline gross strength: 4/5  Time 12   Period Weeks   Status New   Target Date 03/10/17     Additional Long Term Goals   Additional Long Term Goals Yes     PT LONG TERM GOAL #6   Title Pt will improve to >1500 feet without using AD in order to return to prior level of function, demonstrating improvements in LE strength, balance and endurance.   Baseline 1205 ft without AD   Time 12   Period Weeks   Status New   Target Date 03/10/17               Plan - 02/13/17 1627    Clinical Impression Statement LE strength and stability exercises were a focus during today's session, utilizing both open kinetic and closed kinetic chain activities.  Pt shows improvements with single limb stability, with deficits still noted greater on L LE compared to R LE.  Pt presents with improved gait pattern, noting less L foot drop and decreased toe drag during ambulation from one activity to the next.  Pt would continue to benefit from skilled services in order to address further LE strength and stability deficits in order to decrease falls risk, improve functional mobility.      Rehab Potential Good   Clinical Impairments Affecting Rehab Potential high prior level of function   PT Frequency 2x / week   PT Duration 12 weeks   PT Treatment/Interventions ADLs/Self Care Home Management;Cryotherapy;Moist Heat;Gait training;Stair training;Functional mobility training;Therapeutic activities;Therapeutic exercise;Balance training;Neuromuscular re-education;Patient/family education;Manual techniques   PT Next Visit Plan dynamic balance activities with agility ladder, single limb activities    PT Home Exercise Plan Add green theraband to HEP exercises   Consulted and Agree with Plan of Care Patient      Patient will benefit from skilled therapeutic intervention in order to improve the following deficits and impairments:  Abnormal gait, Decreased balance, Decreased strength,  Difficulty walking, Increased edema  Visit Diagnosis: Muscle weakness (generalized)  Unsteadiness on feet     Problem List Patient Active Problem List   Diagnosis Date Noted  . Left hemiparesis (HCC)   . Essential hypertension 11/29/2016  . 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 Hepatitis C 07/22/2013  This entire session was performed under direct supervision and direction of a licensed therapist/therapist assistant . I have personally read, edited and approve of the note as written.  Stacey Drain, SPT Ezekiel Ina, PT, DPT 02/13/2017, 4:41 PM  [facility] MAIN [facility] SERVICES [address] [place], [place], [address] Phone: [phone]   Fax:  [phone]  Name: Faraz Ponciano MRN: [id number] Date of Birth: Jul 27, 1945

## 2017-02-17 ENCOUNTER — Ambulatory Visit: Payer: Medicare Other | Admitting: Speech Pathology

## 2017-02-17 ENCOUNTER — Ambulatory Visit: Payer: Medicare Other | Admitting: Physical Therapy

## 2017-02-17 ENCOUNTER — Encounter: Payer: Self-pay | Admitting: Physical Therapy

## 2017-02-17 ENCOUNTER — Ambulatory Visit: Payer: Medicare Other | Admitting: Occupational Therapy

## 2017-02-17 DIAGNOSIS — M6281 Muscle weakness (generalized): Secondary | ICD-10-CM

## 2017-02-17 DIAGNOSIS — R2681 Unsteadiness on feet: Secondary | ICD-10-CM | POA: Diagnosis not present

## 2017-02-17 DIAGNOSIS — I639 Cerebral infarction, unspecified: Secondary | ICD-10-CM | POA: Diagnosis not present

## 2017-02-17 DIAGNOSIS — R471 Dysarthria and anarthria: Secondary | ICD-10-CM | POA: Diagnosis not present

## 2017-02-17 DIAGNOSIS — R278 Other lack of coordination: Secondary | ICD-10-CM | POA: Diagnosis not present

## 2017-02-17 NOTE — Therapy (Signed)
[facility] MAIN [facility] SERVICES [address] [place], [place], [address] Phone: [phone]   Fax:  [phone]  Occupational Therapy Treatment  Patient Details  Name: Jason Nolan MRN: [id number] Date of Birth: 15-May-1945 Referring Provider: Dr. Wynn Banker  Encounter Date: 02/17/2017      OT End of Session - 02/17/17 1621    Visit Number 5   Number of Visits 24   Date for OT Re-Evaluation 04/14/17   Authorization Type Medicare G-Code 5 of 10   OT Start Time 1305   OT Stop Time 1345   OT Time Calculation (min) 40 min   Activity Tolerance Patient tolerated treatment well   Behavior During Therapy [facility] for tasks assessed/performed      Past Medical History:  Diagnosis Date  . Glaucoma   . Hepatitis C   . Stroke [facility])     Past Surgical History:  Procedure Laterality Date  . IR ANGIO INTRA EXTRACRAN SEL COM CAROTID INNOMINATE UNI L MOD SED  11/25/2016  . IR ANGIO VERTEBRAL SEL SUBCLAVIAN INNOMINATE UNI R MOD SED  11/25/2016  . IR ANGIO VERTEBRAL SEL VERTEBRAL UNI L MOD SED  11/25/2016  . IR INTRAVSC STENT CERV CAROTID W/O EMB-PROT MOD SED INC ANGIO  11/25/2016  . IR PERCUTANEOUS ART THROMBECTOMY/INFUSION INTRACRANIAL INC DIAG ANGIO  11/25/2016  . RADIOLOGY WITH ANESTHESIA N/A 11/25/2016   Procedure: RADIOLOGY WITH ANESTHESIA;  Surgeon: Radiologist, Medication, MD;  Location: [facility] OR;  Service: Radiology;  Laterality: N/A;    There were no vitals filed for this visit.      Subjective Assessment - 02/17/17 1619    Subjective  Pt. reports he is doing well.   Patient is accompained by: Family member   Pertinent History Pt. is a 71 y.o. 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 Pt. was transferred to [facility]. He underwent CTA brain showed short segment near occlusion of proximal R-ICA with associated intraluminal thrombus --likely due to dissection and emergent large vessel occlusion with right M2 occlusion and evolving right  frontal lobe infarct.  He underwent cerebral angio with  complete revascularization of occluded superior division of R-MCA and near complete revascularization of proximal R-ICA with stent assisted angioplasty.  Stroke felt to be embolic in setting of ruptured plaque R-ICA and he was placed on ASA and brillinta due to stent. Pt. was transferred to inpatient rehab for several weeks, had home health therapy upon discharge, and now is ready for outpatient therapy services.   Limitations Dominant LUE functioning, left hand edema, distal ROM, coordination, and overal strength.   Currently in Pain? No/denies      OT TREATMENT    Therapeutic Exercise:  Pt. worked on the [employer] for 8 min. with constant monitoring of the BUEs. Pt. worked on keeping his Left UE on the handle with a nonslip resistive mat. Pt. required repositioning of the hand onto the handle multiple times. Rest breaks were required. Pt. AROM for wrist extension, MP, PIP, and DIP flexion, and extension.  Manual Therapy:  Retrograde massage was performed to the left hand with hand in elevation. Pt. tolerated joint, and soft tissue mobes including: carpal rolls, and metacarpal spread stretches.                               OT Education - 02/17/17 1621    Education provided Yes   Education Details LUE functioning   Person(s)  Educated Patient   Methods Explanation   Comprehension Verbalized understanding;Returned demonstration             OT Long Term Goals - 01/20/17 1741      OT LONG TERM GOAL #1   Title Pt. will improve edema by 2 cm through MPs in preparation for ROM.   Baseline wrist: 20 cm, MPs: 25.5   Time 12   Period Weeks   Status New   Target Date 04/14/17     OT LONG TERM GOAL #2   Title Pt. will improve Left hand digit extension in preparation for weightbearing, and releasing objects during ADLs/IADLs.   Baseline Pt. is unable to achieve full digit extension on the left.   Time 12    Period Weeks   Status New   Target Date 04/14/17     OT LONG TERM GOAL #3   Title Pt. left hand grip with  increase by 5# in preparation for holding a cup.   Baseline Left 2#, RIght: 60   Time 12   Period Weeks   Status New   Target Date 04/14/17     OT LONG TERM GOAL #4   Title Pt. will increased right shoulder strength by 1 mm grade to assist with ADLs.   Baseline Impaired LUE strength   Time 12   Period Weeks   Status New   Target Date 04/14/17     OT LONG TERM GOAL #5   Title Pt. will increase left wrist extension in preparation for reaching ADL items.   Baseline wrist extension 28 degrees   Time 12   Period Weeks   Status New   Target Date 04/14/17     OT LONG TERM GOAL #6   Title Pt. will improve left hand coordination skills to be able to button clothing.   Baseline Pt. is unable   Time 12   Period Weeks   Status New   Target Date 04/14/17     OT LONG TERM GOAL #7   Title Pt. will independently demonstrate visual compensatory strategies duirng ADLs, and IADLs.   Baseline Glaucoma   Time 12   Period Weeks   Status New   Target Date 04/14/17               Plan - 02/17/17 1622    Clinical Impression Statement Pt. reports he is trying to use his left arm more at home now. Pt. reports he is trying to use his left hand to grasp items at home, and is working on the exercises provided at home. Pt. continues to present with edema in his left hand. Pt. is responding well to the edema control techniques. Pt. was fitted for an edema glove.    Occupational performance deficits (Please refer to evaluation for details): ADL's;IADL's   Rehab Potential Excellent   OT Frequency 2x / week   OT Duration 12 weeks   OT Treatment/Interventions Self-care/ADL training;Therapeutic exercise;Patient/family education;Energy conservation;Therapeutic activities;Manual Therapy;DME and/or AE instruction;Therapeutic exercises;Moist Heat;Passive range of motion;Visual/perceptual  remediation/compensation   Clinical Decision Making Multiple treatment options, significant modification of task necessary   Consulted and Agree with Plan of Care Patient      Patient will benefit from skilled therapeutic intervention in order to improve the following deficits and impairments:  Abnormal gait, Pain, Decreased strength, Decreased range of motion, Impaired UE functional use, Decreased coordination, Decreased knowledge of use of DME, Decreased activity tolerance, Difficulty walking, Decreased balance, Other (comment)  Visit Diagnosis:  Muscle weakness (generalized)    Problem List Patient Active Problem List   Diagnosis Date Noted  . Left hemiparesis (HCC)   . Essential hypertension 11/29/2016  . 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 Hepatitis C 07/22/2013    Olegario Messier, MS, OTR/L 02/17/2017, 4:30 PM  [place] [facility] MAIN [facility] SERVICES [address] [place], [place], [address] Phone: [phone]   Fax:  [phone]  Name: Jason Nolan MRN: [id number] Date of Birth: Oct 24, 1945

## 2017-02-17 NOTE — Therapy (Signed)
[place] [facility] MAIN [facility] SERVICES [address] [place], [place], [address] Phone: [phone]   Fax:  [phone]  Physical Therapy Treatment  Patient Details  Name: Jason Holycross MRN: [id number] Date of Birth: Oct 04, 1945 Referring Provider: [name]  Encounter Date: 02/17/2017      PT End of Session - 02/17/17 1505    Visit Number 7   Number of Visits 25   Date for PT Re-Evaluation 04/21/17   Authorization Type g code 7/10   PT Start Time 1500   PT Stop Time 1545   PT Time Calculation (min) 45 min   Equipment Utilized During Treatment Gait belt   Activity Tolerance Patient tolerated treatment well   Behavior During Therapy [facility] for tasks assessed/performed      Past Medical History:  Diagnosis Date  . Glaucoma   . Hepatitis C   . Stroke [facility])     Past Surgical History:  Procedure Laterality Date  . IR ANGIO INTRA EXTRACRAN SEL COM CAROTID INNOMINATE UNI L MOD SED  11/25/2016  . IR ANGIO VERTEBRAL SEL SUBCLAVIAN INNOMINATE UNI R MOD SED  11/25/2016  . IR ANGIO VERTEBRAL SEL VERTEBRAL UNI L MOD SED  11/25/2016  . IR INTRAVSC STENT CERV CAROTID W/O EMB-PROT MOD SED INC ANGIO  11/25/2016  . IR PERCUTANEOUS ART THROMBECTOMY/INFUSION INTRACRANIAL INC DIAG ANGIO  11/25/2016  . RADIOLOGY WITH ANESTHESIA N/A 11/25/2016   Procedure: RADIOLOGY WITH ANESTHESIA;  Surgeon: Radiologist, Medication, MD;  Location: [facility] OR;  Service: Radiology;  Laterality: N/A;    There were no vitals filed for this visit.      Subjective Assessment - 02/17/17 1504    Subjective Pt reports no pain or soreness today - states that overall he feels good.   Pertinent History Pt. is a 71 y.o. 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 Pt. was transferred to [facility]. He underwent CTA brain showed short segment near occlusion of proximal R-ICA with associated intraluminal thrombus --likely due to dissection and emergent large vessel  occlusion with right M2 occlusion and evolving right frontal lobe infarct.  He underwent cerebral angio with  complete revascularization of occluded superior division of R-MCA and near complete revascularization of proximal R-ICA with stent assisted angioplasty.  Stroke felt to be embolic in setting of ruptured plaque R-ICA and he was placed on ASA and brillinta due to stent. Pt. was transferred to inpatient rehab for several weeks, had home health therapy upon discharge.   Limitations Walking;Lifting;House hold activities   How long can you sit comfortably? unlimited    How long can you stand comfortably? unlimited    How long can you walk comfortably? unsure   Patient Stated Goals "strengthen my whole L side"   Currently in Pain? No/denies   Pain Score 0-No pain     Treatment:  Octane fitness x 5 min warmup   Resisted gait with 12.5lbs x 6 reps each direction - improved stability noted with return control   Leg press at 100 lbs x 20 reps    Single limb leg press at 75 lbs x 20 reps with 3 sec eccentric B LE's - cues for form    Heel raises on leg press x 20 at 100 lbs   Step ups at stair case with 2# ankle weights x 20 B LE's - no UE support to challenge balance   Step downs at stair case with 2# ankle weights x 20 B LE -  cues for proper form    Walking in hall with head turns x 2 - visual cues to increase gait speed to challenge balance; decreased stability noted    Walking in hall with vertical and downward gaze x 2   Ball toss standing on stacked foam pads x 20 passes in narrow and tandem stance - no LOB noted    Pt required cues verbal and visual for form and exercise technique - able to perform all balance exercises under supervision with no LOB noted                       PT Education - 02/17/17 1505    Education provided Yes   Education Details HEP continuation    Person(s) Educated Patient   Methods Explanation   Comprehension Verbalized  understanding;Returned demonstration          PT Short Term Goals - 01/27/17 1704      PT SHORT TERM GOAL #1   Title Pt will improve 5x sit to stand to 18 seconds in order to demonstrate improved LE strength and improved dynamic balance.   Baseline 21 sec   Time 6   Period Weeks   Status New   Target Date 03/10/17     PT SHORT TERM GOAL #2   Title Pt will improve gait speed to at least 1.0 m/s with proper heel strike and decreased hip hike in order to demonstrate imrpoved LE strength and balance.   Baseline 0.83 m/s   Time 6   Period Weeks   Status New   Target Date 03/10/17     PT SHORT TERM GOAL #3   Title Pt will improve Berg Balance score to 51/56 in order to demonstrate improved dynamic and static balance, decreasing overall falls risk.   Baseline 48/56   Time 6   Period Weeks   Status New   Target Date 03/10/17           PT Long Term Goals - 01/27/17 1707      PT LONG TERM GOAL #1   Title Pt will demonstrate independence with HEP in order to continue LE strengthening and manage symptoms.   Time 12   Period Weeks   Status New   Target Date 04/21/17     PT LONG TERM GOAL #2   Title Pt will improve 5x sit to stand to <15 seconds in order to demonstrate improved LE strength and dynamic balance.   Baseline 21 sec   Time 12   Period Weeks   Status New   Target Date 03/10/17     PT LONG TERM GOAL #3   Title Pt will demonstrate gait speed of at least 1.2 m/s in order to demonstrate improved LE strength and improved balance in order to return to prior level of funciton.   Baseline 0.833 m/s   Time 12   Period Weeks   Status New   Target Date 03/10/17     PT LONG TERM GOAL #4   Title Pt will improve Berg Balance score to at least 54/56 in order demonstrate improved dynamic and static balance, decreasing overall falls risk.   Baseline 48/56   Time 12   Period Weeks   Status New   Target Date 03/10/17     PT LONG TERM GOAL #5   Title Pt will  demonstrate improved LE strength to 4+/5 in all limited planes in order to return to prior level of function.  Baseline gross strength: 4/5   Time 12   Period Weeks   Status New   Target Date 03/10/17     Additional Long Term Goals   Additional Long Term Goals Yes     PT LONG TERM GOAL #6   Title Pt will improve to >1500 feet without using AD in order to return to prior level of function, demonstrating improvements in LE strength, balance and endurance.   Baseline 1205 ft without AD   Time 12   Period Weeks   Status New   Target Date 03/10/17               Plan - 02/17/17 1547    Clinical Impression Statement Pt continues to show improvement in LE strength and dynamic balance.  Pt was able to progress LE strength using increased weight with double and single limb.  Pt was also able to progress dynamic balance actives with no LOB noted.  Pt has shown improvements in LE single limb stability, with R and L LE's equal in terms of weight bearing during double limb and single limb activities.  Pt will continue to benefit from skilled therapy services to further strenghten LE's in open and closed kinetic chains, and dynamic balance training in order to decrease falls risk and improve functional mobility.   Rehab Potential Good   Clinical Impairments Affecting Rehab Potential high prior level of function   PT Frequency 2x / week   PT Duration 12 weeks   PT Treatment/Interventions ADLs/Self Care Home Management;Cryotherapy;Moist Heat;Gait training;Stair training;Functional mobility training;Therapeutic activities;Therapeutic exercise;Balance training;Neuromuscular re-education;Patient/family education;Manual techniques   PT Next Visit Plan Re-check goals   PT Home Exercise Plan Add green theraband to HEP exercises   Consulted and Agree with Plan of Care Patient      Patient will benefit from skilled therapeutic intervention in order to improve the following deficits and  impairments:  Abnormal gait, Decreased balance, Decreased strength, Difficulty walking, Increased edema  Visit Diagnosis: Muscle weakness (generalized)  Unsteadiness on feet     Problem List Patient Active Problem List   Diagnosis Date Noted  . Left hemiparesis (HCC)   . Essential hypertension 11/29/2016  . 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 Hepatitis C 07/22/2013  This entire session was performed under direct supervision and direction of a licensed therapist/therapist assistant . I have personally read, edited and approve of the note as written.  Stacey Drain, SPT Ezekiel Ina, PT, DPT 02/17/2017, 4:29 PM  [place] [facility] MAIN [facility] SERVICES [address] [place], [place], [address] Phone: [phone]   Fax:  [phone]  Name: Jason Nolan MRN: [id number] Date of Birth: [birth year]

## 2017-02-18 ENCOUNTER — Encounter: Payer: Self-pay | Admitting: Speech Pathology

## 2017-02-18 NOTE — Therapy (Signed)
[place] [facility] MAIN [facility] SERVICES [address] [place], [place], [address] Phone: [phone]   Fax:  [phone]  Speech Language Pathology Treatment  Patient Details  Name: Jason Nolan MRN: [id number] Date of Birth: [birth year] Referring Provider: [name]   Encounter Date: 02/17/2017      End of Session - 02/18/17 1015    Visit Number 2   Number of Visits 7   Date for SLP Re-Evaluation 03/25/17   SLP Start Time 1400   SLP Stop Time  1450   SLP Time Calculation (min) 50 min   Activity Tolerance Patient tolerated treatment well      Past Medical History:  Diagnosis Date  . Glaucoma   . Hepatitis C   . Stroke [facility])     Past Surgical History:  Procedure Laterality Date  . IR ANGIO INTRA EXTRACRAN SEL COM CAROTID INNOMINATE UNI L MOD SED  11/25/2016  . IR ANGIO VERTEBRAL SEL SUBCLAVIAN INNOMINATE UNI R MOD SED  11/25/2016  . IR ANGIO VERTEBRAL SEL VERTEBRAL UNI L MOD SED  11/25/2016  . IR INTRAVSC STENT CERV CAROTID W/O EMB-PROT MOD SED INC ANGIO  11/25/2016  . IR PERCUTANEOUS ART THROMBECTOMY/INFUSION INTRACRANIAL INC DIAG ANGIO  11/25/2016  . RADIOLOGY WITH ANESTHESIA N/A 11/25/2016   Procedure: RADIOLOGY WITH ANESTHESIA;  Surgeon: Radiologist, Medication, MD;  Location: [facility] OR;  Service: Radiology;  Laterality: N/A;    There were no vitals filed for this visit.      Subjective Assessment - 02/18/17 1014    Subjective "I'm not talking as well as when I left rehab"   Currently in Pain? No/denies               ADULT SLP TREATMENT - 02/18/17 0001      General Information   Behavior/Cognition Alert;Cooperative;Pleasant mood   HPI 71 year old man, S/P right MCA CVA on 11/25/2016.  The patient received inpatient rehab (including SLP) at [facility] 11/29/2016 - 12/21/2016.  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 He  is able express himself and speech is 90% intelligible with supervision needed to self-monitor and correct errors.      Treatment Provided   Treatment provided Cognitive-Linquistic     Pain Assessment   Pain Assessment No/denies pain     Cognitive-Linquistic Treatment   Treatment focused on Dysarthria   Skilled Treatment SPEECH: Patient demonstrates significantly improve ROM on left with spontaneous smiling with little generalization into speech. Patient given speech exercises including long "E" words, exaggerated vowels, exaggerated counting, and exaggerated words and sentences.       Assessment / Recommendations / Plan   Plan Continue with current plan of care     Progression Toward Goals   Progression toward goals Progressing toward goals          SLP Education - 02/18/17 1015    Education provided Yes   Education Details ezaggerated oral movements   Person(s) Educated Patient   Methods Explanation   Comprehension Verbalized understanding            SLP Long Term Goals - 02/12/17 0929      SLP LONG TERM GOAL #1   Title Pt will improve oral motor strength and coordination for speech by completing speech based oral motor exercises independently.   Time 6   Period Weeks   Status New   Target Date 03/25/17  SLP LONG TERM GOAL #2   Title Pt will improve speech intelligibility for multi-syllabic words by controlling rate of speech, over-articulation, and increased loudness to achieve 80% intelligibility with min. SLP cues.   Time 6   Period Weeks   Status New   Target Date 03/25/17     SLP LONG TERM GOAL #3   Title Pt will improve speech intelligibility and prosody in response to moderately complex cognitive linguistic tasks by controlling rate of speech, over-articulation, and increased loudness to achieve 80% or better intelligibility.   Time 6   Period Weeks   Status New   Target Date 03/25/17          Plan - 02/18/17 1015    Clinical Impression  Statement The patient demonstrates improved ROM for smile.  He has not generalized improved facial ROM into speech.   Speech Therapy Frequency 1x /week   Duration Other (comment)   Potential to Achieve Goals Good   Potential Considerations Ability to learn/carryover information;Co-morbidities;Cooperation/participation level;Medical prognosis;Pain level;Previous level of function;Severity of impairments;Family/community support   SLP Home Exercise Plan long "E" words, exaggerated vowels, exaggerated counting, and exaggerated words and sentences.     Consulted and Agree with Plan of Care Patient      Patient will benefit from skilled therapeutic intervention in order to improve the following deficits and impairments:   Dysarthria and anarthria    Problem List Patient Active Problem List   Diagnosis Date Noted  . Left hemiparesis (HCC)   . Essential hypertension 11/29/2016  . 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 Hepatitis C 07/22/2013   Jason Primrose, MS/CCC- SLP  [facility] 02/18/2017, 10:17 AM  [facility] SERVICES [address] [place], [place], [address] Phone: [phone]   Fax:  [phone]   Name: Jason Nolan MRN: [id number] Date of Birth: 1945-09-20

## 2017-02-19 ENCOUNTER — Ambulatory Visit: Payer: Medicare Other

## 2017-02-19 ENCOUNTER — Ambulatory Visit: Payer: Medicare Other | Admitting: Occupational Therapy

## 2017-02-19 ENCOUNTER — Ambulatory Visit: Payer: Medicare Other | Admitting: Speech Pathology

## 2017-02-19 DIAGNOSIS — M6281 Muscle weakness (generalized): Secondary | ICD-10-CM | POA: Diagnosis not present

## 2017-02-19 DIAGNOSIS — R278 Other lack of coordination: Secondary | ICD-10-CM

## 2017-02-19 DIAGNOSIS — R471 Dysarthria and anarthria: Secondary | ICD-10-CM | POA: Diagnosis not present

## 2017-02-19 DIAGNOSIS — R2681 Unsteadiness on feet: Secondary | ICD-10-CM | POA: Diagnosis not present

## 2017-02-19 DIAGNOSIS — I639 Cerebral infarction, unspecified: Secondary | ICD-10-CM | POA: Diagnosis not present

## 2017-02-19 NOTE — Therapy (Signed)
[facility] [address] [place], [place], [address] Phone: [phone]   Fax:  [phone]  Physical Therapy Treatment  Patient Details  Name: Jason CroakJames Nolan MRN: [id number] Date of Birth: August 31, 1945 Referring Provider: Lorre MunroeBAITY, REGINA W  Encounter Date: 02/19/2017      PT End of Session - 02/19/17 1504    Visit Number 8   Number of Visits 25   Date for PT Re-Evaluation 04/21/17   Authorization Type g code 8/10   PT Start Time 1500   PT Stop Time 1545   PT Time Calculation (min) 45 min   Equipment Utilized During Treatment Gait belt   Activity Tolerance Patient tolerated treatment well   Behavior During Therapy [facility] for tasks assessed/performed      Past Medical History:  Diagnosis Date  . Glaucoma   . Hepatitis C   . Stroke [facility])     Past Surgical History:  Procedure Laterality Date  . IR ANGIO INTRA EXTRACRAN SEL COM CAROTID INNOMINATE UNI L MOD SED  11/25/2016  . IR ANGIO VERTEBRAL SEL SUBCLAVIAN INNOMINATE UNI R MOD SED  11/25/2016  . IR ANGIO VERTEBRAL SEL VERTEBRAL UNI L MOD SED  11/25/2016  . IR INTRAVSC STENT CERV CAROTID W/O EMB-PROT MOD SED [facility] ANGIO  11/25/2016  . IR PERCUTANEOUS ART THROMBECTOMY/INFUSION INTRACRANIAL [facility] DIAG ANGIO  11/25/2016  . IR RADIOLOGIST EVAL & MGMT  02/13/2017  . RADIOLOGY WITH ANESTHESIA N/A 11/25/2016   Procedure: RADIOLOGY WITH ANESTHESIA;  Surgeon: Radiologist, Medication, MD;  Location: [facility] OR;  Service: Radiology;  Laterality: N/A;    There were no vitals filed for this visit.      Subjective Assessment - 02/19/17 1502    Subjective Patient reports no falls or LOB since last session. No pain reported.    Pertinent History Pt. is a 71 y.o. 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 [facility] He underwent CTA brain showed short segment near occlusion of proximal R-ICA with associated intraluminal thrombus --likely due to  dissection and emergent large vessel occlusion with right M2 occlusion and evolving right frontal lobe infarct.  He underwent cerebral angio with  complete revascularization of occluded superior division of R-MCA and near complete revascularization of proximal R-ICA with stent assisted angioplasty.  Stroke felt to be embolic in setting of ruptured plaque R-ICA and he was placed on ASA and brillinta due to stent. Pt. was transferred to inpatient rehab for several weeks, had home health therapy upon discharge.   Limitations Walking;Lifting;House hold activities   How long can you sit comfortably? unlimited    How long can you stand comfortably? unlimited    How long can you walk comfortably? unsure   Patient Stated Goals "strengthen my whole L side"   Currently in Pain? No/denies      Treatment:             Octane fitness x 3 min warmup  Neuro Re-ed               Walking in hall with head turns x 2 each direction horizontal, vertical, all directions - visual cues to increase gait speed to challenge balance; decreased stability noted                Walking in hall high knees x 2 lengths of hall. Occasional L foot scuff.  Airex ball pass tandem stance 2x30 throws.    Orange hurdle: step over and back 10x each leg forward, 10x each leg sideway  Agility ladder:   Forward one foot in each box x6  Side step 6x  Two feet in two feet out 8x, increase in speed  Lateral two in two out 4x increasing in speed.   TherEx Tilt board   15 x df  15x pf  15x both  Resisted df seated red TB x20 LLE Resisted pf seated red TB x 15  Pt required cues verbal and visual for form and exercise technique - able to perform all balance exercises under supervision with no LOB noted                            PT Education - 02/19/17 1503    Education provided Yes   Education Details body mechanics and functional strength   Person(s) Educated Patient   Methods  Explanation;Demonstration   Comprehension Verbalized understanding;Returned demonstration          PT Short Term Goals - 01/27/17 1704      PT SHORT TERM GOAL #1   Title Pt will improve 5x sit to stand to 18 seconds in order to demonstrate improved LE strength and improved dynamic balance.   Baseline 21 sec   Time 6   Period Weeks   Status New   Target Date 03/10/17     PT SHORT TERM GOAL #2   Title Pt will improve gait speed to at least 1.0 m/s with proper heel strike and decreased hip hike in order to demonstrate imrpoved LE strength and balance.   Baseline 0.83 m/s   Time 6   Period Weeks   Status New   Target Date 03/10/17     PT SHORT TERM GOAL #3   Title Pt will improve Berg Balance score to 51/56 in order to demonstrate improved dynamic and static balance, decreasing overall falls risk.   Baseline 48/56   Time 6   Period Weeks   Status New   Target Date 03/10/17           PT Long Term Goals - 01/27/17 1707      PT LONG TERM GOAL #1   Title Pt will demonstrate independence with HEP in order to continue LE strengthening and manage symptoms.   Time 12   Period Weeks   Status New   Target Date 04/21/17     PT LONG TERM GOAL #2   Title Pt will improve 5x sit to stand to <15 seconds in order to demonstrate improved LE strength and dynamic balance.   Baseline 21 sec   Time 12   Period Weeks   Status New   Target Date 03/10/17     PT LONG TERM GOAL #3   Title Pt will demonstrate gait speed of at least 1.2 m/s in order to demonstrate improved LE strength and improved balance in order to return to prior level of funciton.   Baseline 0.833 m/s   Time 12   Period Weeks   Status New   Target Date 03/10/17     PT LONG TERM GOAL #4   Title Pt will improve Berg Balance score to at least 54/56 in order demonstrate improved dynamic and static balance, decreasing overall falls risk.   Baseline 48/56   Time 12   Period Weeks   Status New   Target Date  03/10/17      PT LONG TERM GOAL #5   Title Pt will demonstrate improved LE strength to 4+/5 in all limited planes in order to return to prior level of function.   Baseline gross strength: 4/5   Time 12   Period Weeks   Status New   Target Date 03/10/17     Additional Long Term Goals   Additional Long Term Goals Yes     PT LONG TERM GOAL #6   Title Pt will improve to >1500 feet without using AD in order to return to prior level of function, demonstrating improvements in LE strength, balance and endurance.   Baseline 1205 ft without AD   Time 12   Period Weeks   Status New   Target Date 03/10/17               Plan - 02/19/17 1602    Clinical Impression Statement Patient progressing with dynamic coordinated LE movements. Patient continues to have occasional scuffing/drag of LLE when fatigued. Decreased weight bearing of LLE noted in tandem stance. Horizontal head movements challenge patient.  Patient will continue to benefit from skilled physical therapy to address further LE strength and stability deficits in order to decrease falls risk, and improve functional mobility.    Rehab Potential Good   Clinical Impairments Affecting Rehab Potential high prior level of function   PT Frequency 2x / week   PT Duration 12 weeks   PT Treatment/Interventions ADLs/Self Care Home Management;Cryotherapy;Moist Heat;Gait training;Stair training;Functional mobility training;Therapeutic activities;Therapeutic exercise;Balance training;Neuromuscular re-education;Patient/family education;Manual techniques   PT Next Visit Plan Re-check goals   PT Home Exercise Plan Add green theraband to HEP exercises   Consulted and Agree with Plan of Care Patient      Patient will benefit from skilled therapeutic intervention in order to improve the following deficits and impairments:  Abnormal gait, Decreased balance, Decreased strength, Difficulty walking, Increased edema  Visit Diagnosis: Muscle weakness  (generalized)  Unsteadiness on feet  Other lack of coordination     Problem List Patient Active Problem List   Diagnosis Date Noted  . Left hemiparesis (HCC)   . Essential hypertension 11/29/2016  . 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 Hepatitis C 07/22/2013   Precious Bard, PT, DPT   Precious Bard 02/19/2017, 4:03 PM  [facility] SERVICES [address] [place], [place], [address] Phone: [phone]   Fax:  [phone]  Name: Sanford Lindblad MRN: [id number] Date of Birth: 04-Feb-1946

## 2017-02-21 DIAGNOSIS — Z23 Encounter for immunization: Secondary | ICD-10-CM | POA: Diagnosis not present

## 2017-02-24 ENCOUNTER — Ambulatory Visit: Payer: Medicare Other

## 2017-02-24 ENCOUNTER — Ambulatory Visit: Payer: Medicare Other | Admitting: Speech Pathology

## 2017-02-24 VITALS — BP 119/76 | HR 59

## 2017-02-24 DIAGNOSIS — R471 Dysarthria and anarthria: Secondary | ICD-10-CM

## 2017-02-24 DIAGNOSIS — R278 Other lack of coordination: Secondary | ICD-10-CM | POA: Diagnosis not present

## 2017-02-24 DIAGNOSIS — I639 Cerebral infarction, unspecified: Secondary | ICD-10-CM | POA: Diagnosis not present

## 2017-02-24 DIAGNOSIS — M6281 Muscle weakness (generalized): Secondary | ICD-10-CM | POA: Diagnosis not present

## 2017-02-24 DIAGNOSIS — R2681 Unsteadiness on feet: Secondary | ICD-10-CM | POA: Diagnosis not present

## 2017-02-24 NOTE — Therapy (Signed)
[facility] MAIN [facility] SERVICES [address] [place], [place], [address] Phone: [phone]   Fax:  [phone]  Physical Therapy Treatment  Patient Details  Name: Jason Nolan MRN: [id number] Date of Birth: [birth year] Referring Provider: [name]  Encounter Date: 02/24/2017      PT End of Session - 02/24/17 1459    Visit Number 9   Number of Visits 25   Date for PT Re-Evaluation 04/21/17   Authorization Type g code 9/10   PT Start Time 1301   PT Stop Time 1345   PT Time Calculation (min) 44 min   Equipment Utilized During Treatment Gait belt   Activity Tolerance Patient tolerated treatment well   Behavior During Therapy [facility] for tasks assessed/performed      Past Medical History:  Diagnosis Date  . Glaucoma   . Hepatitis C   . Stroke [facility])     Past Surgical History:  Procedure Laterality Date  . IR ANGIO INTRA EXTRACRAN SEL COM CAROTID INNOMINATE UNI L MOD SED  11/25/2016  . IR ANGIO VERTEBRAL SEL SUBCLAVIAN INNOMINATE UNI R MOD SED  11/25/2016  . IR ANGIO VERTEBRAL SEL VERTEBRAL UNI L MOD SED  11/25/2016  . IR INTRAVSC STENT CERV CAROTID W/O EMB-PROT MOD SED INC ANGIO  11/25/2016  . IR PERCUTANEOUS ART THROMBECTOMY/INFUSION INTRACRANIAL INC DIAG ANGIO  11/25/2016  . IR RADIOLOGIST EVAL & MGMT  02/13/2017  . RADIOLOGY WITH ANESTHESIA N/A 11/25/2016   Procedure: RADIOLOGY WITH ANESTHESIA;  Surgeon: Radiologist, Medication, MD;  Location: [facility] OR;  Service: Radiology;  Laterality: N/A;    Vitals:   02/24/17 1506  BP: 119/76  Pulse: (!) 59  SpO2: 100%        Subjective Assessment - 02/24/17 1459    Subjective Pt reports he is doing well today. No specific questions or concerns currently. Denies changes in health or medications since last visit. He is performing HEP. No falls since last therapy session.    Pertinent History Pt. is a 71 y.o. 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 Pt. was  transferred to [facility]. He underwent CTA brain showed short segment near occlusion of proximal R-ICA with associated intraluminal thrombus --likely due to dissection and emergent large vessel occlusion with right M2 occlusion and evolving right frontal lobe infarct.  He underwent cerebral angio with  complete revascularization of occluded superior division of R-MCA and near complete revascularization of proximal R-ICA with stent assisted angioplasty.  Stroke felt to be embolic in setting of ruptured plaque R-ICA and he was placed on ASA and brillinta due to stent. Pt. was transferred to inpatient rehab for several weeks, had home health therapy upon discharge.   Limitations Walking;Lifting;House hold activities   How long can you sit comfortably? unlimited    How long can you stand comfortably? unlimited    How long can you walk comfortably? unsure   Patient Stated Goals "strengthen my whole L side"   Currently in Pain? No/denies          TREATMENT  Neuromuscular Re-education BOSU round side up forward step-ups alternating LE x 10 each; Rockerboard A/P and R/L orientation static balance followed by horizontal and vertical head turns; Rockerboard A/P and R/L orientation with weight shifting x multiple bouts each; Airex balance with toe taps to 6" step alternating LE x 10 each; Airex single leg balance while using contralateral LE to knock tennis ball off cone x multiple bouts on each  side, practice required to be able to complete correctly and notable SLS deficits on LLE;  Ther-ex  Octane fitness L5-L7 x 4 min warmup during history, fatigue monitored and resistance adjusted accordingly; Quantum single leg press 75# x 15, 90# x 15, 95# x 15 on both sides; BOSU forward lunges (round side up) alternating LE x 15 each; BOSU side lunges (round side up) x 15 both directions;  Pt required cues verbal and demonstration for form and exercise technique. Intermittent finger touches on parallel  bars with SLS on Airex activities.                        PT Education - 02/24/17 1459    Education provided Yes   Education Details exercise form/technique, golf club modifications for decreased L grip strength   Person(s) Educated Patient   Methods Explanation   Comprehension Verbalized understanding;Returned demonstration          PT Short Term Goals - 01/27/17 1704      PT SHORT TERM GOAL #1   Title Pt will improve 5x sit to stand to 18 seconds in order to demonstrate improved LE strength and improved dynamic balance.   Baseline 21 sec   Time 6   Period Weeks   Status New   Target Date 03/10/17     PT SHORT TERM GOAL #2   Title Pt will improve gait speed to at least 1.0 m/s with proper heel strike and decreased hip hike in order to demonstrate imrpoved LE strength and balance.   Baseline 0.83 m/s   Time 6   Period Weeks   Status New   Target Date 03/10/17     PT SHORT TERM GOAL #3   Title Pt will improve Berg Balance score to 51/56 in order to demonstrate improved dynamic and static balance, decreasing overall falls risk.   Baseline 48/56   Time 6   Period Weeks   Status New   Target Date 03/10/17           PT Long Term Goals - 01/27/17 1707      PT LONG TERM GOAL #1   Title Pt will demonstrate independence with HEP in order to continue LE strengthening and manage symptoms.   Time 12   Period Weeks   Status New   Target Date 04/21/17     PT LONG TERM GOAL #2   Title Pt will improve 5x sit to stand to <15 seconds in order to demonstrate improved LE strength and dynamic balance.   Baseline 21 sec   Time 12   Period Weeks   Status New   Target Date 03/10/17     PT LONG TERM GOAL #3   Title Pt will demonstrate gait speed of at least 1.2 m/s in order to demonstrate improved LE strength and improved balance in order to return to prior level of funciton.   Baseline 0.833 m/s   Time 12   Period Weeks   Status New   Target Date 03/10/17      PT LONG TERM GOAL #4   Title Pt will improve Berg Balance score to at least 54/56 in order demonstrate improved dynamic and static balance, decreasing overall falls risk.   Baseline 48/56   Time 12   Period Weeks   Status New   Target Date 03/10/17     PT LONG TERM GOAL #5   Title Pt will demonstrate improved LE strength to 4+/5 in  all limited planes in order to return to prior level of function.   Baseline gross strength: 4/5   Time 12   Period Weeks   Status New   Target Date 03/10/17     Additional Long Term Goals   Additional Long Term Goals Yes     PT LONG TERM GOAL #6   Title Pt will improve 6MWT to >1500 feet without using AD in order to return to prior level of function, demonstrating improvements in LE strength, balance and endurance.   Baseline 1205 ft without AD   Time 12   Period Weeks   Status New   Target Date 03/10/17               Plan - 02/24/17 1459    Clinical Impression Statement Pt demonstrates good motivation during therapy session today. Appropriate fatigue noted with L single leg press. Pt required cues verbal and demonstration for form and exercise technique. Intermittent finger touches on parallel bars with SLS on Airex activities. Will assess goals at next session. Pt will continue benefit from PT services to address deficits in strength, balance, and mobility in order to return to full function at home.    Rehab Potential Good   Clinical Impairments Affecting Rehab Potential high prior level of function   PT Frequency 2x / week   PT Duration 12 weeks   PT Treatment/Interventions ADLs/Self Care Home Management;Cryotherapy;Moist Heat;Gait training;Stair training;Functional mobility training;Therapeutic activities;Therapeutic exercise;Balance training;Neuromuscular re-education;Patient/family education;Manual techniques   PT Next Visit Plan Re-check goals   PT Home Exercise Plan Add green theraband to HEP exercises   Consulted and Agree with  Plan of Care Patient      Patient will benefit from skilled therapeutic intervention in order to improve the following deficits and impairments:  Abnormal gait, Decreased balance, Decreased strength, Difficulty walking, Increased edema  Visit Diagnosis: Muscle weakness (generalized)  Unsteadiness on feet     Problem List Patient Active Problem List   Diagnosis Date Noted  . Left hemiparesis (HCC)   . Essential hypertension 11/29/2016  . 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 Hepatitis C 07/22/2013   Lynnea MaizesJason D Huprich PT, DPT   Huprich,Jason 02/25/2017, 9:08 AM  [place] [facility]IN [facility] [address] [place], [place], [address] Phone: [phone]   Fax:  [phone]  Name: Jason Nolan MRN: [id number] Date of Birth: 11/15/1945

## 2017-02-25 ENCOUNTER — Encounter: Payer: Self-pay | Admitting: Speech Pathology

## 2017-02-25 ENCOUNTER — Encounter: Payer: Medicare Other | Attending: Physical Medicine & Rehabilitation

## 2017-02-25 ENCOUNTER — Encounter: Payer: Self-pay | Admitting: Physical Medicine & Rehabilitation

## 2017-02-25 ENCOUNTER — Ambulatory Visit (HOSPITAL_BASED_OUTPATIENT_CLINIC_OR_DEPARTMENT_OTHER): Payer: Medicare Other | Admitting: Physical Medicine & Rehabilitation

## 2017-02-25 VITALS — BP 154/81 | HR 58

## 2017-02-25 DIAGNOSIS — I69398 Other sequelae of cerebral infarction: Secondary | ICD-10-CM | POA: Diagnosis not present

## 2017-02-25 DIAGNOSIS — H409 Unspecified glaucoma: Secondary | ICD-10-CM | POA: Diagnosis not present

## 2017-02-25 DIAGNOSIS — G8194 Hemiplegia, unspecified affecting left nondominant side: Secondary | ICD-10-CM

## 2017-02-25 DIAGNOSIS — Z87891 Personal history of nicotine dependence: Secondary | ICD-10-CM | POA: Insufficient documentation

## 2017-02-25 DIAGNOSIS — I69352 Hemiplegia and hemiparesis following cerebral infarction affecting left dominant side: Secondary | ICD-10-CM | POA: Insufficient documentation

## 2017-02-25 DIAGNOSIS — R209 Unspecified disturbances of skin sensation: Secondary | ICD-10-CM

## 2017-02-25 DIAGNOSIS — R600 Localized edema: Secondary | ICD-10-CM | POA: Insufficient documentation

## 2017-02-25 DIAGNOSIS — I639 Cerebral infarction, unspecified: Secondary | ICD-10-CM

## 2017-02-25 NOTE — Therapy (Signed)
[place] [facility] MAIN [facility] SERVICES [address] [place], [place], [address] Phone: [phone]   Fax:  [phone]  Speech Language Pathology Treatment  Patient Details  Name: Jason Nolan MRN: [id number] Date of Birth: 11-01-1945 Referring Provider: [name]   Encounter Date: 02/24/2017      End of Session - 02/25/17 1128    Visit Number 3   Number of Visits 7   Date for SLP Re-Evaluation 03/25/17   SLP Start Time 1600   SLP Stop Time  1650   SLP Time Calculation (min) 50 min   Activity Tolerance Patient tolerated treatment well      Past Medical History:  Diagnosis Date  . Glaucoma   . Hepatitis C   . Stroke [facility])     Past Surgical History:  Procedure Laterality Date  . IR ANGIO INTRA EXTRACRAN SEL COM CAROTID INNOMINATE UNI L MOD SED  11/25/2016  . IR ANGIO VERTEBRAL SEL SUBCLAVIAN INNOMINATE UNI R MOD SED  11/25/2016  . IR ANGIO VERTEBRAL SEL VERTEBRAL UNI L MOD SED  11/25/2016  . IR INTRAVSC STENT CERV CAROTID W/O EMB-PROT MOD SED INC ANGIO  11/25/2016  . IR PERCUTANEOUS ART THROMBECTOMY/INFUSION INTRACRANIAL INC DIAG ANGIO  11/25/2016  . IR RADIOLOGIST EVAL & MGMT  02/13/2017  . RADIOLOGY WITH ANESTHESIA N/A 11/25/2016   Procedure: RADIOLOGY WITH ANESTHESIA;  Surgeon: Radiologist, Medication, MD;  Location: [facility] OR;  Service: Radiology;  Laterality: N/A;    There were no vitals filed for this visit.      Subjective Assessment - 02/25/17 1127    Subjective "My tonuge isn't working right"   Currently in Pain? No/denies               ADULT SLP TREATMENT - 02/25/17 0001      General Information   Behavior/Cognition Alert;Cooperative;Pleasant mood   HPI 71 year old man, S/P right MCA CVA on 11/25/2016.  The patient received inpatient rehab (including SLP) at [facility] 11/29/2016 - 12/21/2016.  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 He is able express himself and speech is 90% intelligible with supervision needed to self-monitor and correct errors.      Treatment Provided   Treatment provided Cognitive-Linquistic     Pain Assessment   Pain Assessment No/denies pain     Cognitive-Linquistic Treatment   Treatment focused on Dysarthria   Skilled Treatment SPEECH: Patient demonstrates significantly improved ROM on left with spontaneous smiling with some generalization into speech. Patient given speech exercises including long "E" words, exaggerated vowels, exaggerated counting, and exaggerated words and sentences.  Patient given articulatory precision exercises (initial and final "ch" and "j" in words and sentences).  Patient observed to have distorted /r/ and was given r/w minimal pairs to practice     Assessment / Recommendations / Plan   Plan Continue with current plan of care     Progression Toward Goals   Progression toward goals Progressing toward goals          SLP Education - 02/25/17 1127    Education provided Yes   Education Details ch/j/r production   Person(s) Educated Patient   Methods Explanation;Demonstration;Verbal cues   Comprehension Verbalized understanding;Returned demonstration            SLP Long Term Goals - 02/12/17 0929      SLP LONG TERM GOAL #1   Title Pt will improve  oral motor strength and coordination for speech by completing speech based oral motor exercises independently.   Time 6   Period Weeks   Status New   Target Date 03/25/17     SLP LONG TERM GOAL #2   Title Pt will improve speech intelligibility for multi-syllabic words by controlling rate of speech, over-articulation, and increased loudness to achieve 80% intelligibility with min. SLP cues.   Time 6   Period Weeks   Status New   Target Date 03/25/17     SLP LONG TERM GOAL #3   Title Pt will improve speech intelligibility and prosody in response to moderately complex cognitive  linguistic tasks by controlling rate of speech, over-articulation, and increased loudness to achieve 80% or better intelligibility.   Time 6   Period Weeks   Status New   Target Date 03/25/17          Plan - 02/25/17 1128    Clinical Impression Statement The patient demonstrates improved ROM for smile.  He demonstrates improved generalization of improved facial ROM into speech.   Speech Therapy Frequency 1x /week   Duration Other (comment)   Treatment/Interventions Oral motor exercises;SLP instruction and feedback;Patient/family education   Potential to Achieve Goals Good   Potential Considerations Ability to learn/carryover information;Co-morbidities;Cooperation/participation level;Medical prognosis;Pain level;Previous level of function;Severity of impairments;Family/community support   SLP Home Exercise Plan long "E" words, exaggerated vowels, exaggerated counting, and exaggerated words and sentences.  ch/j/r words/sentence.  r/w minimal pairs   Consulted and Agree with Plan of Care Patient      Patient will benefit from skilled therapeutic intervention in order to improve the following deficits and impairments:   Dysarthria and anarthria    Problem List Patient Active Problem List   Diagnosis Date Noted  . Left hemiparesis (HCC)   . Essential hypertension 11/29/2016  . 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 Hepatitis C 07/22/2013   Dollene PrimroseSusan G Bessie Boyte, MS/CCC- SLP  Leandrew KoyanagiAbernathy, Susie 02/25/2017, 11:30 AM  [place] [facility] [address] [place], [place], [address] Phone: [phone]   Fax:  [phone]   Name: Jason CroakJames Nolan MRN: [id number] Date of Birth: Jan 25, 1946

## 2017-02-25 NOTE — Progress Notes (Signed)
Subjective:    Patient ID: Jason Nolan, male    DOB: Mar 10, 1946, 71 y.o.   MRN: [id number] 71 y.o.left handed male with history of Hep C-treated with Harvoni, glaucoma who was admitted via [facility] on 7/23 with left sided weakness, facial droop and slurred speech due to early changes of R-MCA territory infarct. He underwent CTA brain showed short segment near occlusion of proximal R-ICA with associated intraluminal thrombus --likely due to dissection and emergent large vessel occlusion with right M2 occlusion and evolving right frontal lobe infarct. He underwent cerebral angio with complete revascularization of occluded superior division of R-MCA and near complete revascularization of proximal R-ICA with stent assisted angioplasty. Stroke felt to be embolic in setting of ruptured plaque R-ICA and he was placedon ASA and brillinta due to stent. Patient with resultant left sided weakness, expressive deficits with aphonia and dysphagia Admit date: 11/29/2016 Discharge date:12/21/2016 HPI PT, OT, SLP, [facility] outpt rehab Working on E sound with SLP In physical therapy is working on single leg stance, Bosu lunges Current Berg balance score 48/56 with goal of 51/56 OT working on hand edema, increasing hand grip strength Has returned to driving Patient states that he is working on his home exercise program on a daily basis Pain Inventory Average Pain 0 Pain Right Now 0 My pain is na  In the last 24 hours, has pain interfered with the following? General activity 0 Relation with others 0 Enjoyment of life 0 What TIME of day is your pain at its worst? na Sleep (in general) Good  Pain is worse with: na Pain improves with: na Relief from Meds: na  Mobility walk without assistance ability to climb steps?  yes do you drive?  yes  Function retired  Neuro/Psych No problems in this area  Prior Studies Any changes since last visit?  no  Physicians involved in your care Any changes  since last visit?  no   Family History  Problem Relation Age of Onset  . Hypertension Mother   . Hypertension Father    Social History   Social History  . Marital status: Married    Spouse name: N/A  . Number of children: N/A  . Years of education: N/A   Social History Main Topics  . Smoking status: Former Games developer  . Smokeless tobacco: Never Used  . Alcohol use No  . Drug use: No  . Sexual activity: Yes    Partners: Female    Birth control/ protection: None   Other Topics Concern  . None   Social History Narrative  . None   Past Surgical History:  Procedure Laterality Date  . IR ANGIO INTRA EXTRACRAN SEL COM CAROTID INNOMINATE UNI L MOD SED  11/25/2016  . IR ANGIO VERTEBRAL SEL SUBCLAVIAN INNOMINATE UNI R MOD SED  11/25/2016  . IR ANGIO VERTEBRAL SEL VERTEBRAL UNI L MOD SED  11/25/2016  . IR INTRAVSC STENT CERV CAROTID W/O EMB-PROT MOD SED INC ANGIO  11/25/2016  . IR PERCUTANEOUS ART THROMBECTOMY/INFUSION INTRACRANIAL INC DIAG ANGIO  11/25/2016  . IR RADIOLOGIST EVAL & MGMT  02/13/2017  . RADIOLOGY WITH ANESTHESIA N/A 11/25/2016   Procedure: RADIOLOGY WITH ANESTHESIA;  Surgeon: Radiologist, Medication, MD;  Location: [facility] OR;  Service: Radiology;  Laterality: N/A;   Past Medical History:  Diagnosis Date  . Glaucoma   . Hepatitis C   . Stroke (HCC)    BP (!) 154/81   Pulse (!) 58   SpO2 98%   Opioid Risk Score:  Fall Risk Score:  `1  Depression screen PHQ 2/9  [facility] 12/31/2016 01/26/2016 10/27/2014 07/22/2013  Decreased Interest 0 0 0 0  Down, Depressed, Hopeless 0 0 0 0  PHQ - 2 Score 0 0 0 0  Altered sleeping 0 - - -  Tired, decreased energy 0 - - -  Change in appetite 0 - - -  Feeling bad or failure about yourself  0 - - -  Trouble concentrating 0 - - -  Moving slowly or fidgety/restless 0 - - -  Suicidal thoughts 0 - - -  PHQ-9 Score 0 - - -  Difficult doing work/chores Not difficult at all - - -  '   Review of Systems    Constitutional: Negative.   HENT: Negative.   Eyes: Negative.   Respiratory: Negative.   Cardiovascular: Negative.   Gastrointestinal: Negative.   Endocrine: Negative.   Genitourinary: Negative.   Musculoskeletal: Negative.   Skin: Negative.   Allergic/Immunologic: Negative.   Neurological: Negative.   Hematological: Negative.   Psychiatric/Behavioral: Negative.   All other systems reviewed and are negative.      Objective:   Physical Exam  Constitutional: He is oriented to person, place, and time. He appears well-developed and well-nourished. No distress.  HENT:  Head: Normocephalic and atraumatic.  Eyes: Pupils are equal, round, and reactive to light. Conjunctivae and EOM are normal. Right eye exhibits no discharge. Left eye exhibits no discharge.  Neck: Normal range of motion. Neck supple.  Neurological: He is alert and oriented to person, place, and time.  Mild to moderate dysarthria Mild left central 7 palsy  Skin: He is not diaphoretic.  Psychiatric: He has a normal mood and affect. His behavior is normal. Judgment and thought content normal.  Nursing note and vitals reviewed.   Touch sensation reduced on the left thumb Motor strength is 3- at the finger flexors extensors elbow flexors extensors and at the deltoid on the left side 5/5 on the right side  Able to do tandem gait, toe walk, heel walk reduced on Left side     Assessment & Plan:  #1.  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 Continue outpatient PT OT speech Discussed timeframe of recovery from CVA Will return in approximately 6 weeks after completing outpatient therapy.

## 2017-02-26 ENCOUNTER — Ambulatory Visit: Payer: Medicare Other

## 2017-02-26 ENCOUNTER — Ambulatory Visit: Payer: Medicare Other | Admitting: Speech Pathology

## 2017-02-26 ENCOUNTER — Ambulatory Visit: Payer: Medicare Other | Admitting: Occupational Therapy

## 2017-02-26 VITALS — BP 129/72 | HR 60

## 2017-02-26 DIAGNOSIS — M6281 Muscle weakness (generalized): Secondary | ICD-10-CM | POA: Diagnosis not present

## 2017-02-26 DIAGNOSIS — R471 Dysarthria and anarthria: Secondary | ICD-10-CM | POA: Diagnosis not present

## 2017-02-26 DIAGNOSIS — R2681 Unsteadiness on feet: Secondary | ICD-10-CM | POA: Diagnosis not present

## 2017-02-26 DIAGNOSIS — R278 Other lack of coordination: Secondary | ICD-10-CM | POA: Diagnosis not present

## 2017-02-26 DIAGNOSIS — I639 Cerebral infarction, unspecified: Secondary | ICD-10-CM | POA: Diagnosis not present

## 2017-02-26 NOTE — Therapy (Signed)
[facility] MAIN [facility] SERVICES [address] [place], [place], [address] Phone: [phone]   Fax:  [phone]  Occupational Therapy Treatment  Patient Details  Name: Jason Nolan MRN: [id number] Date of Birth: Oct 28, 1945 Referring Provider: Dr. Wynn Banker  Encounter Date: 02/26/2017      OT End of Session - 02/26/17 1611    Visit Number 6   Number of Visits 24   Date for OT Re-Evaluation 04/14/17   Authorization Type Medicare G-Code 6 of 10   OT Start Time 1420   OT Stop Time 1505   OT Time Calculation (min) 45 min   Activity Tolerance Patient tolerated treatment well   Behavior During Therapy [facility] for tasks assessed/performed      Past Medical History:  Diagnosis Date  . Glaucoma   . Hepatitis C   . Stroke [facility])     Past Surgical History:  Procedure Laterality Date  . IR ANGIO INTRA EXTRACRAN SEL COM CAROTID INNOMINATE UNI L MOD SED  11/25/2016  . IR ANGIO VERTEBRAL SEL SUBCLAVIAN INNOMINATE UNI R MOD SED  11/25/2016  . IR ANGIO VERTEBRAL SEL VERTEBRAL UNI L MOD SED  11/25/2016  . IR INTRAVSC STENT CERV CAROTID W/O EMB-PROT MOD SED INC ANGIO  11/25/2016  . IR PERCUTANEOUS ART THROMBECTOMY/INFUSION INTRACRANIAL INC DIAG ANGIO  11/25/2016  . IR RADIOLOGIST EVAL & MGMT  02/13/2017  . RADIOLOGY WITH ANESTHESIA N/A 11/25/2016   Procedure: RADIOLOGY WITH ANESTHESIA;  Surgeon: Radiologist, Medication, MD;  Location: [facility] OR;  Service: Radiology;  Laterality: N/A;    There were no vitals filed for this visit.      Subjective Assessment - 02/26/17 1609    Subjective  Pt. reports no pain   Patient is accompained by: Family member   Pertinent History Pt. is a 71 y.o. 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 Pt. was transferred to [facility]. He underwent CTA brain showed short segment near occlusion of proximal R-ICA with associated intraluminal thrombus --likely due to dissection and emergent large vessel occlusion with right  M2 occlusion and evolving right frontal lobe infarct.  He underwent cerebral angio with  complete revascularization of occluded superior division of R-MCA and near complete revascularization of proximal R-ICA with stent assisted angioplasty.  Stroke felt to be embolic in setting of ruptured plaque R-ICA and he was placed on ASA and brillinta due to stent. Pt. was transferred to inpatient rehab for several weeks, had home health therapy upon discharge, and now is ready for outpatient therapy services.   Limitations Dominant LUE functioning, left hand edema, distal ROM, coordination, and overal strength.   Patient Stated Goals To regain the use of his LUE.   Pain Score 0-No pain     OT TREATMENT    Neuro muscular re-education:  Pt. worked on grasping 1" objects including: grasping 1" flat objects with his thumb, and second digit. Pt. worked on grasping, and stacking 1" objects. Pt. required verbal cues, and visual demonstration.   Therapeutic Exercise:  Pt. Worked on gross gripping, and active releasing. Pt. Worked on pinch strengthening with cues and assist for hand position.  Pt. Was fit for a medium sized edema glove.                            OT Education - 02/26/17 1611    Education provided Yes   Education Details UE strength, and coordination.   Person(s) Educated  Patient   Methods Explanation;Demonstration;Verbal cues   Comprehension Returned demonstration;Verbalized understanding             OT Long Term Goals - 01/20/17 1741      OT LONG TERM GOAL #1   Title Pt. will improve edema by 2 cm through MPs in preparation for ROM.   Baseline wrist: 20 cm, MPs: 25.5   Time 12   Period Weeks   Status New   Target Date 04/14/17     OT LONG TERM GOAL #2   Title Pt. will improve Left hand digit extension in preparation for weightbearing, and releasing objects during ADLs/IADLs.   Baseline Pt. is unable to achieve full digit extension on the left.    Time 12   Period Weeks   Status New   Target Date 04/14/17     OT LONG TERM GOAL #3   Title Pt. left hand grip with  increase by 5# in preparation for holding a cup.   Baseline Left 2#, RIght: 60   Time 12   Period Weeks   Status New   Target Date 04/14/17     OT LONG TERM GOAL #4   Title Pt. will increased right shoulder strength by 1 mm grade to assist with ADLs.   Baseline Impaired LUE strength   Time 12   Period Weeks   Status New   Target Date 04/14/17     OT LONG TERM GOAL #5   Title Pt. will increase left wrist extension in preparation for reaching ADL items.   Baseline wrist extension 28 degrees   Time 12   Period Weeks   Status New   Target Date 04/14/17     OT LONG TERM GOAL #6   Title Pt. will improve left hand coordination skills to be able to button clothing.   Baseline Pt. is unable   Time 12   Period Weeks   Status New   Target Date 04/14/17     OT LONG TERM GOAL #7   Title Pt. will independently demonstrate visual compensatory strategies duirng ADLs, and IADLs.   Baseline Glaucoma   Time 12   Period Weeks   Status New   Target Date 04/14/17               Plan - 02/26/17 1612    Clinical Impression Statement Pt. continues to present with limited LUE strength, and coordination skills. Pt. continues to present with edema in th e left hand which is improving. Pt. progressed to a smaller size compression glove.  Pt. continues to work on improving UE strength, and coordination skills for improved engagement in ADLs, and IADL tasks.   Occupational performance deficits (Please refer to evaluation for details): ADL's;IADL's   Rehab Potential Excellent   OT Frequency 2x / week   OT Duration 12 weeks   OT Treatment/Interventions Self-care/ADL training;Therapeutic exercise;Patient/family education;Energy conservation;Therapeutic activities;Manual Therapy;DME and/or AE instruction;Therapeutic exercises;Moist Heat;Passive range of motion;Visual/perceptual  remediation/compensation   Consulted and Agree with Plan of Care Patient      Patient will benefit from skilled therapeutic intervention in order to improve the following deficits and impairments:  Abnormal gait, Pain, Decreased strength, Decreased range of motion, Impaired UE functional use, Decreased coordination, Decreased knowledge of use of DME, Decreased activity tolerance, Difficulty walking, Decreased balance, Other (comment)  Visit Diagnosis: Muscle weakness (generalized)  Other lack of coordination    Problem List Patient Active Problem List   Diagnosis Date Noted  . Left  hemiparesis (HCC)   . Essential hypertension 11/29/2016  . 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 Hepatitis C 07/22/2013    Olegario Messier, MS, OTR/L 02/26/2017, 10:42 PM  [place] [facility] MAIN [facility] SERVICES [address] [place], [place], [address] Phone: [phone]   Fax:  [phone]  Name: Jason Nolan MRN: [id number] Date of Birth: 12-13-1945

## 2017-02-26 NOTE — Therapy (Signed)
[facility] MAIN [facility] SERVICES [address] [place], [place], [address] Phone: [phone]   Fax:  [phone]  Physical Therapy Treatment/Goals Update  Patient Details  Name: Jason Nolan MRN: [id number] Date of Birth: 07-26-1945 Referring Provider: [name]  Encounter Date: 02/26/2017      PT End of Session - 02/27/17 1618    Visit Number 10   Number of Visits 25   Date for PT Re-Evaluation 04/21/17   Authorization Type g code 10/10   PT Start Time 1505   PT Stop Time 1550   PT Time Calculation (min) 45 min   Equipment Utilized During Treatment Gait belt   Activity Tolerance Patient tolerated treatment well   Behavior During Therapy [facility] for tasks assessed/performed      Past Medical History:  Diagnosis Date  . Glaucoma   . Hepatitis C   . Stroke [facility])     Past Surgical History:  Procedure Laterality Date  . IR ANGIO INTRA EXTRACRAN SEL COM CAROTID INNOMINATE UNI L MOD SED  11/25/2016  . IR ANGIO VERTEBRAL SEL SUBCLAVIAN INNOMINATE UNI R MOD SED  11/25/2016  . IR ANGIO VERTEBRAL SEL VERTEBRAL UNI L MOD SED  11/25/2016  . IR INTRAVSC STENT CERV CAROTID W/O EMB-PROT MOD SED INC ANGIO  11/25/2016  . IR PERCUTANEOUS ART THROMBECTOMY/INFUSION INTRACRANIAL INC DIAG ANGIO  11/25/2016  . IR RADIOLOGIST EVAL & MGMT  02/13/2017  . RADIOLOGY WITH ANESTHESIA N/A 11/25/2016   Procedure: RADIOLOGY WITH ANESTHESIA;  Surgeon: Radiologist, Medication, MD;  Location: [facility] OR;  Service: Radiology;  Laterality: N/A;    Vitals:   02/26/17 1508  BP: 129/72  Pulse: 60  SpO2: 100%        Subjective Assessment - 02/27/17 1618    Subjective Pt reports he is doing well today. No specific questions or concerns currently. Denies changes in health or medications since last visit. He is performing HEP. No falls since last therapy session.    Pertinent History Pt. is a 71 y.o. 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  Pt. was transferred to [facility]. He underwent CTA brain showed short segment near occlusion of proximal R-ICA with associated intraluminal thrombus --likely due to dissection and emergent large vessel occlusion with right M2 occlusion and evolving right frontal lobe infarct.  He underwent cerebral angio with  complete revascularization of occluded superior division of R-MCA and near complete revascularization of proximal R-ICA with stent assisted angioplasty.  Stroke felt to be embolic in setting of ruptured plaque R-ICA and he was placed on ASA and brillinta due to stent. Pt. was transferred to inpatient rehab for several weeks, had home health therapy upon discharge.   Limitations Walking;Lifting;House hold activities   How long can you sit comfortably? unlimited    How long can you stand comfortably? unlimited    How long can you walk comfortably? unsure   Patient Stated Goals "strengthen my whole L side"   Currently in Pain? No/denies            [facility] PT Assessment - 02/26/17 1537      Standardized Balance Assessment   Standardized Balance Assessment Berg Balance Test;Five Times Sit to Stand;10 meter walk test   Five times sit to stand comments  16.1s   10 Meter Walk 9.0s = 1.11 m/s     Berg Balance Test   Sit to Stand Able to stand without using hands and stabilize independently  Standing Unsupported Able to stand safely 2 minutes   Sitting with Back Unsupported but Feet Supported on Floor or Stool Able to sit safely and securely 2 minutes   Stand to Sit Sits safely with minimal use of hands   Transfers Able to transfer safely, minor use of hands   Standing Unsupported with Eyes Closed Able to stand 10 seconds safely   Standing Ubsupported with Feet Together Able to place feet together independently and stand 1 minute safely   From Standing, Reach Forward with Outstretched Arm Can reach confidently >25 cm (10")   From Standing Position, Pick up Object from Floor Able to pick up  shoe safely and easily   From Standing Position, Turn to Look Behind Over each Shoulder Looks behind from both sides and weight shifts well   Turn 360 Degrees Able to turn 360 degrees safely in 4 seconds or less   Standing Unsupported, Alternately Place Feet on Step/Stool Able to stand independently and safely and complete 8 steps in 20 seconds   Standing Unsupported, One Foot in Front Able to place foot tandem independently and hold 30 seconds   Standing on One Leg Tries to lift leg/unable to hold 3 seconds but remains standing independently   Total Score 53   Berg comment: >10s on RLe, eventually able to get to 10s on LLE after 4 trials. First three trials are less than 3 seconds. Mild L knee hyperextension noted.      TREATMENT  Ther-ex  Octane fitness L8-10 x 5 min warmup during history, fatigue monitored and resistance adjusted accordingly; Single leg heel raises with UE support on // bars x 10 bilateral; Green tband resisted side stepping in crouched position x multiple lengths in // bars; Wall squats with pball behind back and RLE on 5" step to force increased WB through LLE 30s hold, 30s rest x 3; BOSU (round side up) squats 2 x 10 with intermittent finger touches, notable instability; Quantum single leg press 90# x 15, 105# x 15 on both sides; Updated outcome measures and goals with patient including BERG, 5TSTS, and 64m gait speed;  Pt required cues verbal and demonstration for form and exercise technique. Intermittent finger touches on parallel bars with BOSU squats;                          PT Education - 02/27/17 1618    Education provided Yes   Education Details exercise form/technique   Person(s) Educated Patient   Methods Explanation   Comprehension Verbalized understanding          PT Short Term Goals - 02/26/17 1536      PT SHORT TERM GOAL #1   Title Pt will improve 5x sit to stand to 18 seconds in order to demonstrate improved LE strength  and improved dynamic balance.   Baseline 21 sec; 02/26/17: 16.1s   Time 6   Period Weeks   Status Achieved   Target Date 03/10/17     PT SHORT TERM GOAL #2   Title Pt will improve gait speed to at least 1.0 m/s with proper heel strike and decreased hip hike in order to demonstrate imrpoved LE strength and balance.   Baseline 0.83 m/s; 02/26/17: 1.11 m/s   Time 6   Period Weeks   Status Achieved   Target Date 03/10/17     PT SHORT TERM GOAL #3   Title Pt will improve Berg Balance score to 51/56 in  order to demonstrate improved dynamic and static balance, decreasing overall falls risk.   Baseline 48/56; 02/26/17: 53/56   Time 6   Period Weeks   Status Achieved   Target Date 03/10/17           PT Long Term Goals - 02/26/17 1536      PT LONG TERM GOAL #1   Title Pt will demonstrate independence with HEP in order to continue LE strengthening and manage symptoms.   Time 12   Period Weeks   Status On-going   Target Date 04/21/17     PT LONG TERM GOAL #2   Title Pt will improve 5x sit to stand to <15 seconds in order to demonstrate improved LE strength and dynamic balance.   Baseline 21 sec; 02/26/17: 16.1s   Time 12   Period Weeks   Status On-going   Target Date 04/21/17     PT LONG TERM GOAL #3   Title Pt will demonstrate gait speed of at least 1.2 m/s in order to demonstrate improved LE strength and improved balance in order to return to prior level of funciton.   Baseline 0.833 m/s 02/26/17: 1.11 m/s   Time 12   Period Weeks   Status On-going   Target Date 04/21/17     PT LONG TERM GOAL #4   Title Pt will improve Berg Balance score to at least 54/56 in order demonstrate improved dynamic and static balance, decreasing overall falls risk.   Baseline 48/56; 02/26/17: 53/56   Time 12   Period Weeks   Status On-going   Target Date 04/21/17     PT LONG TERM GOAL #5   Title Pt will demonstrate improved LE strength to 4+/5 in all limited planes in order to return to  prior level of function.   Baseline gross strength: 4/5; 02/26/17: deferred strength testing   Time 12   Period Weeks   Status On-going   Target Date 04/21/17     PT LONG TERM GOAL #6   Title Pt will improve to >1500 feet without using AD in order to return to prior level of function, demonstrating improvements in LE strength, balance and endurance.   Baseline 1205 ft without AD; 02/26/17: deferred to next session   Time 12   Period Weeks   Status On-going   Target Date 04/21/17               Plan - 02/27/17 1619    Clinical Impression Statement Pt demonstrates excellent motivation today with therapy. He is able to tolerate increased challenge with wall squats and leg press. He is very unstable to BOSU squats but is able to complete two sets of ten reps. He demonstrates considerable improvement in his goals, especially his 5TSTS, 45m gait speed, and BERG balance test. and MMT of LEs will be performed at next visit. Pt encouraged to continue HEP. He will benefit from continued PT services to address deficits in strength, balance, and mobility in order to return to full function at home and with leisure activities.    Rehab Potential Good   Clinical Impairments Affecting Rehab Potential high prior level of function   PT Frequency 2x / week   PT Duration 12 weeks   PT Treatment/Interventions ADLs/Self Care Home Management;Cryotherapy;Moist Heat;Gait training;Stair training;Functional mobility training;Therapeutic activities;Therapeutic exercise;Balance training;Neuromuscular re-education;Patient/family education;Manual techniques   PT Next Visit Plan , MMT, update those specific goals, continue strength/balance;   PT Home Exercise Plan Add green theraband  to HEP exercises   Consulted and Agree with Plan of Care Patient      Patient will benefit from skilled therapeutic intervention in order to improve the following deficits and impairments:  Abnormal gait, Decreased  balance, Decreased strength, Difficulty walking, Increased edema  Visit Diagnosis: Muscle weakness (generalized)  Unsteadiness on feet       G-Codes - 02/27/17 1621    Functional Assessment Tool Used (Outpatient Only) 5x sit to stand, [employer] CorporationBerg Balance, 8528m WT, 6MWT, clinical judgement   Functional Limitation Mobility: Walking and moving around   Mobility: Walking and Moving Around Current Status [phone] At least 20 percent but less than 40 percent impaired, limited or restricted   Mobility: Walking and Moving Around Goal Status [phone] At least 20 percent but less than 40 percent impaired, limited or restricted      Problem List Patient Active Problem List   Diagnosis Date Noted  . Left hemiparesis (HCC)   . Essential hypertension 11/29/2016  . 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 Hepatitis C 07/22/2013   Lynnea MaizesJason D Other Atienza PT, DPT   Tarig Zimmers 02/27/2017, 4:26 PM  [place] [facility] MAIN [facility] [address] [place], [place], [address] Phone: [phone]   Fax:  [phone]  Name: Terrilee CroakJames Rawson MRN: [id number] Date of Birth: 03/09/1946

## 2017-03-04 ENCOUNTER — Encounter: Payer: Self-pay | Admitting: Physical Therapy

## 2017-03-04 ENCOUNTER — Ambulatory Visit: Payer: Medicare Other | Admitting: Physical Therapy

## 2017-03-04 ENCOUNTER — Encounter: Payer: Self-pay | Admitting: Speech Pathology

## 2017-03-04 ENCOUNTER — Ambulatory Visit: Payer: Medicare Other | Admitting: Speech Pathology

## 2017-03-04 DIAGNOSIS — R2681 Unsteadiness on feet: Secondary | ICD-10-CM | POA: Diagnosis not present

## 2017-03-04 DIAGNOSIS — R278 Other lack of coordination: Secondary | ICD-10-CM

## 2017-03-04 DIAGNOSIS — R471 Dysarthria and anarthria: Secondary | ICD-10-CM | POA: Diagnosis not present

## 2017-03-04 DIAGNOSIS — I639 Cerebral infarction, unspecified: Secondary | ICD-10-CM | POA: Diagnosis not present

## 2017-03-04 DIAGNOSIS — M6281 Muscle weakness (generalized): Secondary | ICD-10-CM

## 2017-03-04 NOTE — Therapy (Signed)
[place] [facility] MAIN [facility] SERVICES [address] [place], [place], [address] Phone: [phone]   Fax:  [phone]  Speech Language Pathology Treatment  Patient Details  Name: Jason Nolan MRN: [id number] Date of Birth: 1945-11-30 Referring Provider: [name]   Encounter Date: 03/04/2017      End of Session - 03/04/17 1603    Visit Number 4   Number of Visits 7   Date for SLP Re-Evaluation 03/25/17   SLP Start Time 1400   SLP Stop Time  1500   SLP Time Calculation (min) 60 min   Activity Tolerance Patient tolerated treatment well      Past Medical History:  Diagnosis Date  . Glaucoma   . Hepatitis C   . Stroke [facility])     Past Surgical History:  Procedure Laterality Date  . IR ANGIO INTRA EXTRACRAN SEL COM CAROTID INNOMINATE UNI L MOD SED  11/25/2016  . IR ANGIO VERTEBRAL SEL SUBCLAVIAN INNOMINATE UNI R MOD SED  11/25/2016  . IR ANGIO VERTEBRAL SEL VERTEBRAL UNI L MOD SED  11/25/2016  . IR INTRAVSC STENT CERV CAROTID W/O EMB-PROT MOD SED INC ANGIO  11/25/2016  . IR PERCUTANEOUS ART THROMBECTOMY/INFUSION INTRACRANIAL INC DIAG ANGIO  11/25/2016  . IR RADIOLOGIST EVAL & MGMT  02/13/2017  . RADIOLOGY WITH ANESTHESIA N/A 11/25/2016   Procedure: RADIOLOGY WITH ANESTHESIA;  Surgeon: Radiologist, Medication, MD;  Location: [facility] OR;  Service: Radiology;  Laterality: N/A;    There were no vitals filed for this visit.      Subjective Assessment - 03/04/17 1602    Subjective Patient in a pleasant mood and actively participated throughout session; commented "my tongue still feels stiff"   Currently in Pain? 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  The patient received inpatient rehab (including SLP) at [facility] 11/29/2016 - 12/21/2016.  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 He is able express himself and speech is 90% intelligible with supervision needed to self-monitor and correct errors.      Treatment Provided   Treatment provided Cognitive-Linquistic     Pain Assessment   Pain Assessment No/denies pain     Cognitive-Linquistic Treatment   Treatment focused on Dysarthria   Skilled Treatment SPEECH: Practiced speech exercises including long "E" words, exaggerated vowels, exaggerated counting, and exaggerated words and sentences. Practiced articulatory precision exercises (initial and final "ch" and "j" in words and sentences)-judge to be about 90% intelligible overall. r/w minimal pairs- 90% accuracy in /r/ productions for minimal pairs, /r/ words. Intelligibility in reading-about 95%; intelligibility in conversation-90%.       Assessment / Recommendations / Plan   Plan Continue with current plan of care     Progression Toward Goals   Progression toward goals Progressing toward goals          SLP Education - 03/04/17 1602    Education provided Yes   Education Details speak slowly to improve intelligibility    Person(s) Educated Patient   Methods Explanation   Comprehension Verbalized understanding            SLP Long Term Goals - 02/12/17 0929      SLP LONG TERM  GOAL #1   Title Pt will improve oral motor strength and coordination for speech by completing speech based oral motor exercises independently.   Time 6   Period Weeks   Status New   Target Date 03/25/17     SLP LONG TERM GOAL #2   Title Pt will improve speech intelligibility for multi-syllabic words by controlling rate of speech, over-articulation, and increased loudness to achieve 80% intelligibility with min. SLP cues.   Time 6   Period Weeks   Status New   Target Date 03/25/17     SLP LONG TERM GOAL #3   Title Pt will improve speech intelligibility and prosody in  response to moderately complex cognitive linguistic tasks by controlling rate of speech, over-articulation, and increased loudness to achieve 80% or better intelligibility.   Time 6   Period Weeks   Status New   Target Date 03/25/17          Plan - 03/04/17 1603    Clinical Impression Statement The patient demonstrates decreased distorted /r/. He reported that practicing at home helps, particularly looking in the mirror. He demonstrates improved generalization of improved facial ROM into speech.   Speech Therapy Frequency 1x /week   Duration Other (comment)   Treatment/Interventions Oral motor exercises;SLP instruction and feedback;Patient/family education   Potential to Achieve Goals Good   Potential Considerations Ability to learn/carryover information;Co-morbidities;Cooperation/participation level;Medical prognosis;Pain level;Previous level of function;Severity of impairments;Family/community support   SLP Home Exercise Plan long "E" words, exaggerated vowels, exaggerated counting, and exaggerated words and sentences.  ch/j/r words/sentence.  r/w minimal pairs   Consulted and Agree with Plan of Care Patient      Patient will benefit from skilled therapeutic intervention in order to improve the following deficits and impairments:   Dysarthria and anarthria    Problem List Patient Active Problem List   Diagnosis Date Noted  . Left hemiparesis (HCC)   . Essential hypertension 11/29/2016  . 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 Hepatitis C 07/22/2013    Melody Cirrincione [place] 03/04/2017, 4:04 PM  [place] [facility] [address] [place], [place], [address] Phone: [phone]   Fax:  [phone]   Name: Jason Nolan MRN: [id number] Date of Birth: 17-Jan-1946

## 2017-03-04 NOTE — Therapy (Signed)
[place] [facility] MAIN [facility] [address] [place], [place], [address] Phone: [phone]   Fax:  [phone]  Physical Therapy Treatment  Patient Details  Name: Jason Nolan MRN: [id number] Date of Birth: Nov 30, 1945 Referring Provider: Lorre MunroeBAITY, REGINA W  Encounter Date: 03/04/2017      PT End of Session - 03/04/17 1509    Visit Number 11   Number of Visits 25   Date for PT Re-Evaluation 04/21/17   Authorization Type g code 1/10   PT Start Time 1503   PT Stop Time 1545   PT Time Calculation (min) 42 min   Equipment Utilized During Treatment Gait belt   Activity Tolerance Patient tolerated treatment well   Behavior During Therapy [facility] for tasks assessed/performed      Past Medical History:  Diagnosis Date  . Glaucoma   . Hepatitis C   . Stroke [facility](HCC)     Past Surgical History:  Procedure Laterality Date  . IR ANGIO INTRA EXTRACRAN SEL COM CAROTID INNOMINATE UNI L MOD SED  11/25/2016  . IR ANGIO VERTEBRAL SEL SUBCLAVIAN INNOMINATE UNI R MOD SED  11/25/2016  . IR ANGIO VERTEBRAL SEL VERTEBRAL UNI L MOD SED  11/25/2016  . IR INTRAVSC STENT CERV CAROTID W/O EMB-PROT MOD SED INC ANGIO  11/25/2016  . IR PERCUTANEOUS ART THROMBECTOMY/INFUSION INTRACRANIAL INC DIAG ANGIO  11/25/2016  . IR RADIOLOGIST EVAL & MGMT  02/13/2017  . RADIOLOGY WITH ANESTHESIA N/A 11/25/2016   Procedure: RADIOLOGY WITH ANESTHESIA;  Surgeon: Radiologist, Medication, MD;  Location: [facility] OR;  Service: Radiology;  Laterality: N/A;    There were no vitals filed for this visit.      Subjective Assessment - 03/04/17 1508    Subjective Patinet is doing fine today. He has swelling in his left hand. He is not having any pain.    Pertinent History Pt. is a [age] y.o. 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 [facility] He underwent CTA brain showed short segment near occlusion of proximal R-ICA with associated intraluminal  thrombus --likely due to dissection and emergent large vessel occlusion with right M2 occlusion and evolving right frontal lobe infarct.  He underwent cerebral angio with  complete revascularization of occluded superior division of R-MCA and near complete revascularization of proximal R-ICA with stent assisted angioplasty.  Stroke felt to be embolic in setting of ruptured plaque R-ICA and he was placed on ASA and brillinta due to stent. Pt. was transferred to inpatient rehab for several weeks, had home health therapy upon discharge.   Limitations Walking;Lifting;House hold activities   How long can you sit comfortably? unlimited    How long can you stand comfortably? unlimited    How long can you walk comfortably? unsure   Patient Stated Goals "strengthen my whole L side"   Currently in Pain? No/denies   Multiple Pain Sites No      Treatment:  Octane fitness x 5 min for warmup  Hip strengthening with green theraband:                                               Lateral walks x 5 laps in // bars   Marching on BOSU x 20 B LE's - finger touch on // bars for support   Planks in sidelying left side with  minimal height x 15 sec x 2, right sidelying x 30 sec x 3 with good height  LLE SLR with intervals x 10 reps x 2  Fwd lunges into BOSU x 30 B LE's   Lateral lunges into BOSU x 20 B LE's                           Resisted gait with 12.5 lbs x 5 reps backwards walking, 3 reps lateral walking, 3 reps forward walking - minor LOB noted with lateral walking, able to self-correct using postural reactions  Pt required visual and verbal cues for proper technique and form with exercise and exercise progression                           PT Education - 03/04/17 1509    Education provided Yes   Education Details exercise technique   Person(s) Educated Patient   Methods Explanation;Demonstration;Tactile cues   Comprehension Verbalized understanding;Returned  demonstration;Verbal cues required          PT Short Term Goals - 02/26/17 1536      PT SHORT TERM GOAL #1   Title Pt will improve 5x sit to stand to 18 seconds in order to demonstrate improved LE strength and improved dynamic balance.   Baseline 21 sec; 02/26/17: 16.1s   Time 6   Period Weeks   Status Achieved   Target Date 03/10/17     PT SHORT TERM GOAL #2   Title Pt will improve gait speed to at least 1.0 m/s with proper heel strike and decreased hip hike in order to demonstrate imrpoved LE strength and balance.   Baseline 0.83 m/s; 02/26/17: 1.11 m/s   Time 6   Period Weeks   Status Achieved   Target Date 03/10/17     PT SHORT TERM GOAL #3   Title Pt will improve Berg Balance score to 51/56 in order to demonstrate improved dynamic and static balance, decreasing overall falls risk.   Baseline 48/56; 02/26/17: 53/56   Time 6   Period Weeks   Status Achieved   Target Date 03/10/17           PT Long Term Goals - 02/26/17 1536      PT LONG TERM GOAL #1   Title Pt will demonstrate independence with HEP in order to continue LE strengthening and manage symptoms.   Time 12   Period Weeks   Status On-going   Target Date 04/21/17     PT LONG TERM GOAL #2   Title Pt will improve 5x sit to stand to <15 seconds in order to demonstrate improved LE strength and dynamic balance.   Baseline 21 sec; 02/26/17: 16.1s   Time 12   Period Weeks   Status On-going   Target Date 04/21/17     PT LONG TERM GOAL #3   Title Pt will demonstrate gait speed of at least 1.2 m/s in order to demonstrate improved LE strength and improved balance in order to return to prior level of funciton.   Baseline 0.833 m/s 02/26/17: 1.11 m/s   Time 12   Period Weeks   Status On-going   Target Date 04/21/17     PT LONG TERM GOAL #4   Title Pt will improve Berg Balance score to at least 54/56 in order demonstrate improved dynamic and static balance, decreasing overall falls risk.   Baseline 48/56;  02/26/17:  53/56   Time 12   Period Weeks   Status On-going   Target Date 04/21/17     PT LONG TERM GOAL #5   Title Pt will demonstrate improved LE strength to 4+/5 in all limited planes in order to return to prior level of function.   Baseline gross strength: 4/5; 02/26/17: deferred strength testing   Time 12   Period Weeks   Status On-going   Target Date 04/21/17     PT LONG TERM GOAL #6   Title Pt will improve to >1500 feet without using AD in order to return to prior level of function, demonstrating improvements in LE strength, balance and endurance.   Baseline 1205 ft without AD; 02/26/17: deferred to next session   Time 12   Period Weeks   Status On-going   Target Date 04/21/17               Plan - 03/04/17 1526    Clinical Impression Statement Dynamic and static balance interventions continued today, with a focus on unilateral LE stability.  Pt tolerated all exercises well, but required increased rest breaks secondary to increased  LE fatigue and weakness today.  LE strength exercises were progressed today with focus on unilateral strength and stability to focus on increasing LE strength and stability with daily tasks.  Pt was encouraged to perform HEP during the week in order to continue progressing balance and strength interventions.  Pt would continue to benefit from skilled therapy services in order to further address LE strength deficits and balance deficits in order to decrease fall risk and improve mobility.   Rehab Potential Good   Clinical Impairments Affecting Rehab Potential high prior level of function   PT Frequency 2x / week   PT Duration 12 weeks   PT Treatment/Interventions ADLs/Self Care Home Management;Cryotherapy;Moist Heat;Gait training;Stair training;Functional mobility training;Therapeutic activities;Therapeutic exercise;Balance training;Neuromuscular re-education;Patient/family education;Manual techniques   PT Next Visit Plan , MMT, update  those specific goals, continue strength/balance;   PT Home Exercise Plan Add green theraband to HEP exercises   Consulted and Agree with Plan of Care Patient      Patient will benefit from skilled therapeutic intervention in order to improve the following deficits and impairments:  Abnormal gait, Decreased balance, Decreased strength, Difficulty walking, Increased edema  Visit Diagnosis: Muscle weakness (generalized)  Other lack of coordination  Unsteadiness on feet  Dysarthria and anarthria  Acute ischemic stroke (HCC) - 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 Left hemiparesis (HCC)   . Essential hypertension 11/29/2016  . 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 Hepatitis C 07/22/2013    Ezekiel Ina, PT DPT 03/04/2017, 3:41 PM  [place] [name] MAIN [facility] SERVICES [address] [place], [place], [address] Phone: [phone]   Fax:  [phone]  Name: Jason Nolan MRN: [id number] Date of Birth: November 30, 1945

## 2017-03-06 ENCOUNTER — Encounter: Payer: Self-pay | Admitting: Physical Therapy

## 2017-03-06 ENCOUNTER — Ambulatory Visit: Payer: Medicare Other | Attending: Internal Medicine | Admitting: Physical Therapy

## 2017-03-06 ENCOUNTER — Encounter: Payer: Medicare Other | Admitting: Speech Pathology

## 2017-03-06 DIAGNOSIS — R2681 Unsteadiness on feet: Secondary | ICD-10-CM | POA: Diagnosis not present

## 2017-03-06 DIAGNOSIS — I639 Cerebral infarction, unspecified: Secondary | ICD-10-CM | POA: Diagnosis not present

## 2017-03-06 DIAGNOSIS — R278 Other lack of coordination: Secondary | ICD-10-CM | POA: Diagnosis not present

## 2017-03-06 DIAGNOSIS — R471 Dysarthria and anarthria: Secondary | ICD-10-CM | POA: Diagnosis not present

## 2017-03-06 DIAGNOSIS — M6281 Muscle weakness (generalized): Secondary | ICD-10-CM | POA: Insufficient documentation

## 2017-03-06 NOTE — Therapy (Signed)
[place] [facility] MAIN [facility] SERVICES [address] [place], [place], [address] Phone: [phone]   Fax:  [phone]  Physical Therapy Treatment  Patient Details  Name: Jason Nolan MRN: [id number] Date of Birth: 1945-08-24 Referring Provider: [name]  Encounter Date: 03/06/2017      PT End of Session - 03/06/17 1505    Visit Number 12   Number of Visits 25   Date for PT Re-Evaluation 04/21/17   Authorization Type g code 2/10   PT Start Time 1500   PT Stop Time 1545   PT Time Calculation (min) 45 min   Equipment Utilized During Treatment Gait belt   Activity Tolerance Patient tolerated treatment well   Behavior During Therapy [facility] for tasks assessed/performed      Past Medical History:  Diagnosis Date  . Glaucoma   . Hepatitis C   . Stroke [facility])     Past Surgical History:  Procedure Laterality Date  . IR ANGIO INTRA EXTRACRAN SEL COM CAROTID INNOMINATE UNI L MOD SED  11/25/2016  . IR ANGIO VERTEBRAL SEL SUBCLAVIAN INNOMINATE UNI R MOD SED  11/25/2016  . IR ANGIO VERTEBRAL SEL VERTEBRAL UNI L MOD SED  11/25/2016  . IR INTRAVSC STENT CERV CAROTID W/O EMB-PROT MOD SED INC ANGIO  11/25/2016  . IR PERCUTANEOUS ART THROMBECTOMY/INFUSION INTRACRANIAL INC DIAG ANGIO  11/25/2016  . IR RADIOLOGIST EVAL & MGMT  02/13/2017  . RADIOLOGY WITH ANESTHESIA N/A 11/25/2016   Procedure: RADIOLOGY WITH ANESTHESIA;  Surgeon: Radiologist, Medication, MD;  Location: [facility] OR;  Service: Radiology;  Laterality: N/A;    There were no vitals filed for this visit.      Subjective Assessment - 03/06/17 1505    Subjective Patinet is doing fine today. He has swelling in his left hand. He is not having any pain. He is doing his exercises at home.   Pertinent History Pt. is a 71 y.o. 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 Pt. was transferred to [facility]. He underwent CTA brain showed short segment near occlusion of proximal R-ICA  with associated intraluminal thrombus --likely due to dissection and emergent large vessel occlusion with right M2 occlusion and evolving right frontal lobe infarct.  He underwent cerebral angio with  complete revascularization of occluded superior division of R-MCA and near complete revascularization of proximal R-ICA with stent assisted angioplasty.  Stroke felt to be embolic in setting of ruptured plaque R-ICA and he was placed on ASA and brillinta due to stent. Pt. was transferred to inpatient rehab for several weeks, had home health therapy upon discharge.   Limitations Walking;Lifting;House hold activities   How long can you sit comfortably? unlimited    How long can you stand comfortably? unlimited    How long can you walk comfortably? unsure   Patient Stated Goals "strengthen my whole L side"   Currently in Pain? No/denies   Multiple Pain Sites No        Treatment:             Octane fitness x 5 min warmup               Lateral walking in // bars with green theraband x 5 laps              Monster walks with green theraband x 5 laps in // bars  Green theraband:                         Hip flex x 20 B LE                         Hip ABD x 20 B LE                         Hip Ext x 20 B LE              Squats in // bars - chair behind for tactile cues to reach full depth: 10 x 2              Single limb squats 10 x 2 on B LE's - cues for form and proper foot placement              Fwd lunges into BOSU x 20 B LE's - cues for technique               Lateral lunges into BOSU x 20 B LE's - visual cues for proper exercise technique              Core rotation standing on foam beam with narrow stance x 20 with cards as visual cues for reach target              Diagonal core rotation on foam beam in narrow stance with cards as visual targets x 20 each way (high low reach)   Tm walking 1. 5 miles / hour x 5 mins with UE support and cues for  posture. Planks right side 30 sec x 2 , unable to lift off on left side Prone on elbows and knee flex with hip extension BLE x 20 x 2  CGA and Min to mod verbal cues used throughout with increased in postural sway and LOB most seen with narrow base of support and while on uneven surfaces. Continues to have balance deficits typical with diagnosis. Patient performs intermediate level exercises without pain behaviors and needs verbal cuing for postural alignment and head positioning                       PT Education - 03/06/17 1505    Education provided Yes   Education Details exercise technique   Person(s) Educated Patient   Methods Explanation;Tactile cues;Verbal cues   Comprehension Verbalized understanding;Returned demonstration;Verbal cues required          PT Short Term Goals - 02/26/17 1536      PT SHORT TERM GOAL #1   Title Pt will improve 5x sit to stand to 18 seconds in order to demonstrate improved LE strength and improved dynamic balance.   Baseline 21 sec; 02/26/17: 16.1s   Time 6   Period Weeks   Status Achieved   Target Date 03/10/17     PT SHORT TERM GOAL #2   Title Pt will improve gait speed to at least 1.0 m/s with proper heel strike and decreased hip hike in order to demonstrate imrpoved LE strength and balance.   Baseline 0.83 m/s; 02/26/17: 1.11 m/s   Time 6   Period Weeks   Status Achieved   Target Date 03/10/17     PT SHORT TERM GOAL #3   Title Pt will improve Berg Balance score to 51/56 in order to demonstrate improved dynamic and static  balance, decreasing overall falls risk.   Baseline 48/56; 02/26/17: 53/56   Time 6   Period Weeks   Status Achieved   Target Date 03/10/17           PT Long Term Goals - 02/26/17 1536      PT LONG TERM GOAL #1   Title Pt will demonstrate independence with HEP in order to continue LE strengthening and manage symptoms.   Time 12   Period Weeks   Status On-going   Target Date 04/21/17      PT LONG TERM GOAL #2   Title Pt will improve 5x sit to stand to <15 seconds in order to demonstrate improved LE strength and dynamic balance.   Baseline 21 sec; 02/26/17: 16.1s   Time 12   Period Weeks   Status On-going   Target Date 04/21/17     PT LONG TERM GOAL #3   Title Pt will demonstrate gait speed of at least 1.2 m/s in order to demonstrate improved LE strength and improved balance in order to return to prior level of funciton.   Baseline 0.833 m/s 02/26/17: 1.11 m/s   Time 12   Period Weeks   Status On-going   Target Date 04/21/17     PT LONG TERM GOAL #4   Title Pt will improve Berg Balance score to at least 54/56 in order demonstrate improved dynamic and static balance, decreasing overall falls risk.   Baseline 48/56; 02/26/17: 53/56   Time 12   Period Weeks   Status On-going   Target Date 04/21/17     PT LONG TERM GOAL #5   Title Pt will demonstrate improved LE strength to 4+/5 in all limited planes in order to return to prior level of function.   Baseline gross strength: 4/5; 02/26/17: deferred strength testing   Time 12   Period Weeks   Status On-going   Target Date 04/21/17     PT LONG TERM GOAL #6   Title Pt will improve to >1500 feet without using AD in order to return to prior level of function, demonstrating improvements in LE strength, balance and endurance.   Baseline 1205 ft without AD; 02/26/17: deferred to next session   Time 12   Period Weeks   Status On-going   Target Date 04/21/17               Plan - 03/06/17 1506    Clinical Impression Statement Pt was able to progress dynamic balance exercises today, noting improved postural reactions with LOB and moving outside normal BOS.  Pt was able to perform all exercises on uneven surfaces with minimal LOB noted.  Single limb stability activities were added today, with decrease control noted when attempting to perform tasks with the R > L.  Pt would continue to benefit from skilled therapy  services to address further balance impairments and decrease falls risk.   Rehab Potential Good   Clinical Impairments Affecting Rehab Potential high prior level of function   PT Frequency 2x / week   PT Duration 12 weeks   PT Treatment/Interventions ADLs/Self Care Home Management;Cryotherapy;Moist Heat;Gait training;Stair training;Functional mobility training;Therapeutic activities;Therapeutic exercise;Balance training;Neuromuscular re-education;Patient/family education;Manual techniques   PT Next Visit Plan , MMT, update those specific goals, continue strength/balance;   PT Home Exercise Plan Add green theraband to HEP exercises   Consulted and Agree with Plan of Care Patient      Patient will benefit from skilled therapeutic intervention in order to improve  the following deficits and impairments:  Abnormal gait, Decreased balance, Decreased strength, Difficulty walking, Increased edema  Visit Diagnosis: Muscle weakness (generalized)  Other lack of coordination  Unsteadiness on feet     Problem List Patient Active Problem List   Diagnosis Date Noted  . Left hemiparesis (HCC)   . Essential hypertension 11/29/2016  . 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 Hepatitis C 07/22/2013    Jones Broom S,PT DPT 03/06/2017, 3:09 PM  [place] [facility] MAIN [facility] SERVICES [address] [place], [place], [address] Phone: [phone]   Fax:  [phone]  Name: Aadyn Buchheit MRN: [id number] Date of Birth: 01/21/1946

## 2017-03-11 ENCOUNTER — Encounter: Payer: Self-pay | Admitting: Physical Therapy

## 2017-03-11 ENCOUNTER — Encounter: Payer: Self-pay | Admitting: Occupational Therapy

## 2017-03-11 ENCOUNTER — Ambulatory Visit: Payer: Medicare Other | Admitting: Physical Therapy

## 2017-03-11 ENCOUNTER — Ambulatory Visit: Payer: Medicare Other | Admitting: Occupational Therapy

## 2017-03-11 ENCOUNTER — Encounter: Payer: Self-pay | Admitting: Speech Pathology

## 2017-03-11 ENCOUNTER — Ambulatory Visit: Payer: Medicare Other | Admitting: Speech Pathology

## 2017-03-11 ENCOUNTER — Other Ambulatory Visit: Payer: Self-pay

## 2017-03-11 DIAGNOSIS — I639 Cerebral infarction, unspecified: Secondary | ICD-10-CM | POA: Diagnosis not present

## 2017-03-11 DIAGNOSIS — R278 Other lack of coordination: Secondary | ICD-10-CM

## 2017-03-11 DIAGNOSIS — R471 Dysarthria and anarthria: Secondary | ICD-10-CM | POA: Diagnosis not present

## 2017-03-11 DIAGNOSIS — R2681 Unsteadiness on feet: Secondary | ICD-10-CM | POA: Diagnosis not present

## 2017-03-11 DIAGNOSIS — M6281 Muscle weakness (generalized): Secondary | ICD-10-CM

## 2017-03-11 NOTE — Therapy (Signed)
[place] [facility] MAIN [facility] [address] [place], [place], [address] Phone: [phone]   Fax:  [phone]  Occupational Therapy Treatment  Patient Details  Name: Jason CroakJames Nolan MRN: [id number] Date of Birth: 1945-10-06 Referring Provider: Dr. Wynn BankerKirsteins   Encounter Date: 03/11/2017  OT End of Session - 03/11/17 1638    Visit Number  7    Number of Visits  24    Date for OT Re-Evaluation  04/14/17    Authorization Type  Medicare G-Code 7 of 10    OT Start Time  1400    OT Stop Time  1445    OT Time Calculation (min)  45 min    Activity Tolerance  Patient tolerated treatment well    Behavior During Therapy  [facility] for tasks assessed/performed       Past Medical History:  Diagnosis Date  . Glaucoma   . Hepatitis C   . Stroke [facility](HCC)     Past Surgical History:  Procedure Laterality Date  . IR ANGIO INTRA EXTRACRAN SEL COM CAROTID INNOMINATE UNI L MOD SED  11/25/2016  . IR ANGIO VERTEBRAL SEL SUBCLAVIAN INNOMINATE UNI R MOD SED  11/25/2016  . IR ANGIO VERTEBRAL SEL VERTEBRAL UNI L MOD SED  11/25/2016  . IR INTRAVSC STENT CERV CAROTID W/O EMB-PROT MOD SED INC ANGIO  11/25/2016  . IR PERCUTANEOUS ART THROMBECTOMY/INFUSION INTRACRANIAL INC DIAG ANGIO  11/25/2016  . IR RADIOLOGIST EVAL & MGMT  02/13/2017    There were no vitals filed for this visit.  Subjective Assessment - 03/11/17 1636    Subjective   Pt. reports [place] to go to [place].    Patient is accompained by:  Family member    Pertinent History  Pt. is a 71 y.o. 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 [facility] He underwent CTA brain showed short segment near occlusion of proximal R-ICA with associated intraluminal thrombus --likely due to dissection and emergent large vessel occlusion with right M2 occlusion and evolving right frontal lobe infarct.  He underwent cerebral angio with  complete revascularization of occluded superior  division of R-MCA and near complete revascularization of proximal R-ICA with stent assisted angioplasty.  Stroke felt to be embolic in setting of ruptured plaque R-ICA and he was placed on ASA and brillinta due to stent. Pt. was transferred to inpatient rehab for several weeks, had home health therapy upon discharge, and now is ready for outpatient therapy services.    Limitations  Dominant LUE functioning, left hand edema, distal ROM, coordination, and overal strength.    Currently in Pain?  No/denies       OT TREATMENT    Therapeutic Exercise:  Pt. worked on the [employer] for 8 min. With constant monitoring of the BUEs. Pt. worked on changing, and alternating forward reverse position every 2 min. rest breaks were required. Pt. performed gross gripping with grip strengthener. Pt. worked on sustaining grip while grasping pegs and reaching at various heights. Gripper was placed in the 1st, and 2nd resistive slot with the white resistive spring. Pt. Worked on pinch strengthening in the left hand for lateral, and 3pt. pinch using yellow, and red clips. Pt. worked on placing the clips at various vertical and horizontal angles. Tactile and verbal cues were required for eliciting the desired movement. Pt. performed resistive EZ Board exercises for forearm supination/pronation, wrist flexion/extension using gross grasp, and lateral pinch (key) grasp. Pt. performed resistive  EZ Board exercises angled in several planes to promote shoulder flexion, abduction, and wrist flexion, and extension while performing resistive wrist flexion and extension with a gross grip.                       OT Education - 03/11/17 1637    Education provided  Yes    Education Details  LUE strengthening.    Person(s) Educated  Patient    Methods  Explanation    Comprehension  Verbalized understanding          OT Long Term Goals - 01/20/17 1741      OT LONG TERM GOAL #1   Title  Pt. will improve edema by 2  cm through MPs in preparation for ROM.    Baseline  wrist: 20 cm, MPs: 25.5    Time  12    Period  Weeks    Status  New    Target Date  04/14/17      OT LONG TERM GOAL #2   Title  Pt. will improve Left hand digit extension in preparation for weightbearing, and releasing objects during ADLs/IADLs.    Baseline  Pt. is unable to achieve full digit extension on the left.    Time  12    Period  Weeks    Status  New    Target Date  04/14/17      OT LONG TERM GOAL #3   Title  Pt. left hand grip with  increase by 5# in preparation for holding a cup.    Baseline  Left 2#, RIght: 60    Time  12    Period  Weeks    Status  New    Target Date  04/14/17      OT LONG TERM GOAL #4   Title  Pt. will increased right shoulder strength by 1 mm grade to assist with ADLs.    Baseline  Impaired LUE strength    Time  12    Period  Weeks    Status  New    Target Date  04/14/17      OT LONG TERM GOAL #5   Title  Pt. will increase left wrist extension in preparation for reaching ADL items.    Baseline  wrist extension 28 degrees    Time  12    Period  Weeks    Status  New    Target Date  04/14/17      OT LONG TERM GOAL #6   Title  Pt. will improve left hand coordination skills to be able to button clothing.    Baseline  Pt. is unable    Time  12    Period  Weeks    Status  New    Target Date  04/14/17      OT LONG TERM GOAL #7   Title  Pt. will independently demonstrate visual compensatory strategies duirng ADLs, and IADLs.    Baseline  Glaucoma    Time  12    Period  Weeks    Status  New    Target Date  04/14/17            Plan - 03/11/17 1639    Clinical Impression Statement  Pt. continues to present with edema through his left hand, however edema has improved. Pt. reports he, and his wife have been working with it. Pt. continues to work on improving UE functioning for improved use during ADLs,  and IADLs.      Occupational performance deficits (Please refer to evaluation for  details):  ADL's;IADL's    Rehab Potential  Excellent    OT Frequency  2x / week    OT Duration  12 weeks    OT Treatment/Interventions  Self-care/ADL training;Therapeutic exercise;Patient/family education;Energy conservation;Therapeutic activities;Manual Therapy;DME and/or AE instruction;Therapeutic exercises;Moist Heat;Passive range of motion;Visual/perceptual remediation/compensation    Consulted and Agree with Plan of Care  Patient       Patient will benefit from skilled therapeutic intervention in order to improve the following deficits and impairments:  Abnormal gait, Pain, Decreased strength, Decreased range of motion, Impaired UE functional use, Decreased coordination, Decreased knowledge of use of DME, Decreased activity tolerance, Difficulty walking, Decreased balance, Other (comment)  Visit Diagnosis: Muscle weakness (generalized)  Other lack of coordination    Problem List Patient Active Problem List   Diagnosis Date Noted  . Left hemiparesis (HCC)   . Essential hypertension 11/29/2016  . 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 Hepatitis C 07/22/2013    Olegario Messier, MS, OTR/L 03/11/2017, 4:49 PM   [address] MAIN [facility] SERVICES [address] [place], [place], [address] Phone: [phone]   Fax:  [phone]  Name: Jason Nolan MRN: [id number] Date of Birth: 11-01-1945

## 2017-03-11 NOTE — Therapy (Signed)
[place] [facility] [address] [place], [place], [address] Phone: [phone]   Fax:  [phone]  Physical Therapy Treatment  Patient Details  Name: Jason Nolan MRN: [id number] Date of Birth: July 12, 1945 Referring Provider: Lorre MunroeBAITY, REGINA W   Encounter Date: 03/11/2017  PT End of Session - 03/11/17 1316    Visit Number  13    Number of Visits  25    Date for PT Re-Evaluation  04/21/17    Authorization Type  g code 3/10    PT Start Time  0110    PT Stop Time  0200    PT Time Calculation (min)  50 min    Equipment Utilized During Treatment  Gait belt    Activity Tolerance  Patient tolerated treatment well    Behavior During Therapy  [facility] for tasks assessed/performed       Past Medical History:  Diagnosis Date  . Glaucoma   . Hepatitis C   . Stroke [facility](HCC)     Past Surgical History:  Procedure Laterality Date  . IR ANGIO INTRA EXTRACRAN SEL COM CAROTID INNOMINATE UNI L MOD SED  11/25/2016  . IR ANGIO VERTEBRAL SEL SUBCLAVIAN INNOMINATE UNI R MOD SED  11/25/2016  . IR ANGIO VERTEBRAL SEL VERTEBRAL UNI L MOD SED  11/25/2016  . IR INTRAVSC STENT CERV CAROTID W/O EMB-PROT MOD SED INC ANGIO  11/25/2016  . IR PERCUTANEOUS ART THROMBECTOMY/INFUSION INTRACRANIAL INC DIAG ANGIO  11/25/2016  . IR RADIOLOGIST EVAL & MGMT  02/13/2017    There were no vitals filed for this visit.  Subjective Assessment - 03/11/17 1315    Subjective  Patinet is doing fine today. He has swelling in his left hand. He is not having any pain. He is doing his exercises at home.    Pertinent History  Pt. is a 71 y.o. 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 [facility] He underwent CTA brain showed short segment near occlusion of proximal R-ICA with associated intraluminal thrombus --likely due to dissection and emergent large vessel occlusion with right M2 occlusion and evolving right frontal lobe infarct.  He  underwent cerebral angio with  complete revascularization of occluded superior division of R-MCA and near complete revascularization of proximal R-ICA with stent assisted angioplasty.  Stroke felt to be embolic in setting of ruptured plaque R-ICA and he was placed on ASA and brillinta due to stent. Pt. was transferred to inpatient rehab for several weeks, had home health therapy upon discharge.    Limitations  Walking;Lifting;House hold activities    How long can you sit comfortably?  unlimited     How long can you stand comfortably?  unlimited     How long can you walk comfortably?  unsure    Patient Stated Goals  "strengthen my whole L side"    Currently in Pain?  No/denies    Pain Score  0-No pain    Multiple Pain Sites  No       Treatment:   TM  x 5 min for warmup              Hip strengthening with green theraband:                         ABD x 30 B LE's  Ext x 30 B LE's                         Flex x 30 B LE's                          Lateral walks x 5 laps in // bars                         Monster walks x 5 laps in // bars   Marching on BOSU x 20 B LE's - finger touch on // bars for support               BAPs board fwd/bwd, side to side,  CCW and CW x 20   Standing with knee terminal extension with GTB x 15  Neuromuscular:  1/2 foam and head turns, flat side up x 20  Tandem stand flat side up , head turns                           Resisted gait with 12.5 lbs, stepping up ove a box and back  x 5 reps backwards walking, 3 reps lateral walking, 3 reps forward walking - minor LOB noted with lateral walking, able to self-correct using postural reactions  Pt required visual and verbal cues for proper technique and form with exercise and exercise progression.                        PT Education - 03/11/17 1315    Education provided  Yes    Education Details  HEP    Person(s) Educated  Patient    Methods   Explanation;Demonstration    Comprehension  Verbalized understanding;Returned demonstration;Verbal cues required       PT Short Term Goals - 02/26/17 1536      PT SHORT TERM GOAL #1   Title  Pt will improve 5x sit to stand to 18 seconds in order to demonstrate improved LE strength and improved dynamic balance.    Baseline  21 sec; 02/26/17: 16.1s    Time  6    Period  Weeks    Status  Achieved    Target Date  03/10/17      PT SHORT TERM GOAL #2   Title  Pt will improve gait speed to at least 1.0 m/s with proper heel strike and decreased hip hike in order to demonstrate imrpoved LE strength and balance.    Baseline  0.83 m/s; 02/26/17: 1.11 m/s    Time  6    Period  Weeks    Status  Achieved    Target Date  03/10/17      PT SHORT TERM GOAL #3   Title  Pt will improve Berg Balance score to 51/56 in order to demonstrate improved dynamic and static balance, decreasing overall falls risk.    Baseline  48/56; 02/26/17: 53/56    Time  6    Period  Weeks    Status  Achieved    Target Date  03/10/17        PT Long Term Goals - 02/26/17 1536      PT LONG TERM GOAL #1   Title  Pt will demonstrate independence with HEP in order to continue LE strengthening and manage symptoms.    Time  12  Period  Weeks    Status  On-going    Target Date  04/21/17      PT LONG TERM GOAL #2   Title  Pt will improve 5x sit to stand to <15 seconds in order to demonstrate improved LE strength and dynamic balance.    Baseline  21 sec; 02/26/17: 16.1s    Time  12    Period  Weeks    Status  On-going    Target Date  04/21/17      PT LONG TERM GOAL #3   Title  Pt will demonstrate gait speed of at least 1.2 m/s in order to demonstrate improved LE strength and improved balance in order to return to prior level of funciton.    Baseline  0.833 m/s 02/26/17: 1.11 m/s    Time  12    Period  Weeks    Status  On-going    Target Date  04/21/17      PT LONG TERM GOAL #4   Title  Pt will improve Berg  Balance score to at least 54/56 in order demonstrate improved dynamic and static balance, decreasing overall falls risk.    Baseline  48/56; 02/26/17: 53/56    Time  12    Period  Weeks    Status  On-going    Target Date  04/21/17      PT LONG TERM GOAL #5   Title  Pt will demonstrate improved LE strength to 4+/5 in all limited planes in order to return to prior level of function.    Baseline  gross strength: 4/5; 02/26/17: deferred strength testing    Time  12    Period  Weeks    Status  On-going    Target Date  04/21/17      PT LONG TERM GOAL #6   Title  Pt will improve to >1500 feet without using AD in order to return to prior level of function, demonstrating improvements in LE strength, balance and endurance.    Baseline  1205 ft without AD; 02/26/17: deferred to next session    Time  12    Period  Weeks    Status  On-going    Target Date  04/21/17            Plan - 03/11/17 1316    Clinical Impression Statement  LE strength and stability exercises were a focus during today's session, utilizing both open kinetic and closed kinetic chain activities.  Pt shows improvements with single limb stability, with deficits still noted greater on L LE compared to R LE.  Pt presents with improved gait pattern, noting less L foot drop and decreased toe drag during ambulation from one activity to the next.  Pt would continue to benefit from skilled services in order to address further LE strength and stability deficits in order to decrease falls risk, improve functional mobility    Rehab Potential  Good    Clinical Impairments Affecting Rehab Potential  high prior level of function    PT Frequency  2x / week    PT Duration  12 weeks    PT Treatment/Interventions  ADLs/Self Care Home Management;Cryotherapy;Moist Heat;Gait training;Stair training;Functional mobility training;Therapeutic activities;Therapeutic exercise;Balance training;Neuromuscular re-education;Patient/family  education;Manual techniques    PT Next Visit Plan  , MMT, update those specific goals, continue strength/balance;    PT Home Exercise Plan  Add green theraband to HEP exercises    Consulted and Agree with Plan of Care  Patient  Patient will benefit from skilled therapeutic intervention in order to improve the following deficits and impairments:  Abnormal gait, Decreased balance, Decreased strength, Difficulty walking, Increased edema  Visit Diagnosis: Muscle weakness (generalized)  Other lack of coordination  Unsteadiness on feet     Problem List Patient Active Problem List   Diagnosis Date Noted  . Left hemiparesis (HCC)   . Essential hypertension 11/29/2016  . 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 Hepatitis C 07/22/2013    Ezekiel InaMansfield, Niraj Kudrna S, PT DPT 03/11/2017, 1:21 PM  [place] [facility] MAIN [facility] [address] [place], [place], [address] Phone: [phone]   Fax:  [phone]  Name: Jason Nolan MRN: [id number] Date of Birth: March 03, 1946

## 2017-03-11 NOTE — Therapy (Signed)
[place] [facility] [address] [place], [place], [address] Phone: [phone]   Fax:  [phone]  Speech Language Pathology Treatment  Patient Details  Name: Jason CroakJames Nolan MRN: [id number] Date of Birth: [birth year] Referring Provider: Lorre MunroeBAITY, REGINA W    Encounter Date: 03/11/2017  End of Session - 03/11/17 1555    Visit Number  5    Number of Visits  7    Date for SLP Re-Evaluation  03/25/17    SLP Start Time  1500    SLP Stop Time   1550    SLP Time Calculation (min)  50 min    Activity Tolerance  Patient tolerated treatment well       Past Medical History:  Diagnosis Date  . Glaucoma   . Hepatitis C   . Stroke [facility](HCC)     Past Surgical History:  Procedure Laterality Date  . IR ANGIO INTRA EXTRACRAN SEL COM CAROTID INNOMINATE UNI L MOD SED  11/25/2016  . IR ANGIO VERTEBRAL SEL SUBCLAVIAN INNOMINATE UNI R MOD SED  11/25/2016  . IR ANGIO VERTEBRAL SEL VERTEBRAL UNI L MOD SED  11/25/2016  . IR INTRAVSC STENT CERV CAROTID W/O EMB-PROT MOD SED INC ANGIO  11/25/2016  . IR PERCUTANEOUS ART THROMBECTOMY/INFUSION INTRACRANIAL INC DIAG ANGIO  11/25/2016  . IR RADIOLOGIST EVAL & MGMT  02/13/2017    There were no vitals filed for this visit.  Subjective Assessment - 03/11/17 1554    Subjective  Patient in pleasant mood and willingly participated throughout session    Currently in Pain?  No/denies            ADULT SLP TREATMENT - 03/11/17 0001      General Information   Behavior/Cognition  Alert;Cooperative;Pleasant mood    HPI  71 year old man, S/P right MCA CVA on 11/25/2016.  The patient [facility] (including SLP[facility] - 12/21/2016.  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 He is able express himself and speech is 90% intelligible with supervision needed to self-monitor and correct  errors.       Treatment Provided   Treatment provided  Cognitive-Linquistic      Pain Assessment   Pain Assessment  No/denies pain      Cognitive-Linquistic Treatment   Treatment focused on  Dysarthria    Skilled Treatment  SPEECH: Practiced speech exercises including r/w minimal pairs- 95% accuracy in /r/ productions for minimal pairs and /r/ words in all positions. Intelligibility in reading-about 95%; intelligibility in conversation-95%. Patient reports doing well and if he "stumbles" over a word, he goes back and says it again more clearly.       Assessment / Recommendations / Plan   Plan  Continue with current plan of care      Progression Toward Goals   Progression toward goals  Progressing toward goals       SLP Education - 03/11/17 1554    Education provided  Yes    Education Details  slow and loud helps with intelligibility     Person(s) Educated  Patient    Methods  Explanation    Comprehension  Verbalized understanding         SLP Long Term Goals - 02/12/17 0929      SLP LONG TERM GOAL #1   Title  Pt will improve oral motor strength and coordination for speech by completing  speech based oral motor exercises independently.    Time  6    Period  Weeks    Status  New    Target Date  03/25/17      SLP LONG TERM GOAL #2   Title  Pt will improve speech intelligibility for multi-syllabic words by controlling rate of speech, over-articulation, and increased loudness to achieve 80% intelligibility with min. SLP cues.    Time  6    Period  Weeks    Status  New    Target Date  03/25/17      SLP LONG TERM GOAL #3   Title  Pt will improve speech intelligibility and prosody in response to moderately complex cognitive linguistic tasks by controlling rate of speech, over-articulation, and increased loudness to achieve 80% or better intelligibility.    Time  6    Period  Weeks    Status  New    Target Date  03/25/17       Plan - 03/11/17 1555    Clinical Impression  Statement  The patient demonstrates decreased distorted /r/ and increased overall intelligibility. He reported that practicing at home helps, and repeating a word if he has difficulty with it.     Speech Therapy Frequency  1x /week    Duration  Other (comment)    Treatment/Interventions  Oral motor exercises;SLP instruction and feedback;Patient/family education    Potential to Achieve Goals  Good    Potential Considerations  Ability to learn/carryover information;Co-morbidities;Cooperation/participation level;Medical prognosis;Pain level;Previous level of function;Severity of impairments;Family/community support    SLP Home Exercise Plan  continue practicing exercises sent home in previous sessions     Consulted and Agree with Plan of Care  Patient       Patient will benefit from skilled therapeutic intervention in order to improve the following deficits and impairments:   Dysarthria and anarthria    Problem List Patient Active Problem List   Diagnosis Date Noted  . Left hemiparesis (HCC)   . Essential hypertension 11/29/2016  . 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 Hepatitis C 07/22/2013    Michaela Broski [place] 03/11/2017, 3:56 PM  [place] [facility] MAIN [facility] [address] [place], [place], [address] Phone: [phone]   Fax:  [phone]   Name: Jason CroakJames Nolan MRN: [id number] Date of Birth: 10-01-1945

## 2017-03-12 ENCOUNTER — Other Ambulatory Visit: Payer: Self-pay

## 2017-03-12 NOTE — Patient Outreach (Signed)
Telephone outreach to patient to obtain mRS was successfully completed. mRS = 1 

## 2017-03-13 ENCOUNTER — Encounter: Payer: Medicare Other | Admitting: Speech Pathology

## 2017-03-13 ENCOUNTER — Ambulatory Visit: Payer: Medicare Other | Admitting: Physical Therapy

## 2017-03-13 ENCOUNTER — Encounter: Payer: Self-pay | Admitting: Physical Therapy

## 2017-03-13 ENCOUNTER — Ambulatory Visit: Payer: Medicare Other | Admitting: Occupational Therapy

## 2017-03-13 DIAGNOSIS — I639 Cerebral infarction, unspecified: Secondary | ICD-10-CM | POA: Diagnosis not present

## 2017-03-13 DIAGNOSIS — M6281 Muscle weakness (generalized): Secondary | ICD-10-CM

## 2017-03-13 DIAGNOSIS — R2681 Unsteadiness on feet: Secondary | ICD-10-CM | POA: Diagnosis not present

## 2017-03-13 DIAGNOSIS — R471 Dysarthria and anarthria: Secondary | ICD-10-CM | POA: Diagnosis not present

## 2017-03-13 DIAGNOSIS — R278 Other lack of coordination: Secondary | ICD-10-CM | POA: Diagnosis not present

## 2017-03-13 NOTE — Therapy (Signed)
[facility] MAIN [facility] SERVICES [address] [place], [place], [address] Phone: [phone]   Fax:  [phone]  Physical Therapy Treatment  Patient Details  Name: Jason Nolan MRN: [id number] Date of Birth: [birth year] Referring Provider: [name]   Encounter Date: 03/13/2017  PT End of Session - 03/13/17 1312    Visit Number  14    Number of Visits  25    Date for PT Re-Evaluation  04/21/17    Authorization Type  g code 4/10    PT Start Time  0110    PT Stop Time  0150    PT Time Calculation (min)  40 min    Equipment Utilized During Treatment  Gait belt    Activity Tolerance  Patient tolerated treatment well    Behavior During Therapy  [facility] for tasks assessed/performed       Past Medical History:  Diagnosis Date  . Glaucoma   . Hepatitis C   . Stroke [facility])     Past Surgical History:  Procedure Laterality Date  . IR ANGIO INTRA EXTRACRAN SEL COM CAROTID INNOMINATE UNI L MOD SED  11/25/2016  . IR ANGIO VERTEBRAL SEL SUBCLAVIAN INNOMINATE UNI R MOD SED  11/25/2016  . IR ANGIO VERTEBRAL SEL VERTEBRAL UNI L MOD SED  11/25/2016  . IR INTRAVSC STENT CERV CAROTID W/O EMB-PROT MOD SED INC ANGIO  11/25/2016  . IR PERCUTANEOUS ART THROMBECTOMY/INFUSION INTRACRANIAL INC DIAG ANGIO  11/25/2016  . IR RADIOLOGIST EVAL & MGMT  02/13/2017    There were no vitals filed for this visit.  Subjective Assessment - 03/13/17 1311    Subjective  Patinet is doing fine today. He has swelling in his left hand. He is not having any pain. He is doing his exercises at home. He is getting ready for a trip to [place].     Pertinent History  Pt. is a 71 y.o. 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 Pt. was transferred to [facility]. He underwent CTA brain showed short segment near occlusion of proximal R-ICA with associated intraluminal thrombus --likely due to dissection and emergent large vessel occlusion with right M2 occlusion  and evolving right frontal lobe infarct.  He underwent cerebral angio with  complete revascularization of occluded superior division of R-MCA and near complete revascularization of proximal R-ICA with stent assisted angioplasty.  Stroke felt to be embolic in setting of ruptured plaque R-ICA and he was placed on ASA and brillinta due to stent. Pt. was transferred to inpatient rehab for several weeks, had home health therapy upon discharge.    Limitations  Walking;Lifting;House hold activities    How long can you sit comfortably?  unlimited     How long can you stand comfortably?  unlimited     How long can you walk comfortably?  unsure    Patient Stated Goals  "strengthen my whole L side"    Currently in Pain?  No/denies    Pain Score  0-No pain    Multiple Pain Sites  No       Treatment:  [employer] fitness  x 5 min for warmup  lunges to BOSU x 15 BLe  Squats on BOSU ball x 15 no UE support    Rocker board fwd/ bwd, side to side to side x 20     Neuromuscular:  1/2 foam and head turns, flat side up x 20  Tandem stand flat side up , head turns  Resisted gait with 12.5 lbs, stepping up ove a box with red mat  and back  x 5 reps backwards walking, 3 reps lateral walking, 3 reps forward walking - minor LOB noted with lateral walking, able to self-correct using postural reactions  Pt required visual and verbal cues for proper technique and form with exercise and exercise progression.                       PT Education - 03/13/17 1311    Education provided  Yes    Education Details  exercise techniques    Person(s) Educated  Patient    Methods  Explanation    Comprehension  Verbalized understanding       PT Short Term Goals - 02/26/17 1536      PT SHORT TERM GOAL #1   Title  Pt will improve 5x sit to stand to 18 seconds in order to demonstrate improved LE strength and improved dynamic balance.    Baseline  21 sec; 02/26/17: 16.1s     Time  6    Period  Weeks    Status  Achieved    Target Date  03/10/17      PT SHORT TERM GOAL #2   Title  Pt will improve gait speed to at least 1.0 m/s with proper heel strike and decreased hip hike in order to demonstrate imrpoved LE strength and balance.    Baseline  0.83 m/s; 02/26/17: 1.11 m/s    Time  6    Period  Weeks    Status  Achieved    Target Date  03/10/17      PT SHORT TERM GOAL #3   Title  Pt will improve Berg Balance score to 51/56 in order to demonstrate improved dynamic and static balance, decreasing overall falls risk.    Baseline  48/56; 02/26/17: 53/56    Time  6    Period  Weeks    Status  Achieved    Target Date  03/10/17        PT Long Term Goals - 02/26/17 1536      PT LONG TERM GOAL #1   Title  Pt will demonstrate independence with HEP in order to continue LE strengthening and manage symptoms.    Time  12    Period  Weeks    Status  On-going    Target Date  04/21/17      PT LONG TERM GOAL #2   Title  Pt will improve 5x sit to stand to <15 seconds in order to demonstrate improved LE strength and dynamic balance.    Baseline  21 sec; 02/26/17: 16.1s    Time  12    Period  Weeks    Status  On-going    Target Date  04/21/17      PT LONG TERM GOAL #3   Title  Pt will demonstrate gait speed of at least 1.2 m/s in order to demonstrate improved LE strength and improved balance in order to return to prior level of funciton.    Baseline  0.833 m/s 02/26/17: 1.11 m/s    Time  12    Period  Weeks    Status  On-going    Target Date  04/21/17      PT LONG TERM GOAL #4   Title  Pt will improve Berg Balance score to at least 54/56 in order demonstrate improved dynamic and static balance, decreasing overall falls risk.  Baseline  48/56; 02/26/17: 53/56    Time  12    Period  Weeks    Status  On-going    Target Date  04/21/17      PT LONG TERM GOAL #5   Title  Pt will demonstrate improved LE strength to 4+/5 in all limited planes in order to return  to prior level of function.    Baseline  gross strength: 4/5; 02/26/17: deferred strength testing    Time  12    Period  Weeks    Status  On-going    Target Date  04/21/17      PT LONG TERM GOAL #6   Title  Pt will improve 6MWT to >1500 feet without using AD in order to return to prior level of function, demonstrating improvements in LE strength, balance and endurance.    Baseline  1205 ft without AD; 02/26/17: deferred to next session    Time  12    Period  Weeks    Status  On-going    Target Date  04/21/17            Plan - 03/13/17 1313    Clinical Impression Statement  Patient demonstrates good postural control in tandem and narrow stance on purple foam with EO and EC. Patient demonstrated hesitation with full weight bearing during lunge but minimal cueing resulted in good technique and no LOB.  Patient improved  ability to ascend/descend stairs with supervision and without UE assist today.  Patient shows good BLE strength and hip control during DL leg press. Patient will continue to benefit from skilled physical therapy to improve endurance and dynamic balance to reduce fall risk.    Rehab Potential  Good    Clinical Impairments Affecting Rehab Potential  high prior level of function    PT Frequency  2x / week    PT Duration  12 weeks    PT Treatment/Interventions  ADLs/Self Care Home Management;Cryotherapy;Moist Heat;Gait training;Stair training;Functional mobility training;Therapeutic activities;Therapeutic exercise;Balance training;Neuromuscular re-education;Patient/family education;Manual techniques    PT Next Visit Plan  6MWT, MMT, update those specific goals, continue strength/balance;    PT Home Exercise Plan  Add green theraband to HEP exercises    Consulted and Agree with Plan of Care  Patient       Patient will benefit from skilled therapeutic intervention in order to improve the following deficits and impairments:  Abnormal gait, Decreased balance, Decreased  strength, Difficulty walking, Increased edema  Visit Diagnosis: Dysarthria and anarthria  Muscle weakness (generalized)  Other lack of coordination     Problem List Patient Active Problem List   Diagnosis Date Noted  . Left hemiparesis (HCC)   . Essential hypertension 11/29/2016  . 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 Hepatitis C 07/22/2013    Ezekiel InaMansfield, Kristine S, [place]PT DPT 03/13/2017, 1:52 PM  [place] [facility] [address] [place], [place], [address] Phone: [phone]   Fax:  [phone]  Name: Terrilee CroakJames Corniel MRN: [id number] Date of Birth: 01/04/1946

## 2017-03-13 NOTE — Therapy (Signed)
[facility] [address] [place], [place], [address] Phone: [phone]   Fax:  [phone]  Occupational Therapy Treatment  Patient Details  Name: Jason CroakJames Nolan MRN: [id number] Date of Birth: 05-Oct-1945 Referring Provider: Dr. Wynn BankerKirsteins   Encounter Date: 03/13/2017  OT End of Session - 03/13/17 1458    Visit Number  8    Number of Visits  24    Date for OT Re-Evaluation  04/14/17    Authorization Type  Medicare G-Code 8 of 10    OT Start Time  1400    OT Stop Time  1445    OT Time Calculation (min)  45 min    Activity Tolerance  Patient tolerated treatment well    Behavior During Therapy  [facility] for tasks assessed/performed       Past Medical History:  Diagnosis Date  . Glaucoma   . Hepatitis C   . Stroke [facility](HCC)     Past Surgical History:  Procedure Laterality Date  . IR ANGIO INTRA EXTRACRAN SEL COM CAROTID INNOMINATE UNI L MOD SED  11/25/2016  . IR ANGIO VERTEBRAL SEL SUBCLAVIAN INNOMINATE UNI R MOD SED  11/25/2016  . IR ANGIO VERTEBRAL SEL VERTEBRAL UNI L MOD SED  11/25/2016  . IR INTRAVSC STENT CERV CAROTID W/O EMB-PROT MOD SED INC ANGIO  11/25/2016  . IR PERCUTANEOUS ART THROMBECTOMY/INFUSION INTRACRANIAL INC DIAG ANGIO  11/25/2016  . IR RADIOLOGIST EVAL & MGMT  02/13/2017    There were no vitals filed for this visit.  Subjective Assessment - 03/13/17 1456    Subjective   [place]. [place].    Pertinent History  Pt. is a 71 y.o. 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 [facility] He underwent CTA brain showed short segment near occlusion of proximal R-ICA with associated intraluminal thrombus --likely due to dissection and emergent large vessel occlusion with right M2 occlusion and evolving right frontal lobe infarct.  He underwent cerebral angio with  complete revascularization of occluded superior division of R-MCA and near complete  revascularization of proximal R-ICA with stent assisted angioplasty.  Stroke felt to be embolic in setting of ruptured plaque R-ICA and he was placed on ASA and brillinta due to stent. Pt. was transferred to inpatient rehab for several weeks, had home health therapy upon discharge, and now is ready for outpatient therapy services.    Currently in Pain?  No/denies       OT TREATMENT    Neuro muscular re-education:  Pt. worked on grasping one inch resistive cubes alternating thumb opposition to the tip of the 2nd digit. The board was positioned at a vertical angle to encourage wrist extension. Pt. worked on pressing them back into place while isolating the 2nd digit.   Therapeutic Exercise:  Pt. worked on the [employer] for 8 min. with constant monitoring of the BUEs. Pt. worked on changing, and alternating forward reverse position every 2 min. rest breaks were required. Pt. 1# dumbbell ex. for elbow flexion and extension, wrist flexion/extension, and radial deviation. Pt. requires rest breaks and verbal cues for proper technique. Pt. worked on digit extension with resistance at the table.  Manual Therapy:  Pt. Tolerated soft tissue/joint mobes to prepare the hand for functional use. Pt. Tolerated carpal rolls, and meats carpal spread stretches for edema control.  OT Education - 03/13/17 1457    Education provided  Yes    Education Details  FMC, UE ther. ex.    Person(s) Educated  Patient    Methods  Explanation    Comprehension  Verbalized understanding          OT Long Term Goals - 01/20/17 1741      OT LONG TERM GOAL #1   Title  Pt. will improve edema by 2 cm through MPs in preparation for ROM.    Baseline  wrist: 20 cm, MPs: 25.5    Time  12    Period  Weeks    Status  New    Target Date  04/14/17      OT LONG TERM GOAL #2   Title  Pt. will improve Left hand digit extension in preparation for weightbearing, and releasing objects during  ADLs/IADLs.    Baseline  Pt. is unable to achieve full digit extension on the left.    Time  12    Period  Weeks    Status  New    Target Date  04/14/17      OT LONG TERM GOAL #3   Title  Pt. left hand grip with  increase by 5# in preparation for holding a cup.    Baseline  Left 2#, RIght: 60    Time  12    Period  Weeks    Status  New    Target Date  04/14/17      OT LONG TERM GOAL #4   Title  Pt. will increased right shoulder strength by 1 mm grade to assist with ADLs.    Baseline  Impaired LUE strength    Time  12    Period  Weeks    Status  New    Target Date  04/14/17      OT LONG TERM GOAL #5   Title  Pt. will increase left wrist extension in preparation for reaching ADL items.    Baseline  wrist extension 28 degrees    Time  12    Period  Weeks    Status  New    Target Date  04/14/17      OT LONG TERM GOAL #6   Title  Pt. will improve left hand coordination skills to be able to button clothing.    Baseline  Pt. is unable    Time  12    Period  Weeks    Status  New    Target Date  04/14/17      OT LONG TERM GOAL #7   Title  Pt. will independently demonstrate visual compensatory strategies duirng ADLs, and IADLs.    Baseline  Glaucoma    Time  12    Period  Weeks    Status  New    Target Date  04/14/17            Plan - 03/13/17 1458    Clinical Impression Statement  Pt. presents with less edema throughout his LUE, and hand. Pt. presents with weakness, and impaired [facility] skills. Pt. worked on improving UE functioning for improved functional use during ADLs, and IADL. Pt. presents with no pain.     Occupational performance deficits (Please refer to evaluation for details):  ADL's;IADL's    Rehab Potential  Excellent    OT Frequency  2x / week    OT Duration  12 weeks    OT Treatment/Interventions  Self-care/ADL training;Therapeutic exercise;Patient/family education;Energy  conservation;Therapeutic activities;Manual Therapy;DME and/or AE  instruction;Therapeutic exercises;Moist Heat;Passive range of motion;Visual/perceptual remediation/compensation    Consulted and Agree with Plan of Care  Patient       Patient will benefit from skilled therapeutic intervention in order to improve the following deficits and impairments:  Abnormal gait, Pain, Decreased strength, Decreased range of motion, Impaired UE functional use, Decreased coordination, Decreased knowledge of use of DME, Decreased activity tolerance, Difficulty walking, Decreased balance, Other (comment)  Visit Diagnosis: Muscle weakness (generalized)    Problem List Patient Active Problem List   Diagnosis Date Noted  . Left hemiparesis (HCC)   . Essential hypertension 11/29/2016  . 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 Hepatitis C 07/22/2013    Olegario MessierElaine Trana Ressler, MS, OTR/L 03/13/2017, 3:09 PM  [place] [facility] [address] [place], [place], [address] Phone: [phone]   Fax:  [phone]  Name: Jason CroakJames Winchell MRN: [id number] Date of Birth: Aug 31, 1945

## 2017-03-18 ENCOUNTER — Ambulatory Visit: Payer: Medicare Other | Admitting: Physical Therapy

## 2017-03-18 ENCOUNTER — Encounter: Payer: Self-pay | Admitting: Speech Pathology

## 2017-03-18 ENCOUNTER — Encounter: Payer: Self-pay | Admitting: Physical Therapy

## 2017-03-18 ENCOUNTER — Ambulatory Visit: Payer: Medicare Other | Admitting: Occupational Therapy

## 2017-03-18 ENCOUNTER — Ambulatory Visit: Payer: Medicare Other | Admitting: Speech Pathology

## 2017-03-18 DIAGNOSIS — R278 Other lack of coordination: Secondary | ICD-10-CM

## 2017-03-18 DIAGNOSIS — I639 Cerebral infarction, unspecified: Secondary | ICD-10-CM

## 2017-03-18 DIAGNOSIS — M6281 Muscle weakness (generalized): Secondary | ICD-10-CM

## 2017-03-18 DIAGNOSIS — R2681 Unsteadiness on feet: Secondary | ICD-10-CM | POA: Diagnosis not present

## 2017-03-18 DIAGNOSIS — R471 Dysarthria and anarthria: Secondary | ICD-10-CM

## 2017-03-18 NOTE — Therapy (Signed)
[facility]IN [facility] [address] [place], [place], [address] Phone: [phone]   Fax:  [phone]  Physical Therapy Treatment  Patient Details  Name: Jason CroakJames Nolan MRN: [id number] Date of Birth: 1945-09-20 Referring Provider: Lorre MunroeBAITY, REGINA W   Encounter Date: 03/18/2017  PT End of Session - 03/18/17 1343    Visit Number  15    Number of Visits  25    Date for PT Re-Evaluation  04/21/17    Authorization Type  g code 5/10    PT Start Time  0140    PT Stop Time  0218    PT Time Calculation (min)  38 min    Equipment Utilized During Treatment  Gait belt    Activity Tolerance  Patient tolerated treatment well    Behavior During Therapy  [facility] for tasks assessed/performed       Past Medical History:  Diagnosis Date  . Glaucoma   . Hepatitis C   . Stroke [facility](HCC)     Past Surgical History:  Procedure Laterality Date  . IR ANGIO INTRA EXTRACRAN SEL COM CAROTID INNOMINATE UNI L MOD SED  11/25/2016  . IR ANGIO VERTEBRAL SEL SUBCLAVIAN INNOMINATE UNI R MOD SED  11/25/2016  . IR ANGIO VERTEBRAL SEL VERTEBRAL UNI L MOD SED  11/25/2016  . IR INTRAVSC STENT CERV CAROTID W/O EMB-PROT MOD SED INC ANGIO  11/25/2016  . IR PERCUTANEOUS ART THROMBECTOMY/INFUSION INTRACRANIAL INC DIAG ANGIO  11/25/2016  . IR RADIOLOGIST EVAL & MGMT  02/13/2017    There were no vitals filed for this visit.  Subjective Assessment - 03/18/17 1342    Subjective  Patinet is doing fine today. He has swelling in his left hand. He is not having any pain. He is doing his exercises at home. He is getting ready for a trip to [place] tomorrow.    Pertinent History  Pt. is a 71 y.o. 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 [facility] He underwent CTA brain showed short segment near occlusion of proximal R-ICA with associated intraluminal thrombus --likely due to dissection and emergent large vessel occlusion with right M2  occlusion and evolving right frontal lobe infarct.  He underwent cerebral angio with  complete revascularization of occluded superior division of R-MCA and near complete revascularization of proximal R-ICA with stent assisted angioplasty.  Stroke felt to be embolic in setting of ruptured plaque R-ICA and he was placed on ASA and brillinta due to stent. Pt. was transferred to inpatient rehab for several weeks, had home health therapy upon discharge.    Limitations  Walking;Lifting;House hold activities    How long can you sit comfortably?  unlimited     How long can you stand comfortably?  unlimited     How long can you walk comfortably?  unsure    Patient Stated Goals  "strengthen my whole L side"    Currently in Pain?  No/denies    Pain Score  0-No pain    Multiple Pain Sites  No       TREATMENT  Ther-ex Octane fitness L8-10x 5min warmup during history, fatigue monitored and resistance adjusted accordingly; Single leg heel raises with UE support on // bars x 10 bilateral; Green tband resisted side stepping in crouched position x multiple lengths in // bars; Walking tandem on 2 x 4 x 10  BOSU (round side up) squats 2 x 10 with intermittent finger touches, notable instability; Quantum  single leg press  120 x 15 x 2  on both sides; Side stepping on blue balance beam and tandem stepping blue balance beam Rocker board fwd/bwd and side to side x 20 mins each Standing on purple foam and tapping 6 inch stool x 20 x 2   Pt required cues verbal and demonstration for form and exercise technique. Intermittent finger touches on parallel bars with BOSU squats;                         PT Education - 03/18/17 1342    Education provided  Yes    Education Details  HEP    Person(s) Educated  Patient    Methods  Explanation;Tactile cues    Comprehension  Verbalized understanding;Returned demonstration       PT Short Term Goals - 02/26/17 1536      PT SHORT TERM GOAL #1    Title  Pt will improve 5x sit to stand to 18 seconds in order to demonstrate improved LE strength and improved dynamic balance.    Baseline  21 sec; 02/26/17: 16.1s    Time  6    Period  Weeks    Status  Achieved    Target Date  03/10/17      PT SHORT TERM GOAL #2   Title  Pt will improve gait speed to at least 1.0 m/s with proper heel strike and decreased hip hike in order to demonstrate imrpoved LE strength and balance.    Baseline  0.83 m/s; 02/26/17: 1.11 m/s    Time  6    Period  Weeks    Status  Achieved    Target Date  03/10/17      PT SHORT TERM GOAL #3   Title  Pt will improve Berg Balance score to 51/56 in order to demonstrate improved dynamic and static balance, decreasing overall falls risk.    Baseline  48/56; 02/26/17: 53/56    Time  6    Period  Weeks    Status  Achieved    Target Date  03/10/17        PT Long Term Goals - 02/26/17 1536      PT LONG TERM GOAL #1   Title  Pt will demonstrate independence with HEP in order to continue LE strengthening and manage symptoms.    Time  12    Period  Weeks    Status  On-going    Target Date  04/21/17      PT LONG TERM GOAL #2   Title  Pt will improve 5x sit to stand to <15 seconds in order to demonstrate improved LE strength and dynamic balance.    Baseline  21 sec; 02/26/17: 16.1s    Time  12    Period  Weeks    Status  On-going    Target Date  04/21/17      PT LONG TERM GOAL #3   Title  Pt will demonstrate gait speed of at least 1.2 m/s in order to demonstrate improved LE strength and improved balance in order to return to prior level of funciton.    Baseline  0.833 m/s 02/26/17: 1.11 m/s    Time  12    Period  Weeks    Status  On-going    Target Date  04/21/17      PT LONG TERM GOAL #4   Title  Pt will improve Berg Balance score to at least  54/56 in order demonstrate improved dynamic and static balance, decreasing overall falls risk.    Baseline  48/56; 02/26/17: 53/56    Time  12    Period  Weeks     Status  On-going    Target Date  04/21/17      PT LONG TERM GOAL #5   Title  Pt will demonstrate improved LE strength to 4+/5 in all limited planes in order to return to prior level of function.    Baseline  gross strength: 4/5; 02/26/17: deferred strength testing    Time  12    Period  Weeks    Status  On-going    Target Date  04/21/17      PT LONG TERM GOAL #6   Title  Pt will improve to >1500 feet without using AD in order to return to prior level of function, demonstrating improvements in LE strength, balance and endurance.    Baseline  1205 ft without AD; 02/26/17: deferred to next session    Time  12    Period  Weeks    Status  On-going    Target Date  04/21/17            Plan - 03/18/17 1344    Clinical Impression Statement  Patient continues to have weakness in LUE and LLE and decreased dynamic standing balance wiht higher level balance activities. He has fatigue with exercises and needs cues for technique and posture. He will continue to benefit from skilled PT to improve quality of  life , gait quality and  strength.     Rehab Potential  Good    Clinical Impairments Affecting Rehab Potential  high prior level of function    PT Frequency  2x / week    PT Duration  12 weeks    PT Treatment/Interventions  ADLs/Self Care Home Management;Cryotherapy;Moist Heat;Gait training;Stair training;Functional mobility training;Therapeutic activities;Therapeutic exercise;Balance training;Neuromuscular re-education;Patient/family education;Manual techniques    PT Next Visit Plan  , MMT, update those specific goals, continue strength/balance;    PT Home Exercise Plan  Add green theraband to HEP exercises    Consulted and Agree with Plan of Care  Patient       Patient will benefit from skilled therapeutic intervention in order to improve the following deficits and impairments:  Abnormal gait, Decreased balance, Decreased strength, Difficulty walking, Increased edema  Visit  Diagnosis: Muscle weakness (generalized)  Dysarthria and anarthria  Other lack of coordination  Unsteadiness on feet  Acute ischemic stroke (HCC) - 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 Left hemiparesis (HCC)   . Essential hypertension 11/29/2016  . 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 Hepatitis C 07/22/2013    Ezekiel Ina ,PT DPT 03/18/2017, 2:26 PM  [facility] MAIN [facility] SERVICES [address] [place], [place], [address] Phone: [phone]   Fax:  [phone]  Name: Jason Nolan MRN: [id number] Date of Birth: 22-Jun-1945

## 2017-03-18 NOTE — Therapy (Signed)
[place] MAIN [facility] SERVICES [address] [place], [place], [address] Phone: [phone]   Fax:  [phone]  Speech Language Pathology Treatment/Discharge Summary  Patient Details  Name: Jason Nolan MRN: [id number] Date of Birth: [birth year] Referring Provider: Jearld Fenton    Encounter Date: 03/18/2017  End of Session - 03/18/17 1704    Visit Number  6    Number of Visits  7    Date for SLP Re-Evaluation  03/25/17    SLP Start Time  1500    SLP Stop Time   1600    SLP Time Calculation (min)  60 min    Activity Tolerance  Patient tolerated treatment well       Past Medical History:  Diagnosis Date  . Glaucoma   . Hepatitis C   . Stroke [facility])     Past Surgical History:  Procedure Laterality Date  . IR ANGIO INTRA EXTRACRAN SEL COM CAROTID INNOMINATE UNI L MOD SED  11/25/2016  . IR ANGIO VERTEBRAL SEL SUBCLAVIAN INNOMINATE UNI R MOD SED  11/25/2016  . IR ANGIO VERTEBRAL SEL VERTEBRAL UNI L MOD SED  11/25/2016  . IR INTRAVSC STENT CERV CAROTID W/O EMB-PROT MOD SED INC ANGIO  11/25/2016  . IR PERCUTANEOUS ART THROMBECTOMY/INFUSION INTRACRANIAL INC DIAG ANGIO  11/25/2016  . IR RADIOLOGIST EVAL & MGMT  02/13/2017    There were no vitals filed for this visit.  Subjective Assessment - 03/18/17 1704    Subjective  Patient was in pleasant mood and willingly participated, joking "I feel like I am in kindergarten with some of these questions"    Currently in Pain?  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  The patient received inpatient rehab (including SLP) at [facility] 11/29/2016 - 12/21/2016.  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 He is able express himself and speech  is 90% intelligible with supervision needed to self-monitor and correct errors.       Treatment Provided   Treatment provided  Cognitive-Linquistic      Pain Assessment   Pain Assessment  No/denies pain      Cognitive-Linquistic Treatment   Treatment focused on  Dysarthria    Skilled Treatment  SPEECH: Intelligibility in reading-about 95%; intelligibility in conversation-95%. Patient reports that he doesn't like how he sounds, but understands that he is comparing to prior to stroke and that everyone can understand him, which is the goal.  Intelligibility during more demanding cognitive tasks-95%; Intelligible to unfamiliar listener as well      Assessment / Recommendations / Plan   Plan  All goals met      Progression Toward Goals   Progression toward goals  Goals met, education completed, patient discharged from SLP       SLP Education - 03/18/17 1704    Education provided  Yes    Education Details  use strategies for intelligibility     Person(s) Educated  Patient    Methods  Explanation    Comprehension  Verbalized understanding         SLP Long Term Goals - 03/18/17 1705      SLP LONG TERM GOAL #1   Title  Pt will improve oral motor strength and coordination for speech by completing speech based oral motor exercises independently.    Time  6    Period  Weeks    Status  Achieved      SLP LONG TERM GOAL #2   Title  Pt will improve speech intelligibility for multi-syllabic words by controlling rate of speech, over-articulation, and increased loudness to achieve 80% intelligibility with min. SLP cues.    Time  6    Period  Weeks    Status  Achieved      SLP LONG TERM GOAL #3   Title  Pt will improve speech intelligibility and prosody in response to moderately complex cognitive linguistic tasks by controlling rate of speech, over-articulation, and increased loudness to achieve 80% or better intelligibility.    Time  6    Period  Weeks    Status  Achieved       Plan  - 03/18/17 1704    Clinical Impression Statement  Patient has met all his goals and is ready for discharge from speech therapy. His overall intelligibility is 95% and he both verbalizes and uses strategies to maintain his intelligibility. Patient reports that he doesn't like how he sounds, but understands that he is comparing himself to prior to stroke and that everyone can understand him, which is the goal.        Speech Therapy Frequency  1x /week    Duration  Other (comment)    Treatment/Interventions  Oral motor exercises;SLP instruction and feedback;Patient/family education    Potential to Achieve Goals  Good    Potential Considerations  Ability to learn/carryover information;Co-morbidities;Cooperation/participation level;Medical prognosis;Pain level;Previous level of function;Severity of impairments;Family/community support    SLP Home Exercise Plan  use strategies as necessary    Consulted and Agree with Plan of Care  Patient       Patient will benefit from skilled therapeutic intervention in order to improve the following deficits and impairments:   Dysarthria and anarthria    Problem List Patient Active Problem List   Diagnosis Date Noted  . Left hemiparesis ([place])   . Essential hypertension 11/29/2016  . 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 Hepatitis C 07/22/2013    Cloris Flippo [place] 03/18/2017, 5:06 PM  [place] MAIN [facility] SERVICES [address] [place], [place], [address] Phone: [phone]   Fax:  [phone]   Name: Jason Nolan MRN: [id number] Date of Birth: 02-11-1946

## 2017-03-18 NOTE — Therapy (Signed)
[facility] MAIN [facility] [address] [place], [place], [address] Phone: [phone]   Fax:  [phone]  Occupational Therapy Treatment  Patient Details  Name: Jason CroakJames Nolan MRN: [id number] Date of Birth: [birth year] Referring Provider: Dr. Wynn BankerKirsteins   Encounter Date: 03/18/2017  OT End of Session - 03/18/17 1505    Visit Number  9    Number of Visits  24    Date for OT Re-Evaluation  04/14/17    Authorization Type  Medicare G-Code 9 of 10    OT Start Time  1422    OT Stop Time  1500    OT Time Calculation (min)  38 min    Activity Tolerance  Patient tolerated treatment well    Behavior During Therapy  [facility] for tasks assessed/performed       Past Medical History:  Diagnosis Date  . Glaucoma   . Hepatitis C   . [facility])     Past Surgical History:  Procedure Laterality Date  . IR ANGIO INTRA EXTRACRAN SEL COM CAROTID INNOMINATE UNI L MOD SED  11/25/2016  . IR ANGIO VERTEBRAL SEL SUBCLAVIAN INNOMINATE UNI R MOD SED  11/25/2016  . IR ANGIO VERTEBRAL SEL VERTEBRAL UNI L MOD SED  11/25/2016  . IR INTRAVSC STENT CERV CAROTID W/O EMB-PROT MOD SED INC ANGIO  11/25/2016  . IR PERCUTANEOUS ART THROMBECTOMY/INFUSION INTRACRANIAL INC DIAG ANGIO  11/25/2016  . IR RADIOLOGIST EVAL & MGMT  02/13/2017    There were no vitals filed for this visit.  Subjective Assessment - 03/18/17 1452    Subjective   Pt. reports he is going to tomorrow.    Patient is accompained by:  Family member    Pertinent History  Pt. is a 71 y.o. 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 [facility] He underwent CTA brain showed short segment near occlusion of proximal R-ICA with associated intraluminal thrombus --likely due to dissection and emergent large vessel occlusion with right M2 occlusion and evolving right frontal lobe infarct.  He underwent cerebral angio with  complete revascularization of occluded superior division of  R-MCA and near complete revascularization of proximal R-ICA with stent assisted angioplasty.  Stroke felt to be embolic in setting of ruptured plaque R-ICA and he was placed on ASA and brillinta due to stent. Pt. was transferred to inpatient rehab for several weeks, had home health therapy upon discharge, and now is ready for outpatient therapy services.    Currently in Pain?  No/denies    Multiple Pain Sites  No       OT TREATMENT    Neuro muscular re-education:  Pt. worked on grasping coins from a tabletop surface, placing them into a resistive container, and pushing them through the slot while isolating his 2nd digit.   Therapeutic Exercise:  Pt. worked on the [employer] for 8 min. With constant monitoring of the BUEs. Pt. worked on changing, and alternating forward reverse position every 2 min. rest breaks were required. Pt. worked on pinch strengthening in the left hand for lateral, and 3pt. pinch using yellow, red resistive clips. Pt. worked on placing the clips at various vertical and horizontal angles. Tactile and verbal cues were required for eliciting the desired movement.                           OT Education - 03/18/17 1453    Education provided  Yes  Education Details  LUE strength, and coordination    Person(s) Educated  Patient    Methods  Tactile cues;Explanation    Comprehension  Verbalized understanding          OT Long Term Goals - 01/20/17 1741      OT LONG TERM GOAL #1   Title  Pt. will improve edema by 2 cm through MPs in preparation for ROM.    Baseline  wrist: 20 cm, MPs: 25.5    Time  12    Period  Weeks    Status  New    Target Date  04/14/17      OT LONG TERM GOAL #2   Title  Pt. will improve Left hand digit extension in preparation for weightbearing, and releasing objects during ADLs/IADLs.    Baseline  Pt. is unable to achieve full digit extension on the left.    Time  12    Period  Weeks    Status  New    Target Date   04/14/17      OT LONG TERM GOAL #3   Title  Pt. left hand grip with  increase by 5# in preparation for holding a cup.    Baseline  Left 2#, RIght: 60    Time  12    Period  Weeks    Status  New    Target Date  04/14/17      OT LONG TERM GOAL #4   Title  Pt. will increased right shoulder strength by 1 mm grade to assist with ADLs.    Baseline  Impaired LUE strength    Time  12    Period  Weeks    Status  New    Target Date  04/14/17      OT LONG TERM GOAL #5   Title  Pt. will increase left wrist extension in preparation for reaching ADL items.    Baseline  wrist extension 28 degrees    Time  12    Period  Weeks    Status  New    Target Date  04/14/17      OT LONG TERM GOAL #6   Title  Pt. will improve left hand coordination skills to be able to button clothing.    Baseline  Pt. is unable    Time  12    Period  Weeks    Status  New    Target Date  04/14/17      OT LONG TERM GOAL #7   Title  Pt. will independently demonstrate visual compensatory strategies duirng ADLs, and IADLs.    Baseline  Glaucoma    Time  12    Period  Weeks    Status  New    Target Date  04/14/17            Plan - 03/18/17 1506    Clinical Impression Statement  Pt. continues to present with edema throughout the left hand. Pt,. continues to wear the edema glove. Pt. education was provided about ROM, hand exercises, and FMC tasks that he can perform while he is travelling in [place].     Occupational performance deficits (Please refer to evaluation for details):  ADL's;IADL's    Rehab Potential  Excellent    OT Frequency  2x / week    OT Duration  12 weeks    OT Treatment/Interventions  Self-care/ADL training;Therapeutic exercise;Patient/family education;Energy conservation;Therapeutic activities;Manual Therapy;DME and/or AE instruction;Therapeutic exercises;Moist Heat;Passive range of motion;Visual/perceptual remediation/compensation  Consulted and Agree with Plan of Care  Patient        Patient will benefit from skilled therapeutic intervention in order to improve the following deficits and impairments:  Abnormal gait, Pain, Decreased strength, Decreased range of motion, Impaired UE functional use, Decreased coordination, Decreased knowledge of use of DME, Decreased activity tolerance, Difficulty walking, Decreased balance, Other (comment)  Visit Diagnosis: Muscle weakness (generalized)  Other lack of coordination    Problem List Patient Active Problem List   Diagnosis Date Noted  . Left hemiparesis (HCC)   . Essential hypertension 11/29/2016  . 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 Hepatitis C 07/22/2013    Olegario Messier, MS, OTR/L 03/18/2017, 3:12 PM  [facility] MAIN [facility] SERVICES [address] [place], [place], [address] Phone: [phone]   Fax:  [phone]  Name: Kaedon Fanelli MRN: [id number] Date of Birth: 09/02/1945

## 2017-03-20 ENCOUNTER — Encounter: Payer: Medicare Other | Admitting: Occupational Therapy

## 2017-03-20 ENCOUNTER — Encounter: Payer: Medicare Other | Admitting: Speech Pathology

## 2017-03-20 ENCOUNTER — Ambulatory Visit: Payer: Medicare Other | Admitting: Physical Therapy

## 2017-03-25 ENCOUNTER — Ambulatory Visit: Payer: Medicare Other | Admitting: Physical Therapy

## 2017-03-25 ENCOUNTER — Encounter: Payer: Medicare Other | Admitting: Occupational Therapy

## 2017-03-25 ENCOUNTER — Encounter: Payer: Medicare Other | Admitting: Speech Pathology

## 2017-04-01 ENCOUNTER — Encounter: Payer: Medicare Other | Admitting: Speech Pathology

## 2017-04-01 ENCOUNTER — Encounter: Payer: Medicare Other | Admitting: Occupational Therapy

## 2017-04-01 ENCOUNTER — Ambulatory Visit: Payer: Medicare Other | Admitting: Physical Therapy

## 2017-04-03 ENCOUNTER — Ambulatory Visit: Payer: Medicare Other | Admitting: Occupational Therapy

## 2017-04-03 ENCOUNTER — Encounter: Payer: Self-pay | Admitting: Occupational Therapy

## 2017-04-03 ENCOUNTER — Encounter: Payer: Medicare Other | Admitting: Speech Pathology

## 2017-04-03 ENCOUNTER — Ambulatory Visit: Payer: Medicare Other | Admitting: Physical Therapy

## 2017-04-03 ENCOUNTER — Encounter: Payer: Self-pay | Admitting: Physical Therapy

## 2017-04-03 DIAGNOSIS — M6281 Muscle weakness (generalized): Secondary | ICD-10-CM

## 2017-04-03 DIAGNOSIS — R278 Other lack of coordination: Secondary | ICD-10-CM

## 2017-04-03 DIAGNOSIS — R471 Dysarthria and anarthria: Secondary | ICD-10-CM | POA: Diagnosis not present

## 2017-04-03 DIAGNOSIS — I639 Cerebral infarction, unspecified: Secondary | ICD-10-CM | POA: Diagnosis not present

## 2017-04-03 DIAGNOSIS — R2681 Unsteadiness on feet: Secondary | ICD-10-CM

## 2017-04-03 NOTE — Therapy (Signed)
[place] [facility] [address] [place], [place], [address] Phone: [phone]   Fax:  [phone]  Occupational Therapy Treatment  Patient Details  Name: Jason CroakJames Nolan MRN: [id number] Date of Birth: June 14, 1945 Referring Provider: Dr. Wynn BankerKirsteins   Encounter Date: 04/03/2017  OT End of Session - 04/03/17 1742    Visit Number  10    Number of Visits  24    Date for OT Re-Evaluation  04/14/17    Authorization Type  Medicare G-Code 10 of 10    OT Start Time  1350    OT Stop Time  1435    OT Time Calculation (min)  45 min    Activity Tolerance  Patient tolerated treatment well    Behavior During Therapy  [facility] IncWFL for tasks assessed/performed       Past Medical History:  Diagnosis Date  . Glaucoma   . Hepatitis C   . Stroke [facility](HCC)     Past Surgical History:  Procedure Laterality Date  . IR ANGIO INTRA EXTRACRAN SEL COM CAROTID INNOMINATE UNI L MOD SED  11/25/2016  . IR ANGIO VERTEBRAL SEL SUBCLAVIAN INNOMINATE UNI R MOD SED  11/25/2016  . IR ANGIO VERTEBRAL SEL VERTEBRAL UNI L MOD SED  11/25/2016  . IR INTRAVSC STENT CERV CAROTID W/O EMB-PROT MOD SED INC ANGIO  11/25/2016  . IR PERCUTANEOUS ART THROMBECTOMY/INFUSION INTRACRANIAL INC DIAG ANGIO  11/25/2016  . IR RADIOLOGIST EVAL & MGMT  02/13/2017  . RADIOLOGY WITH ANESTHESIA N/A 11/25/2016   Procedure: RADIOLOGY WITH ANESTHESIA;  Surgeon: Radiologist, Medication, MD;  Location: [facility] OR;  Service: Radiology;  Laterality: N/A;    There were no vitals filed for this visit.  Subjective Assessment - 04/03/17 1741    Subjective   Pt. reports having had a nice vacation in [place] for [place].    Patient is accompained by:  Family member    Pertinent History  Pt. is a 71 y.o. 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 [facility] He underwent CTA brain showed short segment near occlusion of proximal R-ICA with associated intraluminal thrombus --likely due  to dissection and emergent large vessel occlusion with right M2 occlusion and evolving right frontal lobe infarct.  He underwent cerebral angio with  complete revascularization of occluded superior division of R-MCA and near complete revascularization of proximal R-ICA with stent assisted angioplasty.  Stroke felt to be embolic in setting of ruptured plaque R-ICA and he was placed on ASA and brillinta due to stent. Pt. was transferred to inpatient rehab for several weeks, had home health therapy upon discharge, and now is ready for outpatient therapy services.    Limitations  Dominant LUE functioning, left hand edema, distal ROM, coordination, and overal strength.    Currently in Pain?  No/denies       OT TREATMENT    Neuro muscular re-education:  Pt. worked on grasping 1" resistive cubes, and maintaining grasp when pulling them off the board. Pt. Attempted isolating the 2nd digit when pressing them into place.   Therapeutic Exercise:  Pt. Worked on the [employer] for 8 min. With constant monitoring of the BUEs. Pt. Worked on changing, and alternating forward reverse position every 2 min. Rest breaks were required. Pt. performed gross gripping with grip strengthener. Pt. worked on sustaining grip while grasping pegs and reaching at various heights. Gripper was placed in the 2nd resistive slot with the white resistive spring. Pt. Worked on pinch strengthening  in the left hand for lateral, and 3pt. pinch using yellow, red, green, and resistive clips. Pt. worked on placing the clips at various vertical and horizontal angles. Tactile and verbal cues were required for eliciting the desired movement. Pt. Worked on AROM for digit extension, and wrist extension.                        OT Education - 04/03/17 1742    Education provided  Yes    Education Details  [facility], ther. ex.    Person(s) Educated  Patient    Methods  Explanation    Comprehension  Verbalized understanding           OT Long Term Goals - 04/03/17 1756      OT LONG TERM GOAL #1   Title  Pt. will improve edema by 2 cm through MPs in preparation for ROM.    Baseline  wrist: 20 cm, MPs: 25.5    Time  12    Period  Weeks    Status  On-going    Target Date  04/14/17      OT LONG TERM GOAL #2   Title  Pt. will improve Left hand digit extension in preparation for weightbearing, and releasing objects during ADLs/IADLs.    Baseline  Pt. is unable to achieve full digit extension on the left.    Time  12    Period  Weeks    Status  New    Target Date  04/14/17      OT LONG TERM GOAL #3   Title  Pt. left hand grip with  increase by 5# in preparation for holding a cup.    Baseline  Left 2#, RIght: 60    Time  12    Period  Weeks    Status  On-going    Target Date  04/14/17      OT LONG TERM GOAL #4   Title  Pt. will increased right shoulder strength by 1 mm grade to assist with ADLs.    Baseline  Impaired LUE strength    Time  12    Period  Weeks    Status  On-going    Target Date  04/14/17      OT LONG TERM GOAL #5   Title  Pt. will increase left wrist extension in preparation for reaching ADL items.    Baseline  wrist extension 28 degrees    Time  12    Period  Weeks    Status  On-going    Target Date  04/14/17      OT LONG TERM GOAL #6   Title  Pt. will improve left hand coordination skills to be able to button clothing.    Baseline  Pt. is unable    Time  12    Period  Weeks    Status  On-going    Target Date  04/14/17      OT LONG TERM GOAL #7   Title  Pt. will independently demonstrate visual compensatory strategies duirng ADLs, and IADLs.    Baseline  Glaucoma    Time  12    Period  Weeks    Status  On-going    Target Date  04/14/17            Plan - 04/03/17 1743    Clinical Impression Statement  Pt. edema is improving in his left hand, however pt. continues to have edema. Discussed  edema control techniques, and strategies for home. Pt. is improving with  hand strength, and is starting to initiate basic grasping. Pt. continues to require work on improving LUE strength, grip strength, pinch strength, and coordination skills for improved use during ADLs, and IADLs.   Occupational performance deficits (Please refer to evaluation for details):  ADL's;IADL's    Rehab Potential  Excellent    OT Frequency  2x / week    OT Duration  12 weeks    OT Treatment/Interventions  Self-care/ADL training;Therapeutic exercise;Patient/family education;Energy conservation;Therapeutic activities;Manual Therapy;DME and/or AE instruction;Therapeutic exercises;Moist Heat;Passive range of motion;Visual/perceptual remediation/compensation    Consulted and Agree with Plan of Care  Patient       Patient will benefit from skilled therapeutic intervention in order to improve the following deficits and impairments:  Abnormal gait, Pain, Decreased strength, Decreased range of motion, Impaired UE functional use, Decreased coordination, Decreased knowledge of use of DME, Decreased activity tolerance, Difficulty walking, Decreased balance, Other (comment)  Visit Diagnosis: Muscle weakness (generalized)  Other lack of coordination  G-Codes - 04/03/17 1755    Functional Assessment Tool Used (Outpatient only)  clinical judgement based on pt. current functional level    Functional Limitation  Carrying, moving and handling objects    Carrying, Moving and Handling Objects Current Status ([id number])  At least 60 percent but less than 80 percent impaired, limited or restricted    Carrying, Moving and Handling Objects Goal Status (O1308(G8985)  At least 20 percent but less than 40 percent impaired, limited or restricted       Problem List Patient Active Problem List   Diagnosis Date Noted  . Left hemiparesis (HCC)   . Essential hypertension 11/29/2016  . 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 Hepatitis C 07/22/2013    Olegario MessierElaine Khyli Swaim, MS, OTR/L 04/03/2017, 5:58 PM  [place] [facility] [address] [place], [place], [address] Phone: [phone]   Fax:  [phone]  Name: Jason CroakJames Nolan MRN: [id number] Date of Birth: 05/09/1945

## 2017-04-03 NOTE — Therapy (Signed)
[place] [facility] [address] [place], [place], [address] Phone: [phone]   Fax:  [phone]  Physical Therapy Treatment  Patient Details  Name: Jason CroakJames Nolan MRN: [id number] Date of Birth: [birth year] Referring Provider: Lorre MunroeBAITY, REGINA W   Encounter Date: 04/03/2017  PT End of Session - 04/03/17 1308    Visit Number  16    Number of Visits  25    Date for PT Re-Evaluation  04/21/17    Authorization Type  g code 6/10    PT Start Time  0105    PT Stop Time  0145    PT Time Calculation (min)  40 min    Equipment Utilized During Treatment  Gait belt    Activity Tolerance  Patient tolerated treatment well    Behavior During Therapy  [facility] CenterWFL for tasks assessed/performed       Past Medical History:  Diagnosis Date  . Glaucoma   . Hepatitis C   . Stroke [facility](HCC)     Past Surgical History:  Procedure Laterality Date  . IR ANGIO INTRA EXTRACRAN SEL COM CAROTID INNOMINATE UNI L MOD SED  11/25/2016  . IR ANGIO VERTEBRAL SEL SUBCLAVIAN INNOMINATE UNI R MOD SED  11/25/2016  . IR ANGIO VERTEBRAL SEL VERTEBRAL UNI L MOD SED  11/25/2016  . IR INTRAVSC STENT CERV CAROTID W/O EMB-PROT MOD SED INC ANGIO  11/25/2016  . IR PERCUTANEOUS ART THROMBECTOMY/INFUSION INTRACRANIAL INC DIAG ANGIO  11/25/2016  . IR RADIOLOGIST EVAL & MGMT  02/13/2017  . RADIOLOGY WITH ANESTHESIA N/A 11/25/2016   Procedure: RADIOLOGY WITH ANESTHESIA;  Surgeon: Radiologist, Medication, MD;  Location: [facility] OR;  Service: Radiology;  Laterality: N/A;    There were no vitals filed for this visit.  Subjective Assessment - 04/03/17 1308    Subjective  Patinet is doing fine today. He has a nice trip to [place] for thanks[place].     Pertinent History  Pt. is a 71 y.o. 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 [facility] He underwent CTA brain showed short segment near occlusion of proximal R-ICA with associated intraluminal  thrombus --likely due to dissection and emergent large vessel occlusion with right M2 occlusion and evolving right frontal lobe infarct.  He underwent cerebral angio with  complete revascularization of occluded superior division of R-MCA and near complete revascularization of proximal R-ICA with stent assisted angioplasty.  Stroke felt to be embolic in setting of ruptured plaque R-ICA and he was placed on ASA and brillinta due to stent. Pt. was transferred to inpatient rehab for several weeks, had home health therapy upon discharge.    Limitations  Walking;Lifting;House hold activities    How long can you sit comfortably?  unlimited     How long can you stand comfortably?  unlimited     How long can you walk comfortably?  unsure    Patient Stated Goals  "strengthen my whole L side"    Currently in Pain?  No/denies    Pain Score  0-No pain       Ther-ex Octane [id number]  Tandem standing on 1/2 foam  with UE support on // bars x 2 mins Green tband resisted side stepping in crouched position x multiple lengths in // bars; Walking tandem on 2 x 4 x 10  BOSU (round side up) squats 2 x 10 with intermittent finger touches, notable instability; Quantum single leg press  120 x 15 x 2  on both sides; BLE heel raises 75 lbs x 20 x 2 Side stepping on blue balance beam and tandem stepping blue balance beam Rocker board fwd/bwd and side to side x 20 mins each Standing on purple foam and tapping 6 inch stool x 20 x 2   Pt required cues verbal and demonstration for form and exercise technique. Intermittent finger touches on parallel bars withBOSU squats;                       PT Education - 04/03/17 1308    Education provided  Yes    Education Details  HEP    Person(s) Educated  Patient    Methods  Explanation    Comprehension  Verbalized understanding       PT Short Term Goals - 04/03/17 1344      PT SHORT TERM GOAL #1   Title  Pt will improve 5x sit to  stand to 18 seconds in order to demonstrate improved LE strength and improved dynamic balance.    Baseline  21 sec; 02/26/17: 16.1s    Time  6    Period  Weeks    Status  Achieved      PT SHORT TERM GOAL #2   Title  Pt will improve gait speed to at least 1.0 m/s with proper heel strike and decreased hip hike in order to demonstrate imrpoved LE strength and balance.    Baseline  0.83 m/s; 02/26/17: 1.11 m/s    Time  6    Period  Weeks    Status  Achieved      PT SHORT TERM GOAL #3   Title  Pt will improve Berg Balance score to 51/56 in order to demonstrate improved dynamic and static balance, decreasing overall falls risk.    Baseline  48/56; 02/26/17: 53/56    Time  6    Period  Weeks    Status  Achieved        PT Long Term Goals - 04/03/17 1343      PT LONG TERM GOAL #1   Title  Pt will demonstrate independence with HEP in order to continue LE strengthening and manage symptoms.    Time  12    Period  Weeks    Status  On-going    Target Date  04/21/17      PT LONG TERM GOAL #2   Title  Pt will improve 5x sit to stand to <15 seconds in order to demonstrate improved LE strength and dynamic balance.    Baseline  21 sec; 02/26/17: 16.1s    Time  12    Period  Weeks    Status  On-going    Target Date  04/21/17      PT LONG TERM GOAL #3   Title  Pt will demonstrate gait speed of at least 1.2 m/s in order to demonstrate improved LE strength and improved balance in order to return to prior level of funciton.    Baseline  0.833 m/s 02/26/17: 1.11 m/s    Time  12    Period  Weeks    Status  On-going    Target Date  04/21/17      PT LONG TERM GOAL #4   Title  Pt will improve Berg Balance score to at least 54/56 in order demonstrate improved dynamic and static balance, decreasing overall falls risk.    Baseline  48/56; 02/26/17: 53/56    Time  12  Period  Weeks    Status  On-going    Target Date  04/21/17      PT LONG TERM GOAL #5   Title  Pt will demonstrate improved  LE strength to 4+/5 in all limited planes in order to return to prior level of function.    Baseline  gross strength: 4/5; 02/26/17: deferred strength testing    Time  12    Period  Weeks    Status  On-going    Target Date  04/21/17      PT LONG TERM GOAL #6   Title  Pt will improve to >1500 feet without using AD in order to return to prior level of function, demonstrating improvements in LE strength, balance and endurance.    Baseline  1205 ft without AD; 02/26/17: deferred to next session    Time  12    Period  Weeks    Status  On-going            Plan - 04/03/17 1311    Clinical Impression Statement  Patient instructed in advanced LE strengthening exercise. Advanced HEP with seated and standing exercise. Patient requires min VCs for correct positioning and to slow down LE movement for better strengthening; He reports less fatigue today as compared to last session; He demonstrates improved balance when wearing tennis shoes as compared to dress shoes. He would benefit from additional skilled PT intervention to improve balance/gait safety and reduce fall risk;    Rehab Potential  Good    Clinical Impairments Affecting Rehab Potential  high prior level of function    PT Frequency  2x / week    PT Duration  12 weeks    PT Treatment/Interventions  ADLs/Self Care Home Management;Cryotherapy;Moist Heat;Gait training;Stair training;Functional mobility training;Therapeutic activities;Therapeutic exercise;Balance training;Neuromuscular re-education;Patient/family education;Manual techniques    PT Next Visit Plan  , MMT, update those specific goals, continue strength/balance;    PT Home Exercise Plan  Add green theraband to HEP exercises    Consulted and Agree with Plan of Care  Patient       Patient will benefit from skilled therapeutic intervention in order to improve the following deficits and impairments:  Abnormal gait, Decreased balance, Decreased strength, Difficulty walking,  Increased edema  Visit Diagnosis: Dysarthria and anarthria  Muscle weakness (generalized)  Other lack of coordination  Unsteadiness on feet  Acute ischemic stroke (HCC) - 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 Left hemiparesis (HCC)   . Essential hypertension 11/29/2016  . 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 Hepatitis C 07/22/2013    Ezekiel Ina, [place] DPT 04/03/2017, 1:44 PM  [facility] MAIN [facility] SERVICES [address] [place], [place], [address] Phone: [phone]   Fax:  [phone]  Name: Lain Tetterton MRN: [id number] Date of Birth: 01/11/1946

## 2017-04-07 ENCOUNTER — Ambulatory Visit: Payer: Medicare Other | Attending: Internal Medicine | Admitting: Occupational Therapy

## 2017-04-07 ENCOUNTER — Encounter: Payer: Self-pay | Admitting: Occupational Therapy

## 2017-04-07 DIAGNOSIS — R278 Other lack of coordination: Secondary | ICD-10-CM | POA: Diagnosis not present

## 2017-04-07 DIAGNOSIS — M6281 Muscle weakness (generalized): Secondary | ICD-10-CM | POA: Insufficient documentation

## 2017-04-07 DIAGNOSIS — R471 Dysarthria and anarthria: Secondary | ICD-10-CM | POA: Diagnosis not present

## 2017-04-07 DIAGNOSIS — I639 Cerebral infarction, unspecified: Secondary | ICD-10-CM | POA: Insufficient documentation

## 2017-04-07 DIAGNOSIS — R2681 Unsteadiness on feet: Secondary | ICD-10-CM | POA: Insufficient documentation

## 2017-04-07 NOTE — Therapy (Signed)
[place] [facility] [address] , [place], [address] Phone: [phone]   Fax:  [phone]  Occupational Therapy Treatment  Patient Details  Name: Jason CroakJames Nolan MRN: [id number] Date of Birth: 1945-07-13 Referring Provider: Dr. Wynn BankerKirsteins   Encounter Date: 04/07/2017  OT End of Session - 04/07/17 1705    Visit Number  11    Number of Visits  24    Authorization Type  Medicare G-Code 1     OT Start Time  1530    OT Stop Time  1615    OT Time Calculation (min)  45 min    Activity Tolerance  Patient tolerated treatment well    Behavior During Therapy  [facility] for tasks assessed/performed       Past Medical History:  Diagnosis Date  . Glaucoma   . Hepatitis C   . Stroke [facility](HCC)     Past Surgical History:  Procedure Laterality Date  . IR ANGIO INTRA EXTRACRAN SEL COM CAROTID INNOMINATE UNI L MOD SED  11/25/2016  . IR ANGIO VERTEBRAL SEL SUBCLAVIAN INNOMINATE UNI R MOD SED  11/25/2016  . IR ANGIO VERTEBRAL SEL VERTEBRAL UNI L MOD SED  11/25/2016  . IR INTRAVSC STENT CERV CAROTID W/O EMB-PROT MOD SED INC ANGIO  11/25/2016  . IR PERCUTANEOUS ART THROMBECTOMY/INFUSION INTRACRANIAL INC DIAG ANGIO  11/25/2016  . IR RADIOLOGIST EVAL & MGMT  02/13/2017  . RADIOLOGY WITH ANESTHESIA N/A 11/25/2016   Procedure: RADIOLOGY WITH ANESTHESIA;  Surgeon: Radiologist, Medication, MD;  Location: [facility] OR;  Service: Radiology;  Laterality: N/A;    There were no vitals filed for this visit.  Subjective Assessment - 04/07/17 1554    Subjective   Pt. reports he is having Left shoulder pain when reaching under his right axilla.    Pertinent History  Pt. is a 71 y.o. 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 [facility] He underwent CTA brain showed short segment near occlusion of proximal R-ICA with associated intraluminal thrombus --likely due to dissection and emergent large vessel occlusion with right M2 occlusion and  evolving right frontal lobe infarct.  He underwent cerebral angio with  complete revascularization of occluded superior division of R-MCA and near complete revascularization of proximal R-ICA with stent assisted angioplasty.  Stroke felt to be embolic in setting of ruptured plaque R-ICA and he was placed on ASA and brillinta due to stent. Pt. was transferred to inpatient rehab for several weeks, had home health therapy upon discharge, and now is ready for outpatient therapy services.    Currently in Pain?  No/denies    Pain Score  2       OT TREATMENT    Neuro muscular re-education:  Pt. Worked on grasping 1" circular objects, storing them in the palm of his hand, and releasing objects.  Therapeutic Exercise:  Pt. Worked on the [employer] for 8 min. with constant monitoring of the BUEs. Pt. worked on changing, and alternating forward reverse position every 2 min. rest breaks were required. Pt. worked on there. Ex. with the shoulder for flexion, abduction, horizontal abduction elbow extension. 2# dumbbell ex. For left elbow flexion and extension, forearm supination/pronation, wrist flexion/extension, and radial deviation. Pt. requires rest breaks and verbal cues for proper technique. Pt. Worked on active digit extension, and abduction/adduction.                          OT  Education - 04/07/17 1556    Education provided  Yes    Education Details  [facility], ther. ex    Person(s) Educated  Patient    Methods  Explanation    Comprehension  Verbalized understanding          OT Long Term Goals - 04/03/17 1756      OT LONG TERM GOAL #1   Title  Pt. will improve edema by 2 cm through MPs in preparation for ROM.    Baseline  wrist: 20 cm, MPs: 25.5    Time  12    Period  Weeks    Status  On-going    Target Date  04/14/17      OT LONG TERM GOAL #2   Title  Pt. will improve Left hand digit extension in preparation for weightbearing, and releasing objects during ADLs/IADLs.     Baseline  Pt. is unable to achieve full digit extension on the left.    Time  12    Period  Weeks    Status  New    Target Date  04/14/17      OT LONG TERM GOAL #3   Title  Pt. left hand grip with  increase by 5# in preparation for holding a cup.    Baseline  Left 2#, RIght: 60    Time  12    Period  Weeks    Status  On-going    Target Date  04/14/17      OT LONG TERM GOAL #4   Title  Pt. will increased right shoulder strength by 1 mm grade to assist with ADLs.    Baseline  Impaired LUE strength    Time  12    Period  Weeks    Status  On-going    Target Date  04/14/17      OT LONG TERM GOAL #5   Title  Pt. will increase left wrist extension in preparation for reaching ADL items.    Baseline  wrist extension 28 degrees    Time  12    Period  Weeks    Status  On-going    Target Date  04/14/17      OT LONG TERM GOAL #6   Title  Pt. will improve left hand coordination skills to be able to button clothing.    Baseline  Pt. is unable    Time  12    Period  Weeks    Status  On-going    Target Date  04/14/17      OT LONG TERM GOAL #7   Title  Pt. will independently demonstrate visual compensatory strategies duirng ADLs, and IADLs.    Baseline  Glaucoma    Time  12    Period  Weeks    Status  On-going    Target Date  04/14/17            Plan - 04/07/17 1706    Clinical Impression Statement  Pt. reports he has minimal pain at home when reaching for his right shoulder with his Left UE. Pt. continues to improve with Left hand edema, however continues to have edema in his fingers. Pt. continues to work on improving UE strength, and developing [facility] skills for improved use during ADLs, and IADL.    Occupational performance deficits (Please refer to evaluation for details):  ADL's;IADL's    Rehab Potential  Excellent    OT Frequency  2x / week    OT Duration  12 weeks    OT Treatment/Interventions  Self-care/ADL training;Therapeutic exercise;Neuromuscular  education;Patient/family education;Energy conservation;Therapeutic activities;DME and/or AE instruction    Clinical Decision Making  Multiple treatment options, significant modification of task necessary    Consulted and Agree with Plan of Care  Patient       Patient will benefit from skilled therapeutic intervention in order to improve the following deficits and impairments:  Abnormal gait, Pain, Decreased strength, Decreased range of motion, Impaired UE functional use, Decreased coordination, Decreased knowledge of use of DME, Decreased activity tolerance, Difficulty walking, Decreased balance, Other (comment)  Visit Diagnosis: Muscle weakness (generalized)  Other lack of coordination    Problem List Patient Active Problem List   Diagnosis Date Noted  . Left hemiparesis (HCC)   . Essential hypertension 11/29/2016  . 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 Hepatitis C 07/22/2013    Olegario MessierElaine Plumer Mittelstaedt, MS, OTR/L 04/07/2017, 5:12 PM  [place] [facility] [address] [place], [place], [address] Phone: [phone]   Fax:  [phone]  Name: Jason CroakJames Nolan MRN: [id number] Date of Birth: [birth year]

## 2017-04-08 ENCOUNTER — Encounter: Payer: Medicare Other | Attending: Physical Medicine & Rehabilitation

## 2017-04-08 ENCOUNTER — Ambulatory Visit (HOSPITAL_BASED_OUTPATIENT_CLINIC_OR_DEPARTMENT_OTHER): Payer: Medicare Other | Admitting: Physical Medicine & Rehabilitation

## 2017-04-08 ENCOUNTER — Other Ambulatory Visit: Payer: Self-pay

## 2017-04-08 ENCOUNTER — Encounter: Payer: Self-pay | Admitting: Physical Medicine & Rehabilitation

## 2017-04-08 VITALS — BP 123/75 | HR 70

## 2017-04-08 DIAGNOSIS — G8194 Hemiplegia, unspecified affecting left nondominant side: Secondary | ICD-10-CM | POA: Diagnosis not present

## 2017-04-08 DIAGNOSIS — R600 Localized edema: Secondary | ICD-10-CM | POA: Diagnosis not present

## 2017-04-08 DIAGNOSIS — H409 Unspecified glaucoma: Secondary | ICD-10-CM | POA: Diagnosis not present

## 2017-04-08 DIAGNOSIS — I639 Cerebral infarction, unspecified: Secondary | ICD-10-CM

## 2017-04-08 DIAGNOSIS — I69352 Hemiplegia and hemiparesis following cerebral infarction affecting left dominant side: Secondary | ICD-10-CM | POA: Insufficient documentation

## 2017-04-08 DIAGNOSIS — I69398 Other sequelae of cerebral infarction: Secondary | ICD-10-CM | POA: Diagnosis not present

## 2017-04-08 DIAGNOSIS — R209 Unspecified disturbances of skin sensation: Secondary | ICD-10-CM | POA: Diagnosis not present

## 2017-04-08 DIAGNOSIS — Z87891 Personal history of nicotine dependence: Secondary | ICD-10-CM | POA: Insufficient documentation

## 2017-04-08 NOTE — Progress Notes (Signed)
Subjective:    Patient ID: Jason Nolan, male    DOB: Nov 11, 1945, 71 y.o.   MRN: [id number] 71 y.o.left handed male with history of Hep C-treated with Harvoni, glaucoma who [facility] with left sided weakness, facial droop and slurred speech due to early changes of R-MCA territory infarct. He underwent CTA brain showed short segment near occlusion of proximal R-ICA with associated intraluminal thrombus --likely due to dissection and emergent large vessel occlusion with right M2 occlusion and evolving right frontal lobe infarct. He underwent cerebral angio with complete revascularization of occluded superior division of R-MCA and near complete revascularization of proximal R-ICA with stent assisted angioplasty. Stroke felt to be embolic in setting of ruptured plaque R-ICA and he was placedon ASA and brillinta due to stent. Patient with resultant left sided weakness, expressive deficits with aphonia and dysphagia HPI  Able to tie his shoes sing Left hand No falls Does not use assistive device BERG improved to 53/56 Left hand edema improving, was using Large left Isotoner glove, now using a Medium  Patient complains of muscle fatigue, he is asking about coenzyme Q 10.  He is on Lipitor.  We discussed dosages and the fact that it is not universally accepted as a effective treatment for this problem. Pain Inventory Average Pain 0 Pain Right Now 0 My pain is no pain  In the last 24 hours, has pain interfered with the following? General activity 0 Relation with others 0 Enjoyment of life 0 What TIME of day is your pain at its worst? no pain Sleep (in general) Good  Pain is worse with: no pain Pain improves with: no pain Relief from Meds: 0  Mobility walk without assistance use a cane how many minutes can you walk? 30 ability to climb steps?  yes do you drive?  yes Do you have any goals in this area?  no  Function retired Do you have any goals in this area?   no  Neuro/Psych No problems in this area  Prior Studies Any changes since last visit?  no  Physicians involved in your care no   Family History  Problem Relation Age of Onset  . Hypertension Mother   . Hypertension Father    Social History   Socioeconomic History  . Marital status: Married    Spouse name: Not on file  . Number of children: Not on file  . Years of education: Not on file  . Highest education level: Not on file  Social Needs  . Financial resource strain: Not on file  . Food insecurity - worry: Not on file  . Food insecurity - inability: Not on file  . Transportation needs - medical: Not on file  . Transportation needs - non-medical: Not on file  Occupational History  . Not on file  Tobacco Use  . Smoking status: Former Games developermoker  . Smokeless tobacco: Never Used  Substance and Sexual Activity  . Alcohol use: No    Alcohol/week: 0.0 oz  . Drug use: No  . Sexual activity: Yes    Partners: Female    Birth control/protection: None  Other Topics Concern  . Not on file  Social History Narrative  . Not on file   Past Surgical History:  Procedure Laterality Date  . IR ANGIO INTRA EXTRACRAN SEL COM CAROTID INNOMINATE UNI L MOD SED  11/25/2016  . IR ANGIO VERTEBRAL SEL SUBCLAVIAN INNOMINATE UNI R MOD SED  11/25/2016  . IR ANGIO VERTEBRAL SEL  VERTEBRAL UNI L MOD SED  11/25/2016  . IR INTRAVSC STENT CERV CAROTID W/O EMB-PROT MOD SED INC ANGIO  11/25/2016  . IR PERCUTANEOUS ART THROMBECTOMY/INFUSION INTRACRANIAL INC DIAG ANGIO  11/25/2016  . IR RADIOLOGIST EVAL & MGMT  02/13/2017  . RADIOLOGY WITH ANESTHESIA N/A 11/25/2016   Procedure: RADIOLOGY WITH ANESTHESIA;  Surgeon: Radiologist, Medication, MD;  Location: [facility] OR;  Service: Radiology;  Laterality: N/A;   Past Medical History:  Diagnosis Date  . Glaucoma   . Hepatitis C   . Stroke [facility](HCC)    There were no vitals taken for this visit.  Opioid Risk Score:   Fall Risk Score:  `1  Depression screen PHQ  2/9  Depression screen [facility] 2/9 04/08/2017 12/31/2016 01/26/2016 10/27/2014 07/22/2013  Decreased Interest 0 0 0 0 0  Down, Depressed, Hopeless 0 0 0 0 0  PHQ - 2 Score 0 0 0 0 0  Altered sleeping - 0 - - -  Tired, decreased energy - 0 - - -  Change in appetite - 0 - - -  Feeling bad or failure about yourself  - 0 - - -  Trouble concentrating - 0 - - -  Moving slowly or fidgety/restless - 0 - - -  Suicidal thoughts - 0 - - -  PHQ-9 Score - 0 - - -  Difficult doing work/chores - Not difficult at all - - -     Review of Systems  Constitutional: Negative.   HENT: Negative.   Eyes: Negative.   Respiratory: Negative.   Cardiovascular: Negative.   Gastrointestinal: Negative.   Endocrine: Negative.   Genitourinary: Negative.   Musculoskeletal: Negative.   Skin: Negative.   Allergic/Immunologic: Negative.   Neurological: Negative.   Hematological: Negative.   Psychiatric/Behavioral: Negative.        Objective:   Physical Exam  Constitutional: He is oriented to person, place, and time. He appears well-developed and well-nourished.  Neurological: He is alert and oriented to person, place, and time.  Psychiatric: He has a normal mood and affect. His behavior is normal. Judgment and thought content normal.  Nursing note and vitals reviewed.  Motor strength is 5/5 on the right side 4/5 in the left deltoid bicep tricep finger flexors and extensors left grip is limited by range of motion he has approximately 50% range at the MCP PIP and DIPs on the left side. Left lower extremity has 4+ hip flexor knee extensor ankle dorsiflexor Romberg is negative Able to tandem gait although this is difficult for him and he has to do this slowly.  He is able to Toe Walk and Heel Walk. Speech remains with mild to moderate dysarthria      Assessment & Plan:  1.  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 Making good recovery main functional issue is left hand range of  motion and strength. Continue outpatient OT and PT 2.  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  Fatigue this may be stroke related versus statin related.  He may empirically try coenzyme Q 10 although if this is a CVA related issue will likely not help.  Recommended that he make try it for a month and if no improvement discontinue.  Dose recommended 100-200 mg/day

## 2017-04-08 NOTE — Patient Instructions (Signed)
Coenzyme Q 100-200mg  per day

## 2017-04-09 ENCOUNTER — Encounter: Payer: Self-pay | Admitting: Occupational Therapy

## 2017-04-09 ENCOUNTER — Encounter: Payer: Self-pay | Admitting: Physical Therapy

## 2017-04-09 ENCOUNTER — Ambulatory Visit: Payer: Medicare Other | Admitting: Physical Therapy

## 2017-04-09 ENCOUNTER — Ambulatory Visit: Payer: Medicare Other | Admitting: Occupational Therapy

## 2017-04-09 DIAGNOSIS — I639 Cerebral infarction, unspecified: Secondary | ICD-10-CM | POA: Diagnosis not present

## 2017-04-09 DIAGNOSIS — R278 Other lack of coordination: Secondary | ICD-10-CM

## 2017-04-09 DIAGNOSIS — M6281 Muscle weakness (generalized): Secondary | ICD-10-CM

## 2017-04-09 DIAGNOSIS — R471 Dysarthria and anarthria: Secondary | ICD-10-CM | POA: Diagnosis not present

## 2017-04-09 DIAGNOSIS — R2681 Unsteadiness on feet: Secondary | ICD-10-CM

## 2017-04-09 NOTE — Therapy (Signed)
[place] [facility] [address] [place], [place], [address] Phone: [phone]   Fax:  [phone]  Physical Therapy Treatment  Patient Details  Name: Jason CroakJames Nolan MRN: [id number] Date of Birth: 07-18-1945 Referring Provider: Lorre MunroeBAITY, REGINA W   Encounter Date: 04/09/2017  PT End of Session - 04/09/17 1452    Visit Number  17    Number of Visits  25    Date for PT Re-Evaluation  04/21/17    Authorization Type  g code 7/10    PT Start Time  0245    PT Stop Time  0330    PT Time Calculation (min)  45 min    Equipment Utilized During Treatment  Gait belt    Activity Tolerance  Patient tolerated treatment well    Behavior During Therapy  [facility] for tasks assessed/performed       Past Medical History:  Diagnosis Date  . Glaucoma   . Hepatitis C   . Stroke [facility](HCC)     Past Surgical History:  Procedure Laterality Date  . IR ANGIO INTRA EXTRACRAN SEL COM CAROTID INNOMINATE UNI L MOD SED  11/25/2016  . IR ANGIO VERTEBRAL SEL SUBCLAVIAN INNOMINATE UNI R MOD SED  11/25/2016  . IR ANGIO VERTEBRAL SEL VERTEBRAL UNI L MOD SED  11/25/2016  . IR INTRAVSC STENT CERV CAROTID W/O EMB-PROT MOD SED INC ANGIO  11/25/2016  . IR PERCUTANEOUS ART THROMBECTOMY/INFUSION INTRACRANIAL INC DIAG ANGIO  11/25/2016  . IR RADIOLOGIST EVAL & MGMT  02/13/2017  . RADIOLOGY WITH ANESTHESIA N/A 11/25/2016   Procedure: RADIOLOGY WITH ANESTHESIA;  Surgeon: Radiologist, Medication, MD;  Location: [facility] OR;  Service: Radiology;  Laterality: N/A;    There were no vitals filed for this visit.  Subjective Assessment - 04/09/17 1450    Subjective  Patinet is doing fine today.No new concerns.    Pertinent History  Pt. is a 71 y.o. 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 [facility] He underwent CTA brain showed short segment near occlusion of proximal R-ICA with associated intraluminal thrombus --likely due to dissection and  emergent large vessel occlusion with right M2 occlusion and evolving right frontal lobe infarct.  He underwent cerebral angio with  complete revascularization of occluded superior division of R-MCA and near complete revascularization of proximal R-ICA with stent assisted angioplasty.  Stroke felt to be embolic in setting of ruptured plaque R-ICA and he was placed on ASA and brillinta due to stent. Pt. was transferred to inpatient rehab for several weeks, had home health therapy upon discharge.    Limitations  Walking;Lifting;House hold activities    How long can you sit comfortably?  unlimited     How long can you stand comfortably?  unlimited     How long can you walk comfortably?  unsure    Patient Stated Goals  "strengthen my whole L side"    Currently in Pain?  No/denies    Pain Score  0-No pain    Multiple Pain Sites  No       Neuromuscular training: Tandem stand with head turns x 2 minutes Side stepping with head control Stepping onto AIREX, then on to 4 inch step followed by stepping down onto AIREX and then level surface in //bars x15.  Pt required occasional UE assist, and performance improved with each repetition   Stepping over and back x10 bilaterally   Stepping over and back x`10 bilaterally with verbal  cueing to perform exercise as fast as possible Side step and back x10 bilaterally Side step and back x10 bilaterally with verbal cueing to perform exercise as fast as possible Side stepping on blue foam beam x 5 Matrix fwd/bwd/side to side 17. 5 lbs x 5 each way BAPS board fwd/bwd, side to side, CCW, CW x 20 1/2 tandem x 2 mins  There ex: Leg press with 90#x 10, followed by 100# x10 and then 120# x10                         PT Education - 04/09/17 1450    Education provided  Yes    Education Details  exercise techniques    Person(s) Educated  Patient    Methods  Explanation    Comprehension  Verbalized understanding       PT Short Term Goals -  04/03/17 1344      PT SHORT TERM GOAL #1   Title  Pt will improve 5x sit to stand to 18 seconds in order to demonstrate improved LE strength and improved dynamic balance.    Baseline  21 sec; 02/26/17: 16.1s    Time  6    Period  Weeks    Status  Achieved      PT SHORT TERM GOAL #2   Title  Pt will improve gait speed to at least 1.0 m/s with proper heel strike and decreased hip hike in order to demonstrate imrpoved LE strength and balance.    Baseline  0.83 m/s; 02/26/17: 1.11 m/s    Time  6    Period  Weeks    Status  Achieved      PT SHORT TERM GOAL #3   Title  Pt will improve Berg Balance score to 51/56 in order to demonstrate improved dynamic and static balance, decreasing overall falls risk.    Baseline  48/56; 02/26/17: 53/56    Time  6    Period  Weeks    Status  Achieved        PT Long Term Goals - 04/03/17 1343      PT LONG TERM GOAL #1   Title  Pt will demonstrate independence with HEP in order to continue LE strengthening and manage symptoms.    Time  12    Period  Weeks    Status  On-going    Target Date  04/21/17      PT LONG TERM GOAL #2   Title  Pt will improve 5x sit to stand to <15 seconds in order to demonstrate improved LE strength and dynamic balance.    Baseline  21 sec; 02/26/17: 16.1s    Time  12    Period  Weeks    Status  On-going    Target Date  04/21/17      PT LONG TERM GOAL #3   Title  Pt will demonstrate gait speed of at least 1.2 m/s in order to demonstrate improved LE strength and improved balance in order to return to prior level of funciton.    Baseline  0.833 m/s 02/26/17: 1.11 m/s    Time  12    Period  Weeks    Status  On-going    Target Date  04/21/17      PT LONG TERM GOAL #4   Title  Pt will improve Berg Balance score to at least 54/56 in order demonstrate improved dynamic and static balance, decreasing overall falls risk.  Baseline  48/56; 02/26/17: 53/56    Time  12    Period  Weeks    Status  On-going    Target Date   04/21/17      PT LONG TERM GOAL #5   Title  Pt will demonstrate improved LE strength to 4+/5 in all limited planes in order to return to prior level of function.    Baseline  gross strength: 4/5; 02/26/17: deferred strength testing    Time  12    Period  Weeks    Status  On-going    Target Date  04/21/17      PT LONG TERM GOAL #6   Title  Pt will improve to >1500 feet without using AD in order to return to prior level of function, demonstrating improvements in LE strength, balance and endurance.    Baseline  1205 ft without AD; 02/26/17: deferred to next session    Time  12    Period  Weeks    Status  On-going            Plan - 04/09/17 1453    Clinical Impression Statement  Patient demonstrates good postural control in tandem and narrow stance on purple foam with EO and EC. Patient demonstrated hesitation with full weight bearing during lunge but minimal cueing resulted in good technique and no LOB. Patient improved ability to ascend/descend stairs with supervision and without UE assist today. Patient shows good BLE strength and hip control during  leg press. Patient will continue to benefit from skilled physical therapy to improve endurance and dynamic balance to reduce fall risk.    Rehab Potential  Good    Clinical Impairments Affecting Rehab Potential  high prior level of function    PT Frequency  2x / week    PT Duration  12 weeks    PT Treatment/Interventions  ADLs/Self Care Home Management;Cryotherapy;Moist Heat;Gait training;Stair training;Functional mobility training;Therapeutic activities;Therapeutic exercise;Balance training;Neuromuscular re-education;Patient/family education;Manual techniques    PT Next Visit Plan  , MMT, update those specific goals, continue strength/balance;    PT Home Exercise Plan  Add green theraband to HEP exercises    Consulted and Agree with Plan of Care  Patient       Patient will benefit from skilled therapeutic intervention in  order to improve the following deficits and impairments:  Abnormal gait, Decreased balance, Decreased strength, Difficulty walking, Increased edema  Visit Diagnosis: Muscle weakness (generalized)  Other lack of coordination  Dysarthria and anarthria  Unsteadiness on feet  Acute ischemic stroke (HCC) - 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 Left hemiparesis (HCC)   . Essential hypertension 11/29/2016  . 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 Hepatitis C 07/22/2013    Ezekiel Ina, PT DPT 04/09/2017, 2:55 PM  [place] [facility] MAIN [facility] SERVICES [address] [place], [place], [address] Phone: [phone]   Fax:  [phone]  Name: Jason Nolan MRN: [id number] Date of Birth: May 24, 1945

## 2017-04-09 NOTE — Therapy (Signed)
[facility] [address] [place], [place], [address] Phone: [phone]   Fax:  [phone]  Occupational Therapy Treatment  Patient Details  Name: Jason Nolan MRN: [id number] Date of Birth: 1945-08-17 Referring Provider: Dr. Wynn BankerKirsteins   Encounter Date: 04/09/2017  OT End of Session - 04/09/17 1402    Visit Number  12    Number of Visits  24    Date for OT Re-Evaluation  04/14/17    Authorization Type  Medicare G-Code 2     OT Start Time  1348    OT Stop Time  1430    OT Time Calculation (min)  42 min    Activity Tolerance  Patient tolerated treatment well    Behavior During Therapy  [facility] for tasks assessed/performed       Past Medical History:  Diagnosis Date  . Glaucoma   . Hepatitis C   . Stroke [facility](HCC)     Past Surgical History:  Procedure Laterality Date  . IR ANGIO INTRA EXTRACRAN SEL COM CAROTID INNOMINATE UNI L MOD SED  11/25/2016  . IR ANGIO VERTEBRAL SEL SUBCLAVIAN INNOMINATE UNI R MOD SED  11/25/2016  . IR ANGIO VERTEBRAL SEL VERTEBRAL UNI L MOD SED  11/25/2016  . IR INTRAVSC STENT CERV CAROTID W/O EMB-PROT MOD SED INC ANGIO  11/25/2016  . IR PERCUTANEOUS ART THROMBECTOMY/INFUSION INTRACRANIAL INC DIAG ANGIO  11/25/2016  . IR RADIOLOGIST EVAL & MGMT  02/13/2017  . RADIOLOGY WITH ANESTHESIA N/A 11/25/2016   Procedure: RADIOLOGY WITH ANESTHESIA;  Surgeon: Radiologist, Medication, MD;  Location: [facility] OR;  Service: Radiology;  Laterality: N/A;    There were no vitals filed for this visit.  Subjective Assessment - 04/09/17 1401    Subjective   Pt. reports his right arm is tired today.    Patient is accompained by:  Family member    Pertinent History  Pt. is a 71 y.o. 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 [facility] He underwent CTA brain showed short segment near occlusion of proximal R-ICA with associated intraluminal thrombus --likely due to dissection and emergent large  vessel occlusion with right M2 occlusion and evolving right frontal lobe infarct.  He underwent cerebral angio with  complete revascularization of occluded superior division of R-MCA and near complete revascularization of proximal R-ICA with stent assisted angioplasty.  Stroke felt to be embolic in setting of ruptured plaque R-ICA and he was placed on ASA and brillinta due to stent. Pt. was transferred to inpatient rehab for several weeks, had home health therapy upon discharge, and now is ready for outpatient therapy services.    Limitations  Dominant LUE functioning, left hand edema, distal ROM, coordination, and overal strength.    Currently in Pain?  No/denies       OT TREATMENT    Neuro muscular re-education:  Pt. worked on grasping 1" resistive cubes with his 2nd digit, and thumb. Pt. worked on initiating moving the cubes through his hand. Pt. worked on pressing the cubes back in place while isolating his second digit. Pt. Has difficulty using his hand for [facility] tasks.  Therapeutic Exercise:  2# dumbbell ex. for elbow flexion and extension, forearm supination/pronation, 1# wrist flexion/extension, and radial deviation. Pt. requires rest breaks and verbal cues for proper technique.                       OT Education - 04/09/17 1402  Education provided  Yes    Education Details  UE strength, and FMC    Person(s) Educated  Patient    Methods  Explanation    Comprehension  Verbalized understanding          OT Long Term Goals - 04/03/17 1756      OT LONG TERM GOAL #1   Title  Pt. will improve edema by 2 cm through MPs in preparation for ROM.    Baseline  wrist: 20 cm, MPs: 25.5    Time  12    Period  Weeks    Status  On-going    Target Date  04/14/17      OT LONG TERM GOAL #2   Title  Pt. will improve Left hand digit extension in preparation for weightbearing, and releasing objects during ADLs/IADLs.    Baseline  Pt. is unable to achieve full digit  extension on the left.    Time  12    Period  Weeks    Status  New    Target Date  04/14/17      OT LONG TERM GOAL #3   Title  Pt. left hand grip with  increase by 5# in preparation for holding a cup.    Baseline  Left 2#, RIght: 60    Time  12    Period  Weeks    Status  On-going    Target Date  04/14/17      OT LONG TERM GOAL #4   Title  Pt. will increased right shoulder strength by 1 mm grade to assist with ADLs.    Baseline  Impaired LUE strength    Time  12    Period  Weeks    Status  On-going    Target Date  04/14/17      OT LONG TERM GOAL #5   Title  Pt. will increase left wrist extension in preparation for reaching ADL items.    Baseline  wrist extension 28 degrees    Time  12    Period  Weeks    Status  On-going    Target Date  04/14/17      OT LONG TERM GOAL #6   Title  Pt. will improve left hand coordination skills to be able to button clothing.    Baseline  Pt. is unable    Time  12    Period  Weeks    Status  On-going    Target Date  04/14/17      OT LONG TERM GOAL #7   Title  Pt. will independently demonstrate visual compensatory strategies duirng ADLs, and IADLs.    Baseline  Glaucoma    Time  12    Period  Weeks    Status  On-going    Target Date  04/14/17            Plan - 04/09/17 1403    Clinical Impression Statement  Pt. reports he is engaging his Left hand for closing the car door, put clothes on the hanger, and hiking pants. Pt. continues to present with weakness, impaired coordination, and limited hand function skills which continues to limit his ability to complete ADLs, and IADL functioning. Pt. continues to work on improving UE stregth, coordination, and hand function for improved ADL, and IADL functioning       Occupational performance deficits (Please refer to evaluation for details):  ADL's;IADL's    Rehab Potential  Excellent    OT Frequency  2x / week    OT Duration  12 weeks    OT Treatment/Interventions  Self-care/ADL  training;Therapeutic exercise;Neuromuscular education;Patient/family education;Energy conservation;Therapeutic activities;DME and/or AE instruction    Clinical Decision Making  Multiple treatment options, significant modification of task necessary    Consulted and Agree with Plan of Care  Patient       Patient will benefit from skilled therapeutic intervention in order to improve the following deficits and impairments:  Abnormal gait, Pain, Decreased strength, Decreased range of motion, Impaired UE functional use, Decreased coordination, Decreased knowledge of use of DME, Decreased activity tolerance, Difficulty walking, Decreased balance, Other (comment)  Visit Diagnosis: Muscle weakness (generalized)  Other lack of coordination    Problem List Patient Active Problem List   Diagnosis Date Noted  . Left hemiparesis (HCC)   . Essential hypertension 11/29/2016  . 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 Hepatitis C 07/22/2013    Olegario Messier, MS, OTR/L 04/09/2017, 2:15 PM  [facility] MAIN [facility] SERVICES [address] [place], [place], [address] Phone: [phone]   Fax:  [phone]  Name: Jason Nolan MRN: [id number] Date of Birth: June 10, 1945

## 2017-04-14 ENCOUNTER — Ambulatory Visit: Payer: Medicare Other

## 2017-04-14 ENCOUNTER — Encounter: Payer: Medicare Other | Admitting: Occupational Therapy

## 2017-04-16 ENCOUNTER — Ambulatory Visit: Payer: Medicare Other | Admitting: Occupational Therapy

## 2017-04-16 ENCOUNTER — Encounter: Payer: Self-pay | Admitting: Occupational Therapy

## 2017-04-16 ENCOUNTER — Encounter: Payer: Self-pay | Admitting: Physical Therapy

## 2017-04-16 ENCOUNTER — Ambulatory Visit: Payer: Medicare Other | Admitting: Physical Therapy

## 2017-04-16 DIAGNOSIS — R471 Dysarthria and anarthria: Secondary | ICD-10-CM | POA: Diagnosis not present

## 2017-04-16 DIAGNOSIS — R278 Other lack of coordination: Secondary | ICD-10-CM

## 2017-04-16 DIAGNOSIS — R2681 Unsteadiness on feet: Secondary | ICD-10-CM

## 2017-04-16 DIAGNOSIS — M6281 Muscle weakness (generalized): Secondary | ICD-10-CM

## 2017-04-16 DIAGNOSIS — I639 Cerebral infarction, unspecified: Secondary | ICD-10-CM | POA: Diagnosis not present

## 2017-04-16 NOTE — Therapy (Signed)
[facility] [facility] [address] [place], [place], [address] Phone: [phone]   Fax:  [phone]  Occupational Therapy Treatment/Recertification Note  Patient Details  Name: Jason Nolan MRN: [id number] Date of Birth: 1945-08-02 Referring Provider (Historical): Dr. Wynn BankerKirsteins   Encounter Date: 04/16/2017  OT End of Session - 04/16/17 1613    Visit Number  13    Number of Visits  24    Date for OT Re-Evaluation  04/14/17    Authorization Type  Medicare G-Code 3    OT Start Time  1622    OT Stop Time  1706    OT Time Calculation (min)  44 min    Activity Tolerance  Patient tolerated treatment well    Behavior During Therapy  [facility] for tasks assessed/performed       Past Medical History:  Diagnosis Date  . Glaucoma   . Hepatitis C   . Stroke [facility](HCC)     Past Surgical History:  Procedure Laterality Date  . IR ANGIO INTRA EXTRACRAN SEL COM CAROTID INNOMINATE UNI L MOD SED  11/25/2016  . IR ANGIO VERTEBRAL SEL SUBCLAVIAN INNOMINATE UNI R MOD SED  11/25/2016  . IR ANGIO VERTEBRAL SEL VERTEBRAL UNI L MOD SED  11/25/2016  . IR INTRAVSC STENT CERV CAROTID W/O EMB-PROT MOD SED INC ANGIO  11/25/2016  . IR PERCUTANEOUS ART THROMBECTOMY/INFUSION INTRACRANIAL INC DIAG ANGIO  11/25/2016  . IR RADIOLOGIST EVAL & MGMT  02/13/2017  . RADIOLOGY WITH ANESTHESIA N/A 11/25/2016   Procedure: RADIOLOGY WITH ANESTHESIA;  Surgeon: Radiologist, Medication, MD;  Location: [facility] OR;  Service: Radiology;  Laterality: N/A;    There were no vitals filed for this visit.  Subjective Assessment - 04/16/17 1617    Subjective   Pt. reports that his hand edema is improving.    Patient is accompained by:  Family member    Pertinent History  Pt. is a 71 y.o. 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 [facility] He underwent CTA brain showed short segment near occlusion of proximal R-ICA with associated intraluminal thrombus  --likely due to dissection and emergent large vessel occlusion with right M2 occlusion and evolving right frontal lobe infarct.  He underwent cerebral angio with  complete revascularization of occluded superior division of R-MCA and near complete revascularization of proximal R-ICA with stent assisted angioplasty.  Stroke felt to be embolic in setting of ruptured plaque R-ICA and he was placed on ASA and brillinta due to stent. Pt. was transferred to inpatient rehab for several weeks, had home health therapy upon discharge, and now is ready for outpatient therapy services.    Currently in Pain?  No/denies      OT TREATMENT    Neuro muscular re-education:  Pt. worked on grasping 1" resistive cubes alternating thumb opposition to the tip of the 2nd digit while the board is placed at a vertical angle. Pt. worked on pressing the cubes back into place while alternating isolated 2nd digit extension. Pt. Required cues for alternating weightbearing through his right hand.  Therapeutic Exercise:  Pt. Worked on the [employer] for 8 min. with constant monitoring of the BUEs. Pt. performed gross gripping with grip strengthener. Pt. worked on sustaining grip while grasping pegs and reaching at various heights. Gripper was placed in the resistive slot with the white resistive spring.Pt. Worked on changing, and alternating forward reverse position every 2 min. Rest breaks were required. Pt. Worked on pinch strengthening  in the left hand for lateral, and 3pt. pinch using yellow, and red resistive clips. Pt. worked on placing the clips at various vertical and horizontal angles. Tactile and verbal cues were required for eliciting the desired movement.                          OT Education - 04/16/17 1618    Education provided  Yes    Education Details  HEP    Person(s) Educated  Patient    Methods  Explanation    Comprehension  Verbalized understanding          OT Long Term Goals -  04/16/17 1633      OT LONG TERM GOAL #1   Title  Pt. will improve edema by 2 cm through MPs in preparation for ROM.    Baseline  improving    Time  12    Period  Weeks    Status  On-going    Target Date  07/09/17      OT LONG TERM GOAL #2   Title  Pt. will improve Left hand digit extension in preparation for weightbearing, and releasing objects during ADLs/IADLs.    Baseline  Pt. is unable to achieve full digit extension on the left.    Time  12    Period  Weeks    Status  On-going    Target Date  07/09/17      OT LONG TERM GOAL #3   Title  Pt. left hand grip with  increase by 5# in preparation for holding a cup.    Baseline  Left 2#, RIght: 60    Time  12    Period  Weeks    Status  On-going    Target Date  07/09/17      OT LONG TERM GOAL #4   Title  Pt. will increased right shoulder strength by 1 mm grade to assist with ADLs.    Baseline  Impaired LUE strength    Time  12    Period  Weeks    Target Date  07/09/17      OT LONG TERM GOAL #5   Title  Pt. will increase left wrist extension in preparation for reaching ADL items.    Baseline  wrist extension 28 degrees    Time  12    Period  Weeks    Status  On-going    Target Date  07/09/17      OT LONG TERM GOAL #6   Title  Pt. will improve left hand coordination skills to be able to button clothing.    Baseline  Pt. conitnues to have difficulty    Time  12    Period  Weeks    Status  On-going    Target Date  07/09/17      OT LONG TERM GOAL #7   Title  Pt. will independently demonstrate visual compensatory strategies duirng ADLs, and IADLs.    Baseline  Glaucoma    Time  12    Period  Weeks    Status  On-going    Target Date  07/09/17            Plan - 04/16/17 1619    Clinical Impression Statement  Pt. reports he is using his left UE more during household tasks at home including; vacuuming, folding laudry, ADLs, and IADLs. Pt. continues to have difficulty using his left hand for [facility] skills, writing,  grasping, gripping, pinching objects needed for use during ADLS, and IADLs.  Goals were reviewed.    Occupational performance deficits (Please refer to evaluation for details):  ADL's;IADL's    Rehab Potential  Excellent    OT Frequency  2x / week    OT Duration  12 weeks    OT Treatment/Interventions  Self-care/ADL training;Therapeutic exercise;Neuromuscular education;Patient/family education;Energy conservation;Therapeutic activities;DME and/or AE instruction    Clinical Decision Making  Multiple treatment options, significant modification of task necessary    Consulted and Agree with Plan of Care  Patient       Patient will benefit from skilled therapeutic intervention in order to improve the following deficits and impairments:  Abnormal gait, Pain, Decreased strength, Decreased range of motion, Impaired UE functional use, Decreased coordination, Decreased knowledge of use of DME, Decreased activity tolerance, Difficulty walking, Decreased balance, Other (comment)  Visit Diagnosis: Muscle weakness (generalized)  Other lack of coordination  G-Codes - 04/16/17 1637    Functional Assessment Tool Used (Outpatient only)  clinical judgement based on pt. current functional level    Functional Limitation  Carrying, moving and handling objects    Carrying, Moving and Handling Objects Current Status ([id number])  At least 40 percent but less than 60 percent impaired, limited or restricted    Carrying, Moving and Handling Objects Goal Status ([id number])  At least 1 percent but less than 20 percent impaired, limited or restricted       Problem List Patient Active Problem List   Diagnosis Date Noted  . Left hemiparesis (HCC)   . Essential hypertension 11/29/2016  . 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 Hepatitis C 07/22/2013    Olegario MessierElaine Deamber Buckhalter, MS, OTR/L 04/16/2017, 4:38 PM  [facility] MAIN [facility] [address] [place], [place], [address] Phone: [phone]   Fax:  [phone]  Name: Jason CroakJames Nolan MRN: [id number] Date of Birth: 1945/06/30

## 2017-04-16 NOTE — Therapy (Signed)
[facility] MAIN [facility] SERVICES [address] [place], [place], [address] Phone: [phone]   Fax:  [phone]  Physical Therapy Treatment  Patient Details  Name: Jason Nolan MRN: [id number] Date of Birth: [birth year] Referring Provider: [name]   Encounter Date: 04/16/2017  PT End of Session - 04/16/17 1457    Visit Number  18    Number of Visits  25    Date for PT Re-Evaluation  04/21/17    Authorization Type  g code 8/10    PT Start Time  0200    PT Stop Time  0245    PT Time Calculation (min)  45 min    Equipment Utilized During Treatment  Gait belt    Activity Tolerance  Patient tolerated treatment well    Behavior During Therapy  [facility] for tasks assessed/performed       Past Medical History:  Diagnosis Date  . Glaucoma   . Hepatitis C   . Stroke [facility])     Past Surgical History:  Procedure Laterality Date  . IR ANGIO INTRA EXTRACRAN SEL COM CAROTID INNOMINATE UNI L MOD SED  11/25/2016  . IR ANGIO VERTEBRAL SEL SUBCLAVIAN INNOMINATE UNI R MOD SED  11/25/2016  . IR ANGIO VERTEBRAL SEL VERTEBRAL UNI L MOD SED  11/25/2016  . IR INTRAVSC STENT CERV CAROTID W/O EMB-PROT MOD SED INC ANGIO  11/25/2016  . IR PERCUTANEOUS ART THROMBECTOMY/INFUSION INTRACRANIAL INC DIAG ANGIO  11/25/2016  . IR RADIOLOGIST EVAL & MGMT  02/13/2017  . RADIOLOGY WITH ANESTHESIA N/A 11/25/2016   Procedure: RADIOLOGY WITH ANESTHESIA;  Surgeon: Radiologist, Medication, MD;  Location: [facility] OR;  Service: Radiology;  Laterality: N/A;    There were no vitals filed for this visit.  Subjective Assessment - 04/16/17 1455    Subjective  Patinet is doing fine today.No new concerns.    Pertinent History  Pt. is a 71 y.o. 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 Pt. was transferred to [facility]. He underwent CTA brain showed short segment near occlusion of proximal R-ICA with associated intraluminal thrombus --likely due to dissection  and emergent large vessel occlusion with right M2 occlusion and evolving right frontal lobe infarct.  He underwent cerebral angio with  complete revascularization of occluded superior division of R-MCA and near complete revascularization of proximal R-ICA with stent assisted angioplasty.  Stroke felt to be embolic in setting of ruptured plaque R-ICA and he was placed on ASA and brillinta due to stent. Pt. was transferred to inpatient rehab for several weeks, had home health therapy upon discharge.    Limitations  Walking;Lifting;House hold activities    How long can you sit comfortably?  unlimited     How long can you stand comfortably?  unlimited     How long can you walk comfortably?  unsure    Patient Stated Goals  "strengthen my whole L side"    Currently in Pain?  No/denies    Pain Score  0-No pain    Multiple Pain Sites  No       Therapeutic exercise; TM walking left and right at . 4 m/sec and elevation 2 quariped with left knee flex and hip extension with pelvis tilted Standing hip extension, and knee flex left with ankle up and hip extended x 10 (patient has difficulty with keeping trunk extended, knee flexed and hip extended.) Bridging with left leg extended and right knee flexed x 10 and then change sides  x 10  Lunges to BOSU ball x 10 left and right x 10 Eccentric step downs to floor x 10 BLE CGA and Min  verbal cues used throughout with increased in postural sway and LOB most seen with narrow base of support and while on uneven surfaces. Continues to have balance deficits typical with diagnosis. Patient performs intermediate level exercises without pain behaviors and needs verbal cuing for postural alignment and head positioning                     PT Education - 04/16/17 1456    Education provided  Yes    Education Details  HEP     Person(s) Educated  Patient    Methods  Explanation    Comprehension  Verbalized understanding       PT Short Term Goals -  04/03/17 1344      PT SHORT TERM GOAL #1   Title  Pt will improve 5x sit to stand to 18 seconds in order to demonstrate improved LE strength and improved dynamic balance.    Baseline  21 sec; 02/26/17: 16.1s    Time  6    Period  Weeks    Status  Achieved      PT SHORT TERM GOAL #2   Title  Pt will improve gait speed to at least 1.0 m/s with proper heel strike and decreased hip hike in order to demonstrate imrpoved LE strength and balance.    Baseline  0.83 m/s; 02/26/17: 1.11 m/s    Time  6    Period  Weeks    Status  Achieved      PT SHORT TERM GOAL #3   Title  Pt will improve Berg Balance score to 51/56 in order to demonstrate improved dynamic and static balance, decreasing overall falls risk.    Baseline  48/56; 02/26/17: 53/56    Time  6    Period  Weeks    Status  Achieved        PT Long Term Goals - 04/03/17 1343      PT LONG TERM GOAL #1   Title  Pt will demonstrate independence with HEP in order to continue LE strengthening and manage symptoms.    Time  12    Period  Weeks    Status  On-going    Target Date  04/21/17      PT LONG TERM GOAL #2   Title  Pt will improve 5x sit to stand to <15 seconds in order to demonstrate improved LE strength and dynamic balance.    Baseline  21 sec; 02/26/17: 16.1s    Time  12    Period  Weeks    Status  On-going    Target Date  04/21/17      PT LONG TERM GOAL #3   Title  Pt will demonstrate gait speed of at least 1.2 m/s in order to demonstrate improved LE strength and improved balance in order to return to prior level of funciton.    Baseline  0.833 m/s 02/26/17: 1.11 m/s    Time  12    Period  Weeks    Status  On-going    Target Date  04/21/17      PT LONG TERM GOAL #4   Title  Pt will improve Berg Balance score to at least 54/56 in order demonstrate improved dynamic and static balance, decreasing overall falls risk.    Baseline  48/56; 02/26/17: 53/56  Time  12    Period  Weeks    Status  On-going    Target Date   04/21/17      PT LONG TERM GOAL #5   Title  Pt will demonstrate improved LE strength to 4+/5 in all limited planes in order to return to prior level of function.    Baseline  gross strength: 4/5; 02/26/17: deferred strength testing    Time  12    Period  Weeks    Status  On-going    Target Date  04/21/17      PT LONG TERM GOAL #6   Title  Pt will improve 6MWT to >1500 feet without using AD in order to return to prior level of function, demonstrating improvements in LE strength, balance and endurance.    Baseline  1205 ft without AD; 02/26/17: deferred to next session    Time  12    Period  Weeks    Status  On-going            Plan - 04/16/17 1503    Clinical Impression Statement  Patient demonstrates good postural control in tandem and narrow stance on purple foam with EO and EC. Patient demonstrated hesitation with full weight bearing during lunge but minimal cueing resulted in good technique and no LOB. Patient improved ability to ascend/descend stairs with supervision and without UE assist today. Patient shows good BLE strength and hip control during DL leg press. Patient will continue to benefit from skilled physical therapy to improve endurance and dynamic balance to reduce fall risk.    Rehab Potential  Good    Clinical Impairments Affecting Rehab Potential  high prior level of function    PT Frequency  2x / week    PT Duration  12 weeks    PT Treatment/Interventions  ADLs/Self Care Home Management;Cryotherapy;Moist Heat;Gait training;Stair training;Functional mobility training;Therapeutic activities;Therapeutic exercise;Balance training;Neuromuscular re-education;Patient/family education;Manual techniques    PT Next Visit Plan  6MWT, MMT, update those specific goals, continue strength/balance;    PT Home Exercise Plan  Add green theraband to HEP exercises    Consulted and Agree with Plan of Care  Patient       Patient will benefit from skilled therapeutic intervention in  order to improve the following deficits and impairments:  Abnormal gait, Decreased balance, Decreased strength, Difficulty walking, Increased edema  Visit Diagnosis: Muscle weakness (generalized)  Other lack of coordination  Unsteadiness on feet     Problem List Patient Active Problem List   Diagnosis Date Noted  . Left hemiparesis (HCC)   . Essential hypertension 11/29/2016  . 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 Hepatitis C 07/22/2013    Ezekiel InaMansfield, Jamond Neels S, [place] DPT 04/16/2017, 3:04 PM  [facility] [address] [place], [place], [address] Phone: [phone]   Fax:  [phone]  Name: Jason Nolan MRN: [id number] Date of Birth: 10-17-1945

## 2017-04-16 NOTE — Addendum Note (Signed)
Addended by: Avon GullyJAGENTENFL, Trygve Thal M on: 04/16/2017 04:49 PM   Modules accepted: Orders

## 2017-04-18 ENCOUNTER — Other Ambulatory Visit (HOSPITAL_COMMUNITY): Payer: Self-pay | Admitting: Interventional Radiology

## 2017-04-18 DIAGNOSIS — I639 Cerebral infarction, unspecified: Secondary | ICD-10-CM

## 2017-04-21 ENCOUNTER — Encounter: Payer: Self-pay | Admitting: Occupational Therapy

## 2017-04-21 ENCOUNTER — Encounter: Payer: Self-pay | Admitting: Physical Therapy

## 2017-04-21 ENCOUNTER — Ambulatory Visit: Payer: Medicare Other | Admitting: Physical Therapy

## 2017-04-21 ENCOUNTER — Ambulatory Visit: Payer: Medicare Other | Admitting: Occupational Therapy

## 2017-04-21 DIAGNOSIS — R2681 Unsteadiness on feet: Secondary | ICD-10-CM | POA: Diagnosis not present

## 2017-04-21 DIAGNOSIS — M6281 Muscle weakness (generalized): Secondary | ICD-10-CM

## 2017-04-21 DIAGNOSIS — R278 Other lack of coordination: Secondary | ICD-10-CM

## 2017-04-21 DIAGNOSIS — R471 Dysarthria and anarthria: Secondary | ICD-10-CM | POA: Diagnosis not present

## 2017-04-21 DIAGNOSIS — I639 Cerebral infarction, unspecified: Secondary | ICD-10-CM | POA: Diagnosis not present

## 2017-04-21 NOTE — Therapy (Signed)
[facility] MAIN [facility] SERVICES [address] [place], [place], [address] Phone: [phone]   Fax:  [phone]  Physical Therapy Treatment  Patient Details  Name: Jason Nolan MRN: [id number] Date of Birth: 05/17/1945 Referring Provider: [name]   Encounter Date: 04/21/2017  PT End of Session - 04/21/17 1650    Visit Number  19    Number of Visits  25    Date for PT Re-Evaluation  04/21/17    Authorization Type  g code 9/10    PT Start Time  0245    PT Stop Time  0330    PT Time Calculation (min)  45 min    Equipment Utilized During Treatment  Gait belt    Activity Tolerance  Patient tolerated treatment well    Behavior During Therapy  [facility] for tasks assessed/performed       Past Medical History:  Diagnosis Date  . Glaucoma   . Hepatitis C   . Stroke [facility])     Past Surgical History:  Procedure Laterality Date  . IR ANGIO INTRA EXTRACRAN SEL COM CAROTID INNOMINATE UNI L MOD SED  11/25/2016  . IR ANGIO VERTEBRAL SEL SUBCLAVIAN INNOMINATE UNI R MOD SED  11/25/2016  . IR ANGIO VERTEBRAL SEL VERTEBRAL UNI L MOD SED  11/25/2016  . IR INTRAVSC STENT CERV CAROTID W/O EMB-PROT MOD SED INC ANGIO  11/25/2016  . IR PERCUTANEOUS ART THROMBECTOMY/INFUSION INTRACRANIAL INC DIAG ANGIO  11/25/2016  . IR RADIOLOGIST EVAL & MGMT  02/13/2017  . RADIOLOGY WITH ANESTHESIA N/A 11/25/2016   Procedure: RADIOLOGY WITH ANESTHESIA;  Surgeon: Radiologist, Medication, MD;  Location: [facility] OR;  Service: Radiology;  Laterality: N/A;    There were no vitals filed for this visit.  Subjective Assessment - 04/21/17 1502    Subjective  Patinet is doing fine today.No new concerns.    Pertinent History  Pt. is a 71 y.o. 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 Pt. was transferred to [facility]. He underwent CTA brain showed short segment near occlusion of proximal R-ICA with associated intraluminal thrombus --likely due to dissection  and emergent large vessel occlusion with right M2 occlusion and evolving right frontal lobe infarct.  He underwent cerebral angio with  complete revascularization of occluded superior division of R-MCA and near complete revascularization of proximal R-ICA with stent assisted angioplasty.  Stroke felt to be embolic in setting of ruptured plaque R-ICA and he was placed on ASA and brillinta due to stent. Pt. was transferred to inpatient rehab for several weeks, had home health therapy upon discharge.    Limitations  Walking;Lifting;House hold activities    How long can you sit comfortably?  unlimited     How long can you stand comfortably?  unlimited     How long can you walk comfortably?  unsure    Patient Stated Goals  "strengthen my whole L side"    Currently in Pain?  No/denies    Pain Score  0-No pain    Multiple Pain Sites  No       Neuromuscular training:  Tandem stand on 2 disks, with head turns x 2 minutes, CGA  Stepping onto AIREX, then on to 4 inch step followed by stepping down onto AIREX and then level surface in //bars x15.  Pt required occasional UE assist, and performance improved with each repetition    Stepping over and back x`10 bilaterally with verbal cueing to perform exercise as fast  as possible  Side step and back x10 bilaterally with verbal cueing to perform exercise as fast as possible  2 disk in stepping pattern; head turns left and right and cues to not use UE support  Tapping from purple pad to target for single leg balance training with cues not to use UE support and for correct posture, CGA  Rocker board left and right without UE support ,SBA  Matrix machine and fwd/bwd walking with 12.5 lbs and CGA, red mat and step stoo   foam with feet together and CGA without UE support   There ex:   Mini squats in //bars x 15 x 2   Eccentric step downs from 6 inch stool x 5 left and right x 4 sets   Single leg bridge x 5 Left and right , difficult to raise up  buttocks                          PT Short Term Goals - 04/03/17 1344      PT SHORT TERM GOAL #1   Title  Pt will improve 5x sit to stand to 18 seconds in order to demonstrate improved LE strength and improved dynamic balance.    Baseline  21 sec; 02/26/17: 16.1s    Time  6    Period  Weeks    Status  Achieved      PT SHORT TERM GOAL #2   Title  Pt will improve gait speed to at least 1.0 m/s with proper heel strike and decreased hip hike in order to demonstrate imrpoved LE strength and balance.    Baseline  0.83 m/s; 02/26/17: 1.11 m/s    Time  6    Period  Weeks    Status  Achieved      PT SHORT TERM GOAL #3   Title  Pt will improve Berg Balance score to 51/56 in order to demonstrate improved dynamic and static balance, decreasing overall falls risk.    Baseline  48/56; 02/26/17: 53/56    Time  6    Period  Weeks    Status  Achieved        PT Long Term Goals - 04/03/17 1343      PT LONG TERM GOAL #1   Title  Pt will demonstrate independence with HEP in order to continue LE strengthening and manage symptoms.    Time  12    Period  Weeks    Status  On-going    Target Date  04/21/17      PT LONG TERM GOAL #2   Title  Pt will improve 5x sit to stand to <15 seconds in order to demonstrate improved LE strength and dynamic balance.    Baseline  21 sec; 02/26/17: 16.1s    Time  12    Period  Weeks    Status  On-going    Target Date  04/21/17      PT LONG TERM GOAL #3   Title  Pt will demonstrate gait speed of at least 1.2 m/s in order to demonstrate improved LE strength and improved balance in order to return to prior level of funciton.    Baseline  0.833 m/s 02/26/17: 1.11 m/s    Time  12    Period  Weeks    Status  On-going    Target Date  04/21/17      PT LONG TERM GOAL #4   Title  Pt will  improve Berg Balance score to at least 54/56 in order demonstrate improved dynamic and static balance, decreasing overall falls risk.    Baseline  48/56;  02/26/17: 53/56    Time  12    Period  Weeks    Status  On-going    Target Date  04/21/17      PT LONG TERM GOAL #5   Title  Pt will demonstrate improved LE strength to 4+/5 in all limited planes in order to return to prior level of function.    Baseline  gross strength: 4/5; 02/26/17: deferred strength testing    Time  12    Period  Weeks    Status  On-going    Target Date  04/21/17      PT LONG TERM GOAL #6   Title  Pt will improve 6MWT to >1500 feet without using AD in order to return to prior level of function, demonstrating improvements in LE strength, balance and endurance.    Baseline  1205 ft without AD; 02/26/17: deferred to next session    Time  12    Period  Weeks    Status  On-going            Plan - 04/21/17 1651    Clinical Impression Statement  Patient demonstrates good postural control in tandem and narrow stance on 2 disc with EO and EC. Patient demonstrated hesitation with full weight bearing during lunge but minimal cueing resulted in good technique and no LOB. Patient improved ability to perform step with matrix weight with supervision and without UE assist today. Patient shows good BLE strength and hip control during squats.  Patient will continue to benefit from skilled physical therapy to improve endurance and dynamic balance to reduce fall risk.    Rehab Potential  Good    Clinical Impairments Affecting Rehab Potential  high prior level of function    PT Frequency  2x / week    PT Duration  12 weeks    PT Treatment/Interventions  ADLs/Self Care Home Management;Cryotherapy;Moist Heat;Gait training;Stair training;Functional mobility training;Therapeutic activities;Therapeutic exercise;Balance training;Neuromuscular re-education;Patient/family education;Manual techniques    PT Next Visit Plan  6MWT, MMT, update those specific goals, continue strength/balance;    PT Home Exercise Plan  Add green theraband to HEP exercises    Consulted and Agree with Plan of  Care  Patient       Patient will benefit from skilled therapeutic intervention in order to improve the following deficits and impairments:  Abnormal gait, Decreased balance, Decreased strength, Difficulty walking, Increased edema  Visit Diagnosis: Muscle weakness (generalized)  Other lack of coordination  Unsteadiness on feet     Problem List Patient Active Problem List   Diagnosis Date Noted  . Left hemiparesis (HCC)   . Essential hypertension 11/29/2016  . 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 Hepatitis C 07/22/2013    Ezekiel InaMansfield, Kristine S, [place] DPT 04/21/2017, 4:51 PM  [facility] [address] [place], [place], [address] Phone: [phone]   Fax:  [phone]  Name: Jason Nolan MRN: [id number] Date of Birth: 1946-02-28

## 2017-04-21 NOTE — Therapy (Signed)
[facility] [address] [place], [place], [address] Phone: [phone]   Fax:  [phone]  Occupational Therapy Treatment  Patient Details  Name: Jason CroakJames Nolan MRN: [id number] Date of Birth: Sep 22, 1945 Referring Provider (Historical): Dr. Wynn BankerKirsteins   Encounter Date: 04/21/2017  OT End of Session - 04/21/17 1451    Visit Number  14    Number of Visits  24    Date for OT Re-Evaluation  07/09/17    Authorization Type  Medicare G-Code 4    OT Start Time  1400    OT Stop Time  1445    OT Time Calculation (min)  45 min    Activity Tolerance  Patient tolerated treatment well    Behavior During Therapy  [facility] for tasks assessed/performed       Past Medical History:  Diagnosis Date  . Glaucoma   . Hepatitis C   . Stroke [facility](HCC)     Past Surgical History:  Procedure Laterality Date  . IR ANGIO INTRA EXTRACRAN SEL COM CAROTID INNOMINATE UNI L MOD SED  11/25/2016  . IR ANGIO VERTEBRAL SEL SUBCLAVIAN INNOMINATE UNI R MOD SED  11/25/2016  . IR ANGIO VERTEBRAL SEL VERTEBRAL UNI L MOD SED  11/25/2016  . IR INTRAVSC STENT CERV CAROTID W/O EMB-PROT MOD SED INC ANGIO  11/25/2016  . IR PERCUTANEOUS ART THROMBECTOMY/INFUSION INTRACRANIAL INC DIAG ANGIO  11/25/2016  . IR RADIOLOGIST EVAL & MGMT  02/13/2017  . RADIOLOGY WITH ANESTHESIA N/A 11/25/2016   Procedure: RADIOLOGY WITH ANESTHESIA;  Surgeon: Radiologist, Medication, MD;  Location: [facility] OR;  Service: Radiology;  Laterality: N/A;    There were no vitals filed for this visit.  Subjective Assessment - 04/21/17 1449    Subjective   Pt. reports he has been workin gwoth his Left hand.    Patient is accompained by:  Family member    Pertinent History  Pt. is a 71 y.o. 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 [facility]. He underwent CTA brain showed short segment near occlusion of proximal R-ICA with associated intraluminal thrombus --likely due to  dissection and emergent large vessel occlusion with right M2 occlusion and evolving right frontal lobe infarct.  He underwent cerebral angio with  complete revascularization of occluded superior division of R-MCA and near complete revascularization of proximal R-ICA with stent assisted angioplasty.  Stroke felt to be embolic in setting of ruptured plaque R-ICA and he was placed on ASA and brillinta due to stent. Pt. was transferred to inpatient rehab for several weeks, had home health therapy upon discharge, and now is ready for outpatient therapy services.    Currently in Pain?  No/denies         [facility] - 04/21/17 1416      Edema - Water Displacement (mL)   Left Hand  -- Left wrist: 19.5 cm, MPs: 24 cm       OT TREATMENT    Neuro muscular re-education:  Pt. worked on thumb opposition. Pt. worked on tasks to focus on grading/Accommodating grip/grasp to varying weights of object as pt. Has difficulty grasping light objects.   Therapeutic Exercise:  Pt. worked on the [employer] for 8 min. with constant monitoring of the BUEs. Pt. Worked on changing, and alternating forward reverse position every 2 min. Rest breaks were required. Pt. Worked on level 4.0 Pt. performed gross gripping with grip strengthener. Pt. worked on sustaining grip while grasping pegs and  reaching at various heights. Gripper was placed in the 3rdresistive slot with the white resistive spring. Pt. Worked on digit extension.                      OT Education - 04/21/17 1451    Education provided  Yes    Education Details  UE hand function    Person(s) Educated  Patient    Methods  Explanation    Comprehension  Verbalized understanding          OT Long Term Goals - 04/16/17 1633      OT LONG TERM GOAL #1   Title  Pt. will improve edema by 2 cm through MPs in preparation for ROM.    Baseline  improving    Time  12    Period  Weeks    Status  On-going    Target Date  07/09/17       OT LONG TERM GOAL #2   Title  Pt. will improve Left hand digit extension in preparation for weightbearing, and releasing objects during ADLs/IADLs.    Baseline  Pt. is unable to achieve full digit extension on the left.    Time  12    Period  Weeks    Status  On-going    Target Date  07/09/17      OT LONG TERM GOAL #3   Title  Pt. left hand grip with  increase by 5# in preparation for holding a cup.    Baseline  Left 2#, RIght: 60    Time  12    Period  Weeks    Status  On-going    Target Date  07/09/17      OT LONG TERM GOAL #4   Title  Pt. will increased right shoulder strength by 1 mm grade to assist with ADLs.    Baseline  Impaired LUE strength    Time  12    Period  Weeks    Target Date  07/09/17      OT LONG TERM GOAL #5   Title  Pt. will increase left wrist extension in preparation for reaching ADL items.    Baseline  wrist extension 28 degrees    Time  12    Period  Weeks    Status  On-going    Target Date  07/09/17      OT LONG TERM GOAL #6   Title  Pt. will improve left hand coordination skills to be able to button clothing.    Baseline  Pt. conitnues to have difficulty    Time  12    Period  Weeks    Status  On-going    Target Date  07/09/17      OT LONG TERM GOAL #7   Title  Pt. will independently demonstrate visual compensatory strategies duirng ADLs, and IADLs.    Baseline  Glaucoma    Time  12    Period  Weeks    Status  On-going    Target Date  07/09/17            Plan - 04/21/17 1454    Clinical Impression Statement  Pt. conitnues to work with his hand more when his is not in therapy. Pt. presented with decreased edema today, with improvements in measurements. Pt. is improving with gross digit extension, however continues to work on isolating his 2nd digit during tasks., and grading/acccomodating his left hand to with weight of an object.  pt. continues to work on improving UE strength, and coordination skills for improved ADLs, and IADLs.     Occupational performance deficits (Please refer to evaluation for details):  ADL's;IADL's    Rehab Potential  Excellent    OT Frequency  2x / week    OT Duration  12 weeks    OT Treatment/Interventions  Self-care/ADL training;Therapeutic exercise;Neuromuscular education;Patient/family education;Energy conservation;Therapeutic activities;DME and/or AE instruction    Clinical Decision Making  Multiple treatment options, significant modification of task necessary    Consulted and Agree with Plan of Care  Patient       Patient will benefit from skilled therapeutic intervention in order to improve the following deficits and impairments:  Abnormal gait, Pain, Decreased strength, Decreased range of motion, Impaired UE functional use, Decreased coordination, Decreased knowledge of use of DME, Decreased activity tolerance, Difficulty walking, Decreased balance, Other (comment)  Visit Diagnosis: Muscle weakness (generalized)  Other lack of coordination    Problem List Patient Active Problem List   Diagnosis Date Noted  . Left hemiparesis (HCC)   . Essential hypertension 11/29/2016  . 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 Hepatitis C 07/22/2013    Olegario Messier, MS, OTR/L 04/21/2017, 3:50 PM  [place] [facility] MAIN [facility] SERVICES [address] [place], [place], [address] Phone: [phone]   Fax:  [phone]  Name: Demaree Liberto MRN: [id number] Date of Birth: 1946/02/06

## 2017-04-23 ENCOUNTER — Other Ambulatory Visit: Payer: Self-pay | Admitting: Internal Medicine

## 2017-04-23 ENCOUNTER — Ambulatory Visit: Payer: Medicare Other | Admitting: Physical Therapy

## 2017-04-23 ENCOUNTER — Encounter: Payer: Self-pay | Admitting: Physical Therapy

## 2017-04-23 ENCOUNTER — Encounter: Payer: Self-pay | Admitting: Occupational Therapy

## 2017-04-23 ENCOUNTER — Ambulatory Visit: Payer: Medicare Other | Admitting: Occupational Therapy

## 2017-04-23 DIAGNOSIS — I639 Cerebral infarction, unspecified: Secondary | ICD-10-CM | POA: Diagnosis not present

## 2017-04-23 DIAGNOSIS — R471 Dysarthria and anarthria: Secondary | ICD-10-CM | POA: Diagnosis not present

## 2017-04-23 DIAGNOSIS — R2681 Unsteadiness on feet: Secondary | ICD-10-CM

## 2017-04-23 DIAGNOSIS — R278 Other lack of coordination: Secondary | ICD-10-CM | POA: Diagnosis not present

## 2017-04-23 DIAGNOSIS — M6281 Muscle weakness (generalized): Secondary | ICD-10-CM

## 2017-04-23 DIAGNOSIS — I10 Essential (primary) hypertension: Secondary | ICD-10-CM

## 2017-04-23 NOTE — Therapy (Signed)
[facility] MAIN [facility] SERVICES [address] [place], [place], [address] Phone: [phone]   Fax:  [phone]  Occupational Therapy Treatment  Patient Details  Name: Jason Nolan MRN: [id number] Date of Birth: [birth year] Referring Provider (Historical): Dr. Wynn Banker   Encounter Date: 04/23/2017  OT End of Session - 04/23/17 1733    Visit Number  15    Number of Visits  24    Date for OT Re-Evaluation  07/09/17    Authorization Type  Medicare G-Code 5    OT Start Time  1445    OT Stop Time  1530    OT Time Calculation (min)  45 min    Activity Tolerance  Patient tolerated treatment well    Behavior During Therapy  [facility] [place] for tasks assessed/performed       Past Medical History:  Diagnosis Date  . Glaucoma   . Hepatitis C   . Stroke [facility])     Past Surgical History:  Procedure Laterality Date  . IR ANGIO INTRA EXTRACRAN SEL COM CAROTID INNOMINATE UNI L MOD SED  11/25/2016  . IR ANGIO VERTEBRAL SEL SUBCLAVIAN INNOMINATE UNI R MOD SED  11/25/2016  . IR ANGIO VERTEBRAL SEL VERTEBRAL UNI L MOD SED  11/25/2016  . IR INTRAVSC STENT CERV CAROTID W/O EMB-PROT MOD SED INC ANGIO  11/25/2016  . IR PERCUTANEOUS ART THROMBECTOMY/INFUSION INTRACRANIAL INC DIAG ANGIO  11/25/2016  . IR RADIOLOGIST EVAL & MGMT  02/13/2017  . RADIOLOGY WITH ANESTHESIA N/A 11/25/2016   Procedure: RADIOLOGY WITH ANESTHESIA;  Surgeon: Radiologist, Medication, MD;  Location: [facility] OR;  Service: Radiology;  Laterality: N/A;    There were no vitals filed for this visit.  Subjective Assessment - 04/23/17 1732    Subjective   Pt. reports he can tell he is making progress with his Left hand.    Patient is accompained by:  Family member    Pertinent History  Pt. is a 71 y.o. 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 Pt. was transferred to [facility]. He underwent CTA brain showed short segment near occlusion of proximal R-ICA with associated intraluminal thrombus --likely  due to dissection and emergent large vessel occlusion with right M2 occlusion and evolving right frontal lobe infarct.  He underwent cerebral angio with  complete revascularization of occluded superior division of R-MCA and near complete revascularization of proximal R-ICA with stent assisted angioplasty.  Stroke felt to be embolic in setting of ruptured plaque R-ICA and he was placed on ASA and brillinta due to stent. Pt. was transferred to inpatient rehab for several weeks, had home health therapy upon discharge, and now is ready for outpatient therapy services.    Limitations  Dominant LUE functioning, left hand edema, distal ROM, coordination, and overal strength.    Currently in Pain?  No/denies       OT TREATMENT    Neuro muscular re-education:  Pt. Worked on grasping 1" cubes and placing them onto a vertical board, and isolating his 2nd digit to press them into place. Pt. Required cues for alternating weightbearing through his hand between each rep.  Therapeutic Exercise:  Pt. Worked on the [employer] for 8 min. With constant monitoring of the BUEs. Pt. Worked on changing, and alternating forward reverse position every 2 min. Rest breaks were required. Pt. Worked on level 4.0 and modified to level 5.0 half way through. Pt. performed gross gripping with grip strengthener. Pt. worked on sustaining grip while grasping pegs and reaching  at various heights. Gripper was placed in the 2nd resistive slot, and upgraded to the 3rd with the white resistive spring. Pt. Worked on pinch strengthening in the left hand for lateral, and 3pt. pinch using yellow, red, and green resistive clips. Pt. worked on placing the clips at various vertical and horizontal angles. Tactile and verbal cues were required for eliciting the desired movement.                          OT Long Term Goals - 04/16/17 1633      OT LONG TERM GOAL #1   Title  Pt. will improve edema by 2 cm through MPs in  preparation for ROM.    Baseline  improving    Time  12    Period  Weeks    Status  On-going    Target Date  07/09/17      OT LONG TERM GOAL #2   Title  Pt. will improve Left hand digit extension in preparation for weightbearing, and releasing objects during ADLs/IADLs.    Baseline  Pt. is unable to achieve full digit extension on the left.    Time  12    Period  Weeks    Status  On-going    Target Date  07/09/17      OT LONG TERM GOAL #3   Title  Pt. left hand grip with  increase by 5# in preparation for holding a cup.    Baseline  Left 2#, RIght: 60    Time  12    Period  Weeks    Status  On-going    Target Date  07/09/17      OT LONG TERM GOAL #4   Title  Pt. will increased right shoulder strength by 1 mm grade to assist with ADLs.    Baseline  Impaired LUE strength    Time  12    Period  Weeks    Target Date  07/09/17      OT LONG TERM GOAL #5   Title  Pt. will increase left wrist extension in preparation for reaching ADL items.    Baseline  wrist extension 28 degrees    Time  12    Period  Weeks    Status  On-going    Target Date  07/09/17      OT LONG TERM GOAL #6   Title  Pt. will improve left hand coordination skills to be able to button clothing.    Baseline  Pt. conitnues to have difficulty    Time  12    Period  Weeks    Status  On-going    Target Date  07/09/17      OT LONG TERM GOAL #7   Title  Pt. will independently demonstrate visual compensatory strategies duirng ADLs, and IADLs.    Baseline  Glaucoma    Time  12    Period  Weeks    Status  On-going    Target Date  07/09/17            Plan - 04/23/17 1734    Clinical Impression Statement  Pt. reports he works at home with his Left hand daily. Pt. is improving with edema in the left hand. Pt. is improving with strength, however continues to present with limited cordination skills, and difficulty isolating his 2nd digit. duirng tasks.    Occupational performance deficits (Please refer to  evaluation for details):  ADL's;IADL's    Rehab Potential  Excellent    OT Frequency  2x / week    OT Duration  12 weeks    OT Treatment/Interventions  Self-care/ADL training;Therapeutic exercise;Neuromuscular education;Patient/family education;Energy conservation;Therapeutic activities;DME and/or AE instruction    Clinical Decision Making  Multiple treatment options, significant modification of task necessary    Consulted and Agree with Plan of Care  Patient       Patient will benefit from skilled therapeutic intervention in order to improve the following deficits and impairments:  Abnormal gait, Pain, Decreased strength, Decreased range of motion, Impaired UE functional use, Decreased coordination, Decreased knowledge of use of DME, Decreased activity tolerance, Difficulty walking, Decreased balance, Other (comment)  Visit Diagnosis: Muscle weakness (generalized)  Other lack of coordination    Problem List Patient Active Problem List   Diagnosis Date Noted  . Left hemiparesis (HCC)   . Essential hypertension 11/29/2016  . 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 Hepatitis C 07/22/2013    Olegario MessierElaine Nashla Althoff, MS, OTR/L 04/23/2017, 5:39 PM  [place] [facility] [address] [place], [place], [address] Phone: [phone]   Fax:  [phone]  Name: Terrilee CroakJames Hollingworth MRN: [id number] Date of Birth: 1945-10-15

## 2017-04-23 NOTE — Therapy (Signed)
[facility] MAIN [facility] SERVICES [address] [place], [place], [address] Phone: [phone]   Fax:  [phone]  Physical Therapy Treatment/ progress Note/re-certification  Patient Details  Name: Jason Nolan MRN: [id number] Date of Birth: Nov 28, 1945 Referring Provider: [name]   Encounter Date: 04/23/2017  PT End of Session - 04/23/17 1413    Visit Number  20    Number of Visits  46   Date for PT Re-Evaluation  06/16/17   Authorization Type  g code 10/10    PT Start Time  0200    PT Stop Time  0245    PT Time Calculation (min)  45 min    Equipment Utilized During Treatment  Gait belt    Activity Tolerance  Patient tolerated treatment well    Behavior During Therapy  [facility] for tasks assessed/performed       Past Medical History:  Diagnosis Date  . Glaucoma   . Hepatitis C   . Stroke [facility])     Past Surgical History:  Procedure Laterality Date  . IR ANGIO INTRA EXTRACRAN SEL COM CAROTID INNOMINATE UNI L MOD SED  11/25/2016  . IR ANGIO VERTEBRAL SEL SUBCLAVIAN INNOMINATE UNI R MOD SED  11/25/2016  . IR ANGIO VERTEBRAL SEL VERTEBRAL UNI L MOD SED  11/25/2016  . IR INTRAVSC STENT CERV CAROTID W/O EMB-PROT MOD SED INC ANGIO  11/25/2016  . IR PERCUTANEOUS ART THROMBECTOMY/INFUSION INTRACRANIAL INC DIAG ANGIO  11/25/2016  . IR RADIOLOGIST EVAL & MGMT  02/13/2017  . RADIOLOGY WITH ANESTHESIA N/A 11/25/2016   Procedure: RADIOLOGY WITH ANESTHESIA;  Surgeon: Radiologist, Medication, MD;  Location: [facility] OR;  Service: Radiology;  Laterality: N/A;    There were no vitals filed for this visit.  Subjective Assessment - 04/23/17 1411    Subjective  Patinet is doing fine today.No new concerns.    Pertinent History  Pt. is a 71 y.o. 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 Pt. was transferred to [facility]. He underwent CTA brain showed short segment near occlusion of proximal R-ICA with associated intraluminal thrombus  --likely due to dissection and emergent large vessel occlusion with right M2 occlusion and evolving right frontal lobe infarct.  He underwent cerebral angio with  complete revascularization of occluded superior division of R-MCA and near complete revascularization of proximal R-ICA with stent assisted angioplasty.  Stroke felt to be embolic in setting of ruptured plaque R-ICA and he was placed on ASA and brillinta due to stent. Pt. was transferred to inpatient rehab for several weeks, had home health therapy upon discharge.    Limitations  Walking;Lifting;House hold activities    How long can you sit comfortably?  unlimited     How long can you stand comfortably?  unlimited     How long can you walk comfortably?  unsure    Patient Stated Goals  "strengthen my whole L side"    Currently in Pain?  No/denies    Pain Score  0-No pain       Neuromuscular training:  Balance master limits of stability with weight shifts fwd, bwd, center left and center right x 2 mins each  Rhythmic weight shift lateral left and right and fwd/bwd x 2 mins each each trial with increasing speed from slow to moderate to fast  Limits of stability with squats x 2 mins  Variable foot plate and variable surroundings with 20 % difficulty and weight shifts center left x 2 mins ,  with speed ranging from slow to moderate to fast  Patient needs CGA and has loss of balance during faster mode and greater variability    Goals were reviewed and outcome measures performed today.                    PT Education - 04/23/17 1412    Education provided  Yes    Education Details  shifting weight and balance techniques    Person(s) Educated  Patient    Methods  Explanation;Demonstration    Comprehension  Verbalized understanding;Returned demonstration       PT Short Term Goals - 04/23/17 1442      PT SHORT TERM GOAL #1   Title  Pt will improve 5x sit to stand to 18 seconds in order to demonstrate  improved LE strength and improved dynamic balance.    Baseline  21 sec; 02/26/17: 16.1s    Time  6    Period  Weeks    Status  Achieved      PT SHORT TERM GOAL #2   Title  Pt will improve gait speed to at least 1.0 m/s with proper heel strike and decreased hip hike in order to demonstrate imrpoved LE strength and balance.    Baseline  0.83 m/s; 02/26/17: 1.11 m/s    Time  6    Period  Weeks    Status  Achieved      PT SHORT TERM GOAL #3   Title  Pt will improve Berg Balance score to 51/56 in order to demonstrate improved dynamic and static balance, decreasing overall falls risk.    Baseline  48/56; 02/26/17: 53/56    Time  6    Period  Weeks    Status  Achieved        PT Long Term Goals - 04/23/17 1434      PT LONG TERM GOAL #1   Title  Pt will demonstrate independence with HEP in order to continue LE strengthening and manage symptoms.    Time  12    Period  Weeks    Status  On-going    Target Date  06/16/17      PT LONG TERM GOAL #2   Title  Pt will improve 5x sit to stand to <15 seconds in order to demonstrate improved LE strength and dynamic balance.    Baseline  21 sec; 02/26/17: 16.1s 16.50 sec 04/23/17    Time  12    Period  Weeks    Status  On-going    Target Date  06/16/17      PT LONG TERM GOAL #3   Title  Pt will demonstrate gait speed of at least 1.2 m/s in order to demonstrate improved LE strength and improved balance in order to return to prior level of funciton.    Baseline  0.833 m/s 02/26/17: 1.11 m/s, 12/189/18 1.16 m/sec    Time  12    Period  Weeks    Status  On-going    Target Date  06/16/17      PT LONG TERM GOAL #4   Title  Pt will improve Berg Balance score to at least 54/56 in order demonstrate improved dynamic and static balance, decreasing overall falls risk.    Baseline  48/56; 02/26/17: 53/56; 04/23/17  53/56    Time  12    Period  Weeks    Status  On-going    Target Date  06/16/17  PT LONG TERM GOAL #5   Title  Pt will  demonstrate improved LE strength to 4+/5 in all limited planes in order to return to prior level of function.    Baseline  gross strength: 4/5; 02/26/17: deferred strength testing    Time  12    Period  Weeks    Status  On-going    Target Date  06/16/17      PT LONG TERM GOAL #6   Title  Pt will improve 6MWT to >1500 feet without using AD in order to return to prior level of function, demonstrating improvements in LE strength, balance and endurance.    Baseline  1205 ft without AD; 02/26/17: deferred to next session 04/23/17 1420 ft    Time  12    Period  Weeks    Status  On-going            Plan - 04/23/17 1446    Clinical Impression Statement  Patient performs rhythmic weight shift using the balance master machine and has difficulty with moderate and fast pace.He has difficulty with weight shift backwards and forward during reaching and has difficulty with regaining balance with surface changes.  He has loss of balance with foot plate shifts and surroundings movement. Patient improved in outcome meausres and goals continue to improve. Patient will benefit from continued skilled PT to improve balance and strength deficits and improve quality of life       Patient will benefit from skilled therapeutic intervention in order to improve the following deficits and impairments:     Visit Diagnosis: Other lack of coordination - Plan: PT plan of care cert/re-cert  Unsteadiness on feet - Plan: PT plan of care cert/re-cert     Problem List Patient Active Problem List   Diagnosis Date Noted  . Left hemiparesis (HCC)   . Essential hypertension 11/29/2016  . 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 Hepatitis C 07/22/2013    Ezekiel InaMansfield, Brycen Bean S, [place] DPT 04/23/2017, 2:57 PM  [place] [facility] [address] [place],  [place], [address] Phone: [phone]   Fax:  [phone]  Name: Jason CroakJames Nolan MRN: [id number] Date of Birth: [birth year]

## 2017-04-26 ENCOUNTER — Other Ambulatory Visit: Payer: Self-pay | Admitting: Internal Medicine

## 2017-04-26 DIAGNOSIS — I639 Cerebral infarction, unspecified: Secondary | ICD-10-CM

## 2017-04-30 ENCOUNTER — Ambulatory Visit: Payer: Medicare Other | Admitting: Occupational Therapy

## 2017-04-30 ENCOUNTER — Encounter: Payer: Self-pay | Admitting: Occupational Therapy

## 2017-04-30 DIAGNOSIS — R278 Other lack of coordination: Secondary | ICD-10-CM | POA: Diagnosis not present

## 2017-04-30 DIAGNOSIS — M6281 Muscle weakness (generalized): Secondary | ICD-10-CM

## 2017-04-30 DIAGNOSIS — R2681 Unsteadiness on feet: Secondary | ICD-10-CM | POA: Diagnosis not present

## 2017-04-30 DIAGNOSIS — R471 Dysarthria and anarthria: Secondary | ICD-10-CM | POA: Diagnosis not present

## 2017-04-30 DIAGNOSIS — I639 Cerebral infarction, unspecified: Secondary | ICD-10-CM | POA: Diagnosis not present

## 2017-04-30 NOTE — Therapy (Signed)
[place] [facility] MAIN [facility] SERVICES [address] [place], [place], [address] Phone: [phone]   Fax:  [phone]  Occupational Therapy Treatment  Patient Details  Name: Jason Nolan MRN: [id number] Date of Birth: [birth year] Referring Provider (Historical): Dr. Wynn Banker   Encounter Date: 04/30/2017  OT End of Session - 04/30/17 1418    Visit Number  16    Number of Visits  24    Date for OT Re-Evaluation  07/09/17    Authorization Type  Medicare G-Code 6    OT Start Time  1350    OT Stop Time  1430    OT Time Calculation (min)  40 min    Activity Tolerance  Patient tolerated treatment well    Behavior During Therapy  [facility] for tasks assessed/performed       Past Medical History:  Diagnosis Date  . Glaucoma   . Hepatitis C   . Stroke [facility])     Past Surgical History:  Procedure Laterality Date  . IR ANGIO INTRA EXTRACRAN SEL COM CAROTID INNOMINATE UNI L MOD SED  11/25/2016  . IR ANGIO VERTEBRAL SEL SUBCLAVIAN INNOMINATE UNI R MOD SED  11/25/2016  . IR ANGIO VERTEBRAL SEL VERTEBRAL UNI L MOD SED  11/25/2016  . IR INTRAVSC STENT CERV CAROTID W/O EMB-PROT MOD SED INC ANGIO  11/25/2016  . IR PERCUTANEOUS ART THROMBECTOMY/INFUSION INTRACRANIAL INC DIAG ANGIO  11/25/2016  . IR RADIOLOGIST EVAL & MGMT  02/13/2017  . RADIOLOGY WITH ANESTHESIA N/A 11/25/2016   Procedure: RADIOLOGY WITH ANESTHESIA;  Surgeon: Radiologist, Medication, MD;  Location: [facility] OR;  Service: Radiology;  Laterality: N/A;    There were no vitals filed for this visit.  Subjective Assessment - 04/30/17 1413    Subjective   Pt. reports he did not do anything for Christmas.    Patient is accompained by:  Family member    Pertinent History  Pt. is a 71 y.o. 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 Pt. was transferred to [facility]. He underwent CTA brain showed short segment near occlusion of proximal R-ICA with associated intraluminal thrombus --likely due to  dissection and emergent large vessel occlusion with right M2 occlusion and evolving right frontal lobe infarct.  He underwent cerebral angio with  complete revascularization of occluded superior division of R-MCA and near complete revascularization of proximal R-ICA with stent assisted angioplasty.  Stroke felt to be embolic in setting of ruptured plaque R-ICA and he was placed on ASA and brillinta due to stent. Pt. was transferred to inpatient rehab for several weeks, had home health therapy upon discharge, and now is ready for outpatient therapy services.    Limitations  Dominant LUE functioning, left hand edema, distal ROM, coordination, and overal strength.    Patient Stated Goals  To regain the use of his LUE.    Currently in Pain?  No/denies      OT TREATMENT    Neuromuscular re ed:   Pt. worked on grasping, flipping, turning, and stacking [place] style discs. Pt. required visual demonstration, and cues for movement patterns. Pt. worked on speed, and Engineer, agricultural.  Pt. has difficulty with turning, and flipping the discs in his hand.  Therapeutic Exercise:  Pt. Worked on the [employer] for 8 min. With constant monitoring of the BUEs. Pt. Worked on changing, and alternating forward reverse position every 2 min. Rest breaks were required.  Pt. performed gross gripping with grip strengthener. Pt. worked on sustaining grip while grasping  pegs and reaching at various heights. Gripper was placed in the 3rd resistive slot with the white resistive spring. Pt. Worked on pinch strengthening in the left hand for lateral, and 3pt. pinch using yellow, and red resistive clips. Pt. worked on placing the clips at various vertical and horizontal angles. Tactile and verbal cues were required for eliciting the desired movement.                          OT Education - 04/30/17 1417    Education provided  Yes    Education Details  UEstrength, and coordination    Person(s) Educated   Patient    Methods  Explanation;Demonstration    Comprehension  Verbalized understanding          OT Long Term Goals - 04/16/17 1633      OT LONG TERM GOAL #1   Title  Pt. will improve edema by 2 cm through MPs in preparation for ROM.    Baseline  improving    Time  12    Period  Weeks    Status  On-going    Target Date  07/09/17      OT LONG TERM GOAL #2   Title  Pt. will improve Left hand digit extension in preparation for weightbearing, and releasing objects during ADLs/IADLs.    Baseline  Pt. is unable to achieve full digit extension on the left.    Time  12    Period  Weeks    Status  On-going    Target Date  07/09/17      OT LONG TERM GOAL #3   Title  Pt. left hand grip with  increase by 5# in preparation for holding a cup.    Baseline  Left 2#, RIght: 60    Time  12    Period  Weeks    Status  On-going    Target Date  07/09/17      OT LONG TERM GOAL #4   Title  Pt. will increased right shoulder strength by 1 mm grade to assist with ADLs.    Baseline  Impaired LUE strength    Time  12    Period  Weeks    Target Date  07/09/17      OT LONG TERM GOAL #5   Title  Pt. will increase left wrist extension in preparation for reaching ADL items.    Baseline  wrist extension 28 degrees    Time  12    Period  Weeks    Status  On-going    Target Date  07/09/17      OT LONG TERM GOAL #6   Title  Pt. will improve left hand coordination skills to be able to button clothing.    Baseline  Pt. conitnues to have difficulty    Time  12    Period  Weeks    Status  On-going    Target Date  07/09/17      OT LONG TERM GOAL #7   Title  Pt. will independently demonstrate visual compensatory strategies duirng ADLs, and IADLs.    Baseline  Glaucoma    Time  12    Period  Weeks    Status  On-going    Target Date  07/09/17            Plan - 04/30/17 1704    Clinical Impression Statement  Pt. is improving edema in the LUE, and  hand. Pt. conitnues to have difficulty  with translatroy movements of the hand. Pt. continues to present with limited grip strength, pinch strength, coordination, hand function, and translatory movements of the hand.     Occupational performance deficits (Please refer to evaluation for details):  ADL's;IADL's    Rehab Potential  Excellent    OT Frequency  2x / week    OT Duration  12 weeks    OT Treatment/Interventions  Self-care/ADL training;Therapeutic exercise;Neuromuscular education;Patient/family education;Energy conservation;Therapeutic activities;DME and/or AE instruction    Clinical Decision Making  Multiple treatment options, significant modification of task necessary    Consulted and Agree with Plan of Care  Patient       Patient will benefit from skilled therapeutic intervention in order to improve the following deficits and impairments:  Abnormal gait, Pain, Decreased strength, Decreased range of motion, Impaired UE functional use, Decreased coordination, Decreased knowledge of use of DME, Decreased activity tolerance, Difficulty walking, Decreased balance, Other (comment)  Visit Diagnosis: Muscle weakness (generalized)  Other lack of coordination    Problem List Patient Active Problem List   Diagnosis Date Noted  . Left hemiparesis (HCC)   . Essential hypertension 11/29/2016  . 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 Hepatitis C 07/22/2013    Olegario MessierElaine Lizbett Garciagarcia, MS, OTR/L 04/30/2017, 5:08 PM  [place] [facility] [address] [place], [place], [address] Phone: [phone]   Fax:  [phone]  Name: Jason Nolan MRN: [id number] Date of Birth: [birth year]

## 2017-05-07 ENCOUNTER — Ambulatory Visit: Payer: Medicare Other | Attending: Internal Medicine | Admitting: Occupational Therapy

## 2017-05-07 ENCOUNTER — Encounter: Payer: Self-pay | Admitting: Occupational Therapy

## 2017-05-07 ENCOUNTER — Ambulatory Visit: Payer: Medicare Other | Admitting: Physical Therapy

## 2017-05-07 ENCOUNTER — Encounter: Payer: Self-pay | Admitting: Physical Therapy

## 2017-05-07 DIAGNOSIS — M6281 Muscle weakness (generalized): Secondary | ICD-10-CM | POA: Insufficient documentation

## 2017-05-07 DIAGNOSIS — R2681 Unsteadiness on feet: Secondary | ICD-10-CM | POA: Diagnosis not present

## 2017-05-07 DIAGNOSIS — R278 Other lack of coordination: Secondary | ICD-10-CM

## 2017-05-07 NOTE — Therapy (Signed)
[place] [facility] MAIN [facility] SERVICES [address] [place], [place], [address] Phone: [phone]   Fax:  [phone]  Physical Therapy Treatment  Patient Details  Name: Mycah Formica MRN: [id number] Date of Birth: Jan 14, 1946 Referring Provider: [name]   Encounter Date: 05/07/2017  PT End of Session - 05/07/17 1441    Visit Number  21    Number of Visits  46    Date for PT Re-Evaluation  06/16/17    Authorization Type  g code 1/10    PT Start Time  0200    PT Stop Time  0245    PT Time Calculation (min)  45 min    Equipment Utilized During Treatment  Gait belt    Activity Tolerance  Patient tolerated treatment well    Behavior During Therapy  [facility] for tasks assessed/performed       Past Medical History:  Diagnosis Date  . Glaucoma   . Hepatitis C   . Stroke [facility])     Past Surgical History:  Procedure Laterality Date  . IR ANGIO INTRA EXTRACRAN SEL COM CAROTID INNOMINATE UNI L MOD SED  11/25/2016  . IR ANGIO VERTEBRAL SEL SUBCLAVIAN INNOMINATE UNI R MOD SED  11/25/2016  . IR ANGIO VERTEBRAL SEL VERTEBRAL UNI L MOD SED  11/25/2016  . IR INTRAVSC STENT CERV CAROTID W/O EMB-PROT MOD SED INC ANGIO  11/25/2016  . IR PERCUTANEOUS ART THROMBECTOMY/INFUSION INTRACRANIAL INC DIAG ANGIO  11/25/2016  . IR RADIOLOGIST EVAL & MGMT  02/13/2017  . RADIOLOGY WITH ANESTHESIA N/A 11/25/2016   Procedure: RADIOLOGY WITH ANESTHESIA;  Surgeon: Radiologist, Medication, MD;  Location: [facility] OR;  Service: Radiology;  Laterality: N/A;    There were no vitals filed for this visit.  Subjective Assessment - 05/07/17 1440    Subjective  Patinet is doing fine today.No new concerns.    Pertinent History  Pt. is a 72 y.o. 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 Pt. was transferred to [facility]. He underwent CTA brain showed short segment near occlusion of proximal R-ICA with associated intraluminal thrombus --likely due to dissection and  emergent large vessel occlusion with right M2 occlusion and evolving right frontal lobe infarct.  He underwent cerebral angio with  complete revascularization of occluded superior division of R-MCA and near complete revascularization of proximal R-ICA with stent assisted angioplasty.  Stroke felt to be embolic in setting of ruptured plaque R-ICA and he was placed on ASA and brillinta due to stent. Pt. was transferred to inpatient rehab for several weeks, had home health therapy upon discharge.    Limitations  Walking;Lifting;House hold activities    How long can you sit comfortably?  unlimited     How long can you stand comfortably?  unlimited     How long can you walk comfortably?  unsure    Patient Stated Goals  "strengthen my whole L side"    Currently in Pain?  No/denies    Pain Score  0-No pain         Neuromuscular training:  Balance master limits of stability with weight shifts fwd, bwd, center left and center right x 2 mins each  Rhythmic weight shift lateral left and right and fwd/bwd x 2 mins eacheach trial with increasing speed from slow to moderate to fast  Limits of stability with squats x 2 mins  Variable foot plate and variable surroundings with 20 % difficulty and weight shifts center left x 2  mins, with speed ranging from slow to moderate to fast  Patient needs CGA and has loss of balance during faster mode and greater variability                      PT Education - 05/07/17 1440    Education provided  Yes    Education Details  balance training    Person(s) Educated  Patient    Methods  Explanation;Demonstration    Comprehension  Verbalized understanding;Returned demonstration       PT Short Term Goals - 04/23/17 1442      PT SHORT TERM GOAL #1   Title  Pt will improve 5x sit to stand to 18 seconds in order to demonstrate improved LE strength and improved dynamic balance.    Baseline  21 sec; 02/26/17: 16.1s    Time  6    Period   Weeks    Status  Achieved      PT SHORT TERM GOAL #2   Title  Pt will improve gait speed to at least 1.0 m/s with proper heel strike and decreased hip hike in order to demonstrate imrpoved LE strength and balance.    Baseline  0.83 m/s; 02/26/17: 1.11 m/s    Time  6    Period  Weeks    Status  Achieved      PT SHORT TERM GOAL #3   Title  Pt will improve Berg Balance score to 51/56 in order to demonstrate improved dynamic and static balance, decreasing overall falls risk.    Baseline  48/56; 02/26/17: 53/56    Time  6    Period  Weeks    Status  Achieved        PT Long Term Goals - 04/23/17 1434      PT LONG TERM GOAL #1   Title  Pt will demonstrate independence with HEP in order to continue LE strengthening and manage symptoms.    Time  12    Period  Weeks    Status  On-going    Target Date  06/16/17      PT LONG TERM GOAL #2   Title  Pt will improve 5x sit to stand to <15 seconds in order to demonstrate improved LE strength and dynamic balance.    Baseline  21 sec; 02/26/17: 16.1s 16.50 sec 04/23/17    Time  12    Period  Weeks    Status  On-going    Target Date  06/16/17      PT LONG TERM GOAL #3   Title  Pt will demonstrate gait speed of at least 1.2 m/s in order to demonstrate improved LE strength and improved balance in order to return to prior level of funciton.    Baseline  0.833 m/s 02/26/17: 1.11 m/s, 12/189/18 1.16 m/sec    Time  12    Period  Weeks    Status  On-going    Target Date  06/16/17      PT LONG TERM GOAL #4   Title  Pt will improve Berg Balance score to at least 54/56 in order demonstrate improved dynamic and static balance, decreasing overall falls risk.    Baseline  48/56; 02/26/17: 53/56; 04/23/17  53/56    Time  12    Period  Weeks    Status  On-going    Target Date  06/16/17      PT LONG TERM GOAL #5   Title  Pt  will demonstrate improved LE strength to 4+/5 in all limited planes in order to return to prior level of function.     Baseline  gross strength: 4/5; 02/26/17: deferred strength testing    Time  12    Period  Weeks    Status  On-going    Target Date  06/16/17      PT LONG TERM GOAL #6   Title  Pt will improve 6MWT to >1500 feet without using AD in order to return to prior level of function, demonstrating improvements in LE strength, balance and endurance.    Baseline  1205 ft without AD; 02/26/17: deferred to next session 04/23/17 1420 ft    Time  12    Period  Weeks    Status  On-going            Plan - 05/07/17 1441    Clinical Impression Statement  Patient performs rhythmic weight shift using the balance master machine and has difficulty with moderate and fast pace.He has difficulty with weight shift backwards and forward during reaching and has difficulty with regaining balance with surface changes. He has loss of balance with foot plate shifts and surroundings movement. Patient improved in outcome meausres and goals continue to improve. Patient will benefit from continued skilled PT to improve balance and strength deficits and improve quality of life    Rehab Potential  Good    Clinical Impairments Affecting Rehab Potential  high prior level of function    PT Frequency  2x / week    PT Duration  12 weeks    PT Treatment/Interventions  ADLs/Self Care Home Management;Cryotherapy;Moist Heat;Gait training;Stair training;Functional mobility training;Therapeutic activities;Therapeutic exercise;Balance training;Neuromuscular re-education;Patient/family education;Manual techniques    PT Next Visit Plan  6MWT, MMT, update those specific goals, continue strength/balance;    PT Home Exercise Plan  Add green theraband to HEP exercises    Consulted and Agree with Plan of Care  Patient       Patient will benefit from skilled therapeutic intervention in order to improve the following deficits and impairments:  Abnormal gait, Decreased balance, Decreased strength, Difficulty walking, Increased edema  Visit  Diagnosis: Muscle weakness (generalized)  Other lack of coordination  Unsteadiness on feet     Problem List Patient Active Problem List   Diagnosis Date Noted  . Left hemiparesis (HCC)   . Essential hypertension 11/29/2016  . 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 Hepatitis C 07/22/2013    Ezekiel InaMansfield, Maddyn Lieurance S, [place] DPT 05/07/2017, 2:53 PM  [place] [facility] [address] [place], [place], [address] Phone: [phone]   Fax:  [phone]  Name: Terrilee CroakJames Walston MRN: [id number] Date of Birth: [birth year]

## 2017-05-07 NOTE — Therapy (Signed)
[place] [facility] [address] [place], [place], [address] Phone: [phone]   Fax:  [phone]  Occupational Therapy Treatment  Patient Details  Name: Jason CroakJames Nolan MRN: [id number] Date of Birth: 07-18-1945 Referring Provider (Historical): Dr. Wynn BankerKirsteins   Encounter Date: 05/07/2017  OT End of Session - 05/07/17 1316    Visit Number  17    Number of Visits  24    Date for OT Re-Evaluation  07/09/17    OT Start Time  1300    OT Stop Time  1345    OT Time Calculation (min)  45 min    Activity Tolerance  Patient tolerated treatment well    Behavior During Therapy  [facility] for tasks assessed/performed       Past Medical History:  Diagnosis Date  . Glaucoma   . Hepatitis C   . Stroke [facility](HCC)     Past Surgical History:  Procedure Laterality Date  . IR ANGIO INTRA EXTRACRAN SEL COM CAROTID INNOMINATE UNI L MOD SED  11/25/2016  . IR ANGIO VERTEBRAL SEL SUBCLAVIAN INNOMINATE UNI R MOD SED  11/25/2016  . IR ANGIO VERTEBRAL SEL VERTEBRAL UNI L MOD SED  11/25/2016  . IR INTRAVSC STENT CERV CAROTID W/O EMB-PROT MOD SED INC ANGIO  11/25/2016  . IR PERCUTANEOUS ART THROMBECTOMY/INFUSION INTRACRANIAL INC DIAG ANGIO  11/25/2016  . IR RADIOLOGIST EVAL & MGMT  02/13/2017  . RADIOLOGY WITH ANESTHESIA N/A 11/25/2016   Procedure: RADIOLOGY WITH ANESTHESIA;  Surgeon: Radiologist, Medication, MD;  [facility]: [facility] OR;  Service: Radiology;  Laterality: N/A;    There were no vitals filed for this visit.  Subjective Assessment - 05/07/17 1314    Subjective   Pt. reports having had a nice New Years Day.    Patient is accompained by:  Family member    Pertinent History  Pt. is a 72 y.o. 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 [facility] He underwent CTA brain showed short segment near occlusion of proximal R-ICA with associated intraluminal thrombus --likely due to dissection and emergent large vessel occlusion with right  M2 occlusion and evolving right frontal lobe infarct.  He underwent cerebral angio with  complete revascularization of occluded superior division of R-MCA and near complete revascularization of proximal R-ICA with stent assisted angioplasty.  Stroke felt to be embolic in setting of ruptured plaque R-ICA and he was placed on ASA and brillinta due to stent. Pt. was transferred to inpatient rehab for several weeks, had home health therapy upon discharge, and now is ready for outpatient therapy services.    Limitations  Dominant LUE functioning, left hand edema, distal ROM, coordination, and overal strength.    Currently in Pain?  No/denies       OT TREATMENT    Neuro muscular re-education:  Pt. performed [facility] tasks using the Grooved pegboard. Pt. worked on grasping the grooved pegs from a horizontal position at the base while working to isolate his 2nd digit.   Therapeutic Exercise:  Pt. Worked on the [employer] for 8 min. With constant monitoring of the BUEs. Pt. Worked on changing, and alternating forward reverse position every 2 min. Rest breaks were required. Pt. performed gross gripping with grip strengthener. Pt. worked on sustaining grip while grasping pegs and reaching at various heights. Gripper was placed in the 3rd resistive slot with the white resistive spring. Pt. Worked on pinch strengthening in the left hand for lateral, and 3pt. pinch using  yellow, and red resistive clips. Pt. worked on placing the clips at various vertical and horizontal angles. Tactile and verbal cues were required for eliciting the desired movement.                         OT Education - 05/07/17 1316    Education provided  Yes    Education Details  UE strength, coordination    Person(s) Educated  Patient    Methods  Explanation;Demonstration    Comprehension  Verbalized understanding          OT Long Term Goals - 04/16/17 1633      OT LONG TERM GOAL #1   Title  Pt. will improve edema  by 2 cm through MPs in preparation for ROM.    Baseline  improving    Time  12    Period  Weeks    Status  On-going    Target Date  07/09/17      OT LONG TERM GOAL #2   Title  Pt. will improve Left hand digit extension in preparation for weightbearing, and releasing objects during ADLs/IADLs.    Baseline  Pt. is unable to achieve full digit extension on the left.    Time  12    Period  Weeks    Status  On-going    Target Date  07/09/17      OT LONG TERM GOAL #3   Title  Pt. left hand grip with  increase by 5# in preparation for holding a cup.    Baseline  Left 2#, RIght: 60    Time  12    Period  Weeks    Status  On-going    Target Date  07/09/17      OT LONG TERM GOAL #4   Title  Pt. will increased right shoulder strength by 1 mm grade to assist with ADLs.    Baseline  Impaired LUE strength    Time  12    Period  Weeks    Target Date  07/09/17      OT LONG TERM GOAL #5   Title  Pt. will increase left wrist extension in preparation for reaching ADL items.    Baseline  wrist extension 28 degrees    Time  12    Period  Weeks    Status  On-going    Target Date  07/09/17      OT LONG TERM GOAL #6   Title  Pt. will improve left hand coordination skills to be able to button clothing.    Baseline  Pt. conitnues to have difficulty    Time  12    Period  Weeks    Status  On-going    Target Date  07/09/17      OT LONG TERM GOAL #7   Title  Pt. will independently demonstrate visual compensatory strategies duirng ADLs, and IADLs.    Baseline  Glaucoma    Time  12    Period  Weeks    Status  On-going    Target Date  07/09/17            Plan - 05/07/17 1328    Clinical Impression Statement  Pt. reports he trying use his left hand during all tasks at home. Pt. is using his left hand to hike clothing, perform self dressing, and assist the RUE in removing items from the microwave. Pt. continues to work on improving LUE strength,  and coordination skills, and isolating his  2nd digit for improved engagement in ADLs, and IADLs.    Rehab Potential  Excellent    OT Frequency  2x / week    OT Duration  12 weeks    OT Treatment/Interventions  Self-care/ADL training;Therapeutic exercise;Neuromuscular education;Patient/family education;Energy conservation;Therapeutic activities;DME and/or AE instruction    Clinical Decision Making  Multiple treatment options, significant modification of task necessary    Consulted and Agree with Plan of Care  Patient       Patient will benefit from skilled therapeutic intervention in order to improve the following deficits and impairments:  Abnormal gait, Pain, Decreased strength, Decreased range of motion, Impaired UE functional use, Decreased coordination, Decreased knowledge of use of DME, Decreased activity tolerance, Difficulty walking, Decreased balance, Other (comment)  Visit Diagnosis: Muscle weakness (generalized)  Other lack of coordination    Problem List Patient Active Problem List   Diagnosis Date Noted  . Left hemiparesis (HCC)   . Essential hypertension 11/29/2016  . 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 Hepatitis C 07/22/2013    Olegario Messier, MS, OTR/L 05/07/2017, 1:36 PM  [place] [facility] MAIN [facility] SERVICES [address] [place], [place], [address] Phone: [phone]   Fax:  [phone]  Name: Jason Nolan MRN: [id number] Date of Birth: 1946-02-27

## 2017-05-12 ENCOUNTER — Ambulatory Visit: Payer: Medicare Other | Admitting: Physical Therapy

## 2017-05-12 ENCOUNTER — Encounter: Payer: Self-pay | Admitting: Physical Therapy

## 2017-05-12 ENCOUNTER — Encounter: Payer: Self-pay | Admitting: Occupational Therapy

## 2017-05-12 ENCOUNTER — Ambulatory Visit: Payer: Medicare Other | Admitting: Occupational Therapy

## 2017-05-12 DIAGNOSIS — R278 Other lack of coordination: Secondary | ICD-10-CM

## 2017-05-12 DIAGNOSIS — R2681 Unsteadiness on feet: Secondary | ICD-10-CM

## 2017-05-12 DIAGNOSIS — M6281 Muscle weakness (generalized): Secondary | ICD-10-CM | POA: Diagnosis not present

## 2017-05-12 NOTE — Therapy (Signed)
[facility] [address] [place], [place], [address] Phone: [phone]   Fax:  [phone]  Occupational Therapy Treatment  Patient Details  Name: Jason Nolan MRN: [id number] Date of Birth: 28-Nov-1945 Referring Provider (Historical): Dr. Wynn BankerKirsteins   Encounter Date: 05/12/2017  OT End of Session - 05/12/17 1716    Visit Number  18    Number of Visits  24    Date for OT Re-Evaluation  07/09/17    OT Start Time  1530    OT Stop Time  1615    OT Time Calculation (min)  45 min    Activity Tolerance  Patient tolerated treatment well    Behavior During Therapy  [facility] for [facility]/performed       Past Medical History:  Diagnosis Date  . Glaucoma   . Hepatitis C   . Stroke [facility](HCC)     Past Surgical History:  Procedure Laterality Date  . IR ANGIO INTRA EXTRACRAN SEL COM CAROTID INNOMINATE UNI L MOD SED  11/25/2016  . IR ANGIO VERTEBRAL SEL SUBCLAVIAN INNOMINATE UNI R MOD SED  11/25/2016  . IR ANGIO VERTEBRAL SEL VERTEBRAL UNI L MOD SED  11/25/2016  . IR INTRAVSC STENT CERV CAROTID W/O EMB-PROT MOD SED INC ANGIO  11/25/2016  . IR PERCUTANEOUS ART THROMBECTOMY/INFUSION INTRACRANIAL INC DIAG ANGIO  11/25/2016  . IR RADIOLOGIST EVAL & MGMT  02/13/2017  . RADIOLOGY WITH ANESTHESIA N/A 11/25/2016   Procedure: RADIOLOGY WITH ANESTHESIA;  Surgeon: Radiologist, Medication, MD;  Location: [facility] OR;  Service: Radiology;  Laterality: N/A;    There were no vitals filed for this visit.  Subjective Assessment - 05/12/17 1714    Subjective   Pt. reports things have been quiet at home.    Patient is accompained by:  Family member    Pertinent History  Pt. is a 72 y.o. 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 [facility] He underwent CTA brain showed short segment near occlusion of proximal R-ICA with associated intraluminal thrombus --likely due to dissection and emergent large vessel occlusion with right  M2 occlusion and evolving right frontal lobe infarct.  He underwent cerebral angio with  complete revascularization of occluded superior division of R-MCA and near complete revascularization of proximal R-ICA with stent assisted angioplasty.  Stroke felt to be embolic in setting of ruptured plaque R-ICA and he was placed on ASA and brillinta due to stent. Pt. was transferred to inpatient rehab for several weeks, had home health therapy upon discharge, and now is ready for outpatient therapy services.    Patient Stated Goals  To regain the use of his LUE.      OT TREATMENT    Neuro muscular re-education:  Pt. worked on grasping 1" resistive cubes using his thumb, and 2nd digit. Pt. worked on isolating his 2nd digit to press them into place on the board. Pt. Hand difficulty maintaining the grasp on the cube while pulling against resistance.  Therapeutic Exercise:  Pt. worked on the [employer] for 8 min. With constant monitoring of the BUEs. Pt. worked on changing, and alternating forward reverse position every 2 min. rest breaks were required. Pt. performed gross gripping with grip strengthener. Pt. worked on sustaining grip while grasping pegs and reaching at various heights. Gripper was placed in the 2nd resistive slot with the white resistive spring.Pt. worked on pinch strengthening in the left hand for lateral, and 3pt. pinch using yellow, and red  resistive clips. Pt. worked on placing the clips at various vertical and horizontal angles. Tactile and verbal cues were required for eliciting the desired movement.                             OT Education - 05/12/17 1715    Education provided  Yes    Education Details  [facility], ther. ex    Person(s) Educated  Patient    Methods  Explanation    Comprehension  Verbalized understanding          OT Long Term Goals - 04/16/17 1633      OT LONG TERM GOAL #1   Title  Pt. will improve edema by 2 cm through MPs in preparation for  ROM.    Baseline  improving    Time  12    Period  Weeks    Status  On-going    Target Date  07/09/17      OT LONG TERM GOAL #2   Title  Pt. will improve Left hand digit extension in preparation for weightbearing, and releasing objects during ADLs/IADLs.    Baseline  Pt. is unable to achieve full digit extension on the left.    Time  12    Period  Weeks    Status  On-going    Target Date  07/09/17      OT LONG TERM GOAL #3   Title  Pt. left hand grip with  increase by 5# in preparation for holding a cup.    Baseline  Left 2#, RIght: 60    Time  12    Period  Weeks    Status  On-going    Target Date  07/09/17      OT LONG TERM GOAL #4   Title  Pt. will increased right shoulder strength by 1 mm grade to assist with ADLs.    Baseline  Impaired LUE strength    Time  12    Period  Weeks    Target Date  07/09/17      OT LONG TERM GOAL #5   Title  Pt. will increase left wrist extension in preparation for reaching ADL items.    Baseline  wrist extension 28 degrees    Time  12    Period  Weeks    Status  On-going    Target Date  07/09/17      OT LONG TERM GOAL #6   Title  Pt. will improve left hand coordination skills to be able to button clothing.    Baseline  Pt. conitnues to have difficulty    Time  12    Period  Weeks    Status  On-going    Target Date  07/09/17      OT LONG TERM GOAL #7   Title  Pt. will independently demonstrate visual compensatory strategies duirng ADLs, and IADLs.    Baseline  Glaucoma    Time  12    Period  Weeks    Status  On-going    Target Date  07/09/17            Plan - 05/12/17 1716    Clinical Impression Statement  Pt. is now using his left hand more during ADL, and IADL tasks at home. Pt. presents with limited UE strength, and coordination skills. Pt. has difficulty grasping, and holding objects with his left hand with resistance. Pt. continues to work on  improving LUE strength, motor control, and coordination skills.      Occupational performance deficits (Please refer to evaluation for details):  ADL's;IADL's    Rehab Potential  Excellent    OT Frequency  2x / week    OT Duration  12 weeks    OT Treatment/Interventions  Self-care/ADL training;Therapeutic exercise;Neuromuscular education;Patient/family education;Energy conservation;Therapeutic activities;DME and/or AE instruction    Clinical Decision Making  Multiple treatment options, significant modification of task necessary    Consulted and Agree with Plan of Care  Patient       Patient will benefit from skilled therapeutic intervention in order to improve the following deficits and impairments:  Abnormal gait, Pain, Decreased strength, Decreased range of motion, Impaired UE functional use, Decreased coordination, Decreased knowledge of use of DME, Decreased activity tolerance, Difficulty walking, Decreased balance, Other (comment)  Visit Diagnosis: Muscle weakness (generalized)  Other lack of coordination    Problem List Patient Active Problem List   Diagnosis Date Noted  . Left hemiparesis (HCC)   . Essential hypertension 11/29/2016  . 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 Hepatitis C 07/22/2013    Olegario Messier, MS, OTR/L 05/12/2017, 5:26 PM  [facility] MAIN [facility] SERVICES [address] [place], [place], [address] Phone: [phone]   Fax:  [phone]  Name: Jason Nolan MRN: [id number] Date of Birth: January 24, 1946

## 2017-05-12 NOTE — Therapy (Signed)
[place] [facility] MAIN [facility] SERVICES [address] [place], [place], [address] Phone: [phone]   Fax:  [phone]  Physical Therapy Treatment  Patient Details  Name: Jason Nolan MRN: [id number] Date of Birth: 1946-01-21 Referring Provider: [name]   Encounter Date: 05/12/2017  PT End of Session - 05/12/17 1503    Visit Number  22    Number of Visits  46    Date for PT Re-Evaluation  06/16/17    PT Start Time  0150    PT Stop Time  0230    PT Time Calculation (min)  40 min    Equipment Utilized During Treatment  Gait belt    Activity Tolerance  Patient tolerated treatment well    Behavior During Therapy  [facility] for tasks assessed/performed       Past Medical History:  Diagnosis Date  . Glaucoma   . Hepatitis C   . Stroke [facility])     Past Surgical History:  Procedure Laterality Date  . IR ANGIO INTRA EXTRACRAN SEL COM CAROTID INNOMINATE UNI L MOD SED  11/25/2016  . IR ANGIO VERTEBRAL SEL SUBCLAVIAN INNOMINATE UNI R MOD SED  11/25/2016  . IR ANGIO VERTEBRAL SEL VERTEBRAL UNI L MOD SED  11/25/2016  . IR INTRAVSC STENT CERV CAROTID W/O EMB-PROT MOD SED INC ANGIO  11/25/2016  . IR PERCUTANEOUS ART THROMBECTOMY/INFUSION INTRACRANIAL INC DIAG ANGIO  11/25/2016  . IR RADIOLOGIST EVAL & MGMT  02/13/2017  . RADIOLOGY WITH ANESTHESIA N/A 11/25/2016   Procedure: RADIOLOGY WITH ANESTHESIA;  Surgeon: Radiologist, Medication, MD;  Location: [facility] OR;  Service: Radiology;  Laterality: N/A;    There were no vitals filed for this visit.  Subjective Assessment - 05/12/17 1502    Subjective  Patinet is doing fine today.No new concerns. Patient is doing his HEP; walking and doing his theraband exercises.    Pertinent History  Pt. is a 72 y.o. 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 Pt. was transferred to [facility]. He underwent CTA brain showed short segment near occlusion of proximal R-ICA with associated intraluminal  thrombus --likely due to dissection and emergent large vessel occlusion with right M2 occlusion and evolving right frontal lobe infarct.  He underwent cerebral angio with  complete revascularization of occluded superior division of R-MCA and near complete revascularization of proximal R-ICA with stent assisted angioplasty.  Stroke felt to be embolic in setting of ruptured plaque R-ICA and he was placed on ASA and brillinta due to stent. Pt. was transferred to inpatient rehab for several weeks, had home health therapy upon discharge.    Limitations  Walking;Lifting;House hold activities    How long can you sit comfortably?  unlimited     How long can you stand comfortably?  unlimited     How long can you walk comfortably?  unsure    Patient Stated Goals  "strengthen my whole L side"    Currently in Pain?  No/denies    Pain Score  0-No pain    Multiple Pain Sites  No       Treatment: Patient performs balance master exercises including: Ramdom limits of stability with random plate changes and surroundings at 40% for center 3 right, pin wheel right, pin wheel left with weight shifting for 2 mins each setting x 4 Random tracing with variable foot plate and variable surroundings at 40% and 50% grid from center of circle 2 mins each setting x 4  Patient  is able to perform all balance exercises and has no adverse reactions following treatment                       PT Education - 05/12/17 1503    Education provided  Yes    Education Details  Facilities manager) Educated  Patient    Methods  Explanation    Comprehension  Verbalized understanding       PT Short Term Goals - 04/23/17 1442      PT SHORT TERM GOAL #1   Title  Pt will improve 5x sit to stand to 18 seconds in order to demonstrate improved LE strength and improved dynamic balance.    Baseline  21 sec; 02/26/17: 16.1s    Time  6    Period  Weeks    Status  Achieved      PT SHORT TERM GOAL #2    Title  Pt will improve gait speed to at least 1.0 m/s with proper heel strike and decreased hip hike in order to demonstrate imrpoved LE strength and balance.    Baseline  0.83 m/s; 02/26/17: 1.11 m/s    Time  6    Period  Weeks    Status  Achieved      PT SHORT TERM GOAL #3   Title  Pt will improve Berg Balance score to 51/56 in order to demonstrate improved dynamic and static balance, decreasing overall falls risk.    Baseline  48/56; 02/26/17: 53/56    Time  6    Period  Weeks    Status  Achieved        PT Long Term Goals - 04/23/17 1434      PT LONG TERM GOAL #1   Title  Pt will demonstrate independence with HEP in order to continue LE strengthening and manage symptoms.    Time  12    Period  Weeks    Status  On-going    Target Date  06/16/17      PT LONG TERM GOAL #2   Title  Pt will improve 5x sit to stand to <15 seconds in order to demonstrate improved LE strength and dynamic balance.    Baseline  21 sec; 02/26/17: 16.1s 16.50 sec 04/23/17    Time  12    Period  Weeks    Status  On-going    Target Date  06/16/17      PT LONG TERM GOAL #3   Title  Pt will demonstrate gait speed of at least 1.2 m/s in order to demonstrate improved LE strength and improved balance in order to return to prior level of funciton.    Baseline  0.833 m/s 02/26/17: 1.11 m/s, 12/189/18 1.16 m/sec    Time  12    Period  Weeks    Status  On-going    Target Date  06/16/17      PT LONG TERM GOAL #4   Title  Pt will improve Berg Balance score to at least 54/56 in order demonstrate improved dynamic and static balance, decreasing overall falls risk.    Baseline  48/56; 02/26/17: 53/56; 04/23/17  53/56    Time  12    Period  Weeks    Status  On-going    Target Date  06/16/17      PT LONG TERM GOAL #5   Title  Pt will demonstrate improved LE strength to 4+/5 in all limited  planes in order to return to prior level of function.    Baseline  gross strength: 4/5; 02/26/17: deferred strength testing     Time  12    Period  Weeks    Status  On-going    Target Date  06/16/17      PT LONG TERM GOAL #6   Title  Pt will improve 6MWT to >1500 feet without using AD in order to return to prior level of function, demonstrating improvements in LE strength, balance and endurance.    Baseline  1205 ft without AD; 02/26/17: deferred to next session 04/23/17 1420 ft    Time  12    Period  Weeks    Status  On-going            Plan - 05/12/17 1505    Clinical Impression Statement  Patient performs rhythmic weight shift using the balance master machine and has difficulty with moderate and fast pace.He has difficulty with weight shift backwards and forward during reaching and has difficulty with regaining balance with surface changes. He has loss of balance with foot plate shifts and surroundings movement. Patient improved in outcome meausres and goals continue to improve. Patient will benefit from continued skilled PT to improve balance and strength deficits and improve quality of life    Rehab Potential  Good    Clinical Impairments Affecting Rehab Potential  high prior level of function    PT Frequency  2x / week    PT Duration  12 weeks    PT Treatment/Interventions  ADLs/Self Care Home Management;Cryotherapy;Moist Heat;Gait training;Stair training;Functional mobility training;Therapeutic activities;Therapeutic exercise;Balance training;Neuromuscular re-education;Patient/family education;Manual techniques    PT Next Visit Plan  6MWT, MMT, update those specific goals, continue strength/balance;    PT Home Exercise Plan  Add green theraband to HEP exercises    Consulted and Agree with Plan of Care  Patient       Patient will benefit from skilled therapeutic intervention in order to improve the following deficits and impairments:  Abnormal gait, Decreased balance, Decreased strength, Difficulty walking, Increased edema  Visit Diagnosis: Muscle weakness (generalized)  Other lack of  coordination  Unsteadiness on feet     Problem List Patient Active Problem List   Diagnosis Date Noted  . Left hemiparesis (HCC)   . Essential hypertension 11/29/2016  . 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 Hepatitis C 07/22/2013    Ezekiel InaMansfield, Joshalyn Ancheta S , PT DPT 05/12/2017, 3:08 PM  [place] [facility] [address] [place], [place], [address] Phone: [phone]   Fax:  [phone]  Name: Terrilee CroakJames Goodpasture MRN: [id number] Date of Birth: [birth year]

## 2017-05-13 DIAGNOSIS — H524 Presbyopia: Secondary | ICD-10-CM | POA: Diagnosis not present

## 2017-05-13 DIAGNOSIS — H401113 Primary open-angle glaucoma, right eye, severe stage: Secondary | ICD-10-CM | POA: Diagnosis not present

## 2017-05-13 DIAGNOSIS — H401122 Primary open-angle glaucoma, left eye, moderate stage: Secondary | ICD-10-CM | POA: Diagnosis not present

## 2017-05-13 DIAGNOSIS — H5203 Hypermetropia, bilateral: Secondary | ICD-10-CM | POA: Diagnosis not present

## 2017-05-13 DIAGNOSIS — H43813 Vitreous degeneration, bilateral: Secondary | ICD-10-CM | POA: Diagnosis not present

## 2017-05-13 DIAGNOSIS — H10413 Chronic giant papillary conjunctivitis, bilateral: Secondary | ICD-10-CM | POA: Diagnosis not present

## 2017-05-13 DIAGNOSIS — H25813 Combined forms of age-related cataract, bilateral: Secondary | ICD-10-CM | POA: Diagnosis not present

## 2017-05-14 ENCOUNTER — Encounter: Payer: Self-pay | Admitting: Physical Therapy

## 2017-05-14 ENCOUNTER — Encounter: Payer: Self-pay | Admitting: Occupational Therapy

## 2017-05-14 ENCOUNTER — Ambulatory Visit: Payer: Medicare Other | Admitting: Physical Therapy

## 2017-05-14 ENCOUNTER — Ambulatory Visit: Payer: Medicare Other | Admitting: Occupational Therapy

## 2017-05-14 DIAGNOSIS — R2681 Unsteadiness on feet: Secondary | ICD-10-CM

## 2017-05-14 DIAGNOSIS — M6281 Muscle weakness (generalized): Secondary | ICD-10-CM

## 2017-05-14 DIAGNOSIS — R278 Other lack of coordination: Secondary | ICD-10-CM

## 2017-05-14 NOTE — Therapy (Signed)
[place] [facility] MAIN [facility] SERVICES [address] [place], [place], [address] Phone: [phone]   Fax:  [phone]  Occupational Therapy Treatment  Patient Details  Name: Jason Nolan MRN: [id number] Date of Birth: 12/17/1945 Referring Provider (Historical): Dr. Wynn Banker   Encounter Date: 05/14/2017  OT End of Session - 05/14/17 1459    Visit Number  19    Number of Visits  24    Date for OT Re-Evaluation  07/09/17    OT Start Time  1345    OT Stop Time  1445    OT Time Calculation (min)  60 min    Activity Tolerance  Patient tolerated treatment well    Behavior During Therapy  [facility] for tasks assessed/performed       Past Medical History:  Diagnosis Date  . Glaucoma   . Hepatitis C   . Stroke [facility])     Past Surgical History:  Procedure Laterality Date  . IR ANGIO INTRA EXTRACRAN SEL COM CAROTID INNOMINATE UNI L MOD SED  11/25/2016  . IR ANGIO VERTEBRAL SEL SUBCLAVIAN INNOMINATE UNI R MOD SED  11/25/2016  . IR ANGIO VERTEBRAL SEL VERTEBRAL UNI L MOD SED  11/25/2016  . IR INTRAVSC STENT CERV CAROTID W/O EMB-PROT MOD SED INC ANGIO  11/25/2016  . IR PERCUTANEOUS ART THROMBECTOMY/INFUSION INTRACRANIAL INC DIAG ANGIO  11/25/2016  . IR RADIOLOGIST EVAL & MGMT  02/13/2017  . RADIOLOGY WITH ANESTHESIA N/A 11/25/2016   Procedure: RADIOLOGY WITH ANESTHESIA;  Surgeon: Radiologist, Medication, MD;  Location: [facility] OR;  Service: Radiology;  Laterality: N/A;    There were no vitals filed for this visit.  Subjective Assessment - 05/14/17 1456    Subjective   pt. reports he had an eye appointment yesterday, and that his vision has not changed much since prior to the stroke.    Patient is accompained by:  Family member    Pertinent History  Pt. is a 72 y.o. 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 Pt. was transferred to [facility]. He underwent CTA brain showed short segment near occlusion of proximal R-ICA with associated intraluminal thrombus  --likely due to dissection and emergent large vessel occlusion with right M2 occlusion and evolving right frontal lobe infarct.  He underwent cerebral angio with  complete revascularization of occluded superior division of R-MCA and near complete revascularization of proximal R-ICA with stent assisted angioplasty.  Stroke felt to be embolic in setting of ruptured plaque R-ICA and he was placed on ASA and brillinta due to stent. Pt. was transferred to inpatient rehab for several weeks, had home health therapy upon discharge, and now is ready for outpatient therapy services.    Currently in Pain?  No/denies      OT TREATMENT    Neuro muscular re-education:  Pt. worked on grasping 1/2" circular objects, and 1" flat objects. Pt. worked on grasping them with isolated 2nd digit, and storing them in the palm of his hand. Pt. Worked on stacking the flat objects.  Therapeutic Exercise:  Pt. Worked on the [employer] for 8 min. With constant monitoring of the BUEs. Pt. Worked on changing, and alternating forward reverse position every 2 min. Rest breaks were required. Pt. performed gross gripping with grip strengthener. Pt. worked on sustaining grip while grasping pegs and reaching at various heights. Gripper was placed in the 3rd, 4th resistive slot with the white resistive spring. Pt. Worked on pinch strengthening in the left hand for lateral, and 3pt. pinch  using yellow, and red resistive clips. Pt. worked on placing the clips at various vertical and horizontal angles. Tactile and verbal cues were required for eliciting the desired movement.                          OT Education - 05/14/17 1458    Education provided  Yes    Education Details  UE strength, and [facility]    Person(s) Educated  Patient    Methods  Explanation;Verbal cues    Comprehension  Verbalized understanding          OT Long Term Goals - 04/16/17 1633      OT LONG TERM GOAL #1   Title  Pt. will improve edema by 2  cm through MPs in preparation for ROM.    Baseline  improving    Time  12    Period  Weeks    Status  On-going    Target Date  07/09/17      OT LONG TERM GOAL #2   Title  Pt. will improve Left hand digit extension in preparation for weightbearing, and releasing objects during ADLs/IADLs.    Baseline  Pt. is unable to achieve full digit extension on the left.    Time  12    Period  Weeks    Status  On-going    Target Date  07/09/17      OT LONG TERM GOAL #3   Title  Pt. left hand grip with  increase by 5# in preparation for holding a cup.    Baseline  Left 2#, RIght: 60    Time  12    Period  Weeks    Status  On-going    Target Date  07/09/17      OT LONG TERM GOAL #4   Title  Pt. will increased right shoulder strength by 1 mm grade to assist with ADLs.    Baseline  Impaired LUE strength    Time  12    Period  Weeks    Target Date  07/09/17      OT LONG TERM GOAL #5   Title  Pt. will increase left wrist extension in preparation for reaching ADL items.    Baseline  wrist extension 28 degrees    Time  12    Period  Weeks    Status  On-going    Target Date  07/09/17      OT LONG TERM GOAL #6   Title  Pt. will improve left hand coordination skills to be able to button clothing.    Baseline  Pt. conitnues to have difficulty    Time  12    Period  Weeks    Status  On-going    Target Date  07/09/17      OT LONG TERM GOAL #7   Title  Pt. will independently demonstrate visual compensatory strategies duirng ADLs, and IADLs.    Baseline  Glaucoma    Time  12    Period  Weeks    Status  On-going    Target Date  07/09/17            Plan - 05/14/17 1459    Clinical Impression Statement  Pt. reports he is trying to engage his right hand during ADL, and IADL tasks at home. Pt. is progressing and starting to initiate isolated 2nd digit in preparation for grasping. Pt. continues to work on improving LUE strength,  and coordination skills. for improved engagement in ADLs, and  IADL tasks.    Occupational performance deficits (Please refer to evaluation for details):  ADL's;IADL's    Rehab Potential  Excellent    OT Frequency  2x / week    OT Duration  12 weeks    OT Treatment/Interventions  Self-care/ADL training;Therapeutic exercise;Neuromuscular education;Patient/family education;Energy conservation;Therapeutic activities;DME and/or AE instruction    Clinical Decision Making  Multiple treatment options, significant modification of task necessary    Consulted and Agree with Plan of Care  Patient       Patient will benefit from skilled therapeutic intervention in order to improve the following deficits and impairments:  Abnormal gait, Pain, Decreased strength, Decreased range of motion, Impaired UE functional use, Decreased coordination, Decreased knowledge of use of DME, Decreased activity tolerance, Difficulty walking, Decreased balance, Other (comment)  Visit Diagnosis: Muscle weakness (generalized)  Other lack of coordination    Problem List Patient Active Problem List   Diagnosis Date Noted  . Left hemiparesis (HCC)   . Essential hypertension 11/29/2016  . 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 Hepatitis C 07/22/2013    Olegario Messier, MS, OTR/L 05/14/2017, 3:08 PM  [place] [facility] MAIN [facility] SERVICES [address] [place], [place], [address] Phone: [phone]   Fax:  [phone]  Name: Arvle Grabe MRN: [id number] Date of Birth: [birth year]

## 2017-05-14 NOTE — Therapy (Signed)
[place] [facility] MAIN [facility] SERVICES [address] [place], [place], [address] Phone: [phone]   Fax:  [phone]  Physical Therapy Treatment  Patient Details  Name: Nature Vogelsang MRN: [id number] Date of Birth: 29-Dec-1945 Referring Provider: [name]   Encounter Date: 05/14/2017  PT End of Session - 05/14/17 1450    Visit Number  23    Number of Visits  46    Date for PT Re-Evaluation  06/16/17    PT Start Time  0245    PT Stop Time  0330    PT Time Calculation (min)  45 min    Equipment Utilized During Treatment  Gait belt    Activity Tolerance  Patient tolerated treatment well    Behavior During Therapy  [facility] for tasks assessed/performed       Past Medical History:  Diagnosis Date  . Glaucoma   . Hepatitis C   . Stroke [facility])     Past Surgical History:  Procedure Laterality Date  . IR ANGIO INTRA EXTRACRAN SEL COM CAROTID INNOMINATE UNI L MOD SED  11/25/2016  . IR ANGIO VERTEBRAL SEL SUBCLAVIAN INNOMINATE UNI R MOD SED  11/25/2016  . IR ANGIO VERTEBRAL SEL VERTEBRAL UNI L MOD SED  11/25/2016  . IR INTRAVSC STENT CERV CAROTID W/O EMB-PROT MOD SED INC ANGIO  11/25/2016  . IR PERCUTANEOUS ART THROMBECTOMY/INFUSION INTRACRANIAL INC DIAG ANGIO  11/25/2016  . IR RADIOLOGIST EVAL & MGMT  02/13/2017  . RADIOLOGY WITH ANESTHESIA N/A 11/25/2016   Procedure: RADIOLOGY WITH ANESTHESIA;  Surgeon: Radiologist, Medication, MD;  Location: [facility] OR;  Service: Radiology;  Laterality: N/A;    There were no vitals filed for this visit.  Subjective Assessment - 05/14/17 1449    Subjective  Patinet is doing fine today.No new concerns. Patient is doing his HEP; walking and doing his theraband exercises.    Pertinent History  Pt. is a 72 y.o. 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 Pt. was transferred to [facility]. He underwent CTA brain showed short segment near occlusion of proximal R-ICA with associated intraluminal  thrombus --likely due to dissection and emergent large vessel occlusion with right M2 occlusion and evolving right frontal lobe infarct.  He underwent cerebral angio with  complete revascularization of occluded superior division of R-MCA and near complete revascularization of proximal R-ICA with stent assisted angioplasty.  Stroke felt to be embolic in setting of ruptured plaque R-ICA and he was placed on ASA and brillinta due to stent. Pt. was transferred to inpatient rehab for several weeks, had home health therapy upon discharge.    Limitations  Walking;Lifting;House hold activities    How long can you sit comfortably?  unlimited     How long can you stand comfortably?  unlimited     How long can you walk comfortably?  unsure    Patient Stated Goals  "strengthen my whole L side"    Currently in Pain?  No/denies    Pain Score  0-No pain    Multiple Pain Sites  No       Treatment: Plank on feet prone x 1 min Tall kneeling with yellow ball and diagonal chops x 10 Quadriped alternating opposite UE and LE with 5 sec hold x 10 reps Squat with GTB around knees and side step , alternating with BUE theraball overhead, and another squat with BTB x 10 reps Matrix with 17. 5 lbs over red mat with stepping stones under  the mat, followed by a step up to 6 inch stool and step down to purple foam x 5 fwd, x 5 bwd Standing tandem flat side up on 1/2 foam with OTB trunk rotation x 15 Patient needs CGA with all balance tasks and cues for posture and technique. He is fatigued after treatment and has no pain complaints. He is able to progress his HEP to strengthen his core.                         PT Education - 05/14/17 1449    Education provided  Yes    Education Details  strengthening and balance     Person(s) Educated  Patient    Methods  Explanation;Tactile cues;Verbal cues    Comprehension  Verbalized understanding       PT Short Term Goals - 04/23/17 1442      PT SHORT  TERM GOAL #1   Title  Pt will improve 5x sit to stand to 18 seconds in order to demonstrate improved LE strength and improved dynamic balance.    Baseline  21 sec; 02/26/17: 16.1s    Time  6    Period  Weeks    Status  Achieved      PT SHORT TERM GOAL #2   Title  Pt will improve gait speed to at least 1.0 m/s with proper heel strike and decreased hip hike in order to demonstrate imrpoved LE strength and balance.    Baseline  0.83 m/s; 02/26/17: 1.11 m/s    Time  6    Period  Weeks    Status  Achieved      PT SHORT TERM GOAL #3   Title  Pt will improve Berg Balance score to 51/56 in order to demonstrate improved dynamic and static balance, decreasing overall falls risk.    Baseline  48/56; 02/26/17: 53/56    Time  6    Period  Weeks    Status  Achieved        PT Long Term Goals - 04/23/17 1434      PT LONG TERM GOAL #1   Title  Pt will demonstrate independence with HEP in order to continue LE strengthening and manage symptoms.    Time  12    Period  Weeks    Status  On-going    Target Date  06/16/17      PT LONG TERM GOAL #2   Title  Pt will improve 5x sit to stand to <15 seconds in order to demonstrate improved LE strength and dynamic balance.    Baseline  21 sec; 02/26/17: 16.1s 16.50 sec 04/23/17    Time  12    Period  Weeks    Status  On-going    Target Date  06/16/17      PT LONG TERM GOAL #3   Title  Pt will demonstrate gait speed of at least 1.2 m/s in order to demonstrate improved LE strength and improved balance in order to return to prior level of funciton.    Baseline  0.833 m/s 02/26/17: 1.11 m/s, 12/189/18 1.16 m/sec    Time  12    Period  Weeks    Status  On-going    Target Date  06/16/17      PT LONG TERM GOAL #4   Title  Pt will improve Berg Balance score to at least 54/56 in order demonstrate improved dynamic and static balance, decreasing overall falls  risk.    Baseline  48/56; 02/26/17: 53/56; 04/23/17  53/56    Time  12    Period  Weeks     Status  On-going    Target Date  06/16/17      PT LONG TERM GOAL #5   Title  Pt will demonstrate improved LE strength to 4+/5 in all limited planes in order to return to prior level of function.    Baseline  gross strength: 4/5; 02/26/17: deferred strength testing    Time  12    Period  Weeks    Status  On-going    Target Date  06/16/17      PT LONG TERM GOAL #6   Title  Pt will improve to >1500 feet without using AD in order to return to prior level of function, demonstrating improvements in LE strength, balance and endurance.    Baseline  1205 ft without AD; 02/26/17: deferred to next session 04/23/17 1420 ft    Time  12    Period  Weeks    Status  On-going            Plan - 05/14/17 1539    Clinical Impression Statement  Patient is progressed wiht HEP in closed chain exercises in quadriped and tall kneeling to 1/2 kneeling positions. He is able to tolerate plank x 1 min and challenges wiht dynamic standing activities to improve his mobility and stability. He will continue to benefit from skilled PT to improve left ankle strength and core strength and dynamic standing balance.     Rehab Potential  Good    Clinical Impairments Affecting Rehab Potential  high prior level of function    PT Frequency  2x / week    PT Duration  12 weeks    PT Treatment/Interventions  ADLs/Self Care Home Management;Cryotherapy;Moist Heat;Gait training;Stair training;Functional mobility training;Therapeutic activities;Therapeutic exercise;Balance training;Neuromuscular re-education;Patient/family education;Manual techniques    PT Next Visit Plan  , MMT, update those specific goals, continue strength/balance;    PT Home Exercise Plan  Add green theraband to HEP exercises    Consulted and Agree with Plan of Care  Patient       Patient will benefit from skilled therapeutic intervention in order to improve the following deficits and impairments:  Abnormal gait, Decreased balance, Decreased  strength, Difficulty walking, Increased edema  Visit Diagnosis: Muscle weakness (generalized)  Other lack of coordination  Unsteadiness on feet     Problem List Patient Active Problem List   Diagnosis Date Noted  . Left hemiparesis (HCC)   . Essential hypertension 11/29/2016  . 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 Hepatitis C 07/22/2013    Ezekiel Ina, [place] DPT 05/14/2017, 3:43 PM  [place] [facility] MAIN [facility] SERVICES [address] [place], [place], [address] Phone: [phone]   Fax:  [phone]  Name: Montana Fassnacht MRN: [id number] Date of Birth: [birth year]

## 2017-05-15 ENCOUNTER — Encounter: Payer: Self-pay | Admitting: Physical Medicine & Rehabilitation

## 2017-05-15 ENCOUNTER — Telehealth: Payer: Self-pay | Admitting: *Deleted

## 2017-05-15 NOTE — Telephone Encounter (Signed)
[name] left a message stating that [employer] is requiring a written letter from his physician to authorize a message.  They are reluctant to perform messages without written permission from a provider. He states he had this conversation with Dr. Wynn BankerKirsteins at his last visit. He is hoping to pick this letter up tomorrow 05/16/2017.

## 2017-05-19 ENCOUNTER — Encounter: Payer: Self-pay | Admitting: Physical Therapy

## 2017-05-19 ENCOUNTER — Encounter: Payer: Self-pay | Admitting: Occupational Therapy

## 2017-05-19 ENCOUNTER — Ambulatory Visit: Payer: Medicare Other | Admitting: Occupational Therapy

## 2017-05-19 ENCOUNTER — Ambulatory Visit: Payer: Medicare Other | Admitting: Physical Therapy

## 2017-05-19 DIAGNOSIS — R2681 Unsteadiness on feet: Secondary | ICD-10-CM

## 2017-05-19 DIAGNOSIS — R278 Other lack of coordination: Secondary | ICD-10-CM

## 2017-05-19 DIAGNOSIS — M6281 Muscle weakness (generalized): Secondary | ICD-10-CM

## 2017-05-19 NOTE — Therapy (Signed)
[place] [facility]IN [facility] [address] [place], [place], [address] Phone: [phone]   Fax:  [phone]  Physical Therapy Treatment  Patient Details  Name: Jason CroakJames Nolan MRN: [id number] Date of Birth: 10/09/1945 Referring Provider: Lorre MunroeBAITY, REGINA W   Encounter Date: 05/19/2017  PT End of Session - 05/19/17 1353    Visit Number  24    Number of Visits  46    Date for PT Re-Evaluation  06/16/17    PT Start Time  0145    PT Stop Time  0230    PT Time Calculation (min)  45 min    Equipment Utilized During Treatment  Gait belt    Activity Tolerance  Patient tolerated treatment well    Behavior During Therapy  [facility] for tasks assessed/performed       Past Medical History:  Diagnosis Date  . Glaucoma   . Hepatitis C   . Stroke [facility](HCC)     Past Surgical History:  Procedure Laterality Date  . IR ANGIO INTRA EXTRACRAN SEL COM CAROTID INNOMINATE UNI L MOD SED  11/25/2016  . IR ANGIO VERTEBRAL SEL SUBCLAVIAN INNOMINATE UNI R MOD SED  11/25/2016  . IR ANGIO VERTEBRAL SEL VERTEBRAL UNI L MOD SED  11/25/2016  . IR INTRAVSC STENT CERV CAROTID W/O EMB-PROT MOD SED INC ANGIO  11/25/2016  . IR PERCUTANEOUS ART THROMBECTOMY/INFUSION INTRACRANIAL INC DIAG ANGIO  11/25/2016  . IR RADIOLOGIST EVAL & MGMT  02/13/2017  . RADIOLOGY WITH ANESTHESIA N/A 11/25/2016   Procedure: RADIOLOGY WITH ANESTHESIA;  Surgeon: Radiologist, Medication, MD;  Location: [facility] OR;  Service: Radiology;  Laterality: N/A;    There were no vitals filed for this visit.  Subjective Assessment - 05/19/17 1352    Subjective  Patinet is doing fine today.No new concerns. Patient is doing his HEP; walking and doing his theraband exercises.    Pertinent History  Pt. is a 72 y.o. 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 [facility] He underwent CTA brain showed short segment near occlusion of proximal R-ICA with associated intraluminal  thrombus --likely due to dissection and emergent large vessel occlusion with right M2 occlusion and evolving right frontal lobe infarct.  He underwent cerebral angio with  complete revascularization of occluded superior division of R-MCA and near complete revascularization of proximal R-ICA with stent assisted angioplasty.  Stroke felt to be embolic in setting of ruptured plaque R-ICA and he was placed on ASA and brillinta due to stent. Pt. was transferred to inpatient rehab for several weeks, had home health therapy upon discharge.    Limitations  Walking;Lifting;House hold activities    How long can you sit comfortably?  unlimited     How long can you stand comfortably?  unlimited     How long can you walk comfortably?  unsure    Patient Stated Goals  "strengthen my whole L side"    Currently in Pain?  No/denies    Pain Score  0-No pain    Multiple Pain Sites  No       Treatment:  Matrix 17. 5 lbs x 5 reps fwd/bwd/side to side step up over 6 inch stool and down to foam   Standing on blue disk forward tandem with ball UE opposite x 10 shifting weight then change feet x 10   Standing on blue disk side to side  with ball UE opposite x 10 shifting weight then change feet x  10   Rocker board fwd/bwd with ball UE opposite side x 10 and then switch feet x 10  CGA and Min to mod verbal cues used throughout with increased in postural sway and LOB most seen with narrow base of support and while on uneven surfaces. Continues to have balance deficits typical with diagnosis. Patient performs advanced level exercises without pain behaviors and needs verbal cuing for postural alignment and head positioning                                PT Education - 05/19/17 1353    Education provided  Yes    Education Details  HEP     Person(s) Educated  Patient    Methods  Explanation    Comprehension  Verbalized understanding       PT Short Term Goals - 04/23/17 1442      PT  SHORT TERM GOAL #1   Title  Pt will improve 5x sit to stand to 18 seconds in order to demonstrate improved LE strength and improved dynamic balance.    Baseline  21 sec; 02/26/17: 16.1s    Time  6    Period  Weeks    Status  Achieved      PT SHORT TERM GOAL #2   Title  Pt will improve gait speed to at least 1.0 m/s with proper heel strike and decreased hip hike in order to demonstrate imrpoved LE strength and balance.    Baseline  0.83 m/s; 02/26/17: 1.11 m/s    Time  6    Period  Weeks    Status  Achieved      PT SHORT TERM GOAL #3   Title  Pt will improve Berg Balance score to 51/56 in order to demonstrate improved dynamic and static balance, decreasing overall falls risk.    Baseline  48/56; 02/26/17: 53/56    Time  6    Period  Weeks    Status  Achieved        PT Long Term Goals - 04/23/17 1434      PT LONG TERM GOAL #1   Title  Pt will demonstrate independence with HEP in order to continue LE strengthening and manage symptoms.    Time  12    Period  Weeks    Status  On-going    Target Date  06/16/17      PT LONG TERM GOAL #2   Title  Pt will improve 5x sit to stand to <15 seconds in order to demonstrate improved LE strength and dynamic balance.    Baseline  21 sec; 02/26/17: 16.1s 16.50 sec 04/23/17    Time  12    Period  Weeks    Status  On-going    Target Date  06/16/17      PT LONG TERM GOAL #3   Title  Pt will demonstrate gait speed of at least 1.2 m/s in order to demonstrate improved LE strength and improved balance in order to return to prior level of funciton.    Baseline  0.833 m/s 02/26/17: 1.11 m/s, 12/189/18 1.16 m/sec    Time  12    Period  Weeks    Status  On-going    Target Date  06/16/17      PT LONG TERM GOAL #4   Title  Pt will improve Berg Balance score to at least 54/56 in order demonstrate improved dynamic  and static balance, decreasing overall falls risk.    Baseline  48/56; 02/26/17: 53/56; 04/23/17  53/56    Time  12    Period  Weeks     Status  On-going    Target Date  06/16/17      PT LONG TERM GOAL #5   Title  Pt will demonstrate improved LE strength to 4+/5 in all limited planes in order to return to prior level of function.    Baseline  gross strength: 4/5; 02/26/17: deferred strength testing    Time  12    Period  Weeks    Status  On-going    Target Date  06/16/17      PT LONG TERM GOAL #6   Title  Pt will improve to >1500 feet without using AD in order to return to prior level of function, demonstrating improvements in LE strength, balance and endurance.    Baseline  1205 ft without AD; 02/26/17: deferred to next session 04/23/17 1420 ft    Time  12    Period  Weeks    Status  On-going            Plan - 05/19/17 1523    Clinical Impression Statement  Patient required min verbal cueing during matrix machine stepping, and required CGA during all dynamic standing balance activities. Patient required occasional rest breaks between exercises due to fatigue. Patient tolerated exercise well. Patient will continue to benefit from skilled therapy in order to improve dynamic standing balance activities to reduce risk for falls    Rehab Potential  Good    Clinical Impairments Affecting Rehab Potential  high prior level of function    PT Frequency  2x / week    PT Duration  12 weeks    PT Treatment/Interventions  ADLs/Self Care Home Management;Cryotherapy;Moist Heat;Gait training;Stair training;Functional mobility training;Therapeutic activities;Therapeutic exercise;Balance training;Neuromuscular re-education;Patient/family education;Manual techniques    PT Next Visit Plan  , MMT, update those specific goals, continue strength/balance;    PT Home Exercise Plan  Add green theraband to HEP exercises    Consulted and Agree with Plan of Care  Patient       Patient will benefit from skilled therapeutic intervention in order to improve the following deficits and impairments:  Abnormal gait, Decreased balance,  Decreased strength, Difficulty walking, Increased edema  Visit Diagnosis: Muscle weakness (generalized)  Other lack of coordination  Unsteadiness on feet     Problem List Patient Active Problem List   Diagnosis Date Noted  . Left hemiparesis (HCC)   . Essential hypertension 11/29/2016  . 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 Hepatitis C 07/22/2013    Ezekiel Ina, [place] DPT 05/19/2017, 3:29 PM  [place] [facility] MAIN [facility] SERVICES [address] [place], [place], [address] Phone: [phone]   Fax:  [phone]  Name: Jason Nolan MRN: [id number] Date of Birth: 11/29/1945

## 2017-05-19 NOTE — Therapy (Signed)
[place] [facility] MAIN [facility] SERVICES [address] [place], [place], [address] Phone: [phone]   Fax:  [phone]  Occupational Therapy Treatment  Patient Details  Name: Jason Nolan MRN: [id number] Date of Birth: 1945/05/13 Referring Provider: [name]   Encounter Date: 05/19/2017  OT End of Session - 05/19/17 1451    Visit Number  20    Number of Visits  24    Date for OT Re-Evaluation  07/09/17    OT Start Time  1436    OT Stop Time  1515    OT Time Calculation (min)  39 min    Activity Tolerance  Patient tolerated treatment well    Behavior During Therapy  [facility] for tasks assessed/performed       Past Medical History:  Diagnosis Date  . Glaucoma   . Hepatitis C   . Stroke [facility])     Past Surgical History:  Procedure Laterality Date  . IR ANGIO INTRA EXTRACRAN SEL COM CAROTID INNOMINATE UNI L MOD SED  11/25/2016  . IR ANGIO VERTEBRAL SEL SUBCLAVIAN INNOMINATE UNI R MOD SED  11/25/2016  . IR ANGIO VERTEBRAL SEL VERTEBRAL UNI L MOD SED  11/25/2016  . IR INTRAVSC STENT CERV CAROTID W/O EMB-PROT MOD SED INC ANGIO  11/25/2016  . IR PERCUTANEOUS ART THROMBECTOMY/INFUSION INTRACRANIAL INC DIAG ANGIO  11/25/2016  . IR RADIOLOGIST EVAL & MGMT  02/13/2017  . RADIOLOGY WITH ANESTHESIA N/A 11/25/2016   Procedure: RADIOLOGY WITH ANESTHESIA;  Surgeon: Radiologist, Medication, MD;  Location: [facility] OR;  Service: Radiology;  Laterality: N/A;    There were no vitals filed for this visit.  Subjective Assessment - 05/19/17 1446    Subjective   Pt. reports he is able to reach to his right shoulder with his left hand.    Patient is accompained by:  Family member    Pertinent History  Pt. is a 72 y.o. 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 Pt. was transferred to [facility]. He underwent CTA brain showed short segment near occlusion of proximal R-ICA with associated intraluminal thrombus --likely due to dissection and emergent large vessel  occlusion with right M2 occlusion and evolving right frontal lobe infarct.  He underwent cerebral angio with  complete revascularization of occluded superior division of R-MCA and near complete revascularization of proximal R-ICA with stent assisted angioplasty.  Stroke felt to be embolic in setting of ruptured plaque R-ICA and he was placed on ASA and brillinta due to stent. Pt. was transferred to inpatient rehab for several weeks, had home health therapy upon discharge, and now is ready for outpatient therapy services.    Currently in Pain?  No/denies      OT TREATMENT    Neuro muscular re-education:  Pt. Worked on grasping one inch resistive cubes alternating thumb opposition to the tip of the 2nd through 5th digits. The board was positioned at a vertical angle. Pt. Worked on pressing them back into place while isolating 2nd.  Therapeutic Exercise:  Pt. Worked on the [employer] for 8 min. With constant monitoring of the BUEs. Pt. Worked on changing, and alternating forward reverse position every 2 min. Rest breaks were required. Pt. performed gross gripping with grip strengthener. Pt. worked on sustaining grip while grasping pegs and reaching at various heights. Gripper was placed in the  4th resistive slot with the white resistive spring. Pt. Worked on pinch strengthening in the left hand for lateral, and 3pt. pinch using yellow, red,  and green resistive clips. Pt. worked on placing the clips at various vertical and horizontal angles. Tactile and verbal cues were required for eliciting the desired movement.                          OT Education - 05/19/17 1450    Education provided  Yes    Education Details  HEP, coordination    Person(s) Educated  Patient    Methods  Explanation    Comprehension  Verbalized understanding          OT Long Term Goals - 04/16/17 1633      OT LONG TERM GOAL #1   Title  Pt. will improve edema by 2 cm through MPs in preparation for  ROM.    Baseline  improving    Time  12    Period  Weeks    Status  On-going    Target Date  07/09/17      OT LONG TERM GOAL #2   Title  Pt. will improve Left hand digit extension in preparation for weightbearing, and releasing objects during ADLs/IADLs.    Baseline  Pt. is unable to achieve full digit extension on the left.    Time  12    Period  Weeks    Status  On-going    Target Date  07/09/17      OT LONG TERM GOAL #3   Title  Pt. left hand grip with  increase by 5# in preparation for holding a cup.    Baseline  Left 2#, RIght: 60    Time  12    Period  Weeks    Status  On-going    Target Date  07/09/17      OT LONG TERM GOAL #4   Title  Pt. will increased right shoulder strength by 1 mm grade to assist with ADLs.    Baseline  Impaired LUE strength    Time  12    Period  Weeks    Target Date  07/09/17      OT LONG TERM GOAL #5   Title  Pt. will increase left wrist extension in preparation for reaching ADL items.    Baseline  wrist extension 28 degrees    Time  12    Period  Weeks    Status  On-going    Target Date  07/09/17      OT LONG TERM GOAL #6   Title  Pt. will improve left hand coordination skills to be able to button clothing.    Baseline  Pt. conitnues to have difficulty    Time  12    Period  Weeks    Status  On-going    Target Date  07/09/17      OT LONG TERM GOAL #7   Title  Pt. will independently demonstrate visual compensatory strategies duirng ADLs, and IADLs.    Baseline  Glaucoma    Time  12    Period  Weeks    Status  On-going    Target Date  07/09/17            Plan - 05/19/17 1451    Clinical Impression Statement  Pt. reports he is now using his left UE to reach his right shoulder. Pt. continues to work on improving UE strength, and coordination skills with the LUE and hand. pt. requires verbal cues, and visual demonstration for isolating his 2nd digit during tasks.  Occupational performance deficits (Please refer to  evaluation for details):  ADL's;IADL's    Rehab Potential  Excellent    OT Frequency  2x / week    OT Duration  12 weeks    OT Treatment/Interventions  Self-care/ADL training;Therapeutic exercise;Neuromuscular education;Patient/family education;Energy conservation;Therapeutic activities;DME and/or AE instruction    Clinical Decision Making  Multiple treatment options, significant modification of task necessary    Consulted and Agree with Plan of Care  Patient       Patient will benefit from skilled therapeutic intervention in order to improve the following deficits and impairments:  Abnormal gait, Pain, Decreased strength, Decreased range of motion, Impaired UE functional use, Decreased coordination, Decreased knowledge of use of DME, Decreased activity tolerance, Difficulty walking, Decreased balance, Other (comment)  Visit Diagnosis: Muscle weakness (generalized)  Other lack of coordination    Problem List Patient Active Problem List   Diagnosis Date Noted  . Left hemiparesis (HCC)   . Essential hypertension 11/29/2016  . 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 Hepatitis C 07/22/2013    Olegario Messier, MS, OTR/L 05/19/2017, 3:15 PM  [place] [facility] MAIN [facility] SERVICES [address] [place], [place], [address] Phone: [phone]   Fax:  [phone]  Name: Diago Haik MRN: [id number] Date of Birth: 27-Dec-1945

## 2017-05-20 ENCOUNTER — Ambulatory Visit (HOSPITAL_BASED_OUTPATIENT_CLINIC_OR_DEPARTMENT_OTHER): Payer: Medicare Other | Admitting: Physical Medicine & Rehabilitation

## 2017-05-20 ENCOUNTER — Encounter: Payer: Self-pay | Admitting: Physical Medicine & Rehabilitation

## 2017-05-20 ENCOUNTER — Encounter: Payer: Medicare Other | Attending: Physical Medicine & Rehabilitation

## 2017-05-20 VITALS — BP 150/72 | HR 64

## 2017-05-20 DIAGNOSIS — R600 Localized edema: Secondary | ICD-10-CM | POA: Diagnosis not present

## 2017-05-20 DIAGNOSIS — I69352 Hemiplegia and hemiparesis following cerebral infarction affecting left dominant side: Secondary | ICD-10-CM | POA: Diagnosis not present

## 2017-05-20 DIAGNOSIS — G8194 Hemiplegia, unspecified affecting left nondominant side: Secondary | ICD-10-CM | POA: Diagnosis not present

## 2017-05-20 DIAGNOSIS — H409 Unspecified glaucoma: Secondary | ICD-10-CM | POA: Insufficient documentation

## 2017-05-20 DIAGNOSIS — Z87891 Personal history of nicotine dependence: Secondary | ICD-10-CM | POA: Diagnosis not present

## 2017-05-20 DIAGNOSIS — H401112 Primary open-angle glaucoma, right eye, moderate stage: Secondary | ICD-10-CM | POA: Diagnosis not present

## 2017-05-20 NOTE — Progress Notes (Signed)
Subjective:    Patient ID: Jason Nolan, male    DOB: 07/03/1945, [age] y.o.   MRN: [id number] 72 y.o.left handed male with history of Hep C-treated with Harvoni, glaucoma who was admitted via [facility] on 7/23 with left sided weakness, facial droop and slurred speech due to early changes of R-MCA territory infarct. He underwent CTA brain showed short segment near occlusion of proximal R-ICA with associated intraluminal thrombus --likely due to dissection and emergent large vessel occlusion with right M2 occlusion and evolving right frontal lobe infarct. He underwent cerebral angio with complete revascularization of occluded superior division of R-MCA and near complete revascularization of proximal R-ICA with stent assisted angioplasty. Stroke felt to be embolic in setting of ruptured plaque R-ICA and he was placedon ASA and brillinta due to stent. Patient with resultant left sided weakness, expressive deficits with aphonia and dysphagia HPI Receiving outpt PT and OT Left hand still feels weak cannot pick up a gallon container Left foot and ankle are ok No issues with dressing and bathing Now able to tie shoe. Sees Dr Corliss Skainseveshwar Starting to get headaches again, no new meds Seen by optho today. Seen by optho, treated for glaucoma with elevated pressure in R eye Driving without issues   Pain Inventory Average Pain 0 Pain Right Now 0 My pain is na  In the last 24 hours, has pain interfered with the following? General activity 0 Relation with others 0 Enjoyment of life 0 What TIME of day is your pain at its worst? na Sleep (in general) na  Pain is worse with: na Pain improves with: na Relief from Meds: na  Mobility walk without assistance ability to climb steps?  yes do you drive?  yes  Function retired  Neuro/Psych No problems in this area  Prior Studies Any changes since last visit?  no  Physicians involved in your care Any changes since last visit?  no   Family  History  Problem Relation Age of Onset  . Hypertension Mother   . Hypertension Father    Social History   Socioeconomic History  . Marital status: Married    Spouse name: Not on file  . Number of children: Not on file  . Years of education: Not on file  . Highest education level: Not on file  Social Needs  . Financial resource strain: Not on file  . Food insecurity - worry: Not on file  . Food insecurity - inability: Not on file  . Transportation needs - medical: Not on file  . Transportation needs - non-medical: Not on file  Occupational History  . Not on file  Tobacco Use  . Smoking status: Former Games developermoker  . Smokeless tobacco: Never Used  Substance and Sexual Activity  . Alcohol use: No    Alcohol/week: 0.0 oz  . Drug use: No  . Sexual activity: Yes    Partners: Female    Birth control/protection: None  Other Topics Concern  . Not on file  Social History Narrative  . Not on file   Past Surgical History:  Procedure Laterality Date  . IR ANGIO INTRA EXTRACRAN SEL COM CAROTID INNOMINATE UNI L MOD SED  11/25/2016  . IR ANGIO VERTEBRAL SEL SUBCLAVIAN INNOMINATE UNI R MOD SED  11/25/2016  . IR ANGIO VERTEBRAL SEL VERTEBRAL UNI L MOD SED  11/25/2016  . IR INTRAVSC STENT CERV CAROTID W/O EMB-PROT MOD SED INC ANGIO  11/25/2016  . IR PERCUTANEOUS ART THROMBECTOMY/INFUSION INTRACRANIAL INC DIAG ANGIO  11/25/2016  .  IR RADIOLOGIST EVAL & MGMT  02/13/2017  . RADIOLOGY WITH ANESTHESIA N/A 11/25/2016   Procedure: RADIOLOGY WITH ANESTHESIA;  Surgeon: Radiologist, Medication, MD;  Location: [facility] OR;  Service: Radiology;  Laterality: N/A;   Past Medical History:  Diagnosis Date  . Glaucoma   . Hepatitis C   . Stroke [facility])    There were no vitals taken for this visit.  Opioid Risk Score:   Fall Risk Score:  `1  Depression screen PHQ 2/9  Depression screen [facility] 2/9 04/08/2017 12/31/2016 01/26/2016 10/27/2014 07/22/2013  Decreased Interest 0 0 0 0 0  Down, Depressed, Hopeless 0 0 0 0 0    PHQ - 2 Score 0 0 0 0 0  Altered sleeping - 0 - - -  Tired, decreased energy - 0 - - -  Change in appetite - 0 - - -  Feeling bad or failure about yourself  - 0 - - -  Trouble concentrating - 0 - - -  Moving slowly or fidgety/restless - 0 - - -  Suicidal thoughts - 0 - - -  PHQ-9 Score - 0 - - -  Difficult doing work/chores - Not difficult at all - - -     Review of Systems  Constitutional: Negative.   HENT: Negative.   Eyes: Negative.   Respiratory: Negative.   Cardiovascular: Negative.   Gastrointestinal: Negative.   Endocrine: Negative.   Genitourinary: Negative.   Musculoskeletal: Negative.   Skin: Negative.   Allergic/Immunologic: Negative.   Neurological: Negative.   Hematological: Negative.   All other systems reviewed and are negative.      Objective:   Physical Exam  Constitutional: He is oriented to person, place, and time. He appears well-developed and well-nourished. No distress.  HENT:  Head: Normocephalic and atraumatic.  Eyes: Conjunctivae and EOM are normal. Pupils are equal, round, and reactive to light.  Neck: Normal range of motion.  Neurological: He is alert and oriented to person, place, and time.  Mild dysarthria Motor strength is 4- at the left deltoid bicep tricep and pectoralis.  3- at the finger flexors and extensors Decreased fine motor has difficulty with finger to thumb opposition in the little finger on the left side. Right upper extremity strength 5/5 deltoid, bicep, tricep, grip, right lower extremity 5/5 hip flexor knee extensor ankle dorsiflexor LLE 5/5 left hip flexor knee extensor ankle dorsiflexor  Skin: Skin is warm and dry. He is not diaphoretic.  Psychiatric: He has a normal mood and affect.  Nursing note and vitals reviewed.       Assessment & Plan:  #1.  Right MCA infarct with left hemiparesis still has primarily distal weakness in the left upper as well as fine motor deficits.  His speech remains mildly dysarthric.  He  continues to work with outpatient PT OT, will continue through the end of February, I will see him back in March to evaluate his residual deficits. Discussed with patient and his wife agree with plan.

## 2017-05-21 ENCOUNTER — Encounter: Payer: Self-pay | Admitting: Physical Therapy

## 2017-05-21 ENCOUNTER — Encounter: Payer: Self-pay | Admitting: Occupational Therapy

## 2017-05-21 ENCOUNTER — Ambulatory Visit: Payer: Medicare Other | Admitting: Occupational Therapy

## 2017-05-21 ENCOUNTER — Ambulatory Visit (HOSPITAL_COMMUNITY)
Admission: RE | Admit: 2017-05-21 | Discharge: 2017-05-21 | Disposition: A | Discharge status: Home / Self Care | Payer: Medicare Other | Source: Ambulatory Visit | Attending: Interventional Radiology | Admitting: Interventional Radiology

## 2017-05-21 ENCOUNTER — Ambulatory Visit: Payer: Medicare Other | Admitting: Physical Therapy

## 2017-05-21 DIAGNOSIS — M6281 Muscle weakness (generalized): Secondary | ICD-10-CM

## 2017-05-21 DIAGNOSIS — R278 Other lack of coordination: Secondary | ICD-10-CM | POA: Diagnosis not present

## 2017-05-21 DIAGNOSIS — I639 Cerebral infarction, unspecified: Secondary | ICD-10-CM | POA: Diagnosis not present

## 2017-05-21 DIAGNOSIS — I6523 Occlusion and stenosis of bilateral carotid arteries: Secondary | ICD-10-CM | POA: Diagnosis not present

## 2017-05-21 DIAGNOSIS — R2681 Unsteadiness on feet: Secondary | ICD-10-CM | POA: Diagnosis not present

## 2017-05-21 NOTE — Progress Notes (Addendum)
*  PRELIMINARY RESULTS* Vascular Ultrasound Carotid Duplex (Doppler) has been completed.  Preliminary findings:  Right Carotid: Velocities in the right ICA are consistent with a 1-39% stenosis. Right stent noted, patent. Previously seen 40-59% ICA stenosis on the right not identified on this exam.    Left Carotid: Velocities in the left ICA are consistent with a 1-39% stenosis.    Vertebrals:Both vertebral arteries were patent with antegrade flow.   Chauncey FischerCharlotte C Shanyn Preisler 05/21/2017, 11:58 AM

## 2017-05-21 NOTE — Therapy (Signed)
[facility] MAIN [facility] SERVICES [address] [place], [place], [address] Phone: [phone]   Fax:  [phone]  Occupational Therapy Treatment  Patient Details  Name: Jason Nolan MRN: [id number] Date of Birth: Sep 08, 1945 Referring Provider: [name]   Encounter Date: 05/21/2017  OT End of Session - 05/21/17 1507    Visit Number  21    Number of Visits  24    Date for OT Re-Evaluation  07/09/17    OT Start Time  1432    OT Stop Time  1515    OT Time Calculation (min)  43 min    Activity Tolerance  Patient tolerated treatment well    Behavior During Therapy  [facility] for tasks assessed/performed       Past Medical History:  Diagnosis Date  . Glaucoma   . Hepatitis C   . Stroke [facility])     Past Surgical History:  Procedure Laterality Date  . IR ANGIO INTRA EXTRACRAN SEL COM CAROTID INNOMINATE UNI L MOD SED  11/25/2016  . IR ANGIO VERTEBRAL SEL SUBCLAVIAN INNOMINATE UNI R MOD SED  11/25/2016  . IR ANGIO VERTEBRAL SEL VERTEBRAL UNI L MOD SED  11/25/2016  . IR INTRAVSC STENT CERV CAROTID W/O EMB-PROT MOD SED INC ANGIO  11/25/2016  . IR PERCUTANEOUS ART THROMBECTOMY/INFUSION INTRACRANIAL INC DIAG ANGIO  11/25/2016  . IR RADIOLOGIST EVAL & MGMT  02/13/2017  . RADIOLOGY WITH ANESTHESIA N/A 11/25/2016   Procedure: RADIOLOGY WITH ANESTHESIA;  Surgeon: Radiologist, Medication, MD;  Location: [facility] OR;  Service: Radiology;  Laterality: N/A;    There were no vitals filed for this visit.  Subjective Assessment - 05/21/17 1454    Subjective   Pt. conitnues to wear his edema sleeve/glove    Patient is accompained by:  Family member    Pertinent History  Pt. is a 72 y.o. 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 Pt. was transferred to [facility]. He underwent CTA brain showed short segment near occlusion of proximal R-ICA with associated intraluminal thrombus --likely due to dissection and emergent large vessel occlusion with right M2  occlusion and evolving right frontal lobe infarct.  He underwent cerebral angio with  complete revascularization of occluded superior division of R-MCA and near complete revascularization of proximal R-ICA with stent assisted angioplasty.  Stroke felt to be embolic in setting of ruptured plaque R-ICA and he was placed on ASA and brillinta due to stent. Pt. was transferred to inpatient rehab for several weeks, had home health therapy upon discharge, and now is ready for outpatient therapy services.    Limitations  Dominant LUE functioning, left hand edema, distal ROM, coordination, and overal strength.    Currently in Pain?  No/denies      OT TREATMENT    Neuro muscular re-education:  Pt. worked on grasping one inch flat checkers from a flat, nonskid surface with his left hand, and worked on stacking them. Pt. worked on grasping and storing 1/2Public librarian. Pt. was able to gras, and store 7 at a time. Pt. worked on isolating his 2nd digit during tasks. Pt. continues to have difficulty isolating second digit when the 3rd, 4th, and 5th digits are flexed.     Therapeutic Exercise:  Pt. Worked on the [employer] for 8 min. With constant monitoring of the BUEs. Pt. Worked on changing, and alternating forward reverse position every 2 min. Rest breaks were required. Pt. performed gross gripping with grip strengthener. Pt. worked on  sustaining grip while grasping pegs and reaching at various heights. Gripper was placed in the 3rd, and 4th resistive slot with the white resistive spring.                          OT Education - 05/21/17 1507    Education provided  Yes    Education Details  RUE strength, and coordination    Person(s) Educated  Patient    Methods  Explanation    Comprehension  Verbalized understanding;Returned demonstration          OT Long Term Goals - 04/16/17 1633      OT LONG TERM GOAL #1   Title  Pt. will improve edema by 2 cm through MPs in preparation  for ROM.    Baseline  improving    Time  12    Period  Weeks    Status  On-going    Target Date  07/09/17      OT LONG TERM GOAL #2   Title  Pt. will improve Left hand digit extension in preparation for weightbearing, and releasing objects during ADLs/IADLs.    Baseline  Pt. is unable to achieve full digit extension on the left.    Time  12    Period  Weeks    Status  On-going    Target Date  07/09/17      OT LONG TERM GOAL #3   Title  Pt. left hand grip with  increase by 5# in preparation for holding a cup.    Baseline  Left 2#, RIght: 60    Time  12    Period  Weeks    Status  On-going    Target Date  07/09/17      OT LONG TERM GOAL #4   Title  Pt. will increased right shoulder strength by 1 mm grade to assist with ADLs.    Baseline  Impaired LUE strength    Time  12    Period  Weeks    Target Date  07/09/17      OT LONG TERM GOAL #5   Title  Pt. will increase left wrist extension in preparation for reaching ADL items.    Baseline  wrist extension 28 degrees    Time  12    Period  Weeks    Status  On-going    Target Date  07/09/17      OT LONG TERM GOAL #6   Title  Pt. will improve left hand coordination skills to be able to button clothing.    Baseline  Pt. conitnues to have difficulty    Time  12    Period  Weeks    Status  On-going    Target Date  07/09/17      OT LONG TERM GOAL #7   Title  Pt. will independently demonstrate visual compensatory strategies duirng ADLs, and IADLs.    Baseline  Glaucoma    Time  12    Period  Weeks    Status  On-going    Target Date  07/09/17            Plan - 05/21/17 1508    Clinical Impression Statement  Pt. edema is improving. Pt. presented with limited LUE strength, and coordination skills. Pt. worked on grasping, and storing 1/2[employer]arian" circular objects. Pt. continues to work on improving LUE ROM, and strengthening for improved ADL, and IADL tasks.     Occupational  performance deficits (Please refer to evaluation for  details):  ADL's;IADL's    Rehab Potential  Excellent    OT Frequency  2x / week    OT Duration  12 weeks    OT Treatment/Interventions  Self-care/ADL training;Therapeutic exercise;Neuromuscular education;Patient/family education;Energy conservation;Therapeutic activities;DME and/or AE instruction    Clinical Decision Making  Multiple treatment options, significant modification of task necessary    Consulted and Agree with Plan of Care  Patient       Patient will benefit from skilled therapeutic intervention in order to improve the following deficits and impairments:  Abnormal gait, Pain, Decreased strength, Decreased range of motion, Impaired UE functional use, Decreased coordination, Decreased knowledge of use of DME, Decreased activity tolerance, Difficulty walking, Decreased balance, Other (comment)  Visit Diagnosis: Muscle weakness (generalized)  Other lack of coordination    Problem List Patient Active Problem List   Diagnosis Date Noted  . Left hemiparesis (HCC)   . Essential hypertension 11/29/2016  . 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 Hepatitis C 07/22/2013    Olegario Messier, MS, OTR/L 05/21/2017, 3:30 PM  [employer] [facility] MAIN [facility] SERVICES [address] [place], [place], [address] Phone: [phone]   Fax:  [phone]  Name: Jason Nolan MRN: [id number] Date of Birth: 07-10-1945

## 2017-05-21 NOTE — Therapy (Signed)
[facility] MAIN [facility] SERVICES [address] [place], [place], [address] Phone: [phone]   Fax:  [phone]  Physical Therapy Treatment  Patient Details  Name: Jason Nolan MRN: [id number] Date of Birth: January 10, 1946 Referring Provider: [name]   Encounter Date: 05/21/2017  PT End of Session - 05/21/17 1352    Visit Number  25    Number of Visits  46    Date for PT Re-Evaluation  06/16/17    PT Start Time  0145    PT Stop Time  0230    PT Time Calculation (min)  45 min    Equipment Utilized During Treatment  Gait belt    Activity Tolerance  Patient tolerated treatment well    Behavior During Therapy  [facility] for tasks assessed/performed       Past Medical History:  Diagnosis Date  . Glaucoma   . Hepatitis C   . Stroke [facility])     Past Surgical History:  Procedure Laterality Date  . IR ANGIO INTRA EXTRACRAN SEL COM CAROTID INNOMINATE UNI L MOD SED  11/25/2016  . IR ANGIO VERTEBRAL SEL SUBCLAVIAN INNOMINATE UNI R MOD SED  11/25/2016  . IR ANGIO VERTEBRAL SEL VERTEBRAL UNI L MOD SED  11/25/2016  . IR INTRAVSC STENT CERV CAROTID W/O EMB-PROT MOD SED INC ANGIO  11/25/2016  . IR PERCUTANEOUS ART THROMBECTOMY/INFUSION INTRACRANIAL INC DIAG ANGIO  11/25/2016  . IR RADIOLOGIST EVAL & MGMT  02/13/2017  . RADIOLOGY WITH ANESTHESIA N/A 11/25/2016   Procedure: RADIOLOGY WITH ANESTHESIA;  Surgeon: Radiologist, Medication, MD;  Location: [facility] OR;  Service: Radiology;  Laterality: N/A;    There were no vitals filed for this visit.  Subjective Assessment - 05/21/17 1351    Subjective  Patinet is doing fine today.No new concerns. Patient is doing his HEP; walking and doing his theraband exercises.    Pertinent History  Pt. is a 72 y.o. 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 Pt. was transferred to [facility]. He underwent CTA brain showed short segment near occlusion of proximal R-ICA with associated intraluminal  thrombus --likely due to dissection and emergent large vessel occlusion with right M2 occlusion and evolving right frontal lobe infarct.  He underwent cerebral angio with  complete revascularization of occluded superior division of R-MCA and near complete revascularization of proximal R-ICA with stent assisted angioplasty.  Stroke felt to be embolic in setting of ruptured plaque R-ICA and he was placed on ASA and brillinta due to stent. Pt. was transferred to inpatient rehab for several weeks, had home health therapy upon discharge.    Limitations  Walking;Lifting;House hold activities    How long can you sit comfortably?  unlimited     How long can you stand comfortably?  unlimited     How long can you walk comfortably?  unsure    Patient Stated Goals  "strengthen my whole L side"    Currently in Pain?  No/denies    Pain Score  0-No pain       Treatment:  [employer] fitness x 5 mins  Neuromuscular training:  Star reaching with cones and single leg x 12 cones BLE single leg  Standing on blue foam tandem and reaching down for cones  Standing on blue foam and ball alternating with L and R foot,  and reaching down for cones  Rocker board horizontal with cone reaching to low stool x 20 cones    Patient needs CGA  and occasional use of UE for dynamic standing balance training                           PT Education - 05/21/17 1352    Education provided  Yes    Education Details  HEP technique    Person(s) Educated  Patient    Methods  Explanation    Comprehension  Verbalized understanding;Returned demonstration       PT Short Term Goals - 04/23/17 1442      PT SHORT TERM GOAL #1   Title  Pt will improve 5x sit to stand to 18 seconds in order to demonstrate improved LE strength and improved dynamic balance.    Baseline  21 sec; 02/26/17: 16.1s    Time  6    Period  Weeks    Status  Achieved      PT SHORT TERM GOAL #2   Title  Pt will improve gait speed to at  least 1.0 m/s with proper heel strike and decreased hip hike in order to demonstrate imrpoved LE strength and balance.    Baseline  0.83 m/s; 02/26/17: 1.11 m/s    Time  6    Period  Weeks    Status  Achieved      PT SHORT TERM GOAL #3   Title  Pt will improve Berg Balance score to 51/56 in order to demonstrate improved dynamic and static balance, decreasing overall falls risk.    Baseline  48/56; 02/26/17: 53/56    Time  6    Period  Weeks    Status  Achieved        PT Long Term Goals - 04/23/17 1434      PT LONG TERM GOAL #1   Title  Pt will demonstrate independence with HEP in order to continue LE strengthening and manage symptoms.    Time  12    Period  Weeks    Status  On-going    Target Date  06/16/17      PT LONG TERM GOAL #2   Title  Pt will improve 5x sit to stand to <15 seconds in order to demonstrate improved LE strength and dynamic balance.    Baseline  21 sec; 02/26/17: 16.1s 16.50 sec 04/23/17    Time  12    Period  Weeks    Status  On-going    Target Date  06/16/17      PT LONG TERM GOAL #3   Title  Pt will demonstrate gait speed of at least 1.2 m/s in order to demonstrate improved LE strength and improved balance in order to return to prior level of funciton.    Baseline  0.833 m/s 02/26/17: 1.11 m/s, 12/189/18 1.16 m/sec    Time  12    Period  Weeks    Status  On-going    Target Date  06/16/17      PT LONG TERM GOAL #4   Title  Pt will improve Berg Balance score to at least 54/56 in order demonstrate improved dynamic and static balance, decreasing overall falls risk.    Baseline  48/56; 02/26/17: 53/56; 04/23/17  53/56    Time  12    Period  Weeks    Status  On-going    Target Date  06/16/17      PT LONG TERM GOAL #5   Title  Pt will demonstrate improved LE strength to 4+/5 in all limited  planes in order to return to prior level of function.    Baseline  gross strength: 4/5; 02/26/17: deferred strength testing    Time  12    Period  Weeks     Status  On-going    Target Date  06/16/17      PT LONG TERM GOAL #6   Title  Pt will improve to >1500 feet without using AD in order to return to prior level of function, demonstrating improvements in LE strength, balance and endurance.    Baseline  1205 ft without AD; 02/26/17: deferred to next session 04/23/17 1420 ft    Time  12    Period  Weeks    Status  On-going              Patient will benefit from skilled therapeutic intervention in order to improve the following deficits and impairments:     Visit Diagnosis: Muscle weakness (generalized)  Other lack of coordination  Unsteadiness on feet     Problem List Patient Active Problem List   Diagnosis Date Noted  . Left hemiparesis (HCC)   . Essential hypertension 11/29/2016  . 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 Hepatitis C 07/22/2013    Ezekiel Ina , [place] DPT 05/21/2017, 2:44 PM  [facility] MAIN [facility] SERVICES [address] [place], [place], [address] Phone: [phone]   Fax:  [phone]  Name: Jason Nolan MRN: [id number] Date of Birth: [birth year]

## 2017-05-22 ENCOUNTER — Encounter: Payer: Self-pay | Admitting: Internal Medicine

## 2017-05-26 ENCOUNTER — Ambulatory Visit: Payer: Medicare Other | Admitting: Physical Therapy

## 2017-05-26 ENCOUNTER — Telehealth (HOSPITAL_COMMUNITY): Payer: Self-pay

## 2017-05-26 ENCOUNTER — Encounter: Payer: Self-pay | Admitting: Occupational Therapy

## 2017-05-26 ENCOUNTER — Ambulatory Visit: Payer: Medicare Other | Admitting: Occupational Therapy

## 2017-05-26 ENCOUNTER — Encounter: Payer: Self-pay | Admitting: Physical Therapy

## 2017-05-26 DIAGNOSIS — M6281 Muscle weakness (generalized): Secondary | ICD-10-CM | POA: Diagnosis not present

## 2017-05-26 DIAGNOSIS — R2681 Unsteadiness on feet: Secondary | ICD-10-CM

## 2017-05-26 DIAGNOSIS — R278 Other lack of coordination: Secondary | ICD-10-CM

## 2017-05-26 NOTE — Telephone Encounter (Signed)
Pt agreed to f/u in 6 months with us carotid. AW 

## 2017-05-26 NOTE — Therapy (Signed)
[facility] [address] [place], [place], [address] Phone: [phone]   Fax:  [phone]  Physical Therapy Treatment  Patient Details  Name: Jason CroakJames Nolan MRN: [id number] Date of Birth: [birth year] Referring Provider: Lorre MunroeBAITY, REGINA W   Encounter Date: 05/26/2017  PT End of Session - 05/26/17 1348    Visit Number  26    Number of Visits  46    Date for PT Re-Evaluation  06/16/17    PT Start Time  0145    PT Stop Time  0230    PT Time Calculation (min)  45 min    Equipment Utilized During Treatment  Gait belt    Activity Tolerance  Patient tolerated treatment well    Behavior During Therapy  [facility] for tasks assessed/performed       Past Medical History:  Diagnosis Date  . Glaucoma   . Hepatitis C   . Stroke [facility](HCC)     Past Surgical History:  Procedure Laterality Date  . IR ANGIO INTRA EXTRACRAN SEL COM CAROTID INNOMINATE UNI L MOD SED  11/25/2016  . IR ANGIO VERTEBRAL SEL SUBCLAVIAN INNOMINATE UNI R MOD SED  11/25/2016  . IR ANGIO VERTEBRAL SEL VERTEBRAL UNI L MOD SED  11/25/2016  . IR INTRAVSC STENT CERV CAROTID W/O EMB-PROT MOD SED INC ANGIO  11/25/2016  . IR PERCUTANEOUS ART THROMBECTOMY/INFUSION INTRACRANIAL INC DIAG ANGIO  11/25/2016  . IR RADIOLOGIST EVAL & MGMT  02/13/2017  . RADIOLOGY WITH ANESTHESIA N/A 11/25/2016   Procedure: RADIOLOGY WITH ANESTHESIA;  Surgeon: Radiologist, Medication, MD;  Location: [facility] OR;  Service: Radiology;  Laterality: N/A;    There were no vitals filed for this visit.  Subjective Assessment - 05/26/17 1348    Subjective  Patinet is doing fine today.No new concerns. Patient is doing his HEP; walking and doing his theraband exercises.    Pertinent History  Pt. is a 72 y.o. 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 [facility] He underwent CTA brain showed short segment near occlusion of proximal R-ICA with associated intraluminal  thrombus --likely due to dissection and emergent large vessel occlusion with right M2 occlusion and evolving right frontal lobe infarct.  He underwent cerebral angio with  complete revascularization of occluded superior division of R-MCA and near complete revascularization of proximal R-ICA with stent assisted angioplasty.  Stroke felt to be embolic in setting of ruptured plaque R-ICA and he was placed on ASA and brillinta due to stent. Pt. was transferred to inpatient rehab for several weeks, had home health therapy upon discharge.    Limitations  Walking;Lifting;House hold activities    How long can you sit comfortably?  unlimited     How long can you stand comfortably?  unlimited     How long can you walk comfortably?  unsure    Patient Stated Goals  "strengthen my whole L side"    Currently in Pain?  No/denies    Pain Score  0-No pain      Neuromuscular training:  1/2 foam flat side up/ 1/2 foam flat side down with tandem stand and weight shifting x 20 x 2   1/2 foam with one foot on ball and no UE support x 2 mins x 3  baps board with middle ball and CW, CCW, side to side x 20 each way  Matrix with 17. 5 lbs fwd/ bwds/ side to side x 5 reps  Patient needs  cues for posture correction and weight shifting.                         PT Education - 05/26/17 1348    Education provided  Yes    Education Details  HEP    Person(s) Educated  Patient    Methods  Explanation    Comprehension  Verbalized understanding       PT Short Term Goals - 04/23/17 1442      PT SHORT TERM GOAL #1   Title  Pt will improve 5x sit to stand to 18 seconds in order to demonstrate improved LE strength and improved dynamic balance.    Baseline  21 sec; 02/26/17: 16.1s    Time  6    Period  Weeks    Status  Achieved      PT SHORT TERM GOAL #2   Title  Pt will improve gait speed to at least 1.0 m/s with proper heel strike and decreased hip hike in order to demonstrate imrpoved LE  strength and balance.    Baseline  0.83 m/s; 02/26/17: 1.11 m/s    Time  6    Period  Weeks    Status  Achieved      PT SHORT TERM GOAL #3   Title  Pt will improve Berg Balance score to 51/56 in order to demonstrate improved dynamic and static balance, decreasing overall falls risk.    Baseline  48/56; 02/26/17: 53/56    Time  6    Period  Weeks    Status  Achieved        PT Long Term Goals - 05/26/17 1528      PT LONG TERM GOAL #1   Title  Pt will demonstrate independence with HEP in order to continue LE strengthening and manage symptoms.    Time  12    Period  Weeks    Status  On-going    Target Date  06/16/17      PT LONG TERM GOAL #2   Title  Pt will improve 5x sit to stand to <15 seconds in order to demonstrate improved LE strength and dynamic balance.    Baseline  21 sec; 02/26/17: 16.1s 16.50 sec 04/23/17; 16 sec 05/26/17    Time  12    Period  Weeks    Status  On-going    Target Date  06/16/17      PT LONG TERM GOAL #3   Title  Pt will demonstrate gait speed of at least 1.2 m/s in order to demonstrate improved LE strength and improved balance in order to return to prior level of funciton.    Baseline  0.833 m/s 02/26/17: 1.11 m/s, 12/189/18 1.16 m/sec; 1.23 m/sec    Time  12    Period  Weeks    Status  On-going    Target Date  06/16/17      PT LONG TERM GOAL #4   Title  Pt will improve Berg Balance score to at least 54/56 in order demonstrate improved dynamic and static balance, decreasing overall falls risk.    Baseline  48/56; 02/26/17: 53/56; 04/23/17  53/56    Time  12    Period  Weeks    Status  On-going      PT LONG TERM GOAL #5   Title  Pt will demonstrate improved LE strength to 4+/5 in all limited planes in order to return to prior level of  function.    Baseline  gross strength: 4/5; 02/26/17: deferred strength testing    Time  12    Period  Weeks    Status  On-going    Target Date  06/16/17      PT LONG TERM GOAL #6   Title  Pt will improve  to >1500 feet without using AD in order to return to prior level of function, demonstrating improvements in LE strength, balance and endurance.    Baseline  1205 ft without AD; 02/26/17: deferred to next session 04/23/17 1420 ft    Time  12    Period  Weeks    Status  On-going            Plan - 05/26/17 1350    Clinical Impression Statement  Patient demonstrates decreased dynamic standing balance and requires verbal and tactile cueing to maintain center of gravity during dynamic balance activities. Patient also requires CGA during all dynamic standing balance activities falls. He was able to perform beginning and intermediate strengthening in closed and open chain without pain behaviors.  Patient requires consistent cueing to maintain correct position during balance activities.  Patient demonstrates difficulty with dynamic standing balance and increased postural sway while on purple foam. Patient will continue to benefit from skilled therapy in order to improve dynamic standing balance and increase endurance    Rehab Potential  Good    Clinical Impairments Affecting Rehab Potential  high prior level of function    PT Frequency  2x / week    PT Duration  12 weeks    PT Treatment/Interventions  ADLs/Self Care Home Management;Cryotherapy;Moist Heat;Gait training;Stair training;Functional mobility training;Therapeutic activities;Therapeutic exercise;Balance training;Neuromuscular re-education;Patient/family education;Manual techniques    PT Next Visit Plan  , MMT, update those specific goals, continue strength/balance;    PT Home Exercise Plan  Add green theraband to HEP exercises    Consulted and Agree with Plan of Care  Patient       Patient will benefit from skilled therapeutic intervention in order to improve the following deficits and impairments:  Abnormal gait, Decreased balance, Decreased strength, Difficulty walking, Increased edema  Visit Diagnosis: Muscle weakness  (generalized)  Other lack of coordination  Unsteadiness on feet     Problem List Patient Active Problem List   Diagnosis Date Noted  . Left hemiparesis (HCC)   . Essential hypertension 11/29/2016  . 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 Hepatitis C 07/22/2013    Ezekiel Ina, [place] DPT 05/26/2017, 3:30 PM  [facility] MAIN [facility] SERVICES [address] [place], [place], [address] Phone: [phone]   Fax:  [phone]  Name: Jason Nolan MRN: [id number] Date of Birth: 1946-04-25

## 2017-05-26 NOTE — Therapy (Signed)
[facility] [address] [place], [place], [address] Phone: [phone]   Fax:  [phone]  Occupational Therapy Treatment  Patient Details  Name: Jason CroakJames Nolan MRN: [id number] Date of Birth: February 25, 1946 Referring Provider: Lorre MunroeBAITY, REGINA W   Encounter Date: 05/26/2017  OT End of Session - 05/26/17 1449    Visit Number  22    Number of Visits  24    Date for OT Re-Evaluation  07/09/17    OT Start Time  1433    OT Stop Time  1515    OT Time Calculation (min)  42 min    Activity Tolerance  Patient tolerated treatment well    Behavior During Therapy  [facility] for tasks assessed/performed       Past Medical History:  Diagnosis Date  . Glaucoma   . Hepatitis C   . Stroke [facility](HCC)     Past Surgical History:  Procedure Laterality Date  . IR ANGIO INTRA EXTRACRAN SEL COM CAROTID INNOMINATE UNI L MOD SED  11/25/2016  . IR ANGIO VERTEBRAL SEL SUBCLAVIAN INNOMINATE UNI R MOD SED  11/25/2016  . IR ANGIO VERTEBRAL SEL VERTEBRAL UNI L MOD SED  11/25/2016  . IR INTRAVSC STENT CERV CAROTID W/O EMB-PROT MOD SED INC ANGIO  11/25/2016  . IR PERCUTANEOUS ART THROMBECTOMY/INFUSION INTRACRANIAL INC DIAG ANGIO  11/25/2016  . IR RADIOLOGIST EVAL & MGMT  02/13/2017  . RADIOLOGY WITH ANESTHESIA N/A 11/25/2016   Procedure: RADIOLOGY WITH ANESTHESIA;  Surgeon: Radiologist, Medication, MD;  Location: [facility] OR;  Service: Radiology;  Laterality: N/A;    There were no vitals filed for this visit.  Subjective Assessment - 05/26/17 1448    Subjective   Pt. reports that he is not sleeping too well.    Patient is accompained by:  Family member    Pertinent History  Pt. is a 72 y.o. 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 [facility] He underwent CTA brain showed short segment near occlusion of proximal R-ICA with associated intraluminal thrombus --likely due to dissection and emergent large vessel occlusion with right M2  occlusion and evolving right frontal lobe infarct.  He underwent cerebral angio with  complete revascularization of occluded superior division of R-MCA and near complete revascularization of proximal R-ICA with stent assisted angioplasty.  Stroke felt to be embolic in setting of ruptured plaque R-ICA and he was placed on ASA and brillinta due to stent. Pt. was transferred to inpatient rehab for several weeks, had home health therapy upon discharge, and now is ready for outpatient therapy services.    Limitations  Dominant LUE functioning, left hand edema, distal ROM, coordination, and overal strength.    Currently in Pain?  No/denies       OT TREATMENT    Neuro muscular re-education:  Pt. Worked on grasping coins from a tabletop surface, placing them into a resistive container, and pushing them through the slot while isolating his 2nd digit. A resistive mat was placed under coins to aide in manipulating the coins and prevent sliding when picking them up. Pt. Worked on isolating his 2nd digit.  Therapeutic Exercise:  Pt. Worked on the [employer] for 8 min. With constant monitoring of the BUEs. Pt. Worked on changing, and alternating forward reverse position every 2 min. Rest breaks were required. Pt. performed gross gripping with grip strengthener. Pt. worked on sustaining grip while grasping pegs and reaching at various heights. Gripper was placed in the  3rd resistive slot with the white resistive spring. Pt. Worked on pinch strengthening in the left hand for lateral, and 3pt. pinch using yellow and red resistive clips. Pt. worked on placing the clips at various vertical and horizontal angles. Tactile and verbal cues were required for eliciting the desired movement.                         OT Education - 05/26/17 1449    Education provided  Yes    Education Details  HEP    Person(s) Educated  Patient    Methods  Explanation    Comprehension  Verbalized understanding           OT Long Term Goals - 04/16/17 1633      OT LONG TERM GOAL #1   Title  Pt. will improve edema by 2 cm through MPs in preparation for ROM.    Baseline  improving    Time  12    Period  Weeks    Status  On-going    Target Date  07/09/17      OT LONG TERM GOAL #2   Title  Pt. will improve Left hand digit extension in preparation for weightbearing, and releasing objects during ADLs/IADLs.    Baseline  Pt. is unable to achieve full digit extension on the left.    Time  12    Period  Weeks    Status  On-going    Target Date  07/09/17      OT LONG TERM GOAL #3   Title  Pt. left hand grip with  increase by 5# in preparation for holding a cup.    Baseline  Left 2#, RIght: 60    Time  12    Period  Weeks    Status  On-going    Target Date  07/09/17      OT LONG TERM GOAL #4   Title  Pt. will increased right shoulder strength by 1 mm grade to assist with ADLs.    Baseline  Impaired LUE strength    Time  12    Period  Weeks    Target Date  07/09/17      OT LONG TERM GOAL #5   Title  Pt. will increase left wrist extension in preparation for reaching ADL items.    Baseline  wrist extension 28 degrees    Time  12    Period  Weeks    Status  On-going    Target Date  07/09/17      OT LONG TERM GOAL #6   Title  Pt. will improve left hand coordination skills to be able to button clothing.    Baseline  Pt. conitnues to have difficulty    Time  12    Period  Weeks    Status  On-going    Target Date  07/09/17      OT LONG TERM GOAL #7   Title  Pt. will independently demonstrate visual compensatory strategies duirng ADLs, and IADLs.    Baseline  Glaucoma    Time  12    Period  Weeks    Status  On-going    Target Date  07/09/17            Plan - 05/26/17 1451    Clinical Impression Statement  Pt. reports he has been having more headaches that are similiar to the one he had during his stroke. Pt. Has an  MD appointment this week. Pt. continues to work on improving UE  strength, and coordination skills for improved participation in ADL, and IADL tasks.    Occupational performance deficits (Please refer to evaluation for details):  ADL's;IADL's    Rehab Potential  Excellent    OT Frequency  2x / week    OT Duration  12 weeks    OT Treatment/Interventions  Self-care/ADL training;Therapeutic exercise;Neuromuscular education;Patient/family education;Energy conservation;Therapeutic activities;DME and/or AE instruction    Clinical Decision Making  Multiple treatment options, significant modification of task necessary    Consulted and Agree with Plan of Care  Patient       Patient will benefit from skilled therapeutic intervention in order to improve the following deficits and impairments:  Abnormal gait, Pain, Decreased strength, Decreased range of motion, Impaired UE functional use, Decreased coordination, Decreased knowledge of use of DME, Decreased activity tolerance, Difficulty walking, Decreased balance, Other (comment)  Visit Diagnosis: Muscle weakness (generalized)  Other lack of coordination    Problem List Patient Active Problem List   Diagnosis Date Noted  . Left hemiparesis (HCC)   . Essential hypertension 11/29/2016  . 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 Hepatitis C 07/22/2013    Olegario Messier, MS, OTR/L 05/26/2017, 3:07 PM  [facility] MAIN [facility] SERVICES [address] [place], [place], [address] Phone: [phone]   Fax:  [phone]  Name: Jason Nolan MRN: [id number] Date of Birth: [birth year]

## 2017-05-28 ENCOUNTER — Encounter: Payer: Self-pay | Admitting: Internal Medicine

## 2017-05-28 ENCOUNTER — Ambulatory Visit (INDEPENDENT_AMBULATORY_CARE_PROVIDER_SITE_OTHER): Payer: Medicare Other | Admitting: Internal Medicine

## 2017-05-28 VITALS — BP 130/84 | HR 58 | Temp 97.9°F | Ht 70.0 in | Wt 202.0 lb

## 2017-05-28 DIAGNOSIS — Z0000 Encounter for general adult medical examination without abnormal findings: Secondary | ICD-10-CM

## 2017-05-28 DIAGNOSIS — Z0001 Encounter for general adult medical examination with abnormal findings: Secondary | ICD-10-CM

## 2017-05-28 DIAGNOSIS — Z125 Encounter for screening for malignant neoplasm of prostate: Secondary | ICD-10-CM | POA: Diagnosis not present

## 2017-05-28 DIAGNOSIS — I69154 Hemiplegia and hemiparesis following nontraumatic intracerebral hemorrhage affecting left non-dominant side: Secondary | ICD-10-CM

## 2017-05-28 DIAGNOSIS — I10 Essential (primary) hypertension: Secondary | ICD-10-CM | POA: Diagnosis not present

## 2017-05-28 DIAGNOSIS — B182 Chronic viral hepatitis C: Secondary | ICD-10-CM | POA: Diagnosis not present

## 2017-05-28 DIAGNOSIS — I639 Cerebral infarction, unspecified: Secondary | ICD-10-CM

## 2017-05-28 DIAGNOSIS — G8194 Hemiplegia, unspecified affecting left nondominant side: Secondary | ICD-10-CM

## 2017-05-28 DIAGNOSIS — N529 Male erectile dysfunction, unspecified: Secondary | ICD-10-CM | POA: Diagnosis not present

## 2017-05-28 DIAGNOSIS — E7800 Pure hypercholesterolemia, unspecified: Secondary | ICD-10-CM | POA: Diagnosis not present

## 2017-05-28 LAB — COMPREHENSIVE METABOLIC PANEL
ALK PHOS: 68 U/L (ref 39–117)
ALT: 32 U/L (ref 0–53)
AST: 21 U/L (ref 0–37)
Albumin: 4.7 g/dL (ref 3.5–5.2)
BUN: 14 mg/dL (ref 6–23)
CALCIUM: 9.4 mg/dL (ref 8.4–10.5)
CHLORIDE: 104 meq/L (ref 96–112)
CO2: 25 mEq/L (ref 19–32)
Creatinine, Ser: 0.91 mg/dL (ref 0.40–1.50)
GFR: 106.15 mL/min (ref >60.00)
Glucose, Bld: 81 mg/dL (ref 70–99)
POTASSIUM: 4 meq/L (ref 3.5–5.1)
Sodium: 140 mEq/L (ref 135–145)
TOTAL PROTEIN: 7.6 g/dL (ref 6.0–8.3)
Total Bilirubin: 0.9 mg/dL (ref 0.2–1.2)

## 2017-05-28 LAB — LIPID PANEL
CHOLESTEROL: 125 mg/dL (ref 0–200)
HDL: 68 mg/dL (ref >39.00)
LDL CALC: 49 mg/dL (ref 0–99)
NonHDL: 57.09
TRIGLYCERIDES: 41 mg/dL (ref 0.0–149.0)
Total CHOL/HDL Ratio: 2
VLDL: 8.2 mg/dL (ref 0.0–40.0)

## 2017-05-28 LAB — CBC
HEMATOCRIT: 44.6 % (ref 39.0–52.0)
HEMOGLOBIN: 15 g/dL (ref 13.0–17.0)
MCHC: 33.6 g/dL (ref 30.0–36.0)
MCV: 92 fl (ref 78.0–100.0)
PLATELETS: 201 10*3/uL (ref 150.0–400.0)
RBC: 4.85 Mil/uL (ref 4.22–5.81)
RDW: 14.5 % (ref 11.5–15.5)
WBC: 2.9 10*3/uL — ABNORMAL LOW (ref 4.0–10.5)

## 2017-05-28 LAB — PSA, MEDICARE: PSA: 0.36 ng/ml (ref 0.10–4.00)

## 2017-05-28 MED ORDER — VARDENAFIL HCL 10 MG PO TABS: 10.0000 mg | ORAL_TABLET | Freq: Every day | ORAL | 2 refills | Status: DC | PRN

## 2017-05-28 NOTE — Patient Instructions (Signed)

## 2017-05-28 NOTE — Assessment & Plan Note (Signed)
Continue PT/OT

## 2017-05-28 NOTE — Assessment & Plan Note (Signed)
Will trial Levitra, eRx sent to pharmacy

## 2017-05-28 NOTE — Assessment & Plan Note (Signed)
S/p antiviral therapy No further treatment needed

## 2017-05-28 NOTE — Progress Notes (Signed)
HPI:  Pt presents to the clinic today for his Medicare Wellness Exam. He is also due to follow up chronic conditions.  HTN: His BP today is 130/84. He reports it has been running about 122/68. He is taking Amlodipine daily as prescribed. ECG from 11/2016 reviewed.  History of Hep C: s/p antiviral therapy. He is no longer following with [facility].  HLD/Stroke with Left Hemiparesis: He continues to have some left side weakness. He is still going to PT/OT. He is taking Amlodipine, Atorvastatin, ASA and Brillinta as prescribed.   He reports he has been having headaches. This started 1 week ago. He describes the pain as a achy. He denies visual changes and dizziness. He has tried multiple ASA daily. He denies headache today.  He also reports difficulty initiating and maintaining an erection. This has been going on for years, but worse after his stroke. He has taken Viagra and Cialis in the past.  Past Medical History:  Diagnosis Date  . Glaucoma   . Hepatitis C   . Stroke [facility])     Current Outpatient Medications  Medication Sig Dispense Refill  . amLODipine (NORVASC) 10 MG tablet TAKE 1 TABLET DAILY 90 tablet 0  . aspirin 81 MG chewable tablet Chew 1 tablet (81 mg total) by mouth daily.    Marland Kitchen atorvastatin (LIPITOR) 40 MG tablet TAKE 1 TABLET DAILY AT 6 P.M. 90 tablet 0  . BRILINTA 90 MG TABS tablet TAKE 1 TABLET TWICE A DAY 180 tablet 0  . PAZEO 0.7 % SOLN Place 1 drop into both eyes daily.     . timolol (TIMOPTIC) 0.5 % ophthalmic solution Place 1 drop into the right eye daily.     Marland Kitchen VYZULTA 0.024 % SOLN Apply 1 drop to eye at bedtime.      No current facility-administered medications for this visit.     No Known Allergies  Family History  Problem Relation Age of Onset  . Hypertension Mother   . Hypertension Father     Social History   Socioeconomic History  . Marital status: Married    Spouse name: Not on file  . Number of children: Not on file  . Years of  education: Not on file  . Highest education level: Not on file  Social Needs  . Financial resource strain: Not on file  . Food insecurity - worry: Not on file  . Food insecurity - inability: Not on file  . Transportation needs - medical: Not on file  . Transportation needs - non-medical: Not on file  Occupational History  . Not on file  Tobacco Use  . Smoking status: Former Games developer  . Smokeless tobacco: Never Used  Substance and Sexual Activity  . Alcohol use: No    Alcohol/week: 0.0 oz  . Drug use: No  . Sexual activity: Yes    Partners: Female    Birth control/protection: None  Other Topics Concern  . Not on file  Social History Narrative  . Not on file    Hospitiliaztions: 11/2016, stroke   Health Maintenance:    Flu: 02/2017  Tetanus: 05/2012  Pneumovax: 10/2014  Prevnar: 07/2013  Zostavax: 02/2014   Shingrix: 12/2016  PSA: 01/2016  Colon Screening: 05/2008  Eye Doctor: biannually  Dental Exam: biannually   Providers:   PCP: Nicki Reaper, NP-C  Neurologist: Dr. Pearlean Brownie  Physical Medicine: Dr. Wynn Banker   I have personally reviewed and have noted:  1. The patient's medical and social history 2.  Their use of alcohol, tobacco or illicit drugs 3. Their current medications and supplements 4. The patient's functional ability including ADL's, fall risks, home safety risks and hearing or visual impairment. 5. Diet and physical activities 6. Evidence for depression or mood disorder  Subjective:   Review of Systems:   Constitutional: Pt reports intermittent headaches. Denies fever, malaise, fatigue, or abrupt weight changes.  HEENT: Denies eye pain, eye redness, ear pain, ringing in the ears, wax buildup, runny nose, nasal congestion, bloody nose, or sore throat. Respiratory: Denies difficulty breathing, shortness of breath, cough or sputum production.   Cardiovascular: Denies chest pain, chest tightness, palpitations or swelling in the hands or feet.   Gastrointestinal: Denies abdominal pain, bloating, constipation, diarrhea or blood in the stool.  GU: Pt reports urinary frequency, erectile dysfunction. Denies urgency, pain with urination, burning sensation, blood in urine, odor or discharge. Musculoskeletal: Pt reports LUE weakness. Denies decrease in range of motion, difficulty with gait, muscle pain or joint pain and swelling.  Skin: Denies redness, rashes, lesions or ulcercations.  Neurological: Pt reports difficulty with speech, balance and coordination. Denies dizziness, difficulty with memory, difficulty with speech.  Psych: Denies anxiety, depression, SI/HI.  No other specific complaints in a complete review of systems (except as listed in HPI above).  Objective:  PE:   BP 130/84   Pulse (!) 58   Temp 97.9 F (36.6 C) (Oral)   Ht 5\' 10"  (1.778 m)   Wt 202 lb (91.6 kg)   SpO2 98%   BMI 28.98 kg/m   Wt Readings from Last 3 Encounters:  02/10/17 200 lb (90.7 kg)  01/28/17 197 lb (89.4 kg)  01/14/17 198 lb 8 oz (90 kg)    General: Appears his stated age, well developed, well nourished in NAD. Skin: Warm, dry and intact.  HEENT: Head: normal shape and size; Eyes: sclera white, no icterus, conjunctiva pink, PERRLA and EOMs intact; Ears: Tm's gray and intact, normal light reflex; Throat/Mouth: Teeth present, mucosa pink and moist, no exudate, lesions or ulcerations noted.  Neck: Neck supple, trachea midline. No masses, lumps or thyromegaly present.  Cardiovascular: Bradycardic with normal rhythm. S1,S2 noted.  No murmur, rubs or gallops noted. No JVD or BLE edema. No carotid bruits noted. Pulmonary/Chest: Normal effort and positive vesicular breath sounds. No respiratory distress. No wheezes, rales or ronchi noted.  Abdomen: Soft and nontender. Normal bowel sounds. No distention or masses noted. Musculoskeletal: Decreased fine motor coordination of left hand. Swelling of left hand noted. Strength 4/5 LUE, 5/5 RUE. Strength  5/5 BLE. Gait slow and steady with use of cane. Neurological: Alert and oriented. Coordination normal.  Psychiatric: Mood and affect normal. Behavior is normal. Judgment and thought content normal.    BMET    Component Value Date/Time   NA 141 12/19/2016 0406   K 3.5 12/19/2016 0406   CL 108 12/19/2016 0406   CO2 26 12/19/2016 0406   GLUCOSE 100 (H) 12/19/2016 0406   BUN 13 12/19/2016 0406   CREATININE 0.94 12/19/2016 0406   CALCIUM 8.9 12/19/2016 0406   GFRNONAA >60 12/19/2016 0406   GFRAA >60 12/19/2016 0406    Lipid Panel     Component Value Date/Time   CHOL 193 11/26/2016 0420   TRIG 197 (H) 11/26/2016 0420   HDL 57 11/26/2016 0420   CHOLHDL 3.4 11/26/2016 0420   VLDL 39 11/26/2016 0420   LDLCALC 97 11/26/2016 0420    CBC    Component Value Date/Time   WBC  3.3 (L) 12/19/2016 0406   RBC 4.44 12/19/2016 0406   HGB 13.5 12/19/2016 0406   HCT 42.1 12/19/2016 0406   PLT 297 12/19/2016 0406   MCV 94.8 12/19/2016 0406   MCH 30.4 12/19/2016 0406   MCHC 32.1 12/19/2016 0406   RDW 13.8 12/19/2016 0406   LYMPHSABS 1.3 11/26/2016 0420   MONOABS 0.2 11/26/2016 0420   EOSABS 0.1 11/26/2016 0420   BASOSABS 0.0 11/26/2016 0420    Hgb A1C Lab Results  Component Value Date   HGBA1C 5.4 11/26/2016      Assessment and Plan:   Medicare Annual Wellness Visit:  Diet: He does eat meat. He consumes fruits and veggies. He tries to avoid fried foods. He drinks only water. Physical activity: None outside PT/OT. Depression/mood screen: Negative Hearing: Intact to whispered voice Visual acuity: Grossly normal ADLs: Capable Fall risk: High due to impaired gait. Using cane for assistance.  Home safety: Good Cognitive evaluation: Intact to orientation, naming, recall and repetition EOL planning: No adv directives, full code/ I agree  Preventative Medicine: Flu, tetanus, pneumovax, prevnar, zostovax and shingrix UTD. PSA today. Colon screening due 2020. Encouraged him to  consume a balanced diet and exercise regimen. Advised him to see an eye doctor and dentist annually. Will check CBC, CMET, Lipid profile today.   Next appointment: 1 year, sooner if needed    Nicki ReaperBAITY, Arthor Gorter, NP

## 2017-05-28 NOTE — Assessment & Plan Note (Signed)
Continue Brillinta, ASA, Amlodipine and Atorvastatin CBC, CMET and Lipid profile today Encouraged him to consume a low fat diet He will continue to follow with neurology

## 2017-05-28 NOTE — Assessment & Plan Note (Signed)
CMET and lipid profile today Continue Atorvastatin Encouraged him to consume a low fat diet

## 2017-05-28 NOTE — Assessment & Plan Note (Signed)
Borderline here but getting lower readings at home Continue Amlodipine Reinforced DASH diet and exercise for weight loss

## 2017-05-29 ENCOUNTER — Ambulatory Visit: Payer: Medicare Other | Admitting: Physical Therapy

## 2017-05-29 ENCOUNTER — Ambulatory Visit: Payer: Medicare Other | Admitting: Occupational Therapy

## 2017-05-29 ENCOUNTER — Encounter: Payer: Self-pay | Admitting: Physical Therapy

## 2017-05-29 ENCOUNTER — Encounter: Payer: Self-pay | Admitting: Occupational Therapy

## 2017-05-29 DIAGNOSIS — M6281 Muscle weakness (generalized): Secondary | ICD-10-CM | POA: Diagnosis not present

## 2017-05-29 DIAGNOSIS — R278 Other lack of coordination: Secondary | ICD-10-CM

## 2017-05-29 DIAGNOSIS — R2681 Unsteadiness on feet: Secondary | ICD-10-CM

## 2017-05-29 NOTE — Therapy (Signed)
[facility] MAIN [facility] SERVICES [address] [place], [place], [address] Phone: [phone]   Fax:  [phone]  Physical Therapy Treatment  Patient Details  Name: Jason Nolan MRN: [id number] Date of Birth: Feb 23, 1946 Referring Provider: [name]   Encounter Date: 05/29/2017  PT End of Session - 05/29/17 1440    Visit Number  27    Number of Visits  46    Date for PT Re-Evaluation  06/16/17    PT Start Time  0230    PT Stop Time  0315    PT Time Calculation (min)  45 min    Equipment Utilized During Treatment  Gait belt    Activity Tolerance  Patient tolerated treatment well    Behavior During Therapy  [facility] for tasks assessed/performed       Past Medical History:  Diagnosis Date  . Glaucoma   . Hepatitis C   . Stroke [facility])     Past Surgical History:  Procedure Laterality Date  . IR ANGIO INTRA EXTRACRAN SEL COM CAROTID INNOMINATE UNI L MOD SED  11/25/2016  . IR ANGIO VERTEBRAL SEL SUBCLAVIAN INNOMINATE UNI R MOD SED  11/25/2016  . IR ANGIO VERTEBRAL SEL VERTEBRAL UNI L MOD SED  11/25/2016  . IR INTRAVSC STENT CERV CAROTID W/O EMB-PROT MOD SED INC ANGIO  11/25/2016  . IR PERCUTANEOUS ART THROMBECTOMY/INFUSION INTRACRANIAL INC DIAG ANGIO  11/25/2016  . IR RADIOLOGIST EVAL & MGMT  02/13/2017  . RADIOLOGY WITH ANESTHESIA N/A 11/25/2016   Procedure: RADIOLOGY WITH ANESTHESIA;  Surgeon: Radiologist, Medication, MD;  Location: [facility] OR;  Service: Radiology;  Laterality: N/A;    There were no vitals filed for this visit.  Subjective Assessment - 05/29/17 1428    Subjective  Patinet is doing fine today.No new concerns. Patient is doing his HEP; walking and doing his theraband exercises.    Pertinent History  Pt. is a 72 y.o. 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 Pt. was transferred to [facility]. He underwent CTA brain showed short segment near occlusion of proximal R-ICA with associated intraluminal  thrombus --likely due to dissection and emergent large vessel occlusion with right M2 occlusion and evolving right frontal lobe infarct.  He underwent cerebral angio with  complete revascularization of occluded superior division of R-MCA and near complete revascularization of proximal R-ICA with stent assisted angioplasty.  Stroke felt to be embolic in setting of ruptured plaque R-ICA and he was placed on ASA and brillinta due to stent. Pt. was transferred to inpatient rehab for several weeks, had home health therapy upon discharge.    Limitations  Walking;Lifting;House hold activities    How long can you sit comfortably?  unlimited     How long can you stand comfortably?  unlimited     How long can you walk comfortably?  unsure    Patient Stated Goals  "strengthen my whole L side"       NMR: Matrix with red mat, 6 inch step, stepping stone under the red mat and 22. 5 lbs x 5 fwd, 5 bwd, 2 side to side  BAPS board CW, CCW, middle size center x 20   Foam single leg and cone stack on floor x 12 x 2  Tilt board and cone stack on floor x 12 x 2   TM elevation 2, speed 1. 4 miles / hour x 10 mins  Patient is fatigued and needs CGA and min verbal cues during  balance challenges                        PT Education - 05/29/17 1438    Education provided  Yes    Education Details  HEP    Person(s) Educated  Patient    Methods  Explanation    Comprehension  Verbalized understanding       PT Short Term Goals - 04/23/17 1442      PT SHORT TERM GOAL #1   Title  Pt will improve 5x sit to stand to 18 seconds in order to demonstrate improved LE strength and improved dynamic balance.    Baseline  21 sec; 02/26/17: 16.1s    Time  6    Period  Weeks    Status  Achieved      PT SHORT TERM GOAL #2   Title  Pt will improve gait speed to at least 1.0 m/s with proper heel strike and decreased hip hike in order to demonstrate imrpoved LE strength and balance.    Baseline  0.83  m/s; 02/26/17: 1.11 m/s    Time  6    Period  Weeks    Status  Achieved      PT SHORT TERM GOAL #3   Title  Pt will improve Berg Balance score to 51/56 in order to demonstrate improved dynamic and static balance, decreasing overall falls risk.    Baseline  48/56; 02/26/17: 53/56    Time  6    Period  Weeks    Status  Achieved        PT Long Term Goals - 05/26/17 1528      PT LONG TERM GOAL #1   Title  Pt will demonstrate independence with HEP in order to continue LE strengthening and manage symptoms.    Time  12    Period  Weeks    Status  On-going    Target Date  06/16/17      PT LONG TERM GOAL #2   Title  Pt will improve 5x sit to stand to <15 seconds in order to demonstrate improved LE strength and dynamic balance.    Baseline  21 sec; 02/26/17: 16.1s 16.50 sec 04/23/17; 16 sec 05/26/17    Time  12    Period  Weeks    Status  On-going    Target Date  06/16/17      PT LONG TERM GOAL #3   Title  Pt will demonstrate gait speed of at least 1.2 m/s in order to demonstrate improved LE strength and improved balance in order to return to prior level of funciton.    Baseline  0.833 m/s 02/26/17: 1.11 m/s, 12/189/18 1.16 m/sec; 1.23 m/sec    Time  12    Period  Weeks    Status  On-going    Target Date  06/16/17      PT LONG TERM GOAL #4   Title  Pt will improve Berg Balance score to at least 54/56 in order demonstrate improved dynamic and static balance, decreasing overall falls risk.    Baseline  48/56; 02/26/17: 53/56; 04/23/17  53/56    Time  12    Period  Weeks    Status  On-going      PT LONG TERM GOAL #5   Title  Pt will demonstrate improved LE strength to 4+/5 in all limited planes in order to return to prior level of function.    Baseline  gross strength: 4/5; 02/26/17: deferred strength testing    Time  12    Period  Weeks    Status  On-going    Target Date  06/16/17      PT LONG TERM GOAL #6   Title  Pt will improve to >1500 feet without using AD in  order to return to prior level of function, demonstrating improvements in LE strength, balance and endurance.    Baseline  1205 ft without AD; 02/26/17: deferred to next session 04/23/17 1420 ft    Time  12    Period  Weeks    Status  On-going            Plan - 05/29/17 1442    Clinical Impression Statement  Pt requires redirection and verbal cues for correct performance of exercises. Patient demonstrates LOB with standing balance exercises indicating decreased balancing strategies. Pt was able to progress dynamic balance exercises today, noting improved postural reactions with LOB and moving outside normal BOS.  Pt was able to perform all exercises on uneven surfaces with min assist. Patient struggles with speed during movement as well as balance with unstable surfaces. Pt encouraged to continue HEP .Follow-up as scheduled.    Rehab Potential  Good    Clinical Impairments Affecting Rehab Potential  high prior level of function    PT Frequency  2x / week    PT Duration  12 weeks    PT Treatment/Interventions  ADLs/Self Care Home Management;Cryotherapy;Moist Heat;Gait training;Stair training;Functional mobility training;Therapeutic activities;Therapeutic exercise;Balance training;Neuromuscular re-education;Patient/family education;Manual techniques    PT Next Visit Plan  , MMT, update those specific goals, continue strength/balance;    PT Home Exercise Plan  Add green theraband to HEP exercises    Consulted and Agree with Plan of Care  Patient       Patient will benefit from skilled therapeutic intervention in order to improve the following deficits and impairments:  Abnormal gait, Decreased balance, Decreased strength, Difficulty walking, Increased edema  Visit Diagnosis: Muscle weakness (generalized)  Other lack of coordination  Unsteadiness on feet     Problem List Patient Active Problem List   Diagnosis Date Noted  . Pure hypercholesterolemia 05/28/2017  . Erectile  dysfunction 05/28/2017  . Left hemiparesis (HCC)   . Essential hypertension 11/29/2016  . 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 Hepatitis C 07/22/2013    Jones Broom S,PT DPT 05/29/2017, 3:45 PM  [place] [facility] MAIN [facility] SERVICES [address] [place], [place], [address] Phone: [phone]   Fax:  [phone]  Name: Dekendrick Uzelac MRN: [id number] Date of Birth: [birth year]

## 2017-05-29 NOTE — Therapy (Signed)
[place] [facility] MAIN [facility] SERVICES [address] [place], [place], [address] Phone: [phone]   Fax:  [phone]  Occupational Therapy Treatment  Patient Details  Name: Jason Nolan MRN: [id number] Date of Birth: [birth year] Referring Provider: [name]   Encounter Date: 05/29/2017  OT End of Session - 05/29/17 1536    Visit Number  23    Number of Visits  48    Date for OT Re-Evaluation  07/09/17    OT Start Time  1515    OT Stop Time  1600    OT Time Calculation (min)  45 min    Activity Tolerance  Patient tolerated treatment well    Behavior During Therapy  [facility] for tasks assessed/performed       Past Medical History:  Diagnosis Date  . Glaucoma   . Hepatitis C   . Stroke [facility])     Past Surgical History:  Procedure Laterality Date  . IR ANGIO INTRA EXTRACRAN SEL COM CAROTID INNOMINATE UNI L MOD SED  11/25/2016  . IR ANGIO VERTEBRAL SEL SUBCLAVIAN INNOMINATE UNI R MOD SED  11/25/2016  . IR ANGIO VERTEBRAL SEL VERTEBRAL UNI L MOD SED  11/25/2016  . IR INTRAVSC STENT CERV CAROTID W/O EMB-PROT MOD SED INC ANGIO  11/25/2016  . IR PERCUTANEOUS ART THROMBECTOMY/INFUSION INTRACRANIAL INC DIAG ANGIO  11/25/2016  . IR RADIOLOGIST EVAL & MGMT  02/13/2017  . RADIOLOGY WITH ANESTHESIA N/A 11/25/2016   Procedure: RADIOLOGY WITH ANESTHESIA;  Surgeon: Radiologist, Medication, MD;  Location: [facility] OR;  Service: Radiology;  Laterality: N/A;    There were no vitals filed for this visit.  Subjective Assessment - 05/29/17 1530    Subjective   Pt. is preparing to visit a friend in [place].    Patient is accompained by:  Family member    Pertinent History  Pt. is a 72 y.o. 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 Pt. was transferred to [facility]. He underwent CTA brain showed short segment near occlusion of proximal R-ICA with associated intraluminal thrombus --likely due to dissection and emergent large vessel occlusion with right M2  occlusion and evolving right frontal lobe infarct.  He underwent cerebral angio with  complete revascularization of occluded superior division of R-MCA and near complete revascularization of proximal R-ICA with stent assisted angioplasty.  Stroke felt to be embolic in setting of ruptured plaque R-ICA and he was placed on ASA and brillinta due to stent. Pt. was transferred to inpatient rehab for several weeks, had home health therapy upon discharge, and now is ready for outpatient therapy services.    Limitations  Dominant LUE functioning, left hand edema, distal ROM, coordination, and overal strength.    Currently in Pain?  No/denies       OT TREATMENT    Neuro muscular re-education:  Pt. Worked on isolating his left 2nd digit, and grasping small pegs. Pt. Worked on sliding the peg off a surface with the 2nd digit to the thumb, then to the palm .  Therapeutic Exercise:  Pt. Worked on the [employer] for 8 min. With constant monitoring of the BUEs. Pt. Worked on changing, and alternating forward reverse position every 2 min. Rest breaks were required. Pt. performed gross gripping with grip strengthener. Pt. worked on sustaining grip while grasping pegs and reaching at various heights. Gripper was placed in the 3rdresistive slot with the white resistive spring. Pt. Worked on pinch strengthening in the left hand for lateral, and 3pt.  pinch using yellow, red,  And green resistive clips. Pt. worked on placing the clips at various vertical and horizontal angles. Tactile and verbal cues were required for eliciting the desired movement.                          OT Education - 05/29/17 1534    Education provided  Yes    Education Details  strength, [facility]    Person(s) Educated  Patient    Methods  Explanation    Comprehension  Verbalized understanding          OT Long Term Goals - 04/16/17 1633      OT LONG TERM GOAL #1   Title  Pt. will improve edema by 2 cm through MPs in  preparation for ROM.    Baseline  improving    Time  12    Period  Weeks    Status  On-going    Target Date  07/09/17      OT LONG TERM GOAL #2   Title  Pt. will improve Left hand digit extension in preparation for weightbearing, and releasing objects during ADLs/IADLs.    Baseline  Pt. is unable to achieve full digit extension on the left.    Time  12    Period  Weeks    Status  On-going    Target Date  07/09/17      OT LONG TERM GOAL #3   Title  Pt. left hand grip with  increase by 5# in preparation for holding a cup.    Baseline  Left 2#, RIght: 60    Time  12    Period  Weeks    Status  On-going    Target Date  07/09/17      OT LONG TERM GOAL #4   Title  Pt. will increased right shoulder strength by 1 mm grade to assist with ADLs.    Baseline  Impaired LUE strength    Time  12    Period  Weeks    Target Date  07/09/17      OT LONG TERM GOAL #5   Title  Pt. will increase left wrist extension in preparation for reaching ADL items.    Baseline  wrist extension 28 degrees    Time  12    Period  Weeks    Status  On-going    Target Date  07/09/17      OT LONG TERM GOAL #6   Title  Pt. will improve left hand coordination skills to be able to button clothing.    Baseline  Pt. conitnues to have difficulty    Time  12    Period  Weeks    Status  On-going    Target Date  07/09/17      OT LONG TERM GOAL #7   Title  Pt. will independently demonstrate visual compensatory strategies duirng ADLs, and IADLs.    Baseline  Glaucoma    Time  12    Period  Weeks    Status  On-going    Target Date  07/09/17            Plan - 05/29/17 1539    Clinical Impression Statement  Pt. had an MD appointment yesterday. Pt. reports having more headaches lately. Pt. reports he has stopped taking his night time aspirin.  Pt. Has progressed to lateral pinch with the green resistive clips. Pt. continues to work on  improving LUE strength, and coordination for improved functional use during  ADLs, and IADLs.   Occupational performance deficits (Please refer to evaluation for details):  ADL's;IADL's    Rehab Potential  Excellent    OT Frequency  2x / week    OT Duration  12 weeks    OT Treatment/Interventions  Self-care/ADL training;Therapeutic exercise;Neuromuscular education;Patient/family education;Energy conservation;Therapeutic activities;DME and/or AE instruction    Clinical Decision Making  Multiple treatment options, significant modification of task necessary    Consulted and Agree with Plan of Care  Patient       Patient will benefit from skilled therapeutic intervention in order to improve the following deficits and impairments:  Abnormal gait, Pain, Decreased strength, Decreased range of motion, Impaired UE functional use, Decreased coordination, Decreased knowledge of use of DME, Decreased activity tolerance, Difficulty walking, Decreased balance, Other (comment)  Visit Diagnosis: Muscle weakness (generalized)  Other lack of coordination    Problem List Patient Active Problem List   Diagnosis Date Noted  . Pure hypercholesterolemia 05/28/2017  . Erectile dysfunction 05/28/2017  . Left hemiparesis (HCC)   . Essential hypertension 11/29/2016  . 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 Hepatitis C 07/22/2013    Olegario MessierElaine Vendela Troung, MS, OTR/L 05/29/2017, 3:54 PM  [place] [facility] [address] [place], [place], [address] Phone: [phone]   Fax:  [phone]  Name: Jason Nolan MRN: [id number] Date of Birth: 04/08/1946

## 2017-05-30 ENCOUNTER — Encounter: Payer: Self-pay | Admitting: Neurology

## 2017-06-03 ENCOUNTER — Ambulatory Visit: Payer: Medicare Other | Admitting: Occupational Therapy

## 2017-06-03 ENCOUNTER — Encounter: Payer: Self-pay | Admitting: Physical Therapy

## 2017-06-03 ENCOUNTER — Ambulatory Visit: Payer: Medicare Other | Admitting: Physical Therapy

## 2017-06-03 ENCOUNTER — Encounter: Payer: Self-pay | Admitting: Occupational Therapy

## 2017-06-03 DIAGNOSIS — R278 Other lack of coordination: Secondary | ICD-10-CM | POA: Diagnosis not present

## 2017-06-03 DIAGNOSIS — M6281 Muscle weakness (generalized): Secondary | ICD-10-CM | POA: Diagnosis not present

## 2017-06-03 DIAGNOSIS — R2681 Unsteadiness on feet: Secondary | ICD-10-CM

## 2017-06-03 NOTE — Therapy (Signed)
[place] [facility] MAIN [facility] SERVICES [address] [place], [place], [address] Phone: [phone]   Fax:  [phone]  Physical Therapy Treatment  Patient Details  Name: Jason Nolan MRN: [id number] Date of Birth: May 22, 1945 Referring Provider: [name]   Encounter Date: 06/03/2017  PT End of Session - 06/03/17 1357    Visit Number  28    Number of Visits  46    Date for PT Re-Evaluation  06/16/17    PT Start Time  0145    PT Stop Time  0230    PT Time Calculation (min)  45 min    Equipment Utilized During Treatment  Gait belt    Activity Tolerance  Patient tolerated treatment well    Behavior During Therapy  [facility] for tasks assessed/performed       Past Medical History:  Diagnosis Date  . Glaucoma   . Hepatitis C   . Stroke [facility])     Past Surgical History:  Procedure Laterality Date  . IR ANGIO INTRA EXTRACRAN SEL COM CAROTID INNOMINATE UNI L MOD SED  11/25/2016  . IR ANGIO VERTEBRAL SEL SUBCLAVIAN INNOMINATE UNI R MOD SED  11/25/2016  . IR ANGIO VERTEBRAL SEL VERTEBRAL UNI L MOD SED  11/25/2016  . IR INTRAVSC STENT CERV CAROTID W/O EMB-PROT MOD SED INC ANGIO  11/25/2016  . IR PERCUTANEOUS ART THROMBECTOMY/INFUSION INTRACRANIAL INC DIAG ANGIO  11/25/2016  . IR RADIOLOGIST EVAL & MGMT  02/13/2017  . RADIOLOGY WITH ANESTHESIA N/A 11/25/2016   Procedure: RADIOLOGY WITH ANESTHESIA;  Surgeon: Radiologist, Medication, MD;  Location: [facility] OR;  Service: Radiology;  Laterality: N/A;    There were no vitals filed for this visit.  Subjective Assessment - 06/03/17 1351    Subjective  Patinet is doing fine today.No new concerns. Patient is doing his HEP; walking and doing his theraband exercises.    Pertinent History  Pt. is a 72 y.o. 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 Pt. was transferred to [facility]. He underwent CTA brain showed short segment near occlusion of proximal R-ICA with associated intraluminal  thrombus --likely due to dissection and emergent large vessel occlusion with right M2 occlusion and evolving right frontal lobe infarct.  He underwent cerebral angio with  complete revascularization of occluded superior division of R-MCA and near complete revascularization of proximal R-ICA with stent assisted angioplasty.  Stroke felt to be embolic in setting of ruptured plaque R-ICA and he was placed on ASA and brillinta due to stent. Pt. was transferred to inpatient rehab for several weeks, had home health therapy upon discharge.    Limitations  Walking;Lifting;House hold activities    How long can you sit comfortably?  unlimited     How long can you stand comfortably?  unlimited     How long can you walk comfortably?  unsure    Patient Stated Goals  "strengthen my whole L side"    Currently in Pain?  No/denies    Pain Score  0-No pain      NMR:  Squats on BOSU ball flat side and round side x 10 x 2  Tandem stand on disk and left and right disk x 3 mins, shifting weight fwd/ bwd  Rocker board fwd/ bwd and side to side x 3 mins   Matrix fwd/ bwd x 5 and side stepping left and right x 10 ,  17. 5 lbs  Single leg stand with ball rotation left and right  CGA and Min to mod verbal cues used throughout with increased in postural sway and LOB most seen with narrow base of support and while on uneven surfaces. Continues to have balance deficits typical with diagnosis. Patient performs intermediate level exercises without pain behaviors and needs verbal cuing for postural alignment and head positioning                           PT Education - 06/03/17 1352    Education Details  balance and safety    Person(s) Educated  Patient    Methods  Explanation;Demonstration    Comprehension  Verbalized understanding;Returned demonstration;Verbal cues required;Tactile cues required       PT Short Term Goals - 04/23/17 1442      PT SHORT TERM GOAL #1   Title  Pt will  improve 5x sit to stand to 18 seconds in order to demonstrate improved LE strength and improved dynamic balance.    Baseline  21 sec; 02/26/17: 16.1s    Time  6    Period  Weeks    Status  Achieved      PT SHORT TERM GOAL #2   Title  Pt will improve gait speed to at least 1.0 m/s with proper heel strike and decreased hip hike in order to demonstrate imrpoved LE strength and balance.    Baseline  0.83 m/s; 02/26/17: 1.11 m/s    Time  6    Period  Weeks    Status  Achieved      PT SHORT TERM GOAL #3   Title  Pt will improve Berg Balance score to 51/56 in order to demonstrate improved dynamic and static balance, decreasing overall falls risk.    Baseline  48/56; 02/26/17: 53/56    Time  6    Period  Weeks    Status  Achieved        PT Long Term Goals - 05/26/17 1528      PT LONG TERM GOAL #1   Title  Pt will demonstrate independence with HEP in order to continue LE strengthening and manage symptoms.    Time  12    Period  Weeks    Status  On-going    Target Date  06/16/17      PT LONG TERM GOAL #2   Title  Pt will improve 5x sit to stand to <15 seconds in order to demonstrate improved LE strength and dynamic balance.    Baseline  21 sec; 02/26/17: 16.1s 16.50 sec 04/23/17; 16 sec 05/26/17    Time  12    Period  Weeks    Status  On-going    Target Date  06/16/17      PT LONG TERM GOAL #3   Title  Pt will demonstrate gait speed of at least 1.2 m/s in order to demonstrate improved LE strength and improved balance in order to return to prior level of funciton.    Baseline  0.833 m/s 02/26/17: 1.11 m/s, 12/189/18 1.16 m/sec; 1.23 m/sec    Time  12    Period  Weeks    Status  On-going    Target Date  06/16/17      PT LONG TERM GOAL #4   Title  Pt will improve Berg Balance score to at least 54/56 in order demonstrate improved dynamic and static balance, decreasing overall falls risk.    Baseline  48/56; 02/26/17: 16/10; 04/23/17  53/56  Time  12    Period  Weeks    Status   On-going      PT LONG TERM GOAL #5   Title  Pt will demonstrate improved LE strength to 4+/5 in all limited planes in order to return to prior level of function.    Baseline  gross strength: 4/5; 02/26/17: deferred strength testing    Time  12    Period  Weeks    Status  On-going    Target Date  06/16/17      PT LONG TERM GOAL #6   Title  Pt will improve 6MWT to >1500 feet without using AD in order to return to prior level of function, demonstrating improvements in LE strength, balance and endurance.    Baseline  1205 ft without AD; 02/26/17: deferred to next session 04/23/17 1420 ft    Time  12    Period  Weeks    Status  On-going            Plan - 06/03/17 1653    Clinical Impression Statement  Patient demonstrates LOB with standing balance exercises indicating decreased balancing strategies. Patient did not require UE support for balance disk, and tandem stand, and rocker board with challenging balance exercise. Patient will benefit from further skilled therapy to return to prior level of function.  Pt was encouraged to perform HEP during the week in order to continue progressing balance and strength interventions.  Pt would continue to benefit from skilled therapy services in order to further address LE strength deficits and balance deficits in order to decrease fall risk and improve mobility.    Rehab Potential  Good    Clinical Impairments Affecting Rehab Potential  high prior level of function    PT Frequency  2x / week    PT Duration  12 weeks    PT Treatment/Interventions  ADLs/Self Care Home Management;Cryotherapy;Moist Heat;Gait training;Stair training;Functional mobility training;Therapeutic activities;Therapeutic exercise;Balance training;Neuromuscular re-education;Patient/family education;Manual techniques    PT Next Visit Plan  6MWT, MMT, update those specific goals, continue strength/balance;    PT Home Exercise Plan  Add green theraband to HEP exercises    Consulted  and Agree with Plan of Care  Patient       Patient will benefit from skilled therapeutic intervention in order to improve the following deficits and impairments:  Abnormal gait, Decreased balance, Decreased strength, Difficulty walking, Increased edema  Visit Diagnosis: Muscle weakness (generalized)  Other lack of coordination  Unsteadiness on feet     Problem List Patient Active Problem List   Diagnosis Date Noted  . Pure hypercholesterolemia 05/28/2017  . Erectile dysfunction 05/28/2017  . Left hemiparesis (HCC)   . Essential hypertension 11/29/2016  . 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 Hepatitis C 07/22/2013    Ezekiel InaMansfield, Arshia Rondon S, [place]PT DPT 06/03/2017, 4:58 PM  [place] [facility] [address] [place], [place], [address] Phone: [phone]   Fax:  [phone]  Name: Jason CroakJames Nolan MRN: [id number] Date of Birth: 1945-06-18

## 2017-06-03 NOTE — Therapy (Signed)
[place] [facility] MAIN [facility] SERVICES [address] [place], [place], [address] Phone: [phone]   Fax:  [phone]  Occupational Therapy Treatment  Patient Details  Name: Jason Nolan MRN: [id number] Date of Birth: December 24, 1945 Referring Provider: [name]   Encounter Date: 06/03/2017  OT End of Session - 06/03/17 1452    Visit Number  24    Number of Visits  48    Date for OT Re-Evaluation  07/09/17    OT Start Time  1430    OT Stop Time  1515    OT Time Calculation (min)  45 min    Activity Tolerance  Patient tolerated treatment well    Behavior During Therapy  [facility] for tasks assessed/performed       Past Medical History:  Diagnosis Date  . Glaucoma   . Hepatitis C   . Stroke [facility])     Past Surgical History:  Procedure Laterality Date  . IR ANGIO INTRA EXTRACRAN SEL COM CAROTID INNOMINATE UNI L MOD SED  11/25/2016  . IR ANGIO VERTEBRAL SEL SUBCLAVIAN INNOMINATE UNI R MOD SED  11/25/2016  . IR ANGIO VERTEBRAL SEL VERTEBRAL UNI L MOD SED  11/25/2016  . IR INTRAVSC STENT CERV CAROTID W/O EMB-PROT MOD SED INC ANGIO  11/25/2016  . IR PERCUTANEOUS ART THROMBECTOMY/INFUSION INTRACRANIAL INC DIAG ANGIO  11/25/2016  . IR RADIOLOGIST EVAL & MGMT  02/13/2017  . RADIOLOGY WITH ANESTHESIA N/A 11/25/2016   Procedure: RADIOLOGY WITH ANESTHESIA;  Surgeon: Radiologist, Medication, MD;  Location: [facility] OR;  Service: Radiology;  Laterality: N/A;    There were no vitals filed for this visit.  Subjective Assessment - 06/03/17 1704    Subjective   Pt. leaves tomorrow for [place]    Patient is accompained by:  Family member    Pertinent History  Pt. is a 72 y.o. 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 Pt. was [facility] to [name]. He underwent CTA brain showed short segment near occlusion of proximal R-ICA with associated intraluminal thrombus --likely due to dissection and emergent large vessel occlusion with right M2 occlusion and  evolving right frontal lobe infarct.  He underwent cerebral angio with  complete revascularization of occluded superior division of R-MCA and near complete revascularization of proximal R-ICA with stent assisted angioplasty.  Stroke felt to be embolic in setting of ruptured plaque R-ICA and he was placed on ASA and brillinta due to stent. Pt. was transferred to inpatient rehab for several weeks, had home health therapy upon discharge, and now is ready for outpatient therapy services.    Currently in Pain?  No/denies      OT TREATMENT    Neuro muscular re-education:  Pt. worked on grasping 1" resistive cubes alternating thumb opposition to the tip of the 2nd through 5th digits while the board is placed at a vertical angle. Pt. worked on pressing the cubes back into place while alternating isolated 2nd digit with his thumb.  Therapeutic Exercise:  Pt. Worked on the [employer] for 8 min. With constant monitoring of the BUEs. Pt. Worked on changing, and alternating forward reverse position every 2 min. Rest breaks were required. Pt. performed gross gripping with grip strengthener. Pt. worked on sustaining grip while grasping pegs and reaching at various heights. Gripper was placed in the 3rd resistive slot with the white resistive spring. Pt. Worked on pinch strengthening in the left hand for lateral, and 3pt. pinch using yellow, red, and green. Pt. worked on  placing the clips at various vertical and horizontal angles. Tactile and verbal cues were required for eliciting the desired movement. Pt. Required visual demonstration for proper technique.                         OT Education - 06/03/17 1452    Education provided  Yes    Education Details  FMC, UE strength    Person(s) Educated  Patient    Methods  Explanation;Demonstration    Comprehension  Verbalized understanding;Returned demonstration;Verbal cues required;Tactile cues required          OT Long Term Goals - 04/16/17  1633      OT LONG TERM GOAL #1   Title  Pt. will improve edema by 2 cm through MPs in preparation for ROM.    Baseline  improving    Time  12    Period  Weeks    Status  On-going    Target Date  07/09/17      OT LONG TERM GOAL #2   Title  Pt. will improve Left hand digit extension in preparation for weightbearing, and releasing objects during ADLs/IADLs.    Baseline  Pt. is unable to achieve full digit extension on the left.    Time  12    Period  Weeks    Status  On-going    Target Date  07/09/17      OT LONG TERM GOAL #3   Title  Pt. left hand grip with  increase by 5# in preparation for holding a cup.    Baseline  Left 2#, RIght: 60    Time  12    Period  Weeks    Status  On-going    Target Date  07/09/17      OT LONG TERM GOAL #4   Title  Pt. will increased right shoulder strength by 1 mm grade to assist with ADLs.    Baseline  Impaired LUE strength    Time  12    Period  Weeks    Target Date  07/09/17      OT LONG TERM GOAL #5   Title  Pt. will increase left wrist extension in preparation for reaching ADL items.    Baseline  wrist extension 28 degrees    Time  12    Period  Weeks    Status  On-going    Target Date  07/09/17      OT LONG TERM GOAL #6   Title  Pt. will improve left hand coordination skills to be able to button clothing.    Baseline  Pt. conitnues to have difficulty    Time  12    Period  Weeks    Status  On-going    Target Date  07/09/17      OT LONG TERM GOAL #7   Title  Pt. will independently demonstrate visual compensatory strategies duirng ADLs, and IADLs.    Baseline  Glaucoma    Time  12    Period  Weeks    Status  On-going    Target Date  07/09/17            Plan - 06/03/17 1452    Clinical Impression Statement  Pt. is preparing for his [place] to [place] tomorrow. Pt. reports he has wisdom teeth that need to be pulled. Pt. has to meet with an oral surgeon next month on Feb. 18th. Pt. continues to work on improving  LUE strength,  and coordination skills with emephasis on the development of [facility] skills Pt. education was provided about engaging hios Left hand during ADL, and IADL tasks at home..    Occupational performance deficits (Please refer to evaluation for details):  ADL's;IADL's    Rehab Potential  Excellent    OT Frequency  2x / week    OT Duration  12 weeks    OT Treatment/Interventions  Self-care/ADL training;Therapeutic exercise;Neuromuscular education;Patient/family education;Energy conservation;Therapeutic activities;DME and/or AE instruction    Clinical Decision Making  Multiple treatment options, significant modification of task necessary    Consulted and Agree with Plan of Care  Patient       Patient will benefit from skilled therapeutic intervention in order to improve the following deficits and impairments:  Abnormal gait, Pain, Decreased strength, Decreased range of motion, Impaired UE functional use, Decreased coordination, Decreased knowledge of use of DME, Decreased activity tolerance, Difficulty walking, Decreased balance, Other (comment)  Visit Diagnosis: Muscle weakness (generalized)  Other lack of coordination    Problem List Patient Active Problem List   Diagnosis Date Noted  . Pure hypercholesterolemia 05/28/2017  . Erectile dysfunction 05/28/2017  . Left hemiparesis (HCC)   . Essential hypertension 11/29/2016  . 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 Hepatitis C 07/22/2013    Olegario Messier, MS, OTR/L 06/03/2017, 5:10 PM  [facility] MAIN [facility] SERVICES [address] [place], [place], [address] Phone: [phone]   Fax:  [phone]  Name: Jason Nolan MRN: [id number] Date of Birth: 14-Jan-1946

## 2017-06-05 ENCOUNTER — Ambulatory Visit: Payer: Medicare Other | Admitting: Physical Therapy

## 2017-06-05 ENCOUNTER — Encounter: Payer: Medicare Other | Admitting: Occupational Therapy

## 2017-06-05 ENCOUNTER — Other Ambulatory Visit: Payer: Self-pay | Admitting: Internal Medicine

## 2017-06-05 DIAGNOSIS — D72819 Decreased white blood cell count, unspecified: Secondary | ICD-10-CM

## 2017-06-09 ENCOUNTER — Ambulatory Visit: Payer: Medicare Other | Admitting: Physical Therapy

## 2017-06-11 ENCOUNTER — Ambulatory Visit: Payer: Medicare Other | Admitting: Physical Therapy

## 2017-06-16 ENCOUNTER — Encounter: Payer: Self-pay | Admitting: Physical Therapy

## 2017-06-16 ENCOUNTER — Ambulatory Visit: Payer: Medicare Other | Admitting: Occupational Therapy

## 2017-06-16 ENCOUNTER — Encounter: Payer: Self-pay | Admitting: Occupational Therapy

## 2017-06-16 ENCOUNTER — Ambulatory Visit: Payer: Medicare Other | Attending: Internal Medicine | Admitting: Physical Therapy

## 2017-06-16 DIAGNOSIS — R278 Other lack of coordination: Secondary | ICD-10-CM | POA: Diagnosis not present

## 2017-06-16 DIAGNOSIS — R2681 Unsteadiness on feet: Secondary | ICD-10-CM | POA: Diagnosis not present

## 2017-06-16 DIAGNOSIS — I639 Cerebral infarction, unspecified: Secondary | ICD-10-CM | POA: Insufficient documentation

## 2017-06-16 DIAGNOSIS — M6281 Muscle weakness (generalized): Secondary | ICD-10-CM | POA: Diagnosis not present

## 2017-06-16 NOTE — Therapy (Signed)
[place] [facility] MAIN [facility] SERVICES [address] [place], [place], [address] Phone: [phone]   Fax:  [phone]  Physical Therapy Treatment  Patient Details  Name: Jason Nolan MRN: [id number] Date of Birth: Jan 26, 1946 Referring Provider: [name]   Encounter Date: 06/16/2017  PT End of Session - 06/16/17 1308    Visit Number  28    Number of Visits  63   Date for PT Re-Evaluation  08/11/17    PT Start Time  0100    PT Stop Time  0145    PT Time Calculation (min)  45 min    Equipment Utilized During Treatment  Gait belt    Activity Tolerance  Patient tolerated treatment well    Behavior During Therapy  [facility] for tasks assessed/performed       Past Medical History:  Diagnosis Date  . Glaucoma   . Hepatitis C   . Stroke [facility])     Past Surgical History:  Procedure Laterality Date  . IR ANGIO INTRA EXTRACRAN SEL COM CAROTID INNOMINATE UNI L MOD SED  11/25/2016  . IR ANGIO VERTEBRAL SEL SUBCLAVIAN INNOMINATE UNI R MOD SED  11/25/2016  . IR ANGIO VERTEBRAL SEL VERTEBRAL UNI L MOD SED  11/25/2016  . IR INTRAVSC STENT CERV CAROTID W/O EMB-PROT MOD SED INC ANGIO  11/25/2016  . IR PERCUTANEOUS ART THROMBECTOMY/INFUSION INTRACRANIAL INC DIAG ANGIO  11/25/2016  . IR RADIOLOGIST EVAL & MGMT  02/13/2017  . RADIOLOGY WITH ANESTHESIA N/A 11/25/2016   Procedure: RADIOLOGY WITH ANESTHESIA;  Surgeon: Radiologist, Medication, MD;  Location: [facility] OR;  Service: Radiology;  Laterality: N/A;    There were no vitals filed for this visit.  Subjective Assessment - 06/16/17 1307    Subjective  Patinet is doing fine today.No new concerns. Patient is doing his HEP; walking and doing his theraband exercises.    Pertinent History  Pt. is a 72 y.o. 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 Pt. was transferred to [facility]. He underwent CTA brain showed short segment near occlusion of proximal R-ICA with associated intraluminal thrombus  --likely due to dissection and emergent large vessel occlusion with right M2 occlusion and evolving right frontal lobe infarct.  He underwent cerebral angio with  complete revascularization of occluded superior division of R-MCA and near complete revascularization of proximal R-ICA with stent assisted angioplasty.  Stroke felt to be embolic in setting of ruptured plaque R-ICA and he was placed on ASA and brillinta due to stent. Pt. was transferred to inpatient rehab for several weeks, had home health therapy upon discharge.    Limitations  Walking;Lifting;House hold activities    How long can you sit comfortably?  unlimited     How long can you stand comfortably?  unlimited     How long can you walk comfortably?  unsure    Patient Stated Goals  "strengthen my whole L side"    Currently in Pain?  No/denies    Pain Score  0-No pain       Out come measures performed and goals reassessed and recommended continuing skilled PT to reach goals  Neuromuscular training; Tandem stand on 1/2 foam flat side up with toe taps from floor to stepping stone x 2 mins and then change leg position  Standing on dina disk with toe taps x 2 mins  Standing  Tandem on 1/2 foam with cone across from left to right side x 6 sets   CGA  and Min to mod verbal cues used throughout with increased in postural sway and LOB most seen with narrow base of support and while on uneven surfaces. Continues to have balance deficits typical with diagnosis. Patient performs advanced level exercises without pain behaviors and needs verbal cuing for postural alignment and head positioning                       PT Education - 06/16/17 1308    Education provided  Yes    Education Details  HEP review    Person(s) Educated  Patient    Methods  Explanation;Demonstration;Verbal cues    Comprehension  Verbalized understanding;Returned demonstration;Verbal cues required       PT Short Term Goals - 04/23/17 1442      PT  SHORT TERM GOAL #1   Title  Pt will improve 5x sit to stand to 18 seconds in order to demonstrate improved LE strength and improved dynamic balance.    Baseline  21 sec; 02/26/17: 16.1s    Time  6    Period  Weeks    Status  Achieved      PT SHORT TERM GOAL #2   Title  Pt will improve gait speed to at least 1.0 m/s with proper heel strike and decreased hip hike in order to demonstrate imrpoved LE strength and balance.    Baseline  0.83 m/s; 02/26/17: 1.11 m/s    Time  6    Period  Weeks    Status  Achieved      PT SHORT TERM GOAL #3   Title  Pt will improve Berg Balance score to 51/56 in order to demonstrate improved dynamic and static balance, decreasing overall falls risk.    Baseline  48/56; 02/26/17: 53/56    Time  6    Period  Weeks    Status  Achieved        PT Long Term Goals - 06/16/17 1308      PT LONG TERM GOAL #1   Title  Pt will demonstrate independence with HEP in order to continue LE strengthening and manage symptoms.    Time  12    Period  Weeks    Status  On-going    Target Date  08/11/17      PT LONG TERM GOAL #2   Title  Pt will improve 5x sit to stand to <15 seconds in order to demonstrate improved LE strength and dynamic balance.    Baseline  21 sec; 02/26/17: 16.1s 16.50 sec 04/23/17; 16 sec 05/26/17, 06/16/17 16 sec    Time  12    Period  Weeks    Status  On-going    Target Date  08/11/17      PT LONG TERM GOAL #3   Title  Pt will demonstrate gait speed of at least 1.2 m/s in order to demonstrate improved LE strength and improved balance in order to return to prior level of funciton.    Baseline  0.833 m/s 02/26/17: 1.11 m/s, 12/189/18 1.16 m/sec; 1.23 m/sec    Time  12    Period  Weeks    Status  Achieved    Target Date  06/16/17      PT LONG TERM GOAL #4   Title  Pt will improve Berg Balance score to at least 54/56 in order demonstrate improved dynamic and static balance, decreasing overall falls risk.    Baseline  48/56; 02/26/17: 53/56;  04/23/17  53/56    Time  12    Period  Weeks    Status  Achieved    Target Date  06/16/17      PT LONG TERM GOAL #5   Title  Pt will demonstrate improved LE strength to 4+/5 in all limited planes in order to return to prior level of function.    Baseline  LLE hip flex 4/5, abd 4+/5, ext 4/5, LLE knee 5/5, ankle 5/5    Time  12    Period  Weeks    Status  On-going    Target Date  08/11/17      Additional Long Term Goals   Additional Long Term Goals  Yes      PT LONG TERM GOAL #6   Title  Pt will improve 6MWT to >1500 feet without using AD in order to return to prior level of function, demonstrating improvements in LE strength, balance and endurance.    Baseline  1205 ft without AD; 02/26/17: deferred to next session 04/23/17 1420 ft, 1525 feet    Time  12    Period  Weeks    Status  On-going    Target Date  08/11/17      PT LONG TERM GOAL #7   Title  Patient will perform TM walking elevated . 3 with increased speed and no loss of balance for 5 mins to be able to walk a golf  course.    Time  12    Period  Weeks    Status  New    Target Date  08/11/17              Patient will benefit from skilled therapeutic intervention in order to improve the following deficits and impairments:     Visit Diagnosis: Muscle weakness (generalized)  Other lack of coordination  Unsteadiness on feet     Problem List Patient Active Problem List   Diagnosis Date Noted  . Pure hypercholesterolemia 05/28/2017  . Erectile dysfunction 05/28/2017  . Left hemiparesis (HCC)   . Essential hypertension 11/29/2016  . 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 Hepatitis C 07/22/2013    Ezekiel InaMansfield, Dawnell Bryant S , [place] DPT 06/16/2017, 1:56 PM  [facility] [address] [place], [place], [address] Phone: [phone]   Fax:   [phone]  Name: Jason CroakJames Nolan MRN: [id number] Date of Birth: Sep 06, 1945

## 2017-06-16 NOTE — Therapy (Signed)
[facility] [address] [place], [place], [address] Phone: [phone]   Fax:  [phone]  Occupational Therapy Treatment  Patient Details  Name: Jason CroakJames Nolan MRN: [id number] Date of Birth: Jun 19, 1945 Referring Provider: Lorre MunroeBAITY, REGINA W   Encounter Date: 06/16/2017  OT End of Session - 06/16/17 1350    Visit Number  25    Number of Visits  48    Date for OT Re-Evaluation  07/09/17    OT Start Time  1345    OT Stop Time  1430    OT Time Calculation (min)  45 min    Activity Tolerance  Patient tolerated treatment well    Behavior During Therapy  [facility] for tasks assessed/performed       Past Medical History:  Diagnosis Date  . Glaucoma   . Hepatitis C   . Stroke [facility](HCC)     Past Surgical History:  Procedure Laterality Date  . IR ANGIO INTRA EXTRACRAN SEL COM CAROTID INNOMINATE UNI L MOD SED  11/25/2016  . IR ANGIO VERTEBRAL SEL SUBCLAVIAN INNOMINATE UNI R MOD SED  11/25/2016  . IR ANGIO VERTEBRAL SEL VERTEBRAL UNI L MOD SED  11/25/2016  . IR INTRAVSC STENT CERV CAROTID W/O EMB-PROT MOD SED INC ANGIO  11/25/2016  . IR PERCUTANEOUS ART THROMBECTOMY/INFUSION INTRACRANIAL INC DIAG ANGIO  11/25/2016  . IR RADIOLOGIST EVAL & MGMT  02/13/2017  . RADIOLOGY WITH ANESTHESIA N/A 11/25/2016   Procedure: RADIOLOGY WITH ANESTHESIA;  Surgeon: Radiologist, Medication, MD;  Location: [facility] OR;  Service: Radiology;  Laterality: N/A;    There were no vitals filed for this visit.  Subjective Assessment - 06/16/17 1349    Subjective   [place] from [place]    Patient is accompained by:  Family member    Pertinent History  Pt. is a 72 y.o. 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 [facility] He underwent CTA brain showed short segment near occlusion of proximal R-ICA with associated intraluminal thrombus --likely due to dissection and emergent large vessel occlusion with right M2 occlusion and evolving  right frontal lobe infarct.  He underwent cerebral angio with  complete revascularization of occluded superior division of R-MCA and near complete revascularization of proximal R-ICA with stent assisted angioplasty.  Stroke felt to be embolic in setting of ruptured plaque R-ICA and he was placed on ASA and brillinta due to stent. Pt. was transferred to inpatient rehab for several weeks, had home health therapy upon discharge, and now is ready for outpatient therapy services.    Currently in Pain?  No/denies      OT TREATMENT    Neuro muscular re-education:  Pt. Worked on grasping, and flipping cards. Pt. Worked on grasping one inch resistive cubes alternating thumb opposition to the tip of the 2nd through 5th digits. The board was positioned at a vertical angle. Pt. Worked on pressing them back into place while isolating 2nd through 5th digits with his left hand.  Therapeutic Exercise:  Pt. performed gross gripping with grip strengthener. Pt. worked on sustaining grip while grasping pegs and reaching at various heights. Gripper was placed in the 3rd resistive slot with the white resistive spring. Pt. Worked on pinch strengthening in the left hand for lateral, and 3pt. pinch using yellow, red, and green resistive clips. Pt. worked on placing the clips at various vertical and horizontal angles. Tactile and verbal cues were required for eliciting the desired movement.  OT Education - 06/16/17 1350    Education provided  Yes    Education Details  UE strength    Person(s) Educated  Patient    Methods  Explanation;Demonstration;Verbal cues    Comprehension  Verbalized understanding;Returned demonstration;Verbal cues required          OT Long Term Goals - 04/16/17 1633      OT LONG TERM GOAL #1   Title  Pt. will improve edema by 2 cm through MPs in preparation for ROM.    Baseline  improving    Time  12    Period  Weeks    Status  On-going     Target Date  07/09/17      OT LONG TERM GOAL #2   Title  Pt. will improve Left hand digit extension in preparation for weightbearing, and releasing objects during ADLs/IADLs.    Baseline  Pt. is unable to achieve full digit extension on the left.    Time  12    Period  Weeks    Status  On-going    Target Date  07/09/17      OT LONG TERM GOAL #3   Title  Pt. left hand grip with  increase by 5# in preparation for holding a cup.    Baseline  Left 2#, RIght: 60    Time  12    Period  Weeks    Status  On-going    Target Date  07/09/17      OT LONG TERM GOAL #4   Title  Pt. will increased right shoulder strength by 1 mm grade to assist with ADLs.    Baseline  Impaired LUE strength    Time  12    Period  Weeks    Target Date  07/09/17      OT LONG TERM GOAL #5   Title  Pt. will increase left wrist extension in preparation for reaching ADL items.    Baseline  wrist extension 28 degrees    Time  12    Period  Weeks    Status  On-going    Target Date  07/09/17      OT LONG TERM GOAL #6   Title  Pt. will improve left hand coordination skills to be able to button clothing.    Baseline  Pt. conitnues to have difficulty    Time  12    Period  Weeks    Status  On-going    Target Date  07/09/17      OT LONG TERM GOAL #7   Title  Pt. will independently demonstrate visual compensatory strategies duirng ADLs, and IADLs.    Baseline  Glaucoma    Time  12    Period  Weeks    Status  On-going    Target Date  07/09/17            Plan - 06/16/17 1350    Clinical Impression Statement  Pt. has returned from [place] visiting friends. Pt. has improved with left hand edema. Pt. continues to work on improving LUE strength, and coordination skills. Pt. is concerned about his ability to play golf again.    Occupational performance deficits (Please refer to evaluation for details):  ADL's;IADL's    Rehab Potential  Excellent    OT Frequency  2x / week    OT Duration  12 weeks    OT  Treatment/Interventions  Self-care/ADL training;Therapeutic exercise;Neuromuscular education;Patient/family education;Energy conservation;Therapeutic activities;DME and/or AE instruction  Clinical Decision Making  Multiple treatment options, significant modification of task necessary    Consulted and Agree with Plan of Care  Patient       Patient will benefit from skilled therapeutic intervention in order to improve the following deficits and impairments:  Abnormal gait, Pain, Decreased strength, Decreased range of motion, Impaired UE functional use, Decreased coordination, Decreased knowledge of use of DME, Decreased activity tolerance, Difficulty walking, Decreased balance, Other (comment)  Visit Diagnosis: Muscle weakness (generalized)  Other lack of coordination    Problem List Patient Active Problem List   Diagnosis Date Noted  . Pure hypercholesterolemia 05/28/2017  . Erectile dysfunction 05/28/2017  . Left hemiparesis (HCC)   . Essential hypertension 11/29/2016  . 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 Hepatitis C 07/22/2013    Olegario Messier 06/16/2017, 2:12 PM  [facility] MAIN [facility] SERVICES [address] [place], [place], [address] Phone: [phone]   Fax:  [phone]  Name: Jiles Goya MRN: [id number] Date of Birth: Apr 29, 1946

## 2017-06-18 ENCOUNTER — Ambulatory Visit: Payer: Medicare Other | Admitting: Occupational Therapy

## 2017-06-18 ENCOUNTER — Ambulatory Visit: Payer: Medicare Other | Admitting: Physical Therapy

## 2017-06-18 ENCOUNTER — Encounter: Payer: Self-pay | Admitting: Occupational Therapy

## 2017-06-18 ENCOUNTER — Encounter: Payer: Self-pay | Admitting: Physical Therapy

## 2017-06-18 DIAGNOSIS — R2681 Unsteadiness on feet: Secondary | ICD-10-CM | POA: Diagnosis not present

## 2017-06-18 DIAGNOSIS — R278 Other lack of coordination: Secondary | ICD-10-CM

## 2017-06-18 DIAGNOSIS — M6281 Muscle weakness (generalized): Secondary | ICD-10-CM

## 2017-06-18 DIAGNOSIS — I639 Cerebral infarction, unspecified: Secondary | ICD-10-CM | POA: Diagnosis not present

## 2017-06-18 NOTE — Therapy (Signed)
[name] MAIN [facility] SERVICES [address] [place], [place], [address] Phone: [phone]   Fax:  [phone]  Physical Therapy Treatment  Patient Details  Name: Jason Nolan MRN: [id number] Date of Birth: 1945/08/06 Referring Provider: [name]   Encounter Date: 06/18/2017  PT End of Session - 06/18/17 1355    Visit Number  29    Number of Visits  71    Date for PT Re-Evaluation  09/10/17    PT Start Time  0100    PT Stop Time  0145    PT Time Calculation (min)  45 min    Equipment Utilized During Treatment  Gait belt    Activity Tolerance  Patient tolerated treatment well    Behavior During Therapy  [facility] for tasks assessed/performed       Past Medical History:  Diagnosis Date  . Glaucoma   . Hepatitis C   . Stroke [facility])     Past Surgical History:  Procedure Laterality Date  . IR ANGIO INTRA EXTRACRAN SEL COM CAROTID INNOMINATE UNI L MOD SED  11/25/2016  . IR ANGIO VERTEBRAL SEL SUBCLAVIAN INNOMINATE UNI R MOD SED  11/25/2016  . IR ANGIO VERTEBRAL SEL VERTEBRAL UNI L MOD SED  11/25/2016  . IR INTRAVSC STENT CERV CAROTID W/O EMB-PROT MOD SED INC ANGIO  11/25/2016  . IR PERCUTANEOUS ART THROMBECTOMY/INFUSION INTRACRANIAL INC DIAG ANGIO  11/25/2016  . IR RADIOLOGIST EVAL & MGMT  02/13/2017  . RADIOLOGY WITH ANESTHESIA N/A 11/25/2016   Procedure: RADIOLOGY WITH ANESTHESIA;  Surgeon: Radiologist, Medication, MD;  Location: [facility] OR;  Service: Radiology;  Laterality: N/A;    There were no vitals filed for this visit.  Subjective Assessment - 06/18/17 1355    Subjective  Patinet is doing fine today.No new concerns. Patient is doing his HEP; walking and doing his theraband exercises.    Pertinent History  Pt. is a 72 y.o. 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 Pt. was transferred to [facility]. He underwent CTA brain showed short segment near occlusion of proximal R-ICA with associated intraluminal  thrombus --likely due to dissection and emergent large vessel occlusion with right M2 occlusion and evolving right frontal lobe infarct.  He underwent cerebral angio with  complete revascularization of occluded superior division of R-MCA and near complete revascularization of proximal R-ICA with stent assisted angioplasty.  Stroke felt to be embolic in setting of ruptured plaque R-ICA and he was placed on ASA and brillinta due to stent. Pt. was transferred to inpatient rehab for several weeks, had home health therapy upon discharge.    Limitations  Walking;Lifting;House hold activities    How long can you sit comfortably?  unlimited     How long can you stand comfortably?  unlimited     How long can you walk comfortably?  unsure    Patient Stated Goals  "strengthen my whole L side"      NMR:  Standing on 2 disk tandem and pulling on theraband to rotate trunk left and right x 20 , cues for speed and posture  Standing on 1/2 foam flat side up and tandem stand and cone reaching low to stack them x 4 stacks and alternating UE, cues for control and reaching far out.  Walking on 2 x 4 fwd and bwd x 2, cues for posture  Matrix 17. 5 lbs stepping over a stool x 10 side stepping left and right x 5  Patient has fatigue in BLE during disk and tandem stand                      PT Education - 06/18/17 1355    Education provided  Yes    Education Details  dynamic standing balance    Person(s) Educated  Patient    Methods  Explanation;Demonstration    Comprehension  Verbalized understanding;Returned demonstration       PT Short Term Goals - 04/23/17 1442      PT SHORT TERM GOAL #1   Title  Pt will improve 5x sit to stand to 18 seconds in order to demonstrate improved LE strength and improved dynamic balance.    Baseline  21 sec; 02/26/17: 16.1s    Time  6    Period  Weeks    Status  Achieved      PT SHORT TERM GOAL #2   Title  Pt will improve gait speed to at least 1.0 m/s  with proper heel strike and decreased hip hike in order to demonstrate imrpoved LE strength and balance.    Baseline  0.83 m/s; 02/26/17: 1.11 m/s    Time  6    Period  Weeks    Status  Achieved      PT SHORT TERM GOAL #3   Title  Pt will improve Berg Balance score to 51/56 in order to demonstrate improved dynamic and static balance, decreasing overall falls risk.    Baseline  48/56; 02/26/17: 53/56    Time  6    Period  Weeks    Status  Achieved        PT Long Term Goals - 06/16/17 1308      PT LONG TERM GOAL #1   Title  Pt will demonstrate independence with HEP in order to continue LE strengthening and manage symptoms.    Time  12    Period  Weeks    Status  On-going    Target Date  08/11/17      PT LONG TERM GOAL #2   Title  Pt will improve 5x sit to stand to <15 seconds in order to demonstrate improved LE strength and dynamic balance.    Baseline  21 sec; 02/26/17: 16.1s 16.50 sec 04/23/17; 16 sec 05/26/17, 06/16/17 16 sec    Time  12    Period  Weeks    Status  On-going    Target Date  08/11/17      PT LONG TERM GOAL #3   Title  Pt will demonstrate gait speed of at least 1.2 m/s in order to demonstrate improved LE strength and improved balance in order to return to prior level of funciton.    Baseline  0.833 m/s 02/26/17: 1.11 m/s, 12/189/18 1.16 m/sec; 1.23 m/sec    Time  12    Period  Weeks    Status  Achieved    Target Date  06/16/17      PT LONG TERM GOAL #4   Title  Pt will improve Berg Balance score to at least 54/56 in order demonstrate improved dynamic and static balance, decreasing overall falls risk.    Baseline  48/56; 02/26/17: 53/56; 04/23/17  53/56    Time  12    Period  Weeks    Status  Achieved    Target Date  06/16/17      PT LONG TERM GOAL #5   Title  Pt will demonstrate improved LE strength to  4+/5 in all limited planes in order to return to prior level of function.    Baseline  LLE hip flex 4/5, abd 4+/5, ext 4/5, LLE knee 5/5, ankle 5/5     Time  12    Period  Weeks    Status  On-going    Target Date  08/11/17      Additional Long Term Goals   Additional Long Term Goals  Yes      PT LONG TERM GOAL #6   Title  Pt will improve to >1500 feet without using AD in order to return to prior level of function, demonstrating improvements in LE strength, balance and endurance.    Baseline  1205 ft without AD; 02/26/17: deferred to next session 04/23/17 1420 ft, 1525 feet    Time  12    Period  Weeks    Status  On-going    Target Date  08/11/17      PT LONG TERM GOAL #7   Title  Patient will perform TM walking elevated . 3 with increased speed and no loss of balance for 5 mins to be able to walk a golf  course.    Time  12    Period  Weeks    Status  New    Target Date  08/11/17            Plan - 06/18/17 1356    Clinical Impression Statement  Patient demonstrates LOB with standing balance exercises indicating decreased balancing strategies. Patient did not require UE support for balance disk, and tandem stand, and rocker board with challenging balance exercise. Patient will benefit from further skilled therapy to return to prior level of function. Pt was encouraged to perform HEP during the week in order to continue progressing balance and strength interventions. Pt would continue to benefit from skilled therapy services in order to further address LE strength deficits and balance deficits in order to decrease fall risk and improve mobility.    Rehab Potential  Good    Clinical Impairments Affecting Rehab Potential  high prior level of function    PT Frequency  2x / week    PT Duration  12 weeks    PT Treatment/Interventions  ADLs/Self Care Home Management;Cryotherapy;Moist Heat;Gait training;Stair training;Functional mobility training;Therapeutic activities;Therapeutic exercise;Balance training;Neuromuscular re-education;Patient/family education;Manual techniques    PT Next Visit Plan  , MMT, update those specific  goals, continue strength/balance;    PT Home Exercise Plan  Add green theraband to HEP exercises    Consulted and Agree with Plan of Care  Patient       Patient will benefit from skilled therapeutic intervention in order to improve the following deficits and impairments:  Abnormal gait, Decreased balance, Decreased strength, Difficulty walking, Increased edema  Visit Diagnosis: Muscle weakness (generalized) - Plan: PT plan of care cert/re-cert  Other lack of coordination - Plan: PT plan of care cert/re-cert  Unsteadiness on feet - Plan: PT plan of care cert/re-cert     Problem List Patient Active Problem List   Diagnosis Date Noted  . Pure hypercholesterolemia 05/28/2017  . Erectile dysfunction 05/28/2017  . Left hemiparesis (HCC)   . Essential hypertension 11/29/2016  . 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 Hepatitis C 07/22/2013    Ezekiel Ina, PT DPT 06/18/2017, 2:14 PM  [place] [facility] MAIN [facility] SERVICES  [address] [place], [place], [address] Phone: [phone]   Fax:  [phone]  Name: Jacques Fife MRN: [id number] Date of Birth: January 01, 1946

## 2017-06-18 NOTE — Therapy (Signed)
[facility] MAIN [facility] SERVICES [address] [place], [place], [address] Phone: [phone]   Fax:  [phone]  Occupational Therapy Treatment  Patient Details  Name: Jason Nolan MRN: [id number] Date of Birth: [birth year] Referring Provider: [name]   Encounter Date: 06/18/2017  OT End of Session - 06/18/17 1408    Visit Number  26    Number of Visits  48    Date for OT Re-Evaluation  07/09/17    OT Start Time  1345    OT Stop Time  1430    OT Time Calculation (min)  45 min    Activity Tolerance  Patient tolerated treatment well    Behavior During Therapy  [facility] for tasks assessed/performed       Past Medical History:  Diagnosis Date  . Glaucoma   . Hepatitis C   . Stroke [facility])     Past Surgical History:  Procedure Laterality Date  . IR ANGIO INTRA EXTRACRAN SEL COM CAROTID INNOMINATE UNI L MOD SED  11/25/2016  . IR ANGIO VERTEBRAL SEL SUBCLAVIAN INNOMINATE UNI R MOD SED  11/25/2016  . IR ANGIO VERTEBRAL SEL VERTEBRAL UNI L MOD SED  11/25/2016  . IR INTRAVSC STENT CERV CAROTID W/O EMB-PROT MOD SED INC ANGIO  11/25/2016  . IR PERCUTANEOUS ART THROMBECTOMY/INFUSION INTRACRANIAL INC DIAG ANGIO  11/25/2016  . IR RADIOLOGIST EVAL & MGMT  02/13/2017  . RADIOLOGY WITH ANESTHESIA N/A 11/25/2016   Procedure: RADIOLOGY WITH ANESTHESIA;  Surgeon: Radiologist, Medication, MD;  Location: [facility] OR;  Service: Radiology;  Laterality: N/A;    There were no vitals filed for this visit.  Subjective Assessment - 06/18/17 1400    Subjective   Pt. reports he had a stomach bug last week.    Patient is accompained by:  Family member    Pertinent History  Pt. is a 72 y.o. 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 Pt. was transferred to [facility]. He underwent CTA brain showed short segment near occlusion of proximal R-ICA with associated intraluminal thrombus --likely due to dissection and emergent large vessel occlusion with right M2  occlusion and evolving right frontal lobe infarct.  He underwent cerebral angio with  complete revascularization of occluded superior division of R-MCA and near complete revascularization of proximal R-ICA with stent assisted angioplasty.  Stroke felt to be embolic in setting of ruptured plaque R-ICA and he was placed on ASA and brillinta due to stent. Pt. was transferred to inpatient rehab for several weeks, had home health therapy upon discharge, and now is ready for outpatient therapy services.        OT TREATMENT    Neuro muscular re-education:  Pt. worked on tasks to sustain lateral pinch on resistive tweezers while grasping and moving 2" toothpick sticks from a horizontal flat position to a vertical position in order to place it in the holder. Pt. was able to sustain grasp while positioning and extending the wrist/hand in the necessary alignment needed to place the stick through the top of the holder.  Therapeutic Exercise: Pt. Worked on the [employer] for 8 min. With constant monitoring of the BUEs. Pt. Worked on changing, and alternating forward reverse position every 2 min. Rest breaks were required.  Pt. Worked on pinch strengthening in the left hand for lateral, and 3pt. pinch using red, and green resistive clips. Pt. worked on placing the clips at various vertical and horizontal angles. Tactile and verbal cues were required for eliciting  the desired movement.                        OT Education - 06/18/17 1401    Education provided  Yes    Education Details  LUE functioning.    Person(s) Educated  Patient    Methods  Demonstration;Explanation    Comprehension  Verbalized understanding;Returned demonstration          OT Long Term Goals - 04/16/17 1633      OT LONG TERM GOAL #1   Title  Pt. will improve edema by 2 cm through MPs in preparation for ROM.    Baseline  improving    Time  12    Period  Weeks    Status  On-going    Target Date  07/09/17       OT LONG TERM GOAL #2   Title  Pt. will improve Left hand digit extension in preparation for weightbearing, and releasing objects during ADLs/IADLs.    Baseline  Pt. is unable to achieve full digit extension on the left.    Time  12    Period  Weeks    Status  On-going    Target Date  07/09/17      OT LONG TERM GOAL #3   Title  Pt. left hand grip with  increase by 5# in preparation for holding a cup.    Baseline  Left 2#, RIght: 60    Time  12    Period  Weeks    Status  On-going    Target Date  07/09/17      OT LONG TERM GOAL #4   Title  Pt. will increased right shoulder strength by 1 mm grade to assist with ADLs.    Baseline  Impaired LUE strength    Time  12    Period  Weeks    Target Date  07/09/17      OT LONG TERM GOAL #5   Title  Pt. will increase left wrist extension in preparation for reaching ADL items.    Baseline  wrist extension 28 degrees    Time  12    Period  Weeks    Status  On-going    Target Date  07/09/17      OT LONG TERM GOAL #6   Title  Pt. will improve left hand coordination skills to be able to button clothing.    Baseline  Pt. conitnues to have difficulty    Time  12    Period  Weeks    Status  On-going    Target Date  07/09/17      OT LONG TERM GOAL #7   Title  Pt. will independently demonstrate visual compensatory strategies duirng ADLs, and IADLs.    Baseline  Glaucoma    Time  12    Period  Weeks    Status  On-going    Target Date  07/09/17            Plan - 06/18/17 1411    Clinical Impression Statement  Pt.'s edema has improved. Pt. presents with limited LUE strength, and coordination skills.  Pt. continues to work on improving sustained lateral pinch with wrist extension during ADL, and IADL tasks.    Occupational performance deficits (Please refer to evaluation for details):  ADL's;IADL's    Rehab Potential  Excellent    OT Frequency  2x / week    OT Duration  12 weeks  OT Treatment/Interventions  Self-care/ADL  training;Therapeutic exercise;Neuromuscular education;Patient/family education;Energy conservation;Therapeutic activities;DME and/or AE instruction    Clinical Decision Making  Multiple treatment options, significant modification of task necessary    Consulted and Agree with Plan of Care  Patient       Patient will benefit from skilled therapeutic intervention in order to improve the following deficits and impairments:  Abnormal gait, Pain, Decreased strength, Decreased range of motion, Impaired UE functional use, Decreased coordination, Decreased knowledge of use of DME, Decreased activity tolerance, Difficulty walking, Decreased balance, Other (comment)  Visit Diagnosis: Muscle weakness (generalized)  Other lack of coordination    Problem List Patient Active Problem List   Diagnosis Date Noted  . Pure hypercholesterolemia 05/28/2017  . Erectile dysfunction 05/28/2017  . Left hemiparesis (HCC)   . Essential hypertension 11/29/2016  . 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 Hepatitis C 07/22/2013    Olegario Messier 06/18/2017, 3:54 PM  [place] [facility] MAIN [facility] SERVICES [address] [place], [place], [address] Phone: [phone]   Fax:  [phone]  Name: Jason Nolan MRN: [id number] Date of Birth: November 18, 1945

## 2017-06-19 ENCOUNTER — Encounter: Payer: Self-pay | Admitting: Internal Medicine

## 2017-06-20 ENCOUNTER — Inpatient Hospital Stay: Payer: Medicare Other | Attending: Hematology and Oncology | Admitting: Hematology and Oncology

## 2017-06-20 ENCOUNTER — Inpatient Hospital Stay: Payer: Medicare Other

## 2017-06-20 ENCOUNTER — Encounter: Payer: Self-pay | Admitting: Hematology and Oncology

## 2017-06-20 VITALS — BP 149/78 | HR 53 | Temp 96.5°F | Resp 18 | Wt 202.0 lb

## 2017-06-20 DIAGNOSIS — Z87891 Personal history of nicotine dependence: Secondary | ICD-10-CM

## 2017-06-20 DIAGNOSIS — E538 Deficiency of other specified B group vitamins: Secondary | ICD-10-CM | POA: Diagnosis not present

## 2017-06-20 DIAGNOSIS — Z8619 Personal history of other infectious and parasitic diseases: Secondary | ICD-10-CM | POA: Diagnosis not present

## 2017-06-20 DIAGNOSIS — D72819 Decreased white blood cell count, unspecified: Secondary | ICD-10-CM

## 2017-06-20 DIAGNOSIS — Z79899 Other long term (current) drug therapy: Secondary | ICD-10-CM | POA: Insufficient documentation

## 2017-06-20 DIAGNOSIS — D72818 Other decreased white blood cell count: Secondary | ICD-10-CM | POA: Insufficient documentation

## 2017-06-20 DIAGNOSIS — B182 Chronic viral hepatitis C: Secondary | ICD-10-CM | POA: Diagnosis not present

## 2017-06-20 LAB — CBC WITH DIFFERENTIAL/PLATELET
BASOS PCT: 1 %
Basophils Absolute: 0 10*3/uL (ref 0–0.1)
EOS ABS: 0.1 10*3/uL (ref 0–0.7)
EOS PCT: 4 %
HCT: 44.6 % (ref 40.0–52.0)
HEMOGLOBIN: 14.8 g/dL (ref 13.0–18.0)
LYMPHS ABS: 1.1 10*3/uL (ref 1.0–3.6)
Lymphocytes Relative: 36 %
MCH: 30.7 pg (ref 26.0–34.0)
MCHC: 33.3 g/dL (ref 32.0–36.0)
MCV: 92.1 fL (ref 80.0–100.0)
Monocytes Absolute: 0.2 10*3/uL (ref 0.2–1.0)
Monocytes Relative: 8 %
NEUTROS PCT: 51 %
Neutro Abs: 1.5 10*3/uL (ref 1.4–6.5)
PLATELETS: 202 10*3/uL (ref 150–440)
RBC: 4.84 MIL/uL (ref 4.40–5.90)
RDW: 14.3 % (ref 11.5–14.5)
WBC: 3 10*3/uL — ABNORMAL LOW (ref 3.8–10.6)

## 2017-06-20 LAB — FOLATE: Folate: 9.4 ng/mL (ref >5.9)

## 2017-06-20 MED ORDER — TADALAFIL 20 MG PO TABS: 10.0000 mg | ORAL_TABLET | ORAL | 2 refills | Status: DC | PRN

## 2017-06-20 NOTE — Progress Notes (Signed)
[facility]-  [facility] Center  Clinic day:  06/20/2017  Chief Complaint: Jason Nolan is a 72 y.o. male with leukopenia who is referred in consultation by Nicki Reaper, NP for assessment and management.  HPI:  The patient notes a low white blood count for "a long time".  Records indicate chronic leukopenia dating back to at least 07/22/2013.  At that time, CBC revealed a hematocrit of 47.8, hemoglobin 15.6, MCV 93.7, platelets 204,000, white count 2900.  WBC has ranged between 2900 and 4900.  Last ANC was 2900 on 11/26/2016.  CBC on 05/28/2017 included a hematocrit of 44.6, hemoglobin 15, MCV 92, platelets 201,000 and white count 2900.  Creatinine was 0.91 and LFTs were normal.  Patient has a history of hepatitis C.  He was followed by Sharon Mt at the [facility] (last seen 12/06/2015).  He had Hepatitis C, genotype 1b.  He was previously treated with PEG-Interferon/RBV in 2009-2010.  His viral load never became undetectable and treatment was discontinued at month 7 when viral levels began to increase. Patient notes difficulty with regimen due to extreme fatigue.  In 02/2012, Fibrosure showed F3-Bridging fibrosis with many septa and A1-minimal inflammation, with normal LFTs.  In 09/2012, Fibroscan showed F2 disease and repeat Fibroscan in 2015 suggestive of F1.    He was treated with Harvoni.  Hepatitis C RNA by PCR was undetectable in 12/2015.  CBCs in the [facility] system from 05/29/2011 - 09/14/2014 revealed a WBC 2400 - 3500.    Symptomatically, he is doing "ok". He denies any physical complains today. He denies bleeding; no hematochezia, melena, or gross hematuria. He has no B symptoms.  He has had no recent infections.  Patient denies pain in the clinic today.   He eats well. He consumes no red meat or pork. His diet consists of chicken, fish, and vegetables. He has a "plant based diet".  He does not eat fried food.  He denies any new medications or herbal  products.  Patient denies any family history of any oncology or hematology disorders.    Past Medical History:  Diagnosis Date  . Glaucoma   . Hepatitis C   . Stroke [facility])     Past Surgical History:  Procedure Laterality Date  . IR ANGIO INTRA EXTRACRAN SEL COM CAROTID INNOMINATE UNI L MOD SED  11/25/2016  . IR ANGIO VERTEBRAL SEL SUBCLAVIAN INNOMINATE UNI R MOD SED  11/25/2016  . IR ANGIO VERTEBRAL SEL VERTEBRAL UNI L MOD SED  11/25/2016  . IR INTRAVSC STENT CERV CAROTID W/O EMB-PROT MOD SED INC ANGIO  11/25/2016  . IR PERCUTANEOUS ART THROMBECTOMY/INFUSION INTRACRANIAL INC DIAG ANGIO  11/25/2016  . IR RADIOLOGIST EVAL & MGMT  02/13/2017  . RADIOLOGY WITH ANESTHESIA N/A 11/25/2016   Procedure: RADIOLOGY WITH ANESTHESIA;  Surgeon: Radiologist, Medication, MD;  Location: [facility] OR;  Service: Radiology;  Laterality: N/A;    Family History  Problem Relation Age of Onset  . Hypertension Mother   . Hypertension Father     Social History:  reports that he quit smoking about 29 years ago. He has a 7.50 pack-year smoking history. he has never used smokeless tobacco. He reports that he does not drink alcohol or use drugs.  Patient does not smoke or drink alcohol. Patient originally from [place]. He moved to [place] in 2010. 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 The patient is alone today.   Allergies: No Known Allergies  Current Medications: Current Outpatient Medications  Medication  Sig Dispense Refill  . amLODipine (NORVASC) 10 MG tablet TAKE 1 TABLET DAILY 90 tablet 0  . aspirin 81 MG chewable tablet Chew 1 tablet (81 mg total) by mouth daily.    Marland Kitchen. atorvastatin (LIPITOR) 40 MG tablet TAKE 1 TABLET DAILY AT 6 P.M. 90 tablet 0  . BRILINTA 90 MG TABS tablet TAKE 1 TABLET TWICE A DAY 180 tablet 0  . dorzolamide-timolol (COSOPT) 22.3-6.8 MG/ML ophthalmic solution     . PAZEO 0.7 % SOLN Place 1 drop into both eyes daily.     . timolol (TIMOPTIC) 0.5 % ophthalmic solution Place 1 drop into the  right eye daily.     Marland Kitchen. VYZULTA 0.024 % SOLN Apply 1 drop to eye at bedtime.     . tadalafil (CIALIS) 20 MG tablet Take 0.5-1 tablets (10-20 mg total) by mouth every other day as needed for erectile dysfunction. 10 tablet 2   No current facility-administered medications for this visit.     Review of Systems:  GENERAL:  Feels "fine".  No fevers, sweats or weight loss. PERFORMANCE STATUS (ECOG):  0 HEENT:  No visual changes, runny nose, sore throat, mouth sores or tenderness.  Needs wisdom tooth removed. Lungs: No shortness of breath or cough.  No hemoptysis. Cardiac:  No chest pain, palpitations, orthopnea, or PND. GI:  Constipation, sometimes.  No nausea, vomiting, diarrhea, melena or hematochezia. GU:  No urgency, frequency, dysuria, or hematuria. Musculoskeletal:  No back pain.  No joint pain.  No muscle tenderness. Extremities:  No pain or swelling. Skin:  No rashes or skin changes. Neuro:  No headache, numbness or weakness, balance or coordination issues. Endocrine:  No diabetes, thyroid issues, hot flashes or night sweats. Psych:  No mood changes, depression or anxiety. Pain:  No focal pain. Review of systems:  All other systems reviewed and found to be negative.  Physical Exam: Blood pressure (!) 149/78, pulse (!) 53, temperature (!) 96.5 F (35.8 C), temperature source Tympanic, resp. rate 18, weight 202 lb (91.6 kg), SpO2 100 %. GENERAL:  Well developed, well nourished, gentleman sitting comfortably in the exam room in no acute distress. MENTAL STATUS:  Alert and oriented to person, place and time. HEAD:  Wearing black cap.  Goatee.  Normocephalic, atraumatic, face symmetric, no Cushingoid features. EYES:  Glasses. Brown eyes.  Pupils equal round and reactive to light and accomodation.  No conjunctivitis or scleral icterus. ENT:  Oropharynx clear without lesion.  Tongue normal. Mucous membranes moist.  RESPIRATORY:  Clear to auscultation without rales, wheezes or  rhonchi. CARDIOVASCULAR:  Regular rate and rhythm without murmur, rub or gallop. ABDOMEN:  Soft, non-tender, with active bowel sounds, and no appreciable hepatosplenomegaly.  No masses. SKIN:  No rashes, ulcers or lesions. EXTREMITIES: No edema, no skin discoloration or tenderness.  No palpable cords. LYMPH NODES: No palpable cervical, supraclavicular, axillary or inguinal adenopathy  NEUROLOGICAL: Unremarkable. PSYCH:  Appropriate.   No visits with results within 3 Day(s) from this visit.  Latest known visit with results is:  Office Visit on 05/28/2017  Component Date Value Ref Range Status  . WBC 05/28/2017 2.9* 4.0 - 10.5 K/uL Final  . RBC 05/28/2017 4.85  4.22 - 5.81 Mil/uL Final  . Platelets 05/28/2017 201.0  150.0 - 400.0 K/uL Final  . Hemoglobin 05/28/2017 15.0  13.0 - 17.0 g/dL Final  . HCT 16/10/960401/23/2019 44.6  39.0 - 52.0 % Final  . MCV 05/28/2017 92.0  78.0 - 100.0 fl Final  . MCHC 05/28/2017  33.6  30.0 - 36.0 g/dL Final  . RDW 40/98/1191 14.5  11.5 - 15.5 % Final  . Sodium 05/28/2017 140  135 - 145 mEq/L Final  . Potassium 05/28/2017 4.0  3.5 - 5.1 mEq/L Final  . Chloride 05/28/2017 104  96 - 112 mEq/L Final  . CO2 05/28/2017 25  19 - 32 mEq/L Final  . Glucose, Bld 05/28/2017 81  70 - 99 mg/dL Final  . BUN 47/82/9562 14  6 - 23 mg/dL Final  . Creatinine, Ser 05/28/2017 0.91  0.40 - 1.50 mg/dL Final  . Total Bilirubin 05/28/2017 0.9  0.2 - 1.2 mg/dL Final  . Alkaline Phosphatase 05/28/2017 68  39 - 117 U/L Final  . AST 05/28/2017 21  0 - 37 U/L Final  . ALT 05/28/2017 32  0 - 53 U/L Final  . Total Protein 05/28/2017 7.6  6.0 - 8.3 g/dL Final  . Albumin 13/12/6576 4.7  3.5 - 5.2 g/dL Final  . Calcium 46/96/2952 9.4  8.4 - 10.5 mg/dL Final  . GFR 84/13/2440 106.15  >60.00 mL/min Final  . Cholesterol 05/28/2017 125  0 - 200 mg/dL Final   ATP III Classification       Desirable:  < 200 mg/dL               Borderline High:  200 - 239 mg/dL          High:  > = 102 mg/dL  .  Triglycerides 05/28/2017 41.0  0.0 - 149.0 mg/dL Final   Normal:  <725 mg/dLBorderline High:  150 - 199 mg/dL  . HDL 05/28/2017 68.00  >39.00 mg/dL Final  . VLDL 36/64/4034 8.2  0.0 - 74.2 mg/dL Final  . LDL Cholesterol 05/28/2017 49  0 - 99 mg/dL Final  . Total CHOL/HDL Ratio 05/28/2017 2   Final                  Men          Women1/2 Average Risk     3.4          3.3Average Risk          5.0          4.42X Average Risk          9.6          7.13X Average Risk          15.0          11.0                      . NonHDL 05/28/2017 57.09   Final   NOTE:  Non-HDL goal should be 30 mg/dL higher than patient's LDL goal (i.e. LDL goal of < 70 mg/dL, would have non-HDL goal of < 100 mg/dL)  . PSA 05/28/2017 0.36  0.10 - 4.00 ng/ml Final   Test performed using Access Hybritech PSA Assay, a parmagnetic partical, chemiluminecent immunoassay.    Assessment:  Jason Nolan is a 72 y.o. 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  WBC has ranged between 2400 - 4900.  ANC was 2900 on 11/26/2016.  Diet is good.  He is on no new medications or herbal products.  CBC on 05/28/2017 included a hematocrit of 44.6, hemoglobin 15, MCV 92, platelets 201,000 and white count 2900.  Creatinine was 0.91 and LFTs were normal.  He has a history of hepatitis C, genotype 1b.  He was previously treated with  PEG-Interferon/RBV in 2009-2010.  His viral load never became undetectable and treatment was discontinued.  He was treated with Harvoni.  Hepatitis C RNA by PCR was undetectable in 12/2015.    Symptomatically, he denies any complaints.  He denies any issues with infections.  Exam reveals no adenopathy or hepatosplenomegaly.  Plan: 1.  Discuss differential diagnosis of leukopenia. Patient asymptomatic.  Possible benign ethnic leukopenia or related to hepatitis. Discuss further laboratory testing to evaluate.  2.  Labs today:  CBC with diff, B12,  folate, hepatitis C RNA PCR, hepatitis B core antibody total,  HIV, copper, ANA with reflex. Verbal consent for HIV and hepatitis testing obtained.  3.  Discuss neutropenia precautions. Patient to call clinic if he develops any febrile illness symptoms.  4.  RTC in 1 week for MD assessment and review of workup and discussion regarding direction of therapy.    Quentin Mulling, NP  06/20/2017, 3:06 PM   I saw and evaluated the patient, participating in the key portions of the service and reviewing pertinent diagnostic studies and records.  I reviewed the nurse practitioner's note and agree with the findings and the plan.  The assessment and plan were discussed with the patient.  Several questions were asked by the patient and answered.   Nelva Nay, MD 06/20/2017, 3:06 PM

## 2017-06-21 LAB — VITAMIN B12: VITAMIN B 12: 271 pg/mL (ref 180–914)

## 2017-06-21 LAB — COPPER, SERUM: COPPER: 108 ug/dL (ref 72–166)

## 2017-06-21 LAB — HIV ANTIBODY (ROUTINE TESTING W REFLEX): HIV SCREEN 4TH GENERATION: NONREACTIVE

## 2017-06-21 LAB — ANA W/REFLEX: ANA: NEGATIVE

## 2017-06-21 LAB — HEPATITIS B CORE ANTIBODY, TOTAL: HEP B C TOTAL AB: POSITIVE — AB

## 2017-06-23 ENCOUNTER — Ambulatory Visit: Payer: Medicare Other | Admitting: Physical Therapy

## 2017-06-23 ENCOUNTER — Encounter: Payer: Self-pay | Admitting: Occupational Therapy

## 2017-06-23 ENCOUNTER — Encounter: Payer: Self-pay | Admitting: Physical Therapy

## 2017-06-23 ENCOUNTER — Ambulatory Visit: Payer: Medicare Other | Admitting: Occupational Therapy

## 2017-06-23 DIAGNOSIS — M6281 Muscle weakness (generalized): Secondary | ICD-10-CM | POA: Diagnosis not present

## 2017-06-23 DIAGNOSIS — R278 Other lack of coordination: Secondary | ICD-10-CM

## 2017-06-23 DIAGNOSIS — H401121 Primary open-angle glaucoma, left eye, mild stage: Secondary | ICD-10-CM | POA: Diagnosis not present

## 2017-06-23 DIAGNOSIS — I639 Cerebral infarction, unspecified: Secondary | ICD-10-CM | POA: Diagnosis not present

## 2017-06-23 DIAGNOSIS — H401112 Primary open-angle glaucoma, right eye, moderate stage: Secondary | ICD-10-CM | POA: Diagnosis not present

## 2017-06-23 DIAGNOSIS — R2681 Unsteadiness on feet: Secondary | ICD-10-CM

## 2017-06-23 LAB — HEPATITIS C VRS RNA DETECT BY PCR-QUAL: Hepatitis C Vrs RNA by PCR-Qual: NEGATIVE

## 2017-06-23 NOTE — Therapy (Addendum)
[facility] [address] [place], [place], [address] Phone: [phone]   Fax:  [phone]  Physical Therapy Treatment  Patient Details  Name: Jason Nolan MRN: [id number] Date of Birth: 06-13-1945 Referring Provider: Lorre MunroeBAITY, REGINA W   Encounter Date: 06/23/2017  PT End of Session - 06/23/17 1401    Visit Number  30    Number of Visits  71    Date for PT Re-Evaluation  09/10/17    PT Start Time  0105    PT Stop Time  0145    PT Time Calculation (min)  40 min    Equipment Utilized During Treatment  Gait belt    Activity Tolerance  Patient tolerated treatment well    Behavior During Therapy  [facility] for tasks assessed/performed       Past Medical History:  Diagnosis Date  . Glaucoma   . Hepatitis C   . Stroke [facility](HCC)     Past Surgical History:  Procedure Laterality Date  . IR ANGIO INTRA EXTRACRAN SEL COM CAROTID INNOMINATE UNI L MOD SED  11/25/2016  . IR ANGIO VERTEBRAL SEL SUBCLAVIAN INNOMINATE UNI R MOD SED  11/25/2016  . IR ANGIO VERTEBRAL SEL VERTEBRAL UNI L MOD SED  11/25/2016  . IR INTRAVSC STENT CERV CAROTID W/O EMB-PROT MOD SED INC ANGIO  11/25/2016  . IR PERCUTANEOUS ART THROMBECTOMY/INFUSION INTRACRANIAL INC DIAG ANGIO  11/25/2016  . IR RADIOLOGIST EVAL & MGMT  02/13/2017  . RADIOLOGY WITH ANESTHESIA N/A 11/25/2016   Procedure: RADIOLOGY WITH ANESTHESIA;  Surgeon: Radiologist, Medication, MD;  Location: [facility] OR;  Service: Radiology;  Laterality: N/A;    There were no vitals filed for this visit.  Subjective Assessment - 06/23/17 1401    Subjective  Patinet is doing fine today.No new concerns. Patient is doing his HEP; walking and doing his theraband exercises.    Pertinent History  Pt. is a 72 y.o. 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 [facility] He underwent CTA brain showed short segment near occlusion of proximal R-ICA with associated intraluminal  thrombus --likely due to dissection and emergent large vessel occlusion with right M2 occlusion and evolving right frontal lobe infarct.  He underwent cerebral angio with  complete revascularization of occluded superior division of R-MCA and near complete revascularization of proximal R-ICA with stent assisted angioplasty.  Stroke felt to be embolic in setting of ruptured plaque R-ICA and he was placed on ASA and brillinta due to stent. Pt. was transferred to inpatient rehab for several weeks, had home health therapy upon discharge.    Limitations  Walking;Lifting;House hold activities    How long can you sit comfortably?  unlimited     How long can you stand comfortably?  unlimited     How long can you walk comfortably?  unsure    Patient Stated Goals  "strengthen my whole L side"    Currently in Pain?  No/denies    Pain Score  0-No pain    Multiple Pain Sites  No       Treatment:  Matrix fwd/ bwd 17. 5 lbs x 5 reps, side to side 12. 5 x 5 lap up and down step and over bolster, min assist and loss of balance 10 %  Tandem stand and cones across midline in parallel bars with LUE x 25, CGA and 10% LOB  Tandem stand , modified on purple foam and head turns x 20  with LOB towards left side , 50% LOB  Fwd/stepping and bwd stepping over 1/2 foam x 5 UE support 10%  Standing on purple foam and tapping sequence up steps without UE or railing support  Standing on single leg with cone reaching x 10   Standing 1/2 foam horizontal with toe taps to stepping stones x 10   CGA with all above exercises and min assist with single leg standing.   CGA and Min to mod verbal cues used throughout with increased in postural sway and LOB most seen with narrow base of support and while on uneven surfaces. Continues to have balance deficits typical with diagnosis. Patient performs intermediate level exercises without pain behaviors and needs verbal cuing for postural alignment and head positioning                        PT Education - 06/23/17 1401    Education provided  Yes    Education Details  safety    Person(s) Educated  Patient    Methods  Explanation    Comprehension  Verbalized understanding       PT Short Term Goals - 04/23/17 1442      PT SHORT TERM GOAL #1   Title  Pt will improve 5x sit to stand to 18 seconds in order to demonstrate improved LE strength and improved dynamic balance.    Baseline  21 sec; 02/26/17: 16.1s    Time  6    Period  Weeks    Status  Achieved      PT SHORT TERM GOAL #2   Title  Pt will improve gait speed to at least 1.0 m/s with proper heel strike and decreased hip hike in order to demonstrate imrpoved LE strength and balance.    Baseline  0.83 m/s; 02/26/17: 1.11 m/s    Time  6    Period  Weeks    Status  Achieved      PT SHORT TERM GOAL #3   Title  Pt will improve Berg Balance score to 51/56 in order to demonstrate improved dynamic and static balance, decreasing overall falls risk.    Baseline  48/56; 02/26/17: 53/56    Time  6    Period  Weeks    Status  Achieved        PT Long Term Goals - 06/16/17 1308      PT LONG TERM GOAL #1   Title  Pt will demonstrate independence with HEP in order to continue LE strengthening and manage symptoms.    Time  12    Period  Weeks    Status  On-going    Target Date  08/11/17      PT LONG TERM GOAL #2   Title  Pt will improve 5x sit to stand to <15 seconds in order to demonstrate improved LE strength and dynamic balance.    Baseline  21 sec; 02/26/17: 16.1s 16.50 sec 04/23/17; 16 sec 05/26/17, 06/16/17 16 sec    Time  12    Period  Weeks    Status  On-going    Target Date  08/11/17      PT LONG TERM GOAL #3   Title  Pt will demonstrate gait speed of at least 1.2 m/s in order to demonstrate improved LE strength and improved balance in order to return to prior level of funciton.    Baseline  0.833 m/s 02/26/17: 1.11 m/s, 12/189/18 1.16 m/sec; 1.23 m/sec  Time  12     Period  Weeks    Status  Achieved    Target Date  06/16/17      PT LONG TERM GOAL #4   Title  Pt will improve Berg Balance score to at least 54/56 in order demonstrate improved dynamic and static balance, decreasing overall falls risk.    Baseline  48/56; 02/26/17: 53/56; 04/23/17  53/56    Time  12    Period  Weeks    Status  Achieved    Target Date  06/16/17      PT LONG TERM GOAL #5   Title  Pt will demonstrate improved LE strength to 4+/5 in all limited planes in order to return to prior level of function.    Baseline  LLE hip flex 4/5, abd 4+/5, ext 4/5, LLE knee 5/5, ankle 5/5    Time  12    Period  Weeks    Status  On-going    Target Date  08/11/17      Additional Long Term Goals   Additional Long Term Goals  Yes      PT LONG TERM GOAL #6   Title  Pt will improve to >1500 feet without using AD in order to return to prior level of function, demonstrating improvements in LE strength, balance and endurance.    Baseline  1205 ft without AD; 02/26/17: deferred to next session 04/23/17 1420 ft, 1525 feet    Time  12    Period  Weeks    Status  On-going    Target Date  08/11/17      PT LONG TERM GOAL #7   Title  Patient will perform TM walking elevated . 3 with increased speed and no loss of balance for 5 mins to be able to walk a golf  course.    Time  12    Period  Weeks    Status  New    Target Date  08/11/17            Plan - 06/23/17 1402    Clinical Impression Statement  Pt presents with unsteadiness on uneven surfaces and fatigues with therapeutic exercises. Patient needs assist with single leg balance activities and needs CGA assist with longer single leg timed standing activities. Patient demonstrates difficulty with dynamic standing balance and increased postural sway while on purple foam with decreased base of support and increased challenges for UE.  Patient tolerated all interventions well this date and will benefit from continued skilled PT  interventions to improve strength and balance and decrease risk of falling    Rehab Potential  Good    Clinical Impairments Affecting Rehab Potential  high prior level of function    PT Frequency  2x / week    PT Duration  12 weeks    PT Treatment/Interventions  ADLs/Self Care Home Management;Cryotherapy;Moist Heat;Gait training;Stair training;Functional mobility training;Therapeutic activities;Therapeutic exercise;Balance training;Neuromuscular re-education;Patient/family education;Manual techniques    PT Next Visit Plan  , MMT, update those specific goals, continue strength/balance;    PT Home Exercise Plan  Add green theraband to HEP exercises    Consulted and Agree with Plan of Care  Patient       Patient will benefit from skilled therapeutic intervention in order to improve the following deficits and impairments:  Abnormal gait, Decreased balance, Decreased strength, Difficulty walking, Increased edema  Visit Diagnosis: Muscle weakness (generalized)  Other lack of coordination  Unsteadiness on feet  Problem List Patient Active Problem List   Diagnosis Date Noted  . Pure hypercholesterolemia 05/28/2017  . Erectile dysfunction 05/28/2017  . Left hemiparesis (HCC)   . Essential hypertension 11/29/2016  . 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 Hepatitis C 07/22/2013    Jason Nolan, [place] 06/23/2017, 2:34 PM  [facility] MAIN [facility] SERVICES [address] [place], [place], [address] Phone: [phone]   Fax:  [phone]  Name: Jason Nolan MRN: [id number] Date of Birth: 1945/07/22

## 2017-06-23 NOTE — Therapy (Signed)
[facility] MAIN [facility] SERVICES [address] [place], [place], [address] Phone: [phone]   Fax:  [phone]  Occupational Therapy Treatment  Patient Details  Name: Jason Nolan MRN: [id number] Date of Birth: [birth year] Referring Provider: [name]   Encounter Date: 06/23/2017  OT End of Session - 06/23/17 1359    Visit Number  27    Number of Visits  48    Date for OT Re-Evaluation  07/09/17    OT Start Time  1345    OT Stop Time  1430    OT Time Calculation (min)  45 min    Activity Tolerance  Patient tolerated treatment well    Behavior During Therapy  [facility] for tasks assessed/performed       Past Medical History:  Diagnosis Date  . Glaucoma   . Hepatitis C   . Stroke [facility])     Past Surgical History:  Procedure Laterality Date  . IR ANGIO INTRA EXTRACRAN SEL COM CAROTID INNOMINATE UNI L MOD SED  11/25/2016  . IR ANGIO VERTEBRAL SEL SUBCLAVIAN INNOMINATE UNI R MOD SED  11/25/2016  . IR ANGIO VERTEBRAL SEL VERTEBRAL UNI L MOD SED  11/25/2016  . IR INTRAVSC STENT CERV CAROTID W/O EMB-PROT MOD SED INC ANGIO  11/25/2016  . IR PERCUTANEOUS ART THROMBECTOMY/INFUSION INTRACRANIAL INC DIAG ANGIO  11/25/2016  . IR RADIOLOGIST EVAL & MGMT  02/13/2017  . RADIOLOGY WITH ANESTHESIA N/A 11/25/2016   Procedure: RADIOLOGY WITH ANESTHESIA;  Surgeon: Radiologist, Medication, MD;  Location: [facility] OR;  Service: Radiology;  Laterality: N/A;    There were no vitals filed for this visit.  Subjective Assessment - 06/23/17 1348    Subjective   Pt. reports he is progressing.    Patient is accompained by:  Family member    Pertinent History  Pt. is a 72 y.o. 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 Pt. was transferred to [facility]. He underwent CTA brain showed short segment near occlusion of proximal R-ICA with associated intraluminal thrombus --likely due to dissection and emergent large vessel occlusion with right M2 occlusion and  evolving right frontal lobe infarct.  He underwent cerebral angio with  complete revascularization of occluded superior division of R-MCA and near complete revascularization of proximal R-ICA with stent assisted angioplasty.  Stroke felt to be embolic in setting of ruptured plaque R-ICA and he was placed on ASA and brillinta due to stent. Pt. was transferred to inpatient rehab for several weeks, had home health therapy upon discharge, and now is ready for outpatient therapy services.    Limitations  Dominant LUE functioning, left hand edema, distal ROM, coordination, and overal strength.    Currently in Pain?  No/denies    Pain Score  0-No pain      OT TREATMENT    Neuro muscular re-education:  Pt. performed [facility] tasks using the Grooved pegboard. Pt. worked on grasping the grooved pegs from a horizontal position, and moving the pegs to a vertical position sliding them off of the edge of a flat surface, Pt. Worked with multiple sizes of  Pegs with emphasis placed on bringing the object to the tip of his 2nd digit, and thumb. Pt. worked on tasks to sustain lateral pinch on resistive tweezers while grasping and moving 2" toothpick sticks from a horizontal flat position to a vertical position in order to place it in the holder. Pt. was able to sustain grasp while positioning and extending the wrist/hand in  the necessary alignment needed to place the stick through the top of the holder.  Therapeutic Exercise:  Pt. performed gross gripping with grip strengthener. Pt. worked on sustaining grip while grasping pegs and reaching at various heights. Gripper was placed in the 4th resistive slot with the white resistive spring. Pt. Worked on pinch strengthening in the left hand for lateral, and 3pt. pinch using red, and green resistive clips. Pt. worked on placing the clips at various vertical and horizontal angles. Tactile and verbal cues were required for eliciting the desired  movement.                        OT Education - 06/23/17 1359    Education provided  Yes    Education Details  LUE functioning.    Person(s) Educated  Patient    Methods  Explanation;Demonstration    Comprehension  Returned demonstration          OT Long Term Goals - 04/16/17 1633      OT LONG TERM GOAL #1   Title  Pt. will improve edema by 2 cm through MPs in preparation for ROM.    Baseline  improving    Time  12    Period  Weeks    Status  On-going    Target Date  07/09/17      OT LONG TERM GOAL #2   Title  Pt. will improve Left hand digit extension in preparation for weightbearing, and releasing objects during ADLs/IADLs.    Baseline  Pt. is unable to achieve full digit extension on the left.    Time  12    Period  Weeks    Status  On-going    Target Date  07/09/17      OT LONG TERM GOAL #3   Title  Pt. left hand grip with  increase by 5# in preparation for holding a cup.    Baseline  Left 2#, RIght: 60    Time  12    Period  Weeks    Status  On-going    Target Date  07/09/17      OT LONG TERM GOAL #4   Title  Pt. will increased right shoulder strength by 1 mm grade to assist with ADLs.    Baseline  Impaired LUE strength    Time  12    Period  Weeks    Target Date  07/09/17      OT LONG TERM GOAL #5   Title  Pt. will increase left wrist extension in preparation for reaching ADL items.    Baseline  wrist extension 28 degrees    Time  12    Period  Weeks    Status  On-going    Target Date  07/09/17      OT LONG TERM GOAL #6   Title  Pt. will improve left hand coordination skills to be able to button clothing.    Baseline  Pt. conitnues to have difficulty    Time  12    Period  Weeks    Status  On-going    Target Date  07/09/17      OT LONG TERM GOAL #7   Title  Pt. will independently demonstrate visual compensatory strategies duirng ADLs, and IADLs.    Baseline  Glaucoma    Time  12    Period  Weeks    Status  On-going     Target Date  07/09/17  Plan - 06/23/17 1424    Clinical Impression Statement  Pt. conitnues to make progress with left hand function skills. Emphasis was placed on movement patterns bringing the tip of his second digit to his thumb. Grasping with the tip of his 2nd digit, and thumb.     Occupational performance deficits (Please refer to evaluation for details):  ADL's;IADL's    Rehab Potential  Excellent    OT Frequency  2x / week    OT Duration  12 weeks    OT Treatment/Interventions  Self-care/ADL training;Therapeutic exercise;Neuromuscular education;Patient/family education;Energy conservation;Therapeutic activities;DME and/or AE instruction    Clinical Decision Making  Multiple treatment options, significant modification of task necessary    Consulted and Agree with Plan of Care  Patient       Patient will benefit from skilled therapeutic intervention in order to improve the following deficits and impairments:  Abnormal gait, Pain, Decreased strength, Decreased range of motion, Impaired UE functional use, Decreased coordination, Decreased knowledge of use of DME, Decreased activity tolerance, Difficulty walking, Decreased balance, Other (comment)  Visit Diagnosis: Muscle weakness (generalized)  Other lack of coordination    Problem List Patient Active Problem List   Diagnosis Date Noted  . Pure hypercholesterolemia 05/28/2017  . Erectile dysfunction 05/28/2017  . Left hemiparesis (HCC)   . Essential hypertension 11/29/2016  . 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 Hepatitis C 07/22/2013    Olegario Messier, MS, OTR/L 06/23/2017, 2:30 PM  [place] [facility] MAIN [facility] SERVICES [address] [place], [place], [address] Phone: [phone]   Fax:  [phone]  Name: Jason Nolan MRN: [id number] Date of Birth:  [birth year]

## 2017-06-25 ENCOUNTER — Ambulatory Visit: Payer: Medicare Other | Admitting: Physical Therapy

## 2017-06-25 ENCOUNTER — Encounter: Payer: Self-pay | Admitting: Physical Therapy

## 2017-06-25 DIAGNOSIS — R2681 Unsteadiness on feet: Secondary | ICD-10-CM | POA: Diagnosis not present

## 2017-06-25 DIAGNOSIS — M6281 Muscle weakness (generalized): Secondary | ICD-10-CM | POA: Diagnosis not present

## 2017-06-25 DIAGNOSIS — R278 Other lack of coordination: Secondary | ICD-10-CM | POA: Diagnosis not present

## 2017-06-25 DIAGNOSIS — I639 Cerebral infarction, unspecified: Secondary | ICD-10-CM | POA: Diagnosis not present

## 2017-06-25 NOTE — Therapy (Signed)
[place] [facility] MAIN [facility] SERVICES [address] [place], [place], [address] Phone: [phone]   Fax:  [phone]  Physical Therapy Treatment  Patient Details  Name: Jason Nolan MRN: [id number] Date of Birth: 03/23/1946 Referring Provider: [name]   Encounter Date: 06/25/2017  PT End of Session - 06/25/17 1352    Visit Number  31    Number of Visits  71    Date for PT Re-Evaluation  09/10/17    PT Start Time  0105    PT Stop Time  0145    PT Time Calculation (min)  40 min    Equipment Utilized During Treatment  Gait belt    Activity Tolerance  Patient tolerated treatment well    Behavior During Therapy  [facility] for tasks assessed/performed       Past Medical History:  Diagnosis Date  . Glaucoma   . Hepatitis C   . Stroke [facility])     Past Surgical History:  Procedure Laterality Date  . IR ANGIO INTRA EXTRACRAN SEL COM CAROTID INNOMINATE UNI L MOD SED  11/25/2016  . IR ANGIO VERTEBRAL SEL SUBCLAVIAN INNOMINATE UNI R MOD SED  11/25/2016  . IR ANGIO VERTEBRAL SEL VERTEBRAL UNI L MOD SED  11/25/2016  . IR INTRAVSC STENT CERV CAROTID W/O EMB-PROT MOD SED INC ANGIO  11/25/2016  . IR PERCUTANEOUS ART THROMBECTOMY/INFUSION INTRACRANIAL INC DIAG ANGIO  11/25/2016  . IR RADIOLOGIST EVAL & MGMT  02/13/2017  . RADIOLOGY WITH ANESTHESIA N/A 11/25/2016   Procedure: RADIOLOGY WITH ANESTHESIA;  Surgeon: Radiologist, Medication, MD;  Location: [facility] OR;  Service: Radiology;  Laterality: N/A;    There were no vitals filed for this visit.  Subjective Assessment - 06/25/17 1351    Subjective  Patinet is doing fine today.No new concerns. Patient is doing his HEP; walking and doing his theraband exercises.    Pertinent History  Pt. is a 72 y.o. 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 Pt. was transferred to [facility]. He underwent CTA brain showed short segment near occlusion of proximal R-ICA with associated intraluminal  thrombus --likely due to dissection and emergent large vessel occlusion with right M2 occlusion and evolving right frontal lobe infarct.  He underwent cerebral angio with  complete revascularization of occluded superior division of R-MCA and near complete revascularization of proximal R-ICA with stent assisted angioplasty.  Stroke felt to be embolic in setting of ruptured plaque R-ICA and he was placed on ASA and brillinta due to stent. Pt. was transferred to inpatient rehab for several weeks, had home health therapy upon discharge.    Limitations  Walking;Lifting;House hold activities    How long can you sit comfortably?  unlimited     How long can you stand comfortably?  unlimited     How long can you walk comfortably?  unsure    Patient Stated Goals  "strengthen my whole L side"    Currently in Pain?  No/denies    Pain Score  0-No pain    Multiple Pain Sites  No       NMR:  Squats on BOSU ball flat side and round side x 10 x 2  Tandem stand on disk and left and right disk x 3 mins, shifting weight fwd/ bwd  Tandem stand on 1/2 foam and tapping stepping stones left and right  x 3 mins   Matrix fwd/ bwd x 5 and side stepping left and right x 10 ,  27. 5 lbs  Single leg stand with ball rotation left and right x 20   TM elevation 3, speed 1. 8 x 5 mins  Side stepping from disk to 6 inch step back to disk and then flat surface x 20 left and right   Patient needs cues for correct posture and correct technique                       PT Education - 06/25/17 1351    Education provided  Yes    Education Details  balance strategies    Person(s) Educated  Patient    Methods  Demonstration;Explanation;Tactile cues;Verbal cues    Comprehension  Verbalized understanding;Returned demonstration;Verbal cues required       PT Short Term Goals - 04/23/17 1442      PT SHORT TERM GOAL #1   Title  Pt will improve 5x sit to stand to 18 seconds in order to demonstrate  improved LE strength and improved dynamic balance.    Baseline  21 sec; 02/26/17: 16.1s    Time  6    Period  Weeks    Status  Achieved      PT SHORT TERM GOAL #2   Title  Pt will improve gait speed to at least 1.0 m/s with proper heel strike and decreased hip hike in order to demonstrate imrpoved LE strength and balance.    Baseline  0.83 m/s; 02/26/17: 1.11 m/s    Time  6    Period  Weeks    Status  Achieved      PT SHORT TERM GOAL #3   Title  Pt will improve Berg Balance score to 51/56 in order to demonstrate improved dynamic and static balance, decreasing overall falls risk.    Baseline  48/56; 02/26/17: 53/56    Time  6    Period  Weeks    Status  Achieved        PT Long Term Goals - 06/16/17 1308      PT LONG TERM GOAL #1   Title  Pt will demonstrate independence with HEP in order to continue LE strengthening and manage symptoms.    Time  12    Period  Weeks    Status  On-going    Target Date  08/11/17      PT LONG TERM GOAL #2   Title  Pt will improve 5x sit to stand to <15 seconds in order to demonstrate improved LE strength and dynamic balance.    Baseline  21 sec; 02/26/17: 16.1s 16.50 sec 04/23/17; 16 sec 05/26/17, 06/16/17 16 sec    Time  12    Period  Weeks    Status  On-going    Target Date  08/11/17      PT LONG TERM GOAL #3   Title  Pt will demonstrate gait speed of at least 1.2 m/s in order to demonstrate improved LE strength and improved balance in order to return to prior level of funciton.    Baseline  0.833 m/s 02/26/17: 1.11 m/s, 12/189/18 1.16 m/sec; 1.23 m/sec    Time  12    Period  Weeks    Status  Achieved    Target Date  06/16/17      PT LONG TERM GOAL #4   Title  Pt will improve Berg Balance score to at least 54/56 in order demonstrate improved dynamic and static balance, decreasing overall falls risk.    Baseline  48/56; 02/26/17: 53/56; 04/23/17  53/56    Time  12    Period  Weeks    Status  Achieved    Target Date  06/16/17      PT  LONG TERM GOAL #5   Title  Pt will demonstrate improved LE strength to 4+/5 in all limited planes in order to return to prior level of function.    Baseline  LLE hip flex 4/5, abd 4+/5, ext 4/5, LLE knee 5/5, ankle 5/5    Time  12    Period  Weeks    Status  On-going    Target Date  08/11/17      Additional Long Term Goals   Additional Long Term Goals  Yes      PT LONG TERM GOAL #6   Title  Pt will improve to >1500 feet without using AD in order to return to prior level of function, demonstrating improvements in LE strength, balance and endurance.    Baseline  1205 ft without AD; 02/26/17: deferred to next session 04/23/17 1420 ft, 1525 feet    Time  12    Period  Weeks    Status  On-going    Target Date  08/11/17      PT LONG TERM GOAL #7   Title  Patient will perform TM walking elevated . 3 with increased speed and no loss of balance for 5 mins to be able to walk a golf  course.    Time  12    Period  Weeks    Status  New    Target Date  08/11/17            Plan - 06/25/17 1352    Clinical Impression Statement  Patient demonstrates decreased dynamic standing balance and requires verbal and tactile cueing to maintain center of gravity during dynamic balance activities. Patient also requires CGA during all dynamic standing balance activities. Patient requires consistent cueing to maintain correct position during balance activities and tandem stand on  foam . Patient demonstrates difficulty with dynamic standing balance and increased postural sway while on  foam. Patient will continue to benefit from skilled therapy in order to improve dynamic standing balance and increase endurance    Rehab Potential  Good    Clinical Impairments Affecting Rehab Potential  high prior level of function    PT Frequency  2x / week    PT Duration  12 weeks    PT Treatment/Interventions  ADLs/Self Care Home Management;Cryotherapy;Moist Heat;Gait training;Stair training;Functional mobility  training;Therapeutic activities;Therapeutic exercise;Balance training;Neuromuscular re-education;Patient/family education;Manual techniques    PT Next Visit Plan  , MMT, update those specific goals, continue strength/balance;    PT Home Exercise Plan  Add green theraband to HEP exercises    Consulted and Agree with Plan of Care  Patient       Patient will benefit from skilled therapeutic intervention in order to improve the following deficits and impairments:  Abnormal gait, Decreased balance, Decreased strength, Difficulty walking, Increased edema  Visit Diagnosis: Muscle weakness (generalized)  Other lack of coordination  Unsteadiness on feet     Problem List Patient Active Problem List   Diagnosis Date Noted  . Pure hypercholesterolemia 05/28/2017  . Erectile dysfunction 05/28/2017  . Left hemiparesis (HCC)   . Essential hypertension 11/29/2016  . 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 Hepatitis C  07/22/2013    Ezekiel InaMansfield, Kha Hari S, PT DPT 06/25/2017, 1:54 PM  [facility] MAIN [facility] SERVICES [address] [place], [place], [address] Phone: [phone]   Fax:  [phone]  Name: Jason Nolan MRN: [id number] Date of Birth: [birth year]

## 2017-06-27 ENCOUNTER — Inpatient Hospital Stay: Payer: Medicare Other

## 2017-06-27 ENCOUNTER — Encounter: Payer: Self-pay | Admitting: Hematology and Oncology

## 2017-06-27 ENCOUNTER — Other Ambulatory Visit: Payer: Self-pay

## 2017-06-27 ENCOUNTER — Inpatient Hospital Stay (HOSPITAL_BASED_OUTPATIENT_CLINIC_OR_DEPARTMENT_OTHER): Payer: Medicare Other | Admitting: Hematology and Oncology

## 2017-06-27 VITALS — BP 119/74 | HR 62 | Temp 95.0°F | Resp 18 | Wt 202.8 lb

## 2017-06-27 DIAGNOSIS — D72819 Decreased white blood cell count, unspecified: Secondary | ICD-10-CM

## 2017-06-27 DIAGNOSIS — E538 Deficiency of other specified B group vitamins: Secondary | ICD-10-CM

## 2017-06-27 DIAGNOSIS — R768 Other specified abnormal immunological findings in serum: Secondary | ICD-10-CM | POA: Insufficient documentation

## 2017-06-27 DIAGNOSIS — Z8619 Personal history of other infectious and parasitic diseases: Secondary | ICD-10-CM | POA: Diagnosis not present

## 2017-06-27 DIAGNOSIS — Z79899 Other long term (current) drug therapy: Secondary | ICD-10-CM | POA: Diagnosis not present

## 2017-06-27 DIAGNOSIS — B182 Chronic viral hepatitis C: Secondary | ICD-10-CM | POA: Diagnosis not present

## 2017-06-27 DIAGNOSIS — D72818 Other decreased white blood cell count: Secondary | ICD-10-CM | POA: Diagnosis not present

## 2017-06-27 DIAGNOSIS — Z87891 Personal history of nicotine dependence: Secondary | ICD-10-CM | POA: Diagnosis not present

## 2017-06-27 NOTE — Progress Notes (Signed)
Here for follow up -pt stated overall " doing good "

## 2017-06-27 NOTE — Progress Notes (Signed)
[facility]-  Cancer Center  Clinic day:  06/27/2017  Chief Complaint: Jason Nolan is a 72 y.o. male with leukopenia who is seen for review of work-up and discussion regarding direction of therapy.  HPI:  The patient was last seen in the hematology clinic on 06/20/2017 for initial consultation.  He was noted to have chronic leukopenia dating back to 2013.  He has a history of hepatitis C treated with Harvoni.  He denied any issues with infections.  He denied any symptoms.  Work-up revealed a hematocrit of 44.6, hemoglobin 14.8, MCV 92.1, platelets 202,000, WBC 3000 with an ANC of 1500.  Differential was unremarkable.  ANA was negative.  Hepatitis C virus RNA by PCR was negative.  HIV testing was negative.  Hepatitis B core antibody was positive. Copper was normal.  Folate was normal.  B12 was low normal (271).  Symptomatically, patient is doing "just fine". He denies any acute concerns today. Patient denies recent infections. Patient feels generally well.  He denies fevers, sweats, and weight loss. Patient eating well. His remains stable.  He denies pain in the clinic.    Past Medical History:  Diagnosis Date  . Glaucoma   . Hepatitis C   . Stroke [facility])     Past Surgical History:  Procedure Laterality Date  . IR ANGIO INTRA EXTRACRAN SEL COM CAROTID INNOMINATE UNI L MOD SED  11/25/2016  . IR ANGIO VERTEBRAL SEL SUBCLAVIAN INNOMINATE UNI R MOD SED  11/25/2016  . IR ANGIO VERTEBRAL SEL VERTEBRAL UNI L MOD SED  11/25/2016  . IR INTRAVSC STENT CERV CAROTID W/O EMB-PROT MOD SED INC ANGIO  11/25/2016  . IR PERCUTANEOUS ART THROMBECTOMY/INFUSION INTRACRANIAL INC DIAG ANGIO  11/25/2016  . IR RADIOLOGIST EVAL & MGMT  02/13/2017  . RADIOLOGY WITH ANESTHESIA N/A 11/25/2016   Procedure: RADIOLOGY WITH ANESTHESIA;  Surgeon: Radiologist, Medication, MD;  Location: [facility] OR;  Service: Radiology;  Laterality: N/A;    Family History  Problem Relation Age of Onset  . Hypertension Mother    . Hypertension Father     Social History:  reports that he quit smoking about 29 years ago. He has a 7.50 pack-year smoking history. he has never used smokeless tobacco. He reports that he does not drink alcohol or use drugs.  Patient does not smoke or drink alcohol. Patient originally from [place]. He moved to [place] in 2010. 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 The patient is alone today.   Allergies: No Known Allergies  Current Medications: Current Outpatient Medications  Medication Sig Dispense Refill  . amLODipine (NORVASC) 10 MG tablet TAKE 1 TABLET DAILY 90 tablet 0  . aspirin 81 MG chewable tablet Chew 1 tablet (81 mg total) by mouth daily.    Marland Kitchen atorvastatin (LIPITOR) 40 MG tablet TAKE 1 TABLET DAILY AT 6 P.M. 90 tablet 0  . BRILINTA 90 MG TABS tablet TAKE 1 TABLET TWICE A DAY 180 tablet 0  . dorzolamide-timolol (COSOPT) 22.3-6.8 MG/ML ophthalmic solution     . PAZEO 0.7 % SOLN Place 1 drop into both eyes daily.     . timolol (TIMOPTIC) 0.5 % ophthalmic solution Place 1 drop into the right eye daily.     Marland Kitchen VYZULTA 0.024 % SOLN Apply 1 drop to eye at bedtime.      No current facility-administered medications for this visit.     Review of Systems:  GENERAL:  Feels "fine".  No fevers, sweats or weight loss. PERFORMANCE STATUS (ECOG):  0 HEENT:  No visual changes, runny nose, sore throat, mouth sores or tenderness.  Needs wisdom tooth removed. Lungs: No shortness of breath or cough.  No hemoptysis. Cardiac:  No chest pain, palpitations, orthopnea, or PND. GI:  Constipation at times.  No nausea, vomiting, diarrhea, melena or hematochezia. GU:  No urgency, frequency, dysuria, or hematuria. Musculoskeletal:  No back pain.  No joint pain.  No muscle tenderness. Extremities:  No pain or swelling. Skin:  No rashes or skin changes. Neuro:  No headache, numbness or weakness, balance or coordination issues. Endocrine:  No diabetes, thyroid issues, hot flashes or night sweats. Psych:   No mood changes, depression or anxiety. Pain:  No focal pain. Review of systems:  All other systems reviewed and found to be negative.  Physical Exam: Blood pressure 119/74, pulse 62, temperature (!) 95 F (35 C), temperature source Tympanic, resp. rate 18, weight 202 lb 12.8 oz (92 kg). GENERAL:  Well developed, well nourished, gentleman sitting comfortably in the exam room in no acute distress. MENTAL STATUS:  Alert and oriented to person, place and time. HEAD:  Wearing a cap.  Goatee.  Normocephalic, atraumatic, face symmetric, no Cushingoid features. EYES:  Glasses. Brown eyes. No conjunctivitis or scleral icterus. NEUROLOGICAL: Unremarkable. PSYCH:  Appropriate.   No visits with results within 3 Day(s) from this visit.  Latest known visit with results is:  Appointment on 06/20/2017  Component Date Value Ref Range Status  . Anit Nuclear Antibody(ANA) 06/20/2017 Negative  Negative Final   Comment: (NOTE) Performed At: [facility] [address] [place] [employer], [place] [id number] Jolene Schimke MD [id number] Performed at [facility], [address] [place], [place] [address]   . HIV Screen 4th Generation wRfx 06/20/2017 Non Reactive  Non Reactive Final   Comment: (NOTE) Performed At: [facility] [address] [place], [place] [id number] Jolene Schimke MD [id number] Performed at [facility], [address], [place], [place] [address]   . Hepatitis C Vrs RNA by PCR-Qual 06/20/2017 Negative  Negative Final   Comment: (NOTE) Negative: HCV RNA Not Detected Performed At: [facility] [address] [place], [place] [id number] Jolene Schimke MD [id number] Performed at [facility], [address] [place], [place] [address]   . Hep B Core Total Ab 06/20/2017 Positive* Negative Final   Comment: (NOTE) Performed At: [facility] [address] [place], [place] [id number] Jolene Schimke MD [id number] Performed at  [facility], [address], [place], [place] [address]   . Vitamin B-12 06/20/2017 271  180 - 914 pg/mL Final   Comment: (NOTE) This assay is not validated for testing neonatal or myeloproliferative syndrome specimens for Vitamin B12 levels. Performed at [facility], [address] [place], [place] [address]   . Copper 06/20/2017 108  72 - 166 ug/dL Final   Comment: (NOTE) This test was developed and its performance characteristics determined by [facility]. It has not been cleared or approved by the Food and Drug Administration.                                Detection Limit = 5 Performed At: [facility] [address] [place], [place] [id number] Jolene Schimke MD [id number] Performed at [facility], [address], [place], [place] [address]   . Folate 06/20/2017 9.4  >5.9 ng/mL Final   Performed at [facility], [address] [place],  [place] [id number]  . WBC 06/20/2017 3.0* 3.8 - 10.6 K/uL Final  . RBC 06/20/2017 4.84  4.40 - 5.90 MIL/uL Final  . Hemoglobin 06/20/2017 14.8  13.0 - 18.0 g/dL Final  . HCT 91/47/829502/15/2019 44.6  40.0 - 52.0 % Final  . MCV 06/20/2017 92.1  80.0 - 100.0 fL Final  . MCH 06/20/2017 30.7  26.0 - 34.0 pg Final  . MCHC 06/20/2017 33.3  32.0 - 36.0 g/dL Final  . RDW 62/13/086502/15/2019 14.3  11.5 - 14.5 % Final  . Platelets 06/20/2017 202  150 - 440 K/uL Final  . Neutrophils Relative % 06/20/2017 51  % Final  . Neutro Abs 06/20/2017 1.5  1.4 - 6.5 K/uL Final  . Lymphocytes Relative 06/20/2017 36  % Final  . Lymphs Abs 06/20/2017 1.1  1.0 - 3.6 K/uL Final  . Monocytes Relative 06/20/2017 8  % Final  . Monocytes Absolute 06/20/2017 0.2  0.2 - 1.0 K/uL Final  . Eosinophils Relative 06/20/2017 4  % Final  . Eosinophils Absolute 06/20/2017 0.1  0 - 0.7 K/uL Final  . Basophils Relative 06/20/2017 1  % Final  . Basophils Absolute 06/20/2017 0.0  0 - 0.1 K/uL Final   Performed at [facility], [address]  [place], [place] [id number]    Assessment:  Jason CroakJames Nolan is a 72 y.o. 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  WBC has ranged between 2400 - 4900.  ANC was 2900 on 11/26/2016.  Diet is good.  He is on no new medications or herbal products.  CBC on 05/28/2017 included a hematocrit of 44.6, hemoglobin 15, MCV 92, platelets 201,000 and white count 2900.  Creatinine was 0.91 and LFTs were normal.  Work-up on 06/20/2017 revealed a hematocrit of 44.6, hemoglobin 14.8, MCV 92.1, platelets 202,000, WBC 3000 with an ANC of 1500.  Differential was unremarkable.  Normal studies included:  copper level, folate, ANA, hepatitis C virus RNA by PCR, HIV testing.  Hepatitis B core antibody was positive.  B12 was low normal (271).  He has a history of hepatitis C, genotype 1b.  He was previously treated with PEG-Interferon/RBV in 2009-2010.  His viral load never became undetectable and treatment was discontinued.  He was treated with Harvoni.  Hepatitis C RNA by PCR was undetectable in 12/2015.    Symptomatically, he denies any complaints.  He denies any issues with infections.  Exam reveals no adenopathy or hepatosplenomegaly.  Plan: 1.  Discuss work-up.  Testing reveals + hepatitis B core antibody (prior infection).  Hepatitis B infection can lead to prolonged leukopenia typically associated with cirrhosis and splenomegaly.  Discuss low B12 level possibly indicative of deficiency.  Check MMA.  Supplement B12. 2.  Labs today: MMA, AFP 3.  Schedule abdominal ultrasound to assess liver and spleen.  4.  Discuss neutropenia precautions. Patient to call clinic if he develops any febrile illness symptoms.  5.  RTC after ultrasound for MD assessment and review of workup and discussion regarding direction of therapy.    Quentin MullingBryan Gray, NP  06/27/2017, 2:19 PM   I saw and evaluated the patient, participating in the key portions of the service and reviewing pertinent diagnostic studies and records.  I reviewed  the nurse practitioner's note and agree with the findings and the plan.  The assessment and plan were discussed with the patient.  Several questions were asked by the patient and answered.   Nelva NayMelissa Corcoran, MD 06/27/2017, 2:19 PM

## 2017-06-28 LAB — AFP TUMOR MARKER: AFP, Serum, Tumor Marker: 2.2 ng/mL (ref 0.0–8.3)

## 2017-06-30 ENCOUNTER — Encounter: Payer: Self-pay | Admitting: Physical Therapy

## 2017-06-30 ENCOUNTER — Ambulatory Visit: Payer: Medicare Other | Admitting: Physical Therapy

## 2017-06-30 ENCOUNTER — Ambulatory Visit: Payer: Medicare Other | Admitting: Occupational Therapy

## 2017-06-30 ENCOUNTER — Encounter: Payer: Self-pay | Admitting: Occupational Therapy

## 2017-06-30 DIAGNOSIS — M6281 Muscle weakness (generalized): Secondary | ICD-10-CM | POA: Diagnosis not present

## 2017-06-30 DIAGNOSIS — R2681 Unsteadiness on feet: Secondary | ICD-10-CM

## 2017-06-30 DIAGNOSIS — R278 Other lack of coordination: Secondary | ICD-10-CM

## 2017-06-30 DIAGNOSIS — I639 Cerebral infarction, unspecified: Secondary | ICD-10-CM | POA: Diagnosis not present

## 2017-06-30 NOTE — Therapy (Signed)
[place] [facility]IN [facility] [address] [place], [place], [id number] Phone: [phone]   Fax:  [phone]  Physical Therapy Treatment  Patient Details  Name: Jason CroakJames Nolan MRN: [id number] Date of Birth: 1945/09/16 Referring Provider: Lorre MunroeBAITY, REGINA W   Encounter Date: 06/30/2017  PT End of Session - 06/30/17 1349    Visit Number  32    Number of Visits  71    Date for PT Re-Evaluation  09/10/17    PT Start Time  0102    PT Stop Time  0145    PT Time Calculation (min)  43 min    Equipment Utilized During Treatment  Gait belt    Activity Tolerance  Patient tolerated treatment well    Behavior During Therapy  [facility] for tasks assessed/performed       Past Medical History:  Diagnosis Date  . Glaucoma   . Hepatitis C   . Stroke [facility](HCC)     Past Surgical History:  Procedure Laterality Date  . IR ANGIO INTRA EXTRACRAN SEL COM CAROTID INNOMINATE UNI L MOD SED  11/25/2016  . IR ANGIO VERTEBRAL SEL SUBCLAVIAN INNOMINATE UNI R MOD SED  11/25/2016  . IR ANGIO VERTEBRAL SEL VERTEBRAL UNI L MOD SED  11/25/2016  . IR INTRAVSC STENT CERV CAROTID W/O EMB-PROT MOD SED INC ANGIO  11/25/2016  . IR PERCUTANEOUS ART THROMBECTOMY/INFUSION INTRACRANIAL INC DIAG ANGIO  11/25/2016  . IR RADIOLOGIST EVAL & MGMT  02/13/2017  . RADIOLOGY WITH ANESTHESIA N/A 11/25/2016   Procedure: RADIOLOGY WITH ANESTHESIA;  Surgeon: Radiologist, Medication, MD;  Location: [facility] OR;  Service: Radiology;  Laterality: N/A;    There were no vitals filed for this visit.  Subjective Assessment - 06/30/17 1344    Subjective  Patinet is doing fine today.No new concerns. Patient is doing his HEP; walking and doing his theraband exercises.    Pertinent History  Pt. is a 72 y.o. 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 [facility]. He underwent CTA brain showed short segment near occlusion of proximal R-ICA with associated intraluminal  thrombus --likely due to dissection and emergent large vessel occlusion with right M2 occlusion and evolving right frontal lobe infarct.  He underwent cerebral angio with  complete revascularization of occluded superior division of R-MCA and near complete revascularization of proximal R-ICA with stent assisted angioplasty.  Stroke felt to be embolic in setting of ruptured plaque R-ICA and he was placed on ASA and brillinta due to stent. Pt. was transferred to inpatient rehab for several weeks, had home health therapy upon discharge.    Limitations  Walking;Lifting;House hold activities    How long can you sit comfortably?  unlimited     How long can you stand comfortably?  unlimited     How long can you walk comfortably?  unsure    Patient Stated Goals  "strengthen my whole L side"       Treatment: TM side stepping elevation 3, speed . 7 x 3 mins left and 3 mins right 1/2 foam feet side by side , and feet tandem with 2 lbs horizontal fwd/bwd and flex/ext x 8 mins Purple foam single leg tapping up 2 steps and cross overs x 20 RLE and x 20 LLE  Matrix 27. 5 lbs fwd over 6 inch step and bwd over 6 inch step x 5 each direction  PT Education - 06/30/17 1344    Education provided  Yes    Education Details  balance strageties    Person(s) Educated  Patient    Methods  Explanation;Demonstration    Comprehension  Verbalized understanding;Returned demonstration       PT Short Term Goals - 04/23/17 1442      PT SHORT TERM GOAL #1   Title  Pt will improve 5x sit to stand to 18 seconds in order to demonstrate improved LE strength and improved dynamic balance.    Baseline  21 sec; 02/26/17: 16.1s    Time  6    Period  Weeks    Status  Achieved      PT SHORT TERM GOAL #2   Title  Pt will improve gait speed to at least 1.0 m/s with proper heel strike and decreased hip hike in order to demonstrate imrpoved LE strength and balance.    Baseline  0.83 m/s;  02/26/17: 1.11 m/s    Time  6    Period  Weeks    Status  Achieved      PT SHORT TERM GOAL #3   Title  Pt will improve Berg Balance score to 51/56 in order to demonstrate improved dynamic and static balance, decreasing overall falls risk.    Baseline  48/56; 02/26/17: 53/56    Time  6    Period  Weeks    Status  Achieved        PT Long Term Goals - 06/16/17 1308      PT LONG TERM GOAL #1   Title  Pt will demonstrate independence with HEP in order to continue LE strengthening and manage symptoms.    Time  12    Period  Weeks    Status  On-going    Target Date  08/11/17      PT LONG TERM GOAL #2   Title  Pt will improve 5x sit to stand to <15 seconds in order to demonstrate improved LE strength and dynamic balance.    Baseline  21 sec; 02/26/17: 16.1s 16.50 sec 04/23/17; 16 sec 05/26/17, 06/16/17 16 sec    Time  12    Period  Weeks    Status  On-going    Target Date  08/11/17      PT LONG TERM GOAL #3   Title  Pt will demonstrate gait speed of at least 1.2 m/s in order to demonstrate improved LE strength and improved balance in order to return to prior level of funciton.    Baseline  0.833 m/s 02/26/17: 1.11 m/s, 12/189/18 1.16 m/sec; 1.23 m/sec    Time  12    Period  Weeks    Status  Achieved    Target Date  06/16/17      PT LONG TERM GOAL #4   Title  Pt will improve Berg Balance score to at least 54/56 in order demonstrate improved dynamic and static balance, decreasing overall falls risk.    Baseline  48/56; 02/26/17: 53/56; 04/23/17  53/56    Time  12    Period  Weeks    Status  Achieved    Target Date  06/16/17      PT LONG TERM GOAL #5   Title  Pt will demonstrate improved LE strength to 4+/5 in all limited planes in order to return to prior level of function.    Baseline  LLE hip flex 4/5, abd 4+/5, ext 4/5, LLE knee 5/5, ankle 5/5  Time  12    Period  Weeks    Status  On-going    Target Date  08/11/17      Additional Long Term Goals   Additional Long  Term Goals  Yes      PT LONG TERM GOAL #6   Title  Pt will improve to >1500 feet without using AD in order to return to prior level of function, demonstrating improvements in LE strength, balance and endurance.    Baseline  1205 ft without AD; 02/26/17: deferred to next session 04/23/17 1420 ft, 1525 feet    Time  12    Period  Weeks    Status  On-going    Target Date  08/11/17      PT LONG TERM GOAL #7   Title  Patient will perform TM walking elevated . 3 with increased speed and no loss of balance for 5 mins to be able to walk a golf  course.    Time  12    Period  Weeks    Status  New    Target Date  08/11/17            Plan - 06/30/17 1351    Clinical Impression Statement  Patient performs dynamic standing balance challenges with decreased base of support and increased resistance forces to improve balance strageties. He is able to perform these challenges with 50% loss of balance and CGA. Patient will continue to benefit from skilled PT to improve balance and strength.     Rehab Potential  Good    Clinical Impairments Affecting Rehab Potential  high prior level of function    PT Frequency  2x / week    PT Duration  12 weeks    PT Treatment/Interventions  ADLs/Self Care Home Management;Cryotherapy;Moist Heat;Gait training;Stair training;Functional mobility training;Therapeutic activities;Therapeutic exercise;Balance training;Neuromuscular re-education;Patient/family education;Manual techniques    PT Next Visit Plan  , MMT, update those specific goals, continue strength/balance;    PT Home Exercise Plan  Add green theraband to HEP exercises    Consulted and Agree with Plan of Care  Patient       Patient will benefit from skilled therapeutic intervention in order to improve the following deficits and impairments:  Abnormal gait, Decreased balance, Decreased strength, Difficulty walking, Increased edema  Visit Diagnosis: Muscle weakness (generalized)  Other lack of  coordination  Unsteadiness on feet  Acute ischemic stroke (HCC) - 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 Low vitamin B12 level 06/27/2017  . 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 Pure hypercholesterolemia 05/28/2017  . Erectile dysfunction 05/28/2017  . Left hemiparesis (HCC)   . Essential hypertension 11/29/2016  . 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 Leukopenia 03/31/2016  . Hepatitis C 07/22/2013    Ezekiel Ina, [place] DPT 06/30/2017, 1:56 PM  [place] [facility] MAIN [facility] SERVICES [address] [place], [place], [address] Phone: [phone]   Fax:  [phone]  Name: Jason Nolan MRN: [id number] Date of Birth: [birth year]

## 2017-06-30 NOTE — Therapy (Signed)
[place] [facility] MAIN [facility] SERVICES [address] [place], [place], [address] Phone: [phone]   Fax:  [phone]  Occupational Therapy Treatment  Patient Details  Name: Jason Nolan MRN: [id number] Date of Birth: Dec 31, 1945 Referring Provider: [name]   Encounter Date: 06/30/2017  OT End of Session - 06/30/17 1402    Visit Number  28    Number of Visits  48    Date for OT Re-Evaluation  07/09/17    OT Start Time  1345    OT Stop Time  1430    OT Time Calculation (min)  45 min    Activity Tolerance  Patient tolerated treatment well    Behavior During Therapy  [facility] for tasks assessed/performed       Past Medical History:  Diagnosis Date  . Glaucoma   . Hepatitis C   . Stroke [facility])     Past Surgical History:  Procedure Laterality Date  . IR ANGIO INTRA EXTRACRAN SEL COM CAROTID INNOMINATE UNI L MOD SED  11/25/2016  . IR ANGIO VERTEBRAL SEL SUBCLAVIAN INNOMINATE UNI R MOD SED  11/25/2016  . IR ANGIO VERTEBRAL SEL VERTEBRAL UNI L MOD SED  11/25/2016  . IR INTRAVSC STENT CERV CAROTID W/O EMB-PROT MOD SED INC ANGIO  11/25/2016  . IR PERCUTANEOUS ART THROMBECTOMY/INFUSION INTRACRANIAL INC DIAG ANGIO  11/25/2016  . IR RADIOLOGIST EVAL & MGMT  02/13/2017  . RADIOLOGY WITH ANESTHESIA N/A 11/25/2016   Procedure: RADIOLOGY WITH ANESTHESIA;  Surgeon: Radiologist, Medication, MD;  Location: [facility] OR;  Service: Radiology;  Laterality: N/A;    There were no vitals filed for this visit.  Subjective Assessment - 06/30/17 1356    Subjective   Pt. reports his right thumb IP is sore.    Patient is accompained by:  Family member    Pertinent History  Pt. is a 73 y.o. 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 Pt. was transferred to [facility]. He underwent CTA brain showed short segment near occlusion of proximal R-ICA with associated intraluminal thrombus --likely due to dissection and emergent large vessel occlusion with right M2 occlusion  and evolving right frontal lobe infarct.  He underwent cerebral angio with  complete revascularization of occluded superior division of R-MCA and near complete revascularization of proximal R-ICA with stent assisted angioplasty.  Stroke felt to be embolic in setting of ruptured plaque R-ICA and he was placed on ASA and brillinta due to stent. Pt. was transferred to inpatient rehab for several weeks, had home health therapy upon discharge, and now is ready for outpatient therapy services.    Currently in Pain?  No/denies       OT TREATMENT    Neuro muscular re-education:  Pt. worked on grasping 1" resistive cubes alternating thumb opposition to the tip of the 2nd digit while the board is placed at a vertical angle. Pt. worked on pressing the cubes back into place while alternating isolated 2nd through 5th digit extension. Pt. worked on grasping, and flipping 2" large pegs on the [employer] placed at a tabletop surface. Pt. Had difficulty performing translatory movements of the hand. Pt. Worked on translatory movements of the hand with 1" sticky ball.  Therapeutic Exercise:  Pt. performed gross gripping with grip strengthener. Pt. worked on sustaining grip while grasping pegs and reaching at various heights. Gripper was placed in the 4th resistive slot with the white resistive spring. Pt. Worked on pinch strengthening in the left hand for lateral,  and 3pt. pinch using  red, green resistive clips. Pt. worked on placing the clips at various vertical and horizontal angles. Tactile and verbal cues were required for eliciting the desired movement.                          OT Education - 06/30/17 1401    Education provided  Yes    Education Details  UE stregnth, and coordination skills.    Person(s) Educated  Patient    Methods  Explanation;Demonstration    Comprehension  Verbalized understanding          OT Long Term Goals - 04/16/17 1633      OT LONG TERM GOAL #1    Title  Pt. will improve edema by 2 cm through MPs in preparation for ROM.    Baseline  improving    Time  12    Period  Weeks    Status  On-going    Target Date  07/09/17      OT LONG TERM GOAL #2   Title  Pt. will improve Left hand digit extension in preparation for weightbearing, and releasing objects during ADLs/IADLs.    Baseline  Pt. is unable to achieve full digit extension on the left.    Time  12    Period  Weeks    Status  On-going    Target Date  07/09/17      OT LONG TERM GOAL #3   Title  Pt. left hand grip with  increase by 5# in preparation for holding a cup.    Baseline  Left 2#, RIght: 60    Time  12    Period  Weeks    Status  On-going    Target Date  07/09/17      OT LONG TERM GOAL #4   Title  Pt. will increased right shoulder strength by 1 mm grade to assist with ADLs.    Baseline  Impaired LUE strength    Time  12    Period  Weeks    Target Date  07/09/17      OT LONG TERM GOAL #5   Title  Pt. will increase left wrist extension in preparation for reaching ADL items.    Baseline  wrist extension 28 degrees    Time  12    Period  Weeks    Status  On-going    Target Date  07/09/17      OT LONG TERM GOAL #6   Title  Pt. will improve left hand coordination skills to be able to button clothing.    Baseline  Pt. conitnues to have difficulty    Time  12    Period  Weeks    Status  On-going    Target Date  07/09/17      OT LONG TERM GOAL #7   Title  Pt. will independently demonstrate visual compensatory strategies duirng ADLs, and IADLs.    Baseline  Glaucoma    Time  12    Period  Weeks    Status  On-going    Target Date  07/09/17            Plan - 06/30/17 1402    Occupational performance deficits (Please refer to evaluation for details):  ADL's;IADL's    Rehab Potential  Excellent    OT Frequency  2x / week    OT Duration  12 weeks    OT Treatment/Interventions  Self-care/ADL training;Therapeutic exercise;Neuromuscular  education;Patient/family education;Energy conservation;Therapeutic activities;DME and/or AE instruction    Clinical Decision Making  Multiple treatment options, significant modification of task necessary    Consulted and Agree with Plan of Care  Patient       Patient will benefit from skilled therapeutic intervention in order to improve the following deficits and impairments:  Abnormal gait, Pain, Decreased strength, Decreased range of motion, Impaired UE functional use, Decreased coordination, Decreased knowledge of use of DME, Decreased activity tolerance, Difficulty walking, Decreased balance, Other (comment)  Visit Diagnosis: Muscle weakness (generalized)  Other lack of coordination    Problem List Patient Active Problem List   Diagnosis Date Noted  . Low vitamin B12 level 06/27/2017  . 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 Pure hypercholesterolemia 05/28/2017  . Erectile dysfunction 05/28/2017  . Left hemiparesis (HCC)   . Essential hypertension 11/29/2016  . 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 Leukopenia 03/31/2016  . Hepatitis C 07/22/2013    Olegario Messier, MS, OTR/L 06/30/2017, 2:19 PM  [place] [facility] MAIN [facility] SERVICES [address] [place], [place], [address] Phone: [phone]   Fax:  [phone]  Name: Jason Nolan MRN: [id number] Date of Birth: Feb 17, 1946

## 2017-07-01 ENCOUNTER — Ambulatory Visit
Admission: RE | Admit: 2017-07-01 | Discharge: 2017-07-01 | Disposition: A | Discharge status: Home / Self Care | Payer: Medicare Other | Source: Ambulatory Visit | Attending: Urgent Care | Admitting: Urgent Care

## 2017-07-01 DIAGNOSIS — N281 Cyst of kidney, acquired: Secondary | ICD-10-CM | POA: Insufficient documentation

## 2017-07-01 DIAGNOSIS — D72819 Decreased white blood cell count, unspecified: Secondary | ICD-10-CM

## 2017-07-01 DIAGNOSIS — B1910 Unspecified viral hepatitis B without hepatic coma: Secondary | ICD-10-CM | POA: Diagnosis not present

## 2017-07-01 DIAGNOSIS — R768 Other specified abnormal immunological findings in serum: Secondary | ICD-10-CM | POA: Insufficient documentation

## 2017-07-01 LAB — METHYLMALONIC ACID, SERUM: METHYLMALONIC ACID, QUANTITATIVE: 89 nmol/L (ref 0–378)

## 2017-07-02 ENCOUNTER — Ambulatory Visit: Payer: Medicare Other | Admitting: Physical Therapy

## 2017-07-02 ENCOUNTER — Ambulatory Visit: Payer: Medicare Other | Admitting: Occupational Therapy

## 2017-07-02 ENCOUNTER — Encounter: Payer: Self-pay | Admitting: Physical Therapy

## 2017-07-02 DIAGNOSIS — I639 Cerebral infarction, unspecified: Secondary | ICD-10-CM

## 2017-07-02 DIAGNOSIS — R2681 Unsteadiness on feet: Secondary | ICD-10-CM | POA: Diagnosis not present

## 2017-07-02 DIAGNOSIS — M6281 Muscle weakness (generalized): Secondary | ICD-10-CM

## 2017-07-02 DIAGNOSIS — R278 Other lack of coordination: Secondary | ICD-10-CM

## 2017-07-02 NOTE — Therapy (Signed)
[place] [facility] MAIN [facility] SERVICES [address] [place], [place], [address] Phone: [phone]   Fax:  [phone]  Physical Therapy Treatment  Patient Details  Name: Jason Nolan MRN: [id number] Date of Birth: 12/31/1945 Referring Provider: [name]   Encounter Date: 07/02/2017  PT End of Session - 07/02/17 1307    Visit Number  33    Number of Visits  71    Date for PT Re-Evaluation  09/10/17    PT Start Time  0102    PT Stop Time  0145    PT Time Calculation (min)  43 min    Equipment Utilized During Treatment  Gait belt    Activity Tolerance  Patient tolerated treatment well    Behavior During Therapy  [facility] for tasks assessed/performed       Past Medical History:  Diagnosis Date  . Glaucoma   . Hepatitis C   . Stroke [facility])     Past Surgical History:  Procedure Laterality Date  . IR ANGIO INTRA EXTRACRAN SEL COM CAROTID INNOMINATE UNI L MOD SED  11/25/2016  . IR ANGIO VERTEBRAL SEL SUBCLAVIAN INNOMINATE UNI R MOD SED  11/25/2016  . IR ANGIO VERTEBRAL SEL VERTEBRAL UNI L MOD SED  11/25/2016  . IR INTRAVSC STENT CERV CAROTID W/O EMB-PROT MOD SED INC ANGIO  11/25/2016  . IR PERCUTANEOUS ART THROMBECTOMY/INFUSION INTRACRANIAL INC DIAG ANGIO  11/25/2016  . IR RADIOLOGIST EVAL & MGMT  02/13/2017  . RADIOLOGY WITH ANESTHESIA N/A 11/25/2016   Procedure: RADIOLOGY WITH ANESTHESIA;  Surgeon: Radiologist, Medication, MD;  Location: [facility] OR;  Service: Radiology;  Laterality: N/A;    There were no vitals filed for this visit.  Subjective Assessment - 07/02/17 1306    Subjective  Patinet is doing fine today.No new concerns. Patient is doing his HEP; walking and doing his theraband exercises.    Pertinent History  Pt. is a 72 y.o. 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 Pt. was transferred to [facility]. He underwent CTA brain showed short segment near occlusion of proximal R-ICA with associated intraluminal  thrombus --likely due to dissection and emergent large vessel occlusion with right M2 occlusion and evolving right frontal lobe infarct.  He underwent cerebral angio with  complete revascularization of occluded superior division of R-MCA and near complete revascularization of proximal R-ICA with stent assisted angioplasty.  Stroke felt to be embolic in setting of ruptured plaque R-ICA and he was placed on ASA and brillinta due to stent. Pt. was transferred to inpatient rehab for several weeks, had home health therapy upon discharge.    Limitations  Walking;Lifting;House hold activities    How long can you sit comfortably?  unlimited     How long can you stand comfortably?  unlimited     How long can you walk comfortably?  unsure    Patient Stated Goals  "strengthen my whole L side"    Currently in Pain?  No/denies    Pain Score  0-No pain    Multiple Pain Sites  No       Neuromuscular : Treatment: TM side stepping elevation 3, speed . 7 x 3 mins left and 3 mins right 1/2 foam feet side by side , and feet tandem with 2 lbs horizontal fwd/bwd and flex/ext x 8 mins Purple foam single leg tapping up 2 steps and cross overs x 20 RLE and x 20 LLE bosu ball flat side up and flat side  down,  partial squat with Rtheraball and BUE horizontal abd/add, diagonal chops left and right x 10 Baps board fwd / bwd taps, side to side and  Clock wide and counter clock wise x 20  TM side stepping with elevation 3 and speed . 6 miles / hour x 30 minutes per side  Leg press 120 lbs x 20 x 2, heel raises 100 lbs 20 x 2  Cues to go slow and have good form and technique and CGA                         PT Education - 07/02/17 1306    Education provided  Yes    Education Details  HEP     Person(s) Educated  Patient    Methods  Explanation;Demonstration;Tactile cues    Comprehension  Verbalized understanding;Returned demonstration;Verbal cues required;Tactile cues required       PT Short  Term Goals - 04/23/17 1442      PT SHORT TERM GOAL #1   Title  Pt will improve 5x sit to stand to 18 seconds in order to demonstrate improved LE strength and improved dynamic balance.    Baseline  21 sec; 02/26/17: 16.1s    Time  6    Period  Weeks    Status  Achieved      PT SHORT TERM GOAL #2   Title  Pt will improve gait speed to at least 1.0 m/s with proper heel strike and decreased hip hike in order to demonstrate imrpoved LE strength and balance.    Baseline  0.83 m/s; 02/26/17: 1.11 m/s    Time  6    Period  Weeks    Status  Achieved      PT SHORT TERM GOAL #3   Title  Pt will improve Berg Balance score to 51/56 in order to demonstrate improved dynamic and static balance, decreasing overall falls risk.    Baseline  48/56; 02/26/17: 53/56    Time  6    Period  Weeks    Status  Achieved        PT Long Term Goals - 06/16/17 1308      PT LONG TERM GOAL #1   Title  Pt will demonstrate independence with HEP in order to continue LE strengthening and manage symptoms.    Time  12    Period  Weeks    Status  On-going    Target Date  08/11/17      PT LONG TERM GOAL #2   Title  Pt will improve 5x sit to stand to <15 seconds in order to demonstrate improved LE strength and dynamic balance.    Baseline  21 sec; 02/26/17: 16.1s 16.50 sec 04/23/17; 16 sec 05/26/17, 06/16/17 16 sec    Time  12    Period  Weeks    Status  On-going    Target Date  08/11/17      PT LONG TERM GOAL #3   Title  Pt will demonstrate gait speed of at least 1.2 m/s in order to demonstrate improved LE strength and improved balance in order to return to prior level of funciton.    Baseline  0.833 m/s 02/26/17: 1.11 m/s, 12/189/18 1.16 m/sec; 1.23 m/sec    Time  12    Period  Weeks    Status  Achieved    Target Date  06/16/17      PT LONG TERM GOAL #4  Title  Pt will improve Berg Balance score to at least 54/56 in order demonstrate improved dynamic and static balance, decreasing overall falls risk.     Baseline  48/56; 02/26/17: 53/56; 04/23/17  53/56    Time  12    Period  Weeks    Status  Achieved    Target Date  06/16/17      PT LONG TERM GOAL #5   Title  Pt will demonstrate improved LE strength to 4+/5 in all limited planes in order to return to prior level of function.    Baseline  LLE hip flex 4/5, abd 4+/5, ext 4/5, LLE knee 5/5, ankle 5/5    Time  12    Period  Weeks    Status  On-going    Target Date  08/11/17      Additional Long Term Goals   Additional Long Term Goals  Yes      PT LONG TERM GOAL #6   Title  Pt will improve 6MWT to >1500 feet without using AD in order to return to prior level of function, demonstrating improvements in LE strength, balance and endurance.    Baseline  1205 ft without AD; 02/26/17: deferred to next session 04/23/17 1420 ft, 1525 feet    Time  12    Period  Weeks    Status  On-going    Target Date  08/11/17      PT LONG TERM GOAL #7   Title  Patient will perform TM walking elevated . 3 with increased speed and no loss of balance for 5 mins to be able to walk a golf  course.    Time  12    Period  Weeks    Status  New    Target Date  08/11/17            Plan - 07/02/17 1307    Clinical Impression Statement  Patient performs LE strengthening exercises and core strengthening exericses with dynamic standing challenges with BOSU ball, 1/2 foam and uneven surfaces. Patient faitugues with repetitions. He has no pain behaviots Patient will continue to benefit from skilled PT to improve core and LE strength and improve funmtional mobility.    Rehab Potential  Good    Clinical Impairments Affecting Rehab Potential  high prior level of function    PT Frequency  2x / week    PT Duration  12 weeks    PT Treatment/Interventions  ADLs/Self Care Home Management;Cryotherapy;Moist Heat;Gait training;Stair training;Functional mobility training;Therapeutic activities;Therapeutic exercise;Balance training;Neuromuscular re-education;Patient/family  education;Manual techniques    PT Next Visit Plan  6MWT, MMT, update those specific goals, continue strength/balance;    PT Home Exercise Plan  Add green theraband to HEP exercises    Consulted and Agree with Plan of Care  Patient       Patient will benefit from skilled therapeutic intervention in order to improve the following deficits and impairments:  Abnormal gait, Decreased balance, Decreased strength, Difficulty walking, Increased edema  Visit Diagnosis: Other lack of coordination  Muscle weakness (generalized)  Unsteadiness on feet  Acute ischemic stroke (HCC) - 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 Low vitamin B12 level 06/27/2017  . 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 Pure hypercholesterolemia 05/28/2017  . Erectile dysfunction 05/28/2017  . Left hemiparesis (HCC)   .  Essential hypertension 11/29/2016  . 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 Leukopenia 03/31/2016  . Hepatitis C 07/22/2013    Ezekiel Ina , [place] DPT 07/02/2017, 1:23 PM  [place] [facility] MAIN [facility] SERVICES [address] [place], [place], [address] Phone: [phone]   Fax:  [phone]  Name: Jason Nolan MRN: [id number] Date of Birth: 01-Jan-1946

## 2017-07-02 NOTE — Therapy (Signed)
[place] [facility] MAIN [facility] SERVICES [address] [place], [place], [address] Phone: [phone]   Fax:  [phone]  Occupational Therapy Treatment  Patient Details  Name: Jason Nolan MRN: [id number] Date of Birth: 1945/12/13 Referring Provider: [name]   Encounter Date: 07/02/2017  OT End of Session - 07/02/17 1405    Visit Number  29    Number of Visits  48    Date for OT Re-Evaluation  07/09/17    OT Start Time  1348    OT Stop Time  1430    OT Time Calculation (min)  42 min    Activity Tolerance  Patient tolerated treatment well    Behavior During Therapy  [facility] for tasks assessed/performed       Past Medical History:  Diagnosis Date  . Glaucoma   . Hepatitis C   . Stroke [facility])     Past Surgical History:  Procedure Laterality Date  . IR ANGIO INTRA EXTRACRAN SEL COM CAROTID INNOMINATE UNI L MOD SED  11/25/2016  . IR ANGIO VERTEBRAL SEL SUBCLAVIAN INNOMINATE UNI R MOD SED  11/25/2016  . IR ANGIO VERTEBRAL SEL VERTEBRAL UNI L MOD SED  11/25/2016  . IR INTRAVSC STENT CERV CAROTID W/O EMB-PROT MOD SED INC ANGIO  11/25/2016  . IR PERCUTANEOUS ART THROMBECTOMY/INFUSION INTRACRANIAL INC DIAG ANGIO  11/25/2016  . IR RADIOLOGIST EVAL & MGMT  02/13/2017  . RADIOLOGY WITH ANESTHESIA N/A 11/25/2016   Procedure: RADIOLOGY WITH ANESTHESIA;  Surgeon: Radiologist, Medication, MD;  Location: [facility] OR;  Service: Radiology;  Laterality: N/A;    OT TREATMENT    Neuro muscular re-education:  Pt. worked on grasping 2" large pegs while using thumb opposition alternating to the 2nd through 5th digits on the [employer] board placed at a tabletop surface.  Therapeutic Exercise:  Pt. performed gross gripping with grip strengthener. Pt. worked on sustaining grip while grasping pegs and reaching at various heights. The gripper was placed in the 4th resistive slot with the white resistive spring.                              OT  Education - 07/02/17 1404    Education provided  Yes    Education Details  HEP    Person(s) Educated  Patient    Methods  Demonstration;Tactile cues;Explanation    Comprehension  Verbalized understanding;Returned demonstration;Verbal cues required;Tactile cues required          OT Long Term Goals - 04/16/17 1633      OT LONG TERM GOAL #1   Title  Pt. will improve edema by 2 cm through MPs in preparation for ROM.    Baseline  improving    Time  12    Period  Weeks    Status  On-going    Target Date  07/09/17      OT LONG TERM GOAL #2   Title  Pt. will improve Left hand digit extension in preparation for weightbearing, and releasing objects during ADLs/IADLs.    Baseline  Pt. is unable to achieve full digit extension on the left.    Time  12    Period  Weeks    Status  On-going    Target Date  07/09/17      OT LONG TERM GOAL #3   Title  Pt. left hand grip with  increase by 5# in preparation for holding a cup.  Baseline  Left 2#, RIght: 60    Time  12    Period  Weeks    Status  On-going    Target Date  07/09/17      OT LONG TERM GOAL #4   Title  Pt. will increased right shoulder strength by 1 mm grade to assist with ADLs.    Baseline  Impaired LUE strength    Time  12    Period  Weeks    Target Date  07/09/17      OT LONG TERM GOAL #5   Title  Pt. will increase left wrist extension in preparation for reaching ADL items.    Baseline  wrist extension 28 degrees    Time  12    Period  Weeks    Status  On-going    Target Date  07/09/17      OT LONG TERM GOAL #6   Title  Pt. will improve left hand coordination skills to be able to button clothing.    Baseline  Pt. conitnues to have difficulty    Time  12    Period  Weeks    Status  On-going    Target Date  07/09/17      OT LONG TERM GOAL #7   Title  Pt. will independently demonstrate visual compensatory strategies duirng ADLs, and IADLs.    Baseline  Glaucoma    Time  12    Period  Weeks    Status   On-going    Target Date  07/09/17            Plan - 07/02/17 1406    Clinical Impression Statement Pt. Reports that he worked in his yard to prune a [employer] tree. Pt. is making excellent pregress. Pt. continues to work on increasing LUE strength, and coordination skills for improved engagement in ADL, and IADL tasks. Plan to recert pt. in the next treatment week.    Occupational performance deficits (Please refer to evaluation for details):  IADL's;ADL's    Rehab Potential  Excellent    OT Frequency  2x / week    OT Duration  12 weeks    OT Treatment/Interventions  Self-care/ADL training;Therapeutic exercise;Neuromuscular education;Patient/family education;Energy conservation;Therapeutic activities;DME and/or AE instruction    Clinical Decision Making  Multiple treatment options, significant modification of task necessary    Consulted and Agree with Plan of Care  Patient       Patient will benefit from skilled therapeutic intervention in order to improve the following deficits and impairments:  Abnormal gait, Pain, Decreased strength, Decreased range of motion, Impaired UE functional use, Decreased coordination, Decreased knowledge of use of DME, Decreased activity tolerance, Difficulty walking, Decreased balance, Other (comment)  Visit Diagnosis: Muscle weakness (generalized)  Other lack of coordination    Problem List Patient Active Problem List   Diagnosis Date Noted  . Low vitamin B12 level 06/27/2017  . 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 Pure hypercholesterolemia 05/28/2017  . Erectile dysfunction 05/28/2017  . Left hemiparesis (HCC)   . Essential hypertension 11/29/2016  . 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 Leukopenia 03/31/2016  . Hepatitis C 07/22/2013    Olegario Messier, MS, OTR/L 07/02/2017, 2:25 PM  [place] [facility] MAIN [facility] SERVICES [address] [place], [place], [address] Phone: [phone]   Fax:  [phone]  Name: Terrilee CroakJames Willingham MRN: [id number] Date of Birth: 12-17-1945

## 2017-07-04 ENCOUNTER — Encounter: Payer: Self-pay | Admitting: Hematology and Oncology

## 2017-07-04 ENCOUNTER — Inpatient Hospital Stay: Payer: Medicare Other | Attending: Hematology and Oncology | Admitting: Hematology and Oncology

## 2017-07-04 VITALS — BP 136/76 | HR 68 | Temp 96.5°F | Resp 18 | Wt 201.1 lb

## 2017-07-04 DIAGNOSIS — D72818 Other decreased white blood cell count: Secondary | ICD-10-CM | POA: Diagnosis not present

## 2017-07-04 DIAGNOSIS — D708 Other neutropenia: Secondary | ICD-10-CM

## 2017-07-04 NOTE — Progress Notes (Signed)
Patient offers no complaints. 

## 2017-07-04 NOTE — Progress Notes (Signed)
[facility]  Clinic day:  07/04/2017  Chief Complaint: Jason Nolan is a 72 y.o. male with leukopenia who is seen for review of interval ultrasound and additional work-up.  HPI:  The patient was last seen in the hematology clinic on 06/27/2017.  At that time, he denied any complaints.  He denied any issues with infections.  Exam revealed no adenopathy or hepatosplenomegaly.  Work-up revealed + hepatitis B core antibody (prior infection) and a low normal B12 (271).    Methylmalonic acid was 89 and not c/w B12 deficiency.  AFP was 2.2 (normal).  Abdominal ultrasound on 07/01/2017 revealed the liver was upper normal in size with normal attenuation. There were no focal liver lesions. Spleen was normal in size and contour.  There were no splenic lesions evident.  The pancreas was completely obscured by gas.  There was a simple 1.7 x 1.8 cm cyst in mid left kidney.  During the interim, patient doing well. He has no complaints today. Patient has had no recent infections. He denies pain in the clinic today.    Past Medical History:  Diagnosis Date  . Glaucoma   . Hepatitis C   . Stroke [facility])     Past Surgical History:  Procedure Laterality Date  . IR ANGIO INTRA EXTRACRAN SEL COM CAROTID INNOMINATE UNI L MOD SED  11/25/2016  . IR ANGIO VERTEBRAL SEL SUBCLAVIAN INNOMINATE UNI R MOD SED  11/25/2016  . IR ANGIO VERTEBRAL SEL VERTEBRAL UNI L MOD SED  11/25/2016  . IR INTRAVSC STENT CERV CAROTID W/O EMB-PROT MOD SED INC ANGIO  11/25/2016  . IR PERCUTANEOUS ART THROMBECTOMY/INFUSION INTRACRANIAL INC DIAG ANGIO  11/25/2016  . IR RADIOLOGIST EVAL & MGMT  02/13/2017  . RADIOLOGY WITH ANESTHESIA N/A 11/25/2016   Procedure: RADIOLOGY WITH ANESTHESIA;  Surgeon: Radiologist, Medication, MD;  Location: [facility] OR;  Service: Radiology;  Laterality: N/A;    Family History  Problem Relation Age of Onset  . Hypertension Mother   . Hypertension Father     Social History:   reports that he quit smoking about 29 years ago. He has a 7.50 pack-year smoking history. he has never used smokeless tobacco. He reports that he does not drink alcohol or use drugs.  Patient does not smoke or drink alcohol. Patient originally from [place]. He moved to [place] in 2010. 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 The patient is alone today.   Allergies: No Known Allergies  Current Medications: Current Outpatient Medications  Medication Sig Dispense Refill  . amLODipine (NORVASC) 10 MG tablet TAKE 1 TABLET DAILY 90 tablet 0  . aspirin 81 MG chewable tablet Chew 1 tablet (81 mg total) by mouth daily.    Marland Kitchen atorvastatin (LIPITOR) 40 MG tablet TAKE 1 TABLET DAILY AT 6 P.M. 90 tablet 0  . BRILINTA 90 MG TABS tablet TAKE 1 TABLET TWICE A DAY 180 tablet 0  . dorzolamide-timolol (COSOPT) 22.3-6.8 MG/ML ophthalmic solution     . PAZEO 0.7 % SOLN Place 1 drop into both eyes daily.     . timolol (TIMOPTIC) 0.5 % ophthalmic solution Place 1 drop into the right eye daily.     Marland Kitchen VYZULTA 0.024 % SOLN Apply 1 drop to eye at bedtime.      No current facility-administered medications for this visit.     Review of Systems:  GENERAL:  Feels "fine".  No fevers or sweats. Weight down 1 pound. PERFORMANCE STATUS (ECOG):  0 HEENT:  No visual changes, runny nose, sore throat, mouth sores or tenderness.  Needs wisdom tooth removed. Lungs: No shortness of breath or cough.  No hemoptysis. Cardiac:  No chest pain, palpitations, orthopnea, or PND. GI:  Constipation at times.  No nausea, vomiting, diarrhea, melena or hematochezia. GU:  No urgency, frequency, dysuria, or hematuria. Musculoskeletal:  No back pain.  No joint pain.  No muscle tenderness. Extremities:  No pain or swelling. Skin:  No rashes or skin changes. Neuro:  No headache, numbness or weakness, balance or coordination issues. Endocrine:  No diabetes, thyroid issues, hot flashes or night sweats. Psych:  No mood changes, depression or  anxiety. Pain:  No focal pain. Review of systems:  All other systems reviewed and found to be negative.  Physical Exam: Blood pressure 136/76, pulse 68, temperature (!) 96.5 F (35.8 C), temperature source Tympanic, resp. rate 18, weight 201 lb 2 oz (91.2 kg), SpO2 97 %. GENERAL:  Well developed, well nourished, gentleman sitting comfortably in the exam room in no acute distress. MENTAL STATUS:  Alert and oriented to person, place and time. HEAD:  Wearing a cap.  Goatee.  Scruffy gray beard.  Normocephalic, atraumatic, face symmetric, no Cushingoid features. EYES:  Glasses. Brown eyes. No conjunctivitis or scleral icterus. NEUROLOGICAL: Unremarkable. PSYCH:  Appropriate.   No visits with results within 3 Day(s) from this visit.  Latest known visit with results is:  Appointment on 06/27/2017  Component Date Value Ref Range Status  . AFP, Serum, Tumor Marker 06/27/2017 2.2  0.0 - 8.3 ng/mL Final   Comment: (NOTE) Roche Diagnostics Electrochemiluminescence Immunoassay (ECLIA) Values obtained with different assay methods or kits cannot be used interchangeably.  Results cannot be interpreted as absolute evidence of the presence or absence of malignant disease. This test is not interpretable in pregnant females. Performed At: [facility] [place] [address] [place], [place] [id number] Jolene SchimkeNagendra Sanjai MD WG:[id number]:[phone] Performed at [facility], [address] [place], [place] [address]   . Methylmalonic Acid, Quantitative 06/27/2017 89  0 - 378 nmol/L Final  . Disclaimer: 06/27/2017 Comment   Final   Comment: (NOTE) This test was developed and its performance characteristics determined by [facility]. It has not been cleared or approved by the Food and Drug Administration. Performed At: [facility]  [address] [place], [place] [id number] Jolene SchimkeNagendra Sanjai MD KG:[id number]:[phone] Performed at [facility], [address] [place], [place] [address]      Assessment:  Jason CroakJames Windle is a 72 y.o. 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  WBC has ranged between 2400 - 4900.  ANC was 2900 on 11/26/2016.  Diet is good.  He is on no new medications or herbal products.  CBC on 05/28/2017 included a hematocrit of 44.6, hemoglobin 15, MCV 92, platelets 201,000 and white count 2900.  Creatinine was 0.91 and LFTs were normal.  Work-up on 06/20/2017 revealed a hematocrit of 44.6, hemoglobin 14.8, MCV 92.1, platelets 202,000, WBC 3000 with an ANC of 1500.  Differential was unremarkable.  Normal studies included:  copper level, folate, ANA, hepatitis C virus RNA by PCR, HIV testing.  Hepatitis B core antibody was positive.  B12 was low normal (271).  MMA was normal on 06/27/2017.  He has a history of hepatitis C, genotype 1b.  He was previously treated with PEG-Interferon/RBV in 2009-2010.  His viral load never became undetectable and treatment was discontinued.  He was treated with Harvoni.  Hepatitis C RNA by PCR was undetectable  in 12/2015.  AFP was normal on 06/27/2017.  Abdominal ultrasound on 07/01/2017 revealed the liver was upper normal in size with normal attenuation. There were no focal liver lesions. Spleen was normal in size and contour.    Symptomatically, he denies any complaints.  He denies any issues with infections.  Exam reveals no adenopathy or hepatosplenomegaly.  Plan: 1.  Review interval labs.  No current evidence of B12 deficiency. 2.  Discuss abdominal ultrasound- upper normal size liver without mass lesions.  Spleen is normal. 3.  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  Hepatitis B core + antibody indicates prior infection.  As long as counts stable without issues with infection, no further evaluation needed. 4.  Review neutropenia precautions. Patient to call clinic  if he develops any febrile illness symptoms.  5.  RTC prn.   Quentin Mulling, NP  07/04/2017, 10:18 AM   I saw and evaluated the patient, participating in the key portions of the service and reviewing pertinent diagnostic studies and records.  I reviewed the nurse practitioner's note and agree with the findings and the plan.  The assessment and plan were discussed with the patient.  Several questions were asked by the patient and answered.   Nelva Nay, MD 07/04/2017, 10:18 AM

## 2017-07-07 ENCOUNTER — Encounter: Payer: Self-pay | Admitting: Physical Therapy

## 2017-07-07 ENCOUNTER — Encounter: Payer: Self-pay | Admitting: Occupational Therapy

## 2017-07-07 ENCOUNTER — Ambulatory Visit: Payer: Medicare Other | Admitting: Occupational Therapy

## 2017-07-07 ENCOUNTER — Encounter: Payer: Self-pay | Admitting: Internal Medicine

## 2017-07-07 ENCOUNTER — Ambulatory Visit: Payer: Medicare Other | Attending: Internal Medicine | Admitting: Physical Therapy

## 2017-07-07 DIAGNOSIS — R2681 Unsteadiness on feet: Secondary | ICD-10-CM | POA: Insufficient documentation

## 2017-07-07 DIAGNOSIS — M6281 Muscle weakness (generalized): Secondary | ICD-10-CM | POA: Insufficient documentation

## 2017-07-07 DIAGNOSIS — R471 Dysarthria and anarthria: Secondary | ICD-10-CM | POA: Diagnosis not present

## 2017-07-07 DIAGNOSIS — I639 Cerebral infarction, unspecified: Secondary | ICD-10-CM | POA: Diagnosis not present

## 2017-07-07 DIAGNOSIS — R278 Other lack of coordination: Secondary | ICD-10-CM

## 2017-07-07 NOTE — Therapy (Signed)
[facility] MAIN [facility] SERVICES [address] [place], [place], [address] Phone: [phone]   Fax:  [phone]  Physical Therapy Treatment  Patient Details  Name: Jason Nolan MRN: [id number] Date of Birth: 07/13/1945 Referring Provider: [name]   Encounter Date: 07/07/2017  PT End of Session - 07/07/17 1314    Visit Number  34    Number of Visits  71    Date for PT Re-Evaluation  09/10/17    PT Start Time  0100    PT Stop Time  0145    PT Time Calculation (min)  45 min    Equipment Utilized During Treatment  Gait belt    Activity Tolerance  Patient tolerated treatment well    Behavior During Therapy  [facility] for tasks assessed/performed       Past Medical History:  Diagnosis Date  . Glaucoma   . Hepatitis C   . Stroke [facility])     Past Surgical History:  Procedure Laterality Date  . IR ANGIO INTRA EXTRACRAN SEL COM CAROTID INNOMINATE UNI L MOD SED  11/25/2016  . IR ANGIO VERTEBRAL SEL SUBCLAVIAN INNOMINATE UNI R MOD SED  11/25/2016  . IR ANGIO VERTEBRAL SEL VERTEBRAL UNI L MOD SED  11/25/2016  . IR INTRAVSC STENT CERV CAROTID W/O EMB-PROT MOD SED INC ANGIO  11/25/2016  . IR PERCUTANEOUS ART THROMBECTOMY/INFUSION INTRACRANIAL INC DIAG ANGIO  11/25/2016  . IR RADIOLOGIST EVAL & MGMT  02/13/2017  . RADIOLOGY WITH ANESTHESIA N/A 11/25/2016   Procedure: RADIOLOGY WITH ANESTHESIA;  Surgeon: Radiologist, Medication, MD;  Location: [facility] OR;  Service: Radiology;  Laterality: N/A;    There were no vitals filed for this visit.  Subjective Assessment - 07/07/17 1313    Subjective  Patinet is doing fine today.No new concerns. Patient is doing his HEP; walking and doing his theraband exercises.    Pertinent History  Pt. is a 72 y.o. 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 Pt. was transferred to [facility] He underwent CTA brain showed short segment near occlusion of proximal R-ICA with associated intraluminal  thrombus --likely due to dissection and emergent large vessel occlusion with right M2 occlusion and evolving right frontal lobe infarct.  He underwent cerebral angio with  complete revascularization of occluded superior division of R-MCA and near complete revascularization of proximal R-ICA with stent assisted angioplasty.  Stroke felt to be embolic in setting of ruptured plaque R-ICA and he was placed on ASA and brillinta due to stent. Pt. was transferred to inpatient rehab for several weeks, had home health therapy upon discharge.    Limitations  Walking;Lifting;House hold activities    How long can you sit comfortably?  unlimited     How long can you stand comfortably?  unlimited     How long can you walk comfortably?  unsure    Patient Stated Goals  "strengthen my whole L side"    Currently in Pain?  No/denies    Pain Score  0-No pain        Treatment: 1/2 foam tandem and ball toss with min assist and some loss of balance to the left side x 20 , to challenge dynamic balance and ankle stability  Matrix 22.5 lbs fwd / bwd x 5  And side to side with 17. 5 lbs x 5 over mat and 2  4 inch steps x 5 repetitions to challenge dynamic balance and weight shifting  Leg press with 120  lbs single leg press x 20 x 2 and double leg press x 20 x 2, to progress strength  TM walking side stepping . 5 m/ hour 3 mins per side with cues for posture, to challenge dynamic standing balance  Patient needs CGA and min asssit and vc for correct technique and posture.                        PT Education - 07/07/17 1314    Education provided  Yes    Education Details  balance strageties    Person(s) Educated  Patient    Methods  Explanation;Demonstration    Comprehension  Verbalized understanding;Returned demonstration;Verbal cues required       PT Short Term Goals - 04/23/17 1442      PT SHORT TERM GOAL #1   Title  Pt will improve 5x sit to stand to 18 seconds in order to demonstrate  improved LE strength and improved dynamic balance.    Baseline  21 sec; 02/26/17: 16.1s    Time  6    Period  Weeks    Status  Achieved      PT SHORT TERM GOAL #2   Title  Pt will improve gait speed to at least 1.0 m/s with proper heel strike and decreased hip hike in order to demonstrate imrpoved LE strength and balance.    Baseline  0.83 m/s; 02/26/17: 1.11 m/s    Time  6    Period  Weeks    Status  Achieved      PT SHORT TERM GOAL #3   Title  Pt will improve Berg Balance score to 51/56 in order to demonstrate improved dynamic and static balance, decreasing overall falls risk.    Baseline  48/56; 02/26/17: 53/56    Time  6    Period  Weeks    Status  Achieved        PT Long Term Goals - 06/16/17 1308      PT LONG TERM GOAL #1   Title  Pt will demonstrate independence with HEP in order to continue LE strengthening and manage symptoms.    Time  12    Period  Weeks    Status  On-going    Target Date  08/11/17      PT LONG TERM GOAL #2   Title  Pt will improve 5x sit to stand to <15 seconds in order to demonstrate improved LE strength and dynamic balance.    Baseline  21 sec; 02/26/17: 16.1s 16.50 sec 04/23/17; 16 sec 05/26/17, 06/16/17 16 sec    Time  12    Period  Weeks    Status  On-going    Target Date  08/11/17      PT LONG TERM GOAL #3   Title  Pt will demonstrate gait speed of at least 1.2 m/s in order to demonstrate improved LE strength and improved balance in order to return to prior level of funciton.    Baseline  0.833 m/s 02/26/17: 1.11 m/s, 12/189/18 1.16 m/sec; 1.23 m/sec    Time  12    Period  Weeks    Status  Achieved    Target Date  06/16/17      PT LONG TERM GOAL #4   Title  Pt will improve Berg Balance score to at least 54/56 in order demonstrate improved dynamic and static balance, decreasing overall falls risk.    Baseline  48/56; 02/26/17: 53/56; 04/23/17  53/56    Time  12    Period  Weeks    Status  Achieved    Target Date  06/16/17      PT  LONG TERM GOAL #5   Title  Pt will demonstrate improved LE strength to 4+/5 in all limited planes in order to return to prior level of function.    Baseline  LLE hip flex 4/5, abd 4+/5, ext 4/5, LLE knee 5/5, ankle 5/5    Time  12    Period  Weeks    Status  On-going    Target Date  08/11/17      Additional Long Term Goals   Additional Long Term Goals  Yes      PT LONG TERM GOAL #6   Title  Pt will improve to >1500 feet without using AD in order to return to prior level of function, demonstrating improvements in LE strength, balance and endurance.    Baseline  1205 ft without AD; 02/26/17: deferred to next session 04/23/17 1420 ft, 1525 feet    Time  12    Period  Weeks    Status  On-going    Target Date  08/11/17      PT LONG TERM GOAL #7   Title  Patient will perform TM walking elevated . 3 with increased speed and no loss of balance for 5 mins to be able to walk a golf  course.    Time  12    Period  Weeks    Status  New    Target Date  08/11/17            Plan - 07/07/17 1315    Clinical Impression Statement  Pt was able to progress dynamic balance exercises today, noting improved postural reactions with LOB and moving outside normal BOS.  Pt was able to perform all exercises on uneven surfaces with minimal LOB noted.  Dynamic balance stability activities were progressed  today, with decrease control noted when attempting to perform tasks with the R > L.  Pt would continue to benefit from skilled therapy services to address further balance impairments and decrease falls risk.    Rehab Potential  Good    Clinical Impairments Affecting Rehab Potential  high prior level of function    PT Frequency  2x / week    PT Duration  12 weeks    PT Treatment/Interventions  ADLs/Self Care Home Management;Cryotherapy;Moist Heat;Gait training;Stair training;Functional mobility training;Therapeutic activities;Therapeutic exercise;Balance training;Neuromuscular  re-education;Patient/family education;Manual techniques    PT Next Visit Plan  , MMT, update those specific goals, continue strength/balance;    PT Home Exercise Plan  Add green theraband to HEP exercises    Consulted and Agree with Plan of Care  Patient       Patient will benefit from skilled therapeutic intervention in order to improve the following deficits and impairments:  Abnormal gait, Decreased balance, Decreased strength, Difficulty walking, Increased edema  Visit Diagnosis: Muscle weakness (generalized)  Other lack of coordination  Unsteadiness on feet  Acute ischemic stroke (HCC) - 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 Low vitamin B12 level 06/27/2017  . 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 Pure hypercholesterolemia 05/28/2017  . Erectile dysfunction 05/28/2017  . Left hemiparesis (HCC)   . Essential hypertension 11/29/2016  .  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 Leukopenia 03/31/2016  . Hepatitis C 07/22/2013    Ezekiel Ina, [place] DPT 07/07/2017, 1:49 PM  [facility] MAIN [facility] SERVICES [address] [place], [place], [address] Phone: [phone]   Fax:  [phone]  Name: Leanord Thibeau MRN: [id number] Date of Birth: [birth year]

## 2017-07-07 NOTE — Therapy (Signed)
[place] [facility] MAIN [facility] SERVICES [address] [place], [place], [address] Phone: [phone]   Fax:  [phone]  Occupational Therapy Treatment/Recertification Note  Patient Details  Name: Jason Nolan MRN: [id number] Date of Birth: 03-06-1946 Referring Provider: [name]   Encounter Date: 07/07/2017  OT End of Session - 07/07/17 1354    Visit Number  30    Number of Visits  48    Date for OT Re-Evaluation  09/24/17    OT Start Time  1345    OT Stop Time  1430    OT Time Calculation (min)  45 min    Activity Tolerance  Patient tolerated treatment well    Behavior During Therapy  [facility] for tasks assessed/performed       Past Medical History:  Diagnosis Date  . Glaucoma   . Hepatitis C   . Stroke [facility])     Past Surgical History:  Procedure Laterality Date  . IR ANGIO INTRA EXTRACRAN SEL COM CAROTID INNOMINATE UNI L MOD SED  11/25/2016  . IR ANGIO VERTEBRAL SEL SUBCLAVIAN INNOMINATE UNI R MOD SED  11/25/2016  . IR ANGIO VERTEBRAL SEL VERTEBRAL UNI L MOD SED  11/25/2016  . IR INTRAVSC STENT CERV CAROTID W/O EMB-PROT MOD SED INC ANGIO  11/25/2016  . IR PERCUTANEOUS ART THROMBECTOMY/INFUSION INTRACRANIAL INC DIAG ANGIO  11/25/2016  . IR RADIOLOGIST EVAL & MGMT  02/13/2017  . RADIOLOGY WITH ANESTHESIA N/A 11/25/2016   Procedure: RADIOLOGY WITH ANESTHESIA;  Surgeon: Radiologist, Medication, MD;  Location: [facility] OR;  Service: Radiology;  Laterality: N/A;    There were no vitals filed for this visit.  Subjective Assessment - 07/07/17 1353    Subjective   PPt. reports right thumb IP soreness comes, and goes.    Patient is accompained by:  Family member    Pertinent History  Pt. is a 72 y.o. 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 Pt. was transferred to [facility]. He underwent CTA brain showed short segment near occlusion of proximal R-ICA with associated intraluminal thrombus --likely due to dissection and emergent large vessel  occlusion with right M2 occlusion and evolving right frontal lobe infarct.  He underwent cerebral angio with  complete revascularization of occluded superior division of R-MCA and near complete revascularization of proximal R-ICA with stent assisted angioplasty.  Stroke felt to be embolic in setting of ruptured plaque R-ICA and he was placed on ASA and brillinta due to stent. Pt. was transferred to inpatient rehab for several weeks, had home health therapy upon discharge, and now is ready for outpatient therapy services.    Currently in Pain?  No/denies    Pain Score  0-No pain         [facility] OT Assessment - 07/07/17 1405      Coordination   Right 9 Hole Peg Test  34    Left 9 Hole Peg Test  4 min.      AROM   Overall AROM Comments  Left shoulder flexion: 148, wrist extension: 65      Strength   Overall Strength Comments  LUE shoulder flexion, 4+/5, abduction 4+/5, elbow flexion, extension 5/5, wrist extension, 4/5      Hand Function   Right Hand Grip (lbs)  63    Right Hand Lateral Pinch  20 lbs    Right Hand 3 Point Pinch  20 lbs    Left Hand Grip (lbs)  13    Left Hand Lateral  Pinch  8 lbs    Left 3 point pinch  6 lbs       OT TREATMENT    Measurements were obtained for LUE ROM, grip strength, pinch strength, and coordination skills.   Self-care:  Pt. worked on grasping a built-up handle pen, and holding the pen grasp while achieving distal movement. Pt. Attempts to formulate letters while moving the LUE as one unit.                      OT Education - 07/07/17 1354    Education Details  UE strength, and coordination.    Person(s) Educated  Patient    Methods  Explanation;Demonstration    Comprehension  Verbalized understanding;Returned demonstration;Verbal cues required          OT Long Term Goals - 07/07/17 1424      OT LONG TERM GOAL #1   Title  Pt. will improve edema by 2 cm through MPs in preparation for ROM.    Baseline  improving 07/07/2017:  wrist: 19cm., MPs: 24 cm.    Time  12    Period  Weeks    Status  On-going    Target Date  09/29/17      OT LONG TERM GOAL #2   Title  Pt. will improve Left hand digit extension in preparation for weightbearing, and releasing objects during ADLs/IADLs.    Baseline  Pt. is unable to achieve full digit extension on the left. 07/07/2017: Pt. is able to achieve full digit extension.    Time  12    Period  Weeks    Status  Achieved      OT LONG TERM GOAL #3   Title  Pt. left hand grip with  increase by 5# in preparation for holding a cup.    Baseline  Left 2#, RIght: 60 07/07/2017: Left: 13    Time  12    Period  Weeks    Status  On-going    Target Date  09/29/17      OT LONG TERM GOAL #4   Title  Pt. will increased right shoulder strength by 1 mm grade to assist with ADLs.    Baseline  07/07/2017: progressing.    Time  12    Period  Weeks      OT LONG TERM GOAL #5   Title  Pt. will increase left wrist extension in preparation for reaching ADL items.    Baseline  wrist extension 28 degrees, 07/07/2017: wrist extension: 65    Time  12    Period  Weeks    Status  Achieved      Long Term Additional Goals   Additional Long Term Goals  Yes      OT LONG TERM GOAL #6   Title  Pt. will improve left hand coordination skills to be able to button clothing.    Baseline  Pt. continues to have difficulty 07/07/2017: 4 min. via 9 hole peg test    Time  12    Period  Weeks    Status  On-going    Target Date  09/29/17      OT LONG TERM GOAL #7   Title  Pt. will independently demonstrate visual compensatory strategies duirng ADLs, and IADLs.    Baseline  Glaucoma    Time  12    Period  Weeks    Status  On-going      OT LONG TERM GOAL #8  Title  Pt. will increase Left pinch strength to be able to grasp and hold items during ADL tasks.    Baseline  07/07/04: lateral pinch: 8#, 3pt. pinch 6#    Time  12    Period  Weeks    Status  New    Target Date  09/29/17      OT LONG TERM GOAL  #9    Baseline  Pt. will write one sentence with 75% legibility and efficiently while maintaing grasp on pen 100% of the time.    Time  12    Period  Weeks    Status  New    Target Date  09/29/17      OT LONG TERM GOAL  #10   TITLE  Pt. will independently use his left hand to grasp and efficiently accomodate to the varying weights of ADL objects during self care tasks.     Baseline  07/07/2017: Pt. has difficulty grasping and adjusting hand to the varying weights of objects.    Time  12    Period  Weeks    Status  New    Target Date  09/29/17            Plan - 07/07/17 1356    Clinical Impression Statement Pt. is using his LUE to turn on the night light switch over the stove, Pt. is engaging his Right hand more for yardwork, and picking up cases of water. Pt. continues to work on improving LUE strength, motor control, hand function, and  coordination skills for improved ADL, and IADL functioning.     Occupational performance deficits (Please refer to evaluation for details):  ADL's;IADL's    Rehab Potential  Excellent    OT Frequency  2x / week    OT Duration  12 weeks    OT Treatment/Interventions  Self-care/ADL training;Therapeutic exercise;Neuromuscular education;Patient/family education;Energy conservation;Therapeutic activities;DME and/or AE instruction    Clinical Decision Making  Multiple treatment options, significant modification of task necessary    Consulted and Agree with Plan of Care  Patient       Patient will benefit from skilled therapeutic intervention in order to improve the following deficits and impairments:  Abnormal gait, Pain, Decreased strength, Decreased range of motion, Impaired UE functional use, Decreased coordination, Decreased knowledge of use of DME, Decreased activity tolerance, Difficulty walking, Decreased balance, Other (comment)  Visit Diagnosis: Muscle weakness (generalized)  Other lack of coordination    Problem List Patient Active Problem  List   Diagnosis Date Noted  . Low vitamin B12 level 06/27/2017  . 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 Pure hypercholesterolemia 05/28/2017  . Erectile dysfunction 05/28/2017  . Left hemiparesis (HCC)   . Essential hypertension 11/29/2016  . 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 Leukopenia 03/31/2016  . Hepatitis C 07/22/2013    Olegario Messier, MS, OTR/L 07/07/2017, 2:51 PM  [place] [facility] MAIN [facility] SERVICES [address] [place], [place], [address] Phone: [phone]   Fax:  [phone]  Name: Jason Nolan MRN: [id number] Date of Birth: 1945-09-26

## 2017-07-08 ENCOUNTER — Encounter: Payer: Self-pay | Admitting: Internal Medicine

## 2017-07-09 ENCOUNTER — Ambulatory Visit: Payer: Medicare Other | Admitting: Occupational Therapy

## 2017-07-09 ENCOUNTER — Encounter: Payer: Self-pay | Admitting: Physical Therapy

## 2017-07-09 ENCOUNTER — Ambulatory Visit: Payer: Medicare Other | Admitting: Physical Therapy

## 2017-07-09 ENCOUNTER — Encounter: Payer: Self-pay | Admitting: Occupational Therapy

## 2017-07-09 DIAGNOSIS — I639 Cerebral infarction, unspecified: Secondary | ICD-10-CM

## 2017-07-09 DIAGNOSIS — R471 Dysarthria and anarthria: Secondary | ICD-10-CM | POA: Diagnosis not present

## 2017-07-09 DIAGNOSIS — R278 Other lack of coordination: Secondary | ICD-10-CM | POA: Diagnosis not present

## 2017-07-09 DIAGNOSIS — M6281 Muscle weakness (generalized): Secondary | ICD-10-CM | POA: Diagnosis not present

## 2017-07-09 DIAGNOSIS — R2681 Unsteadiness on feet: Secondary | ICD-10-CM

## 2017-07-09 NOTE — Therapy (Signed)
[facility] MAIN [facility] SERVICES [address] [place], [place], [address] Phone: [phone]   Fax:  [phone]  Occupational Therapy Treatment  Patient Details  Name: Jason Nolan MRN: [id number] Date of Birth: 17-Aug-1945 Referring Provider: [name]   Encounter Date: 07/09/2017  OT End of Session - 07/09/17 1533    Visit Number  31    Number of Visits  48    Date for OT Re-Evaluation  09/24/17    OT Start Time  1515    OT Stop Time  1600    OT Time Calculation (min)  45 min    Activity Tolerance  Patient tolerated treatment well    Behavior During Therapy  [facility] for tasks assessed/performed       Past Medical History:  Diagnosis Date  . Glaucoma   . Hepatitis C   . Stroke [facility])     Past Surgical History:  Procedure Laterality Date  . IR ANGIO INTRA EXTRACRAN SEL COM CAROTID INNOMINATE UNI L MOD SED  11/25/2016  . IR ANGIO VERTEBRAL SEL SUBCLAVIAN INNOMINATE UNI R MOD SED  11/25/2016  . IR ANGIO VERTEBRAL SEL VERTEBRAL UNI L MOD SED  11/25/2016  . IR INTRAVSC STENT CERV CAROTID W/O EMB-PROT MOD SED INC ANGIO  11/25/2016  . IR PERCUTANEOUS ART THROMBECTOMY/INFUSION INTRACRANIAL INC DIAG ANGIO  11/25/2016  . IR RADIOLOGIST EVAL & MGMT  02/13/2017  . RADIOLOGY WITH ANESTHESIA N/A 11/25/2016   Procedure: RADIOLOGY WITH ANESTHESIA;  Surgeon: Radiologist, Medication, MD;  Location: [facility] OR;  Service: Radiology;  Laterality: N/A;    There were no vitals filed for this visit.  Subjective Assessment - 07/09/17 1522    Subjective   PPt. reports right thumb IP soreness comes, and goes.    Patient is accompained by:  Family member    Pertinent History  Pt. is a 72 y.o. 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 Pt. was transferred to [facility]. He underwent CTA brain showed short segment near occlusion of proximal R-ICA with associated intraluminal thrombus --likely due to dissection and emergent large vessel occlusion with right  M2 occlusion and evolving right frontal lobe infarct.  He underwent cerebral angio with  complete revascularization of occluded superior division of R-MCA and near complete revascularization of proximal R-ICA with stent assisted angioplasty.  Stroke felt to be embolic in setting of ruptured plaque R-ICA and he was placed on ASA and brillinta due to stent. Pt. was transferred to inpatient rehab for several weeks, had home health therapy upon discharge, and now is ready for outpatient therapy services.    Limitations  Dominant LUE functioning, left hand edema, distal ROM, coordination, and overal strength.    Currently in Pain?  No/denies       OT TREATMENT    Neuro muscular re-education:  Pt. worked on translatory movements of the left hand. Pt. worked on storing objects in his hand, and moving objects from the palm of his hand to the tip of his 2nd digit, and thumb. Pt. worked on thumb opposition tasks, with taping for thumb opposition with transpore tape.  Therapeutic Exercise:  Pt. performed gross gripping with grip strengthener. Pt. worked on sustaining grip while grasping pegs and reaching at various heights. Gripper was placed in the 4th resistive slot with the white resistive spring. Pt. Worked on pinch strengthening in the left hand for lateral, and 3pt. pinch using red, green, and blue resistive clips. Pt. worked on placing the  clips at various vertical and horizontal angles. Tactile and verbal cues were required for eliciting the desired movement.                       OT Education - 07/09/17 1523    Education provided  Yes    Education Details  extension exercises    Person(s) Educated  Patient    Comprehension  Verbalized understanding          OT Long Term Goals - 07/07/17 1424      OT LONG TERM GOAL #1   Title  Pt. will improve edema by 2 cm through MPs in preparation for ROM.    Baseline  improving 07/07/2017: wrist: 19cm., MPs: 24 cm.    Time  12     Period  Weeks    Status  On-going    Target Date  09/29/17      OT LONG TERM GOAL #2   Title  Pt. will improve Left hand digit extension in preparation for weightbearing, and releasing objects during ADLs/IADLs.    Baseline  Pt. is unable to achieve full digit extension on the left. 07/07/2017: Pt. is able to achieve full digit extension.    Time  12    Period  Weeks    Status  Achieved      OT LONG TERM GOAL #3   Title  Pt. left hand grip with  increase by 5# in preparation for holding a cup.    Baseline  Left 2#, RIght: 60 07/07/2017: Left: 13    Time  12    Period  Weeks    Status  On-going    Target Date  09/29/17      OT LONG TERM GOAL #4   Title  Pt. will increased right shoulder strength by 1 mm grade to assist with ADLs.    Baseline  07/07/2017: progressing.    Time  12    Period  Weeks      OT LONG TERM GOAL #5   Title  Pt. will increase left wrist extension in preparation for reaching ADL items.    Baseline  wrist extension 28 degrees, 07/07/2017: wrist extension: 65    Time  12    Period  Weeks    Status  Achieved      Long Term Additional Goals   Additional Long Term Goals  Yes      OT LONG TERM GOAL #6   Title  Pt. will improve left hand coordination skills to be able to button clothing.    Baseline  Pt. continues to have difficulty 07/07/2017: 4 min. via 9 hole peg test    Time  12    Period  Weeks    Status  On-going    Target Date  09/29/17      OT LONG TERM GOAL #7   Title  Pt. will independently demonstrate visual compensatory strategies duirng ADLs, and IADLs.    Baseline  Glaucoma    Time  12    Period  Weeks    Status  On-going      OT LONG TERM GOAL #8   Title  Pt. will increase Left pinch strength to be able to grasp and hold items during ADL tasks.    Baseline  07/07/04: lateral pinch: 8#, 3pt. pinch 6#    Time  12    Period  Weeks    Status  New    Target Date  09/29/17      OT LONG TERM GOAL  #9   Baseline  Pt. will write one sentence with  75% legibility and efficiently while maintaing grasp on pen 100% of the time.    Time  12    Period  Weeks    Status  New    Target Date  09/29/17      OT LONG TERM GOAL  #10   TITLE  Pt. will independently use his left hand to grasp and efficiently accomodate to the varying weights of ADL objects during self care tasks.     Baseline  07/07/2017: Pt. has difficulty grasping and adjusting hand to the varying weights of objects.    Time  12    Period  Weeks    Status  New    Target Date  09/29/17            Plan - 07/09/17 1535    Clinical Impression Statement Pt. conitnues to work on improving LUE hand straength, hand function, and the development of [facility] skills. Pt. continues to have difficulty with translatory movements of the hand. Pt. is able to store larger objects in the hand, however has difficulty with thumb oppostion, and moving objects from the palm of his hand to the tip of his 2nd digit, and thumb. Pt. continues to work on improving LUE functioning for improved engagemnt during ADLs, and IADLs.    Occupational performance deficits (Please refer to evaluation for details):  ADL's;IADL's    Rehab Potential  Excellent    OT Frequency  2x / week    OT Duration  12 weeks    OT Treatment/Interventions  Self-care/ADL training;Therapeutic exercise;Neuromuscular education;Patient/family education;Energy conservation;Therapeutic activities;DME and/or AE instruction    Clinical Decision Making  Multiple treatment options, significant modification of task necessary    Consulted and Agree with Plan of Care  Patient       Patient will benefit from skilled therapeutic intervention in order to improve the following deficits and impairments:  Abnormal gait, Pain, Decreased strength, Decreased range of motion, Impaired UE functional use, Decreased coordination, Decreased knowledge of use of DME, Decreased activity tolerance, Difficulty walking, Decreased balance, Other (comment)  Visit  Diagnosis: Muscle weakness (generalized)    Problem List Patient Active Problem List   Diagnosis Date Noted  . Low vitamin B12 level 06/27/2017  . 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 Pure hypercholesterolemia 05/28/2017  . Erectile dysfunction 05/28/2017  . Left hemiparesis (HCC)   . Essential hypertension 11/29/2016  . 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 Leukopenia 03/31/2016  . Hepatitis C 07/22/2013    Olegario MessierElaine Aailyah Dunbar, MS, OTR/L 07/09/2017, 5:16 PM  [facility] [facility]IN [facility] [address] [place], [place], [id number] Phone: [phone]   Fax:  [phone]  Name: Terrilee CroakJames Bacallao MRN: [id number] Date of Birth: [birth year]

## 2017-07-09 NOTE — Therapy (Signed)
[place] [facility] MAIN [facility] SERVICES [address] [place], [place], [address] Phone: [phone]   Fax:  [phone]  Physical Therapy Treatment  Patient Details  Name: Delmos Velaquez MRN: [id number] Date of Birth: [birth year] Referring Provider: [name]   Encounter Date: 07/09/2017  PT End of Session - 07/09/17 1455    Visit Number  35    Number of Visits  71    Date for PT Re-Evaluation  09/10/17    PT Start Time  0200    PT Stop Time  0245    PT Time Calculation (min)  45 min    Equipment Utilized During Treatment  Gait belt    Activity Tolerance  Patient tolerated treatment well    Behavior During Therapy  [facility] for tasks assessed/performed       Past Medical History:  Diagnosis Date  . Glaucoma   . Hepatitis C   . Stroke [facility])     Past Surgical History:  Procedure Laterality Date  . IR ANGIO INTRA EXTRACRAN SEL COM CAROTID INNOMINATE UNI L MOD SED  11/25/2016  . IR ANGIO VERTEBRAL SEL SUBCLAVIAN INNOMINATE UNI R MOD SED  11/25/2016  . IR ANGIO VERTEBRAL SEL VERTEBRAL UNI L MOD SED  11/25/2016  . IR INTRAVSC STENT CERV CAROTID W/O EMB-PROT MOD SED INC ANGIO  11/25/2016  . IR PERCUTANEOUS ART THROMBECTOMY/INFUSION INTRACRANIAL INC DIAG ANGIO  11/25/2016  . IR RADIOLOGIST EVAL & MGMT  02/13/2017  . RADIOLOGY WITH ANESTHESIA N/A 11/25/2016   Procedure: RADIOLOGY WITH ANESTHESIA;  Surgeon: Radiologist, Medication, MD;  Location: [facility] OR;  Service: Radiology;  Laterality: N/A;    There were no vitals filed for this visit.  Subjective Assessment - 07/09/17 1453    Subjective  Patinet is doing fine today.No new concerns. Patient is doing his HEP; walking and doing his theraband exercises.    Pertinent History  Pt. is a 72 y.o. 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 Pt. was transferred to [facility]. He underwent CTA brain showed short segment near occlusion of proximal R-ICA with associated intraluminal  thrombus --likely due to dissection and emergent large vessel occlusion with right M2 occlusion and evolving right frontal lobe infarct.  He underwent cerebral angio with  complete revascularization of occluded superior division of R-MCA and near complete revascularization of proximal R-ICA with stent assisted angioplasty.  Stroke felt to be embolic in setting of ruptured plaque R-ICA and he was placed on ASA and brillinta due to stent. Pt. was transferred to inpatient rehab for several weeks, had home health therapy upon discharge.    Limitations  Walking;Lifting;House hold activities    How long can you sit comfortably?  unlimited     How long can you stand comfortably?  unlimited     How long can you walk comfortably?  unsure    Patient Stated Goals  "strengthen my whole L side"    Currently in Pain?  No/denies    Pain Score  0-No pain       Treatment: 1/2 foam tandem and ball toss with min assist and some loss of balance to the left side x 20 , to challenge dynamic balance and ankle stability  Matrix 22.5 lbs fwd / bwd x 5  And side to side with 17. 5 lbs x 5 over mat and  4 inch step, and rocker board x 5 repetitions to challenge dynamic balance and weight shifting  Leg press with  120 lbs single leg press x 20 x 2 and double leg press x 20 x 2, to progress strength  1/2 foam side by side feet and squat with red TB horizontal abd/add x 15   Rocker board and tandem feet and  with red TB horizontal abd/add x 15   Baps board middle ball DF/PF, med/lat and CCW, CW x 20   Patient needs CGA and min asssit and vc for correct technique and posture.                         PT Education - 07/09/17 1454    Education provided  Yes    Education Details  HEP    Person(s) Educated  Patient    Methods  Explanation    Comprehension  Verbalized understanding       PT Short Term Goals - 04/23/17 1442      PT SHORT TERM GOAL #1   Title  Pt will improve 5x sit to stand  to 18 seconds in order to demonstrate improved LE strength and improved dynamic balance.    Baseline  21 sec; 02/26/17: 16.1s    Time  6    Period  Weeks    Status  Achieved      PT SHORT TERM GOAL #2   Title  Pt will improve gait speed to at least 1.0 m/s with proper heel strike and decreased hip hike in order to demonstrate imrpoved LE strength and balance.    Baseline  0.83 m/s; 02/26/17: 1.11 m/s    Time  6    Period  Weeks    Status  Achieved      PT SHORT TERM GOAL #3   Title  Pt will improve Berg Balance score to 51/56 in order to demonstrate improved dynamic and static balance, decreasing overall falls risk.    Baseline  48/56; 02/26/17: 53/56    Time  6    Period  Weeks    Status  Achieved        PT Long Term Goals - 06/16/17 1308      PT LONG TERM GOAL #1   Title  Pt will demonstrate independence with HEP in order to continue LE strengthening and manage symptoms.    Time  12    Period  Weeks    Status  On-going    Target Date  08/11/17      PT LONG TERM GOAL #2   Title  Pt will improve 5x sit to stand to <15 seconds in order to demonstrate improved LE strength and dynamic balance.    Baseline  21 sec; 02/26/17: 16.1s 16.50 sec 04/23/17; 16 sec 05/26/17, 06/16/17 16 sec    Time  12    Period  Weeks    Status  On-going    Target Date  08/11/17      PT LONG TERM GOAL #3   Title  Pt will demonstrate gait speed of at least 1.2 m/s in order to demonstrate improved LE strength and improved balance in order to return to prior level of funciton.    Baseline  0.833 m/s 02/26/17: 1.11 m/s, 12/189/18 1.16 m/sec; 1.23 m/sec    Time  12    Period  Weeks    Status  Achieved    Target Date  06/16/17      PT LONG TERM GOAL #4   Title  Pt will improve Berg Balance score to at least  54/56 in order demonstrate improved dynamic and static balance, decreasing overall falls risk.    Baseline  48/56; 02/26/17: 53/56; 04/23/17  53/56    Time  12    Period  Weeks    Status   Achieved    Target Date  06/16/17      PT LONG TERM GOAL #5   Title  Pt will demonstrate improved LE strength to 4+/5 in all limited planes in order to return to prior level of function.    Baseline  LLE hip flex 4/5, abd 4+/5, ext 4/5, LLE knee 5/5, ankle 5/5    Time  12    Period  Weeks    Status  On-going    Target Date  08/11/17      Additional Long Term Goals   Additional Long Term Goals  Yes      PT LONG TERM GOAL #6   Title  Pt will improve to >1500 feet without using AD in order to return to prior level of function, demonstrating improvements in LE strength, balance and endurance.    Baseline  1205 ft without AD; 02/26/17: deferred to next session 04/23/17 1420 ft, 1525 feet    Time  12    Period  Weeks    Status  On-going    Target Date  08/11/17      PT LONG TERM GOAL #7   Title  Patient will perform TM walking elevated . 3 with increased speed and no loss of balance for 5 mins to be able to walk a golf  course.    Time  12    Period  Weeks    Status  New    Target Date  08/11/17            Plan - 07/09/17 1455    Clinical Impression Statement  Pt presents with unsteadiness on uneven surfaces and fatigues with therapeutic exercises. Patient needs assist with single leg balance activities and needs CGA assist with longer single leg timed standing activities. Patient demonstrates difficulty with dynamic standing balance and increased postural sway while on 1/2 foam , and rocker board with decreased base of support and increased challenges for UE.  Patient tolerated all interventions well this date and will benefit from continued skilled PT interventions to improve strength and balance and decrease risk of falling    Rehab Potential  Good    Clinical Impairments Affecting Rehab Potential  high prior level of function    PT Frequency  2x / week    PT Duration  12 weeks    PT Treatment/Interventions  ADLs/Self Care Home Management;Cryotherapy;Moist Heat;Gait  training;Stair training;Functional mobility training;Therapeutic activities;Therapeutic exercise;Balance training;Neuromuscular re-education;Patient/family education;Manual techniques    PT Next Visit Plan  , MMT, update those specific goals, continue strength/balance;    PT Home Exercise Plan  Add green theraband to HEP exercises    Consulted and Agree with Plan of Care  Patient       Patient will benefit from skilled therapeutic intervention in order to improve the following deficits and impairments:  Abnormal gait, Decreased balance, Decreased strength, Difficulty walking, Increased edema  Visit Diagnosis: Muscle weakness (generalized)  Other lack of coordination  Unsteadiness on feet  Acute ischemic stroke (HCC) - 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  Low vitamin B12 level 06/27/2017  . 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 Pure hypercholesterolemia 05/28/2017  . Erectile dysfunction 05/28/2017  . Left hemiparesis (HCC)   . Essential hypertension 11/29/2016  . 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 Leukopenia 03/31/2016  . Hepatitis C 07/22/2013    Ezekiel InaMansfield, Lovell Roe S, [place] DPT 07/09/2017, 3:29 PM  [place] [facility] MA[facility] [address] [place], [place], [address] Phone: [phone]   Fax:  [phone]  Name: Terrilee CroakJames Guo MRN: [id number] Date of Birth: 1946-04-21

## 2017-07-10 ENCOUNTER — Encounter: Payer: Self-pay | Admitting: Internal Medicine

## 2017-07-14 ENCOUNTER — Ambulatory Visit: Payer: Medicare Other | Admitting: Physical Therapy

## 2017-07-14 ENCOUNTER — Ambulatory Visit: Payer: Medicare Other | Admitting: Occupational Therapy

## 2017-07-14 ENCOUNTER — Encounter: Payer: Self-pay | Admitting: Physical Therapy

## 2017-07-14 ENCOUNTER — Encounter: Payer: Self-pay | Admitting: Occupational Therapy

## 2017-07-14 DIAGNOSIS — M6281 Muscle weakness (generalized): Secondary | ICD-10-CM

## 2017-07-14 DIAGNOSIS — R471 Dysarthria and anarthria: Secondary | ICD-10-CM | POA: Diagnosis not present

## 2017-07-14 DIAGNOSIS — R278 Other lack of coordination: Secondary | ICD-10-CM

## 2017-07-14 DIAGNOSIS — R2681 Unsteadiness on feet: Secondary | ICD-10-CM | POA: Diagnosis not present

## 2017-07-14 DIAGNOSIS — I639 Cerebral infarction, unspecified: Secondary | ICD-10-CM

## 2017-07-14 NOTE — Therapy (Addendum)
[place] [facility] [address] [place], [place], [address] Phone: [phone]   Fax:  [phone]  Occupational Therapy Treatment  Patient Details  Name: Jason CroakJames Nolan MRN: [id number] Date of Birth: August 30, 1945 Referring Provider: Lorre MunroeBAITY, REGINA W   Encounter Date: 07/14/2017  OT End of Session - 07/14/17 1356    Visit Number  32    Number of Visits  48    Date for OT Re-Evaluation  09/24/17    OT Start Time  1347    OT Stop Time  1430    OT Time Calculation (min)  43 min    Activity Tolerance  Patient tolerated treatment well    Behavior During Therapy  [facility] for tasks assessed/performed       Past Medical History:  Diagnosis Date  . Glaucoma   . Hepatitis C   . Stroke [facility](HCC)     Past Surgical History:  Procedure Laterality Date  . IR ANGIO INTRA EXTRACRAN SEL COM CAROTID INNOMINATE UNI L MOD SED  11/25/2016  . IR ANGIO VERTEBRAL SEL SUBCLAVIAN INNOMINATE UNI R MOD SED  11/25/2016  . IR ANGIO VERTEBRAL SEL VERTEBRAL UNI L MOD SED  11/25/2016  . IR INTRAVSC STENT CERV CAROTID W/O EMB-PROT MOD SED INC ANGIO  11/25/2016  . IR PERCUTANEOUS ART THROMBECTOMY/INFUSION INTRACRANIAL INC DIAG ANGIO  11/25/2016  . IR RADIOLOGIST EVAL & MGMT  02/13/2017  . RADIOLOGY WITH ANESTHESIA N/A 11/25/2016   Procedure: RADIOLOGY WITH ANESTHESIA;  Surgeon: Radiologist, Medication, MD;  Location: [facility] OR;  Service: Radiology;  Laterality: N/A;    There were no vitals filed for this visit.  Subjective Assessment - 07/14/17 1353    Subjective   Pt. had a tooth extracted on Friday.    Patient is accompained by:  Family member    Pertinent History  Pt. is a 72 y.o. 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 [facility] He underwent CTA brain showed short segment near occlusion of proximal R-ICA with associated intraluminal thrombus --likely due to dissection and emergent large vessel occlusion with right M2 occlusion and  evolving right frontal lobe infarct.  He underwent cerebral angio with  complete revascularization of occluded superior division of R-MCA and near complete revascularization of proximal R-ICA with stent assisted angioplasty.  Stroke felt to be embolic in setting of ruptured plaque R-ICA and he was placed on ASA and brillinta due to stent. Pt. was transferred to inpatient rehab for several weeks, had home health therapy upon discharge, and now is ready for outpatient therapy services.    Limitations  Dominant LUE functioning, left hand edema, distal ROM, coordination, and overal strength.    Currently in Pain?  No/denies       OT TREATMENT    Neuro muscular re-education:  Pt. worked on tasks to improve thumb opposition, graded storage of objects within the left hand. Pt. Worked on tasks to prepare the hand for translatory movements.  Therapeutic Exercise:  Pt. performed gross gripping with grip strengthener. Pt. worked on sustaining grip while grasping pegs and reaching at various heights. Gripper was placed in the 4th resistive slot with the white resistive spring. Pt. Worked on pinch strengthening in the left hand for lateral, and 3pt. pinch using yellow, red, green, and blue resistive clips. Pt. worked on placing the clips at various vertical and horizontal angles. Tactile and verbal cues were required for eliciting the desired movement. Emphasis was placed on grasping  the clips, and positioning it in the hand to prepare for placement.                         OT Education - 07/14/17 1354    Education provided  Yes    Education Details  LUE ROM, Hand Function, and FMC.    Person(s) Educated  Patient    Methods  Explanation    Comprehension  Verbalized understanding          OT Long Term Goals - 07/07/17 1424      OT LONG TERM GOAL #1   Title  Pt. will improve edema by 2 cm through MPs in preparation for ROM.    Baseline  improving 07/07/2017: wrist: 19cm., MPs:  24 cm.    Time  12    Period  Weeks    Status  On-going    Target Date  09/29/17      OT LONG TERM GOAL #2   Title  Pt. will improve Left hand digit extension in preparation for weightbearing, and releasing objects during ADLs/IADLs.    Baseline  Pt. is unable to achieve full digit extension on the left. 07/07/2017: Pt. is able to achieve full digit extension.    Time  12    Period  Weeks    Status  Achieved      OT LONG TERM GOAL #3   Title  Pt. left hand grip with  increase by 5# in preparation for holding a cup.    Baseline  Left 2#, RIght: 60 07/07/2017: Left: 13    Time  12    Period  Weeks    Status  On-going    Target Date  09/29/17      OT LONG TERM GOAL #4   Title  Pt. will increased right shoulder strength by 1 mm grade to assist with ADLs.    Baseline  07/07/2017: progressing.    Time  12    Period  Weeks      OT LONG TERM GOAL #5   Title  Pt. will increase left wrist extension in preparation for reaching ADL items.    Baseline  wrist extension 28 degrees, 07/07/2017: wrist extension: 65    Time  12    Period  Weeks    Status  Achieved      Long Term Additional Goals   Additional Long Term Goals  Yes      OT LONG TERM GOAL #6   Title  Pt. will improve left hand coordination skills to be able to button clothing.    Baseline  Pt. continues to have difficulty 07/07/2017: 4 min. via 9 hole peg test    Time  12    Period  Weeks    Status  On-going    Target Date  09/29/17      OT LONG TERM GOAL #7   Title  Pt. will independently demonstrate visual compensatory strategies duirng ADLs, and IADLs.    Baseline  Glaucoma    Time  12    Period  Weeks    Status  On-going      OT LONG TERM GOAL #8   Title  Pt. will increase Left pinch strength to be able to grasp and hold items during ADL tasks.    Baseline  07/07/04: lateral pinch: 8#, 3pt. pinch 6#    Time  12    Period  Weeks    Status  New    Target Date  09/29/17      OT LONG TERM GOAL  #9   Baseline  Pt. will  write one sentence with 75% legibility and efficiently while maintaing grasp on pen 100% of the time.    Time  12    Period  Weeks    Status  New    Target Date  09/29/17      OT LONG TERM GOAL  #10   TITLE  Pt. will independently use his left hand to grasp and efficiently accomodate to the varying weights of ADL objects during self care tasks.     Baseline  07/07/2017: Pt. has difficulty grasping and adjusting hand to the varying weights of objects.    Time  12    Period  Weeks    Status  New    Target Date  09/29/17            Plan - 07/14/17 1356    Clinical Impression Statement  Pt. had a wisdom tooth extracted on Friday. Pt. is making steady progress with LUE strength, edema control, hand function, and coordination. Pt. continues to work on improving grip strength, pinch strength, and tranlatory movements controlling movements of objects through the  left hand.  pt. in    Occupational performance deficits (Please refer to evaluation for details):  ADL's;IADL's    Rehab Potential  Excellent    OT Frequency  2x / week    OT Duration  12 weeks    OT Treatment/Interventions  Self-care/ADL training;Therapeutic exercise;Neuromuscular education;Patient/family education;Energy conservation;Therapeutic activities;DME and/or AE instruction    Clinical Decision Making  Multiple treatment options, significant modification of task necessary    Consulted and Agree with Plan of Care  Patient       Patient will benefit from skilled therapeutic intervention in order to improve the following deficits and impairments:  Abnormal gait, Pain, Decreased strength, Decreased range of motion, Impaired UE functional use, Decreased coordination, Decreased knowledge of use of DME, Decreased activity tolerance, Difficulty walking, Decreased balance, Other (comment)  Visit Diagnosis: Muscle weakness (generalized)  Other lack of coordination    Problem List Patient Active Problem List   Diagnosis  Date Noted  . Low vitamin B12 level 06/27/2017  . 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 Pure hypercholesterolemia 05/28/2017  . Erectile dysfunction 05/28/2017  . Left hemiparesis (HCC)   . Essential hypertension 11/29/2016  . 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 Leukopenia 03/31/2016  . Hepatitis C 07/22/2013    Olegario Messier, MS, OTR/L 07/14/2017, 2:20 PM  [place] [facility] MAIN [facility] SERVICES [address] [place], [place], [address] Phone: [phone]   Fax:  [phone]  Name: Griffen Frayne MRN: [id number] Date of Birth: [birth year]

## 2017-07-14 NOTE — Therapy (Signed)
[place] [facility] MAIN [employer] SERVICES [address] [place], [place], [address] Phone: [phone]   Fax:  [phone]  Physical Therapy Treatment  Patient Details  Name: Jason Nolan MRN: [id number] Date of Birth: 22-Dec-1945 Referring Provider: [name]   Encounter Date: 07/14/2017  PT End of Session - 07/14/17 1426    Visit Number  36    Number of Visits  71    Date for PT Re-Evaluation  09/10/17    PT Start Time  0102    PT Stop Time  0145    PT Time Calculation (min)  43 min    Equipment Utilized During Treatment  Gait belt    Activity Tolerance  Patient tolerated treatment well    Behavior During Therapy  [facility] for tasks assessed/performed       Past Medical History:  Diagnosis Date  . Glaucoma   . Hepatitis C   . Stroke [facility])     Past Surgical History:  Procedure Laterality Date  . IR ANGIO INTRA EXTRACRAN SEL COM CAROTID INNOMINATE UNI L MOD SED  11/25/2016  . IR ANGIO VERTEBRAL SEL SUBCLAVIAN INNOMINATE UNI R MOD SED  11/25/2016  . IR ANGIO VERTEBRAL SEL VERTEBRAL UNI L MOD SED  11/25/2016  . IR INTRAVSC STENT CERV CAROTID W/O EMB-PROT MOD SED INC ANGIO  11/25/2016  . IR PERCUTANEOUS ART THROMBECTOMY/INFUSION INTRACRANIAL INC DIAG ANGIO  11/25/2016  . IR RADIOLOGIST EVAL & MGMT  02/13/2017  . RADIOLOGY WITH ANESTHESIA N/A 11/25/2016   Procedure: RADIOLOGY WITH ANESTHESIA;  Surgeon: Radiologist, Medication, MD;  Location: [facility] OR;  Service: Radiology;  Laterality: N/A;    There were no vitals filed for this visit.  Subjective Assessment - 07/14/17 1426    Subjective  Patinet is doing fine today.No new concerns. Patient is doing his HEP; walking and doing his theraband exercises.    Pertinent History  Pt. is a 72 y.o. 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 Pt. was transferred to [facility]. He underwent CTA brain showed short segment near occlusion of proximal R-ICA with associated intraluminal  thrombus --likely due to dissection and emergent large vessel occlusion with right M2 occlusion and evolving right frontal lobe infarct.  He underwent cerebral angio with  complete revascularization of occluded superior division of R-MCA and near complete revascularization of proximal R-ICA with stent assisted angioplasty.  Stroke felt to be embolic in setting of ruptured plaque R-ICA and he was placed on ASA and brillinta due to stent. Pt. was transferred to inpatient rehab for several weeks, had home health therapy upon discharge.    Limitations  Walking;Lifting;House hold activities    How long can you sit comfortably?  unlimited     How long can you stand comfortably?  unlimited     How long can you walk comfortably?  unsure    Patient Stated Goals  "strengthen my whole L side"    Currently in Pain?  No/denies    Pain Score  0-No pain       Treatment: Hip hikes BLE x 20 on stairs with UE support  1/2 foam tandem and ball toss with min assist and some loss of balance to the left side x 20 , to challenge dynamic balance and ankle stability  Matrix 22.5 lbs fwd / bwd x 5 And side to side with 17. 5 lbs x 5 over mat and 4 inch step, and rocker board, stepping stones x 5 repetitions  to challenge dynamic balance and weight shifting  Leg press with 120 lbs single leg press x 20 x 2 and double leg press x 20 x 2, to progress strength  1/2 foam side by side feet and squat with red TB horizontal abd/add x 15   Rocker board and tandem feet and  with red TB horizontal abd/add x 15   Baps board middle ball DF/PF, med/lat and CCW, CW x 20   Patient needs CGA and min asssit and vc for correct technique and posture.                       PT Education - 07/14/17 1426    Education provided  Yes    Education Details  HEP progression    Person(s) Educated  Patient    Methods  Explanation;Tactile cues;Demonstration;Verbal cues    Comprehension  Verbalized  understanding;Returned demonstration;Verbal cues required;Tactile cues required       PT Short Term Goals - 04/23/17 1442      PT SHORT TERM GOAL #1   Title  Pt will improve 5x sit to stand to 18 seconds in order to demonstrate improved LE strength and improved dynamic balance.    Baseline  21 sec; 02/26/17: 16.1s    Time  6    Period  Weeks    Status  Achieved      PT SHORT TERM GOAL #2   Title  Pt will improve gait speed to at least 1.0 m/s with proper heel strike and decreased hip hike in order to demonstrate imrpoved LE strength and balance.    Baseline  0.83 m/s; 02/26/17: 1.11 m/s    Time  6    Period  Weeks    Status  Achieved      PT SHORT TERM GOAL #3   Title  Pt will improve Berg Balance score to 51/56 in order to demonstrate improved dynamic and static balance, decreasing overall falls risk.    Baseline  48/56; 02/26/17: 53/56    Time  6    Period  Weeks    Status  Achieved        PT Long Term Goals - 06/16/17 1308      PT LONG TERM GOAL #1   Title  Pt will demonstrate independence with HEP in order to continue LE strengthening and manage symptoms.    Time  12    Period  Weeks    Status  On-going    Target Date  08/11/17      PT LONG TERM GOAL #2   Title  Pt will improve 5x sit to stand to <15 seconds in order to demonstrate improved LE strength and dynamic balance.    Baseline  21 sec; 02/26/17: 16.1s 16.50 sec 04/23/17; 16 sec 05/26/17, 06/16/17 16 sec    Time  12    Period  Weeks    Status  On-going    Target Date  08/11/17      PT LONG TERM GOAL #3   Title  Pt will demonstrate gait speed of at least 1.2 m/s in order to demonstrate improved LE strength and improved balance in order to return to prior level of funciton.    Baseline  0.833 m/s 02/26/17: 1.11 m/s, 12/189/18 1.16 m/sec; 1.23 m/sec    Time  12    Period  Weeks    Status  Achieved    Target Date  06/16/17      PT  LONG TERM GOAL #4   Title  Pt will improve Berg Balance score to at least  54/56 in order demonstrate improved dynamic and static balance, decreasing overall falls risk.    Baseline  48/56; 02/26/17: 53/56; 04/23/17  53/56    Time  12    Period  Weeks    Status  Achieved    Target Date  06/16/17      PT LONG TERM GOAL #5   Title  Pt will demonstrate improved LE strength to 4+/5 in all limited planes in order to return to prior level of function.    Baseline  LLE hip flex 4/5, abd 4+/5, ext 4/5, LLE knee 5/5, ankle 5/5    Time  12    Period  Weeks    Status  On-going    Target Date  08/11/17      Additional Long Term Goals   Additional Long Term Goals  Yes      PT LONG TERM GOAL #6   Title  Pt will improve 6MWT to >1500 feet without using AD in order to return to prior level of function, demonstrating improvements in LE strength, balance and endurance.    Baseline  1205 ft without AD; 02/26/17: deferred to next session 04/23/17 1420 ft, 1525 feet    Time  12    Period  Weeks    Status  On-going    Target Date  08/11/17      PT LONG TERM GOAL #7   Title  Patient will perform TM walking elevated . 3 with increased speed and no loss of balance for 5 mins to be able to walk a golf  course.    Time  12    Period  Weeks    Status  New    Target Date  08/11/17            Plan - 07/14/17 1427    Clinical Impression Statement  Patient instructed in advanced LE strengthening exercise. Advanced HEP with seated and standing exercise. Patient requires min VCs for correct positioning and to slow down LE movement for better strengthening; He performs dynamic standing balance training exercises with advanced challenges.Marland Kitchen. He would benefit from additional skilled PT intervention to improve balance/gait safety and reduce fall risk.    Rehab Potential  Good    Clinical Impairments Affecting Rehab Potential  high prior level of function    PT Frequency  2x / week    PT Duration  12 weeks    PT Treatment/Interventions  ADLs/Self Care Home Management;Cryotherapy;Moist  Heat;Gait training;Stair training;Functional mobility training;Therapeutic activities;Therapeutic exercise;Balance training;Neuromuscular re-education;Patient/family education;Manual techniques    PT Next Visit Plan  6MWT, MMT, update those specific goals, continue strength/balance;    PT Home Exercise Plan  Add green theraband to HEP exercises    Consulted and Agree with Plan of Care  Patient       Patient will benefit from skilled therapeutic intervention in order to improve the following deficits and impairments:  Abnormal gait, Decreased balance, Decreased strength, Difficulty walking, Increased edema  Visit Diagnosis: Other lack of coordination  Muscle weakness (generalized)  Unsteadiness on feet  Acute ischemic stroke (HCC) - 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 Low vitamin B12 level 06/27/2017  . 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 Pure  hypercholesterolemia 05/28/2017  . Erectile dysfunction 05/28/2017  . Left hemiparesis (HCC)   . Essential hypertension 11/29/2016  . 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 Leukopenia 03/31/2016  . Hepatitis C 07/22/2013    Ezekiel Ina, [place] DPT 07/14/2017, 2:30 PM  [facility] MAIN [facility] SERVICES [address] [place], [place], [address] Phone: [phone]   Fax:  [phone]  Name: Joab Carden MRN: [id number] Date of Birth: 1946-01-09

## 2017-07-15 ENCOUNTER — Encounter: Payer: Medicare Other | Attending: Physical Medicine & Rehabilitation

## 2017-07-15 ENCOUNTER — Ambulatory Visit (HOSPITAL_BASED_OUTPATIENT_CLINIC_OR_DEPARTMENT_OTHER): Payer: Medicare Other | Admitting: Physical Medicine & Rehabilitation

## 2017-07-15 ENCOUNTER — Encounter: Payer: Self-pay | Admitting: Physical Medicine & Rehabilitation

## 2017-07-15 VITALS — BP 134/77 | HR 55 | Resp 14

## 2017-07-15 DIAGNOSIS — R600 Localized edema: Secondary | ICD-10-CM | POA: Insufficient documentation

## 2017-07-15 DIAGNOSIS — G8194 Hemiplegia, unspecified affecting left nondominant side: Secondary | ICD-10-CM | POA: Diagnosis not present

## 2017-07-15 DIAGNOSIS — I69352 Hemiplegia and hemiparesis following cerebral infarction affecting left dominant side: Secondary | ICD-10-CM | POA: Insufficient documentation

## 2017-07-15 DIAGNOSIS — H409 Unspecified glaucoma: Secondary | ICD-10-CM | POA: Diagnosis not present

## 2017-07-15 DIAGNOSIS — I69322 Dysarthria following cerebral infarction: Secondary | ICD-10-CM

## 2017-07-15 DIAGNOSIS — Z87891 Personal history of nicotine dependence: Secondary | ICD-10-CM | POA: Insufficient documentation

## 2017-07-15 DIAGNOSIS — I639 Cerebral infarction, unspecified: Secondary | ICD-10-CM

## 2017-07-15 NOTE — Patient Instructions (Signed)
Keep up with Home exercises

## 2017-07-15 NOTE — Progress Notes (Signed)
Subjective:  72 y.o. left handed male with history of Hep C-treated with Harvoni, glaucoma who [facility] with left sided weakness, facial droop and slurred speech due to early changes of R-MCA territory infarct.  He underwent CTA brain showed short segment near occlusion of proximal R-ICA with associated intraluminal thrombus --likely due to dissection and emergent large vessel occlusion with right M2 occlusion and evolving right frontal lobe infarct.  He underwent cerebral angio with  complete revascularization of occluded superior division of R-MCA and near complete revascularization of proximal R-ICA with stent assisted angioplasty.  Stroke felt to be embolic in setting of ruptured plaque R-ICA and he was placed on ASA and brillinta due to stent.  Patient with resultant left sided weakness, expressive deficits with aphonia and dysphagia  72 y.o. left handed male with history of Hep C-treated with Harvoni, glaucoma [facility] with left sided weakness, facial droop and slurred speech due to early changes of R-MCA territory infarct.  He underwent CTA brain showed short segment near occlusion of proximal R-ICA with associated intraluminal thrombus --likely due to dissection and emergent large vessel occlusion with right M2 occlusion and evolving right frontal lobe infarct.  He underwent cerebral angio with  complete revascularization of occluded superior division of R-MCA and near complete revascularization of proximal R-ICA with stent assisted angioplasty.  Stroke felt to be embolic in setting of ruptured plaque R-ICA and he was placed on ASA and brillinta due to stent.  Patient with resultant left sided weakness, expressive deficits with aphonia and dysphagia Admit date: 11/29/2016 Discharge date: 12/21/2016     Patient ID: Jason CroakJames Nolan, male    DOB: July 04, 1945, 72 y.o.   MRN: [id number]  HPI Completed OT, PT, SLP Pt makes his own breakfast Driving without  difficulties Mod I in all self care  No new issues  Doing some home exercises Bridging ex and clamshells at home Walking mainly inside the home  Pain Inventory Average Pain 0 Pain Right Now 0 My pain is no pain  In the last 24 hours, has pain interfered with the following? General activity 0 Relation with others 0 Enjoyment of life 0 What TIME of day is your pain at its worst? no pain Sleep (in general) Fair  Pain is worse with: no pain Pain improves with: no pain Relief from Meds: no pain  Mobility walk with assistance use a cane how many minutes can you walk? 30 ability to climb steps?  yes do you drive?  yes Do you have any goals in this area?  no  Function retired Do you have any goals in this area?  no  Neuro/Psych No problems in this area  Prior Studies Any changes since last visit?  no  Physicians involved in your care Any changes since last visit?  no   Family History  Problem Relation Age of Onset  . Hypertension Mother   . Hypertension Father    Social History   Socioeconomic History  . Marital status: Married    Spouse name: None  . Number of children: None  . Years of education: None  . Highest education level: None  Social Needs  . Financial resource strain: None  . Food insecurity - worry: None  . Food insecurity - inability: None  . Transportation needs - medical: None  . Transportation needs - non-medical: None  Occupational History  . None  Tobacco Use  . Smoking status: Former Smoker  Packs/day: 0.50    Years: 15.00    Pack years: 7.50    Last attempt to quit: 1990    Years since quitting: 29.2  . Smokeless tobacco: Never Used  Substance and Sexual Activity  . Alcohol use: No    Alcohol/week: 0.0 oz  . Drug use: No  . Sexual activity: Yes    Partners: Female    Birth control/protection: None  Other Topics Concern  . None  Social History Narrative  . None   Past Surgical History:  Procedure Laterality Date  .  IR ANGIO INTRA EXTRACRAN SEL COM CAROTID INNOMINATE UNI L MOD SED  11/25/2016  . IR ANGIO VERTEBRAL SEL SUBCLAVIAN INNOMINATE UNI R MOD SED  11/25/2016  . IR ANGIO VERTEBRAL SEL VERTEBRAL UNI L MOD SED  11/25/2016  . IR INTRAVSC STENT CERV CAROTID W/O EMB-PROT MOD SED INC ANGIO  11/25/2016  . IR PERCUTANEOUS ART THROMBECTOMY/INFUSION INTRACRANIAL INC DIAG ANGIO  11/25/2016  . IR RADIOLOGIST EVAL & MGMT  02/13/2017  . RADIOLOGY WITH ANESTHESIA N/A 11/25/2016   Procedure: RADIOLOGY WITH ANESTHESIA;  Surgeon: Radiologist, Medication, MD;  Location: [facility] OR;  Service: Radiology;  Laterality: N/A;   Past Medical History:  Diagnosis Date  . Glaucoma   . Hepatitis C   . Stroke (HCC)    BP 134/77 (BP Location: Right Arm, Patient Position: Sitting, Cuff Size: Normal)   Pulse (!) 55   Resp 14   SpO2 98%   Opioid Risk Score:   Fall Risk Score:  `1  Depression screen PHQ 2/9  Depression screen [facility] 2/9 05/28/2017 04/08/2017 12/31/2016 01/26/2016 10/27/2014 07/22/2013  Decreased Interest 0 0 0 0 0 0  Down, Depressed, Hopeless 0 0 0 0 0 0  PHQ - 2 Score 0 0 0 0 0 0  Altered sleeping - - 0 - - -  Tired, decreased energy - - 0 - - -  Change in appetite - - 0 - - -  Feeling bad or failure about yourself  - - 0 - - -  Trouble concentrating - - 0 - - -  Moving slowly or fidgety/restless - - 0 - - -  Suicidal thoughts - - 0 - - -  PHQ-9 Score - - 0 - - -  Difficult doing work/chores - - Not difficult at all - - -   Review of Systems  Constitutional: Negative.   HENT: Negative.   Eyes: Negative.   Respiratory: Negative.   Cardiovascular: Negative.   Gastrointestinal: Negative.   Endocrine: Negative.   Genitourinary: Negative.   Musculoskeletal: Negative.   Skin: Negative.   Allergic/Immunologic: Negative.   Neurological: Negative.   Hematological: Negative.   Psychiatric/Behavioral: Negative.   All other systems reviewed and are negative.      Objective:   Physical Exam  Constitutional: He  appears well-developed and well-nourished.  HENT:  Head: Normocephalic and atraumatic.  Eyes: Conjunctivae and EOM are normal. Pupils are equal, round, and reactive to light.  Neck: Normal range of motion.  Neurological:  Motor strength is 5/5 in the right deltoid, bicep, tricep, grip, hip flexor, knee extensor, ankle dorsiflexor 4- in the left deltoid, bicep, tricep, finger flexors and extensor 5 in the left hip flexor knee extensor ankle dorsiflexor  Psychiatric: He has a normal mood and affect. His behavior is normal. Judgment and thought content normal. His speech is slurred. Cognition and memory are normal.  Mild/moderate dysarthria  Nursing note and vitals reviewed.  Left dorsum of hand  swelling mild, nontender, no signs of synovitis  Decreased finger to thumb opposition 4/5 on Left side UE 5/5 on Right UE 5/5 BLE     Assessment & Plan:  #1.  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  His balance has improved nicely as has his left lower extremity strength. He is driving and is modified independent with all his self-care I do think he would benefit from continued outpatient OT but should be finishing up outpatient PT in the next couple weeks. I would like to see the patient back in 6-8 weeks to follow-up on his therapy progress.  We discussed longer term problems they can pop up after stroke such as post stroke seizures post stroke depression and spasticity.  Thus far he has not shown any signs of this.

## 2017-07-16 ENCOUNTER — Ambulatory Visit: Payer: Medicare Other | Admitting: Occupational Therapy

## 2017-07-16 ENCOUNTER — Ambulatory Visit: Payer: Medicare Other | Admitting: Physical Therapy

## 2017-07-16 DIAGNOSIS — R278 Other lack of coordination: Secondary | ICD-10-CM | POA: Diagnosis not present

## 2017-07-16 DIAGNOSIS — R471 Dysarthria and anarthria: Secondary | ICD-10-CM

## 2017-07-16 DIAGNOSIS — I639 Cerebral infarction, unspecified: Secondary | ICD-10-CM

## 2017-07-16 DIAGNOSIS — M6281 Muscle weakness (generalized): Secondary | ICD-10-CM | POA: Diagnosis not present

## 2017-07-16 DIAGNOSIS — R2681 Unsteadiness on feet: Secondary | ICD-10-CM | POA: Diagnosis not present

## 2017-07-16 NOTE — Therapy (Signed)
[facility] [address] [place], [place], [address] Phone: [phone]   Fax:  [phone]  Physical Therapy Treatment  Patient Details  Name: Jason CroakJames Nolan MRN: [id number] Date of Birth: 1946/02/03 Referring Provider: Lorre MunroeBAITY, REGINA W   Encounter Date: 07/16/2017  PT End of Session - 07/16/17 1447    Visit Number  37    Number of Visits  71    Date for PT Re-Evaluation  09/10/17    PT Start Time  0240    PT Stop Time  0318    PT Time Calculation (min)  38 min    Equipment Utilized During Treatment  Gait belt    Activity Tolerance  Patient tolerated treatment well    Behavior During Therapy  [facility] for tasks assessed/performed       Past Medical History:  Diagnosis Date  . Glaucoma   . Hepatitis C   . Stroke [facility](HCC)     Past Surgical History:  Procedure Laterality Date  . IR ANGIO INTRA EXTRACRAN SEL COM CAROTID INNOMINATE UNI L MOD SED  11/25/2016  . IR ANGIO VERTEBRAL SEL SUBCLAVIAN INNOMINATE UNI R MOD SED  11/25/2016  . IR ANGIO VERTEBRAL SEL VERTEBRAL UNI L MOD SED  11/25/2016  . IR INTRAVSC STENT CERV CAROTID W/O EMB-PROT MOD SED INC ANGIO  11/25/2016  . IR PERCUTANEOUS ART THROMBECTOMY/INFUSION INTRACRANIAL INC DIAG ANGIO  11/25/2016  . IR RADIOLOGIST EVAL & MGMT  02/13/2017  . RADIOLOGY WITH ANESTHESIA N/A 11/25/2016   Procedure: RADIOLOGY WITH ANESTHESIA;  Surgeon: Radiologist, Medication, MD;  Location: [facility] OR;  Service: Radiology;  Laterality: N/A;    There were no vitals filed for this visit.  Subjective Assessment - 07/16/17 1445    Subjective  Patinet is doing fine today.No new concerns. Patient is doing his HEP; walking and doing his theraband exercises.    Pertinent History  Pt. is a 72 y.o. 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 [facility] He underwent CTA brain showed short segment near occlusion of proximal R-ICA with associated intraluminal  thrombus --likely due to dissection and emergent large vessel occlusion with right M2 occlusion and evolving right frontal lobe infarct.  He underwent cerebral angio with  complete revascularization of occluded superior division of R-MCA and near complete revascularization of proximal R-ICA with stent assisted angioplasty.  Stroke felt to be embolic in setting of ruptured plaque R-ICA and he was placed on ASA and brillinta due to stent. Pt. was transferred to inpatient rehab for several weeks, had home health therapy upon discharge.    Limitations  Walking;Lifting;House hold activities    How long can you sit comfortably?  unlimited     How long can you stand comfortably?  unlimited     How long can you walk comfortably?  unsure    Patient Stated Goals  "strengthen my whole L side"    Currently in Pain?  No/denies    Pain Score  0-No pain    Multiple Pain Sites  No       Treatment:  1/2 foam tandem and ball toss with min assist and some loss of balance to the left side x 20 , to challenge dynamic balance and ankle stability  Matrix 22.5 lbs fwd / bwd x 5 And side to side with 17. 5 lbs x 5 over mat and 4 inch step, and rocker board x 5 repetitions to challenge dynamic balance  and weight shifting  Leg press with 120 lbs single leg press x 20 x 2 and double leg press x 20 x 2, to progress strength  1/2 foam side by side feet and squat with red TB horizontal abd/add x 15   Rocker board and tandem feet and  with red TB horizontal abd/add x 15   Baps board middle ball DF/PF, med/lat and CCW, CW x 20   1/2 foam iwht stepping stones left and right x 10 x 2 and CGA   Patient needs CGA and min asssit and vc for correct technique and posture.                        PT Education - 07/16/17 1446    Education provided  Yes    Education Details  HEP    Person(s) Educated  Patient    Methods  Explanation    Comprehension  Verbalized understanding       PT Short  Term Goals - 04/23/17 1442      PT SHORT TERM GOAL #1   Title  Pt will improve 5x sit to stand to 18 seconds in order to demonstrate improved LE strength and improved dynamic balance.    Baseline  21 sec; 02/26/17: 16.1s    Time  6    Period  Weeks    Status  Achieved      PT SHORT TERM GOAL #2   Title  Pt will improve gait speed to at least 1.0 m/s with proper heel strike and decreased hip hike in order to demonstrate imrpoved LE strength and balance.    Baseline  0.83 m/s; 02/26/17: 1.11 m/s    Time  6    Period  Weeks    Status  Achieved      PT SHORT TERM GOAL #3   Title  Pt will improve Berg Balance score to 51/56 in order to demonstrate improved dynamic and static balance, decreasing overall falls risk.    Baseline  48/56; 02/26/17: 53/56    Time  6    Period  Weeks    Status  Achieved        PT Long Term Goals - 07/16/17 1458      PT LONG TERM GOAL #1   Title  Pt will demonstrate independence with HEP in order to continue LE strengthening and manage symptoms.    Time  12    Period  Weeks    Status  On-going    Target Date  08/11/17      PT LONG TERM GOAL #2   Title  Pt will improve 5x sit to stand to <15 seconds in order to demonstrate improved LE strength and dynamic balance.    Baseline  21 sec; 02/26/17: 16.1s 16.50 sec 04/23/17; 16 sec 05/26/17, 06/16/17 16 sec, 07/16/17= 14.19 sec    Time  12    Period  Weeks    Status  Achieved    Target Date  08/11/17      PT LONG TERM GOAL #3   Title  Pt will demonstrate gait speed of at least 1.2 m/s in order to demonstrate improved LE strength and improved balance in order to return to prior level of funciton.    Baseline  0.833 m/s 02/26/17: 1.11 m/s, 12/189/18 1.16 m/sec; 1.23 m/sec,     Time  12    Period  Weeks    Status  Achieved  PT LONG TERM GOAL #4   Title  Pt will improve Berg Balance score to at least 54/56 in order demonstrate improved dynamic and static balance, decreasing overall falls risk.     Baseline  48/56; 02/26/17: 53/56; 04/23/17  53/56     Time  12    Period  Weeks    Status  Achieved      PT LONG TERM GOAL #5   Title  Pt will demonstrate improved LE strength to 4+/5 in all limited planes in order to return to prior level of function.    Baseline  LLE hip flex 4/5, abd 4+/5, ext 4/5, LLE knee 5/5, ankle 5/5 LLE hip flex 4+/5, abd 4+/5, ankle 5/5    Time  12    Period  Weeks    Status  On-going    Target Date  08/11/17      PT LONG TERM GOAL #6   Title  Pt will improve to >1500 feet without using AD in order to return to prior level of function, demonstrating improvements in LE strength, balance and endurance.    Baseline  1205 ft without AD; 02/26/17: deferred to next session 04/23/17 1420 ft, 1525 feet    Time  12    Period  Weeks    Status  Achieved      PT LONG TERM GOAL #7   Title  Patient will perform TM walking elevated . 3 with increased speed and no loss of balance for 5 mins to be able to walk a golf  course.    Time  12    Period  Weeks    Status  On-going    Target Date  08/11/17            Plan - 07/16/17 1449    Clinical Impression Statement  Patient instructed in advanced balance and coordination exercise. He required mod VCs and min A for advanced exercise to improve weight shift and postural control. Patient requires min VCs to improve foot clearance with gait tasks and when negotiating ladder. Patient would benefit from additional skilled PT intervention to improve balance/gait safety and reduce fall risk;    Rehab Potential  Good    Clinical Impairments Affecting Rehab Potential  high prior level of function    PT Frequency  2x / week    PT Duration  12 weeks    PT Treatment/Interventions  ADLs/Self Care Home Management;Cryotherapy;Moist Heat;Gait training;Stair training;Functional mobility training;Therapeutic activities;Therapeutic exercise;Balance training;Neuromuscular re-education;Patient/family education;Manual techniques    PT  Next Visit Plan  , MMT, update those specific goals, continue strength/balance;    PT Home Exercise Plan  Add green theraband to HEP exercises    Consulted and Agree with Plan of Care  Patient       Patient will benefit from skilled therapeutic intervention in order to improve the following deficits and impairments:  Abnormal gait, Decreased balance, Decreased strength, Difficulty walking, Increased edema  Visit Diagnosis: Muscle weakness (generalized)  Other lack of coordination  Unsteadiness on feet  Acute ischemic stroke (HCC) - 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 Low vitamin B12 level 06/27/2017  . 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 Pure hypercholesterolemia 05/28/2017  . Erectile dysfunction 05/28/2017  . Left hemiparesis (HCC)   . Essential hypertension  11/29/2016  . 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 Leukopenia 03/31/2016  . Hepatitis C 07/22/2013    Ezekiel Ina, Merrill DPT 07/16/2017, 3:33 PM  [facility] MAIN [facility] SERVICES [address] [place], [place], [address] Phone: [phone]   Fax:  [phone]  Name: Jason Nolan MRN: [id number] Date of Birth: 03-20-1946

## 2017-07-17 NOTE — Therapy (Signed)
[facility] MAIN [facility] SERVICES [address] [place], [place], [address] Phone: [phone]   Fax:  [phone]  Occupational Therapy Treatment  Patient Details  Name: Jason Nolan MRN: [id number] Date of Birth: July 15, 1945 Referring Provider: [name]   Encounter Date: 07/16/2017  OT End of Session - 07/16/17 1816    Visit Number  33    Number of Visits  48    Date for OT Re-Evaluation  09/24/17    OT Start Time  1520    OT Stop Time  1600    OT Time Calculation (min)  40 min    Activity Tolerance  Patient tolerated treatment well    Behavior During Therapy  [facility] for tasks assessed/performed       Past Medical History:  Diagnosis Date  . Glaucoma   . Hepatitis C   . Stroke [facility]     Past Surgical History:  Procedure Laterality Date  . IR ANGIO INTRA EXTRACRAN SEL COM CAROTID INNOMINATE UNI L MOD SED  11/25/2016  . IR ANGIO VERTEBRAL SEL SUBCLAVIAN INNOMINATE UNI R MOD SED  11/25/2016  . IR ANGIO VERTEBRAL SEL VERTEBRAL UNI L MOD SED  11/25/2016  . IR INTRAVSC STENT CERV CAROTID W/O EMB-PROT MOD SED INC ANGIO  11/25/2016  . IR PERCUTANEOUS ART THROMBECTOMY/INFUSION INTRACRANIAL INC DIAG ANGIO  11/25/2016  . IR RADIOLOGIST EVAL & MGMT  02/13/2017  . RADIOLOGY WITH ANESTHESIA N/A 11/25/2016   Procedure: RADIOLOGY WITH ANESTHESIA;  Surgeon: Radiologist, Medication, MD;  Location: [facility] OR;  Service: Radiology;  Laterality: N/A;    There were no vitals filed for this visit.  Subjective Assessment - 07/16/17 1815    Subjective   Pt. finished with PT today    Patient is accompained by:  Family member    Pertinent History  Pt. is a 72 y.o. 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 Pt. was transferred to [facility]. He underwent CTA brain showed short segment near occlusion of proximal R-ICA with associated intraluminal thrombus --likely due to dissection and emergent large vessel occlusion with right M2 occlusion and evolving  right frontal lobe infarct.  He underwent cerebral angio with  complete revascularization of occluded superior division of R-MCA and near complete revascularization of proximal R-ICA with stent assisted angioplasty.  Stroke felt to be embolic in setting of ruptured plaque R-ICA and he was placed on ASA and brillinta due to stent. Pt. was transferred to inpatient rehab for several weeks, had home health therapy upon discharge, and now is ready for outpatient therapy services.    Limitations  Dominant LUE functioning, left hand edema, distal ROM, coordination, and overal strength.    Currently in Pain?  No/denies       OT TREATMENT    Neuro muscular re-education:   Pt. Worked on translatory movements of the hand moving 1" objects through his hand with fingers only out to the palm, followed by thumb only within the palm. Pt. Worked on graded flexion, and accommodating his left to the changes in varying weights of objects.  Therapeutic Exercise:  Pt. Worked on the [employer] for 8 min. With constant monitoring of the BUEs. Pt. Worked on changing, and alternating forward reverse position every 2 min. Level 4.5. Pt. performed gross gripping with grip strengthener. Pt. worked on sustaining grip while grasping pegs and reaching at various heights. Gripper was placed in the 4th resistive slot with the white resistive spring.  OT Education - 07/16/17 1815    Education provided  Yes    Education Details  [facility]    Person(s) Educated  Patient    Methods  Explanation    Comprehension  Verbalized understanding          OT Long Term Goals - 07/07/17 1424      OT LONG TERM GOAL #1   Title  Pt. will improve edema by 2 cm through MPs in preparation for ROM.    Baseline  improving 07/07/2017: wrist: 19cm., MPs: 24 cm.    Time  12    Period  Weeks    Status  On-going    Target Date  09/29/17      OT LONG TERM GOAL #2   Title  Pt. will improve Left hand digit  extension in preparation for weightbearing, and releasing objects during ADLs/IADLs.    Baseline  Pt. is unable to achieve full digit extension on the left. 07/07/2017: Pt. is able to achieve full digit extension.    Time  12    Period  Weeks    Status  Achieved      OT LONG TERM GOAL #3   Title  Pt. left hand grip with  increase by 5# in preparation for holding a cup.    Baseline  Left 2#, RIght: 60 07/07/2017: Left: 13    Time  12    Period  Weeks    Status  On-going    Target Date  09/29/17      OT LONG TERM GOAL #4   Title  Pt. will increased right shoulder strength by 1 mm grade to assist with ADLs.    Baseline  07/07/2017: progressing.    Time  12    Period  Weeks      OT LONG TERM GOAL #5   Title  Pt. will increase left wrist extension in preparation for reaching ADL items.    Baseline  wrist extension 28 degrees, 07/07/2017: wrist extension: 65    Time  12    Period  Weeks    Status  Achieved      Long Term Additional Goals   Additional Long Term Goals  Yes      OT LONG TERM GOAL #6   Title  Pt. will improve left hand coordination skills to be able to button clothing.    Baseline  Pt. continues to have difficulty 07/07/2017: 4 min. via 9 hole peg test    Time  12    Period  Weeks    Status  On-going    Target Date  09/29/17      OT LONG TERM GOAL #7   Title  Pt. will independently demonstrate visual compensatory strategies duirng ADLs, and IADLs.    Baseline  Glaucoma    Time  12    Period  Weeks    Status  On-going      OT LONG TERM GOAL #8   Title  Pt. will increase Left pinch strength to be able to grasp and hold items during ADL tasks.    Baseline  07/07/04: lateral pinch: 8#, 3pt. pinch 6#    Time  12    Period  Weeks    Status  New    Target Date  09/29/17      OT LONG TERM GOAL  #9   Baseline  Pt. will write one sentence with 75% legibility and efficiently while maintaing grasp on pen 100% of the  time.    Time  12    Period  Weeks    Status  New     Target Date  09/29/17      OT LONG TERM GOAL  #10   TITLE  Pt. will independently use his left hand to grasp and efficiently accomodate to the varying weights of ADL objects during self care tasks.     Baseline  07/07/2017: Pt. has difficulty grasping and adjusting hand to the varying weights of objects.    Time  12    Period  Weeks    Status  New    Target Date  09/29/17            Plan - 07/16/17 1816    Clinical Impression Statement Pt. saw Dr. Wynn Banker yesterday, and will follow-up again in May. Pt. continues to present with limited LUE strength, and coordination skills. Pt. is motovated,a dn continues to work on improving translatory movements of the hand, and thumb oppposition in preprartion for functional hand movements during ADLs, and IADLs.    Occupational performance deficits (Please refer to evaluation for details):  ADL's;IADL's    Rehab Potential  Excellent    OT Frequency  2x / week    OT Duration  12 weeks    OT Treatment/Interventions  Self-care/ADL training;Therapeutic exercise;Neuromuscular education;Patient/family education;Energy conservation;Therapeutic activities;DME and/or AE instruction    Clinical Decision Making  Multiple treatment options, significant modification of task necessary    Consulted and Agree with Plan of Care  Patient       Patient will benefit from skilled therapeutic intervention in order to improve the following deficits and impairments:  Abnormal gait, Pain, Decreased strength, Decreased range of motion, Impaired UE functional use, Decreased coordination, Decreased knowledge of use of DME, Decreased activity tolerance, Difficulty walking, Decreased balance, Other (comment)  Visit Diagnosis: Muscle weakness (generalized)  Other lack of coordination    Problem List Patient Active Problem List   Diagnosis Date Noted  . Low vitamin B12 level 06/27/2017  . 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 Pure hypercholesterolemia  05/28/2017  . Erectile dysfunction 05/28/2017  . Left hemiparesis (HCC)   . Essential hypertension 11/29/2016  . 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 Leukopenia 03/31/2016  . Hepatitis C 07/22/2013    Olegario Messier, MS, OTR/L 07/17/2017, 8:45 AM  [facility] MAIN [facility] SERVICES [address] [place], [place], [address] Phone: [phone]   Fax:  [phone]  Name: Jason Nolan MRN: [id number] Date of Birth: 10/22/1945

## 2017-07-21 ENCOUNTER — Encounter: Payer: Self-pay | Admitting: Physical Therapy

## 2017-07-21 ENCOUNTER — Ambulatory Visit: Payer: Medicare Other | Admitting: Occupational Therapy

## 2017-07-21 ENCOUNTER — Encounter: Payer: Self-pay | Admitting: Occupational Therapy

## 2017-07-21 ENCOUNTER — Ambulatory Visit: Payer: Medicare Other | Admitting: Physical Therapy

## 2017-07-21 DIAGNOSIS — M6281 Muscle weakness (generalized): Secondary | ICD-10-CM

## 2017-07-21 DIAGNOSIS — R471 Dysarthria and anarthria: Secondary | ICD-10-CM | POA: Diagnosis not present

## 2017-07-21 DIAGNOSIS — R278 Other lack of coordination: Secondary | ICD-10-CM | POA: Diagnosis not present

## 2017-07-21 DIAGNOSIS — I639 Cerebral infarction, unspecified: Secondary | ICD-10-CM

## 2017-07-21 DIAGNOSIS — R2681 Unsteadiness on feet: Secondary | ICD-10-CM

## 2017-07-21 NOTE — Therapy (Addendum)
[facility] MAIN [facility] SERVICES [address] [place], [place], [address] Phone: [phone]   Fax:  [phone]  Occupational Therapy Treatment  Patient Details  Name: Jason Nolan MRN: [id number] Date of Birth: [birth year] Referring Provider: [name]   Encounter Date: 07/21/2017  OT End of Session - 07/21/17 1352    Visit Number  34    Number of Visits  48    Date for OT Re-Evaluation  09/29/17    OT Start Time  1345    OT Stop Time  1430    OT Time Calculation (min)  45 min    Activity Tolerance  Patient tolerated treatment well    Behavior During Therapy  [facility] for tasks assessed/performed       Past Medical History:  Diagnosis Date  . Glaucoma   . Hepatitis C   . Stroke [facility])     Past Surgical History:  Procedure Laterality Date  . IR ANGIO INTRA EXTRACRAN SEL COM CAROTID INNOMINATE UNI L MOD SED  11/25/2016  . IR ANGIO VERTEBRAL SEL SUBCLAVIAN INNOMINATE UNI R MOD SED  11/25/2016  . IR ANGIO VERTEBRAL SEL VERTEBRAL UNI L MOD SED  11/25/2016  . IR INTRAVSC STENT CERV CAROTID W/O EMB-PROT MOD SED INC ANGIO  11/25/2016  . IR PERCUTANEOUS ART THROMBECTOMY/INFUSION INTRACRANIAL INC DIAG ANGIO  11/25/2016  . IR RADIOLOGIST EVAL & MGMT  02/13/2017  . RADIOLOGY WITH ANESTHESIA N/A 11/25/2016   Procedure: RADIOLOGY WITH ANESTHESIA;  Surgeon: Radiologist, Medication, MD;  Location: [facility] OR;  Service: Radiology;  Laterality: N/A;    There were no vitals filed for this visit.  Subjective Assessment - 07/21/17 1351    Subjective   Pt. report he finishes with PT at the end of this month.    Patient is accompained by:  Family member    Pertinent History  Pt. is a 72 y.o. 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 Pt. was transferred to [facility]. He underwent CTA brain showed short segment near occlusion of proximal R-ICA with associated intraluminal thrombus --likely due to dissection and emergent large vessel occlusion with  right M2 occlusion and evolving right frontal lobe infarct.  He underwent cerebral angio with  complete revascularization of occluded superior division of R-MCA and near complete revascularization of proximal R-ICA with stent assisted angioplasty.  Stroke felt to be embolic in setting of ruptured plaque R-ICA and he was placed on ASA and brillinta due to stent. Pt. was transferred to inpatient rehab for several weeks, had home health therapy upon discharge, and now is ready for outpatient therapy services.    Limitations  Dominant LUE functioning, left hand edema, distal ROM, coordination, and overal strength.    Patient Stated Goals  To regain the use of his LUE.    Currently in Pain?  No/denies      OT TREATMENT    Neuro muscular re-education:  Pt. worked on grasping 1" resistive cubes alternating thumb opposition to the tip of the 2nd through 5th digits while the board is placed at a vertical angle. Pt. worked on pressing the cubes back into place while alternating isolated 2nd digit extension. Pt. worked on grasping, and stacking 2" large pegs on the [employer] placed at a tabletop surface. Pt. Worked on flipping cards using LUE thumb on fingers, and fingers on thumb.  OT Education - 07/21/17 1352    Education provided  Yes    Education Details  HEP, [facility]    Person(s) Educated  Patient    Methods  Explanation    Comprehension  Verbalized understanding          OT Long Term Goals - 07/07/17 1424      OT LONG TERM GOAL #1   Title  Pt. will improve edema by 2 cm through MPs in preparation for ROM.    Baseline  improving 07/07/2017: wrist: 19cm., MPs: 24 cm.    Time  12    Period  Weeks    Status  On-going    Target Date  09/29/17      OT LONG TERM GOAL #2   Title  Pt. will improve Left hand digit extension in preparation for weightbearing, and releasing objects during ADLs/IADLs.    Baseline  Pt. is unable to achieve full digit  extension on the left. 07/07/2017: Pt. is able to achieve full digit extension.    Time  12    Period  Weeks    Status  Achieved      OT LONG TERM GOAL #3   Title  Pt. left hand grip with  increase by 5# in preparation for holding a cup.    Baseline  Left 2#, RIght: 60 07/07/2017: Left: 13    Time  12    Period  Weeks    Status  On-going    Target Date  09/29/17      OT LONG TERM GOAL #4   Title  Pt. will increased right shoulder strength by 1 mm grade to assist with ADLs.    Baseline  07/07/2017: progressing.    Time  12    Period  Weeks      OT LONG TERM GOAL #5   Title  Pt. will increase left wrist extension in preparation for reaching ADL items.    Baseline  wrist extension 28 degrees, 07/07/2017: wrist extension: 65    Time  12    Period  Weeks    Status  Achieved      Long Term Additional Goals   Additional Long Term Goals  Yes      OT LONG TERM GOAL #6   Title  Pt. will improve left hand coordination skills to be able to button clothing.    Baseline  Pt. continues to have difficulty 07/07/2017: 4 min. via 9 hole peg test    Time  12    Period  Weeks    Status  On-going    Target Date  09/29/17      OT LONG TERM GOAL #7   Title  Pt. will independently demonstrate visual compensatory strategies duirng ADLs, and IADLs.    Baseline  Glaucoma    Time  12    Period  Weeks    Status  On-going      OT LONG TERM GOAL #8   Title  Pt. will increase Left pinch strength to be able to grasp and hold items during ADL tasks.    Baseline  07/07/04: lateral pinch: 8#, 3pt. pinch 6#    Time  12    Period  Weeks    Status  New    Target Date  09/29/17      OT LONG TERM GOAL  #9   Baseline  Pt. will write one sentence with 75% legibility and efficiently while maintaing grasp on pen 100% of  the time.    Time  12    Period  Weeks    Status  New    Target Date  09/29/17      OT LONG TERM GOAL  #10   TITLE  Pt. will independently use his left hand to grasp and efficiently  accomodate to the varying weights of ADL objects during self care tasks.     Baseline  07/07/2017: Pt. has difficulty grasping and adjusting hand to the varying weights of objects.    Time  12    Period  Weeks    Status  New    Target Date  09/29/17            Plan - 07/21/17 1354    Clinical Impression Statement Pt. reports he was able to clip his finger nails with a clipper this weekend. Pt. continues to work on improving LUE functioning. Pt. continues to require weightbearing alternating with coordination. Pt. continues to present with LUE stiffness.    Occupational performance deficits (Please refer to evaluation for details):  ADL's;IADL's    Rehab Potential  Excellent    OT Frequency  2x / week    OT Duration  12 weeks    OT Treatment/Interventions  Self-care/ADL training;Therapeutic exercise;Neuromuscular education;Patient/family education;Energy conservation;Therapeutic activities;DME and/or AE instruction    Clinical Decision Making  Multiple treatment options, significant modification of task necessary    Consulted and Agree with Plan of Care  Patient       Patient will benefit from skilled therapeutic intervention in order to improve the following deficits and impairments:  Abnormal gait, Pain, Decreased strength, Decreased range of motion, Impaired UE functional use, Decreased coordination, Decreased knowledge of use of DME, Decreased activity tolerance, Difficulty walking, Decreased balance, Other (comment)  Visit Diagnosis: Muscle weakness (generalized)  Other lack of coordination    Problem List Patient Active Problem List   Diagnosis Date Noted  . Low vitamin B12 level 06/27/2017  . 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 Pure hypercholesterolemia 05/28/2017  . Erectile dysfunction 05/28/2017  . Left hemiparesis (HCC)   . Essential hypertension 11/29/2016  . Acute ischemic stroke (HCC) - R MCA embolic stroke in setting of ruptured R ICA plaque w  dissection, s/p R MCA stent and thrombectomy 11/25/2016  . Leukopenia 03/31/2016  . Hepatitis C 07/22/2013    Olegario MessierElaine Karli Wickizer, MS, OTR/L 07/21/2017, 2:18 PM  [place] [facility] MAIN [facility] [address] [place], [place], [address] Phone: [phone]   Fax:  [phone]  Name: Jason Nolan MRN: [id number] Date of Birth: [birth year]

## 2017-07-21 NOTE — Therapy (Signed)
[facility] MAIN [facility] SERVICES [address] [place], [place], [address] Phone: [phone]   Fax:  [phone]  Physical Therapy Treatment  Patient Details  Name: Jason Nolan MRN: [id number] Date of Birth: 1945-08-30 Referring Provider: [name]   Encounter Date: 07/21/2017  PT End of Session - 07/21/17 1308    Visit Number  38    Number of Visits  71    Date for PT Re-Evaluation  09/10/17    PT Start Time  0101    PT Stop Time  0141    PT Time Calculation (min)  40 min    Equipment Utilized During Treatment  Gait belt    Activity Tolerance  Patient tolerated treatment well    Behavior During Therapy  [facility] for tasks assessed/performed       Past Medical History:  Diagnosis Date  . Glaucoma   . Hepatitis C   . Stroke [facility])     Past Surgical History:  Procedure Laterality Date  . IR ANGIO INTRA EXTRACRAN SEL COM CAROTID INNOMINATE UNI L MOD SED  11/25/2016  . IR ANGIO VERTEBRAL SEL SUBCLAVIAN INNOMINATE UNI R MOD SED  11/25/2016  . IR ANGIO VERTEBRAL SEL VERTEBRAL UNI L MOD SED  11/25/2016  . IR INTRAVSC STENT CERV CAROTID W/O EMB-PROT MOD SED INC ANGIO  11/25/2016  . IR PERCUTANEOUS ART THROMBECTOMY/INFUSION INTRACRANIAL INC DIAG ANGIO  11/25/2016  . IR RADIOLOGIST EVAL & MGMT  02/13/2017  . RADIOLOGY WITH ANESTHESIA N/A 11/25/2016   Procedure: RADIOLOGY WITH ANESTHESIA;  Surgeon: Radiologist, Medication, MD;  Location: [facility] OR;  Service: Radiology;  Laterality: N/A;    There were no vitals filed for this visit.  Subjective Assessment - 07/21/17 1306    Subjective  Patient is doing well today.     Pertinent History  Pt. is a 72 y.o. 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 Pt. was transferred to [facility]. He underwent CTA brain showed short segment near occlusion of proximal R-ICA with associated intraluminal thrombus --likely due to dissection and emergent large vessel occlusion with right M2  occlusion and evolving right frontal lobe infarct.  He underwent cerebral angio with  complete revascularization of occluded superior division of R-MCA and near complete revascularization of proximal R-ICA with stent assisted angioplasty.  Stroke felt to be embolic in setting of ruptured plaque R-ICA and he was placed on ASA and brillinta due to stent. Pt. was transferred to inpatient rehab for several weeks, had home health therapy upon discharge.    Limitations  Walking;Lifting;House hold activities    How long can you sit comfortably?  unlimited     How long can you stand comfortably?  unlimited     How long can you walk comfortably?  unsure    Patient Stated Goals  "strengthen my whole L side"    Currently in Pain?  No/denies    Pain Score  0-No pain      Treatment:  Patient performed gym exercises including elliptic machine  x 3 mins  Knee extension 43 lbs x 15 x 2  Knee flex x 63 lbs x 15 x 2  Leg press 118 lbs x 15 x 2  Leg press heel raise x 15 x 2 , 60 lbs  Neuromuscular training:  baps board fwd/ bwd, side to side and CW, CCW x 20  Medium ball  2 disk yellow and green with tandem stand and horizontal ball left and  right x 20 and change foot position.    BOSU ball squats x 10  Wit 5 sec hold flat side up , flat side down   CGA and Min to mod verbal cues used throughout with increased in postural sway and LOB most seen with narrow base of support and while on uneven surfaces. Continues to have balance deficits typical with diagnosis. Patient performs high level exercises and needs verbal cuing for postural alignment and head positioning                    PT Education - 07/21/17 1307    Education provided  Yes    Education Details  HEp    Person(s) Educated  Patient    Methods  Explanation    Comprehension  Verbalized understanding       PT Short Term Goals - 04/23/17 1442      PT SHORT TERM GOAL #1   Title  Pt will improve 5x sit to stand to 18  seconds in order to demonstrate improved LE strength and improved dynamic balance.    Baseline  21 sec; 02/26/17: 16.1s    Time  6    Period  Weeks    Status  Achieved      PT SHORT TERM GOAL #2   Title  Pt will improve gait speed to at least 1.0 m/s with proper heel strike and decreased hip hike in order to demonstrate imrpoved LE strength and balance.    Baseline  0.83 m/s; 02/26/17: 1.11 m/s    Time  6    Period  Weeks    Status  Achieved      PT SHORT TERM GOAL #3   Title  Pt will improve Berg Balance score to 51/56 in order to demonstrate improved dynamic and static balance, decreasing overall falls risk.    Baseline  48/56; 02/26/17: 53/56    Time  6    Period  Weeks    Status  Achieved        PT Long Term Goals - 07/16/17 1458      PT LONG TERM GOAL #1   Title  Pt will demonstrate independence with HEP in order to continue LE strengthening and manage symptoms.    Time  12    Period  Weeks    Status  On-going    Target Date  08/11/17      PT LONG TERM GOAL #2   Title  Pt will improve 5x sit to stand to <15 seconds in order to demonstrate improved LE strength and dynamic balance.    Baseline  21 sec; 02/26/17: 16.1s 16.50 sec 04/23/17; 16 sec 05/26/17, 06/16/17 16 sec, 07/16/17= 14.19 sec    Time  12    Period  Weeks    Status  Achieved    Target Date  08/11/17      PT LONG TERM GOAL #3   Title  Pt will demonstrate gait speed of at least 1.2 m/s in order to demonstrate improved LE strength and improved balance in order to return to prior level of funciton.    Baseline  0.833 m/s 02/26/17: 1.11 m/s, 12/189/18 1.16 m/sec; 1.23 m/sec,     Time  12    Period  Weeks    Status  Achieved      PT LONG TERM GOAL #4   Title  Pt will improve Berg Balance score to at least 54/56 in order demonstrate improved dynamic and static  balance, decreasing overall falls risk.    Baseline  48/56; 02/26/17: 53/56; 04/23/17  53/56     Time  12    Period  Weeks    Status  Achieved       PT LONG TERM GOAL #5   Title  Pt will demonstrate improved LE strength to 4+/5 in all limited planes in order to return to prior level of function.    Baseline  LLE hip flex 4/5, abd 4+/5, ext 4/5, LLE knee 5/5, ankle 5/5 LLE hip flex 4+/5, abd 4+/5, ankle 5/5    Time  12    Period  Weeks    Status  On-going    Target Date  08/11/17      PT LONG TERM GOAL #6   Title  Pt will improve 6MWT to >1500 feet without using AD in order to return to prior level of function, demonstrating improvements in LE strength, balance and endurance.    Baseline  1205 ft without AD; 02/26/17: deferred to next session 04/23/17 1420 ft, 1525 feet    Time  12    Period  Weeks    Status  Achieved      PT LONG TERM GOAL #7   Title  Patient will perform TM walking elevated . 3 with increased speed and no loss of balance for 5 mins to be able to walk a golf  course.    Time  12    Period  Weeks    Status  On-going    Target Date  08/11/17            Plan - 07/21/17 1334    Clinical Impression Statement  Pt was able to progress through balance and strength exercises today with increases in pain during standing activities Pt continues to demonstrate improvement with dynamic and static balance, with decreased LOB noted with dynamic activities on even and uneven surfaces.  Pt has decreased single leg stability with L > R.  Pt would continue to benefit from skilled therapy services in order to continue strengthening B LE's and improving dynamic and static balance, especially single limb stability.    Rehab Potential  Good    Clinical Impairments Affecting Rehab Potential  high prior level of function    PT Frequency  2x / week    PT Duration  12 weeks    PT Treatment/Interventions  ADLs/Self Care Home Management;Cryotherapy;Moist Heat;Gait training;Stair training;Functional mobility training;Therapeutic activities;Therapeutic exercise;Balance training;Neuromuscular re-education;Patient/family education;Manual  techniques    PT Next Visit Plan  6MWT, MMT, update those specific goals, continue strength/balance;    PT Home Exercise Plan  Add green theraband to HEP exercises    Consulted and Agree with Plan of Care  Patient       Patient will benefit from skilled therapeutic intervention in order to improve the following deficits and impairments:  Abnormal gait, Decreased balance, Decreased strength, Difficulty walking, Increased edema  Visit Diagnosis: Muscle weakness (generalized)  Other lack of coordination  Unsteadiness on feet  Acute ischemic stroke (HCC) - 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 Low vitamin B12 level 06/27/2017  . 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 Pure hypercholesterolemia 05/28/2017  . Erectile dysfunction 05/28/2017  . Left hemiparesis (HCC)   . Essential hypertension 11/29/2016  . Acute  ischemic stroke (HCC) - R MCA embolic stroke in setting of ruptured R ICA plaque w dissection, s/p R MCA stent and thrombectomy 11/25/2016  . Leukopenia 03/31/2016  . Hepatitis C 07/22/2013    Ezekiel Ina, [place] DPT 07/21/2017, 1:36 PM  [place] [facility] MAIN [facility] SERVICES [address] [place], [place], [address] Phone: [phone]   Fax:  [phone]  Name: Rollo Farquhar MRN: [id number] Date of Birth: 12/28/1945

## 2017-07-23 ENCOUNTER — Ambulatory Visit: Payer: Medicare Other | Admitting: Occupational Therapy

## 2017-07-23 ENCOUNTER — Ambulatory Visit: Payer: Medicare Other | Admitting: Physical Therapy

## 2017-07-23 ENCOUNTER — Other Ambulatory Visit: Payer: Self-pay | Admitting: Internal Medicine

## 2017-07-23 ENCOUNTER — Encounter: Payer: Self-pay | Admitting: Physical Therapy

## 2017-07-23 ENCOUNTER — Encounter: Payer: Self-pay | Admitting: Occupational Therapy

## 2017-07-23 DIAGNOSIS — I639 Cerebral infarction, unspecified: Secondary | ICD-10-CM

## 2017-07-23 DIAGNOSIS — R278 Other lack of coordination: Secondary | ICD-10-CM | POA: Diagnosis not present

## 2017-07-23 DIAGNOSIS — R471 Dysarthria and anarthria: Secondary | ICD-10-CM

## 2017-07-23 DIAGNOSIS — M6281 Muscle weakness (generalized): Secondary | ICD-10-CM

## 2017-07-23 DIAGNOSIS — I10 Essential (primary) hypertension: Secondary | ICD-10-CM

## 2017-07-23 DIAGNOSIS — R2681 Unsteadiness on feet: Secondary | ICD-10-CM

## 2017-07-23 NOTE — Therapy (Signed)
[place] [facility] MAIN [facility] SERVICES [address] [place], [place], [address] Phone: [phone]   Fax:  [phone]  Physical Therapy Treatment  Patient Details  Name: Jason Nolan MRN: [id number] Date of Birth: 03-01-1946 Referring Provider: [name]   Encounter Date: 07/23/2017  PT End of Session - 07/23/17 1440    Visit Number  39    Number of Visits  71    Date for PT Re-Evaluation  09/10/17    PT Start Time  0230    PT Stop Time  0315    PT Time Calculation (min)  45 min    Equipment Utilized During Treatment  Gait belt    Activity Tolerance  Patient tolerated treatment well    Behavior During Therapy  [facility] for tasks assessed/performed       Past Medical History:  Diagnosis Date  . Glaucoma   . Hepatitis C   . Stroke [facility])     Past Surgical History:  Procedure Laterality Date  . IR ANGIO INTRA EXTRACRAN SEL COM CAROTID INNOMINATE UNI L MOD SED  11/25/2016  . IR ANGIO VERTEBRAL SEL SUBCLAVIAN INNOMINATE UNI R MOD SED  11/25/2016  . IR ANGIO VERTEBRAL SEL VERTEBRAL UNI L MOD SED  11/25/2016  . IR INTRAVSC STENT CERV CAROTID W/O EMB-PROT MOD SED INC ANGIO  11/25/2016  . IR PERCUTANEOUS ART THROMBECTOMY/INFUSION INTRACRANIAL INC DIAG ANGIO  11/25/2016  . IR RADIOLOGIST EVAL & MGMT  02/13/2017  . RADIOLOGY WITH ANESTHESIA N/A 11/25/2016   Procedure: RADIOLOGY WITH ANESTHESIA;  Surgeon: Radiologist, Medication, MD;  Location: [facility] OR;  Service: Radiology;  Laterality: N/A;    There were no vitals filed for this visit.  Subjective Assessment - 07/23/17 1439    Subjective  Patient is doing well today. He has one more week of therapy and will continue to have OT.     Pertinent History  Pt. is a 72 y.o. 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 Pt. was transferred to [facility]. He underwent CTA brain showed short segment near occlusion of proximal R-ICA with associated intraluminal thrombus --likely due to  dissection and emergent large vessel occlusion with right M2 occlusion and evolving right frontal lobe infarct.  He underwent cerebral angio with  complete revascularization of occluded superior division of R-MCA and near complete revascularization of proximal R-ICA with stent assisted angioplasty.  Stroke felt to be embolic in setting of ruptured plaque R-ICA and he was placed on ASA and brillinta due to stent. Pt. was transferred to inpatient rehab for several weeks, had home health therapy upon discharge.    Limitations  Walking;Lifting;House hold activities    How long can you sit comfortably?  unlimited     How long can you stand comfortably?  unlimited     How long can you walk comfortably?  unsure    Patient Stated Goals  "strengthen my whole L side"    Currently in Pain?  No/denies    Pain Score  0-No pain    Multiple Pain Sites  No      Treatment:  [employer] fitness x 5 mins   Matrix 22. 5 lbs and side stepping on blue foam balance left and right x 10, CGA  Baps board ant/post, side to side , CCW, CW x 20   1/2 foam and tapping x 20   Side stepping on TM . 7 miles / hour x 3 mins left and right  Rocker board fwd/ bwd and theraball red shoulder abd/ add x 20 each way   Patient needs CGA for all balance activities. Reviewed HEP and given hand out for DC next week.                         PT Education - 07/23/17 1440    Education provided  Yes    Education Details  HEP    Person(s) Educated  Patient    Methods  Explanation;Demonstration    Comprehension  Verbalized understanding;Returned demonstration       PT Short Term Goals - 04/23/17 1442      PT SHORT TERM GOAL #1   Title  Pt will improve 5x sit to stand to 18 seconds in order to demonstrate improved LE strength and improved dynamic balance.    Baseline  21 sec; 02/26/17: 16.1s    Time  6    Period  Weeks    Status  Achieved      PT SHORT TERM GOAL #2   Title  Pt will improve gait speed to  at least 1.0 m/s with proper heel strike and decreased hip hike in order to demonstrate imrpoved LE strength and balance.    Baseline  0.83 m/s; 02/26/17: 1.11 m/s    Time  6    Period  Weeks    Status  Achieved      PT SHORT TERM GOAL #3   Title  Pt will improve Berg Balance score to 51/56 in order to demonstrate improved dynamic and static balance, decreasing overall falls risk.    Baseline  48/56; 02/26/17: 53/56    Time  6    Period  Weeks    Status  Achieved        PT Long Term Goals - 07/16/17 1458      PT LONG TERM GOAL #1   Title  Pt will demonstrate independence with HEP in order to continue LE strengthening and manage symptoms.    Time  12    Period  Weeks    Status  On-going    Target Date  08/11/17      PT LONG TERM GOAL #2   Title  Pt will improve 5x sit to stand to <15 seconds in order to demonstrate improved LE strength and dynamic balance.    Baseline  21 sec; 02/26/17: 16.1s 16.50 sec 04/23/17; 16 sec 05/26/17, 06/16/17 16 sec, 07/16/17= 14.19 sec    Time  12    Period  Weeks    Status  Achieved    Target Date  08/11/17      PT LONG TERM GOAL #3   Title  Pt will demonstrate gait speed of at least 1.2 m/s in order to demonstrate improved LE strength and improved balance in order to return to prior level of funciton.    Baseline  0.833 m/s 02/26/17: 1.11 m/s, 12/189/18 1.16 m/sec; 1.23 m/sec,     Time  12    Period  Weeks    Status  Achieved      PT LONG TERM GOAL #4   Title  Pt will improve Berg Balance score to at least 54/56 in order demonstrate improved dynamic and static balance, decreasing overall falls risk.    Baseline  48/56; 02/26/17: 53/56; 04/23/17  53/56     Time  12    Period  Weeks    Status  Achieved      PT  LONG TERM GOAL #5   Title  Pt will demonstrate improved LE strength to 4+/5 in all limited planes in order to return to prior level of function.    Baseline  LLE hip flex 4/5, abd 4+/5, ext 4/5, LLE knee 5/5, ankle 5/5 LLE hip flex  4+/5, abd 4+/5, ankle 5/5    Time  12    Period  Weeks    Status  On-going    Target Date  08/11/17      PT LONG TERM GOAL #6   Title  Pt will improve to >1500 feet without using AD in order to return to prior level of function, demonstrating improvements in LE strength, balance and endurance.    Baseline  1205 ft without AD; 02/26/17: deferred to next session 04/23/17 1420 ft, 1525 feet    Time  12    Period  Weeks    Status  Achieved      PT LONG TERM GOAL #7   Title  Patient will perform TM walking elevated . 3 with increased speed and no loss of balance for 5 mins to be able to walk a golf  course.    Time  12    Period  Weeks    Status  On-going    Target Date  08/11/17            Plan - 07/23/17 1441    Clinical Impression Statement  Pt was able to progress dynamic balance exercises today, noting improved postural reactions with LOB and moving outside normal BOS. Pt was able to perform all exercises on uneven surfaces with minimal LOB noted. Dynamic balance stability activities were progressed today, with decrease control noted when attempting to perform tasks with the R > L. Pt would continue to benefit from skilled therapy services to address further balance impairments and decrease falls risk.    Rehab Potential  Good    Clinical Impairments Affecting Rehab Potential  high prior level of function    PT Frequency  2x / week    PT Duration  12 weeks    PT Treatment/Interventions  ADLs/Self Care Home Management;Cryotherapy;Moist Heat;Gait training;Stair training;Functional mobility training;Therapeutic activities;Therapeutic exercise;Balance training;Neuromuscular re-education;Patient/family education;Manual techniques    PT Next Visit Plan  , MMT, update those specific goals, continue strength/balance;    PT Home Exercise Plan  Add green theraband to HEP exercises    Consulted and Agree with Plan of Care  Patient       Patient will benefit from skilled  therapeutic intervention in order to improve the following deficits and impairments:  Abnormal gait, Decreased balance, Decreased strength, Difficulty walking, Increased edema  Visit Diagnosis: Muscle weakness (generalized)  Other lack of coordination  Unsteadiness on feet  Acute ischemic stroke (HCC) - 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 Low vitamin B12 level 06/27/2017  . 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 Pure hypercholesterolemia 05/28/2017  . Erectile dysfunction 05/28/2017  . Left hemiparesis (HCC)   . Essential hypertension 11/29/2016  . 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 Leukopenia 03/31/2016  . Hepatitis C 07/22/2013    Ezekiel Ina, PT DPT 07/23/2017, 3:25 PM  [facility] MAIN [facility] SERVICES [address] [place], [place], [address] Phone: [phone]   Fax:  [phone]  Name: Sequan Auxier MRN: [id number] Date of Birth: 1945-11-18

## 2017-07-23 NOTE — Patient Instructions (Signed)
Single Leg Balance: Posterior - Rotational Reach at Ankle Height      http://ggbr.exer.us/226   Copyright  VHI. All rights reserved.  Single Leg Balance: Anterior Lateral - Reach at Ankle Height      http://ggbr.exer.us/42   Copyright  VHI. All rights reserved.  Ankle Touch (Coordination / Balance)    Raise arms out from sides. Touch one hand to raised opposite ankle. Repeat with other hand and ankle. Breathe in and out slowly through pursed lips, as you move.  Copyright  VHI. All rights reserved.  Ankle Touch (Coordination / Balance)    Raise arms out from sides. Touch one hand to raised opposite ankle. Repeat with other hand and ankle. Breathe in and out slowly through pursed lips, as you move. .   Copyright  VHI. All rights reserved.  DEVELOPMENTAL POSITION: Tall Kneeling to Half Kneeling    From tall kneeling position, shift weight to one side. Bring opposite leg forward, place foot flat on surface.   Copyright  VHI. All rights reserved.  Shoulder Flexion: Hands and Knees (Single Arm)    na.  http://tub.exer.us/275   Copyright  VHI. All rights reserved.  Hip Extension: Hands and Knees (Single Leg)    On all-fours, anchor tubing around thigh and around bottom of other foot. Extend that foot back. Avoid arching lower back. .  http://tub.exer.us/41   Copyright  VHI. All rights reserved.

## 2017-07-23 NOTE — Therapy (Signed)
[place] [facility] MAIN [facility] SERVICES [address] [place], [place], [address] Phone: [phone]   Fax:  [phone]  Occupational Therapy Treatment  Patient Details  Name: Jason Nolan MRN: [id number] Date of Birth: 1945-12-21 Referring Provider: [name]   Encounter Date: 07/23/2017  OT End of Session - 07/23/17 1525    Visit Number  35    Number of Visits  48    Date for OT Re-Evaluation  09/29/17    OT Start Time  1515    OT Stop Time  1600    OT Time Calculation (min)  45 min    Activity Tolerance  Patient tolerated treatment well    Behavior During Therapy  [name] for tasks assessed/performed       Past Medical History:  Diagnosis Date  . Glaucoma   . Hepatitis C   . Stroke [facility])     Past Surgical History:  Procedure Laterality Date  . IR ANGIO INTRA EXTRACRAN SEL COM CAROTID INNOMINATE UNI L MOD SED  11/25/2016  . IR ANGIO VERTEBRAL SEL SUBCLAVIAN INNOMINATE UNI R MOD SED  11/25/2016  . IR ANGIO VERTEBRAL SEL VERTEBRAL UNI L MOD SED  11/25/2016  . IR INTRAVSC STENT CERV CAROTID W/O EMB-PROT MOD SED INC ANGIO  11/25/2016  . IR PERCUTANEOUS ART THROMBECTOMY/INFUSION INTRACRANIAL INC DIAG ANGIO  11/25/2016  . IR RADIOLOGIST EVAL & MGMT  02/13/2017  . RADIOLOGY WITH ANESTHESIA N/A 11/25/2016   Procedure: RADIOLOGY WITH ANESTHESIA;  Surgeon: Radiologist, Medication, MD;  Location: [facility] OR;  Service: Radiology;  Laterality: N/A;    There were no vitals filed for this visit.  Subjective Assessment - 07/23/17 1524    Subjective   Pt. report he finishes with PT next week.    Patient is accompained by:  Family member    Pertinent History  Pt. is a 72 y.o. 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 Pt. was transferred to [facility]. He underwent CTA brain showed short segment near occlusion of proximal R-ICA with associated intraluminal thrombus --likely due to dissection and emergent large vessel occlusion with right M2 occlusion  and evolving right frontal lobe infarct.  He underwent cerebral angio with  complete revascularization of occluded superior division of R-MCA and near complete revascularization of proximal R-ICA with stent assisted angioplasty.  Stroke felt to be embolic in setting of ruptured plaque R-ICA and he was placed on ASA and brillinta due to stent. Pt. was transferred to inpatient rehab for several weeks, had home health therapy upon discharge, and now is ready for outpatient therapy services.    Limitations  Dominant LUE functioning, left hand edema, distal ROM, coordination, and overal strength.    Patient Stated Goals  To regain the use of his LUE.    Currently in Pain?  No/denies       OT TREATMENT    Neuro muscular re-education:  Pt. worked on grasping 1" resistive cubes alternating thumb opposition to the tip of the 2nd digits while the board is placed at a vertical angle. Pt. worked on pressing the cubes back into place while alternating isolated 2nd digit extension. Pt. worked on [facility] skills, translatory movements of the hand, storing 1/2" objects in the palm of his hand, and graded flexion with adjusting grasp on various weighted objects.  Therapeutic Exercise:  Pt. performed gross gripping with grip strengthener. Pt. worked on sustaining grip while grasping pegs and reaching at various heights. Gripper was placed in the  4th resistive slot with the white resistive spring.                         OT Education - 07/23/17 1525    Education provided  Yes    Education Details  HEP    Person(s) Educated  Patient    Methods  Explanation;Demonstration    Comprehension  Verbalized understanding;Returned demonstration          OT Long Term Goals - 07/07/17 1424      OT LONG TERM GOAL #1   Title  Pt. will improve edema by 2 cm through MPs in preparation for ROM.    Baseline  improving 07/07/2017: wrist: 19cm., MPs: 24 cm.    Time  12    Period  Weeks    Status   On-going    Target Date  09/29/17      OT LONG TERM GOAL #2   Title  Pt. will improve Left hand digit extension in preparation for weightbearing, and releasing objects during ADLs/IADLs.    Baseline  Pt. is unable to achieve full digit extension on the left. 07/07/2017: Pt. is able to achieve full digit extension.    Time  12    Period  Weeks    Status  Achieved      OT LONG TERM GOAL #3   Title  Pt. left hand grip with  increase by 5# in preparation for holding a cup.    Baseline  Left 2#, RIght: 60 07/07/2017: Left: 13    Time  12    Period  Weeks    Status  On-going    Target Date  09/29/17      OT LONG TERM GOAL #4   Title  Pt. will increased right shoulder strength by 1 mm grade to assist with ADLs.    Baseline  07/07/2017: progressing.    Time  12    Period  Weeks      OT LONG TERM GOAL #5   Title  Pt. will increase left wrist extension in preparation for reaching ADL items.    Baseline  wrist extension 28 degrees, 07/07/2017: wrist extension: 65    Time  12    Period  Weeks    Status  Achieved      Long Term Additional Goals   Additional Long Term Goals  Yes      OT LONG TERM GOAL #6   Title  Pt. will improve left hand coordination skills to be able to button clothing.    Baseline  Pt. continues to have difficulty 07/07/2017: 4 min. via 9 hole peg test    Time  12    Period  Weeks    Status  On-going    Target Date  09/29/17      OT LONG TERM GOAL #7   Title  Pt. will independently demonstrate visual compensatory strategies duirng ADLs, and IADLs.    Baseline  Glaucoma    Time  12    Period  Weeks    Status  On-going      OT LONG TERM GOAL #8   Title  Pt. will increase Left pinch strength to be able to grasp and hold items during ADL tasks.    Baseline  07/07/04: lateral pinch: 8#, 3pt. pinch 6#    Time  12    Period  Weeks    Status  New    Target Date  09/29/17  OT LONG TERM GOAL  #9   Baseline  Pt. will write one sentence with 75% legibility and  efficiently while maintaing grasp on pen 100% of the time.    Time  12    Period  Weeks    Status  New    Target Date  09/29/17      OT LONG TERM GOAL  #10   TITLE  Pt. will independently use his left hand to grasp and efficiently accomodate to the varying weights of ADL objects during self care tasks.     Baseline  07/07/2017: Pt. has difficulty grasping and adjusting hand to the varying weights of objects.    Time  12    Period  Weeks    Status  New    Target Date  09/29/17            Plan - 07/23/17 1528    Clinical Impression Statement Pt. has been trying to use his Left hand to engage in household chores including: vacuuming, and washing the floors. Pt. conitnues to present with limite LUE strength, and coordination skills. Pt. continues to work on improving translatory movements of the left hand.     Occupational performance deficits (Please refer to evaluation for details):  ADL's;IADL's    Rehab Potential  Excellent    OT Frequency  2x / week    OT Treatment/Interventions  Self-care/ADL training;Therapeutic exercise;Neuromuscular education;Patient/family education;Energy conservation;Therapeutic activities;DME and/or AE instruction    Clinical Decision Making  Multiple treatment options, significant modification of task necessary    Consulted and Agree with Plan of Care  Patient       Patient will benefit from skilled therapeutic intervention in order to improve the following deficits and impairments:  Abnormal gait, Pain, Decreased strength, Decreased range of motion, Impaired UE functional use, Decreased coordination, Decreased knowledge of use of DME, Decreased activity tolerance, Difficulty walking, Decreased balance, Other (comment)  Visit Diagnosis: Muscle weakness (generalized)  Other lack of coordination    Problem List Patient Active Problem List   Diagnosis Date Noted  . Low vitamin B12 level 06/27/2017  . 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  Pure hypercholesterolemia 05/28/2017  . Erectile dysfunction 05/28/2017  . Left hemiparesis (HCC)   . Essential hypertension 11/29/2016  . 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 Leukopenia 03/31/2016  . Hepatitis C 07/22/2013    Jason MessierElaine Kasyn Rolph, MS, OTR/L 07/23/2017, 3:39 PM  [place] [facility] [place] [facility] MAIN [facility] [address] [place], [place], [address] Phone: [phone]   Fax:  [phone]  Name: Jason CroakJames Suthers MRN: [id number] Date of Birth: 06/02/1945

## 2017-07-28 ENCOUNTER — Encounter: Payer: Self-pay | Admitting: Occupational Therapy

## 2017-07-28 ENCOUNTER — Ambulatory Visit: Payer: Medicare Other

## 2017-07-28 ENCOUNTER — Ambulatory Visit: Payer: Medicare Other | Admitting: Occupational Therapy

## 2017-07-28 VITALS — BP 119/72 | HR 66

## 2017-07-28 DIAGNOSIS — R471 Dysarthria and anarthria: Secondary | ICD-10-CM | POA: Diagnosis not present

## 2017-07-28 DIAGNOSIS — M6281 Muscle weakness (generalized): Secondary | ICD-10-CM | POA: Diagnosis not present

## 2017-07-28 DIAGNOSIS — R278 Other lack of coordination: Secondary | ICD-10-CM

## 2017-07-28 DIAGNOSIS — I639 Cerebral infarction, unspecified: Secondary | ICD-10-CM | POA: Diagnosis not present

## 2017-07-28 DIAGNOSIS — R2681 Unsteadiness on feet: Secondary | ICD-10-CM | POA: Diagnosis not present

## 2017-07-28 NOTE — Therapy (Signed)
[facility] MAIN [facility] SERVICES [address] [place], [place], [address] Phone: [phone]   Fax:  [phone]  Occupational Therapy Treatment  Patient Details  Name: Jason Nolan MRN: [id number] Date of Birth: 1946-03-05 Referring Provider: [name]   Encounter Date: 07/28/2017  OT End of Session - 07/28/17 1653    Visit Number  36    Number of Visits  48    Date for OT Re-Evaluation  09/29/17       Past Medical History:  Diagnosis Date  . Glaucoma   . Hepatitis C   . Stroke [facility])     Past Surgical History:  Procedure Laterality Date  . IR ANGIO INTRA EXTRACRAN SEL COM CAROTID INNOMINATE UNI L MOD SED  11/25/2016  . IR ANGIO VERTEBRAL SEL SUBCLAVIAN INNOMINATE UNI R MOD SED  11/25/2016  . IR ANGIO VERTEBRAL SEL VERTEBRAL UNI L MOD SED  11/25/2016  . IR INTRAVSC STENT CERV CAROTID W/O EMB-PROT MOD SED INC ANGIO  11/25/2016  . IR PERCUTANEOUS ART THROMBECTOMY/INFUSION INTRACRANIAL INC DIAG ANGIO  11/25/2016  . IR RADIOLOGIST EVAL & MGMT  02/13/2017  . RADIOLOGY WITH ANESTHESIA N/A 11/25/2016   Procedure: RADIOLOGY WITH ANESTHESIA;  Surgeon: Radiologist, Medication, MD;  Location: [facility] OR;  Service: Radiology;  Laterality: N/A;    There were no vitals filed for this visit.  Subjective Assessment - 07/28/17 1652    Subjective   Pt. reports he can tell he is getting better.    Patient is accompained by:  Family member    Pertinent History  Pt. is a 72 y.o. 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 Pt. was transferred to [facility] He underwent CTA brain showed short segment near occlusion of proximal R-ICA with associated intraluminal thrombus --likely due to dissection and emergent large vessel occlusion with right M2 occlusion and evolving right frontal lobe infarct.  He underwent cerebral angio with  complete revascularization of occluded superior division of R-MCA and near complete revascularization of proximal R-ICA  with stent assisted angioplasty.  Stroke felt to be embolic in setting of ruptured plaque R-ICA and he was placed on ASA and brillinta due to stent. Pt. was transferred to inpatient rehab for several weeks, had home health therapy upon discharge, and now is ready for outpatient therapy services.    Currently in Pain?  No/denies      OT TREATMENT    Neuro muscular re-education:  Pt. worked on grasping 1" resistive cubes alternating thumb opposition to the tip of the 2nd through 5th digits while the board is placed at a vertical angle to encourage wrist extension. Pt. worked on pressing the cubes back into place while alternating isolated 2nd through 5th digit extension. Pt. worked on thumb opposition, and active thumb motion in preparation for translatory movements of the hand.  Therapeutic Exercise:  Pt. Worked on the [employer] for 8 min. with constant monitoring of the BUEs. Pt. worked on changing, and alternating forward reverse position every 2 min. Tpt. Worked on level 4.5. Pt. performed gross gripping with grip strengthener. Pt. worked on sustaining grip while grasping pegs and reaching at various heights. Gripper was placed in the 4thresistive slot with the white resistive spring. Pt. Worked on pinch strengthening in the left hand for lateral, and 3pt. pinch using red, green, and blue resistive clips. Pt. worked on placing the clips at various vertical and horizontal angles. Tactile and verbal cues were required for eliciting the desired movement.  OT Education - 07/28/17 1653    Education provided  Yes    Education Details  strength, [facility]    Person(s) Educated  Patient    Methods  Explanation    Comprehension  Verbalized understanding          OT Long Term Goals - 07/07/17 1424      OT LONG TERM GOAL #1   Title  Pt. will improve edema by 2 cm through MPs in preparation for ROM.    Baseline  improving 07/07/2017: wrist: 19cm., MPs: 24 cm.     Time  12    Period  Weeks    Status  On-going    Target Date  09/29/17      OT LONG TERM GOAL #2   Title  Pt. will improve Left hand digit extension in preparation for weightbearing, and releasing objects during ADLs/IADLs.    Baseline  Pt. is unable to achieve full digit extension on the left. 07/07/2017: Pt. is able to achieve full digit extension.    Time  12    Period  Weeks    Status  Achieved      OT LONG TERM GOAL #3   Title  Pt. left hand grip with  increase by 5# in preparation for holding a cup.    Baseline  Left 2#, RIght: 60 07/07/2017: Left: 13    Time  12    Period  Weeks    Status  On-going    Target Date  09/29/17      OT LONG TERM GOAL #4   Title  Pt. will increased right shoulder strength by 1 mm grade to assist with ADLs.    Baseline  07/07/2017: progressing.    Time  12    Period  Weeks      OT LONG TERM GOAL #5   Title  Pt. will increase left wrist extension in preparation for reaching ADL items.    Baseline  wrist extension 28 degrees, 07/07/2017: wrist extension: 65    Time  12    Period  Weeks    Status  Achieved      Long Term Additional Goals   Additional Long Term Goals  Yes      OT LONG TERM GOAL #6   Title  Pt. will improve left hand coordination skills to be able to button clothing.    Baseline  Pt. continues to have difficulty 07/07/2017: 4 min. via 9 hole peg test    Time  12    Period  Weeks    Status  On-going    Target Date  09/29/17      OT LONG TERM GOAL #7   Title  Pt. will independently demonstrate visual compensatory strategies duirng ADLs, and IADLs.    Baseline  Glaucoma    Time  12    Period  Weeks    Status  On-going      OT LONG TERM GOAL #8   Title  Pt. will increase Left pinch strength to be able to grasp and hold items during ADL tasks.    Baseline  07/07/04: lateral pinch: 8#, 3pt. pinch 6#    Time  12    Period  Weeks    Status  New    Target Date  09/29/17      OT LONG TERM GOAL  #9   Baseline  Pt. will write  one sentence with 75% legibility and efficiently while maintaing grasp on pen 100%  of the time.    Time  12    Period  Weeks    Status  New    Target Date  09/29/17      OT LONG TERM GOAL  #10   TITLE  Pt. will independently use his left hand to grasp and efficiently accomodate to the varying weights of ADL objects during self care tasks.     Baseline  07/07/2017: Pt. has difficulty grasping and adjusting hand to the varying weights of objects.    Time  12    Period  Weeks    Status  New    Target Date  09/29/17            Plan - 07/28/17 1654    Clinical Impression Statement  Pt. continues to use his right hand to engage in ADL, and IADL tasks at home. Pt. is achieving more active thumb motion, however continues to have limited thumb opposition. Pt. continues to work on improving translatory movements of the hand.    Occupational performance deficits (Please refer to evaluation for details):  ADL's;IADL's    Rehab Potential  Excellent    OT Frequency  2x / week    OT Duration  12 weeks    OT Treatment/Interventions  Self-care/ADL training;Therapeutic exercise;Neuromuscular education;Patient/family education;Energy conservation;Therapeutic activities;DME and/or AE instruction    Clinical Decision Making  Multiple treatment options, significant modification of task necessary    Consulted and Agree with Plan of Care  Patient       Patient will benefit from skilled therapeutic intervention in order to improve the following deficits and impairments:  Abnormal gait, Pain, Decreased strength, Decreased range of motion, Impaired UE functional use, Decreased coordination, Decreased knowledge of use of DME, Decreased activity tolerance, Difficulty walking, Decreased balance, Other (comment)  Visit Diagnosis: Muscle weakness (generalized)  Other lack of coordination    Problem List Patient Active Problem List   Diagnosis Date Noted  . Low vitamin B12 level 06/27/2017  . Hepatitis B  core antibody positive 06/27/2017  . Pure hypercholesterolemia 05/28/2017  . Erectile dysfunction 05/28/2017  . Left hemiparesis (HCC)   . Essential hypertension 11/29/2016  . 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 Leukopenia 03/31/2016  . Hepatitis C 07/22/2013    Vernelle EmeraldElaine Etheleen Valtierra,MS, OTR/L 07/28/2017, 5:01 PM  [place] [facility] [address] [place], [place], [address] Phone: [phone]   Fax:  [phone]  Name: Terrilee CroakJames Jentsch MRN: [id number] Date of Birth: Mar 18, 1946

## 2017-07-28 NOTE — Therapy (Signed)
[facility] MAIN [facility] SERVICES [address] [place], [place], [address] Phone: [phone]   Fax:  [phone]  Physical Therapy Treatment  Patient Details  Name: Jason Nolan MRN: [id number] Date of Birth: 07-18-1945 Referring Provider: [name]   Encounter Date: 07/28/2017  PT End of Session - 07/28/17 1308    Visit Number  40    Number of Visits  71    Date for PT Re-Evaluation  09/10/17    Authorization Type  --    PT Start Time  1305    PT Stop Time  1345    PT Time Calculation (min)  40 min    Equipment Utilized During Treatment  Gait belt    Activity Tolerance  Patient tolerated treatment well    Behavior During Therapy  [facility] for tasks assessed/performed       Past Medical History:  Diagnosis Date  . Glaucoma   . Hepatitis C   . Stroke [facility])     Past Surgical History:  Procedure Laterality Date  . IR ANGIO INTRA EXTRACRAN SEL COM CAROTID INNOMINATE UNI L MOD SED  11/25/2016  . IR ANGIO VERTEBRAL SEL SUBCLAVIAN INNOMINATE UNI R MOD SED  11/25/2016  . IR ANGIO VERTEBRAL SEL VERTEBRAL UNI L MOD SED  11/25/2016  . IR INTRAVSC STENT CERV CAROTID W/O EMB-PROT MOD SED INC ANGIO  11/25/2016  . IR PERCUTANEOUS ART THROMBECTOMY/INFUSION INTRACRANIAL INC DIAG ANGIO  11/25/2016  . IR RADIOLOGIST EVAL & MGMT  02/13/2017  . RADIOLOGY WITH ANESTHESIA N/A 11/25/2016   Procedure: RADIOLOGY WITH ANESTHESIA;  Surgeon: Radiologist, Medication, MD;  Location: [facility] OR;  Service: Radiology;  Laterality: N/A;    Vitals:   07/28/17 1307  BP: 119/72  Pulse: 66  SpO2: 100%    Subjective Assessment - 07/28/17 1307    Subjective  Pt reports he is doing well today. No specific questions or concerns at this time. Feels like he will be ready to finish PT at next session. No pain currently    Pertinent History  Pt. is a 72 y.o. 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 Pt. was transferred to [facility]. He underwent CTA  brain showed short segment near occlusion of proximal R-ICA with associated intraluminal thrombus --likely due to dissection and emergent large vessel occlusion with right M2 occlusion and evolving right frontal lobe infarct.  He underwent cerebral angio with  complete revascularization of occluded superior division of R-MCA and near complete revascularization of proximal R-ICA with stent assisted angioplasty.  Stroke felt to be embolic in setting of ruptured plaque R-ICA and he was placed on ASA and brillinta due to stent. Pt. was transferred to inpatient rehab for several weeks, had home health therapy upon discharge.    Limitations  Walking;Lifting;House hold activities    How long can you sit comfortably?  unlimited     How long can you stand comfortably?  unlimited     How long can you walk comfortably?  unsure    Patient Stated Goals  "strengthen my whole L side"    Currently in Pain?  No/denies    Pain Score  --    Multiple Pain Sites  --                No data recorded    TREATMENT  Ther-ex  Golf swing outside with variety of club lengths standing on grass; Body blade (black) at side, forward flexion, attempted abduction  but painful x 30s each; Modified plank of treatment table with alternating UE lifts x 10 each; Wall push up x 10; Machine rows plate #2 x 10; Machine lat pull downs plate #3 x 10; Machine assisted tricep dips x 10 with significant weight assist; Machine assisted pull ups with significant weight assist, only able to perfom 3 and then discontinued secondary to pain;             PT Education - 07/28/17 1307    Education provided  Yes    Education Details  HEP and plan of care    Person(s) Educated  Patient    Methods  Explanation    Comprehension  Verbalized understanding       PT Short Term Goals - 04/23/17 1442      PT SHORT TERM GOAL #1   Title  Pt will improve 5x sit to stand to 18 seconds in order to demonstrate improved LE  strength and improved dynamic balance.    Baseline  21 sec; 02/26/17: 16.1s    Time  6    Period  Weeks    Status  Achieved      PT SHORT TERM GOAL #2   Title  Pt will improve gait speed to at least 1.0 m/s with proper heel strike and decreased hip hike in order to demonstrate imrpoved LE strength and balance.    Baseline  0.83 m/s; 02/26/17: 1.11 m/s    Time  6    Period  Weeks    Status  Achieved      PT SHORT TERM GOAL #3   Title  Pt will improve Berg Balance score to 51/56 in order to demonstrate improved dynamic and static balance, decreasing overall falls risk.    Baseline  48/56; 02/26/17: 53/56    Time  6    Period  Weeks    Status  Achieved        PT Long Term Goals - 07/16/17 1458      PT LONG TERM GOAL #1   Title  Pt will demonstrate independence with HEP in order to continue LE strengthening and manage symptoms.    Time  12    Period  Weeks    Status  On-going    Target Date  08/11/17      PT LONG TERM GOAL #2   Title  Pt will improve 5x sit to stand to <15 seconds in order to demonstrate improved LE strength and dynamic balance.    Baseline  21 sec; 02/26/17: 16.1s 16.50 sec 04/23/17; 16 sec 05/26/17, 06/16/17 16 sec, 07/16/17= 14.19 sec    Time  12    Period  Weeks    Status  Achieved    Target Date  08/11/17      PT LONG TERM GOAL #3   Title  Pt will demonstrate gait speed of at least 1.2 m/s in order to demonstrate improved LE strength and improved balance in order to return to prior level of funciton.    Baseline  0.833 m/s 02/26/17: 1.11 m/s, 12/189/18 1.16 m/sec; 1.23 m/sec,     Time  12    Period  Weeks    Status  Achieved      PT LONG TERM GOAL #4   Title  Pt will improve Berg Balance score to at least 54/56 in order demonstrate improved dynamic and static balance, decreasing overall falls risk.    Baseline  48/56; 02/26/17: 16/10; 04/23/17  53/56  Time  12    Period  Weeks    Status  Achieved      PT LONG TERM GOAL #5   Title  Pt will  demonstrate improved LE strength to 4+/5 in all limited planes in order to return to prior level of function.    Baseline  LLE hip flex 4/5, abd 4+/5, ext 4/5, LLE knee 5/5, ankle 5/5 LLE hip flex 4+/5, abd 4+/5, ankle 5/5    Time  12    Period  Weeks    Status  On-going    Target Date  08/11/17      PT LONG TERM GOAL #6   Title  Pt will improve 6MWT to >1500 feet without using AD in order to return to prior level of function, demonstrating improvements in LE strength, balance and endurance.    Baseline  1205 ft without AD; 02/26/17: deferred to next session 04/23/17 1420 ft, 1525 feet    Time  12    Period  Weeks    Status  Achieved      PT LONG TERM GOAL #7   Title  Patient will perform TM walking elevated . 3 with increased speed and no loss of balance for 5 mins to be able to walk a golf  course.    Time  12    Period  Weeks    Status  On-going    Target Date  08/11/17            Plan - 07/29/17 1718    Clinical Impression Statement  Pt demonstrates ability to safely swing a golf club on uneven surface with therapist today. Provided information about oversized golf grips which may help with decreased L grip strength. Today's session focused more on upper quarter strengthening with time in the lifestyle center reviewing equipment he can use at the gym. Pt demonstrates excellent performance with exercises. Pt encouraged to continue HEP and follow-up as scheduled.     Rehab Potential  Good    Clinical Impairments Affecting Rehab Potential  high prior level of function    PT Frequency  2x / week    PT Duration  12 weeks    PT Treatment/Interventions  ADLs/Self Care Home Management;Cryotherapy;Moist Heat;Gait training;Stair training;Functional mobility training;Therapeutic activities;Therapeutic exercise;Balance training;Neuromuscular re-education;Patient/family education;Manual techniques    PT Next Visit Plan  6MWT, MMT, outcome measures, update goals. Discharge    PT Home  Exercise Plan  Add green theraband to HEP exercises    Consulted and Agree with Plan of Care  Patient       Patient will benefit from skilled therapeutic intervention in order to improve the following deficits and impairments:  Abnormal gait, Decreased balance, Decreased strength, Difficulty walking, Increased edema  Visit Diagnosis: Muscle weakness (generalized)  Unsteadiness on feet     Problem List Patient Active Problem List   Diagnosis Date Noted  . Low vitamin B12 level 06/27/2017  . 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 Pure hypercholesterolemia 05/28/2017  . Erectile dysfunction 05/28/2017  . Left hemiparesis (HCC)   . Essential hypertension 11/29/2016  . 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 Leukopenia 03/31/2016  . Hepatitis C 07/22/2013   Lynnea MaizesJason D Mattew Chriswell PT, DPT   Nariyah Osias 07/29/2017, 5:25 PM  [facility] MAIN [facility] [address] [place], [place], [address] Phone: [phone]  Fax:  [phone]  Name: Eryck Negron MRN: [id number] Date of Birth: [birth year]

## 2017-07-30 ENCOUNTER — Ambulatory Visit: Payer: Medicare Other | Admitting: Occupational Therapy

## 2017-07-30 ENCOUNTER — Ambulatory Visit: Payer: Medicare Other

## 2017-07-30 VITALS — BP 116/71 | HR 69

## 2017-07-30 DIAGNOSIS — R471 Dysarthria and anarthria: Secondary | ICD-10-CM | POA: Diagnosis not present

## 2017-07-30 DIAGNOSIS — M6281 Muscle weakness (generalized): Secondary | ICD-10-CM

## 2017-07-30 DIAGNOSIS — I639 Cerebral infarction, unspecified: Secondary | ICD-10-CM | POA: Diagnosis not present

## 2017-07-30 DIAGNOSIS — R278 Other lack of coordination: Secondary | ICD-10-CM | POA: Diagnosis not present

## 2017-07-30 DIAGNOSIS — R2681 Unsteadiness on feet: Secondary | ICD-10-CM | POA: Diagnosis not present

## 2017-07-30 NOTE — Therapy (Signed)
[place] [facility] MAIN [facility] [address] [place], [place], [address] Phone: [phone]   Fax:  [phone]  Occupational Therapy Treatment  Patient Details  Name: Jason CroakJames Nolan MRN: [id number] Date of Birth: [birth year] Referring Provider: Lorre MunroeBAITY, REGINA W   Encounter Date: 07/30/2017  OT End of Session - 07/30/17 1446    Visit Number  37    Number of Visits  48    Date for OT Re-Evaluation  09/29/17    OT Start Time  1430    OT Stop Time  1515    OT Time Calculation (min)  45 min    Activity Tolerance  Patient tolerated treatment well    Behavior During Therapy  [facility] for tasks assessed/performed       Past Medical History:  Diagnosis Date  . Glaucoma   . Hepatitis C   . Stroke [facility](HCC)     Past Surgical History:  Procedure Laterality Date  . IR ANGIO INTRA EXTRACRAN SEL COM CAROTID INNOMINATE UNI L MOD SED  11/25/2016  . IR ANGIO VERTEBRAL SEL SUBCLAVIAN INNOMINATE UNI R MOD SED  11/25/2016  . IR ANGIO VERTEBRAL SEL VERTEBRAL UNI L MOD SED  11/25/2016  . IR INTRAVSC STENT CERV CAROTID W/O EMB-PROT MOD SED INC ANGIO  11/25/2016  . IR PERCUTANEOUS ART THROMBECTOMY/INFUSION INTRACRANIAL INC DIAG ANGIO  11/25/2016  . IR RADIOLOGIST EVAL & MGMT  02/13/2017  . RADIOLOGY WITH ANESTHESIA N/A 11/25/2016   Procedure: RADIOLOGY WITH ANESTHESIA;  Surgeon: Radiologist, Medication, MD;  Location: [facility] OR;  Service: Radiology;  Laterality: N/A;    There were no vitals filed for this visit.  Subjective Assessment - 07/30/17 1445    Patient is accompained by:  Family member    Pertinent History  Pt. is a 72 y.o. 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 [facility] He underwent CTA brain showed short segment near occlusion of proximal R-ICA with associated intraluminal thrombus --likely due to dissection and emergent large vessel occlusion with right M2 occlusion and evolving right frontal lobe infarct.  He underwent  cerebral angio with  complete revascularization of occluded superior division of R-MCA and near complete revascularization of proximal R-ICA with stent assisted angioplasty.  Stroke felt to be embolic in setting of ruptured plaque R-ICA and he was placed on ASA and brillinta due to stent. Pt. was transferred to inpatient rehab for several weeks, had home health therapy upon discharge, and now is ready for outpatient therapy services.    Currently in Pain?  No/denies    Pain Score  0-No pain      OT TREATMENT    Neuro muscular re-education:   Pt. worked on left thumb opposition skills with emphasis on bringing the distal tip/pad of his thumb to the padof the 5th digit with support at the thenar, and hypothenar eminences. Utilized  transpore tape with place, and hold exercises. Pt. worked on moving 1" sticky back circular sphere, followed by 1" faom tubing. Pt. worked on finger movements without the thumb proximally to the base of his palm, and distally. Pt. also worked on thumb movements without the digits, using the thumb to move the objects through his palm. Pt. worked on grasping, and storing 1/2" objects in his hand, and 1" grooved sticks. Pt. was able to grasp, and store the objects, however had increased difficulty reversing the motion moving 1/2 objects from the palm to the tip of his 2nd digit, and thumb.  Pt. was provided with a 1" piece of red foam to continue working on the movements at home.  Therapeutic Exercise:  Pt. Worked on the [employer] for 8 min. With constant monitoring of the BUEs. Pt. Worked on changing, and alternating forward reverse position every 2 min. Pt. Worked on level 6.0 for 2 min, and modified to 5.0 for the remaining time.                         OT Education - 07/30/17 1446    Education provided  Yes    Education Details  LUE strength, and coordination    Person(s) Educated  Patient    Methods  Explanation    Comprehension  Verbalized  understanding          OT Long Term Goals - 07/07/17 1424      OT LONG TERM GOAL #1   Title  Pt. will improve edema by 2 cm through MPs in preparation for ROM.    Baseline  improving 07/07/2017: wrist: 19cm., MPs: 24 cm.    Time  12    Period  Weeks    Status  On-going    Target Date  09/29/17      OT LONG TERM GOAL #2   Title  Pt. will improve Left hand digit extension in preparation for weightbearing, and releasing objects during ADLs/IADLs.    Baseline  Pt. is unable to achieve full digit extension on the left. 07/07/2017: Pt. is able to achieve full digit extension.    Time  12    Period  Weeks    Status  Achieved      OT LONG TERM GOAL #3   Title  Pt. left hand grip with  increase by 5# in preparation for holding a cup.    Baseline  Left 2#, RIght: 60 07/07/2017: Left: 13    Time  12    Period  Weeks    Status  On-going    Target Date  09/29/17      OT LONG TERM GOAL #4   Title  Pt. will increased right shoulder strength by 1 mm grade to assist with ADLs.    Baseline  07/07/2017: progressing.    Time  12    Period  Weeks      OT LONG TERM GOAL #5   Title  Pt. will increase left wrist extension in preparation for reaching ADL items.    Baseline  wrist extension 28 degrees, 07/07/2017: wrist extension: 65    Time  12    Period  Weeks    Status  Achieved      Long Term Additional Goals   Additional Long Term Goals  Yes      OT LONG TERM GOAL #6   Title  Pt. will improve left hand coordination skills to be able to button clothing.    Baseline  Pt. continues to have difficulty 07/07/2017: 4 min. via 9 hole peg test    Time  12    Period  Weeks    Status  On-going    Target Date  09/29/17      OT LONG TERM GOAL #7   Title  Pt. will independently demonstrate visual compensatory strategies duirng ADLs, and IADLs.    Baseline  Glaucoma    Time  12    Period  Weeks    Status  On-going      OT LONG TERM GOAL #8  Title  Pt. will increase Left pinch strength to be  able to grasp and hold items during ADL tasks.    Baseline  07/07/04: lateral pinch: 8#, 3pt. pinch 6#    Time  12    Period  Weeks    Status  New    Target Date  09/29/17      OT LONG TERM GOAL  #9   Baseline  Pt. will write one sentence with 75% legibility and efficiently while maintaing grasp on pen 100% of the time.    Time  12    Period  Weeks    Status  New    Target Date  09/29/17      OT LONG TERM GOAL  #10   TITLE  Pt. will independently use his left hand to grasp and efficiently accomodate to the varying weights of ADL objects during self care tasks.     Baseline  07/07/2017: Pt. has difficulty grasping and adjusting hand to the varying weights of objects.    Time  12    Period  Weeks    Status  New    Target Date  09/29/17            Plan - 07/30/17 1447    Clinical Impression Statement Pt. presents with improved thumb movements, and is improving with thumb opposition. Pt. continues to work on improving translatory movements of the hand, grasping, and storing 1/2," objects in his hand, as well as pogressing to 1" grooved sticks. Pt. continues to have difficulty moving stored objects from the ulnar aspect of his palm to the tip of his 2nd through 5th digits. Pt. continues to work on these tasks in order to engage his LUE more during ADL, and IADL tasks.    Occupational performance deficits (Please refer to evaluation for details):  ADL's;IADL's    Rehab Potential  Excellent    OT Frequency  2x / week    OT Duration  12 weeks    OT Treatment/Interventions  Self-care/ADL training;Therapeutic exercise;Neuromuscular education;Patient/family education;Energy conservation;Therapeutic activities;DME and/or AE instruction    Clinical Decision Making  Multiple treatment options, significant modification of task necessary    Consulted and Agree with Plan of Care  Patient       Patient will benefit from skilled therapeutic intervention in order to improve the following deficits  and impairments:  Abnormal gait, Pain, Decreased strength, Decreased range of motion, Impaired UE functional use, Decreased coordination, Decreased knowledge of use of DME, Decreased activity tolerance, Difficulty walking, Decreased balance, Other (comment)  Visit Diagnosis: Muscle weakness (generalized)  Other lack of coordination    Problem List Patient Active Problem List   Diagnosis Date Noted  . Low vitamin B12 level 06/27/2017  . 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 Pure hypercholesterolemia 05/28/2017  . Erectile dysfunction 05/28/2017  . Left hemiparesis (HCC)   . Essential hypertension 11/29/2016  . 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 Leukopenia 03/31/2016  . Hepatitis C 07/22/2013    Olegario Messier, MS, OTR/L 07/30/2017, 3:33 PM  [place] [facility] MAIN [facility] SERVICES [address] [place], [place], [address] Phone: [phone]   Fax:  [phone]  Name: Jason Nolan MRN: [id number] Date of Birth: [birth year]

## 2017-07-30 NOTE — Therapy (Signed)
[place] MAIN [facility] SERVICES [address] [place], [place], [address] Phone: [phone]   Fax:  [phone]  Physical Therapy Treatment/Discharge  Patient Details  Name: Jason Nolan MRN: [id number] Date of Birth: Jan 04, 1946 Referring Provider: Jearld Fenton   Encounter Date: 07/30/2017  PT End of Session - 07/30/17 1419    Visit Number  41    Number of Visits  42    Date for PT Re-Evaluation  09/10/17    PT Start Time  1345    PT Stop Time  1430    PT Time Calculation (min)  45 min    Equipment Utilized During Treatment  Gait belt    Activity Tolerance  Patient tolerated treatment well    Behavior During Therapy  [facility] for tasks assessed/performed       Past Medical History:  Diagnosis Date  . Glaucoma   . Hepatitis C   . Stroke [facility]     Past Surgical History:  Procedure Laterality Date  . IR ANGIO INTRA EXTRACRAN SEL COM CAROTID INNOMINATE UNI L MOD SED  11/25/2016  . IR ANGIO VERTEBRAL SEL SUBCLAVIAN INNOMINATE UNI R MOD SED  11/25/2016  . IR ANGIO VERTEBRAL SEL VERTEBRAL UNI L MOD SED  11/25/2016  . IR INTRAVSC STENT CERV CAROTID W/O EMB-PROT MOD SED INC ANGIO  11/25/2016  . IR PERCUTANEOUS ART THROMBECTOMY/INFUSION INTRACRANIAL INC DIAG ANGIO  11/25/2016  . IR RADIOLOGIST EVAL & MGMT  02/13/2017  . RADIOLOGY WITH ANESTHESIA N/A 11/25/2016   Procedure: RADIOLOGY WITH ANESTHESIA;  Surgeon: Radiologist, Medication, MD;  Location: [place];  Service: Radiology;  Laterality: N/A;    Vitals:   07/30/17 1352  BP: 116/71  Pulse: 69  SpO2: 100%    Subjective Assessment - 07/30/17 1351    Subjective  Pt reports he is doing well today. No specific questions or concerns at this time. No soreness after last session. Ready to discharge today. No pain currently    Pertinent History  Pt. is a 72 y.o. 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 Pt. was transferred to [facility]. He underwent CTA brain showed  short segment near occlusion of proximal R-ICA with associated intraluminal thrombus --likely due to dissection and emergent large vessel occlusion with right M2 occlusion and evolving right frontal lobe infarct.  He underwent cerebral angio with  complete revascularization of occluded superior division of R-MCA and near complete revascularization of proximal R-ICA with stent assisted angioplasty.  Stroke felt to be embolic in setting of ruptured plaque R-ICA and he was placed on ASA and brillinta due to stent. Pt. was transferred to inpatient rehab for several weeks, had home health therapy upon discharge.    Limitations  Walking;Lifting;House hold activities    How long can you sit comfortably?  unlimited     How long can you stand comfortably?  unlimited     How long can you walk comfortably?  unsure    Patient Stated Goals  "strengthen my whole L side"    Currently in Pain?  No/denies         [facility] PT Assessment - 07/30/17 1352      6 Minute Walk- Baseline   6 Minute Walk- Baseline  yes    BP (mmHg)  116/71    HR (bpm)  61    02 Sat (%RA)  100 %    Modified Borg Scale for Dyspnea  0- Nothing at all  Perceived Rate of Exertion (Borg)  6-      6 Minute walk- Post Test   6 Minute Walk Post Test  yes    BP (mmHg)  134/74    HR (bpm)  85    02 Sat (%RA)  100 %    Modified Borg Scale for Dyspnea  3- Moderate shortness of breath or breathing difficulty    Perceived Rate of Exertion (Borg)  7- Very, very light      6 minute walk test results    Aerobic Endurance Distance Walked  1425    Endurance additional comments  Very little fatigue reported by patient      Standardized Balance Assessment   Five times sit to stand comments   13.7s    10 Meter Walk  7.25s = 1.38 m/s      Berg Balance Test   Sit to Stand  Able to stand without using hands and stabilize independently    Standing Unsupported  Able to stand safely 2 minutes    Sitting with Back Unsupported but Feet Supported on  Floor or Stool  Able to sit safely and securely 2 minutes    Stand to Sit  Sits safely with minimal use of hands    Transfers  Able to transfer safely, minor use of hands    Standing Unsupported with Eyes Closed  Able to stand 10 seconds safely    Standing Ubsupported with Feet Together  Able to place feet together independently and stand 1 minute safely    From Standing, Reach Forward with Outstretched Arm  Can reach confidently >25 cm (10")    From Standing Position, Pick up Object from Floor  Able to pick up shoe safely and easily    From Standing Position, Turn to Look Behind Over each Shoulder  Looks behind from both sides and weight shifts well    Turn 360 Degrees  Able to turn 360 degrees safely in 4 seconds or less    Standing Unsupported, Alternately Place Feet on Step/Stool  Able to stand independently and safely and complete 8 steps in 20 seconds    Standing Unsupported, One Foot in Front  Able to place foot tandem independently and hold 30 seconds    Standing on One Leg  Able to lift leg independently and hold > 10 seconds    Total Score  56               TREATMENT  Ther-ex  Repeated outcome measures including 5TSTS (13.7s), 30mgait speed (7.25s = 1.38 m/s), BERG (56/56) and 6MWT (1425'), treadmill walking at 3% elevation and 1.5 mph; Reviewed HEP, return to golf education, and plan of care;             PT Education - 07/30/17 1356    Education provided  Yes    Education Details  outcome measures, goals, discharge    Person(s) Educated  Patient    Methods  Explanation    Comprehension  Verbalized understanding       PT Short Term Goals - 07/30/17 1358      PT SHORT TERM GOAL #1   Title  Pt will improve 5x sit to stand to 18 seconds in order to demonstrate improved LE strength and improved dynamic balance.    Baseline  21 sec; 02/26/17: 16.1s    Time  6    Period  Weeks    Status  Achieved      PT SHORT TERM  GOAL #2   Title  Pt will improve  gait speed to at least 1.0 m/s with proper heel strike and decreased hip hike in order to demonstrate imrpoved LE strength and balance.    Baseline  0.83 m/s; 02/26/17: 1.11 m/s    Time  6    Period  Weeks    Status  Achieved      PT SHORT TERM GOAL #3   Title  Pt will improve Berg Balance score to 51/56 in order to demonstrate improved dynamic and static balance, decreasing overall falls risk.    Baseline  48/56; 02/26/17: 53/56    Time  6    Period  Weeks    Status  Achieved        PT Long Term Goals - 07/30/17 1358      PT LONG TERM GOAL #1   Title  Pt will demonstrate independence with HEP in order to continue LE strengthening and manage symptoms.    Time  12    Period  Weeks    Status  Achieved    Target Date  08/11/17      PT LONG TERM GOAL #2   Title  Pt will improve 5x sit to stand to <15 seconds in order to demonstrate improved LE strength and dynamic balance.    Baseline  21 sec; 02/26/17: 16.1s 16.50 sec 04/23/17; 16 sec 05/26/17, 06/16/17 16 sec, 07/16/17= 14.19 sec; 07/30/17: 13.7s    Time  12    Period  Weeks    Status  Achieved      PT LONG TERM GOAL #3   Title  Pt will demonstrate gait speed of at least 1.2 m/s in order to demonstrate improved LE strength and improved balance in order to return to prior level of funciton.    Baseline  0.833 m/s 02/26/17: 1.11 m/s, 12/189/18 1.16 m/sec; 1.23 m/sec, 07/30/17: 1.38 m/s    Time  12    Period  Weeks    Status  Achieved      PT LONG TERM GOAL #4   Title  Pt will improve Berg Balance score to at least 54/56 in order demonstrate improved dynamic and static balance, decreasing overall falls risk.    Baseline  48/56; 02/26/17: 53/56; 04/23/17  53/56; 07/30/17: 56/56    Time  12    Period  Weeks    Status  Achieved    Target Date  08/11/17      PT LONG TERM GOAL #5   Title  Pt will demonstrate improved LE strength to 4+/5 in all limited planes in order to return to prior level of function.    Baseline  LLE hip flex  4/5, abd 4+/5, ext 4/5, LLE knee 5/5, ankle 5/5 LLE hip flex 4+/5, abd 4+/5, ankle 5/5; 07/30/17: at least 4+/5 throughout    Time  12    Period  Weeks    Status  Achieved    Target Date  08/11/17      PT LONG TERM GOAL #6   Title  Pt will improve 6MWT to >1500 feet without using AD in order to return to prior level of function, demonstrating improvements in LE strength, balance and endurance.    Baseline  1205 ft without AD; 02/26/17: deferred to next session 04/23/17 1420 ft, 1525 feet, 07/30/17: 1425'    Time  12    Period  Weeks    Status  Achieved      PT LONG TERM GOAL #  7   Title  Patient will perform TM walking elevated 3% with increased speed and no loss of balance for 5 mins to be able to walk a golf course.    Time  12    Period  Weeks    Status  Achieved    Target Date  08/11/17            Plan - 07/30/17 1420    Clinical Impression Statement  Pt has done very well with therapy and has met all of his goals. His gait speed is [facility] for full community ambulation. BERG score is 56/56 with single leg stance >15 seconds bilaterally. Pt is a low risk for falls. He is independent with his home exercise program and instructed in a gym based program he can perform after discharge. Pt demonstrates the ability to safely swing a golf club on an uneven surface and he plans to return to the driving range this weekend. Pt encouraged to follow-up as needed if has any future issues. He plans to continue with OT for UE strengthening. Pt will be discharged at this time.     Rehab Potential  Good    Clinical Impairments Affecting Rehab Potential  high prior level of function    PT Frequency  2x / week    PT Duration  12 weeks    PT Treatment/Interventions  ADLs/Self Care Home Management;Cryotherapy;Moist Heat;Gait training;Stair training;Functional mobility training;Therapeutic activities;Therapeutic exercise;Balance training;Neuromuscular re-education;Patient/family education;Manual techniques     PT Next Visit Plan  Discharge    PT Home Exercise Plan  Add green theraband to HEP exercises    Consulted and Agree with Plan of Care  Patient       Patient will benefit from skilled therapeutic intervention in order to improve the following deficits and impairments:  Abnormal gait, Decreased balance, Decreased strength, Difficulty walking, Increased edema  Visit Diagnosis: Muscle weakness (generalized)  Unsteadiness on feet     Problem List Patient Active Problem List   Diagnosis Date Noted  . Low vitamin B12 level 06/27/2017  . 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 Pure hypercholesterolemia 05/28/2017  . Erectile dysfunction 05/28/2017  . Left hemiparesis ([place])   . Essential hypertension 11/29/2016  . Acute ischemic stroke ([place]) - R MCA embolic stroke in setting of ruptured R ICA plaque w dissection, s/p R MCA stent and thrombectomy 11/25/2016  . Leukopenia 03/31/2016  . Hepatitis C 07/22/2013   Phillips Grout PT, DPT   Huprich,Jason 07/30/2017, 5:17 PM  [place] MAIN [facility] SERVICES [address] [place], [place], [address] Phone: [phone]   Fax:  [phone]  Name: Jason Nolan MRN: [id number] Date of Birth: [birth year]

## 2017-08-06 ENCOUNTER — Encounter: Payer: Self-pay | Admitting: Occupational Therapy

## 2017-08-06 ENCOUNTER — Ambulatory Visit: Payer: Medicare Other | Attending: Internal Medicine | Admitting: Occupational Therapy

## 2017-08-06 DIAGNOSIS — R278 Other lack of coordination: Secondary | ICD-10-CM | POA: Insufficient documentation

## 2017-08-06 DIAGNOSIS — R2681 Unsteadiness on feet: Secondary | ICD-10-CM

## 2017-08-06 DIAGNOSIS — M6281 Muscle weakness (generalized): Secondary | ICD-10-CM | POA: Insufficient documentation

## 2017-08-10 NOTE — Therapy (Signed)
[place] [facility] MAIN [facility] SERVICES [address] [place], [place], [address] Phone: [phone]   Fax:  [phone]  Occupational Therapy Treatment  Patient Details  Name: Monroe Qin MRN: [id number] Date of Birth: 1945/12/30 Referring Provider: [name]   Encounter Date: 08/06/2017  OT End of Session - 08/10/17 1108    Visit Number  38    Number of Visits  48    Date for OT Re-Evaluation  09/29/17    OT Start Time  1445    OT Stop Time  1530    OT Time Calculation (min)  45 min    Activity Tolerance  Patient tolerated treatment well    Behavior During Therapy  [facility] for tasks assessed/performed       Past Medical History:  Diagnosis Date  . Glaucoma   . Hepatitis C   . Stroke [facility])     Past Surgical History:  Procedure Laterality Date  . IR ANGIO INTRA EXTRACRAN SEL COM CAROTID INNOMINATE UNI L MOD SED  11/25/2016  . IR ANGIO VERTEBRAL SEL SUBCLAVIAN INNOMINATE UNI R MOD SED  11/25/2016  . IR ANGIO VERTEBRAL SEL VERTEBRAL UNI L MOD SED  11/25/2016  . IR INTRAVSC STENT CERV CAROTID W/O EMB-PROT MOD SED INC ANGIO  11/25/2016  . IR PERCUTANEOUS ART THROMBECTOMY/INFUSION INTRACRANIAL INC DIAG ANGIO  11/25/2016  . IR RADIOLOGIST EVAL & MGMT  02/13/2017  . RADIOLOGY WITH ANESTHESIA N/A 11/25/2016   Procedure: RADIOLOGY WITH ANESTHESIA;  Surgeon: Radiologist, Medication, MD;  Location: [facility] OR;  Service: Radiology;  Laterality: N/A;    There were no vitals filed for this visit.  Subjective Assessment - 08/10/17 1107    Subjective   Patient reports he is doing well and continues to make progress. Patient reports he has not been able to hold a pen or write his name with his dominant left hand     Pertinent History  Pt. is a 72 y.o. 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 Pt. was transferred to [facility]. He underwent CTA brain showed short segment near occlusion of proximal R-ICA with associated intraluminal thrombus --likely due  to dissection and emergent large vessel occlusion with right M2 occlusion and evolving right frontal lobe infarct.  He underwent cerebral angio with  complete revascularization of occluded superior division of R-MCA and near complete revascularization of proximal R-ICA with stent assisted angioplasty.  Stroke felt to be embolic in setting of ruptured plaque R-ICA and he was placed on ASA and brillinta due to stent. Pt. was transferred to inpatient rehab for several weeks, had home health therapy upon discharge, and now is ready for outpatient therapy services.    Limitations  Dominant LUE functioning, left hand edema, distal ROM, coordination, and overal strength.    Patient Stated Goals  To regain the use of his LUE.    Currently in Pain?  No/denies    Pain Score  0-No pain                   OT Treatments/Exercises (OP) - 08/10/17 2024      ADLs   ADL Comments  Patient seen for left hand dominant handwriting, able to hold the pen in his left hand with tripod grasp and able to formulate letters of first and last name with fair legibility.  Decreased legibilty when writing address.  Printing only.      Neurological Re-education Exercises   Other Exercises 1  Patient seen  for red theraputty exercises for gripping patterns left hand, Lateral, 3 point and 2 point pinches with tactile and verbal cues for technique.  Difficulty with 3 point pinch prehension pattern .  Left hand resistive pulls, twists and supination.    Other Exercises 2  Patient seen for manipulation of 100 pegboard/judy board with picking up and attempts for turning/flipping to opposite end, can pick up from the table and place into grid but has difficulty with picking up from grid and turning.  Decreased manipulation skills, cues provided for prehension patterns.               OT Education - 08/10/17 1108    Education provided  Yes    Education Details  coordination tasks    Person(s) Educated  Patient     Methods  Explanation;Demonstration;Verbal cues    Comprehension  Verbalized understanding;Returned demonstration;Verbal cues required          OT Long Term Goals - 08/06/17 1453      OT LONG TERM GOAL #1   Title  Pt. will improve edema by 2 cm through MPs in preparation for ROM.    Baseline  improving 07/07/2017: wrist: 19cm., MPs: 24 cm.    Time  12    Period  Weeks    Status  On-going      OT LONG TERM GOAL #2   Title  Pt. will improve Left hand digit extension in preparation for weightbearing, and releasing objects during ADLs/IADLs.    Baseline  Pt. is unable to achieve full digit extension on the left. 07/07/2017: Pt. is able to achieve full digit extension.    Time  12    Period  Weeks    Status  Achieved      OT LONG TERM GOAL #3   Title  Pt. left hand grip with  increase by 5# in preparation for holding a cup.    Baseline  Left 2#, RIght: 60 07/07/2017: Left: 13    Time  12    Period  Weeks    Status  On-going      OT LONG TERM GOAL #4   Title  Pt. will increased right shoulder strength by 1 mm grade to assist with ADLs.    Baseline  07/07/2017: progressing.    Time  12    Period  Weeks      OT LONG TERM GOAL #5   Title  Pt. will increase left wrist extension in preparation for reaching ADL items.    Baseline  wrist extension 28 degrees, 07/07/2017: wrist extension: 65    Time  12    Period  Weeks    Status  Achieved      OT LONG TERM GOAL #6   Title  Pt. will improve left hand coordination skills to be able to button clothing.    Baseline  Pt. continues to have difficulty 07/07/2017: 4 min. via 9 hole peg test    Time  12    Period  Weeks    Status  On-going      OT LONG TERM GOAL #7   Title  Pt. will independently demonstrate visual compensatory strategies duirng ADLs, and IADLs.    Baseline  Glaucoma    Time  12    Period  Weeks    Status  On-going      OT LONG TERM GOAL #8   Title  Pt. will increase Left pinch strength to be able to grasp and  hold items  during ADL tasks.    Baseline  07/07/04: lateral pinch: 8#, 3pt. pinch 6#    Time  12    Period  Weeks    Status  On-going      OT LONG TERM GOAL  #9   Baseline  Pt. will write one sentence with 75% legibility and efficiently while maintaing grasp on pen 100% of the time.    Time  12    Period  Weeks    Status  On-going      OT LONG TERM GOAL  #10   TITLE  Pt. will independently use his left hand to grasp and efficiently accomodate to the varying weights of ADL objects during self care tasks.     Baseline  07/07/2017: Pt. has difficulty grasping and adjusting hand to the varying weights of objects.    Time  12    Period  Weeks    Status  On-going            Plan - 08/10/17 1109    Clinical Impression Statement  Patient continues to work towards advancing with prehension patterns to pick up and manipulate items, oppositional movements with thumb to index finger, continues to have difficulty with manipulation skills, translatory skills of the hand and using the hand for storage.  He was able to demonstrate a tripod grasp of pen this date for the first time with his dominant left hand and able to form his name and address.  Name was performed with fair legibility, address had legibility less than 50%.  Continue to work towards goals to improve left hand functional use for daily tasks.     Occupational performance deficits (Please refer to evaluation for details):  ADL's;IADL's    Rehab Potential  Excellent    OT Frequency  2x / week    OT Duration  12 weeks    OT Treatment/Interventions  Self-care/ADL training;Therapeutic exercise;Neuromuscular education;Patient/family education;Energy conservation;Therapeutic activities;DME and/or AE instruction    Consulted and Agree with Plan of Care  Patient       Patient will benefit from skilled therapeutic intervention in order to improve the following deficits and impairments:  Abnormal gait, Pain, Decreased strength, Decreased range of  motion, Impaired UE functional use, Decreased coordination, Decreased knowledge of use of DME, Decreased activity tolerance, Difficulty walking, Decreased balance, Other (comment)  Visit Diagnosis: Muscle weakness (generalized)  Other lack of coordination  Unsteadiness on feet    Problem List Patient Active Problem List   Diagnosis Date Noted  . Low vitamin B12 level 06/27/2017  . 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 Pure hypercholesterolemia 05/28/2017  . Erectile dysfunction 05/28/2017  . Left hemiparesis (HCC)   . Essential hypertension 11/29/2016  . 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 Leukopenia 03/31/2016  . Hepatitis C 07/22/2013   Charnelle Bergeman T Arne ClevelandLovett, OTR/L, CLT  Somnang Mahan 08/10/2017, 8:29 PM  [place] [facility] MAIN [facility] [address] [place], [place], [address] Phone: [phone]   Fax:  [phone]  Name: Terrilee CroakJames Siverson MRN: [id number] Date of Birth: 09/15/1945

## 2017-08-11 ENCOUNTER — Other Ambulatory Visit: Payer: Self-pay

## 2017-08-11 ENCOUNTER — Ambulatory Visit: Payer: Medicare Other | Admitting: Occupational Therapy

## 2017-08-11 ENCOUNTER — Encounter: Payer: Self-pay | Admitting: Occupational Therapy

## 2017-08-11 DIAGNOSIS — R2681 Unsteadiness on feet: Secondary | ICD-10-CM | POA: Diagnosis not present

## 2017-08-11 DIAGNOSIS — R278 Other lack of coordination: Secondary | ICD-10-CM | POA: Diagnosis not present

## 2017-08-11 DIAGNOSIS — M6281 Muscle weakness (generalized): Secondary | ICD-10-CM

## 2017-08-11 NOTE — Therapy (Signed)
[place] [facility] MAIN [facility] SERVICES [address] [place], [place], [address] Phone: [phone]   Fax:  [phone]  Occupational Therapy Treatment  Patient Details  Name: Jason Nolan MRN: [id number] Date of Birth: 18-Feb-1946 Referring Provider: [name]   Encounter Date: 08/11/2017  OT End of Session - 08/11/17 1722    Visit Number  39    Number of Visits  48    Date for OT Re-Evaluation  09/29/17    Authorization Type  Medicare G-Code 6    OT Start Time  1300    OT Stop Time  1345    OT Time Calculation (min)  45 min    Activity Tolerance  Patient tolerated treatment well    Behavior During Therapy  [facility] for tasks assessed/performed       Past Medical History:  Diagnosis Date  . Glaucoma   . Hepatitis C   . Stroke [facility])     Past Surgical History:  Procedure Laterality Date  . IR ANGIO INTRA EXTRACRAN SEL COM CAROTID INNOMINATE UNI L MOD SED  11/25/2016  . IR ANGIO VERTEBRAL SEL SUBCLAVIAN INNOMINATE UNI R MOD SED  11/25/2016  . IR ANGIO VERTEBRAL SEL VERTEBRAL UNI L MOD SED  11/25/2016  . IR INTRAVSC STENT CERV CAROTID W/O EMB-PROT MOD SED INC ANGIO  11/25/2016  . IR PERCUTANEOUS ART THROMBECTOMY/INFUSION INTRACRANIAL INC DIAG ANGIO  11/25/2016  . IR RADIOLOGIST EVAL & MGMT  02/13/2017  . RADIOLOGY WITH ANESTHESIA N/A 11/25/2016   Procedure: RADIOLOGY WITH ANESTHESIA;  Surgeon: Radiologist, Medication, MD;  Location: [facility] OR;  Service: Radiology;  Laterality: N/A;    There were no vitals filed for this visit.  Subjective Assessment - 08/11/17 1714    Subjective   Pt reports he is having a good day but just wants his left hand to improve faster than it is.    Patient is accompained by:  Family member    Pertinent History  Pt. is a 72 y.o. 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 Pt. was transferred to [facility]. He underwent CTA brain showed short segment near occlusion of proximal R-ICA with associated intraluminal  thrombus --likely due to dissection and emergent large vessel occlusion with right M2 occlusion and evolving right frontal lobe infarct.  He underwent cerebral angio with  complete revascularization of occluded superior division of R-MCA and near complete revascularization of proximal R-ICA with stent assisted angioplasty.  Stroke felt to be embolic in setting of ruptured plaque R-ICA and he was placed on ASA and brillinta due to stent. Pt. was transferred to inpatient rehab for several weeks, had home health therapy upon discharge, and now is ready for outpatient therapy services.    Limitations  Dominant LUE functioning, left hand edema, distal ROM, coordination, and overal strength.    Patient Stated Goals  To regain the use of his LUE.    Currently in Pain?  No/denies                   OT Treatments/Exercises (OP) - 08/11/17 1714      ADLs   ADL Comments  Patient seen to assess if pen grippers would help with writing legibility but they did not.  A  pen with a thicker circumference with a built in grip appeared to work the best for him.  Able to print first and last name and street number only, city and street name not legible.  Pt not able  to complete cursive legibly.        Neurological Re-education Exercises   Other Exercises 1  Patient seen for red theraputty exercises for left hand Lateral, 3 point and 2 point pinches with tactile and verbal cues for technique.  Difficulty with isolated finger movements.  Pt given putty to take home to use since he stated his was a different color and not challenging him enough.    Other Exercises 2  Worked on control of 2 pt pincer grasp with supination and pronation using colored disks on table top.  Patient able to turn over 75% during task.               OT Education - 08/11/17 1722    Education provided  Yes    Education Details  HEP for fine motor tasks    Person(s) Educated  Patient    Methods  Explanation;Demonstration;Verbal  cues    Comprehension  Verbalized understanding;Returned demonstration;Verbal cues required          OT Long Term Goals - 08/06/17 1453      OT LONG TERM GOAL #1   Title  Pt. will improve edema by 2 cm through MPs in preparation for ROM.    Baseline  improving 07/07/2017: wrist: 19cm., MPs: 24 cm.    Time  12    Period  Weeks    Status  On-going      OT LONG TERM GOAL #2   Title  Pt. will improve Left hand digit extension in preparation for weightbearing, and releasing objects during ADLs/IADLs.    Baseline  Pt. is unable to achieve full digit extension on the left. 07/07/2017: Pt. is able to achieve full digit extension.    Time  12    Period  Weeks    Status  Achieved      OT LONG TERM GOAL #3   Title  Pt. left hand grip with  increase by 5# in preparation for holding a cup.    Baseline  Left 2#, RIght: 60 07/07/2017: Left: 13    Time  12    Period  Weeks    Status  On-going      OT LONG TERM GOAL #4   Title  Pt. will increased right shoulder strength by 1 mm grade to assist with ADLs.    Baseline  07/07/2017: progressing.    Time  12    Period  Weeks      OT LONG TERM GOAL #5   Title  Pt. will increase left wrist extension in preparation for reaching ADL items.    Baseline  wrist extension 28 degrees, 07/07/2017: wrist extension: 65    Time  12    Period  Weeks    Status  Achieved      OT LONG TERM GOAL #6   Title  Pt. will improve left hand coordination skills to be able to button clothing.    Baseline  Pt. continues to have difficulty 07/07/2017: 4 min. via 9 hole peg test    Time  12    Period  Weeks    Status  On-going      OT LONG TERM GOAL #7   Title  Pt. will independently demonstrate visual compensatory strategies duirng ADLs, and IADLs.    Baseline  Glaucoma    Time  12    Period  Weeks    Status  On-going      OT LONG TERM GOAL #8  Title  Pt. will increase Left pinch strength to be able to grasp and hold items during ADL tasks.    Baseline  07/07/04:  lateral pinch: 8#, 3pt. pinch 6#    Time  12    Period  Weeks    Status  On-going      OT LONG TERM GOAL  #9   Baseline  Pt. will write one sentence with 75% legibility and efficiently while maintaing grasp on pen 100% of the time.    Time  12    Period  Weeks    Status  On-going      OT LONG TERM GOAL  #10   TITLE  Pt. will independently use his left hand to grasp and efficiently accomodate to the varying weights of ADL objects during self care tasks.     Baseline  07/07/2017: Pt. has difficulty grasping and adjusting hand to the varying weights of objects.    Time  12    Period  Weeks    Status  On-going            Plan - 08/11/17 1722    Clinical Impression Statement  Patient continues to be motivated to regain use of L hand and continues doing warm Epsom salt baths followed by ice to help control edema and wears a splint on L hand at night.  He is working on strengthening and coordination exercises to help with control for ADLs and writing tasks.  Tried different pen grippers today and a larger circumference pen with built in grip appeared to help the best.  He was able to print first and last name and street number today but cursive continues to be difficult and not yet legible.      Occupational performance deficits (Please refer to evaluation for details):  ADL's;IADL's    Rehab Potential  Excellent    OT Frequency  2x / week    OT Duration  12 weeks    OT Treatment/Interventions  Self-care/ADL training;Therapeutic exercise;Neuromuscular education;Patient/family education;Energy conservation;Therapeutic activities;DME and/or AE instruction    Clinical Decision Making  Multiple treatment options, significant modification of task necessary    Consulted and Agree with Plan of Care  Patient       Patient will benefit from skilled therapeutic intervention in order to improve the following deficits and impairments:  Abnormal gait, Pain, Decreased strength, Decreased range of  motion, Impaired UE functional use, Decreased coordination, Decreased knowledge of use of DME, Decreased activity tolerance, Difficulty walking, Decreased balance, Other (comment)  Visit Diagnosis: Muscle weakness (generalized)  Other lack of coordination    Problem List Patient Active Problem List   Diagnosis Date Noted  . Low vitamin B12 level 06/27/2017  . 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 Pure hypercholesterolemia 05/28/2017  . Erectile dysfunction 05/28/2017  . Left hemiparesis (HCC)   . Essential hypertension 11/29/2016  . 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 Leukopenia 03/31/2016  . Hepatitis C 07/22/2013    Susanne BordersSusan Genell Thede, OTR/L ascom [phone] 08/11/17, 5:28 PM  [facility] MAIN [facility] [address] [place], [place], [address] Phone: [phone]   Fax:  [phone]  Name: Jason CroakJames Nolan MRN: [id number] Date of Birth: 1945/09/11

## 2017-08-13 ENCOUNTER — Encounter: Payer: Self-pay | Admitting: Occupational Therapy

## 2017-08-13 ENCOUNTER — Ambulatory Visit: Payer: Medicare Other | Admitting: Occupational Therapy

## 2017-08-13 DIAGNOSIS — M6281 Muscle weakness (generalized): Secondary | ICD-10-CM | POA: Diagnosis not present

## 2017-08-13 DIAGNOSIS — R2681 Unsteadiness on feet: Secondary | ICD-10-CM | POA: Diagnosis not present

## 2017-08-13 DIAGNOSIS — R278 Other lack of coordination: Secondary | ICD-10-CM | POA: Diagnosis not present

## 2017-08-13 NOTE — Therapy (Signed)
[facility]IN [facility] [address] [place], [place], [address] Phone: [phone]   Fax:  [phone]  Occupational Therapy Treatment  Patient Details  Name: Jason CroakJames Nolan MRN: [id number] Date of Birth: [birth year] Referring Provider: Lorre MunroeBAITY, REGINA W   Encounter Date: 08/13/2017  OT End of Session - 08/13/17 1225    Visit Number  40    Number of Visits  48    Date for OT Re-Evaluation  09/29/17    OT Start Time  1152    OT Stop Time  1230    OT Time Calculation (min)  38 min    Activity Tolerance  Patient tolerated treatment well    Behavior During Therapy  [facility] for tasks assessed/performed       Past Medical History:  Diagnosis Date  . Glaucoma   . Hepatitis C   . Stroke [facility](HCC)     Past Surgical History:  Procedure Laterality Date  . IR ANGIO INTRA EXTRACRAN SEL COM CAROTID INNOMINATE UNI L MOD SED  11/25/2016  . IR ANGIO VERTEBRAL SEL SUBCLAVIAN INNOMINATE UNI R MOD SED  11/25/2016  . IR ANGIO VERTEBRAL SEL VERTEBRAL UNI L MOD SED  11/25/2016  . IR INTRAVSC STENT CERV CAROTID W/O EMB-PROT MOD SED INC ANGIO  11/25/2016  . IR PERCUTANEOUS ART THROMBECTOMY/INFUSION INTRACRANIAL INC DIAG ANGIO  11/25/2016  . IR RADIOLOGIST EVAL & MGMT  02/13/2017  . RADIOLOGY WITH ANESTHESIA N/A 11/25/2016   Procedure: RADIOLOGY WITH ANESTHESIA;  Surgeon: Radiologist, Medication, MD;  Location: [facility] OR;  Service: Radiology;  Laterality: N/A;    There were no vitals filed for this visit.  Subjective Assessment - 08/13/17 1222    Subjective   [place].     Patient is accompained by:  Family member    Pertinent History  Pt. is a 72 y.o. 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 [facility] He underwent CTA brain showed short segment near occlusion of proximal R-ICA with associated intraluminal thrombus --likely due to dissection and emergent large vessel occlusion with right M2 occlusion  and evolving right frontal lobe infarct.  He underwent cerebral angio with  complete revascularization of occluded superior division of R-MCA and near complete revascularization of proximal R-ICA with stent assisted angioplasty.  Stroke felt to be embolic in setting of ruptured plaque R-ICA and he was placed on ASA and brillinta due to stent. Pt. was transferred to inpatient rehab for several weeks, had home health therapy upon discharge, and now is ready for outpatient therapy services.    Limitations  Dominant LUE functioning, left hand edema, distal ROM, coordination, and overal strength.    Currently in Pain?  No/denies       OT TREATMENT    Neuro muscular re-education:  Pt. worked on grasping 1/3 inch flat objects with his 2nd digit, and thumb. Pt. worked on sliding them off of a surface in preparation for stacking them over vertical dowels. Pt. Required cues for movement patterns.  Therapeutic Exercise:  Pt. Worked on the [employer] for 8 min. With constant monitoring of the BUEs. Pt. Worked on changing, and alternating forward reverse position every 2 min. Rest breaks were required. Pt. Worked on pinch strengthening in the left hand for lateral, and 3pt. pinch using red, green, and blue resistive clips. Pt. worked on placing the clips at various vertical and horizontal angles. Tactile and verbal cues were required for eliciting  the desired movement.                        OT Education - 08/13/17 1224    Education provided  Yes    Education Details  UE strength, and coordination skills.    Person(s) Educated  Patient    Methods  Explanation;Demonstration;Verbal cues    Comprehension  Verbalized understanding;Returned demonstration;Verbal cues required          OT Long Term Goals - 08/06/17 1453      OT LONG TERM GOAL #1   Title  Pt. will improve edema by 2 cm through MPs in preparation for ROM.    Baseline  improving 07/07/2017: wrist: 19cm., MPs: 24 cm.     Time  12    Period  Weeks    Status  On-going      OT LONG TERM GOAL #2   Title  Pt. will improve Left hand digit extension in preparation for weightbearing, and releasing objects during ADLs/IADLs.    Baseline  Pt. is unable to achieve full digit extension on the left. 07/07/2017: Pt. is able to achieve full digit extension.    Time  12    Period  Weeks    Status  Achieved      OT LONG TERM GOAL #3   Title  Pt. left hand grip with  increase by 5# in preparation for holding a cup.    Baseline  Left 2#, RIght: 60 07/07/2017: Left: 13    Time  12    Period  Weeks    Status  On-going      OT LONG TERM GOAL #4   Title  Pt. will increased right shoulder strength by 1 mm grade to assist with ADLs.    Baseline  07/07/2017: progressing.    Time  12    Period  Weeks      OT LONG TERM GOAL #5   Title  Pt. will increase left wrist extension in preparation for reaching ADL items.    Baseline  wrist extension 28 degrees, 07/07/2017: wrist extension: 65    Time  12    Period  Weeks    Status  Achieved      OT LONG TERM GOAL #6   Title  Pt. will improve left hand coordination skills to be able to button clothing.    Baseline  Pt. continues to have difficulty 07/07/2017: 4 min. via 9 hole peg test    Time  12    Period  Weeks    Status  On-going      OT LONG TERM GOAL #7   Title  Pt. will independently demonstrate visual compensatory strategies duirng ADLs, and IADLs.    Baseline  Glaucoma    Time  12    Period  Weeks    Status  On-going      OT LONG TERM GOAL #8   Title  Pt. will increase Left pinch strength to be able to grasp and hold items during ADL tasks.    Baseline  07/07/04: lateral pinch: 8#, 3pt. pinch 6#    Time  12    Period  Weeks    Status  On-going      OT LONG TERM GOAL  #9   Baseline  Pt. will write one sentence with 75% legibility and efficiently while maintaing grasp on pen 100% of the time.    Time  12    Period  Weeks    Status  On-going      OT LONG TERM GOAL   #10   TITLE  Pt. will independently use his left hand to grasp and efficiently accomodate to the varying weights of ADL objects during self care tasks.     Baseline  07/07/2017: Pt. has difficulty grasping and adjusting hand to the varying weights of objects.    Time  12    Period  Weeks    Status  On-going            Plan - 08/13/17 1227    Clinical Impression Statement  Pt. has joined a gym. Pt. has been working with his hand at home. Pt. continues to work on improving LUE strength, and coordination skills for improved engagement in ADL, and IADL tasks. Pt continues to work on improving thumb, and 2nd digit movements together to grasp, and stack flat objects.    Occupational performance deficits (Please refer to evaluation for details):  ADL's;IADL's    Rehab Potential  Excellent    OT Frequency  2x / week    OT Duration  12 weeks    OT Treatment/Interventions  Self-care/ADL training;Therapeutic exercise;Neuromuscular education;Patient/family education;Energy conservation;Therapeutic activities;DME and/or AE instruction    Clinical Decision Making  Multiple treatment options, significant modification of task necessary    Consulted and Agree with Plan of Care  Patient       Patient will benefit from skilled therapeutic intervention in order to improve the following deficits and impairments:  Abnormal gait, Pain, Decreased strength, Decreased range of motion, Impaired UE functional use, Decreased coordination, Decreased knowledge of use of DME, Decreased activity tolerance, Difficulty walking, Decreased balance, Other (comment)  Visit Diagnosis: Muscle weakness (generalized)    Problem List Patient Active Problem List   Diagnosis Date Noted  . Low vitamin B12 level 06/27/2017  . 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 Pure hypercholesterolemia 05/28/2017  . Erectile dysfunction 05/28/2017  . Left hemiparesis (HCC)   . Essential hypertension 11/29/2016  . Acute  ischemic stroke (HCC) - R MCA embolic stroke in setting of ruptured R ICA plaque w dissection, s/p R MCA stent and thrombectomy 11/25/2016  . Leukopenia 03/31/2016  . Hepatitis C 07/22/2013    Olegario Messier, MS, OTR/L 08/13/2017, 12:56 PM   [facility] MAIN [facility] SERVICES [address] [place], [place], [address] Phone: [phone]   Fax:  [phone]  Name: Jamonta Goerner MRN: [id number] Date of Birth: 01-20-1946

## 2017-08-16 ENCOUNTER — Other Ambulatory Visit: Payer: Self-pay | Admitting: Internal Medicine

## 2017-08-16 DIAGNOSIS — I639 Cerebral infarction, unspecified: Secondary | ICD-10-CM

## 2017-08-18 ENCOUNTER — Ambulatory Visit: Payer: Medicare Other | Admitting: Occupational Therapy

## 2017-08-20 ENCOUNTER — Encounter: Payer: Self-pay | Admitting: Occupational Therapy

## 2017-08-20 ENCOUNTER — Ambulatory Visit: Payer: Medicare Other | Admitting: Occupational Therapy

## 2017-08-20 DIAGNOSIS — R278 Other lack of coordination: Secondary | ICD-10-CM | POA: Diagnosis not present

## 2017-08-20 DIAGNOSIS — M6281 Muscle weakness (generalized): Secondary | ICD-10-CM | POA: Diagnosis not present

## 2017-08-20 DIAGNOSIS — R2681 Unsteadiness on feet: Secondary | ICD-10-CM | POA: Diagnosis not present

## 2017-08-20 NOTE — Therapy (Addendum)
[place] [facility] MAIN [facility] SERVICES [address] [place], [place], [address] Phone: [phone]   Fax:  [phone]  Occupational Therapy Progress Note  Dates of reporting period  07/09/2017   to   08/20/2017 Patient Details  Name: Jason Nolan MRN: [id number] Date of Birth: [birth year] Referring Provider: [name]   Encounter Date: 08/20/2017  OT End of Session - 08/20/17 1158    Visit Number  41    Number of Visits  48    Date for OT Re-Evaluation  09/29/17    OT Start Time  1145    OT Stop Time  1230    OT Time Calculation (min)  45 min    Activity Tolerance  Patient tolerated treatment well    Behavior During Therapy  [facility] for tasks assessed/performed       Past Medical History:  Diagnosis Date  . Glaucoma   . Hepatitis C   . Stroke [facility]     Past Surgical History:  Procedure Laterality Date  . IR ANGIO INTRA EXTRACRAN SEL COM CAROTID INNOMINATE UNI L MOD SED  11/25/2016  . IR ANGIO VERTEBRAL SEL SUBCLAVIAN INNOMINATE UNI R MOD SED  11/25/2016  . IR ANGIO VERTEBRAL SEL VERTEBRAL UNI L MOD SED  11/25/2016  . IR INTRAVSC STENT CERV CAROTID W/O EMB-PROT MOD SED INC ANGIO  11/25/2016  . IR PERCUTANEOUS ART THROMBECTOMY/INFUSION INTRACRANIAL INC DIAG ANGIO  11/25/2016  . IR RADIOLOGIST EVAL & MGMT  02/13/2017  . RADIOLOGY WITH ANESTHESIA N/A 11/25/2016   Procedure: RADIOLOGY WITH ANESTHESIA;  Surgeon: Radiologist, Medication, MD;  Location: [facility] OR;  Service: Radiology;  Laterality: N/A;    There were no vitals filed for this visit.  Subjective Assessment - 08/20/17 1204    Subjective   Pt. is planning to go to [place] next Tuesday.    Patient is accompained by:  Family member    Pertinent History  Pt. is a 72 y.o. 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 Pt. was transferred to [facility]. He underwent CTA brain showed short segment near occlusion of proximal R-ICA with associated intraluminal thrombus --likely due to  dissection and emergent large vessel occlusion with right M2 occlusion and evolving right frontal lobe infarct.  He underwent cerebral angio with  complete revascularization of occluded superior division of R-MCA and near complete revascularization of proximal R-ICA with stent assisted angioplasty.  Stroke felt to be embolic in setting of ruptured plaque R-ICA and he was placed on ASA and brillinta due to stent. Pt. was transferred to inpatient rehab for several weeks, had home health therapy upon discharge, and now is ready for outpatient therapy services.    Limitations  Dominant LUE functioning, left hand edema, distal ROM, coordination, and overal strength.    Patient Stated Goals  To regain the use of his LUE.    Currently in Pain?  No/denies      OT TREATMENT    Goals were reviewed with her pt.   Therapeutic Exercise:  Pt. worked with the digit flex 1.5#, attempting to alternate the digits. Pt. Had more difficulty with the 4th, and 5th digits. Pt. worked on Airline pilot with green theraband using the knob turn, cap turn, peg turn, L-Bar and key turn attachments to target specific components of movements in the hand. Pt. worked on using the cap turn attachment for standard turning, Pt. worked with the [employer] to work to focus on standard  gripping,  and weightbearing push. Pt. worked on using the key turn attachment for standard turning, lateral pinch turn, and lateral pinch pull. Cues, and assist were provided for form.                         OT Education - 08/20/17 1156    Education provided  Yes    Education Details  UE strengthening    Person(s) Educated  Patient    Methods  Explanation;Demonstration;Verbal cues    Comprehension  Verbalized understanding;Returned demonstration;Verbal cues required          OT Long Term Goals - 08/20/17 1239      OT LONG TERM GOAL #1   Title  Pt. will improve edema by 2 cm through  MPs in preparation for ROM.    Baseline  08/20/2017: Edema, and ROM in the left hand have improved.    Time  12    Period  Weeks    Status  On-going    Target Date  09/29/17      OT LONG TERM GOAL #3   Title  Pt. left hand grip with  increase by 5# in preparation for holding a cup.    Baseline  08/10/2017: Pt. conitnues to have difficulty holding a cup.     Time  12    Period  Weeks    Status  On-going    Target Date  09/29/17      OT LONG TERM GOAL #4   Title  Pt. will increased right shoulder strength by 1 mm grade to assist with ADLs.    Baseline  08/20/2017: Pt. is progressing, however LUE strength is limited.    Time  12    Period  Weeks    Status  On-going    Target Date  09/29/17      OT LONG TERM GOAL #6   Title  Pt. will improve left hand coordination skills to be able to button clothing.    Baseline  08/20/2017: pt. continues to present with limited Left hand [facility] skills, and has difficulty buttoning.    Time  12    Period  Weeks    Status  On-going    Target Date  09/29/17      OT LONG TERM GOAL #7   Title  Pt. will independently demonstrate visual compensatory strategies duirng ADLs, and IADLs.    Baseline  Glaucoma    Time  12    Period  Weeks    Status  On-going    Target Date  09/29/17      OT LONG TERM GOAL #8   Title  Pt. will increase Left pinch strength to be able to grasp and hold items during ADL tasks.    Baseline  08/20/2017: Pt. has difficulty with hand position with compensation proximally when grasping for objects with the left hand.    Time  12    Status  On-going    Target Date  09/29/17      OT LONG TERM GOAL  #9   Baseline  Pt. will write one sentence with 75% legibility and efficiently while maintaing grasp on pen 100% of the time.    Time  12    Period  Weeks    Status  On-going    Target Date  09/29/17      OT LONG TERM GOAL  #10   TITLE  Pt. will independently use his left hand  to grasp and efficiently accomodate to the varying weights  of ADL objects during self care tasks.     Baseline  08/20/2017: Pt. has difficulty grasping and adjusting hand to the varying weights of objects.    Time  12    Period  Weeks    Status  On-going    Target Date  09/29/17            Plan - 08/20/17 1206    Clinical Impression Statement  Pt. reports he has an appointment with Dr. Pearlean Brownie next Monday. Pt. is using his left hand during more household tasks. Pt. is able to use his hand with more motor control when reaching up to turn out the light above his stove, Pt. is actively using his left hand as a gross stabilizer to the right hand when opening bottles, and is now using his right hand more during [facility] tasks during ADLs, and IADLs. Pt. continues to have difficulty unlocking a door, picking up objects from the floor, and holding a golf club.  Pt. continues to work on improving LUE, hand strength, hand function, translatory movements of the hand as well as coordination skills.    Occupational performance deficits (Please refer to evaluation for details):  ADL's;IADL's    Rehab Potential  Excellent    OT Frequency  2x / week    OT Duration  12 weeks    OT Treatment/Interventions  Self-care/ADL training;Therapeutic exercise;Neuromuscular education;Patient/family education;Energy conservation;Therapeutic activities;DME and/or AE instruction    Clinical Decision Making  Multiple treatment options, significant modification of task necessary    Consulted and Agree with Plan of Care  Patient       Patient will benefit from skilled therapeutic intervention in order to improve the following deficits and impairments:  Abnormal gait, Pain, Decreased strength, Decreased range of motion, Impaired UE functional use, Decreased coordination, Decreased knowledge of use of DME, Decreased activity tolerance, Difficulty walking, Decreased balance, Other (comment)  Visit Diagnosis: Muscle weakness (generalized)  Other lack of coordination    Problem  List Patient Active Problem List   Diagnosis Date Noted  . Low vitamin B12 level 06/27/2017  . 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 Pure hypercholesterolemia 05/28/2017  . Erectile dysfunction 05/28/2017  . Left hemiparesis (HCC)   . Essential hypertension 11/29/2016  . 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 Leukopenia 03/31/2016  . Hepatitis C 07/22/2013    Olegario Messier, MS, OTR/L 08/20/2017, 12:47 PM  [place] [facility] MAIN [facility] SERVICES [address] [place], [place], [address] Phone: [phone]   Fax:  [phone]  Name: Jason Nolan MRN: [id number] Date of Birth: 1945-08-15

## 2017-08-22 NOTE — Progress Notes (Signed)
[facility]  PATIENT: Sasuke Yaffe DOB: 1945-06-29   REASON FOR VISIT: Follow-up for stroke in July 2018 HISTORY FROM: Patient    HISTORY OF PRESENT ILLNESS: 02/10/17 PSMr. Somers is a pleasant 72 year old African-American male seen today for the first office follow-up visit following hospital admission for stroke in July 2018. He is accompanied by his wife. History is obtained from them as well as review of hospital medical records and have personally reviewed imaging films. Raden Byington a 72 y.o.left-handed malewho has a past medical history of hepatitis C treated with Harvoni, who presented with Left-sided weakness, slurred speech, left facial droop. He was in his usual state of health and last normal at 2 AM when he went to bed, woke up on the morning of 7/23/2018around 9 AM with left-sided weakness, left facial droop and slurred speech. 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 His NIH stroke scale at that hospital was recorded to be 6. Noncontrast head CT showed early changes of ischemia in the right MCA territory and a hyperdense right MCA. He was transferred over to [facility] for evaluation for possible thrombectomy. He was not given IV TPA because she was outside of the TPA window at the time of presentation. The patient is now s/p thrombectomy and revascularization of superior division of R MCA with T1C1 reperfusion.  LKW: 0200 on 11/25/2016. Modified Rankin Score:0 Patient was not administered IV t-PA secondary to arriving outside of the treatment widnow. Hewas admitted to the neuro ICU for further evaluation and treatment.The patient underwent emergent proximal right ICA angioplasty with stenting followed by mechanical thrombectomy of the right MCA clot using solitaire device with good recanalization. Patient did clinically well and was following commands. He was  extubated and did well. He had mild left facial droop and significant left arm weakness but he was able to move his left leg against gravity. He was seen by physical occupational speech therapy and felt to be a good patient for inpatient rehabilitation. He was placed on aspirin and brillintadue to his fresh carotid stent and transferred to rehabilitation in stable condition. He has been home now versus several weeks from rehabilitation. He has finished outpatient therapy. Is able to walk independently with a cane. He has only minimal dragging of the left leg. He's up in improvement in his left hand weakness as well. He still has significant weakness of left grip and hand muscles. He is able to bend his fingers and has a weak grip. He is tolerating aspirin and Brilinta without bruising or bleeding. He has started driving recently. He is not been able to play golf or no to the gym. He has an appointment to see Dr. Corliss Skains later this week. He complains of mild headaches off and on but this also seem to be getting better. He is tolerating Lipitor well without muscle aches and pains. He states his blood pressure is well controlled and today it is 134/79. UPDATE 4/22/2019CM [name], 72 year old male returns for follow-up with a history of hospital admission for stroke in July 2018. The patient underwent emergent proximal right ICA angioplasty with stenting followed by mechanical thrombectomy of the right MCA clot using solitaire device with good recanalization.  He was to stay on Brilinta and aspirin for 6 months then aspirin only.  He has not had further stroke or TIA symptoms, he has not had any bleeding.  He is going to  the gym several times a week and using the treadmill.  He continues with occupational therapy.  He still cannot play golf.  He is driving without difficulty blood pressure in the office today 114/68.  He remains on Lipitor without myalgias.  He returns for reevaluation  REVIEW OF SYSTEMS:  Full 14 system review of systems performed and notable only for those listed, all others are neg:  Constitutional: neg  Cardiovascular: neg Ear/Nose/Throat: neg  Skin: neg Eyes: neg Respiratory: neg Gastroitestinal: neg  Hematology/Lymphatic: neg  Endocrine: neg Musculoskeletal:neg Allergy/Immunology: neg Neurological: neg Psychiatric: neg Sleep : neg   ALLERGIES: No Known Allergies  HOME MEDICATIONS: Outpatient Medications Prior to Visit  Medication Sig Dispense Refill  . amLODipine (NORVASC) 10 MG tablet TAKE 1 TABLET DAILY 90 tablet 2  . aspirin 81 MG chewable tablet Chew 1 tablet (81 mg total) by mouth daily.    Marland Kitchen atorvastatin (LIPITOR) 40 MG tablet TAKE 1 TABLET (40 MG TOTAL) BY MOUTH DAILY AT 6 PM. 30 tablet 6  . BRILINTA 90 MG TABS tablet TAKE 1 TABLET TWICE A DAY 180 tablet 0  . dorzolamide-timolol (COSOPT) 22.3-6.8 MG/ML ophthalmic solution     . PAZEO 0.7 % SOLN Place 1 drop into both eyes daily.     Marland Kitchen VYZULTA 0.024 % SOLN Apply 1 drop to eye at bedtime.     Marland Kitchen atorvastatin (LIPITOR) 40 MG tablet TAKE 1 TABLET DAILY AT 6 P.M. 90 tablet 0  . timolol (TIMOPTIC) 0.5 % ophthalmic solution Place 1 drop into the right eye daily.      No facility-administered medications prior to visit.     PAST MEDICAL HISTORY: Past Medical History:  Diagnosis Date  . Glaucoma   . Hepatitis C   . Stroke [facility])     PAST SURGICAL HISTORY: Past Surgical History:  Procedure Laterality Date  . IR ANGIO INTRA EXTRACRAN SEL COM CAROTID INNOMINATE UNI L MOD SED  11/25/2016  . IR ANGIO VERTEBRAL SEL SUBCLAVIAN INNOMINATE UNI R MOD SED  11/25/2016  . IR ANGIO VERTEBRAL SEL VERTEBRAL UNI L MOD SED  11/25/2016  . IR INTRAVSC STENT CERV CAROTID W/O EMB-PROT MOD SED INC ANGIO  11/25/2016  . IR PERCUTANEOUS ART THROMBECTOMY/INFUSION INTRACRANIAL INC DIAG ANGIO  11/25/2016  . IR RADIOLOGIST EVAL & MGMT  02/13/2017  . RADIOLOGY WITH ANESTHESIA N/A 11/25/2016   Procedure: RADIOLOGY WITH ANESTHESIA;   Surgeon: Radiologist, Medication, MD;  Location: [facility] OR;  Service: Radiology;  Laterality: N/A;    FAMILY HISTORY: Family History  Problem Relation Age of Onset  . Hypertension Mother   . Hypertension Father     SOCIAL HISTORY: Social History   Socioeconomic History  . Marital status: Married    Spouse name: Not on file  . Number of children: Not on file  . Years of education: Not on file  . Highest education level: Not on file  Occupational History  . Not on file  Social Needs  . Financial resource strain: Not on file  . Food insecurity:    Worry: Not on file    Inability: Not on file  . Transportation needs:    Medical: Not on file    Non-medical: Not on file  Tobacco Use  . Smoking status: Former Smoker    Packs/day: 0.50    Years: 15.00    Pack years: 7.50    Last attempt to quit: 1990    Years since quitting: 29.3  . Smokeless tobacco: Never Used  Substance  and Sexual Activity  . Alcohol use: No    Alcohol/week: 0.0 oz  . Drug use: No  . Sexual activity: Yes    Partners: Female    Birth control/protection: None  Lifestyle  . Physical activity:    Days per week: Not on file    Minutes per session: Not on file  . Stress: Not on file  Relationships  . Social connections:    Talks on phone: Not on file    Gets together: Not on file    Attends religious service: Not on file    Active member of club or organization: Not on file    Attends meetings of clubs or organizations: Not on file    Relationship status: Not on file  . Intimate partner violence:    Fear of current or ex partner: Not on file    Emotionally abused: Not on file    Physically abused: Not on file    Forced sexual activity: Not on file  Other Topics Concern  . Not on file  Social History Narrative  . Not on file     PHYSICAL EXAM  Vitals:   08/25/17 1314  BP: 114/68  Pulse: (!) 57  Weight: 197 lb 9.6 oz (89.6 kg)  Height: 5\' 10"  (1.778 m)   Body mass index is 28.35  kg/m.  Generalized: Well developed, in no acute distress  Head: normocephalic and atraumatic,. Oropharynx benign  Neck: Supple, no carotid bruits  Cardiac: Regular rate rhythm, no murmur  Musculoskeletal: No deformity   Neurological examination   Mentation: Alert oriented to time, place, history taking. Attention span and concentration appropriate. Recent and remote memory intact.  Follows all commands speech and language fluent.   Cranial nerve II-XII: Fundoscopic exam reveals sharp disc margins.Pupils were equal round reactive to light extraocular movements were full, visual field were full on confrontational test.  Mild left lower facial weakness . hearing was intact to finger rubbing bilaterally. Uvula tongue midline. head turning and shoulder shrug were normal and symmetric.Tongue protrusion into cheek strength was normal. Motor: normal bulk and tone, full strength in the BUE, BLE, except mild left hand and grip weakness with diminished fine finger movements on the left  Sensory: normal and symmetric to light touch, pinprick, and  Vibration, in the upper and lower extremities Coordination: finger-nose-finger, heel-to-shin bilaterally, no dysmetria Reflexes: 1+ upper and lower and symmetric, plantar responses were flexor bilaterally. Gait and Station: Rising up from seated position without assistance, normal stance,  moderate stride, good arm swing, smooth turning, able to perform tiptoe, and heel walking without difficulty. Tandem gait is steady  DIAGNOSTIC DATA (LABS, IMAGING, TESTING) - I reviewed patient records, labs, notes, testing and imaging myself where available.  Lab Results  Component Value Date   WBC 3.0 (L) 06/20/2017   HGB 14.8 06/20/2017   HCT 44.6 06/20/2017   MCV 92.1 06/20/2017   PLT 202 06/20/2017      Component Value Date/Time   NA 140 05/28/2017 1506   K 4.0 05/28/2017 1506   CL 104 05/28/2017 1506   CO2 25 05/28/2017 1506   GLUCOSE 81 05/28/2017 1506    BUN 14 05/28/2017 1506   CREATININE 0.91 05/28/2017 1506   CALCIUM 9.4 05/28/2017 1506   PROT 7.6 05/28/2017 1506   ALBUMIN 4.7 05/28/2017 1506   AST 21 05/28/2017 1506   ALT 32 05/28/2017 1506   ALKPHOS 68 05/28/2017 1506   BILITOT 0.9 05/28/2017 1506   GFRNONAA >60 12/19/2016  0406   GFRAA >60 12/19/2016 0406   Lab Results  Component Value Date   CHOL 125 05/28/2017   HDL 68.00 05/28/2017   LDLCALC 49 05/28/2017   TRIG 41.0 05/28/2017   CHOLHDL 2 05/28/2017   Lab Results  Component Value Date   HGBA1C 5.4 11/26/2016   Lab Results  Component Value Date   VITAMINB12 271 06/20/2017       ASSESSMENT AND PLAN 38 year African-American male with embolic right MCA infarct in July 2018 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 Mild residual left hemiparesis. Vascular risk factors of hypertension, hyperlipidemia and carotid dissection.    PLAN:Stressed the importance of management of risk factors to prevent further stroke Continue aspirin  for secondary stroke prevention may stop Brilinta from Dr. Ronnald Ramp previous note after 6 months post carotid stenting procedure Maintain strict control of hypertension with blood pressure goal below 130/90, today's reading 114/68 continue antihypertensive medications Control of diabetes with hemoglobin A1c below 6.5 followed by primary care Cholesterol with LDL cholesterol less than 70, followed by primary care,   continue statin drug Lipitor  Exercise by walking, continue occupational therapy rehab for left hand,   eat healthy diet with whole grains,  fresh fruits and vegetables Follow-up with primary care for stroke risk factor modification, maintain blood pressure goal less than 130 systolic, diabetes with A1c below 7, lipids with LDL below 70 F/U 6 months, if stable will discharge I spent 25 minutes in total face to face time with the  patient more than 50% of which was spent counseling and coordination of care, reviewing test results reviewing medications and discussing and reviewing the diagnosis of stroke and management of risk factors and further treatment options. , Cline Crock, [facility], APRN  [facility] [address] [place], [place] [address] [phone]

## 2017-08-25 ENCOUNTER — Encounter: Payer: Self-pay | Admitting: Occupational Therapy

## 2017-08-25 ENCOUNTER — Ambulatory Visit: Payer: Medicare Other | Admitting: Occupational Therapy

## 2017-08-25 ENCOUNTER — Encounter: Payer: Self-pay | Admitting: Nurse Practitioner

## 2017-08-25 ENCOUNTER — Ambulatory Visit (INDEPENDENT_AMBULATORY_CARE_PROVIDER_SITE_OTHER): Payer: Medicare Other | Admitting: Nurse Practitioner

## 2017-08-25 VITALS — BP 114/68 | HR 57 | Ht 70.0 in | Wt 197.6 lb

## 2017-08-25 DIAGNOSIS — I639 Cerebral infarction, unspecified: Secondary | ICD-10-CM | POA: Diagnosis not present

## 2017-08-25 DIAGNOSIS — R278 Other lack of coordination: Secondary | ICD-10-CM

## 2017-08-25 DIAGNOSIS — E7800 Pure hypercholesterolemia, unspecified: Secondary | ICD-10-CM

## 2017-08-25 DIAGNOSIS — M6281 Muscle weakness (generalized): Secondary | ICD-10-CM

## 2017-08-25 DIAGNOSIS — R2681 Unsteadiness on feet: Secondary | ICD-10-CM | POA: Diagnosis not present

## 2017-08-25 DIAGNOSIS — I10 Essential (primary) hypertension: Secondary | ICD-10-CM | POA: Diagnosis not present

## 2017-08-25 DIAGNOSIS — G8194 Hemiplegia, unspecified affecting left nondominant side: Secondary | ICD-10-CM

## 2017-08-25 NOTE — Therapy (Signed)
[facility] MAIN [facility] SERVICES [address] [place], [place], [address] Phone: [phone]   Fax:  [phone]  Occupational Therapy Treatment  Patient Details  Name: Jason Nolan MRN: [id number] Date of Birth: 15-May-1945 Referring Provider: [name]   Encounter Date: 08/25/2017  OT End of Session - 08/25/17 1156    Visit Number  42    Number of Visits  48    Date for OT Re-Evaluation  09/29/17    Authorization Type  Visit 1 of 10 for progress report period starting 08/25/2017    OT Start Time  1150    OT Stop Time  1230    OT Time Calculation (min)  40 min    Activity Tolerance  Patient tolerated treatment well    Behavior During Therapy  [facility] for tasks assessed/performed       Past Medical History:  Diagnosis Date  . Glaucoma   . Hepatitis C   . Stroke [facility])     Past Surgical History:  Procedure Laterality Date  . IR ANGIO INTRA EXTRACRAN SEL COM CAROTID INNOMINATE UNI L MOD SED  11/25/2016  . IR ANGIO VERTEBRAL SEL SUBCLAVIAN INNOMINATE UNI R MOD SED  11/25/2016  . IR ANGIO VERTEBRAL SEL VERTEBRAL UNI L MOD SED  11/25/2016  . IR INTRAVSC STENT CERV CAROTID W/O EMB-PROT MOD SED INC ANGIO  11/25/2016  . IR PERCUTANEOUS ART THROMBECTOMY/INFUSION INTRACRANIAL INC DIAG ANGIO  11/25/2016  . IR RADIOLOGIST EVAL & MGMT  02/13/2017  . RADIOLOGY WITH ANESTHESIA N/A 11/25/2016   Procedure: RADIOLOGY WITH ANESTHESIA;  Surgeon: Radiologist, Medication, MD;  Location: [facility] OR;  Service: Radiology;  Laterality: N/A;    There were no vitals filed for this visit.  OT TREATMENT    Neuro muscular re-education:  Pt. worked on grasping 1" resistive cubes alternating thumb opposition to the tip of the 2nd through 5th digits while the board is placed at a vertical angle. Pt. worked on pressing the cubes back into place while alternating isolated 2nd through 5th digit extension. Pt. Worked on grasping coins from a tabletop surface, placing them into  a resistive container, and pushing them through the slot while isolating his 2nd digit. Pt. Worked on storing the coins in his palm. Pt. Was unable to move the coins from his palm to the tip of his 2nd digit, and thumb.                         OT Education - 08/25/17 1155    Education provided  Yes    Education Details  UE strenghening, Coordination    Person(s) Educated  Patient    Methods  Explanation;Demonstration;Verbal cues    Comprehension  Verbalized understanding;Returned demonstration;Verbal cues required          OT Long Term Goals - 08/20/17 1239      OT LONG TERM GOAL #1   Title  Pt. will improve edema by 2 cm through MPs in preparation for ROM.    Baseline  08/20/2017: Edema, and ROM in the left hand have improved.    Time  12    Period  Weeks    Status  On-going    Target Date  09/29/17      OT LONG TERM GOAL #3   Title  Pt. left hand grip with  increase by 5# in preparation for holding a cup.    Baseline  08/10/2017: Pt. conitnues to have  difficulty holding a cup.     Time  12    Period  Weeks    Status  On-going    Target Date  09/29/17      OT LONG TERM GOAL #4   Title  Pt. will increased right shoulder strength by 1 mm grade to assist with ADLs.    Baseline  08/20/2017: Pt. is progressing, however LUE strength is limited.    Time  12    Period  Weeks    Status  On-going    Target Date  09/29/17      OT LONG TERM GOAL #6   Title  Pt. will improve left hand coordination skills to be able to button clothing.    Baseline  08/20/2017: pt. continues to present with limited Left hand [facility] skills, and has difficulty buttoning.    Time  12    Period  Weeks    Status  On-going    Target Date  09/29/17      OT LONG TERM GOAL #7   Title  Pt. will independently demonstrate visual compensatory strategies duirng ADLs, and IADLs.    Baseline  Glaucoma    Time  12    Period  Weeks    Status  On-going    Target Date  09/29/17      OT LONG TERM  GOAL #8   Title  Pt. will increase Left pinch strength to be able to grasp and hold items during ADL tasks.    Baseline  08/20/2017: Pt. has difficulty with hand position with compensation proximally when grasping for objects with the left hand.    Time  12    Status  On-going    Target Date  09/29/17      OT LONG TERM GOAL  #9   Baseline  Pt. will write one sentence with 75% legibility and efficiently while maintaing grasp on pen 100% of the time.    Time  12    Period  Weeks    Status  On-going    Target Date  09/29/17      OT LONG TERM GOAL  #10   TITLE  Pt. will independently use his left hand to grasp and efficiently accomodate to the varying weights of ADL objects during self care tasks.     Baseline  08/20/2017: Pt. has difficulty grasping and adjusting hand to the varying weights of objects.    Time  12    Period  Weeks    Status  On-going    Target Date  09/29/17            Plan - 08/25/17 1200    Clinical Impression Statement  Pt. plans to go to [place] this week for the [employer]. Pt. continues to make progress overall, and is improving with LUE hand function, and is isolating the digits more during tasks. Pt. continues to present with limited translatory movements of the hand. Pt. conitnue sto work on improving UE functioning for improved ADLs, and IADL.    Occupational performance deficits (Please refer to evaluation for details):  ADL's;IADL's    Rehab Potential  Excellent    OT Frequency  2x / week    OT Duration  12 weeks    OT Treatment/Interventions  Self-care/ADL training;Therapeutic exercise;Neuromuscular education;Patient/family education;Energy conservation;Therapeutic activities;DME and/or AE instruction    Clinical Decision Making  Multiple treatment options, significant modification of task necessary    Consulted and Agree with Plan of Care  Patient       Patient will benefit from skilled therapeutic intervention in order to improve the following  deficits and impairments:  Abnormal gait, Pain, Decreased strength, Decreased range of motion, Impaired UE functional use, Decreased coordination, Decreased knowledge of use of DME, Decreased activity tolerance, Difficulty walking, Decreased balance, Other (comment)  Visit Diagnosis: Muscle weakness (generalized)  Other lack of coordination    Problem List Patient Active Problem List   Diagnosis Date Noted  . Low vitamin B12 level 06/27/2017  . 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 Pure hypercholesterolemia 05/28/2017  . Erectile dysfunction 05/28/2017  . Left hemiparesis (HCC)   . Essential hypertension 11/29/2016  . 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 Leukopenia 03/31/2016  . Hepatitis C 07/22/2013    Olegario MessierElaine Toussaint Golson 08/25/2017, 12:26 PM  [facility] [address] [place], [place], [id number] Phone: [phone]   Fax:  [phone]  Name: Terrilee CroakJames Oxendine MRN: [id number] Date of Birth: [birth year]

## 2017-08-25 NOTE — Patient Instructions (Addendum)
Stressed the importance of management of risk factors to prevent further stroke Continue aspirin  for secondary stroke prevention, may stop Brilinta within 6 months after  carotid stent. Maintain strict control of hypertension with blood pressure goal below 130/90, today's reading 114/68 continue antihypertensive medications Control of diabetes with hemoglobin A1c below 6.5 followed by primary care Cholesterol with LDL cholesterol less than 70, followed by primary care,   continue statin drug Lipitor  Exercise by walking, continue occupational therapy rehab,   eat healthy diet with whole grains,  fresh fruits and vegetables Follow-up with primary care for stroke risk factor modification, maintain blood pressure goal less than 130 systolic, diabetes with A1c below 7, lipids with LDL below 70 F/U 6 months

## 2017-08-26 NOTE — Progress Notes (Signed)
I agree with the above plan 

## 2017-08-27 ENCOUNTER — Encounter: Payer: Medicare Other | Admitting: Occupational Therapy

## 2017-08-31 ENCOUNTER — Other Ambulatory Visit: Payer: Self-pay | Admitting: Internal Medicine

## 2017-08-31 DIAGNOSIS — I639 Cerebral infarction, unspecified: Secondary | ICD-10-CM

## 2017-09-01 ENCOUNTER — Encounter: Payer: Medicare Other | Admitting: Occupational Therapy

## 2017-09-03 ENCOUNTER — Encounter: Payer: Medicare Other | Admitting: Occupational Therapy

## 2017-09-09 ENCOUNTER — Encounter: Payer: Medicare Other | Attending: Physical Medicine & Rehabilitation

## 2017-09-09 ENCOUNTER — Ambulatory Visit (HOSPITAL_BASED_OUTPATIENT_CLINIC_OR_DEPARTMENT_OTHER): Payer: Medicare Other | Admitting: Physical Medicine & Rehabilitation

## 2017-09-09 ENCOUNTER — Other Ambulatory Visit: Payer: Self-pay

## 2017-09-09 ENCOUNTER — Encounter: Payer: Self-pay | Admitting: Physical Medicine & Rehabilitation

## 2017-09-09 VITALS — BP 140/82 | HR 53 | Ht 70.0 in | Wt 198.6 lb

## 2017-09-09 DIAGNOSIS — I639 Cerebral infarction, unspecified: Secondary | ICD-10-CM

## 2017-09-09 DIAGNOSIS — I69322 Dysarthria following cerebral infarction: Secondary | ICD-10-CM | POA: Diagnosis not present

## 2017-09-09 DIAGNOSIS — I10 Essential (primary) hypertension: Secondary | ICD-10-CM | POA: Diagnosis not present

## 2017-09-09 DIAGNOSIS — R600 Localized edema: Secondary | ICD-10-CM | POA: Diagnosis not present

## 2017-09-09 DIAGNOSIS — I69352 Hemiplegia and hemiparesis following cerebral infarction affecting left dominant side: Secondary | ICD-10-CM | POA: Insufficient documentation

## 2017-09-09 DIAGNOSIS — Z87891 Personal history of nicotine dependence: Secondary | ICD-10-CM | POA: Insufficient documentation

## 2017-09-09 DIAGNOSIS — G8194 Hemiplegia, unspecified affecting left nondominant side: Secondary | ICD-10-CM | POA: Diagnosis not present

## 2017-09-09 DIAGNOSIS — H409 Unspecified glaucoma: Secondary | ICD-10-CM | POA: Diagnosis not present

## 2017-09-09 NOTE — Progress Notes (Signed)
Subjective:    Patient ID: Jason Nolan, male    DOB: 1945/09/18, 72 y.o.   MRN: [id number] 72 y.o. left handed male with history of Hep C-treated with Harvoni, glaucoma who was admitted via [facility] on 7/23 with left sided weakness, facial droop and slurred speech due to early changes of R-MCA territory infarct.  He underwent CTA brain showed short segment near occlusion of proximal R-ICA with associated intraluminal thrombus --likely due to dissection and emergent large vessel occlusion with right M2 occlusion and evolving right frontal lobe infarct.  He underwent cerebral angio with  complete revascularization of occluded superior division of R-MCA and near complete revascularization of proximal R-ICA with stent assisted angioplasty.  Stroke felt to be embolic in setting of ruptured plaque R-ICA and he was placed on ASA and brillinta due to stent.  Patient with resultant left sided weakness, expressive deficits with aphonia and dysphagia  HPI Had a good trip to [place]  Left handed using nail clipper with left hand over the last month  Has been able to tie shoes with both hands  Uses Gripper and therabands for exercise as well as attending OT once a week  Mod I with all self care, ambulates without device and drives  Pain Inventory Average Pain 0 Pain Right Now 0 My pain is no pain  In the last 24 hours, has pain interfered with the following? General activity 0 Relation with others 0 Enjoyment of life 0 What TIME of day is your pain at its worst? no pain Sleep (in general) Fair  Pain is worse with: no pain Pain improves with: no pain Relief from Meds: no pain  Mobility how many minutes can you walk? 30 ability to climb steps?  yes do you drive?  yes  Function retired Do you have any goals in this area?  no  Neuro/Psych No problems in this area  Prior Studies Any changes since last visit?  no  Physicians involved in your care Any changes since last visit?   no   Family History  Problem Relation Age of Onset  . Hypertension Mother   . Hypertension Father    Social History   Socioeconomic History  . Marital status: Married    Spouse name: Not on file  . Number of children: Not on file  . Years of education: Not on file  . Highest education level: Not on file  Occupational History  . Not on file  Social Needs  . Financial resource strain: Not on file  . Food insecurity:    Worry: Not on file    Inability: Not on file  . Transportation needs:    Medical: Not on file    Non-medical: Not on file  Tobacco Use  . Smoking status: Former Smoker    Packs/day: 0.50    Years: 15.00    Pack years: 7.50    Last attempt to quit: 1990    Years since quitting: 29.3  . Smokeless tobacco: Never Used  Substance and Sexual Activity  . Alcohol use: No    Alcohol/week: 0.0 oz  . Drug use: No  . Sexual activity: Yes    Partners: Female    Birth control/protection: None  Lifestyle  . Physical activity:    Days per week: Not on file    Minutes per session: Not on file  . Stress: Not on file  Relationships  . Social connections:    Talks on phone: Not on file  Gets together: Not on file    Attends religious service: Not on file    Active member of club or organization: Not on file    Attends meetings of clubs or organizations: Not on file    Relationship status: Not on file  Other Topics Concern  . Not on file  Social History Narrative  . Not on file   Past Surgical History:  Procedure Laterality Date  . IR ANGIO INTRA EXTRACRAN SEL COM CAROTID INNOMINATE UNI L MOD SED  11/25/2016  . IR ANGIO VERTEBRAL SEL SUBCLAVIAN INNOMINATE UNI R MOD SED  11/25/2016  . IR ANGIO VERTEBRAL SEL VERTEBRAL UNI L MOD SED  11/25/2016  . IR INTRAVSC STENT CERV CAROTID W/O EMB-PROT MOD SED INC ANGIO  11/25/2016  . IR PERCUTANEOUS ART THROMBECTOMY/INFUSION INTRACRANIAL INC DIAG ANGIO  11/25/2016  . IR RADIOLOGIST EVAL & MGMT  02/13/2017  . RADIOLOGY WITH  ANESTHESIA N/A 11/25/2016   Procedure: RADIOLOGY WITH ANESTHESIA;  Surgeon: Radiologist, Medication, MD;  Location: [facility] OR;  Service: Radiology;  Laterality: N/A;   Past Medical History:  Diagnosis Date  . Glaucoma   . Hepatitis C   . Stroke (HCC)    BP 140/82   Pulse (!) 53   Ht  (1.778 m)   Wt 198 lb 9.6 oz (90.1 kg)   SpO2 98%   BMI 28.50 kg/m   Opioid Risk Score:   Fall Risk Score:  `1  Depression screen PHQ 2/9  Depression screen [facility] 2/9 09/09/2017 05/28/2017 04/08/2017 12/31/2016 01/26/2016 10/27/2014 07/22/2013  Decreased Interest 0 0 0 0 0 0 0  Down, Depressed, Hopeless 0 0 0 0 0 0 0  PHQ - 2 Score 0 0 0 0 0 0 0  Altered sleeping - - - 0 - - -  Tired, decreased energy - - - 0 - - -  Change in appetite - - - 0 - - -  Feeling bad or failure about yourself  - - - 0 - - -  Trouble concentrating - - - 0 - - -  Moving slowly or fidgety/restless - - - 0 - - -  Suicidal thoughts - - - 0 - - -  PHQ-9 Score - - - 0 - - -  Difficult doing work/chores - - - Not difficult at all - - -    Review of Systems  Constitutional: Negative.   HENT: Negative.   Eyes: Negative.   Respiratory: Negative.   Cardiovascular: Negative.   Gastrointestinal: Negative.   Endocrine: Negative.   Genitourinary: Negative.   Musculoskeletal: Negative.   Skin: Negative.   Allergic/Immunologic: Negative.   Neurological: Negative.   Hematological: Negative.   Psychiatric/Behavioral: Negative.   All other systems reviewed and are negative.      Objective:   Physical Exam  Constitutional: He is oriented to person, place, and time. He appears well-developed and well-nourished.  HENT:  Head: Normocephalic and atraumatic.  Eyes: Pupils are equal, round, and reactive to light. EOM are normal.  Neurological: He is alert and oriented to person, place, and time.  Skin: Skin is warm and dry.  Nursing note and vitals reviewed.  Motor strength is 5/5 in the right deltoid, bicep, tricep, grip, hip  flexor, knee extensor, ankle dorsiflexor 4/5 in the left deltoid, bicep, tricep, grip, hip flexor, knee extensor, ankle dorsiflexor Decreased finger to thumb opposition on the left side.        Assessment & Plan:  1.  Left hemiparesis secondary  to right CVA, history of right ICA ruptured plaque.  He has undergone stenting.  He is now off Brilinta secondary to greater than 6 months post and is on aspirin. We discussed timeline of recovery after stroke he may have some additional improvements in strength in the left upper extremity but we would not expect full recovery.  He has about 4 OT sessions left which is about appropriate as long as he continues his home exercise program  Follow-up with neurology Follow-up with primary care for blood pressure as well as hyperlipidemia management he continues on Norvasc 10 mg/day as well as Lipitor 40 mg/day Follow-up physical medicine rehab 3 months

## 2017-09-10 ENCOUNTER — Ambulatory Visit: Payer: Medicare Other | Attending: Internal Medicine | Admitting: Occupational Therapy

## 2017-09-10 ENCOUNTER — Encounter: Payer: Self-pay | Admitting: Occupational Therapy

## 2017-09-10 DIAGNOSIS — M6281 Muscle weakness (generalized): Secondary | ICD-10-CM | POA: Insufficient documentation

## 2017-09-10 DIAGNOSIS — R278 Other lack of coordination: Secondary | ICD-10-CM | POA: Insufficient documentation

## 2017-09-10 NOTE — Therapy (Signed)
[place] [facility] MAIN [facility] SERVICES [address] [place], [place], [address] Phone: [phone]   Fax:  [phone]  Occupational Therapy Treatment  Patient Details  Name: Jason Nolan MRN: [id number] Date of Birth: 10-28-1945 Referring Provider: [name]   Encounter Date: 09/10/2017  OT End of Session - 09/10/17 1317    Visit Number  43    Number of Visits  48    Date for OT Re-Evaluation  09/29/17    Authorization Type  Visit 2 of 10 for progress report period starting 08/25/2017    OT Start Time  1300    OT Stop Time  1345    OT Time Calculation (min)  45 min    Activity Tolerance  Patient tolerated treatment well    Behavior During Therapy  [facility] for tasks assessed/performed       Past Medical History:  Diagnosis Date  . Glaucoma   . Hepatitis C   . Stroke [facility])     Past Surgical History:  Procedure Laterality Date  . IR ANGIO INTRA EXTRACRAN SEL COM CAROTID INNOMINATE UNI L MOD SED  11/25/2016  . IR ANGIO VERTEBRAL SEL SUBCLAVIAN INNOMINATE UNI R MOD SED  11/25/2016  . IR ANGIO VERTEBRAL SEL VERTEBRAL UNI L MOD SED  11/25/2016  . IR INTRAVSC STENT CERV CAROTID W/O EMB-PROT MOD SED INC ANGIO  11/25/2016  . IR PERCUTANEOUS ART THROMBECTOMY/INFUSION INTRACRANIAL INC DIAG ANGIO  11/25/2016  . IR RADIOLOGIST EVAL & MGMT  02/13/2017  . RADIOLOGY WITH ANESTHESIA N/A 11/25/2016   Procedure: RADIOLOGY WITH ANESTHESIA;  Surgeon: Radiologist, Medication, MD;  Location: [facility] OR;  Service: Radiology;  Laterality: N/A;    There were no vitals filed for this visit.  Subjective Assessment - 09/10/17 1315    Subjective   Pt. returned from [place].    Patient is accompained by:  Family member    Pertinent History  Pt. is a 72 y.o. 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 Pt. was transferred to [facility]. He underwent CTA brain showed short segment near occlusion of proximal R-ICA with associated intraluminal thrombus --likely due to  dissection and emergent large vessel occlusion with right M2 occlusion and evolving right frontal lobe infarct.  He underwent cerebral angio with  complete revascularization of occluded superior division of R-MCA and near complete revascularization of proximal R-ICA with stent assisted angioplasty.  Stroke felt to be embolic in setting of ruptured plaque R-ICA and he was placed on ASA and brillinta due to stent. Pt. was transferred to inpatient rehab for several weeks, had home health therapy upon discharge, and now is ready for outpatient therapy services.    Limitations  Dominant LUE functioning, left hand edema, distal ROM, coordination, and overal strength.    Patient Stated Goals  To regain the use of his LUE.    Currently in Pain?  No/denies      OT TREATMENT    Neuro muscular re-education:  Pt. Worked on grasping coins from a tabletop surface, placing them into a resistive container, and pushing them through the slot while isolating his 2nd digit. A resistive mat was placed under coins to aide in manipulating the coins and prevent sliding when picking them up.  Therapeutic Exercise:  Pt. Worked on the [employer] for 8 min. With constant monitoring of the BUEs. Pt. Worked on changing, and alternating forward reverse position every 2 min. Rest breaks were required. Pt. performed gross gripping with grip strengthener. Pt.  worked on sustaining grip while grasping pegs and reaching at various heights. Gripper was placed in the 4th resistive slot with the white resistive spring. Pt. Worked on pinch strengthening in the left hand for lateral, and 3pt. pinch using red, green, and blue resistive clips. Pt. worked on placing the clips at various vertical and horizontal angles. Tactile and verbal cues were required for eliciting the desired movement.                         OT Education - 09/10/17 1317    Education provided  Yes    Education Details  UE strength, and coordination     Person(s) Educated  Patient    Methods  Explanation;Demonstration;Verbal cues    Comprehension  Verbalized understanding;Returned demonstration;Verbal cues required          OT Long Term Goals - 08/20/17 1239      OT LONG TERM GOAL #1   Title  Pt. will improve edema by 2 cm through MPs in preparation for ROM.    Baseline  08/20/2017: Edema, and ROM in the left hand have improved.    Time  12    Period  Weeks    Status  On-going    Target Date  09/29/17      OT LONG TERM GOAL #3   Title  Pt. left hand grip with  increase by 5# in preparation for holding a cup.    Baseline  08/10/2017: Pt. conitnues to have difficulty holding a cup.     Time  12    Period  Weeks    Status  On-going    Target Date  09/29/17      OT LONG TERM GOAL #4   Title  Pt. will increased right shoulder strength by 1 mm grade to assist with ADLs.    Baseline  08/20/2017: Pt. is progressing, however LUE strength is limited.    Time  12    Period  Weeks    Status  On-going    Target Date  09/29/17      OT LONG TERM GOAL #6   Title  Pt. will improve left hand coordination skills to be able to button clothing.    Baseline  08/20/2017: pt. continues to present with limited Left hand [facility] skills, and has difficulty buttoning.    Time  12    Period  Weeks    Status  On-going    Target Date  09/29/17      OT LONG TERM GOAL #7   Title  Pt. will independently demonstrate visual compensatory strategies duirng ADLs, and IADLs.    Baseline  Glaucoma    Time  12    Period  Weeks    Status  On-going    Target Date  09/29/17      OT LONG TERM GOAL #8   Title  Pt. will increase Left pinch strength to be able to grasp and hold items during ADL tasks.    Baseline  08/20/2017: Pt. has difficulty with hand position with compensation proximally when grasping for objects with the left hand.    Time  12    Status  On-going    Target Date  09/29/17      OT LONG TERM GOAL  #9   Baseline  Pt. will write one sentence with  75% legibility and efficiently while maintaing grasp on pen 100% of the time.    Time  12    Period  Weeks    Status  On-going    Target Date  09/29/17      OT LONG TERM GOAL  #10   TITLE  Pt. will independently use his left hand to grasp and efficiently accomodate to the varying weights of ADL objects during self care tasks.     Baseline  08/20/2017: Pt. has difficulty grasping and adjusting hand to the varying weights of objects.    Time  12    Period  Weeks    Status  On-going    Target Date  09/29/17            Plan - 09/10/17 1318    Clinical Impression Statement  Pt. returned from his trip from [place]. Pt.went out to the golf course this morning. Pt. reports he was able to begin hitting the ball.  Pt. had a follow-up appointment with Dr. Wynn Banker. Pt. continues to work on improving LUE strength, grip strength, pinch strength, hand function, and coordination skills.    Occupational performance deficits (Please refer to evaluation for details):  ADL's;IADL's    Rehab Potential  Excellent    OT Frequency  2x / week    OT Duration  12 weeks    OT Treatment/Interventions  Self-care/ADL training;Therapeutic exercise;Neuromuscular education;Patient/family education;Energy conservation;Therapeutic activities;DME and/or AE instruction    Clinical Decision Making  Multiple treatment options, significant modification of task necessary    Consulted and Agree with Plan of Care  Patient       Patient will benefit from skilled therapeutic intervention in order to improve the following deficits and impairments:  Abnormal gait, Pain, Decreased strength, Decreased range of motion, Impaired UE functional use, Decreased coordination, Decreased knowledge of use of DME, Decreased activity tolerance, Difficulty walking, Decreased balance, Other (comment)  Visit Diagnosis: Muscle weakness (generalized)  Other lack of coordination    Problem List Patient Active Problem List   Diagnosis Date  Noted  . Low vitamin B12 level 06/27/2017  . 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 Pure hypercholesterolemia 05/28/2017  . Erectile dysfunction 05/28/2017  . Left hemiparesis (HCC)   . Essential hypertension 11/29/2016  . 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 Leukopenia 03/31/2016  . Hepatitis C 07/22/2013    Olegario Messier, MS, OTR/L 09/10/2017, 1:34 PM  [place] [facility] MAIN [facility] SERVICES [address] [place], [place], [address] Phone: [phone]   Fax:  [phone]  Name: Jason Nolan MRN: [id number] Date of Birth: 1945/08/12

## 2017-09-16 ENCOUNTER — Encounter: Payer: Self-pay | Admitting: Occupational Therapy

## 2017-09-16 ENCOUNTER — Ambulatory Visit: Payer: Medicare Other | Admitting: Occupational Therapy

## 2017-09-16 DIAGNOSIS — M6281 Muscle weakness (generalized): Secondary | ICD-10-CM

## 2017-09-16 DIAGNOSIS — R278 Other lack of coordination: Secondary | ICD-10-CM | POA: Diagnosis not present

## 2017-09-16 NOTE — Therapy (Signed)
[place] [facility] MAIN [facility] SERVICES [address] [place], [place], [address] Phone: [phone]   Fax:  [phone]  Occupational Therapy Treatment  Patient Details  Name: Jason Nolan MRN: [id number] Date of Birth: 1945-10-07 Referring Provider: [name]   Encounter Date: 09/16/2017  OT End of Session - 09/16/17 1131    Visit Number  44    Number of Visits  48    Date for OT Re-Evaluation  09/29/17    Authorization Type  Visit 3 of 10 for progress report period starting 08/25/2017    OT Start Time  1115    OT Stop Time  1200    OT Time Calculation (min)  45 min    Activity Tolerance  Patient tolerated treatment well    Behavior During Therapy  [facility] for tasks assessed/performed       Past Medical History:  Diagnosis Date  . Glaucoma   . Hepatitis C   . Stroke [facility])     Past Surgical History:  Procedure Laterality Date  . IR ANGIO INTRA EXTRACRAN SEL COM CAROTID INNOMINATE UNI L MOD SED  11/25/2016  . IR ANGIO VERTEBRAL SEL SUBCLAVIAN INNOMINATE UNI R MOD SED  11/25/2016  . IR ANGIO VERTEBRAL SEL VERTEBRAL UNI L MOD SED  11/25/2016  . IR INTRAVSC STENT CERV CAROTID W/O EMB-PROT MOD SED INC ANGIO  11/25/2016  . IR PERCUTANEOUS ART THROMBECTOMY/INFUSION INTRACRANIAL INC DIAG ANGIO  11/25/2016  . IR RADIOLOGIST EVAL & MGMT  02/13/2017  . RADIOLOGY WITH ANESTHESIA N/A 11/25/2016   Procedure: RADIOLOGY WITH ANESTHESIA;  Surgeon: Radiologist, Medication, MD;  Location: [facility] OR;  Service: Radiology;  Laterality: N/A;    There were no vitals filed for this visit.  Subjective Assessment - 09/16/17 1126    Subjective   Pt. is planning a trip to [place] next week.    Patient is accompained by:  Family member    Pertinent History  Pt. is a 72 y.o. 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 Pt. was transferred to [facility]. He underwent CTA brain showed short segment near occlusion of proximal R-ICA with associated intraluminal  thrombus --likely due to dissection and emergent large vessel occlusion with right M2 occlusion and evolving right frontal lobe infarct.  He underwent cerebral angio with  complete revascularization of occluded superior division of R-MCA and near complete revascularization of proximal R-ICA with stent assisted angioplasty.  Stroke felt to be embolic in setting of ruptured plaque R-ICA and he was placed on ASA and brillinta due to stent. Pt. was transferred to inpatient rehab for several weeks, had home health therapy upon discharge, and now is ready for outpatient therapy services.    Limitations  Dominant LUE functioning, left hand edema, distal ROM, coordination, and overal strength.    Currently in Pain?  No/denies       OT TREATMENT    Neuro muscular re-education:  Pt. worked on translatory movements grasping 1/2" circular  marbles, and flat marbles and moving them through his left hand for storage, and moving them from his palm to his 2nd. Pt. Is improving with the circular marbles, however was unable to perform the marbles. Pt. worked on tasks to sustain lateral pinch on resistive tweezers while grasping and moving 2" toothpick sticks from a horizontal flat position to a vertical position in order to place it in the holder. Pt. was able to sustain grasp while positioning and extending the wrist/hand in the necessary  alignment needed to place the stick through the top of the holder.                         OT Education - 09/16/17 1130    Education provided  Yes    Education Details  UE strength, and coordination    Person(s) Educated  Patient    Methods  Explanation;Verbal cues    Comprehension  Returned demonstration;Verbal cues required          OT Long Term Goals - 08/20/17 1239      OT LONG TERM GOAL #1   Title  Pt. will improve edema by 2 cm through MPs in preparation for ROM.    Baseline  08/20/2017: Edema, and ROM in the left hand have improved.    Time   12    Period  Weeks    Status  On-going    Target Date  09/29/17      OT LONG TERM GOAL #3   Title  Pt. left hand grip with  increase by 5# in preparation for holding a cup.    Baseline  08/10/2017: Pt. conitnues to have difficulty holding a cup.     Time  12    Period  Weeks    Status  On-going    Target Date  09/29/17      OT LONG TERM GOAL #4   Title  Pt. will increased right shoulder strength by 1 mm grade to assist with ADLs.    Baseline  08/20/2017: Pt. is progressing, however LUE strength is limited.    Time  12    Period  Weeks    Status  On-going    Target Date  09/29/17      OT LONG TERM GOAL #6   Title  Pt. will improve left hand coordination skills to be able to button clothing.    Baseline  08/20/2017: pt. continues to present with limited Left hand [facility] skills, and has difficulty buttoning.    Time  12    Period  Weeks    Status  On-going    Target Date  09/29/17      OT LONG TERM GOAL #7   Title  Pt. will independently demonstrate visual compensatory strategies duirng ADLs, and IADLs.    Baseline  Glaucoma    Time  12    Period  Weeks    Status  On-going    Target Date  09/29/17      OT LONG TERM GOAL #8   Title  Pt. will increase Left pinch strength to be able to grasp and hold items during ADL tasks.    Baseline  08/20/2017: Pt. has difficulty with hand position with compensation proximally when grasping for objects with the left hand.    Time  12    Status  On-going    Target Date  09/29/17      OT LONG TERM GOAL  #9   Baseline  Pt. will write one sentence with 75% legibility and efficiently while maintaing grasp on pen 100% of the time.    Time  12    Period  Weeks    Status  On-going    Target Date  09/29/17      OT LONG TERM GOAL  #10   TITLE  Pt. will independently use his left hand to grasp and efficiently accomodate to the varying weights of ADL objects during self care tasks.  Baseline  08/20/2017: Pt. has difficulty grasping and adjusting  hand to the varying weights of objects.    Time  12    Period  Weeks    Status  On-going    Target Date  09/29/17            Plan - 09/16/17 1132    Clinical Impression Statement  Pt. continues to present with limited UE strength, and coordination skills.  Pt. continues to work on improving translatory movements of the left hand. Pt. was able to grasp and store 1/2" marbles. Pt. was able to initiate using his left hand for translatory movements of the hand moving the objects from the palm to his finger.    Occupational performance deficits (Please refer to evaluation for details):  ADL's;IADL's    Rehab Potential  Excellent    OT Frequency  2x / week    OT Duration  12 weeks    OT Treatment/Interventions  Self-care/ADL training;Therapeutic exercise;Neuromuscular education;Patient/family education;Energy conservation;Therapeutic activities;DME and/or AE instruction    Clinical Decision Making  Multiple treatment options, significant modification of task necessary    Consulted and Agree with Plan of Care  Patient       Patient will benefit from skilled therapeutic intervention in order to improve the following deficits and impairments:  Abnormal gait, Pain, Decreased strength, Decreased range of motion, Impaired UE functional use, Decreased coordination, Decreased knowledge of use of DME, Decreased activity tolerance, Difficulty walking, Decreased balance, Other (comment)  Visit Diagnosis: Muscle weakness (generalized)  Other lack of coordination    Problem List Patient Active Problem List   Diagnosis Date Noted  . Low vitamin B12 level 06/27/2017  . 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 Pure hypercholesterolemia 05/28/2017  . Erectile dysfunction 05/28/2017  . Left hemiparesis (HCC)   . Essential hypertension 11/29/2016  . 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 Leukopenia 03/31/2016  . Hepatitis C 07/22/2013    Olegario Messier, MS, OTR/L 09/16/2017, 11:55 AM  [place] [facility] MAIN [facility] SERVICES [address] [place], [place], [address] Phone: [phone]   Fax:  [phone]  Name: Nayib Remer MRN: [id number] Date of Birth: 06-26-1945

## 2017-09-19 ENCOUNTER — Other Ambulatory Visit: Payer: Self-pay

## 2017-09-19 ENCOUNTER — Ambulatory Visit (INDEPENDENT_AMBULATORY_CARE_PROVIDER_SITE_OTHER): Payer: Medicare Other | Admitting: Family Medicine

## 2017-09-19 ENCOUNTER — Encounter: Payer: Self-pay | Admitting: Family Medicine

## 2017-09-19 VITALS — BP 110/60 | HR 52 | Temp 98.7°F | Ht 70.0 in | Wt 197.5 lb

## 2017-09-19 DIAGNOSIS — J029 Acute pharyngitis, unspecified: Secondary | ICD-10-CM | POA: Diagnosis not present

## 2017-09-19 DIAGNOSIS — R1312 Dysphagia, oropharyngeal phase: Secondary | ICD-10-CM | POA: Diagnosis not present

## 2017-09-19 DIAGNOSIS — Z8673 Personal history of transient ischemic attack (TIA), and cerebral infarction without residual deficits: Secondary | ICD-10-CM

## 2017-09-19 DIAGNOSIS — I639 Cerebral infarction, unspecified: Secondary | ICD-10-CM

## 2017-09-19 DIAGNOSIS — R4781 Slurred speech: Secondary | ICD-10-CM | POA: Insufficient documentation

## 2017-09-19 NOTE — Assessment & Plan Note (Signed)
Pt with occ clearing of throat and food getting stuck..  Not clearly a new change. ST  Will eval for aspiration risk and make recommendation for pt on correct swallowing.

## 2017-09-19 NOTE — Assessment & Plan Note (Signed)
No clear new stroke.Marland Kitchen gradual regression of speech due to mild motor deficit in left face. Refer to speech therapy for re-eal and review of exercises

## 2017-09-19 NOTE — Assessment & Plan Note (Signed)
Right ear irrigated without complication.  ST likely due to allergies.. Start flonase to decrease PND.

## 2017-09-19 NOTE — Progress Notes (Signed)
   Subjective:    Patient ID: Jason Nolan, male    DOB: 1946-02-18, 72 y.o.   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  on Brillinta, ASA, Amlodipine and Atorvastatin.  He reports he has noted slurred speech after CVA last year.. Improved  Some with speech therapy until last month.  He has continued to have droop in left face, may have worsened again. Feels like he has regressed. He has good days and bad days. He thinks he may have gotten more lazy with his speech.  He has not been practicing what he learned at speech therapy.Gradually worsening in last month.   In last week he has also noted soreness in throat. Gargle has not helped.  Pain when swallowing. Mild runny nose.  Post nasal drip. No cough, no SOB. No fever. No ear pain no sinus pain. Occ cookie feels like it is getting stuck in throat.. Not often.  Occ clearing throat after eating something dry. Denies choking sensation.   He has completed PT.. Just doing OT now.   Last saw Neuro 08/25/2017 no changes made.  Review of Systems  Constitutional: Negative for fatigue.  HENT: Negative for ear pain.   Eyes: Negative for pain.  Respiratory: Negative for cough and shortness of breath.   Cardiovascular: Negative for chest pain.  Gastrointestinal: Negative for abdominal pain.  Genitourinary: Negative for dysuria.  Neurological: Positive for facial asymmetry. Negative for light-headedness.       Objective:   Physical Exam  Constitutional: He is oriented to person, place, and time. Vital signs are normal. He appears well-developed and well-nourished.  HENT:  Head: Normocephalic.  Right Ear: Hearing normal.  Left Ear: Hearing normal.  Nose: Nose normal.  Mouth/Throat: Oropharynx is clear and moist and mucous membranes are normal.  Neck: Trachea normal. Carotid bruit is not present. No thyroid mass and no  thyromegaly present.  Cardiovascular: Normal rate, regular rhythm and normal pulses. Exam reveals no gallop, no distant heart sounds and no friction rub.  No murmur heard. No peripheral edema  Pulmonary/Chest: Effort normal and breath sounds normal. No respiratory distress.  Neurological: He is oriented to person, place, and time. A cranial nerve deficit is present.   nml strength throughout except decreased left facial motor  Skin: Skin is warm, dry and intact. No rash noted.  Psychiatric: He has a normal mood and affect. His speech is normal and behavior is normal. Thought content normal.          Assessment & Plan:

## 2017-09-19 NOTE — Patient Instructions (Addendum)
Start nasal flonase 2 sprays per nostril daily.  Please stop at the front desk to set up referral to speech therapy for evaluation.  Restart practicing speech therapy  Call if sore throat not improving as expected.

## 2017-09-22 ENCOUNTER — Other Ambulatory Visit: Payer: Self-pay

## 2017-09-22 ENCOUNTER — Encounter: Payer: Self-pay | Admitting: Occupational Therapy

## 2017-09-22 ENCOUNTER — Ambulatory Visit: Payer: Medicare Other | Admitting: Occupational Therapy

## 2017-09-22 DIAGNOSIS — R278 Other lack of coordination: Secondary | ICD-10-CM

## 2017-09-22 DIAGNOSIS — M6281 Muscle weakness (generalized): Secondary | ICD-10-CM

## 2017-09-22 NOTE — Therapy (Addendum)
[place] MAIN [facility] SERVICES [address] [place], [place], [address] Phone: [phone]   Fax:  [phone]  Occupational Therapy Treatment/Recertification Note  Patient Details  Name: Jason Nolan MRN: [id number] Date of Birth: 1945/05/09 Referring Provider: Jearld Fenton   Encounter Date: 09/22/2017  OT End of Session - 09/22/17 1313    Visit Number  60    Number of Visits  43    Date for OT Re-Evaluation  12/15/17    Authorization Type  Visit 4 of 10 for progress report period starting 08/25/2017    Activity Tolerance  Patient tolerated treatment well    Behavior During Therapy  [facility] for tasks assessed/performed       Past Medical History:  Diagnosis Date  . Glaucoma   . Hepatitis C   . Stroke [facility])     Past Surgical History:  Procedure Laterality Date  . IR ANGIO INTRA EXTRACRAN SEL COM CAROTID INNOMINATE UNI L MOD SED  11/25/2016  . IR ANGIO VERTEBRAL SEL SUBCLAVIAN INNOMINATE UNI R MOD SED  11/25/2016  . IR ANGIO VERTEBRAL SEL VERTEBRAL UNI L MOD SED  11/25/2016  . IR INTRAVSC STENT CERV CAROTID W/O EMB-PROT MOD SED INC ANGIO  11/25/2016  . IR PERCUTANEOUS ART THROMBECTOMY/INFUSION INTRACRANIAL INC DIAG ANGIO  11/25/2016  . IR RADIOLOGIST EVAL & MGMT  02/13/2017  . RADIOLOGY WITH ANESTHESIA N/A 11/25/2016   Procedure: RADIOLOGY WITH ANESTHESIA;  Surgeon: Radiologist, Medication, MD;  Location: [place];  Service: Radiology;  Laterality: N/A;    There were no vitals filed for this visit.  Subjective Assessment - 09/22/17 1309    Subjective   Pt, is preparing for his trip to [place] this week.    Patient is accompained by:  Family member    Pertinent History  Pt. is a 72 y.o. 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 Pt. was transferred to [facility]. He underwent CTA brain showed short segment near occlusion of proximal R-ICA with associated intraluminal thrombus --likely due to dissection and emergent large vessel  occlusion with right M2 occlusion and evolving right frontal lobe infarct.  He underwent cerebral angio with  complete revascularization of occluded superior division of R-MCA and near complete revascularization of proximal R-ICA with stent assisted angioplasty.  Stroke felt to be embolic in setting of ruptured plaque R-ICA and he was placed on ASA and brillinta due to stent. Pt. was transferred to inpatient rehab for several weeks, had home health therapy upon discharge, and now is ready for outpatient therapy services.    Limitations  Dominant LUE functioning, left hand edema, distal ROM, coordination, and overal strength.    Currently in Pain?  No/denies         [facility] OT Assessment - 09/22/17 0001      Coordination   Left 9 Hole Peg Test  2 min. & 30 sec.      Strength   Overall Strength Comments  LUE strength: 5/5 overall.      Hand Function   Left Hand Grip (lbs)  22    Left Hand Lateral Pinch  12 lbs    Left 3 point pinch  8 lbs      OT TREATMENT    Therapeutic Exercise:  Pt. worked on Retail banker using [employer]  tools with green theraband using the knob turn, cap turn, peg turn, and key turn attachments to target specific components of movements in the hand. Pt. worked with the  knobturn attachments to work to focus on standard gripping,  and weightbearing push. Pt. worked on using the key turn attachment for standard turning, lateral pinch turn. Pt. worked on using the cap turn attachment for standard turning, and lateral grasp. Pt. required verbal cues for movement patterns.  Neuromuscular re-education:  Pt. worked on the development of [facility] skills improving thumb opposition with tactile cues at the thenar, and hypothenar eminences.                     OT Education - 09/22/17 1312    Education provided  Yes    Education Details  LUE strength, and coordination    Person(s) Educated  Patient    Methods  Explanation;Verbal cues    Comprehension   Returned demonstration;Verbal cues required          OT Long Term Goals - 09/22/17 1339      OT LONG TERM GOAL #1   Title  Pt. will improve edema by 2 cm through MPs in preparation for ROM.    Baseline  09/22/2017: Improved    Time  12    Status  Achieved      OT LONG TERM GOAL #3   Title  Pt. left hand grip with  increase by 5# in preparation for opening a jar.    Baseline  09/22/2017:     Time  12    Period  Weeks    Status  On-going    Target Date  12/15/17      OT LONG TERM GOAL #4   Title  Pt. will increased right shoulder strength by 1 mm grade to assist with ADLs.    Baseline  09/22/2017: Met    Time  12    Period  Weeks    Status  Achieved      OT LONG TERM GOAL #6   Title  Pt. will improve left hand coordination skills to be able to button clothing.    Baseline  09/22/2017: Pt. has improved with left hand coordination skills, continues to have difficulty with buttoning.    Time  2    Period  Weeks    Status  On-going    Target Date  12/15/17      OT LONG TERM GOAL #7   Title  Pt. will independently demonstrate visual compensatory strategies duirng ADLs, and IADLs.    Baseline  Glaucoma    Time  12    Period  Weeks    Status  Deferred      OT LONG TERM GOAL #8   Title  Pt. will increase Left pinch strength to be able to grasp and hold items during ADL tasks.    Baseline  09/22/2017: Pt. continues to progress however pinch strength, and ability to grasp, and hold objects.    Time  12    Period  Weeks    Status  On-going    Target Date  12/15/17      OT LONG TERM GOAL  #9   Baseline  Pt. will write one sentence with 75% legibility and efficiently while maintaing grasp on pen 100% of the time.    Period  Weeks    Status  On-going    Target Date  12/15/17      OT LONG TERM GOAL  #10   TITLE  Pt. will independently use his left hand to grasp and efficiently accomodate to the varying weights of ADL objects during self  care tasks.     Baseline  09/22/2017: Pt.  conitnues to have difficulty grasping and adjusting hand to the varying weights of objects.    Time  12    Period  Weeks    Status  On-going    Target Date  12/15/17            Plan - 09/22/17 1314    Clinical Impression Statement  Pt. in now engaging his Left hand more during ADL tasks at home. Pt. is using his left hand to open door locks, doorknobs, and playing golf. Pt. continues to present with difficulty with [facility] tasks, and hand function, thumb opposition during ADL tasks. Pt. continues to work on improving LUE functioning for improved engagement in ADL, and IADL tasks. Pt. goals were reviewed with the pt.    Occupational performance deficits (Please refer to evaluation for details):  ADL's;IADL's    Rehab Potential  Excellent    OT Frequency  2x / week    OT Duration  12 weeks    OT Treatment/Interventions  Self-care/ADL training;Therapeutic exercise;Neuromuscular education;Patient/family education;Energy conservation;Therapeutic activities;DME and/or AE instruction    Clinical Decision Making  Multiple treatment options, significant modification of task necessary    Consulted and Agree with Plan of Care  Patient       Patient will benefit from skilled therapeutic intervention in order to improve the following deficits and impairments:  Abnormal gait, Pain, Decreased strength, Decreased range of motion, Impaired UE functional use, Decreased coordination, Decreased knowledge of use of DME, Decreased activity tolerance, Difficulty walking, Decreased balance, Other (comment)  Visit Diagnosis: Muscle weakness (generalized)  Other lack of coordination    Problem List Patient Active Problem List   Diagnosis Date Noted  . 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 Sore throat 09/19/2017  . Low vitamin B12 level 06/27/2017  . 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 Pure hypercholesterolemia 05/28/2017  . Erectile dysfunction 05/28/2017  . Left hemiparesis  ([place])   . Dysphagia 11/29/2016  . Essential hypertension 11/29/2016  . 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 Leukopenia 03/31/2016  . Hepatitis C 07/22/2013    Harrel Carina, MS, OTR/L 09/22/2017, 3:41 PM  [place] MAIN [facility] SERVICES [address] [place], [place], [address] Phone: [phone]   Fax:  [phone]  Name: Oz Gammel MRN: [id number] Date of Birth: Mar 01, 1946

## 2017-09-22 NOTE — Addendum Note (Signed)
Addended by: Avon Gully on: 09/22/2017 03:54 PM   Modules accepted: Orders

## 2017-09-24 ENCOUNTER — Encounter: Payer: Medicare Other | Admitting: Occupational Therapy

## 2017-10-01 ENCOUNTER — Ambulatory Visit: Payer: Medicare Other | Admitting: Occupational Therapy

## 2017-10-13 ENCOUNTER — Encounter: Payer: Self-pay | Admitting: Occupational Therapy

## 2017-10-13 ENCOUNTER — Encounter: Payer: Self-pay | Admitting: Speech Pathology

## 2017-10-13 ENCOUNTER — Ambulatory Visit: Payer: Medicare Other | Admitting: Speech Pathology

## 2017-10-13 ENCOUNTER — Ambulatory Visit: Payer: Medicare Other | Attending: Internal Medicine | Admitting: Occupational Therapy

## 2017-10-13 ENCOUNTER — Other Ambulatory Visit: Payer: Self-pay

## 2017-10-13 DIAGNOSIS — M6281 Muscle weakness (generalized): Secondary | ICD-10-CM | POA: Diagnosis not present

## 2017-10-13 DIAGNOSIS — R471 Dysarthria and anarthria: Secondary | ICD-10-CM | POA: Diagnosis not present

## 2017-10-13 DIAGNOSIS — R278 Other lack of coordination: Secondary | ICD-10-CM | POA: Insufficient documentation

## 2017-10-13 NOTE — Therapy (Signed)
[place] MAIN [facility] SERVICES [address] [place], [place], [address] Phone: [phone]   Fax:  [phone]  Occupational Therapy Treatment  Patient Details  Name: Jason Nolan MRN: [id number] Date of Birth: [birth year] Referring Provider: Jearld Fenton   Encounter Date: 10/13/2017  OT End of Session - 10/13/17 1716    Visit Number  46    Number of Visits  35    Date for OT Re-Evaluation  12/15/17    OT Start Time  1515    OT Stop Time  1600    OT Time Calculation (min)  45 min    Activity Tolerance  Patient tolerated treatment well    Behavior During Therapy  [facility] for tasks assessed/performed       Past Medical History:  Diagnosis Date  . Glaucoma   . Hepatitis C   . Stroke [facility])     Past Surgical History:  Procedure Laterality Date  . IR ANGIO INTRA EXTRACRAN SEL COM CAROTID INNOMINATE UNI L MOD SED  11/25/2016  . IR ANGIO VERTEBRAL SEL SUBCLAVIAN INNOMINATE UNI R MOD SED  11/25/2016  . IR ANGIO VERTEBRAL SEL VERTEBRAL UNI L MOD SED  11/25/2016  . IR INTRAVSC STENT CERV CAROTID W/O EMB-PROT MOD SED INC ANGIO  11/25/2016  . IR PERCUTANEOUS ART THROMBECTOMY/INFUSION INTRACRANIAL INC DIAG ANGIO  11/25/2016  . IR RADIOLOGIST EVAL & MGMT  02/13/2017  . RADIOLOGY WITH ANESTHESIA N/A 11/25/2016   Procedure: RADIOLOGY WITH ANESTHESIA;  Surgeon: Radiologist, Medication, MD;  Location: [place];  Service: Radiology;  Laterality: N/A;    There were no vitals filed for this visit.  Subjective Assessment - 10/13/17 1705    Subjective   Pt reported that his trip to [place] went well but had to do all the driving which made his R leg sore near his hip.     Pertinent History  Pt. is a 72 y.o. 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 Pt. was transferred to [facility]. He underwent CTA brain showed short segment near occlusion of proximal R-ICA with associated intraluminal thrombus --likely due to dissection and emergent large vessel  occlusion with right M2 occlusion and evolving right frontal lobe infarct.  He underwent cerebral angio with  complete revascularization of occluded superior division of R-MCA and near complete revascularization of proximal R-ICA with stent assisted angioplasty.  Stroke felt to be embolic in setting of ruptured plaque R-ICA and he was placed on ASA and brillinta due to stent. Pt. was transferred to inpatient rehab for several weeks, had home health therapy upon discharge, and now is ready for outpatient therapy services.    Limitations  Dominant LUE functioning, left hand edema, distal ROM, coordination, and overal strength.    Patient Stated Goals  To regain the use of his LUE.    Currently in Pain?  No/denies    Multiple Pain Sites  No                   OT Treatments/Exercises (OP) - 10/13/17 1707      Neurological Re-education Exercises   Other Exercises 1  Pt seen for using 2 pt pinch using L hand to pick up smooth marbles and supinate and pronate while holding marble to place in container.  He was able to complete 15 marbles before his hand fatigued and started to tighten and then performed weight bearing against the table with elbow extended and wrist extended. He continues to  have difficulty with isolated movements to move marble from palm of hand to finger tip.    Other Exercises 2  Pt worked on turning cards with his L hand and needed increased time.  Pt starting to work on shuffling using R hand with L hand and recommend he practice playing cards with his wife at home as well as small rubber band exercises at [facility] for continued control and strengthening at home.             OT Education - 10/13/17 1715    Education provided  Yes    Person(s) Educated  Patient    Methods  Explanation;Demonstration    Comprehension  Verbalized understanding;Returned demonstration    Education Details  Pt educated in more fine motor and strengthening exercises for L hand.    Person(s)  Educated  Patient    Methods  Explanation;Demonstration    Comprehension  Verbalized understanding;Returned demonstration          OT Long Term Goals - 09/22/17 1339      OT LONG TERM GOAL #1   Title  Pt. will improve edema by 2 cm through MPs in preparation for ROM.    Baseline  09/22/2017: Improved    Time  12    Status  Achieved      OT LONG TERM GOAL #3   Title  Pt. left hand grip with  increase by 5# in preparation for opening a jar.    Baseline  09/22/2017:     Time  12    Period  Weeks    Status  On-going    Target Date  12/15/17      OT LONG TERM GOAL #4   Title  Pt. will increased right shoulder strength by 1 mm grade to assist with ADLs.    Baseline  09/22/2017: Met    Time  12    Period  Weeks    Status  Achieved      OT LONG TERM GOAL #6   Title  Pt. will improve left hand coordination skills to be able to button clothing.    Baseline  09/22/2017: Pt. has improved with left hand coordination skills, continues to have difficulty with buttoning.    Time  2    Period  Weeks    Status  On-going    Target Date  12/15/17      OT LONG TERM GOAL #7   Title  Pt. will independently demonstrate visual compensatory strategies duirng ADLs, and IADLs.    Baseline  Glaucoma    Time  12    Period  Weeks    Status  Deferred      OT LONG TERM GOAL #8   Title  Pt. will increase Left pinch strength to be able to grasp and hold items during ADL tasks.    Baseline  09/22/2017: Pt. continues to progress however pinch strength, and ability to grasp, and hold objects.    Time  12    Period  Weeks    Status  On-going    Target Date  12/15/17      OT LONG TERM GOAL  #9   Baseline  Pt. will write one sentence with 75% legibility and efficiently while maintaing grasp on pen 100% of the time.    Period  Weeks    Status  On-going    Target Date  12/15/17      OT LONG TERM GOAL  #10   TITLE  Pt. will independently use his left hand to grasp and efficiently accomodate to the  varying weights of ADL objects during self care tasks.     Baseline  09/22/2017: Pt. conitnues to have difficulty grasping and adjusting hand to the varying weights of objects.    Time  12    Period  Weeks    Status  On-going    Target Date  12/15/17            Plan - 10/13/17 1716    Clinical Impression Statement  Pt continues to work on increasing active movement and control of L hand during ADLs at home.  He is trying to use his L hand to brush his teeth longer before switching to R hand.  He conitnues to have decreased fine motor control and thumb opposition is minimal but starting to work on Clinical cytogeneticist.  He continues to be motivated on improving LUE function for ADLs and IADLs.    Occupational performance deficits (Please refer to evaluation for details):  ADL's;IADL's    Rehab Potential  Excellent    OT Frequency  2x / week    OT Duration  12 weeks    OT Treatment/Interventions  Self-care/ADL training;Therapeutic exercise;Neuromuscular education;Patient/family education;Energy conservation;Therapeutic activities;DME and/or AE instruction    Clinical Decision Making  Several treatment options, min-mod task modification necessary    Consulted and Agree with Plan of Care  Patient       Patient will benefit from skilled therapeutic intervention in order to improve the following deficits and impairments:  Abnormal gait, Pain, Decreased strength, Decreased range of motion, Impaired UE functional use, Decreased coordination, Decreased knowledge of use of DME, Decreased activity tolerance, Difficulty walking, Decreased balance, Other (comment)  Visit Diagnosis: Muscle weakness (generalized)  Other lack of coordination    Problem List Patient Active Problem List   Diagnosis Date Noted  . 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 Sore throat 09/19/2017  . Low vitamin B12 level 06/27/2017  . 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 Pure  hypercholesterolemia 05/28/2017  . Erectile dysfunction 05/28/2017  . Left hemiparesis ([place])   . Dysphagia 11/29/2016  . Essential hypertension 11/29/2016  . 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 Leukopenia 03/31/2016  . Hepatitis C 07/22/2013    Chrys Racer, OTR/L ascom [phone] 10/13/17, 5:21 PM  [place] MAIN [facility] SERVICES [address] [place], [place], [address] Phone: [phone]   Fax:  [phone]  Name: Jason Nolan MRN: [id number] Date of Birth: Dec 31, 1945

## 2017-10-13 NOTE — Therapy (Cosign Needed Addendum)
[place] MAIN [facility] SERVICES [address] [place], [place], [address] Phone: [phone]   Fax:  [phone]  Speech Language Pathology Evaluation  Patient Details  Name: Jason Nolan MRN: [id number] Date of Birth: [birth year] Referring Provider: Eliezer Lofts   Encounter Date: 10/13/2017  End of Session - 10/13/17 1204    Visit Number  1    Number of Visits  1    Date for SLP Re-Evaluation  10/13/17    SLP Start Time  0900    SLP Stop Time   0945    SLP Time Calculation (min)  45 min    Activity Tolerance  Patient tolerated treatment well       Past Medical History:  Diagnosis Date   Glaucoma    Hepatitis C    Stroke [facility])     Past Surgical History:  Procedure Laterality Date   IR ANGIO INTRA EXTRACRAN SEL COM CAROTID INNOMINATE UNI L MOD SED  11/25/2016   IR ANGIO VERTEBRAL SEL SUBCLAVIAN INNOMINATE UNI R MOD SED  11/25/2016   IR ANGIO VERTEBRAL SEL VERTEBRAL UNI L MOD SED  11/25/2016   IR INTRAVSC STENT CERV CAROTID W/O EMB-PROT MOD SED INC ANGIO  11/25/2016   IR PERCUTANEOUS ART THROMBECTOMY/INFUSION INTRACRANIAL INC DIAG ANGIO  11/25/2016   IR RADIOLOGIST EVAL & MGMT  02/13/2017   RADIOLOGY WITH ANESTHESIA N/A 11/25/2016   Procedure: RADIOLOGY WITH ANESTHESIA;  Surgeon: Radiologist, Medication, MD;  Location: [place];  Service: Radiology;  Laterality: N/A;    There were no vitals filed for this visit.      SLP Evaluation [facility] - 10/13/17 0001      SLP Visit Information   SLP Received On  10/13/17    Referring Provider  Amy Diona Browner    Onset Date  11/25/16    Medical Diagnosis  Right MCA CVA      Subjective   Subjective   My voice is more slurred and Im getting lazy with my words. The right side of my throat is also sore and that where they put the stint in after my stroke    Patient/Family Stated Goal  To sound clearer and make sure the speech therapist thinks everything is ok.        Pain Assessment   Currently in Pain?  No/denies    Pain Score  0-No pain      General Information   HPI   Pt. 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 The patient received inpatient rehab (including ST) at [facility] 11/29/2016 - 12/21/2016.  He was also seen for outpatient speech therapy 02/17/2017- 03/18/2017. He was discharged because he had met all of his intelligibility goals.        Prior Functional Status   Cognitive/Linguistic Baseline  Within functional limits      Oral Motor/Sensory Function   Overall Oral Motor/Sensory Function  Impaired    Labial ROM  Reduced left    Labial Symmetry  Abnormal symmetry left    Labial Strength  Reduced Left    Labial Sensation  Reduced Left    Labial Coordination  WFL    Lingual ROM  Within Functional Limits    Lingual Symmetry  Within Functional Limits    Lingual Strength  Within Functional Limits    Lingual Sensation  Within Functional Limits    Lingual Coordination  WFL    Facial ROM  Reduced left    Facial Symmetry  Within Functional Limits    Facial Strength  Reduced    Facial Sensation  Reduced    Facial Coordination  Reduced    Velum  Within Functional Limits    Mandible  Within Functional Limits    Overall Oral Motor/Sensory Function  Mild      Motor Speech   Overall Motor Speech  Appears within functional limits for tasks assessed    Respiration  Within functional limits    Phonation  Normal    Resonance  Within functional limits    Articulation  Impaired    Level of Impairment  Conversation    Intelligibility  Intelligible    Motor Planning  Witnin functional limits    Effective Techniques  Slow rate;Over-articulate    Phonation  WFL      Standardized Assessments   Standardized Assessments   Other Assessment Motor Speech Evaluation       Voice:  Average time patient was able to sustain ah: 10 seconds  Average fundamental frequency during sustained ah: 142 Hz (n=  145 +/- 27)  Average time patient was able to sustain /s/: 8 seconds  Average time patient was able to sustain /z/: 10 seconds  s/z ratio : 0.8  Pitch: Initially struggled to elevate his pitch in a pitch glide but was able to imitate therapist when given two pitches at a time to match.  Visi-Pitch: Museum/gallery exhibitions officer (MDVP) MDVP extracts objective quantitative values (Relative Average Perturbation, Shimmer, Voice Turbulence Index, and Noise to Harmonic Ratio) on sustained phonation, which are displayed graphically and numerically in comparison to a built-in normative database. The patient exhibited values slightly outside the norm for Shimmer.  Tremor: Noted on the left side when preforming the motor evaluation tasks but not at rest.  Diadochokinetic Rates: Pt. was able to complete the task but was uneven in all 4 trials.  Pt. noted reduced sensitivity on the left side of his face but says that it is improving and he is gaining more sensation.  Intelligibility:  Pt. exhibited imprecise articulation and simplification of s-blend errors when speaking in conversation and while reading a passage but maintained intelligibility. When imitating individual words, he did not exhibit any of these motor speech errors.  Swallowing:  Pt. Reported painful swallowing, odynophagia, on the right side of his throat. He was able to drink a thin liquid (water) and masticate a dry solid (graham cracker) with no overt difficulties.       SLP Education - 10/13/17 1203    Education provided  Yes    Education Details   Patient instructed in extrinsic laryngeal muscle stretches and breath support exercises     Person(s) Educated  Patient    Methods  Explanation;Demonstration    Comprehension  Verbalized understanding;Returned demonstration            Plan - 10/13/17 1206    Clinical Impression Statement  Pt. is a 72 year old male with history of CVA 11/25/2016 is presenting with mild dysarthria  of speech as exhibited through imprecise articulation and simplification of s-blends. When he slowed the rate of his speech, he became more precise with his motor speech. He exhibited frequent throat clearing and reports increased post nasal drip. Pt. May benefit from a referral to ENT.  He was given extrinsic stretches to reduce tension and was encouraged to continue with materials from his former speech therapy. Continuing therapy is not indicated at this time.  Speech Therapy Frequency  One time visit    Treatment/Interventions  SLP instruction and feedback;Patient/family education;Other (comment) Extrinsic stretches    Potential to Achieve Goals  Good    Potential Considerations  Ability to learn/carryover information;Co-morbidities;Cooperation/participation level;Medical prognosis;Pain level;Previous level of function;Severity of impairments;Family/community support    SLP Home Exercise Plan  use strategies as necessary    Consulted and Agree with Plan of Care  Patient       Patient will benefit from skilled therapeutic intervention in order to improve the following deficits and impairments:   Dysarthria and anarthria - Plan: SLP plan of care cert/re-cert    Problem List Patient Active Problem List   Diagnosis Date Noted   Slurred speech due to motor weakness 09/19/2017   Sore throat 09/19/2017   Low vitamin B12 level 06/27/2017   Hepatitis B core antibody positive 06/27/2017   Pure hypercholesterolemia 05/28/2017   Erectile dysfunction 05/28/2017   Left hemiparesis ([place])    Dysphagia 11/29/2016   Essential hypertension 11/29/2016   Acute ischemic stroke ([place]) - 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

## 2017-10-16 ENCOUNTER — Ambulatory Visit: Payer: Medicare Other | Admitting: Occupational Therapy

## 2017-10-16 DIAGNOSIS — M6281 Muscle weakness (generalized): Secondary | ICD-10-CM | POA: Diagnosis not present

## 2017-10-16 DIAGNOSIS — R471 Dysarthria and anarthria: Secondary | ICD-10-CM | POA: Diagnosis not present

## 2017-10-16 DIAGNOSIS — R278 Other lack of coordination: Secondary | ICD-10-CM

## 2017-10-21 ENCOUNTER — Encounter: Payer: Self-pay | Admitting: Occupational Therapy

## 2017-10-21 ENCOUNTER — Ambulatory Visit: Payer: Medicare Other | Admitting: Occupational Therapy

## 2017-10-21 DIAGNOSIS — R278 Other lack of coordination: Secondary | ICD-10-CM | POA: Diagnosis not present

## 2017-10-21 DIAGNOSIS — R471 Dysarthria and anarthria: Secondary | ICD-10-CM | POA: Diagnosis not present

## 2017-10-21 DIAGNOSIS — M6281 Muscle weakness (generalized): Secondary | ICD-10-CM

## 2017-10-21 NOTE — Therapy (Signed)
[place] MAIN [facility] SERVICES [address] [place], [place], [address] Phone: [phone]   Fax:  [phone]  Occupational Therapy Treatment  Patient Details  Name: Jason Nolan MRN: [id number] Date of Birth: 07/04/1945 Referring Provider: Jearld Fenton   Encounter Date: 10/21/2017  OT End of Session - 10/21/17 1354    Visit Number  48    Number of Visits  472    Date for OT Re-Evaluation  12/15/17    Authorization Type  Visit 6 of 10 for progress report period starting 08/25/2017    OT Start Time  0945    OT Stop Time  1030    OT Time Calculation (min)  45 min    Activity Tolerance  Patient tolerated treatment well    Behavior During Therapy  [facility] for tasks assessed/performed       Past Medical History:  Diagnosis Date  . Glaucoma   . Hepatitis C   . Stroke [facility])     Past Surgical History:  Procedure Laterality Date  . IR ANGIO INTRA EXTRACRAN SEL COM CAROTID INNOMINATE UNI L MOD SED  11/25/2016  . IR ANGIO VERTEBRAL SEL SUBCLAVIAN INNOMINATE UNI R MOD SED  11/25/2016  . IR ANGIO VERTEBRAL SEL VERTEBRAL UNI L MOD SED  11/25/2016  . IR INTRAVSC STENT CERV CAROTID W/O EMB-PROT MOD SED INC ANGIO  11/25/2016  . IR PERCUTANEOUS ART THROMBECTOMY/INFUSION INTRACRANIAL INC DIAG ANGIO  11/25/2016  . IR RADIOLOGIST EVAL & MGMT  02/13/2017  . RADIOLOGY WITH ANESTHESIA N/A 11/25/2016   Procedure: RADIOLOGY WITH ANESTHESIA;  Surgeon: Radiologist, Medication, MD;  Location: [place];  Service: Radiology;  Laterality: N/A;    There were no vitals filed for this visit.  Subjective Assessment - 10/21/17 0825    Subjective   Patient reports he is doing well, has not really been able to write with left dominant hand yet but would like to get back to writing.     Pertinent History  Pt. is a 72 y.o. 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 Pt. was transferred to [facility]. He underwent CTA brain showed short segment near occlusion of  proximal R-ICA with associated intraluminal thrombus --likely due to dissection and emergent large vessel occlusion with right M2 occlusion and evolving right frontal lobe infarct.  He underwent cerebral angio with  complete revascularization of occluded superior division of R-MCA and near complete revascularization of proximal R-ICA with stent assisted angioplasty.  Stroke felt to be embolic in setting of ruptured plaque R-ICA and he was placed on ASA and brillinta due to stent. Pt. was transferred to inpatient rehab for several weeks, had home health therapy upon discharge, and now is ready for outpatient therapy services.    Limitations  Dominant LUE functioning, left hand edema, distal ROM, coordination, and overal strength.    Patient Stated Goals  To regain the use of his LUE.    Currently in Pain?  No/denies    Pain Score  0-No pain       OT TREATMENT    Neuromuscular re-ed:  Pt. worked on improving [facility] skills. Pt. worked on translatory movements of the hand with emphasis on moving objects through the hand, as well as thumb opposition. Pt. presented with limited movement at the thenar eminence, and the hypothenar eminence. Pt. Worked with various sized objects moving them through his hand starting with larger 1" objects, and progressively moving to smaller objects 1/2"  OT Education - 10/21/17 1353    Education provided  Yes    Education Details  [place], translatory movements.    Person(s) Educated  Patient    Methods  Explanation;Demonstration    Comprehension  Verbalized understanding;Returned demonstration          OT Long Term Goals - 09/22/17 1339      OT LONG TERM GOAL #1   Title  Pt. will improve edema by 2 cm through MPs in preparation for ROM.    Baseline  09/22/2017: Improved    Time  12    Status  Achieved      OT LONG TERM GOAL #3   Title  Pt. left hand grip with  increase by 5# in preparation for opening a jar.     Baseline  09/22/2017:     Time  12    Period  Weeks    Status  On-going    Target Date  12/15/17      OT LONG TERM GOAL #4   Title  Pt. will increased right shoulder strength by 1 mm grade to assist with ADLs.    Baseline  09/22/2017: Met    Time  12    Period  Weeks    Status  Achieved      OT LONG TERM GOAL #6   Title  Pt. will improve left hand coordination skills to be able to button clothing.    Baseline  09/22/2017: Pt. has improved with left hand coordination skills, continues to have difficulty with buttoning.    Time  2    Period  Weeks    Status  On-going    Target Date  12/15/17      OT LONG TERM GOAL #7   Title  Pt. will independently demonstrate visual compensatory strategies duirng ADLs, and IADLs.    Baseline  Glaucoma    Time  12    Period  Weeks    Status  Deferred      OT LONG TERM GOAL #8   Title  Pt. will increase Left pinch strength to be able to grasp and hold items during ADL tasks.    Baseline  09/22/2017: Pt. continues to progress however pinch strength, and ability to grasp, and hold objects.    Time  12    Period  Weeks    Status  On-going    Target Date  12/15/17      OT LONG TERM GOAL  #9   Baseline  Pt. will write one sentence with 75% legibility and efficiently while maintaing grasp on pen 100% of the time.    Period  Weeks    Status  On-going    Target Date  12/15/17      OT LONG TERM GOAL  #10   TITLE  Pt. will independently use his left hand to grasp and efficiently accomodate to the varying weights of ADL objects during self care tasks.     Baseline  09/22/2017: Pt. conitnues to have difficulty grasping and adjusting hand to the varying weights of objects.    Time  12    Period  Weeks    Status  On-going    Target Date  12/15/17            Plan - 10/21/17 1354    Clinical Impression Statement  Pt. continues to present with limited LUE strength, and coordination skillsss. Pt. continues to have difficulty with translatory movements  of the hand, and  limited thumb opposition. Pt. continues to work on improving UE strength, and coordination skills fopr improved ADL, and IADL functioning.    Occupational performance deficits (Please refer to evaluation for details):  ADL's;IADL's    Rehab Potential  Excellent    OT Frequency  2x / week    OT Duration  12 weeks    OT Treatment/Interventions  Self-care/ADL training;Therapeutic exercise;Neuromuscular education;Patient/family education;Energy conservation;Therapeutic activities;DME and/or AE instruction    Clinical Decision Making  Several treatment options, min-mod task modification necessary    Consulted and Agree with Plan of Care  Patient       Patient will benefit from skilled therapeutic intervention in order to improve the following deficits and impairments:  Abnormal gait, Pain, Decreased strength, Decreased range of motion, Impaired UE functional use, Decreased coordination, Decreased knowledge of use of DME, Decreased activity tolerance, Difficulty walking, Decreased balance, Other (comment)  Visit Diagnosis: Muscle weakness (generalized)    Problem List Patient Active Problem List   Diagnosis Date Noted  . 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 Sore throat 09/19/2017  . Low vitamin B12 level 06/27/2017  . 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 Pure hypercholesterolemia 05/28/2017  . Erectile dysfunction 05/28/2017  . Left hemiparesis ([place])   . Dysphagia 11/29/2016  . Essential hypertension 11/29/2016  . 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 Leukopenia 03/31/2016  . Hepatitis C 07/22/2013    Harrel Carina, MS, OTR/L 10/21/2017, 3:48 PM  [place] MAIN [facility] SERVICES [address] [place], [place], [address] Phone: [phone]   Fax:  [phone]  Name: Jason Nolan MRN:  [id number] Date of Birth: [birth year]

## 2017-10-21 NOTE — Therapy (Signed)
[place] MAIN [facility] SERVICES [address] [place], [place], [address] Phone: [phone]   Fax:  [phone]  Occupational Therapy Treatment  Patient Details  Name: Jason Nolan MRN: [id number] Date of Birth: 1946/01/16 Referring Provider: Jearld Fenton   Encounter Date: 10/16/2017  OT End of Session - 10/21/17 1042    Visit Number  34    Number of Visits  67    Date for OT Re-Evaluation  12/15/17    Authorization Type  Visit 5 of 10 for progress report period starting 08/25/2017    OT Start Time  1300    OT Stop Time  1354    OT Time Calculation (min)  54 min    Activity Tolerance  Patient tolerated treatment well    Behavior During Therapy  [facility] for tasks assessed/performed       Past Medical History:  Diagnosis Date  . Glaucoma   . Hepatitis C   . Stroke [facility])     Past Surgical History:  Procedure Laterality Date  . IR ANGIO INTRA EXTRACRAN SEL COM CAROTID INNOMINATE UNI L MOD SED  11/25/2016  . IR ANGIO VERTEBRAL SEL SUBCLAVIAN INNOMINATE UNI R MOD SED  11/25/2016  . IR ANGIO VERTEBRAL SEL VERTEBRAL UNI L MOD SED  11/25/2016  . IR INTRAVSC STENT CERV CAROTID W/O EMB-PROT MOD SED INC ANGIO  11/25/2016  . IR PERCUTANEOUS ART THROMBECTOMY/INFUSION INTRACRANIAL INC DIAG ANGIO  11/25/2016  . IR RADIOLOGIST EVAL & MGMT  02/13/2017  . RADIOLOGY WITH ANESTHESIA N/A 11/25/2016   Procedure: RADIOLOGY WITH ANESTHESIA;  Surgeon: Radiologist, Medication, MD;  Location: [place];  Service: Radiology;  Laterality: N/A;    There were no vitals filed for this visit.  Subjective Assessment - 10/21/17 0825    Subjective   Patient reports he is doing well, has not really been able to write with left dominant hand yet but would like to get back to writing.     Pertinent History  Pt. is a 72 y.o. 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 Pt. was transferred to [facility]. He underwent CTA brain showed short segment near occlusion of  proximal R-ICA with associated intraluminal thrombus --likely due to dissection and emergent large vessel occlusion with right M2 occlusion and evolving right frontal lobe infarct.  He underwent cerebral angio with  complete revascularization of occluded superior division of R-MCA and near complete revascularization of proximal R-ICA with stent assisted angioplasty.  Stroke felt to be embolic in setting of ruptured plaque R-ICA and he was placed on ASA and brillinta due to stent. Pt. was transferred to inpatient rehab for several weeks, had home health therapy upon discharge, and now is ready for outpatient therapy services.    Limitations  Dominant LUE functioning, left hand edema, distal ROM, coordination, and overal strength.    Patient Stated Goals  To regain the use of his LUE.    Currently in Pain?  No/denies    Pain Score  0-No pain                   OT Treatments/Exercises (OP) - 10/21/17 0825      ADLs   Writing  Patient seen for handwriting this date with attempts to use left dominant hand.  Instructed on red built up pen for increased stability of grasp with increased surface area.  Pt working to perform first and last name.  Able to hold pen this date with modified  pen and able to write with fair legibility.  Letter formation is small and does best with printing rather than script writing.       Neurological Re-education Exercises   Other Exercises 1  Grip strength measurement on right 65#, left 25#.  Patient seen for left hand strengthening with resistive green putty for gross gripping, twisting, pronation/supination, wrist extension pulls with cues for technqiue.  Patient seen for resistive pinch skills with green putty for 3 point, 2  point and lateral pinches..             OT Education - 10/21/17 1041    Education provided  Yes    Education Details  resistive putty exercises    Person(s) Educated  Patient    Methods  Explanation;Demonstration    Comprehension   Verbalized understanding;Returned demonstration          OT Long Term Goals - 09/22/17 1339      OT LONG TERM GOAL #1   Title  Pt. will improve edema by 2 cm through MPs in preparation for ROM.    Baseline  09/22/2017: Improved    Time  12    Status  Achieved      OT LONG TERM GOAL #3   Title  Pt. left hand grip with  increase by 5# in preparation for opening a jar.    Baseline  09/22/2017:     Time  12    Period  Weeks    Status  On-going    Target Date  12/15/17      OT LONG TERM GOAL #4   Title  Pt. will increased right shoulder strength by 1 mm grade to assist with ADLs.    Baseline  09/22/2017: Met    Time  12    Period  Weeks    Status  Achieved      OT LONG TERM GOAL #6   Title  Pt. will improve left hand coordination skills to be able to button clothing.    Baseline  09/22/2017: Pt. has improved with left hand coordination skills, continues to have difficulty with buttoning.    Time  2    Period  Weeks    Status  On-going    Target Date  12/15/17      OT LONG TERM GOAL #7   Title  Pt. will independently demonstrate visual compensatory strategies duirng ADLs, and IADLs.    Baseline  Glaucoma    Time  12    Period  Weeks    Status  Deferred      OT LONG TERM GOAL #8   Title  Pt. will increase Left pinch strength to be able to grasp and hold items during ADL tasks.    Baseline  09/22/2017: Pt. continues to progress however pinch strength, and ability to grasp, and hold objects.    Time  12    Period  Weeks    Status  On-going    Target Date  12/15/17      OT LONG TERM GOAL  #9   Baseline  Pt. will write one sentence with 75% legibility and efficiently while maintaing grasp on pen 100% of the time.    Period  Weeks    Status  On-going    Target Date  12/15/17      OT LONG TERM GOAL  #10   TITLE  Pt. will independently use his left hand to grasp and efficiently accomodate to the varying weights of  ADL objects during self care tasks.     Baseline  09/22/2017:  Pt. conitnues to have difficulty grasping and adjusting hand to the varying weights of objects.    Time  12    Period  Weeks    Status  On-going    Target Date  12/15/17            Plan - 10/21/17 1043    Clinical Impression Statement  Patient seen for handwriting skills this date with use of left dominant hand and built up pen.  He was able to hold larger surface pen and write name in print and script with fair legibility, he performs best with lined paper, built up pen and printing at this point for improved legibility.  Encouraged patient to continue working on hand writing at home and to try to get a larger pen.  We will continue to use large built up pen in clinic and then give patient info on purchasing on his own if he feels it does best with it. Continues to work towards improving strength in left hand, grip remeasured this date and has 25# on left and 65# on right and needs to continue to work on this area since it is his dominant hand.     Occupational performance deficits (Please refer to evaluation for details):  ADL's;IADL's    Rehab Potential  Excellent    OT Frequency  2x / week    OT Duration  12 weeks    OT Treatment/Interventions  Self-care/ADL training;Therapeutic exercise;Neuromuscular education;Patient/family education;Energy conservation;Therapeutic activities;DME and/or AE instruction    Consulted and Agree with Plan of Care  Patient       Patient will benefit from skilled therapeutic intervention in order to improve the following deficits and impairments:  Abnormal gait, Pain, Decreased strength, Decreased range of motion, Impaired UE functional use, Decreased coordination, Decreased knowledge of use of DME, Decreased activity tolerance, Difficulty walking, Decreased balance, Other (comment)  Visit Diagnosis: Muscle weakness (generalized)  Other lack of coordination    Problem List Patient Active Problem List   Diagnosis Date Noted  . Slurred speech due to  motor weakness 09/19/2017  . Sore throat 09/19/2017  . Low vitamin B12 level 06/27/2017  . 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 Pure hypercholesterolemia 05/28/2017  . Erectile dysfunction 05/28/2017  . Left hemiparesis ([place])   . Dysphagia 11/29/2016  . Essential hypertension 11/29/2016  . 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 Leukopenia 03/31/2016  . Hepatitis C 07/22/2013   Taquila Leys T Tomasita Morrow, OTR/L, CLT  Marianne Golightly 10/21/2017, 10:47 AM  [place] MAIN [facility] SERVICES [address] [place], [place], [address] Phone: [phone]   Fax:  [phone]  Name: Josemaria Brining MRN: [id number] Date of Birth: 1945/10/08

## 2017-10-23 ENCOUNTER — Encounter: Payer: Self-pay | Admitting: Occupational Therapy

## 2017-10-23 ENCOUNTER — Ambulatory Visit: Payer: Medicare Other | Admitting: Occupational Therapy

## 2017-10-23 DIAGNOSIS — R471 Dysarthria and anarthria: Secondary | ICD-10-CM | POA: Diagnosis not present

## 2017-10-23 DIAGNOSIS — M6281 Muscle weakness (generalized): Secondary | ICD-10-CM | POA: Diagnosis not present

## 2017-10-23 DIAGNOSIS — R278 Other lack of coordination: Secondary | ICD-10-CM | POA: Diagnosis not present

## 2017-10-23 NOTE — Therapy (Signed)
[place] MAIN [facility] SERVICES [address] [place], [place], [address] Phone: [phone]   Fax:  [phone]  Occupational Therapy Treatment  Patient Details  Name: Jason Nolan MRN: [id number] Date of Birth: [birth year] Referring Provider: Jearld Fenton   Encounter Date: 10/23/2017  OT End of Session - 10/23/17 0947    Visit Number  7    Date for OT Re-Evaluation  12/15/17    Authorization Type  Visit 7 of 10 for progress report period starting 08/25/2017    OT Start Time  0930    OT Stop Time  1015    OT Time Calculation (min)  45 min    Activity Tolerance  Patient tolerated treatment well    Behavior During Therapy  [facility] for tasks assessed/performed       Past Medical History:  Diagnosis Date  . Glaucoma   . Hepatitis C   . Stroke [facility])     Past Surgical History:  Procedure Laterality Date  . IR ANGIO INTRA EXTRACRAN SEL COM CAROTID INNOMINATE UNI L MOD SED  11/25/2016  . IR ANGIO VERTEBRAL SEL SUBCLAVIAN INNOMINATE UNI R MOD SED  11/25/2016  . IR ANGIO VERTEBRAL SEL VERTEBRAL UNI L MOD SED  11/25/2016  . IR INTRAVSC STENT CERV CAROTID W/O EMB-PROT MOD SED INC ANGIO  11/25/2016  . IR PERCUTANEOUS ART THROMBECTOMY/INFUSION INTRACRANIAL INC DIAG ANGIO  11/25/2016  . IR RADIOLOGIST EVAL & MGMT  02/13/2017  . RADIOLOGY WITH ANESTHESIA N/A 11/25/2016   Procedure: RADIOLOGY WITH ANESTHESIA;  Surgeon: Radiologist, Medication, MD;  Location: [place];  Service: Radiology;  Laterality: N/A;    There were no vitals filed for this visit.  Subjective Assessment - 10/23/17 0939    Subjective   Pt. reports having sore throat.    Patient is accompained by:  Family member    Pertinent History  Pt. is a 72 y.o. 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 Pt. was transferred to [facility]. He underwent CTA brain showed short segment near occlusion of proximal R-ICA with associated intraluminal thrombus --likely due to dissection and  emergent large vessel occlusion with right M2 occlusion and evolving right frontal lobe infarct.  He underwent cerebral angio with  complete revascularization of occluded superior division of R-MCA and near complete revascularization of proximal R-ICA with stent assisted angioplasty.  Stroke felt to be embolic in setting of ruptured plaque R-ICA and he was placed on ASA and brillinta due to stent. Pt. was transferred to inpatient rehab for several weeks, had home health therapy upon discharge, and now is ready for outpatient therapy services.    Limitations  Dominant LUE functioning, left hand edema, distal ROM, coordination, and overal strength.    Patient Stated Goals  To regain the use of his LUE.    Currently in Pain?  No/denies      OT TREATMENT    Neuro muscular re-education:  Pt. worked on grasping 1" resistive cubes alternating thumb opposition to the tip of the 2nd through 5th digits while the board is placed at a vertical angle. Pt. worked on pressing the cubes back into place while alternating isolated 2nd through 5th digit extension. Pt. worked on grasping coins from a tabletop surface, placing them into a resistive container, and pushing them through the slot while isolating his 2nd digit. Pt. worked on translatory movements of the hand moving the coins from the palm of his hand to the tip of his 2nd digit.  OT Education - 10/23/17 0945    Education provided  Yes    Education Details  [place], translatory movements.    Person(s) Educated  Patient    Methods  Explanation;Demonstration    Comprehension  Verbalized understanding;Returned demonstration          OT Long Term Goals - 09/22/17 1339      OT LONG TERM GOAL #1   Title  Pt. will improve edema by 2 cm through MPs in preparation for ROM.    Baseline  09/22/2017: Improved    Time  12    Status  Achieved      OT LONG TERM GOAL #3   Title  Pt. left hand grip with  increase by  5# in preparation for opening a jar.    Baseline  09/22/2017:     Time  12    Period  Weeks    Status  On-going    Target Date  12/15/17      OT LONG TERM GOAL #4   Title  Pt. will increased right shoulder strength by 1 mm grade to assist with ADLs.    Baseline  09/22/2017: Met    Time  12    Period  Weeks    Status  Achieved      OT LONG TERM GOAL #6   Title  Pt. will improve left hand coordination skills to be able to button clothing.    Baseline  09/22/2017: Pt. has improved with left hand coordination skills, continues to have difficulty with buttoning.    Time  2    Period  Weeks    Status  On-going    Target Date  12/15/17      OT LONG TERM GOAL #7   Title  Pt. will independently demonstrate visual compensatory strategies duirng ADLs, and IADLs.    Baseline  Glaucoma    Time  12    Period  Weeks    Status  Deferred      OT LONG TERM GOAL #8   Title  Pt. will increase Left pinch strength to be able to grasp and hold items during ADL tasks.    Baseline  09/22/2017: Pt. continues to progress however pinch strength, and ability to grasp, and hold objects.    Time  12    Period  Weeks    Status  On-going    Target Date  12/15/17      OT LONG TERM GOAL  #9   Baseline  Pt. will write one sentence with 75% legibility and efficiently while maintaing grasp on pen 100% of the time.    Period  Weeks    Status  On-going    Target Date  12/15/17      OT LONG TERM GOAL  #10   TITLE  Pt. will independently use his left hand to grasp and efficiently accomodate to the varying weights of ADL objects during self care tasks.     Baseline  09/22/2017: Pt. conitnues to have difficulty grasping and adjusting hand to the varying weights of objects.    Time  12    Period  Weeks    Status  On-going    Target Date  12/15/17            Plan - 10/23/17 0948    Clinical Impression Statement  Pt. reports having a sore throat that is affecting his tongue, and speech. Pt. is improving with  thumb opposition, and thumb movements. Pt.  continues to work on improving translatory movements of the hand moving objects through the hand.     Occupational performance deficits (Please refer to evaluation for details):  ADL's;IADL's    Rehab Potential  Excellent    OT Frequency  2x / week    OT Duration  12 weeks    OT Treatment/Interventions  Self-care/ADL training;Therapeutic exercise;Neuromuscular education;Patient/family education;Energy conservation;Therapeutic activities;DME and/or AE instruction    Clinical Decision Making  Several treatment options, min-mod task modification necessary    Consulted and Agree with Plan of Care  Patient       Patient will benefit from skilled therapeutic intervention in order to improve the following deficits and impairments:  Abnormal gait, Pain, Decreased strength, Decreased range of motion, Impaired UE functional use, Decreased coordination, Decreased knowledge of use of DME, Decreased activity tolerance, Difficulty walking, Decreased balance, Other (comment)  Visit Diagnosis: Muscle weakness (generalized)  Other lack of coordination    Problem List Patient Active Problem List   Diagnosis Date Noted  . 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 Sore throat 09/19/2017  . Low vitamin B12 level 06/27/2017  . 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 Pure hypercholesterolemia 05/28/2017  . Erectile dysfunction 05/28/2017  . Left hemiparesis ([place])   . Dysphagia 11/29/2016  . Essential hypertension 11/29/2016  . 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 Leukopenia 03/31/2016  . Hepatitis C 07/22/2013    Harrel Carina, MS, OTR/L 10/23/2017, 10:11 AM  [place] MAIN [facility] SERVICES [address] [place], [place], [address] Phone: [phone]   Fax:  [phone]  Name: Kashus Karlen MRN: [id number] Date of Birth: [birth year]

## 2017-10-28 ENCOUNTER — Encounter: Payer: Self-pay | Admitting: Occupational Therapy

## 2017-10-28 ENCOUNTER — Ambulatory Visit: Payer: Medicare Other | Admitting: Occupational Therapy

## 2017-10-28 DIAGNOSIS — M6281 Muscle weakness (generalized): Secondary | ICD-10-CM

## 2017-10-28 DIAGNOSIS — R278 Other lack of coordination: Secondary | ICD-10-CM

## 2017-10-28 DIAGNOSIS — R471 Dysarthria and anarthria: Secondary | ICD-10-CM | POA: Diagnosis not present

## 2017-10-28 NOTE — Therapy (Signed)
[place] MAIN [facility] SERVICES [address] [place], [place], [address] Phone: [phone]   Fax:  [phone]  Occupational Therapy Treatment  Patient Details  Name: Jason Nolan MRN: [id number] Date of Birth: [birth year] Referring Provider: Jearld Fenton   Encounter Date: 10/28/2017  OT End of Session - 10/28/17 0958    Visit Number  50    Number of Visits  58    Date for OT Re-Evaluation  12/15/17    Authorization Type  Visit 8 of 10 for progress report period starting 08/25/2017    OT Start Time  0935    OT Stop Time  1020    OT Time Calculation (min)  45 min    Activity Tolerance  Patient tolerated treatment well    Behavior During Therapy  [facility] for tasks assessed/performed       Past Medical History:  Diagnosis Date  . Glaucoma   . Hepatitis C   . Stroke [facility])     Past Surgical History:  Procedure Laterality Date  . IR ANGIO INTRA EXTRACRAN SEL COM CAROTID INNOMINATE UNI L MOD SED  11/25/2016  . IR ANGIO VERTEBRAL SEL SUBCLAVIAN INNOMINATE UNI R MOD SED  11/25/2016  . IR ANGIO VERTEBRAL SEL VERTEBRAL UNI L MOD SED  11/25/2016  . IR INTRAVSC STENT CERV CAROTID W/O EMB-PROT MOD SED INC ANGIO  11/25/2016  . IR PERCUTANEOUS ART THROMBECTOMY/INFUSION INTRACRANIAL INC DIAG ANGIO  11/25/2016  . IR RADIOLOGIST EVAL & MGMT  02/13/2017  . RADIOLOGY WITH ANESTHESIA N/A 11/25/2016   Procedure: RADIOLOGY WITH ANESTHESIA;  Surgeon: Radiologist, Medication, MD;  Location: [place];  Service: Radiology;  Laterality: N/A;    There were no vitals filed for this visit.  Subjective Assessment - 10/28/17 0956    Subjective   Pt. reports having a sore throat.    Patient is accompained by:  Family member    Pertinent History  Pt. is a 72 y.o. 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 Pt. was transferred to [facility]. He underwent CTA brain showed short segment near occlusion of proximal R-ICA with associated intraluminal thrombus --likely  due to dissection and emergent large vessel occlusion with right M2 occlusion and evolving right frontal lobe infarct.  He underwent cerebral angio with  complete revascularization of occluded superior division of R-MCA and near complete revascularization of proximal R-ICA with stent assisted angioplasty.  Stroke felt to be embolic in setting of ruptured plaque R-ICA and he was placed on ASA and brillinta due to stent. Pt. was transferred to inpatient rehab for several weeks, had home health therapy upon discharge, and now is ready for outpatient therapy services.    Currently in Pain?  No/denies      OT TREATMENT    Neuro muscular re-education:  Pt. worked on Nurse, children's. Pt. worked translatory movements of the hand. Pt. worked on grasping 1/2" flat marbles moving to standard marbles. Pt. had difficulty with moving the flat marbles for the palm of his hand to the tip of his 2nd digit. Pt. worked on grasping and storing the objects. Pt. Had difficulty today with the translatory movements of the left hand.                        OT Education - 10/28/17 0958    Education provided  Yes    Education Details  [place], translatory movements.    Person(s) Educated  Patient  Methods  Explanation;Demonstration    Comprehension  Verbalized understanding;Returned demonstration          OT Long Term Goals - 09/22/17 1339      OT LONG TERM GOAL #1   Title  Pt. will improve edema by 2 cm through MPs in preparation for ROM.    Baseline  09/22/2017: Improved    Time  12    Status  Achieved      OT LONG TERM GOAL #3   Title  Pt. left hand grip with  increase by 5# in preparation for opening a jar.    Baseline  09/22/2017:     Time  12    Period  Weeks    Status  On-going    Target Date  12/15/17      OT LONG TERM GOAL #4   Title  Pt. will increased right shoulder strength by 1 mm grade to assist with ADLs.    Baseline  09/22/2017: Met    Time  12    Period  Weeks     Status  Achieved      OT LONG TERM GOAL #6   Title  Pt. will improve left hand coordination skills to be able to button clothing.    Baseline  09/22/2017: Pt. has improved with left hand coordination skills, continues to have difficulty with buttoning.    Time  2    Period  Weeks    Status  On-going    Target Date  12/15/17      OT LONG TERM GOAL #7   Title  Pt. will independently demonstrate visual compensatory strategies duirng ADLs, and IADLs.    Baseline  Glaucoma    Time  12    Period  Weeks    Status  Deferred      OT LONG TERM GOAL #8   Title  Pt. will increase Left pinch strength to be able to grasp and hold items during ADL tasks.    Baseline  09/22/2017: Pt. continues to progress however pinch strength, and ability to grasp, and hold objects.    Time  12    Period  Weeks    Status  On-going    Target Date  12/15/17      OT LONG TERM GOAL  #9   Baseline  Pt. will write one sentence with 75% legibility and efficiently while maintaing grasp on pen 100% of the time.    Period  Weeks    Status  On-going    Target Date  12/15/17      OT LONG TERM GOAL  #10   TITLE  Pt. will independently use his left hand to grasp and efficiently accomodate to the varying weights of ADL objects during self care tasks.     Baseline  09/22/2017: Pt. conitnues to have difficulty grasping and adjusting hand to the varying weights of objects.    Time  12    Period  Weeks    Status  On-going    Target Date  12/15/17            Plan - 10/28/17 1004    Clinical Impression Statement  Pt. reports his hand feels stiffer today. Pt. continues to present with limited fine motor coordination skills, thumb opposition, and translatory movements of the left hand.     Occupational performance deficits (Please refer to evaluation for details):  ADL's;IADL's    Rehab Potential  Excellent    OT Frequency  2x /  week    OT Duration  12 weeks    OT Treatment/Interventions  Self-care/ADL  training;Therapeutic exercise;Neuromuscular education;Patient/family education;Energy conservation;Therapeutic activities;DME and/or AE instruction    Clinical Decision Making  Several treatment options, min-mod task modification necessary    Consulted and Agree with Plan of Care  Patient       Patient will benefit from skilled therapeutic intervention in order to improve the following deficits and impairments:  Abnormal gait, Pain, Decreased strength, Decreased range of motion, Impaired UE functional use, Decreased coordination, Decreased knowledge of use of DME, Decreased activity tolerance, Difficulty walking, Decreased balance, Other (comment)  Visit Diagnosis: Muscle weakness (generalized)  Other lack of coordination    Problem List Patient Active Problem List   Diagnosis Date Noted  . 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 Sore throat 09/19/2017  . Low vitamin B12 level 06/27/2017  . 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 Pure hypercholesterolemia 05/28/2017  . Erectile dysfunction 05/28/2017  . Left hemiparesis ([place])   . Dysphagia 11/29/2016  . Essential hypertension 11/29/2016  . 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 Leukopenia 03/31/2016  . Hepatitis C 07/22/2013    Harrel Carina, MS, OTR/L 10/28/2017, 11:15 AM  [place] MAIN [facility] SERVICES [address] [place], [place], [address] Phone: [phone]   Fax:  [phone]  Name: Thelma Viana MRN: [id number] Date of Birth: 20-Oct-1945

## 2017-10-30 ENCOUNTER — Ambulatory Visit: Payer: Medicare Other | Admitting: Occupational Therapy

## 2017-10-30 ENCOUNTER — Encounter: Payer: Self-pay | Admitting: Occupational Therapy

## 2017-10-30 DIAGNOSIS — R278 Other lack of coordination: Secondary | ICD-10-CM | POA: Diagnosis not present

## 2017-10-30 DIAGNOSIS — R471 Dysarthria and anarthria: Secondary | ICD-10-CM | POA: Diagnosis not present

## 2017-10-30 DIAGNOSIS — M6281 Muscle weakness (generalized): Secondary | ICD-10-CM | POA: Diagnosis not present

## 2017-10-30 NOTE — Therapy (Signed)
[place] MAIN [facility] SERVICES [address] [place], [place], [address] Phone: [phone]   Fax:  [phone]  Occupational Therapy Treatment  Patient Details  Name: Jason Nolan MRN: [id number] Date of Birth: [birth year] Referring Provider: Jearld Fenton   Encounter Date: 10/30/2017  OT End of Session - 10/30/17 1051    Visit Number  54    Number of Visits  87    Date for OT Re-Evaluation  12/15/17    Authorization Type  Visit 9 of 10 for progress report period starting 08/25/2017    OT Start Time  1015    OT Stop Time  1100    OT Time Calculation (min)  45 min    Activity Tolerance  Patient tolerated treatment well    Behavior During Therapy  [facility] for tasks assessed/performed       Past Medical History:  Diagnosis Date  . Glaucoma   . Hepatitis C   . Stroke [facility])     Past Surgical History:  Procedure Laterality Date  . IR ANGIO INTRA EXTRACRAN SEL COM CAROTID INNOMINATE UNI L MOD SED  11/25/2016  . IR ANGIO VERTEBRAL SEL SUBCLAVIAN INNOMINATE UNI R MOD SED  11/25/2016  . IR ANGIO VERTEBRAL SEL VERTEBRAL UNI L MOD SED  11/25/2016  . IR INTRAVSC STENT CERV CAROTID W/O EMB-PROT MOD SED INC ANGIO  11/25/2016  . IR PERCUTANEOUS ART THROMBECTOMY/INFUSION INTRACRANIAL INC DIAG ANGIO  11/25/2016  . IR RADIOLOGIST EVAL & MGMT  02/13/2017  . RADIOLOGY WITH ANESTHESIA N/A 11/25/2016   Procedure: RADIOLOGY WITH ANESTHESIA;  Surgeon: Radiologist, Medication, MD;  Location: [place];  Service: Radiology;  Laterality: N/A;    There were no vitals filed for this visit.  Subjective Assessment - 10/30/17 1041    Subjective   Pt. continues to have throat pain.    Patient is accompained by:  Family member    Pertinent History  Pt. is a 72 y.o. 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 Pt. was transferred to [facility]. He underwent CTA brain showed short segment near occlusion of proximal R-ICA with associated intraluminal thrombus --likely  due to dissection and emergent large vessel occlusion with right M2 occlusion and evolving right frontal lobe infarct.  He underwent cerebral angio with  complete revascularization of occluded superior division of R-MCA and near complete revascularization of proximal R-ICA with stent assisted angioplasty.  Stroke felt to be embolic in setting of ruptured plaque R-ICA and he was placed on ASA and brillinta due to stent. Pt. was transferred to inpatient rehab for several weeks, had home health therapy upon discharge, and now is ready for outpatient therapy services.    Currently in Pain?  --    Pain Score  --      OT TREATMENT    Neuro muscular re-education:  Pt. worked on improving right hand translatory movements of the hand, thumb opposition, and movement patterns. Pt. worked on grasping 1/2" flat marbles. Pt. worked on moving them through his hand to the tip of his 2nd digit, and thumb. Pt. worked on tasks to sustain lateral pinch on resistive tweezers while grasping and moving 2" toothpick sticks from a horizontal flat position to a vertical position in order to place it in the holder. Pt. was able to sustain grasp while positioning and extending the wrist/hand in the necessary alignment needed to place the stick through the top of the holder.  OT Long Term Goals - 09/22/17 1339      OT LONG TERM GOAL #1   Title  Pt. will improve edema by 2 cm through MPs in preparation for ROM.    Baseline  09/22/2017: Improved    Time  12    Status  Achieved      OT LONG TERM GOAL #3   Title  Pt. left hand grip with  increase by 5# in preparation for opening a jar.    Baseline  09/22/2017:     Time  12    Period  Weeks    Status  On-going    Target Date  12/15/17      OT LONG TERM GOAL #4   Title  Pt. will increased right shoulder strength by 1 mm grade to assist with ADLs.    Baseline  09/22/2017: Met    Time  12    Period  Weeks    Status   Achieved      OT LONG TERM GOAL #6   Title  Pt. will improve left hand coordination skills to be able to button clothing.    Baseline  09/22/2017: Pt. has improved with left hand coordination skills, continues to have difficulty with buttoning.    Time  2    Period  Weeks    Status  On-going    Target Date  12/15/17      OT LONG TERM GOAL #7   Title  Pt. will independently demonstrate visual compensatory strategies duirng ADLs, and IADLs.    Baseline  Glaucoma    Time  12    Period  Weeks    Status  Deferred      OT LONG TERM GOAL #8   Title  Pt. will increase Left pinch strength to be able to grasp and hold items during ADL tasks.    Baseline  09/22/2017: Pt. continues to progress however pinch strength, and ability to grasp, and hold objects.    Time  12    Period  Weeks    Status  On-going    Target Date  12/15/17      OT LONG TERM GOAL  #9   Baseline  Pt. will write one sentence with 75% legibility and efficiently while maintaing grasp on pen 100% of the time.    Period  Weeks    Status  On-going    Target Date  12/15/17      OT LONG TERM GOAL  #10   TITLE  Pt. will independently use his left hand to grasp and efficiently accomodate to the varying weights of ADL objects during self care tasks.     Baseline  09/22/2017: Pt. conitnues to have difficulty grasping and adjusting hand to the varying weights of objects.    Time  12    Period  Weeks    Status  On-going    Target Date  12/15/17            Plan - 10/30/17 1052    Clinical Impression Statement  Pt. reports his throat is still bothering him. Pt. also reports that he feels tightness in his throat after exercising at the gym on the treadmill, and stairmaster. Pt. continues to have pain in the right side of his neck. Pt. has a palpable lymph node on the right side of his neck. Pt. continues to improve with translatory movements of the hand. Pt. continues to work on improving LUE strength, and fine motor coodintation  skills    Occupational performance deficits (Please refer to evaluation for details):  ADL's;IADL's    Rehab Potential  Excellent    OT Frequency  2x / week    OT Duration  12 weeks    OT Treatment/Interventions  Self-care/ADL training;Therapeutic exercise;Neuromuscular education;Patient/family education;Energy conservation;Therapeutic activities;DME and/or AE instruction    Clinical Decision Making  Several treatment options, min-mod task modification necessary    Consulted and Agree with Plan of Care  Patient       Patient will benefit from skilled therapeutic intervention in order to improve the following deficits and impairments:  Abnormal gait, Pain, Decreased strength, Decreased range of motion, Impaired UE functional use, Decreased coordination, Decreased knowledge of use of DME, Decreased activity tolerance, Difficulty walking, Decreased balance, Other (comment)  Visit Diagnosis: Muscle weakness (generalized)  Other lack of coordination    Problem List Patient Active Problem List   Diagnosis Date Noted  . 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 Sore throat 09/19/2017  . Low vitamin B12 level 06/27/2017  . 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 Pure hypercholesterolemia 05/28/2017  . Erectile dysfunction 05/28/2017  . Left hemiparesis ([place])   . Dysphagia 11/29/2016  . Essential hypertension 11/29/2016  . 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 Leukopenia 03/31/2016  . Hepatitis C 07/22/2013    Harrel Carina, MS, OTR/L 10/30/2017, 11:19 AM  [place] MAIN [facility] SERVICES [address] [place], [place], [address] Phone: [phone]   Fax:  [phone]  Name: Jason Nolan MRN: [id number] Date of Birth: [birth year]

## 2017-11-03 ENCOUNTER — Ambulatory Visit: Payer: Medicare Other | Attending: Internal Medicine | Admitting: Occupational Therapy

## 2017-11-03 ENCOUNTER — Encounter: Payer: Self-pay | Admitting: Occupational Therapy

## 2017-11-03 ENCOUNTER — Encounter: Payer: Self-pay | Admitting: Family Medicine

## 2017-11-03 ENCOUNTER — Ambulatory Visit (INDEPENDENT_AMBULATORY_CARE_PROVIDER_SITE_OTHER): Payer: Medicare Other | Admitting: Family Medicine

## 2017-11-03 VITALS — BP 114/58 | HR 61 | Temp 98.1°F | Ht 70.0 in | Wt 198.2 lb

## 2017-11-03 DIAGNOSIS — M6281 Muscle weakness (generalized): Secondary | ICD-10-CM | POA: Diagnosis not present

## 2017-11-03 DIAGNOSIS — R278 Other lack of coordination: Secondary | ICD-10-CM | POA: Diagnosis not present

## 2017-11-03 DIAGNOSIS — R070 Pain in throat: Secondary | ICD-10-CM

## 2017-11-03 DIAGNOSIS — G8929 Other chronic pain: Secondary | ICD-10-CM

## 2017-11-03 DIAGNOSIS — I639 Cerebral infarction, unspecified: Secondary | ICD-10-CM | POA: Diagnosis not present

## 2017-11-03 NOTE — Progress Notes (Signed)
Subjective:    Patient ID: Jason Nolan, male    DOB: 1945-08-18, 72 y.o.   MRN: [id number]  HPI This is a 72 yo male who presents today with sore throat on right side, off and on x 2 months. Saw Dr. Ermalene Searing 09/19/17 and was started on Flonase without improvement. No fever. No trouble swallowing liquids or solids, no choking, occasional difficulty if swallowing something very dry. No acid indigestion. No pattern to pain. Sometimes feels as through there is a lump and he has some tenderness on right side of lateral neck when washing his face.   Past Medical History:  Diagnosis Date  . Glaucoma   . Hepatitis C   . Stroke [facility])    Past Surgical History:  Procedure Laterality Date  . IR ANGIO INTRA EXTRACRAN SEL COM CAROTID INNOMINATE UNI L MOD SED  11/25/2016  . IR ANGIO VERTEBRAL SEL SUBCLAVIAN INNOMINATE UNI R MOD SED  11/25/2016  . IR ANGIO VERTEBRAL SEL VERTEBRAL UNI L MOD SED  11/25/2016  . IR INTRAVSC STENT CERV CAROTID W/O EMB-PROT MOD SED INC ANGIO  11/25/2016  . IR PERCUTANEOUS ART THROMBECTOMY/INFUSION INTRACRANIAL INC DIAG ANGIO  11/25/2016  . IR RADIOLOGIST EVAL & MGMT  02/13/2017  . RADIOLOGY WITH ANESTHESIA N/A 11/25/2016   Procedure: RADIOLOGY WITH ANESTHESIA;  Surgeon: Radiologist, Medication, MD;  Location: [facility] OR;  Service: Radiology;  Laterality: N/A;   Family History  Problem Relation Age of Onset  . Hypertension Mother   . Hypertension Father    Social History   Tobacco Use  . Smoking status: Former Smoker    Packs/day: 0.50    Years: 15.00    Pack years: 7.50    Last attempt to quit: 1990    Years since quitting: 29.5  . Smokeless tobacco: Never Used  Substance Use Topics  . Alcohol use: No    Alcohol/week: 0.0 oz  . Drug use: No      Review of Systems Per HPI    Objective:   Physical Exam  Constitutional: He appears well-developed and well-nourished. No distress.  HENT:  Head: Normocephalic and atraumatic.  Nose: Nose normal.  Mouth/Throat:  Oropharynx is clear and moist.  Very mild dysarthria.   Eyes: Conjunctivae are normal.  Neck: No tracheal deviation present. No thyromegaly present.    Cardiovascular: Normal rate, regular rhythm and normal heart sounds.  Pulmonary/Chest: Effort normal and breath sounds normal.  Musculoskeletal: Normal range of motion.  Lymphadenopathy:    He has no cervical adenopathy.  Skin: Skin is warm and dry. He is not diaphoretic.  Psychiatric: He has a normal mood and affect. His behavior is normal. Judgment and thought content normal.  Vitals reviewed.     BP (!) 114/58 (BP Location: Right Arm, Patient Position: Sitting, Cuff Size: Normal)   Pulse 61   Temp 98.1 F (36.7 C) (Oral)   Ht 5\' 10"  (1.778 m)   Wt 198 lb 4 oz (89.9 kg)   SpO2 96%   BMI 28.45 kg/m  Wt Readings from Last 3 Encounters:  11/03/17 198 lb 4 oz (89.9 kg)  09/19/17 197 lb 8 oz (89.6 kg)  09/09/17 198 lb 9.6 oz (90.1 kg)       Assessment & Plan:  1. Chronic throat pain - given persistence of symptoms and tenderness, will refer to ENT, patient agreeable - he was instructed to notify office if any acute changes - Ambulatory referral to ENT   Olean Ree, FNP-BC  [facility], [facility]  11/03/2017 12:05 PM

## 2017-11-03 NOTE — Therapy (Signed)
[place] [facility] MAIN [facility] SERVICES [address] [place], [place], [address] Phone: [phone]   Fax:  [phone] Occupational Therapy Progress Note  Dates of reporting period  08/25/2017   to   11/03/2017  Patient Details  Name: Jason Nolan MRN: [id number] Date of Birth: Mar 19, 1946 Referring Provider: [name]   Encounter Date: 11/03/2017  OT End of Session - 11/03/17 1401    Visit Number  52    Number of Visits  72    Date for OT Re-Evaluation  12/15/17    Authorization Type  Visit 10 of 10 for progress report period starting 08/25/2017    OT Start Time  1350    OT Stop Time  1430    OT Time Calculation (min)  40 min    Activity Tolerance  Patient tolerated treatment well    Behavior During Therapy  [facility] for tasks assessed/performed       Past Medical History:  Diagnosis Date  . Glaucoma   . Hepatitis C   . Stroke [facility])     Past Surgical History:  Procedure Laterality Date  . IR ANGIO INTRA EXTRACRAN SEL COM CAROTID INNOMINATE UNI L MOD SED  11/25/2016  . IR ANGIO VERTEBRAL SEL SUBCLAVIAN INNOMINATE UNI R MOD SED  11/25/2016  . IR ANGIO VERTEBRAL SEL VERTEBRAL UNI L MOD SED  11/25/2016  . IR INTRAVSC STENT CERV CAROTID W/O EMB-PROT MOD SED INC ANGIO  11/25/2016  . IR PERCUTANEOUS ART THROMBECTOMY/INFUSION INTRACRANIAL INC DIAG ANGIO  11/25/2016  . IR RADIOLOGIST EVAL & MGMT  02/13/2017  . RADIOLOGY WITH ANESTHESIA N/A 11/25/2016   Procedure: RADIOLOGY WITH ANESTHESIA;  Surgeon: Radiologist, Medication, MD;  [facility]: [facility] OR;  Service: Radiology;  Laterality: N/A;    There were no vitals filed for this visit.  Subjective Assessment - 11/03/17 1358    Subjective   Pt. continues to have throat discomfort.    Patient is accompained by:  Family member    Pertinent History  Pt. is a 72 y.o. 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 Pt. was transferred to [facility]. He underwent CTA brain showed short segment near occlusion  of proximal R-ICA with associated intraluminal thrombus --likely due to dissection and emergent large vessel occlusion with right M2 occlusion and evolving right frontal lobe infarct.  He underwent cerebral angio with  complete revascularization of occluded superior division of R-MCA and near complete revascularization of proximal R-ICA with stent assisted angioplasty.  Stroke felt to be embolic in setting of ruptured plaque R-ICA and he was placed on ASA and brillinta due to stent. Pt. was transferred to inpatient rehab for several weeks, had home health therapy upon discharge, and now is ready for outpatient therapy services.    Limitations  Dominant LUE functioning, left hand edema, distal ROM, coordination, and overal strength.    Currently in Pain?  No/denies       OT TREATMENT    Neuro muscular re-education:  Pt. performed [facility] skills training to improve speed and dexterity needed for ADL tasks and writing. Pt. demonstrated grasping 1 inch sticks,  inch cylindrical collars on the [employer] pegboard. Pt. performed grasping each item with his 2nd digit, and thumb.                      OT Education - 11/03/17 1400    Education provided  Yes    Education Details  [facility],     Person(s)  Educated  Patient    Methods  Explanation;Demonstration    Comprehension  Verbalized understanding;Returned demonstration          OT Long Term Goals - 11/03/17 1429      OT LONG TERM GOAL #3   Title  Pt. left hand grip with  increase by 5# in preparation for opening a jar.    Baseline  11/03/2017: improving    Time  12    Period  Weeks    Status  On-going    Target Date  12/15/17      OT LONG TERM GOAL #6   Title  Pt. will improve left hand coordination skills to be able to button clothing.    Baseline  11/03/2017: Pt. has improved with left hand coordination skills, continues to have difficulty with buttoning.    Time  2    Period  Weeks    Status  On-going    Target Date   12/15/17      OT LONG TERM GOAL #8   Title  Pt. will increase Left pinch strength to be able to grasp and hold items during ADL tasks.    Baseline  11/03/2017: Pt. continues to progress however pinch strength, and ability to grasp, and hold objects.    Time  12    Period  Weeks    Status  On-going    Target Date  12/15/17      OT LONG TERM GOAL  #9   Baseline  Pt. will write one sentence with 75% legibility and efficiently while maintaing grasp on pen 100% of the time.    Time  12    Period  Weeks    Status  On-going    Target Date  12/15/17      OT LONG TERM GOAL  #10   TITLE  Pt. will independently use his left hand to grasp and efficiently accomodate to the varying weights of ADL objects during self care tasks.     Baseline  11/03/2017: Pt. conitnues to have difficulty grasping and adjusting hand to the varying weights of objects.    Time  12    Period  Weeks    Status  On-going    Target Date  12/15/17            Plan - 11/03/17 1402    Clinical Impression Statement  Pt. had a follow up appointment with his physician about his throat. Pt. was referred to an ENT. Pt. continues to work on improving LUE strength, motor control, and [facility] skills. Pt. continued to work on improving translatory movements of the hand.     Occupational performance deficits (Please refer to evaluation for details):  ADL's;IADL's    Rehab Potential  Excellent    OT Frequency  2x / week    OT Duration  12 weeks    OT Treatment/Interventions  Self-care/ADL training;Therapeutic exercise;Neuromuscular education;Patient/family education;Energy conservation;Therapeutic activities;DME and/or AE instruction    Clinical Decision Making  Several treatment options, min-mod task modification necessary    Consulted and Agree with Plan of Care  Patient       Patient will benefit from skilled therapeutic intervention in order to improve the following deficits and impairments:  Abnormal gait, Pain, Decreased  strength, Decreased range of motion, Impaired UE functional use, Decreased coordination, Decreased knowledge of use of DME, Decreased activity tolerance, Difficulty walking, Decreased balance, Other (comment)  Visit Diagnosis: Other lack of coordination    Problem List Patient Active  Problem List   Diagnosis Date Noted  . 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 Sore throat 09/19/2017  . Low vitamin B12 level 06/27/2017  . 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 Pure hypercholesterolemia 05/28/2017  . Erectile dysfunction 05/28/2017  . Left hemiparesis ([facility])   . Dysphagia 11/29/2016  . Essential hypertension 11/29/2016  . 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 Leukopenia 03/31/2016  . Hepatitis C 07/22/2013    Olegario Messier, MS, OTR/L 11/03/2017, 2:33 PM  [facility] MAIN [facility] SERVICES [address] [place], [place], [address] Phone: [phone]   Fax:  [phone]  Name: Jason Nolan MRN: [id number] Date of Birth: 10-13-1945

## 2017-11-03 NOTE — Patient Instructions (Signed)
Good to see you today  Please stop at the front desk to get an appointment with the ear, nose and throat specialist

## 2017-11-05 ENCOUNTER — Ambulatory Visit: Payer: Medicare Other | Admitting: Occupational Therapy

## 2017-11-05 ENCOUNTER — Encounter: Payer: Self-pay | Admitting: Occupational Therapy

## 2017-11-05 DIAGNOSIS — M6281 Muscle weakness (generalized): Secondary | ICD-10-CM

## 2017-11-05 DIAGNOSIS — R278 Other lack of coordination: Secondary | ICD-10-CM | POA: Diagnosis not present

## 2017-11-05 NOTE — Therapy (Signed)
[place] [facility] [address] [place], [place], [address] Phone: [phone]   Fax:  [phone]  Occupational Therapy Treatment  Patient Details  Name: Jason CroakJames Nolan MRN: [id number] Date of Birth: June 22, 1945 Referring Provider: Lorre MunroeBAITY, REGINA W   Encounter Date: 11/05/2017  OT End of Session - 11/05/17 1450    Visit Number  52    Number of Visits  72    Date for OT Re-Evaluation  12/15/17    Authorization Type  Visit 1 of 10 for progress report period starting 08/25/2017    OT Start Time  1430    OT Stop Time  1515    OT Time Calculation (min)  45 min    Activity Tolerance  Patient tolerated treatment well    Behavior During Therapy  [facility] for tasks assessed/performed       Past Medical History:  Diagnosis Date  . Glaucoma   . Hepatitis C   . Stroke [facility](HCC)     Past Surgical History:  Procedure Laterality Date  . IR ANGIO INTRA EXTRACRAN SEL COM CAROTID INNOMINATE UNI L MOD SED  11/25/2016  . IR ANGIO VERTEBRAL SEL SUBCLAVIAN INNOMINATE UNI R MOD SED  11/25/2016  . IR ANGIO VERTEBRAL SEL VERTEBRAL UNI L MOD SED  11/25/2016  . IR INTRAVSC STENT CERV CAROTID W/O EMB-PROT MOD SED INC ANGIO  11/25/2016  . IR PERCUTANEOUS ART THROMBECTOMY/INFUSION INTRACRANIAL INC DIAG ANGIO  11/25/2016  . IR RADIOLOGIST EVAL & MGMT  02/13/2017  . RADIOLOGY WITH ANESTHESIA N/A 11/25/2016   Procedure: RADIOLOGY WITH ANESTHESIA;  Surgeon: Radiologist, Medication, MD;  Location: [facility] OR;  Service: Radiology;  Laterality: N/A;    There were no vitals filed for this visit.  Subjective Assessment - 11/05/17 1437    Subjective   Pt. continues to have throat discomfort.    Patient is accompained by:  Family member    Pertinent History  Pt. is a 72 y.o. 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 [facility] He underwent CTA brain showed short segment near occlusion of proximal R-ICA with associated intraluminal thrombus  --likely due to dissection and emergent large vessel occlusion with right M2 occlusion and evolving right frontal lobe infarct.  He underwent cerebral angio with  complete revascularization of occluded superior division of R-MCA and near complete revascularization of proximal R-ICA with stent assisted angioplasty.  Stroke felt to be embolic in setting of ruptured plaque R-ICA and he was placed on ASA and brillinta due to stent. Pt. was transferred to inpatient rehab for several weeks, had home health therapy upon discharge, and now is ready for outpatient therapy services.    Limitations  Dominant LUE functioning, left hand edema, distal ROM, coordination, and overal strength.    Patient Stated Goals  To regain the use of his LUE.    Currently in Pain?  No/denies    Pain Score  0-No pain      OT TREATMENT    Neuro muscular re-education:  Pt. Worked on grasping 1/2" flat marbles, storing them in the palm of his hand, and translatory movements of the hand moving objects from his palm to the tip of his 2nd digit to the thumb. Pt. performed [facility] tasks using the Grooved pegboard. Pt. worked on grasping the grooved pegs from a horizontal position, and moving the pegs to a vertical position in the hand to prepare for placing them in the grooved slot.  OT Education - 11/05/17 1444    Education provided  Yes    Education Details  [facility],     Person(s) Educated  Patient    Comprehension  Verbalized understanding;Returned demonstration          OT Long Term Goals - 11/03/17 1429      OT LONG TERM GOAL #3   Title  Pt. left hand grip with  increase by 5# in preparation for opening a jar.    Baseline  11/03/2017: improving    Time  12    Period  Weeks    Status  On-going    Target Date  12/15/17      OT LONG TERM GOAL #6   Title  Pt. will improve left hand coordination skills to be able to button clothing.    Baseline  11/03/2017: Pt. has improved with  left hand coordination skills, continues to have difficulty with buttoning.    Time  2    Period  Weeks    Status  On-going    Target Date  12/15/17      OT LONG TERM GOAL #8   Title  Pt. will increase Left pinch strength to be able to grasp and hold items during ADL tasks.    Baseline  11/03/2017: Pt. continues to progress however pinch strength, and ability to grasp, and hold objects.    Time  12    Period  Weeks    Status  On-going    Target Date  12/15/17      OT LONG TERM GOAL  #9   Baseline  Pt. will write one sentence with 75% legibility and efficiently while maintaing grasp on pen 100% of the time.    Time  12    Period  Weeks    Status  On-going    Target Date  12/15/17      OT LONG TERM GOAL  #10   TITLE  Pt. will independently use his left hand to grasp and efficiently accomodate to the varying weights of ADL objects during self care tasks.     Baseline  11/03/2017: Pt. conitnues to have difficulty grasping and adjusting hand to the varying weights of objects.    Time  12    Period  Weeks    Status  On-going    Target Date  12/15/17            Plan - 11/05/17 1456    Clinical Impression Statement  Pt. reports that he has an ENT appointment on July 15th. Pt. continues to present with limited LUE strength, and is improving with fine motor coordination skills. Pt. continues to work on improving Left hand function skills and translatory movements of the hand.    Occupational performance deficits (Please refer to evaluation for details):  ADL's;IADL's    Rehab Potential  Excellent    OT Frequency  2x / week    OT Duration  12 weeks    OT Treatment/Interventions  Self-care/ADL training;Therapeutic exercise;Neuromuscular education;Patient/family education;Energy conservation;Therapeutic activities;DME and/or AE instruction    Clinical Decision Making  Several treatment options, min-mod task modification necessary    Consulted and Agree with Plan of Care  Patient        Patient will benefit from skilled therapeutic intervention in order to improve the following deficits and impairments:  Abnormal gait, Pain, Decreased strength, Decreased range of motion, Impaired UE functional use, Decreased coordination, Decreased knowledge of use of DME, Decreased activity tolerance, Difficulty walking,  Decreased balance, Other (comment)  Visit Diagnosis: Muscle weakness (generalized)    Problem List Patient Active Problem List   Diagnosis Date Noted  . 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 Sore throat 09/19/2017  . Low vitamin B12 level 06/27/2017  . 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 Pure hypercholesterolemia 05/28/2017  . Erectile dysfunction 05/28/2017  . Left hemiparesis (HCC)   . Dysphagia 11/29/2016  . Essential hypertension 11/29/2016  . 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 Leukopenia 03/31/2016  . Hepatitis C 07/22/2013    Olegario Messier, MS, OTR/L 11/05/2017, 3:15 PM  [place] [facility] MAIN [facility] SERVICES [address] [place], [place], [address] Phone: [phone]   Fax:  [phone]  Name: Jason Nolan MRN: [id number] Date of Birth: 01/20/1946

## 2017-11-10 ENCOUNTER — Other Ambulatory Visit (HOSPITAL_COMMUNITY): Payer: Self-pay | Admitting: Interventional Radiology

## 2017-11-10 DIAGNOSIS — I771 Stricture of artery: Secondary | ICD-10-CM

## 2017-11-11 ENCOUNTER — Ambulatory Visit: Payer: Medicare Other | Admitting: Occupational Therapy

## 2017-11-11 ENCOUNTER — Encounter: Payer: Self-pay | Admitting: Occupational Therapy

## 2017-11-11 DIAGNOSIS — M6281 Muscle weakness (generalized): Secondary | ICD-10-CM

## 2017-11-11 DIAGNOSIS — R278 Other lack of coordination: Secondary | ICD-10-CM | POA: Diagnosis not present

## 2017-11-11 NOTE — Therapy (Signed)
[place] [facility] MAIN [facility] SERVICES [address] [place], [place], [address] Phone: [phone]   Fax:  [phone]  Occupational Therapy Treatment  Patient Details  Name: Jason Nolan MRN: [id number] Date of Birth: 04-05-1946 Referring Provider: [name]   Encounter Date: 11/11/2017  OT End of Session - 11/11/17 1128    Visit Number  53    Number of Visits  72    Date for OT Re-Evaluation  12/15/17    Authorization Type  Visit 2 of 10 for progress report period starting 08/25/2017    OT Start Time  1116    OT Stop Time  1200    OT Time Calculation (min)  44 min    Activity Tolerance  Patient tolerated treatment well    Behavior During Therapy  [employer] for tasks assessed/performed       Past Medical History:  Diagnosis Date  . Glaucoma   . Hepatitis C   . Stroke [facility])     Past Surgical History:  Procedure Laterality Date  . IR ANGIO INTRA EXTRACRAN SEL COM CAROTID INNOMINATE UNI L MOD SED  11/25/2016  . IR ANGIO VERTEBRAL SEL SUBCLAVIAN INNOMINATE UNI R MOD SED  11/25/2016  . IR ANGIO VERTEBRAL SEL VERTEBRAL UNI L MOD SED  11/25/2016  . IR INTRAVSC STENT CERV CAROTID W/O EMB-PROT MOD SED INC ANGIO  11/25/2016  . IR PERCUTANEOUS ART THROMBECTOMY/INFUSION INTRACRANIAL INC DIAG ANGIO  11/25/2016  . IR RADIOLOGIST EVAL & MGMT  02/13/2017  . RADIOLOGY WITH ANESTHESIA N/A 11/25/2016   Procedure: RADIOLOGY WITH ANESTHESIA;  Surgeon: Radiologist, Medication, MD;  Location: [facility] OR;  Service: Radiology;  Laterality: N/A;    There were no vitals filed for this visit.  Subjective Assessment - 11/11/17 1123    Subjective   Pt. has an appointment with an ENT on Monday.    Patient is accompained by:  Family member    Pertinent History  Pt. is a 72 y.o. 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 Pt. was transferred to [facility]. He underwent CTA brain showed short segment near occlusion of proximal R-ICA with associated intraluminal thrombus  --likely due to dissection and emergent large vessel occlusion with right M2 occlusion and evolving right frontal lobe infarct.  He underwent cerebral angio with  complete revascularization of occluded superior division of R-MCA and near complete revascularization of proximal R-ICA with stent assisted angioplasty.  Stroke felt to be embolic in setting of ruptured plaque R-ICA and he was placed on ASA and brillinta due to stent. Pt. was transferred to inpatient rehab for several weeks, had home health therapy upon discharge, and now is ready for outpatient therapy services.    Limitations  Dominant LUE functioning, left hand edema, distal ROM, coordination, and overal strength.    Patient Stated Goals  To regain the use of his LUE.    Currently in Pain?  No/denies      OT TREATMENT    Neuro muscular re-education:  Pt. Pt. worked on grasping 1/2" flat marbles. Pt worked on moving the marbles through his hand to store them in the palm. Pt. worked on translatory movements moving the objects from his palm to the tip of his 2nd digit, and thumb. Emphasis was placed on motion, and movements. Pt. worked on bilateral [facility] skills needed to grasp small resistive beads. Pt. worked on connecting the beads using a 3pt. pinch, and pt. Pinch grasp. Pt. worked on disconnecting the resistive beads  using a lateral pinch grasp, and 3pt. pinch grasp.         [facility] OT Assessment - 11/11/17 1150      Coordination   Left 9 Hole Peg Test  1 min & 30      Hand Function   Left Hand Grip (lbs)  25#    Left Hand Lateral Pinch  12 lbs    Left 3 point pinch  8 lbs                       OT Education - 11/11/17 1123    Education provided  Yes    Education Details  [facility],     Person(s) Educated  Patient    Methods  Explanation;Demonstration    Comprehension  Verbalized understanding;Returned demonstration          OT Long Term Goals - 11/03/17 1429      OT LONG TERM GOAL #3   Title  Pt. left  hand grip with  increase by 5# in preparation for opening a jar.    Baseline  11/03/2017: improving    Time  12    Period  Weeks    Status  On-going    Target Date  12/15/17      OT LONG TERM GOAL #6   Title  Pt. will improve left hand coordination skills to be able to button clothing.    Baseline  11/03/2017: Pt. has improved with left hand coordination skills, continues to have difficulty with buttoning.    Time  2    Period  Weeks    Status  On-going    Target Date  12/15/17      OT LONG TERM GOAL #8   Title  Pt. will increase Left pinch strength to be able to grasp and hold items during ADL tasks.    Baseline  11/03/2017: Pt. continues to progress however pinch strength, and ability to grasp, and hold objects.    Time  12    Period  Weeks    Status  On-going    Target Date  12/15/17      OT LONG TERM GOAL  #9   Baseline  Pt. will write one sentence with 75% legibility and efficiently while maintaing grasp on pen 100% of the time.    Time  12    Period  Weeks    Status  On-going    Target Date  12/15/17      OT LONG TERM GOAL  #10   TITLE  Pt. will independently use his left hand to grasp and efficiently accomodate to the varying weights of ADL objects during self care tasks.     Baseline  11/03/2017: Pt. conitnues to have difficulty grasping and adjusting hand to the varying weights of objects.    Time  12    Period  Weeks    Status  On-going    Target Date  12/15/17            Plan - 11/11/17 1128    Clinical Impression Statement  Pt. reports that his right hand seems stiffer. Pt. continues to work on improving LUE hand function, fine motor coordination skills for improved ADL, and IADL tasks.     Occupational performance deficits (Please refer to evaluation for details):  ADL's;IADL's    Rehab Potential  Excellent    OT Frequency  2x / week    OT Duration  12 weeks  OT Treatment/Interventions  Self-care/ADL training;Therapeutic exercise;Neuromuscular  education;Patient/family education;Energy conservation;Therapeutic activities;DME and/or AE instruction    Clinical Decision Making  Several treatment options, min-mod task modification necessary    Consulted and Agree with Plan of Care  Patient       Patient will benefit from skilled therapeutic intervention in order to improve the following deficits and impairments:  Abnormal gait, Pain, Decreased strength, Decreased range of motion, Impaired UE functional use, Decreased coordination, Decreased knowledge of use of DME, Decreased activity tolerance, Difficulty walking, Decreased balance, Other (comment)  Visit Diagnosis: Muscle weakness (generalized)  Other lack of coordination    Problem List Patient Active Problem List   Diagnosis Date Noted  . 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 Sore throat 09/19/2017  . Low vitamin B12 level 06/27/2017  . 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 Pure hypercholesterolemia 05/28/2017  . Erectile dysfunction 05/28/2017  . Left hemiparesis (HCC)   . Dysphagia 11/29/2016  . Essential hypertension 11/29/2016  . 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 Leukopenia 03/31/2016  . Hepatitis C 07/22/2013    Olegario Messier, MS, OTR/L 11/11/2017, 12:04 PM  [facility] MAIN [facility] SERVICES [address] [place], [place], [address] Phone: [phone]   Fax:  [phone]  Name: Jason Nolan MRN: [id number] Date of Birth: 04-20-1946

## 2017-11-13 ENCOUNTER — Ambulatory Visit: Payer: Medicare Other | Admitting: Occupational Therapy

## 2017-11-13 ENCOUNTER — Encounter: Payer: Self-pay | Admitting: Occupational Therapy

## 2017-11-13 DIAGNOSIS — M6281 Muscle weakness (generalized): Secondary | ICD-10-CM | POA: Diagnosis not present

## 2017-11-13 DIAGNOSIS — R278 Other lack of coordination: Secondary | ICD-10-CM

## 2017-11-13 NOTE — Therapy (Signed)
[place] [facility] MAIN [facility] SERVICES [address] [place], [place], [address] Phone: [phone]   Fax:  [phone]  Occupational Therapy Treatment  Patient Details  Name: Jason Nolan MRN: [id number] Date of Birth: [birth year] Referring Provider: [name]   Encounter Date: 11/13/2017  OT End of Session - 11/13/17 1037    Visit Number  54    Number of Visits  72    Date for OT Re-Evaluation  12/15/17    Authorization Type  Visit 3 of 10 for progress report period starting 08/25/2017    OT Start Time  1015    OT Stop Time  1100    OT Time Calculation (min)  45 min    Activity Tolerance  Patient tolerated treatment well    Behavior During Therapy  [facility] for tasks assessed/performed       Past Medical History:  Diagnosis Date  . Glaucoma   . Hepatitis C   . Stroke [facility])     Past Surgical History:  Procedure Laterality Date  . IR ANGIO INTRA EXTRACRAN SEL COM CAROTID INNOMINATE UNI L MOD SED  11/25/2016  . IR ANGIO VERTEBRAL SEL SUBCLAVIAN INNOMINATE UNI R MOD SED  11/25/2016  . IR ANGIO VERTEBRAL SEL VERTEBRAL UNI L MOD SED  11/25/2016  . IR INTRAVSC STENT CERV CAROTID W/O EMB-PROT MOD SED INC ANGIO  11/25/2016  . IR PERCUTANEOUS ART THROMBECTOMY/INFUSION INTRACRANIAL INC DIAG ANGIO  11/25/2016  . IR RADIOLOGIST EVAL & MGMT  02/13/2017  . RADIOLOGY WITH ANESTHESIA N/A 11/25/2016   Procedure: RADIOLOGY WITH ANESTHESIA;  Surgeon: Radiologist, Medication, MD;  Location: [facility] OR;  Service: Radiology;  Laterality: N/A;    There were no vitals filed for this visit.  Subjective Assessment - 11/13/17 1028    Subjective   Pt. reports that his left hand is numb.    Patient is accompained by:  Family member    Pertinent History  Pt. is a 72 y.o. 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 Pt. was transferred to [facility]. He underwent CTA brain showed short segment near occlusion of proximal R-ICA with associated intraluminal thrombus  --likely due to dissection and emergent large vessel occlusion with right M2 occlusion and evolving right frontal lobe infarct.  He underwent cerebral angio with  complete revascularization of occluded superior division of R-MCA and near complete revascularization of proximal R-ICA with stent assisted angioplasty.  Stroke felt to be embolic in setting of ruptured plaque R-ICA and he was placed on ASA and brillinta due to stent. Pt. was transferred to inpatient rehab for several weeks, had home health therapy upon discharge, and now is ready for outpatient therapy services.    Limitations  Dominant LUE functioning, left hand edema, distal ROM, coordination, and overal strength.    Currently in Pain?  No/denies      OT TREATMENT    Neuro muscular re-education:  Pt. worked on hand function skills working on rapid alternating hand patterns with the left hand using thumb on fingers, and fingers on thumb movement patterns. Pt. worked on increasing speed as the task progressed. Pt. worked on activities to improve his ability to accommodate, and adjust to the varying sizes, weights, and contexts of objects. Pt. worked on Mining engineer. Pt. had the most success when working with a pen with a medium sized grip. Pt. was provided with a yellow foam grip to use on standard pens, and pencils at home.  OT Education - 11/13/17 1033    Education provided  Yes    Education Details  [facility],     Person(s) Educated  Patient    Methods  Explanation;Demonstration    Comprehension  Verbalized understanding;Returned demonstration          OT Long Term Goals - 11/03/17 1429      OT LONG TERM GOAL #3   Title  Pt. left hand grip with  increase by 5# in preparation for opening a jar.    Baseline  11/03/2017: improving    Time  12    Period  Weeks    Status  On-going    Target Date  12/15/17      OT LONG TERM GOAL #6   Title  Pt. will improve left hand coordination  skills to be able to button clothing.    Baseline  11/03/2017: Pt. has improved with left hand coordination skills, continues to have difficulty with buttoning.    Time  2    Period  Weeks    Status  On-going    Target Date  12/15/17      OT LONG TERM GOAL #8   Title  Pt. will increase Left pinch strength to be able to grasp and hold items during ADL tasks.    Baseline  11/03/2017: Pt. continues to progress however pinch strength, and ability to grasp, and hold objects.    Time  12    Period  Weeks    Status  On-going    Target Date  12/15/17      OT LONG TERM GOAL  #9   Baseline  Pt. will write one sentence with 75% legibility and efficiently while maintaing grasp on pen 100% of the time.    Time  12    Period  Weeks    Status  On-going    Target Date  12/15/17      OT LONG TERM GOAL  #10   TITLE  Pt. will independently use his left hand to grasp and efficiently accomodate to the varying weights of ADL objects during self care tasks.     Baseline  11/03/2017: Pt. conitnues to have difficulty grasping and adjusting hand to the varying weights of objects.    Time  12    Period  Weeks    Status  On-going    Target Date  12/15/17            Plan - 11/13/17 1038    Clinical Impression Statement Pt. is making progress overall. Pt. is now able to use his left hand to open a door knob, and unlatch the door. Pt. has more control when reaching for a nightlight switch located above his oven. Pt. continues to have difficulty with hand function skills accommodating, and adjusting his hand to the weights, and context of objects. Pt. continues to present with limited UE strength, and [facility] skills.     Occupational performance deficits (Please refer to evaluation for details):  ADL's;IADL's    Rehab Potential  Excellent    OT Frequency  2x / week    OT Duration  12 weeks    OT Treatment/Interventions  Self-care/ADL training;Therapeutic exercise;Neuromuscular education;Patient/family  education;Energy conservation;Therapeutic activities;DME and/or AE instruction    Clinical Decision Making  Several treatment options, min-mod task modification necessary       Patient will benefit from skilled therapeutic intervention in order to improve the following deficits and impairments:  Abnormal gait, Pain, Decreased strength, Decreased  range of motion, Impaired UE functional use, Decreased coordination, Decreased knowledge of use of DME, Decreased activity tolerance, Difficulty walking, Decreased balance, Other (comment)  Visit Diagnosis: Muscle weakness (generalized)  Other lack of coordination    Problem List Patient Active Problem List   Diagnosis Date Noted  . 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 Sore throat 09/19/2017  . Low vitamin B12 level 06/27/2017  . 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 Pure hypercholesterolemia 05/28/2017  . Erectile dysfunction 05/28/2017  . Left hemiparesis (HCC)   . Dysphagia 11/29/2016  . Essential hypertension 11/29/2016  . 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 Leukopenia 03/31/2016  . Hepatitis C 07/22/2013    Olegario Messier, MS, OTR/L 11/13/2017, 10:58 AM  [facility] MAIN [facility] SERVICES [address] [place], [place], [address] Phone: [phone]   Fax:  [phone]  Name: Jason Nolan MRN: [id number] Date of Birth: Oct 20, 1945

## 2017-11-17 DIAGNOSIS — K219 Gastro-esophageal reflux disease without esophagitis: Secondary | ICD-10-CM | POA: Diagnosis not present

## 2017-11-17 DIAGNOSIS — F458 Other somatoform disorders: Secondary | ICD-10-CM | POA: Diagnosis not present

## 2017-11-18 ENCOUNTER — Encounter: Payer: Self-pay | Admitting: Occupational Therapy

## 2017-11-18 ENCOUNTER — Ambulatory Visit: Payer: Medicare Other | Admitting: Occupational Therapy

## 2017-11-18 DIAGNOSIS — M6281 Muscle weakness (generalized): Secondary | ICD-10-CM | POA: Diagnosis not present

## 2017-11-18 DIAGNOSIS — R278 Other lack of coordination: Secondary | ICD-10-CM

## 2017-11-18 NOTE — Therapy (Addendum)
[place] [facility] MAIN [facility] SERVICES [address] [place], [place], [address] Phone: [phone]   Fax:  [phone]  Occupational Therapy Treatment  Patient Details  Name: Jason Nolan MRN: [id number] Date of Birth: 11/29/1945 Referring Provider: [name]   Encounter Date: 11/18/2017  OT End of Session - 11/18/17 1026    Visit Number  55    Number of Visits  72    Date for OT Re-Evaluation  12/15/17    Authorization Type  Visit 4 of 10 for progress report period starting 08/25/2017    OT Start Time  1015    OT Stop Time  1100    OT Time Calculation (min)  45 min    Activity Tolerance  Patient tolerated treatment well    Behavior During Therapy  [facility] for tasks assessed/performed       Past Medical History:  Diagnosis Date  . Glaucoma   . Hepatitis C   . Stroke [facility])     Past Surgical History:  Procedure Laterality Date  . IR ANGIO INTRA EXTRACRAN SEL COM CAROTID INNOMINATE UNI L MOD SED  11/25/2016  . IR ANGIO VERTEBRAL SEL SUBCLAVIAN INNOMINATE UNI R MOD SED  11/25/2016  . IR ANGIO VERTEBRAL SEL VERTEBRAL UNI L MOD SED  11/25/2016  . IR INTRAVSC STENT CERV CAROTID W/O EMB-PROT MOD SED INC ANGIO  11/25/2016  . IR PERCUTANEOUS ART THROMBECTOMY/INFUSION INTRACRANIAL INC DIAG ANGIO  11/25/2016  . IR RADIOLOGIST EVAL & MGMT  02/13/2017  . RADIOLOGY WITH ANESTHESIA N/A 11/25/2016   Procedure: RADIOLOGY WITH ANESTHESIA;  Surgeon: Radiologist, Medication, MD;  [facility]: [facility] OR;  Service: Radiology;  Laterality: N/A;    There were no vitals filed for this visit.  Subjective Assessment - 11/18/17 1024    Subjective   Pt. reports that his left hand is numb.    Patient is accompained by:  Family member    Pertinent History  Pt. is a 72 y.o. 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 Pt. was transferred to [facility]. He underwent CTA brain showed short segment near occlusion of proximal R-ICA with associated intraluminal thrombus  --likely due to dissection and emergent large vessel occlusion with right M2 occlusion and evolving right frontal lobe infarct.  He underwent cerebral angio with  complete revascularization of occluded superior division of R-MCA and near complete revascularization of proximal R-ICA with stent assisted angioplasty.  Stroke felt to be embolic in setting of ruptured plaque R-ICA and he was placed on ASA and brillinta due to stent. Pt. was transferred to inpatient rehab for several weeks, had home health therapy upon discharge, and now is ready for outpatient therapy services.    Limitations  Dominant LUE functioning, left hand edema, distal ROM, coordination, and overal strength.    Patient Stated Goals  To regain the use of his LUE.    Currently in Pain?  No/denies      OT TREATMENT    Neuro muscular re-education:  Pt. worked on grasping 1" resistive cubes alternating thumb opposition to the tip of the 2nd through 5th digits while the board is placed at a vertical angle. Pt. worked on pressing the cubes back into place while alternating isolated 2nd through 5th digit extension. Pt. worked on translatory movements of the hand grasping 1/2" marbles, and working on moving them through his hand with emphasis on thumb movement patterns pushing the marbles through the palm of his hand towards the tip of his  thnub, and 2nd digit. Pt. Worked on writing tasks with his left hand, however had to reposition the pen in his hand multiple times secondary to increasing tone during the task. Pt. required increased time to formulate the letters, and words. Pt. Presented with limited movement distally during the writing process. Pt. Was able to write 5 words (months) in 2 minutes.                          OT Education - 11/18/17 1026    Education provided  Yes    Education Details  [facility],     Person(s) Educated  Patient    Methods  Explanation;Demonstration    Comprehension  Verbalized  understanding;Returned demonstration          OT Long Term Goals - 11/03/17 1429      OT LONG TERM GOAL #3   Title  Pt. left hand grip with  increase by 5# in preparation for opening a jar.    Baseline  11/03/2017: improving    Time  12    Period  Weeks    Status  On-going    Target Date  12/15/17      OT LONG TERM GOAL #6   Title  Pt. will improve left hand coordination skills to be able to button clothing.    Baseline  11/03/2017: Pt. has improved with left hand coordination skills, continues to have difficulty with buttoning.    Time  2    Period  Weeks    Status  On-going    Target Date  12/15/17      OT LONG TERM GOAL #8   Title  Pt. will increase Left pinch strength to be able to grasp and hold items during ADL tasks.    Baseline  11/03/2017: Pt. continues to progress however pinch strength, and ability to grasp, and hold objects.    Time  12    Period  Weeks    Status  On-going    Target Date  12/15/17      OT LONG TERM GOAL  #9   Baseline  Pt. will write one sentence with 75% legibility and efficiently while maintaing grasp on pen 100% of the time.    Time  12    Period  Weeks    Status  On-going    Target Date  12/15/17      OT LONG TERM GOAL  #10   TITLE  Pt. will independently use his left hand to grasp and efficiently accomodate to the varying weights of ADL objects during self care tasks.     Baseline  11/03/2017: Pt. conitnues to have difficulty grasping and adjusting hand to the varying weights of objects.    Time  12    Period  Weeks    Status  On-going    Target Date  12/15/17            Plan - 11/18/17 1027    Clinical Impression Statement  Pt. reports having had an ENT appointment yesterday. Pt. continues to work on improving  LUE hand function skills. Pt. continues to focus on translatory movements of the left hand, with emphasis on thumb movements. Pt.continues to work on improving with writing speed, legibility, and pen grasp. Pt. was provided  with information about the [facility].   Occupational performance deficits (Please refer to evaluation for details):  ADL's;IADL's    Rehab Potential  Excellent  OT Frequency  2x / week    OT Duration  12 weeks    OT Treatment/Interventions  Self-care/ADL training;Therapeutic exercise;Neuromuscular education;Patient/family education;Energy conservation;Therapeutic activities;DME and/or AE instruction    Clinical Decision Making  Several treatment options, min-mod task modification necessary    Consulted and Agree with Plan of Care  Patient       Patient will benefit from skilled therapeutic intervention in order to improve the following deficits and impairments:  Abnormal gait, Pain, Decreased strength, Decreased range of motion, Impaired UE functional use, Decreased coordination, Decreased knowledge of use of DME, Decreased activity tolerance, Difficulty walking, Decreased balance, Other (comment)  Visit Diagnosis: Muscle weakness (generalized)  Other lack of coordination    Problem List Patient Active Problem List   Diagnosis Date Noted  . 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 Sore throat 09/19/2017  . Low vitamin B12 level 06/27/2017  . 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 Pure hypercholesterolemia 05/28/2017  . Erectile dysfunction 05/28/2017  . Left hemiparesis (HCC)   . Dysphagia 11/29/2016  . Essential hypertension 11/29/2016  . 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 Leukopenia 03/31/2016  . Hepatitis C 07/22/2013    Olegario Messier, MS, OTR/L 11/18/2017, 10:46 AM  [facility] MAIN [facility] SERVICES [address] [place], [place], [address] Phone: [phone]   Fax:  [phone]  Name: Jason Nolan MRN: [id number] Date of Birth: May 15, 1945

## 2017-11-19 ENCOUNTER — Other Ambulatory Visit: Payer: Self-pay | Admitting: Internal Medicine

## 2017-11-19 DIAGNOSIS — I10 Essential (primary) hypertension: Secondary | ICD-10-CM

## 2017-11-20 DIAGNOSIS — H401112 Primary open-angle glaucoma, right eye, moderate stage: Secondary | ICD-10-CM | POA: Diagnosis not present

## 2017-11-20 DIAGNOSIS — H401121 Primary open-angle glaucoma, left eye, mild stage: Secondary | ICD-10-CM | POA: Diagnosis not present

## 2017-11-21 ENCOUNTER — Ambulatory Visit: Payer: Medicare Other | Admitting: Occupational Therapy

## 2017-11-21 DIAGNOSIS — R278 Other lack of coordination: Secondary | ICD-10-CM

## 2017-11-21 DIAGNOSIS — M6281 Muscle weakness (generalized): Secondary | ICD-10-CM | POA: Diagnosis not present

## 2017-11-22 ENCOUNTER — Encounter: Payer: Self-pay | Admitting: Occupational Therapy

## 2017-11-22 NOTE — Therapy (Signed)
[place] [facility] MAIN [facility] SERVICES [address] [place], [place], [address] Phone: [phone]   Fax:  [phone]  Occupational Therapy Treatment  Patient Details  Name: Jason CroakJames Nolan MRN: [id number] Date of Birth: 04-09-1946 Referring Provider: Lorre MunroeBAITY, REGINA W   Encounter Date: 11/21/2017  OT End of Session - 11/22/17 1155    Visit Number  56    Number of Visits  72    Date for OT Re-Evaluation  12/15/17    Authorization Type  Visit 5 of 10 for progress report period starting 08/25/2017    OT Start Time  0928    OT Stop Time  0957    OT Time Calculation (min)  29 min    Activity Tolerance  Patient tolerated treatment well    Behavior During Therapy  [facility] for tasks assessed/performed       Past Medical History:  Diagnosis Date  . Glaucoma   . Hepatitis C   . Stroke [facility](HCC)     Past Surgical History:  Procedure Laterality Date  . IR ANGIO INTRA EXTRACRAN SEL COM CAROTID INNOMINATE UNI L MOD SED  11/25/2016  . IR ANGIO VERTEBRAL SEL SUBCLAVIAN INNOMINATE UNI R MOD SED  11/25/2016  . IR ANGIO VERTEBRAL SEL VERTEBRAL UNI L MOD SED  11/25/2016  . IR INTRAVSC STENT CERV CAROTID W/O EMB-PROT MOD SED INC ANGIO  11/25/2016  . IR PERCUTANEOUS ART THROMBECTOMY/INFUSION INTRACRANIAL INC DIAG ANGIO  11/25/2016  . IR RADIOLOGIST EVAL & MGMT  02/13/2017  . RADIOLOGY WITH ANESTHESIA N/A 11/25/2016   Procedure: RADIOLOGY WITH ANESTHESIA;  Surgeon: Radiologist, Medication, MD;  Location: [facility] OR;  Service: Radiology;  Laterality: N/A;    There were no vitals filed for this visit.  Subjective Assessment - 11/22/17 1153    Subjective   Patient reports he needs to leave early today, has another appt to go to today.  Reports he still has difficulty with engaging and using the ulnar side of the hand.     Pertinent History  Pt. is a 72 y.o. 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 [facility]. He underwent CTA brain showed short  segment near occlusion of proximal R-ICA with associated intraluminal thrombus --likely due to dissection and emergent large vessel occlusion with right M2 occlusion and evolving right frontal lobe infarct.  He underwent cerebral angio with  complete revascularization of occluded superior division of R-MCA and near complete revascularization of proximal R-ICA with stent assisted angioplasty.  Stroke felt to be embolic in setting of ruptured plaque R-ICA and he was placed on ASA and brillinta due to stent. Pt. was transferred to inpatient rehab for several weeks, had home health therapy upon discharge, and now is ready for outpatient therapy services.    Patient Stated Goals  To regain the use of his LUE.    Currently in Pain?  No/denies    Multiple Pain Sites  No        Instructed on home program and demonstration in the clinic of the following:  Yellow putty for gripping -take a whole piece and squeeze with the ring finger and pinkie. -roll out like a hot dog and then pinch putty between pad of the thumb and the pinkie -make meatballs with putty and place at the base of the ring and small finger and squeeze down with hook fist - Golf balls for coordination -Use one ball to roll across large knuckles of the hand palm up  using thumb to push across the hand, attempting to complete in 2 motions -one ball, roll from palm up index and middle finger and back down -one ball, roll up ring and small finger and back down  -try to roll ball up in to a perched position -try to make a perched position with fingers and then place the ball onto the left perch using right hand.   Perform 10 reps 1-2 times a day                    OT Education - 11/22/17 1154    Education provided  Yes    Education Details  home exercises with focus on ulnar side of the hand, coordination of the hand and oppositional movements    Person(s) Educated  Patient    Methods  Explanation;Demonstration     Comprehension  Verbalized understanding;Returned demonstration          OT Long Term Goals - 11/03/17 1429      OT LONG TERM GOAL #3   Title  Pt. left hand grip with  increase by 5# in preparation for opening a jar.    Baseline  11/03/2017: improving    Time  12    Period  Weeks    Status  On-going    Target Date  12/15/17      OT LONG TERM GOAL #6   Title  Pt. will improve left hand coordination skills to be able to button clothing.    Baseline  11/03/2017: Pt. has improved with left hand coordination skills, continues to have difficulty with buttoning.    Time  2    Period  Weeks    Status  On-going    Target Date  12/15/17      OT LONG TERM GOAL #8   Title  Pt. will increase Left pinch strength to be able to grasp and hold items during ADL tasks.    Baseline  11/03/2017: Pt. continues to progress however pinch strength, and ability to grasp, and hold objects.    Time  12    Period  Weeks    Status  On-going    Target Date  12/15/17      OT LONG TERM GOAL  #9   Baseline  Pt. will write one sentence with 75% legibility and efficiently while maintaing grasp on pen 100% of the time.    Time  12    Period  Weeks    Status  On-going    Target Date  12/15/17      OT LONG TERM GOAL  #10   TITLE  Pt. will independently use his left hand to grasp and efficiently accomodate to the varying weights of ADL objects during self care tasks.     Baseline  11/03/2017: Pt. conitnues to have difficulty grasping and adjusting hand to the varying weights of objects.    Time  12    Period  Weeks    Status  On-going    Target Date  12/15/17            Plan - 11/22/17 1155    Clinical Impression Statement  Targeted exercises for coordination of the hand and emphasis on the ulnar side of the hand, coordination between the 2 sides of the hand.  Instruction with written program for exercises at home with use of golf ball and putty to help strength ulnar side and coordination of the 2 sides  of the hand separately  and together.     Occupational performance deficits (Please refer to evaluation for details):  ADL's;IADL's    Rehab Potential  Excellent    OT Frequency  2x / week    OT Duration  12 weeks    OT Treatment/Interventions  Self-care/ADL training;Therapeutic exercise;Neuromuscular education;Patient/family education;Energy conservation;Therapeutic activities;DME and/or AE instruction    Consulted and Agree with Plan of Care  Patient       Patient will benefit from skilled therapeutic intervention in order to improve the following deficits and impairments:  Abnormal gait, Pain, Decreased strength, Decreased range of motion, Impaired UE functional use, Decreased coordination, Decreased knowledge of use of DME, Decreased activity tolerance, Difficulty walking, Decreased balance, Other (comment)  Visit Diagnosis: Muscle weakness (generalized)  Other lack of coordination    Problem List Patient Active Problem List   Diagnosis Date Noted  . 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 Sore throat 09/19/2017  . Low vitamin B12 level 06/27/2017  . 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 Pure hypercholesterolemia 05/28/2017  . Erectile dysfunction 05/28/2017  . Left hemiparesis (HCC)   . Dysphagia 11/29/2016  . Essential hypertension 11/29/2016  . 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 Leukopenia 03/31/2016  . Hepatitis C 07/22/2013   Katrinka Herbison T Arne Cleveland, OTR/L, CLT  Keshawn Sundberg 11/22/2017, 11:59 AM  [place] [facility] MAIN [facility] SERVICES [address] [place], [place], [address] Phone: [phone]   Fax:  [phone]  Name: Tremel Setters MRN: [id number] Date of Birth: May 23, 1945

## 2017-11-26 ENCOUNTER — Ambulatory Visit: Payer: Medicare Other | Admitting: Occupational Therapy

## 2017-11-26 ENCOUNTER — Encounter: Payer: Self-pay | Admitting: Occupational Therapy

## 2017-11-26 DIAGNOSIS — M6281 Muscle weakness (generalized): Secondary | ICD-10-CM

## 2017-11-26 DIAGNOSIS — R278 Other lack of coordination: Secondary | ICD-10-CM | POA: Diagnosis not present

## 2017-11-26 NOTE — Therapy (Signed)
[place] [facility] MAIN [facility] SERVICES [address] [place], [place], [address] Phone: [phone]   Fax:  [phone]  Occupational Therapy Evaluation  Patient Details  Name: Jason Nolan MRN: [id number] Date of Birth: July 03, 1945 Referring Provider: [name]   Encounter Date: 11/26/2017  OT End of Session - 11/26/17 1419    Visit Number  57    Number of Visits  72    Date for OT Re-Evaluation  12/15/17    Authorization Type  Visit 6 of 10 for progress report period starting 08/25/2017    OT Start Time  1345    OT Stop Time  1430    OT Time Calculation (min)  45 min    Activity Tolerance  Patient tolerated treatment well    Behavior During Therapy  [facility] for tasks assessed/performed       Past Medical History:  Diagnosis Date  . Glaucoma   . Hepatitis C   . Stroke [facility])     Past Surgical History:  Procedure Laterality Date  . IR ANGIO INTRA EXTRACRAN SEL COM CAROTID INNOMINATE UNI L MOD SED  11/25/2016  . IR ANGIO VERTEBRAL SEL SUBCLAVIAN INNOMINATE UNI R MOD SED  11/25/2016  . IR ANGIO VERTEBRAL SEL VERTEBRAL UNI L MOD SED  11/25/2016  . IR INTRAVSC STENT CERV CAROTID W/O EMB-PROT MOD SED INC ANGIO  11/25/2016  . IR PERCUTANEOUS ART THROMBECTOMY/INFUSION INTRACRANIAL INC DIAG ANGIO  11/25/2016  . IR RADIOLOGIST EVAL & MGMT  02/13/2017  . RADIOLOGY WITH ANESTHESIA N/A 11/25/2016   Procedure: RADIOLOGY WITH ANESTHESIA;  Surgeon: Radiologist, Medication, MD;  Location: [facility] OR;  Service: Radiology;  Laterality: N/A;    There were no vitals filed for this visit.  Subjective Assessment - 11/26/17 1406    Subjective   Patient reports he needs to leave early today, has another appt to go to today.  Reports he still has difficulty with engaging and using the ulnar side of the hand.     Patient is accompained by:  Family member    Pertinent History  Pt. is a 72 y.o. 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 Pt. was transferred to  [facility]. He underwent CTA brain showed short segment near occlusion of proximal R-ICA with associated intraluminal thrombus --likely due to dissection and emergent large vessel occlusion with right M2 occlusion and evolving right frontal lobe infarct.  He underwent cerebral angio with  complete revascularization of occluded superior division of R-MCA and near complete revascularization of proximal R-ICA with stent assisted angioplasty.  Stroke felt to be embolic in setting of ruptured plaque R-ICA and he was placed on ASA and brillinta due to stent. Pt. was transferred to inpatient rehab for several weeks, had home health therapy upon discharge, and now is ready for outpatient therapy services.    Limitations  Dominant LUE functioning, left hand edema, distal ROM, coordination, and overal strength.    Patient Stated Goals  To regain the use of his LUE.    Currently in Pain?  No/denies       OT TREATMENT    Neuro muscular re-education:  Pt. worked on using his left hand for grasping, flipping and 2" large pegs on the [employer] board placed at a tabletop surface. Pt. worked on grasping 1" resistive cubes alternating thumb opposition to the tip of the 2nd through 5th digits while the board is placed at a vertical angle. Pt. worked on pressing the cubes back into  place  using isolated 5th digit extension.  Therapeutic Exercise:  Pt. Worked on the [employer] for 8 min. With constant monitoring of the BUEs. Pt. Worked on changing, and alternating forward reverse position every 2 min. Rest breaks were required. Pt. Worked on level 5.5                    OT Education - 11/26/17 1411    Education provided  Yes    Education Details  home exercises with focus on ulnar side of the hand, coordination of the hand and oppositional movements    Person(s) Educated  Patient    Methods  Explanation;Demonstration    Comprehension  Verbalized understanding;Returned demonstration          OT  Long Term Goals - 11/03/17 1429      OT LONG TERM GOAL #3   Title  Pt. left hand grip with  increase by 5# in preparation for opening a jar.    Baseline  11/03/2017: improving    Time  12    Period  Weeks    Status  On-going    Target Date  12/15/17      OT LONG TERM GOAL #6   Title  Pt. will improve left hand coordination skills to be able to button clothing.    Baseline  11/03/2017: Pt. has improved with left hand coordination skills, continues to have difficulty with buttoning.    Time  2    Period  Weeks    Status  On-going    Target Date  12/15/17      OT LONG TERM GOAL #8   Title  Pt. will increase Left pinch strength to be able to grasp and hold items during ADL tasks.    Baseline  11/03/2017: Pt. continues to progress however pinch strength, and ability to grasp, and hold objects.    Time  12    Period  Weeks    Status  On-going    Target Date  12/15/17      OT LONG TERM GOAL  #9   Baseline  Pt. will write one sentence with 75% legibility and efficiently while maintaing grasp on pen 100% of the time.    Time  12    Period  Weeks    Status  On-going    Target Date  12/15/17      OT LONG TERM GOAL  #10   TITLE  Pt. will independently use his left hand to grasp and efficiently accomodate to the varying weights of ADL objects during self care tasks.     Baseline  11/03/2017: Pt. conitnues to have difficulty grasping and adjusting hand to the varying weights of objects.    Time  12    Period  Weeks    Status  On-going    Target Date  12/15/17            Plan - 11/26/17 1421    Clinical Impression Statement  Pt. reports he went to the driving range this morning before he came to therapy, and plans to go to the gym afterwards. Pt. continues to work on improving translatory movements of the left hand. Pt. continues to present with stffness, and limited thumb movements.    Occupational performance deficits (Please refer to evaluation for details):  ADL's;IADL's    Rehab  Potential  Excellent    OT Frequency  2x / week    OT Duration  12 weeks  OT Treatment/Interventions  Self-care/ADL training;Therapeutic exercise;Neuromuscular education;Patient/family education;Energy conservation;Therapeutic activities;DME and/or AE instruction    Clinical Decision Making  Several treatment options, min-mod task modification necessary    Consulted and Agree with Plan of Care  Patient       Patient will benefit from skilled therapeutic intervention in order to improve the following deficits and impairments:  Abnormal gait, Pain, Decreased strength, Decreased range of motion, Impaired UE functional use, Decreased coordination, Decreased knowledge of use of DME, Decreased activity tolerance, Difficulty walking, Decreased balance, Other (comment)  Visit Diagnosis: Muscle weakness (generalized)  Other lack of coordination    Problem List Patient Active Problem List   Diagnosis Date Noted  . 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 Sore throat 09/19/2017  . Low vitamin B12 level 06/27/2017  . 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 Pure hypercholesterolemia 05/28/2017  . Erectile dysfunction 05/28/2017  . Left hemiparesis (HCC)   . Dysphagia 11/29/2016  . Essential hypertension 11/29/2016  . 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 Leukopenia 03/31/2016  . Hepatitis C 07/22/2013    Olegario Messier, MS,OTR/L 11/26/2017, 2:31 PM  [place] [facility] MAIN [facility] SERVICES [address] [place], [place], [address] Phone: [phone]   Fax:  [phone]  Name: Kellin Bartling MRN: [id number] Date of Birth: Jun 07, 1945

## 2017-12-02 ENCOUNTER — Ambulatory Visit (HOSPITAL_COMMUNITY)
Admission: RE | Admit: 2017-12-02 | Discharge: 2017-12-02 | Disposition: A | Discharge status: Home / Self Care | Payer: Medicare Other | Source: Ambulatory Visit | Attending: Interventional Radiology | Admitting: Interventional Radiology

## 2017-12-02 ENCOUNTER — Ambulatory Visit: Payer: Medicare Other | Admitting: Occupational Therapy

## 2017-12-02 ENCOUNTER — Encounter: Payer: Self-pay | Admitting: Occupational Therapy

## 2017-12-02 DIAGNOSIS — R278 Other lack of coordination: Secondary | ICD-10-CM

## 2017-12-02 DIAGNOSIS — I771 Stricture of artery: Secondary | ICD-10-CM | POA: Diagnosis not present

## 2017-12-02 DIAGNOSIS — M6281 Muscle weakness (generalized): Secondary | ICD-10-CM

## 2017-12-02 NOTE — Progress Notes (Signed)
Preliminary notes--Bilateral carotid duplex exam completed.  Right ICA stent appears patent.  No significant hemodynamic stenosis according to the flow velocity.  Left ICA 1-39% stenosis.  Bilateral vertebral arteries patent with antegrade flow.  Jason Nolan (RDMS RVT) 12/02/17 2:54 PM

## 2017-12-02 NOTE — Therapy (Signed)
[facility] MAIN [facility] SERVICES [address] [place], [place], [address] Phone: [phone]   Fax:  [phone]  Occupational Therapy Treatment  Patient Details  Name: Jason Nolan MRN: [id number] Date of Birth: [birth year] Referring Provider: [name]   Encounter Date: 12/02/2017  OT End of Session - 12/02/17 0949    Visit Number  58    Number of Visits  72    Date for OT Re-Evaluation  12/15/17    Authorization Type  Visit 7 of 10 for progress report period starting 08/25/2017    OT Start Time  0930    OT Stop Time  1015    OT Time Calculation (min)  45 min    Activity Tolerance  Patient tolerated treatment well    Behavior During Therapy  [facility] for tasks assessed/performed       Past Medical History:  Diagnosis Date  . Glaucoma   . Hepatitis C   . Stroke [facility])     Past Surgical History:  Procedure Laterality Date  . IR ANGIO INTRA EXTRACRAN SEL COM CAROTID INNOMINATE UNI L MOD SED  11/25/2016  . IR ANGIO VERTEBRAL SEL SUBCLAVIAN INNOMINATE UNI R MOD SED  11/25/2016  . IR ANGIO VERTEBRAL SEL VERTEBRAL UNI L MOD SED  11/25/2016  . IR INTRAVSC STENT CERV CAROTID W/O EMB-PROT MOD SED INC ANGIO  11/25/2016  . IR PERCUTANEOUS ART THROMBECTOMY/INFUSION INTRACRANIAL INC DIAG ANGIO  11/25/2016  . IR RADIOLOGIST EVAL & MGMT  02/13/2017  . RADIOLOGY WITH ANESTHESIA N/A 11/25/2016   Procedure: RADIOLOGY WITH ANESTHESIA;  Surgeon: Radiologist, Medication, MD;  Location: [facility] OR;  Service: Radiology;  Laterality: N/A;    There were no vitals filed for this visit.  Subjective Assessment - 12/02/17 0938    Subjective   Pt. reports he didn't do well playing golf this past weekend.    Patient is accompained by:  Family member    Pertinent History  Pt. is a 72 y.o. 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 Pt. was transferred to [facility]. He underwent CTA brain showed short segment near occlusion of proximal R-ICA with associated  intraluminal thrombus --likely due to dissection and emergent large vessel occlusion with right M2 occlusion and evolving right frontal lobe infarct.  He underwent cerebral angio with  complete revascularization of occluded superior division of R-MCA and near complete revascularization of proximal R-ICA with stent assisted angioplasty.  Stroke felt to be embolic in setting of ruptured plaque R-ICA and he was placed on ASA and brillinta due to stent. Pt. was transferred to inpatient rehab for several weeks, had home health therapy upon discharge, and now is ready for outpatient therapy services.    Limitations  Dominant LUE functioning, left hand edema, distal ROM, coordination, and overal strength.    Patient Stated Goals  To regain the use of his LUE.    Currently in Pain?  No/denies      OT TREATMENT    Neuro muscular re-education:  Pt. worked on translatory movements of the left hand moving 1/2" flat marbles through his hand. Pt. has improved with the thumb movements needed to move the objects through his hand towards the tip of his 2nd digit, and thumb. Pt. progressed to 1/4" objects, and had more difficulty. 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 Pt. With improved hand grasp on the pen, and improved legibility for printing the days of the week.  OT Education - 12/02/17 0948    Education provided  Yes    Education Details  [facility] skills, translatory movements.    Person(s) Educated  Patient    Methods  Explanation;Demonstration    Comprehension  Verbalized understanding;Returned demonstration          OT Long Term Goals - 11/03/17 1429      OT LONG TERM GOAL #3   Title  Pt. left hand grip with  increase by 5# in preparation for opening a jar.    Baseline  11/03/2017: improving    Time  12    Period  Weeks    Status  On-going    Target Date  12/15/17      OT LONG TERM GOAL #6   Title  Pt. will improve left hand coordination skills to be able to  button clothing.    Baseline  11/03/2017: Pt. has improved with left hand coordination skills, continues to have difficulty with buttoning.    Time  2    Period  Weeks    Status  On-going    Target Date  12/15/17      OT LONG TERM GOAL #8   Title  Pt. will increase Left pinch strength to be able to grasp and hold items during ADL tasks.    Baseline  11/03/2017: Pt. continues to progress however pinch strength, and ability to grasp, and hold objects.    Time  12    Period  Weeks    Status  On-going    Target Date  12/15/17      OT LONG TERM GOAL  #9   Baseline  Pt. will write one sentence with 75% legibility and efficiently while maintaing grasp on pen 100% of the time.    Time  12    Period  Weeks    Status  On-going    Target Date  12/15/17      OT LONG TERM GOAL  #10   TITLE  Pt. will independently use his left hand to grasp and efficiently accomodate to the varying weights of ADL objects during self care tasks.     Baseline  11/03/2017: Pt. conitnues to have difficulty grasping and adjusting hand to the varying weights of objects.    Time  12    Period  Weeks    Status  On-going    Target Date  12/15/17            Plan - 12/02/17 0953    Clinical Impression Statement  Pt. reports that he has been out to the golf course several times this past week. Pt. reports that he did not have a very good golf game this weekend. Pt. is making progress with translatory movements of the left hand, however continues to work on improving his ability to move objects through his hand.     Occupational performance deficits (Please refer to evaluation for details):  ADL's;IADL's    Rehab Potential  Excellent    OT Frequency  2x / week    OT Duration  12 weeks    OT Treatment/Interventions  Self-care/ADL training;Therapeutic exercise;Neuromuscular education;Patient/family education;Energy conservation;Therapeutic activities;DME and/or AE instruction    Clinical Decision Making  Several  treatment options, min-mod task modification necessary    Consulted and Agree with Plan of Care  Patient       Patient will benefit from skilled therapeutic intervention in order to improve the following deficits and impairments:  Abnormal gait, Pain, Decreased strength,  Decreased range of motion, Impaired UE functional use, Decreased coordination, Decreased knowledge of use of DME, Decreased activity tolerance, Difficulty walking, Decreased balance, Other (comment)  Visit Diagnosis: Muscle weakness (generalized)  Other lack of coordination    Problem List Patient Active Problem List   Diagnosis Date Noted  . 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 Sore throat 09/19/2017  . Low vitamin B12 level 06/27/2017  . 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 Pure hypercholesterolemia 05/28/2017  . Erectile dysfunction 05/28/2017  . Left hemiparesis (HCC)   . Dysphagia 11/29/2016  . Essential hypertension 11/29/2016  . 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 Leukopenia 03/31/2016  . Hepatitis C 07/22/2013    Olegario MessierElaine Tasman Zapata, MS, OTR/L 12/02/2017, 10:06 AM  [place] [facility] [address] [place], [place], [address] Phone: [phone]   Fax:  [phone]  Name: Jason Nolan MRN: [id number] Date of Birth: 06/05/1945

## 2017-12-04 ENCOUNTER — Encounter: Payer: Self-pay | Admitting: Occupational Therapy

## 2017-12-04 ENCOUNTER — Ambulatory Visit: Payer: Medicare Other | Attending: Internal Medicine | Admitting: Occupational Therapy

## 2017-12-04 DIAGNOSIS — M6281 Muscle weakness (generalized): Secondary | ICD-10-CM

## 2017-12-04 DIAGNOSIS — R278 Other lack of coordination: Secondary | ICD-10-CM

## 2017-12-04 NOTE — Therapy (Signed)
[place] [facility] MAIN [facility] SERVICES [address] [place], [place], [address] Phone: [phone]   Fax:  [phone]  Occupational Therapy Treatment/Recertification/Occupational Therapy Progress Note  Dates of reporting period  08/25/2017   to   12/04/2017  Patient Details  Name: Jason Nolan MRN: [id number] Date of Birth: [birth year] Referring Provider: [name]   Encounter Date: 12/04/2017  OT End of Session - 12/04/17 1156    Visit Number  59    Number of Visits  72    Authorization Type  *(progress report, and recert completed last session. Next visit starts visit 1/10 for new progress reporting period ) Visit 8 of 10 for progress report period starting 08/25/2017    OT Start Time  1145    OT Stop Time  1230    OT Time Calculation (min)  45 min    Activity Tolerance  Patient tolerated treatment well    Behavior During Therapy  [facility] for tasks assessed/performed       Past Medical History:  Diagnosis Date  . Glaucoma   . Hepatitis C   . Stroke [facility])     Past Surgical History:  Procedure Laterality Date  . IR ANGIO INTRA EXTRACRAN SEL COM CAROTID INNOMINATE UNI L MOD SED  11/25/2016  . IR ANGIO VERTEBRAL SEL SUBCLAVIAN INNOMINATE UNI R MOD SED  11/25/2016  . IR ANGIO VERTEBRAL SEL VERTEBRAL UNI L MOD SED  11/25/2016  . IR INTRAVSC STENT CERV CAROTID W/O EMB-PROT MOD SED INC ANGIO  11/25/2016  . IR PERCUTANEOUS ART THROMBECTOMY/INFUSION INTRACRANIAL INC DIAG ANGIO  11/25/2016  . IR RADIOLOGIST EVAL & MGMT  02/13/2017  . RADIOLOGY WITH ANESTHESIA N/A 11/25/2016   Procedure: RADIOLOGY WITH ANESTHESIA;  Surgeon: Radiologist, Medication, MD;  Location: [facility] OR;  Service: Radiology;  Laterality: N/A;    There were no vitals filed for this visit.  Subjective Assessment - 12/04/17 1155    Subjective   Pt    Patient is accompained by:  Family member    Pertinent History  Pt. is a 72 y.o. 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 Pt.  was transferred to [facility]. He underwent CTA brain showed short segment near occlusion of proximal R-ICA with associated intraluminal thrombus --likely due to dissection and emergent large vessel occlusion with right M2 occlusion and evolving right frontal lobe infarct.  He underwent cerebral angio with  complete revascularization of occluded superior division of R-MCA and near complete revascularization of proximal R-ICA with stent assisted angioplasty.  Stroke felt to be embolic in setting of ruptured plaque R-ICA and he was placed on ASA and brillinta due to stent. Pt. was transferred to inpatient rehab for several weeks, had home health therapy upon discharge, and now is ready for outpatient therapy services.    Limitations  Dominant LUE functioning, left hand edema, distal ROM, coordination, and overal strength.    Currently in Pain?  No/denies         [facility] OT Assessment - 12/04/17 1205      Coordination   Left 9 Hole Peg Test  1 min. & 7 sec. Pt. dropped 2 pegs.      Hand Function   Left Hand Grip (lbs)  25#    Left Hand Lateral Pinch  13 lbs    Left 3 point pinch  9 lbs      OT TREATMENT    Measurements were obtained, and goals were reviewed with the pt.  Therapeutic  Exercise:  Pt. worked on pinch strengthening in the left hand for lateral, and 3pt. pinch using red, green, and blue resistive clips. Pt. worked on placing the clips at various vertical and horizontal angles. Tactile and verbal cues were required for eliciting the desired movement. Pt. required verbal cues, and visual demonstration for hand movement patterns.                         OT Long Term Goals - 12/04/17 1213      OT LONG TERM GOAL #3   Title  Pt. left hand grip with  increase by 5# in preparation for opening a jar.    Baseline  12/04/2017: improving    Time  12    Period  Weeks    Status  On-going    Target Date  02/26/18      Long Term Additional Goals   Additional Long Term  Goals  Yes      OT LONG TERM GOAL #6   Title  Pt. will improve left hand coordination skills to be able to button clothing.    Baseline  12/04/2017: Pt. has improved with left hand coordination skills, continues to have difficulty with buttoning.    Time  12    Period  Weeks    Status  On-going    Target Date  02/26/18      OT LONG TERM GOAL #8   Title  Pt. will increase Left pinch strength to be able to grasp and hold items during ADL tasks.    Baseline  12/04/2017: Pt. continues to progress with  pinch strength, and ability to grasp, however has difficulty holding objects.    Time  12    Period  Weeks    Status  On-going    Target Date  02/26/18      OT LONG TERM GOAL  #9   Baseline  Pt. will write one sentence with 75% legibility and efficiently while maintaing grasp on pen 100% of the time. . to write 7 words with a small pen grip, Pt. had to shift the pen in his fingers multiple times.    Time  12    Period  Weeks    Status  On-going    Target Date  02/26/18      OT LONG TERM GOAL  #10   TITLE  Pt. will independently use his left hand to grasp and efficiently accomodate to the varying weights of ADL objects during self care tasks.     Baseline  12/04/2017: Pt. continues to work on grasping and adjusting hand to the varying weights of objects.    Time  12    Period  Weeks    Status  On-going    Target Date  02/26/18      OT LONG TERM GOAL  #11   TITLE  Pt. will improve [facility] skills to be able to independently manipulate items for ADLs/IADLs when UEs are elevated when reaching into the closet, or microwave.    Baseline  12/04/2017: pt. has difficulty grasping, and manipulating objects when UEs elevated     Time  12    Period  Weeks    Status  New    Target Date  02/26/18            Plan - 12/04/17 1200    Clinical Impression Statement Pt. is making steady progress, and is now using his left hand to clip  his fingernails on the right hand once the position of the  clippers are set in his hand. Pt. continues to work on improving hand function skills being able to accomodate to the various weights, and textures of objects to avoid overgrippiing light objects for example when being handed a receipt. Pt. continues to worked on improving [facility] skills while being challenged in various contexts, and in standing and when UEs are elevated reaching to handle objects in  the microwave or closets. Pt. continues to work on improving writing legibiliity, and speed while maintaining grasp on the pen. Goals were reviewed with the patient.   Occupational performance deficits (Please refer to evaluation for details):  ADL's;IADL's    Rehab Potential  Excellent    OT Frequency  2x / week    OT Duration  12 weeks    OT Treatment/Interventions  Self-care/ADL training;Therapeutic exercise;Neuromuscular education;Patient/family education;Energy conservation;Therapeutic activities;DME and/or AE instruction    Clinical Decision Making  Several treatment options, min-mod task modification necessary    Consulted and Agree with Plan of Care  Patient       Patient will benefit from skilled therapeutic intervention in order to improve the following deficits and impairments:  Abnormal gait, Pain, Decreased strength, Decreased range of motion, Impaired UE functional use, Decreased coordination, Decreased knowledge of use of DME, Decreased activity tolerance, Difficulty walking, Decreased balance, Other (comment)  Visit Diagnosis: Muscle weakness (generalized)  Other lack of coordination    Problem List Patient Active Problem List   Diagnosis Date Noted  . 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 Sore throat 09/19/2017  . Low vitamin B12 level 06/27/2017  . 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 Pure hypercholesterolemia 05/28/2017  . Erectile dysfunction 05/28/2017  . Left hemiparesis (HCC)   . Dysphagia 11/29/2016  . Essential hypertension 11/29/2016  .  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 Leukopenia 03/31/2016  . Hepatitis C 07/22/2013    Olegario MessierElaine Nicklous Aburto, MS, OTR/L 12/04/2017, 12:54 PM  [facility] [address] [place], [place], [address] Phone: [phone]   Fax:  [phone]  Name: Jason CroakJames Nolan MRN: [id number] Date of Birth: 1945/12/02

## 2017-12-04 NOTE — Addendum Note (Signed)
Addended by: Avon GullyJAGENTENFL, Hawke Villalpando M on: 12/04/2017 05:19 PM   Modules accepted: Orders

## 2017-12-08 ENCOUNTER — Ambulatory Visit: Payer: Medicare Other | Admitting: Occupational Therapy

## 2017-12-08 ENCOUNTER — Telehealth (HOSPITAL_COMMUNITY): Payer: Self-pay

## 2017-12-08 DIAGNOSIS — M6281 Muscle weakness (generalized): Secondary | ICD-10-CM | POA: Diagnosis not present

## 2017-12-08 DIAGNOSIS — R278 Other lack of coordination: Secondary | ICD-10-CM | POA: Diagnosis not present

## 2017-12-08 NOTE — Telephone Encounter (Signed)
Pt's wife agreed to f/u in 6 months with a us carotid. AW

## 2017-12-11 ENCOUNTER — Ambulatory Visit: Payer: Medicare Other | Admitting: Occupational Therapy

## 2017-12-11 ENCOUNTER — Other Ambulatory Visit: Payer: Self-pay

## 2017-12-11 ENCOUNTER — Encounter: Payer: Medicare Other | Attending: Physical Medicine & Rehabilitation

## 2017-12-11 ENCOUNTER — Encounter: Payer: Self-pay | Admitting: Occupational Therapy

## 2017-12-11 ENCOUNTER — Ambulatory Visit (HOSPITAL_BASED_OUTPATIENT_CLINIC_OR_DEPARTMENT_OTHER): Payer: Medicare Other | Admitting: Physical Medicine & Rehabilitation

## 2017-12-11 ENCOUNTER — Encounter: Payer: Self-pay | Admitting: Physical Medicine & Rehabilitation

## 2017-12-11 VITALS — BP 120/63 | HR 50 | Ht 70.0 in | Wt 195.4 lb

## 2017-12-11 DIAGNOSIS — G8194 Hemiplegia, unspecified affecting left nondominant side: Secondary | ICD-10-CM

## 2017-12-11 DIAGNOSIS — Z87891 Personal history of nicotine dependence: Secondary | ICD-10-CM | POA: Insufficient documentation

## 2017-12-11 DIAGNOSIS — I639 Cerebral infarction, unspecified: Secondary | ICD-10-CM

## 2017-12-11 DIAGNOSIS — M6281 Muscle weakness (generalized): Secondary | ICD-10-CM

## 2017-12-11 DIAGNOSIS — R278 Other lack of coordination: Secondary | ICD-10-CM | POA: Diagnosis not present

## 2017-12-11 DIAGNOSIS — R600 Localized edema: Secondary | ICD-10-CM | POA: Insufficient documentation

## 2017-12-11 DIAGNOSIS — I69322 Dysarthria following cerebral infarction: Secondary | ICD-10-CM

## 2017-12-11 DIAGNOSIS — I69352 Hemiplegia and hemiparesis following cerebral infarction affecting left dominant side: Secondary | ICD-10-CM | POA: Insufficient documentation

## 2017-12-11 DIAGNOSIS — H409 Unspecified glaucoma: Secondary | ICD-10-CM | POA: Insufficient documentation

## 2017-12-11 NOTE — Therapy (Signed)
[facility] MAIN [facility] SERVICES [address] [place], [place], [address] Phone: [phone]   Fax:  [phone]  Occupational Therapy Treatment  Patient Details  Name: Jason Nolan MRN: [id number] Date of Birth: 07-24-1945 Referring Provider: [name]   Encounter Date: 12/08/2017  OT End of Session - 12/11/17 1012    Visit Number  60    Number of Visits  72    Date for OT Re-Evaluation  12/15/17    Authorization Type  Medicare 1 of 10, reporting period starting 12/08/2017    OT Start Time  1115    OT Stop Time  1200    OT Time Calculation (min)  45 min    Activity Tolerance  Patient tolerated treatment well    Behavior During Therapy  [facility] for tasks assessed/performed       Past Medical History:  Diagnosis Date  . Glaucoma   . Hepatitis C   . Stroke [facility])     Past Surgical History:  Procedure Laterality Date  . IR ANGIO INTRA EXTRACRAN SEL COM CAROTID INNOMINATE UNI L MOD SED  11/25/2016  . IR ANGIO VERTEBRAL SEL SUBCLAVIAN INNOMINATE UNI R MOD SED  11/25/2016  . IR ANGIO VERTEBRAL SEL VERTEBRAL UNI L MOD SED  11/25/2016  . IR INTRAVSC STENT CERV CAROTID W/O EMB-PROT MOD SED INC ANGIO  11/25/2016  . IR PERCUTANEOUS ART THROMBECTOMY/INFUSION INTRACRANIAL INC DIAG ANGIO  11/25/2016  . IR RADIOLOGIST EVAL & MGMT  02/13/2017  . RADIOLOGY WITH ANESTHESIA N/A 11/25/2016   Procedure: RADIOLOGY WITH ANESTHESIA;  Surgeon: Radiologist, Medication, MD;  Location: [facility] OR;  Service: Radiology;  Laterality: N/A;    There were no vitals filed for this visit.  Subjective Assessment - 12/11/17 1011    Subjective   Patient reports since doing the exercises this therapist gave the last time we work together, the fingers are doing better and it has helped with making a full fist now.     Pertinent History  Pt. is a 72 y.o. 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 Pt. was transferred to [facility]. He underwent CTA brain showed short  segment near occlusion of proximal R-ICA with associated intraluminal thrombus --likely due to dissection and emergent large vessel occlusion with right M2 occlusion and evolving right frontal lobe infarct.  He underwent cerebral angio with  complete revascularization of occluded superior division of R-MCA and near complete revascularization of proximal R-ICA with stent assisted angioplasty.  Stroke felt to be embolic in setting of ruptured plaque R-ICA and he was placed on ASA and brillinta due to stent. Pt. was transferred to inpatient rehab for several weeks, had home health therapy upon discharge, and now is ready for outpatient therapy services.    Patient Stated Goals  To regain the use of his LUE.    Currently in Pain?  No/denies    Pain Score  0-No pain         Patient seen for focus on index finger extension with use of yellow putty with cues for form.  Oppositional movements of thumb to digits with use  Of yellow putty for strengthening.  Coordination skills on left for manipulation of objects 1/2 inch in size with cues for prehension patterns of index, middle finger and thumb  Handwriting:  Issued adapted pen to reinforce tripod grasp, patient to use at home for homework as well.  Patient performing name and demonstrates severe micrographia.   Instructed on focused  stroke formation of circles, diagonals and cross.                    OT Education - 12/11/17 1011    Education provided  Yes    Education Details  [facility] skills, translatory movements.    Person(s) Educated  Patient    Methods  Explanation;Demonstration    Comprehension  Verbalized understanding;Returned demonstration                 Plan - 12/11/17 1012    Clinical Impression Statement  Patient has continued to make progress with left dominant hand, strength and motion improved in ulnar side of the hand and can now make a full fist.  Patient lacking full extension and added yellow putty  exercises for finger extension and patient to work on at home.  With handwriting, his pen grip is not secure and lacks isolated finger coordinated movements to produce more legible handwriting.  When writing his name, he demonstrates micrographia.  Issued handwriting homework for focus on stroke formation with circles, loops, diagonals. Continue to work towards goals.     Occupational performance deficits (Please refer to evaluation for details):  ADL's;IADL's    Rehab Potential  Excellent    OT Frequency  2x / week    OT Duration  12 weeks    OT Treatment/Interventions  Self-care/ADL training;Therapeutic exercise;Neuromuscular education;Patient/family education;Energy conservation;Therapeutic activities;DME and/or AE instruction    Consulted and Agree with Plan of Care  Patient       Patient will benefit from skilled therapeutic intervention in order to improve the following deficits and impairments:  Abnormal gait, Pain, Decreased strength, Decreased range of motion, Impaired UE functional use, Decreased coordination, Decreased knowledge of use of DME, Decreased activity tolerance, Difficulty walking, Decreased balance, Other (comment)  Visit Diagnosis: Muscle weakness (generalized)  Other lack of coordination    Problem List Patient Active Problem List   Diagnosis Date Noted  . 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 Sore throat 09/19/2017  . Low vitamin B12 level 06/27/2017  . 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 Pure hypercholesterolemia 05/28/2017  . Erectile dysfunction 05/28/2017  . Left hemiparesis (HCC)   . Dysphagia 11/29/2016  . Essential hypertension 11/29/2016  . 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 Leukopenia 03/31/2016  . Hepatitis C 07/22/2013   Arizbeth Cawthorn T Arne ClevelandLovett, OTR/L, CLT  Philo Kurtz 12/11/2017, 10:17 AM  [place] [facility] [address] , [place], [address] Phone: [phone]   Fax:  [phone]  Name: Terrilee CroakJames Wickey MRN: [id number] Date of Birth: 1945-12-12

## 2017-12-11 NOTE — Therapy (Signed)
[facility]IN [facility] [address] [place], [place], [address] Phone: [phone]   Fax:  [phone]  Occupational Therapy Treatment  Patient Details  Name: Jason CroakJames Nolan MRN: [id number] Date of Birth: 06/25/1945 Referring Provider: Lorre MunroeBAITY, REGINA W   Encounter Date: 12/11/2017  OT End of Session - 12/11/17 1049    Visit Number  61    Number of Visits  72    Date for OT Re-Evaluation  12/15/17    Authorization Type  Medicare 2 of 10, reporting period starting 12/08/2017    OT Start Time  0845    OT Stop Time  0939    OT Time Calculation (min)  54 min    Activity Tolerance  Patient tolerated treatment well    Behavior During Therapy  [facility] for tasks assessed/performed       Past Medical History:  Diagnosis Date  . Glaucoma   . Hepatitis C   . Stroke [facility](HCC)     Past Surgical History:  Procedure Laterality Date  . IR ANGIO INTRA EXTRACRAN SEL COM CAROTID INNOMINATE UNI L MOD SED  11/25/2016  . IR ANGIO VERTEBRAL SEL SUBCLAVIAN INNOMINATE UNI R MOD SED  11/25/2016  . IR ANGIO VERTEBRAL SEL VERTEBRAL UNI L MOD SED  11/25/2016  . IR INTRAVSC STENT CERV CAROTID W/O EMB-PROT MOD SED INC ANGIO  11/25/2016  . IR PERCUTANEOUS ART THROMBECTOMY/INFUSION INTRACRANIAL INC DIAG ANGIO  11/25/2016  . IR RADIOLOGIST EVAL & MGMT  02/13/2017  . RADIOLOGY WITH ANESTHESIA N/A 11/25/2016   Procedure: RADIOLOGY WITH ANESTHESIA;  Surgeon: Radiologist, Medication, MD;  Location: [facility] OR;  Service: Radiology;  Laterality: N/A;    There were no vitals filed for this visit.  Subjective Assessment - 12/11/17 1048    Subjective   Handwriting-feels he cannot get the isolated movements of the fingers to control the pen.  Still writing small    Patient is accompained by:  Family member    Pertinent History  Pt. is a 72 y.o. 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 [facility] He underwent CTA brain showed short segment near  occlusion of proximal R-ICA with associated intraluminal thrombus --likely due to dissection and emergent large vessel occlusion with right M2 occlusion and evolving right frontal lobe infarct.  He underwent cerebral angio with  complete revascularization of occluded superior division of R-MCA and near complete revascularization of proximal R-ICA with stent assisted angioplasty.  Stroke felt to be embolic in setting of ruptured plaque R-ICA and he was placed on ASA and brillinta due to stent. Pt. was transferred to inpatient rehab for several weeks, had home health therapy upon discharge, and now is ready for outpatient therapy services.    Limitations  Dominant LUE functioning, left hand edema, distal ROM, coordination, and overal strength.    Patient Stated Goals  To regain the use of his LUE.    Currently in Pain?  No/denies         Long dowel, attempting to walk fingers up and down, unable.  Modified to put coban around for increase texture of area and  then targeting moving index and thumb individually.  Full fist and full opposition to small finger today.  Finger extension, ABD, ADD and tapping of fingers, some with blocking technique.  Ball pegs with placing using tripod prehension pattern, removing with oppositional patterns to each digit including small finger.  Performed handwriting with adapted pen versus regular pen:  Adapted pen helps to position fingers into a secure tripod grasp.   When using regular pen, patient demonstrates a thumb wrapped grasp pattern and has decreased stability of the pen.  Decreased  isolated movements of the thumb and index for the formation of strokes.                    OT Education - 12/11/17 1049    Education provided  Yes    Education Details  handwriting, oppositional movements, isolated finger movements    Person(s) Educated  Patient    Methods  Explanation;Demonstration    Comprehension  Verbalized understanding;Returned  demonstration                 Plan - 12/11/17 1050    Clinical Impression Statement  Patient able to demonstrate full fisting today along with full opposition from thumb to each digit.  Patient has continued to work at home on handwriting but feels it is difficult due to decreased isolated figner movements.  Exercises this date to target isolated finger movements and to add to home program.  Patient able to demonstrate understanding of exercises and is working consistently at home.  He still lacks isolated finger movements and coordination of movements to produce more legible handwriting.  Continue to work on these areas to improve independence in daily tasks.     Occupational performance deficits (Please refer to evaluation for details):  ADL's;IADL's    Rehab Potential  Excellent    OT Frequency  2x / week    OT Duration  12 weeks    OT Treatment/Interventions  Self-care/ADL training;Therapeutic exercise;Neuromuscular education;Patient/family education;Energy conservation;Therapeutic activities;DME and/or AE instruction    Consulted and Agree with Plan of Care  Patient       Patient will benefit from skilled therapeutic intervention in order to improve the following deficits and impairments:  Abnormal gait, Pain, Decreased strength, Decreased range of motion, Impaired UE functional use, Decreased coordination, Decreased knowledge of use of DME, Decreased activity tolerance, Difficulty walking, Decreased balance, Other (comment)  Visit Diagnosis: Muscle weakness (generalized)  Other lack of coordination    Problem List Patient Active Problem List   Diagnosis Date Noted  . 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 Sore throat 09/19/2017  . Low vitamin B12 level 06/27/2017  . 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 Pure hypercholesterolemia 05/28/2017  . Erectile dysfunction 05/28/2017  . Left hemiparesis (HCC)   . Dysphagia 11/29/2016  . Essential  hypertension 11/29/2016  . 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 Leukopenia 03/31/2016  . Hepatitis C 07/22/2013   Tiffony Kite T Arne Cleveland, OTR/L, CLT  Shantil Vallejo 12/11/2017, 10:55 AM  [facility] MAIN [facility] SERVICES [address] [place], [place], [address] Phone: [phone]   Fax:  [phone]  Name: Jason Nolan MRN: [id number] Date of Birth: 1945/09/10

## 2017-12-11 NOTE — Progress Notes (Signed)
Subjective:    Patient ID: Terrilee CroakJames Funk, male    DOB: [birth year], 72 y.o.   MRN: [id number] 72 y.o. left handed male with history of Hep C-treated with Harvoni, glaucoma who was [facility] with left sided weakness, facial droop and slurred speech due to early changes of R-MCA territory infarct.  He underwent CTA brain showed short segment near occlusion of proximal R-ICA with associated intraluminal thrombus --likely due to dissection and emergent large vessel occlusion with right M2 occlusion and evolving right frontal lobe infarct.  He underwent cerebral angio with  complete revascularization of occluded superior division of R-MCA and near complete revascularization of proximal R-ICA with stent assisted angioplasty.  Stroke felt to be embolic in setting of ruptured plaque R-ICA and he was placed on ASA and brillinta due to stent.  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 General activity 0 Relation with others 0 Enjoyment of life 0 What TIME of day is your pain at its worst? no pain Sleep (in general) Good  Pain is worse with: no pain Pain improves with: no pain Relief from Meds: no pain  Mobility ability to climb steps?  yes do you drive?  yes  Function retired  Neuro/Psych No problems in this area  Prior Studies Any changes since last visit?  no  Physicians involved in your care Any changes since last visit?  no   Family History  Problem Relation Age of Onset  . Hypertension Mother   .  Hypertension Father    Social History   Socioeconomic History  . Marital status: Married    Spouse name: Not on file  . Number of children: Not on file  . Years of education: Not on file  . Highest education level: Not on file  Occupational History  . Not on file  Social Needs  . Financial resource strain: Not on file  . Food insecurity:    Worry: Not on file    Inability: Not on file  . Transportation needs:    Medical: Not on file    Non-medical: Not on file  Tobacco Use  . Smoking status: Former Smoker    Packs/day: 0.50    Years: 15.00    Pack years: 7.50    Last attempt to quit: 1990    Years since quitting: 29.6  . Smokeless tobacco: Never Used  Substance and Sexual Activity  . Alcohol use: No    Alcohol/week: 0.0 standard drinks  . Drug use: No  . Sexual activity: Yes    Partners: Female    Birth control/protection: None  Lifestyle  . Physical activity:    Days per week: Not on file    Minutes per session: Not on file  . Stress: Not on file  Relationships  . Social connections:    Talks on phone: Not on file    Gets together: Not on file    Attends religious service: Not on file    Active member of club or organization: Not on file  Attends meetings of clubs or organizations: Not on file    Relationship status: Not on file  Other Topics Concern  . Not on file  Social History Narrative  . Not on file   Past Surgical History:  Procedure Laterality Date  . IR ANGIO INTRA EXTRACRAN SEL COM CAROTID INNOMINATE UNI L MOD SED  11/25/2016  . IR ANGIO VERTEBRAL SEL SUBCLAVIAN INNOMINATE UNI R MOD SED  11/25/2016  . IR ANGIO VERTEBRAL SEL VERTEBRAL UNI L MOD SED  11/25/2016  . IR INTRAVSC STENT CERV CAROTID W/O EMB-PROT MOD SED INC ANGIO  11/25/2016  . IR PERCUTANEOUS ART THROMBECTOMY/INFUSION INTRACRANIAL INC DIAG ANGIO  11/25/2016  . IR RADIOLOGIST EVAL & MGMT  02/13/2017  . RADIOLOGY WITH ANESTHESIA N/A 11/25/2016   Procedure: RADIOLOGY WITH ANESTHESIA;   Surgeon: Radiologist, Medication, MD;  Location: [facility] OR;  Service: Radiology;  Laterality: N/A;   Past Medical History:  Diagnosis Date  . Glaucoma   . Hepatitis C   . Stroke (HCC)    BP 120/63   Pulse (!) 50   Ht 5\' 10"  (1.778 m)   Wt 195 lb 6.4 oz (88.6 kg)   SpO2 96%   BMI 28.04 kg/m   Opioid Risk Score:   Fall Risk Score:  `1  Depression screen PHQ 2/9  Depression screen [facility] 2/9 12/11/2017 09/09/2017 05/28/2017 04/08/2017 12/31/2016 01/26/2016 10/27/2014  Decreased Interest 0 0 0 0 0 0 0  Down, Depressed, Hopeless 0 0 0 0 0 0 0  PHQ - 2 Score 0 0 0 0 0 0 0  Altered sleeping - - - - 0 - -  Tired, decreased energy - - - - 0 - -  Change in appetite - - - - 0 - -  Feeling bad or failure about yourself  - - - - 0 - -  Trouble concentrating - - - - 0 - -  Moving slowly or fidgety/restless - - - - 0 - -  Suicidal thoughts - - - - 0 - -  PHQ-9 Score - - - - 0 - -  Difficult doing work/chores - - - - Not difficult at all - -    Review of Systems  Constitutional: Negative.   HENT: Negative.   Eyes: Negative.   Respiratory: Negative.   Cardiovascular: Negative.   Gastrointestinal: Negative.   Endocrine: Negative.   Genitourinary: Negative.   Musculoskeletal: Negative.   Skin: Negative.   Allergic/Immunologic: Negative.   Neurological: Negative.   Hematological: Negative.   Psychiatric/Behavioral: Negative.   All other systems reviewed and are negative.      Objective:   Physical Exam  Constitutional: He is oriented to person, place, and time. He appears well-developed and well-nourished. No distress.  HENT:  Head: Normocephalic and atraumatic.  Eyes: Pupils are equal, round, and reactive to light. EOM are normal.  Neck: Normal range of motion.  Neurological: He is alert and oriented to person, place, and time. Coordination abnormal.  Strength is 4/5 in the left deltoid by stress of grip 5/5 in the left hip flexor knee extensor ankle dorsiflexor 5/5 in the right deltoid,  bicep, tricep, grip, hip flexor, knee extensor, ankle dorsiflexor Sensation intact light touch bilateral upper and lower limbs Fine motor is reduced left finger-nose-finger as well as finger thumb opposition as well as dysdiadochokinesis.  Skin: Skin is warm and dry. He is not diaphoretic.  Psychiatric: He has a normal mood and affect. His behavior is normal. Judgment and thought content  normal.  Nursing note and vitals reviewed.  Mild to moderate dysarthria       Assessment & Plan:  1.  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  He still has mild to moderate fine motor deficits as well as coordination problems with the left upper limb which are unlikely to improve at the current time. His dysarthria also appears to be stable. We discussed that it is important that he continues with his exercising on an outpatient basis.  He will be finishing up his OT in the next several weeks. Physical medicine rehab follow-up in 4 months

## 2017-12-16 ENCOUNTER — Ambulatory Visit: Payer: Medicare Other | Admitting: Occupational Therapy

## 2017-12-16 ENCOUNTER — Encounter: Payer: Self-pay | Admitting: Occupational Therapy

## 2017-12-16 DIAGNOSIS — R278 Other lack of coordination: Secondary | ICD-10-CM | POA: Diagnosis not present

## 2017-12-16 DIAGNOSIS — M6281 Muscle weakness (generalized): Secondary | ICD-10-CM | POA: Diagnosis not present

## 2017-12-17 NOTE — Therapy (Signed)
[facility] MAIN [facility] [address] [place], [place], [address] Phone: [phone]   Fax:  [phone]  Occupational Therapy Treatment  Patient Details  Name: Jason CroakJames Nolan MRN: [id number] Date of Birth: 21-Jul-1945 Referring Provider: Lorre MunroeBAITY, REGINA W   Encounter Date: 12/16/2017  OT End of Session - 12/19/17 1007    Visit Number  62    Number of Visits  72    Authorization Type  Medicare 3 of 10, reporting period starting 12/08/2017    OT Start Time  0930    OT Stop Time  1015    OT Time Calculation (min)  45 min    Activity Tolerance  Patient tolerated treatment well    Behavior During Therapy  [facility] for tasks assessed/performed       Past Medical History:  Diagnosis Date  . Glaucoma   . Hepatitis C   . Stroke [facility](HCC)     Past Surgical History:  Procedure Laterality Date  . IR ANGIO INTRA EXTRACRAN SEL COM CAROTID INNOMINATE UNI L MOD SED  11/25/2016  . IR ANGIO VERTEBRAL SEL SUBCLAVIAN INNOMINATE UNI R MOD SED  11/25/2016  . IR ANGIO VERTEBRAL SEL VERTEBRAL UNI L MOD SED  11/25/2016  . IR INTRAVSC STENT CERV CAROTID W/O EMB-PROT MOD SED INC ANGIO  11/25/2016  . IR PERCUTANEOUS ART THROMBECTOMY/INFUSION INTRACRANIAL INC DIAG ANGIO  11/25/2016  . IR RADIOLOGIST EVAL & MGMT  02/13/2017  . RADIOLOGY WITH ANESTHESIA N/A 11/25/2016   Procedure: RADIOLOGY WITH ANESTHESIA;  Surgeon: Radiologist, Medication, MD;  Location: [facility] OR;  Service: Radiology;  Laterality: N/A;    There were no vitals filed for this visit.  Subjective Assessment - 12/19/17 1006    Subjective   Patient reports he has continued to do exercises at home, "I can'Nolan sit still, I have to be doing something!"     Pertinent History  Pt. is a 72 y.o. 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 [facility]. He underwent CTA brain showed short segment near occlusion of proximal R-ICA with associated intraluminal thrombus --likely due to dissection  and emergent large vessel occlusion with right M2 occlusion and evolving right frontal lobe infarct.  He underwent cerebral angio with  complete revascularization of occluded superior division of R-MCA and near complete revascularization of proximal R-ICA with stent assisted angioplasty.  Stroke felt to be embolic in setting of ruptured plaque R-ICA and he was placed on ASA and brillinta due to stent. Pt. was transferred to inpatient rehab for several weeks, had home health therapy upon discharge, and now is ready for outpatient therapy services.    Limitations  Dominant LUE functioning, left hand edema, distal ROM, coordination, and overal strength.    Patient Stated Goals  To regain the use of his LUE.             Patient seen for coordination exercises with left UE. Manipulation of checkers from tabletop to pick up and then place into grid with cues for pretension patterns. Advanced to moving up to 3 items to palm with translatory movements of the hand and then using the hand for storage while using the radial side of the hand to manipulate and place items into grid.   Knotting exercises, forming knots and then unknotting with use of bilateral hands but left hand leading task with cues.                  OT  Education - 12/19/17 1006    Education provided  Yes    Education Details  using the hand for storage, translatory movements of the hands    Person(s) Educated  Patient    Methods  Explanation;Demonstration    Comprehension  Verbalized understanding;Returned demonstration          OT Long Term Goals - 12/04/17 1213      OT LONG TERM GOAL #3   Title  Pt. left hand grip with  increase by 5# in preparation for opening a jar.    Baseline  12/04/2017: improving    Time  12    Period  Weeks    Status  On-going    Target Date  02/26/18      Long Term Additional Goals   Additional Long Term Goals  Yes      OT LONG TERM GOAL #6   Title  Pt. will improve left hand  coordination skills to be able to button clothing.    Baseline  12/04/2017: Pt. has improved with left hand coordination skills, continues to have difficulty with buttoning.    Time  12    Period  Weeks    Status  On-going    Target Date  02/26/18      OT LONG TERM GOAL #8   Title  Pt. will increase Left pinch strength to be able to grasp and hold items during ADL tasks.    Baseline  12/04/2017: Pt. continues to progress with  pinch strength, and ability to grasp, howeevr has difficulty holding objects.    Time  12    Period  Weeks    Status  On-going    Target Date  02/26/18      OT LONG TERM GOAL  #9   Baseline  Pt. will write one sentence with 75% legibility and efficiently while maintaing grasp on pen 100% of the time.   3min. to write 7 words with a small pen grip, Pt. had to shift the pen in his fingers multiple times.   Time  12    Period  Weeks    Status  On-going    Target Date  02/26/18      OT LONG TERM GOAL  #10   TITLE  Pt. will independently use his left hand to grasp and efficiently accomodate to the varying weights of ADL objects during self care tasks.     Baseline  12/04/2017: Pt. continues to work on grasping and adjusting hand to the varying weights of objects.    Time  12    Period  Weeks    Status  On-going    Target Date  02/26/18      OT LONG TERM GOAL  #11   TITLE  Pt. will improve [facility] skills to be able to independently manipulate items for ADLs/IADLs when UEs are elevated when reaching into the closet, or microwave.    Baseline  12/04/2017: pt. has difficulty grasping, and manipulating objects when UEs elevated     Time  12    Period  Weeks    Status  New    Target Date  02/26/18            Plan - 12/19/17 1007    Clinical Impression Statement  Patient continues to focus on fine motor coordination skills, translatory movements of the hand and using the hand for storage for higher level hand skills.  He has shown good progress over the last few  weeks and is consistently working at home with daily exercises.  He reports an issue with his splint straps, requested he bring it in next session.  Continue to work towards goals to increase independence in daily tasks.     Occupational performance deficits (Please refer to evaluation for details):  ADL's;IADL's    Rehab Potential  Excellent    OT Frequency  2x / week    OT Duration  12 weeks    OT Treatment/Interventions  Self-care/ADL training;Therapeutic exercise;Neuromuscular education;Patient/family education;Energy conservation;Therapeutic activities;DME and/or AE instruction    Consulted and Agree with Plan of Care  Patient       Patient will benefit from skilled therapeutic intervention in order to improve the following deficits and impairments:  Abnormal gait, Pain, Decreased strength, Decreased range of motion, Impaired UE functional use, Decreased coordination, Decreased knowledge of use of DME, Decreased activity tolerance, Difficulty walking, Decreased balance, Other (comment)  Visit Diagnosis: Muscle weakness (generalized)  Other lack of coordination    Problem List Patient Active Problem List   Diagnosis Date Noted  . 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 Sore throat 09/19/2017  . Low vitamin B12 level 06/27/2017  . 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 Pure hypercholesterolemia 05/28/2017  . Erectile dysfunction 05/28/2017  . Left hemiparesis (HCC)   . Dysphagia 11/29/2016  . Essential hypertension 11/29/2016  . 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 Leukopenia 03/31/2016  . Hepatitis C 07/22/2013   Jason Nolan Arne Cleveland, OTR/L, CLT  Jason Nolan 12/19/2017, 10:10 AM  [facility] MAIN [facility] SERVICES [address] [place], [place], [address] Phone: [phone]   Fax:  [phone]  Name: Jason Nolan MRN: [id number] Date of Birth: 12/03/1945

## 2017-12-18 ENCOUNTER — Ambulatory Visit: Payer: Medicare Other | Admitting: Occupational Therapy

## 2017-12-18 ENCOUNTER — Encounter: Payer: Self-pay | Admitting: Occupational Therapy

## 2017-12-18 DIAGNOSIS — M6281 Muscle weakness (generalized): Secondary | ICD-10-CM

## 2017-12-18 DIAGNOSIS — R278 Other lack of coordination: Secondary | ICD-10-CM

## 2017-12-18 NOTE — Therapy (Signed)
[facility]IN [facility] [address] [place], [place], [address] Phone: [phone]   Fax:  [phone]  Occupational Therapy Treatment  Patient Details  Name: Jason CroakJames Nolan MRN: [id number] Date of Birth: 22-Apr-1946 Referring Provider: Lorre MunroeBAITY, REGINA W   Encounter Date: 12/18/2017  OT End of Session - 12/18/17 1218    Visit Number  63    Number of Visits  72    Date for OT Re-Evaluation  02/26/18    Authorization Type  Medicare 4 of 10, reporting period starting 12/08/2017    OT Start Time  1145    OT Stop Time  1230    OT Time Calculation (min)  45 min    Activity Tolerance  Patient tolerated treatment well    Behavior During Therapy  [facility] for tasks assessed/performed       Past Medical History:  Diagnosis Date  . Glaucoma   . Hepatitis C   . Stroke [facility](HCC)     Past Surgical History:  Procedure Laterality Date  . IR ANGIO INTRA EXTRACRAN SEL COM CAROTID INNOMINATE UNI L MOD SED  11/25/2016  . IR ANGIO VERTEBRAL SEL SUBCLAVIAN INNOMINATE UNI R MOD SED  11/25/2016  . IR ANGIO VERTEBRAL SEL VERTEBRAL UNI L MOD SED  11/25/2016  . IR INTRAVSC STENT CERV CAROTID W/O EMB-PROT MOD SED INC ANGIO  11/25/2016  . IR PERCUTANEOUS ART THROMBECTOMY/INFUSION INTRACRANIAL INC DIAG ANGIO  11/25/2016  . IR RADIOLOGIST EVAL & MGMT  02/13/2017  . RADIOLOGY WITH ANESTHESIA N/A 11/25/2016   Procedure: RADIOLOGY WITH ANESTHESIA;  Surgeon: Radiologist, Medication, MD;  Location: [facility] OR;  Service: Radiology;  Laterality: N/A;    There were no vitals filed for this visit.  Subjective Assessment - 12/18/17 1215    Subjective   Pt. brought his splint in.    Patient is accompained by:  Family member    Pertinent History  Pt. is a 72 y.o. 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 [facility] He underwent CTA brain showed short segment near occlusion of proximal R-ICA with associated intraluminal thrombus --likely due to  dissection and emergent large vessel occlusion with right M2 occlusion and evolving right frontal lobe infarct.  He underwent cerebral angio with  complete revascularization of occluded superior division of R-MCA and near complete revascularization of proximal R-ICA with stent assisted angioplasty.  Stroke felt to be embolic in setting of ruptured plaque R-ICA and he was placed on ASA and brillinta due to stent. Pt. was transferred to inpatient rehab for several weeks, had home health therapy upon discharge, and now is ready for outpatient therapy services.    Currently in Pain?  No/denies      OT TREATMENT    Neuro muscular re-education:  Pt. worked on hand function thumb opposition skills moving various sized objects through his hand from the palm to the tip of his 2nd digit, and thumb in preparation for placing the objects into the container. Pt. worked with quarters, flat marbles, and connector beads. Pt. Education was provided about thumb opposition tasks that he can do at home.                          OT Education - 12/18/17 1217    Education provided  Yes    Education Details  UE ther. ex.    Person(s) Educated  Patient    Methods  Explanation;Demonstration  Comprehension  Verbalized understanding;Returned demonstration          OT Long Term Goals - 12/04/17 1213      OT LONG TERM GOAL #3   Title  Pt. left hand grip with  increase by 5# in preparation for opening a jar.    Baseline  12/04/2017: improving    Time  12    Period  Weeks    Status  On-going    Target Date  02/26/18      Long Term Additional Goals   Additional Long Term Goals  Yes      OT LONG TERM GOAL #6   Title  Pt. will improve left hand coordination skills to be able to button clothing.    Baseline  12/04/2017: Pt. has improved with left hand coordination skills, continues to have difficulty with buttoning.    Time  12    Period  Weeks    Status  On-going    Target Date  02/26/18       OT LONG TERM GOAL #8   Title  Pt. will increase Left pinch strength to be able to grasp and hold items during ADL tasks.    Baseline  12/04/2017: Pt. continues to progress with  pinch strength, and ability to grasp, howeevr has difficulty holding objects.    Time  12    Period  Weeks    Status  On-going    Target Date  02/26/18      OT LONG TERM GOAL  #9   Baseline  Pt. will write one sentence with 75% legibility and efficiently while maintaing grasp on pen 100% of the time.   . to write 7 words with a small pen grip, Pt. had to shift the pen in his fingers multiple times.   Time  12    Period  Weeks    Status  On-going    Target Date  02/26/18      OT LONG TERM GOAL  #10   TITLE  Pt. will independently use his left hand to grasp and efficiently accomodate to the varying weights of ADL objects during self care tasks.     Baseline  12/04/2017: Pt. continues to work on grasping and adjusting hand to the varying weights of objects.    Time  12    Period  Weeks    Status  On-going    Target Date  02/26/18      OT LONG TERM GOAL  #11   TITLE  Pt. will improve [facility] skills to be able to independently manipulate items for ADLs/IADLs when UEs are elevated when reaching into the closet, or microwave.    Baseline  12/04/2017: pt. has difficulty grasping, and manipulating objects when UEs elevated     Time  12    Period  Weeks    Status  New    Target Date  02/26/18            Plan - 12/18/17 1222    Clinical Impression Statement  Pt. brought in his resting hand splint. Pt. was provided with velcro straps to temporarily hold the straps. Pt. was encouraged to call the splint company for an insert, and strap replacement. Pt. continues to focus on left hand function skills with emphasis on thumb opposition.    Occupational performance deficits (Please refer to evaluation for details):  ADL's;IADL's    Rehab Potential  Excellent    OT Frequency  2x / week  OT Duration  12 weeks     OT Treatment/Interventions  Self-care/ADL training;Therapeutic exercise;Neuromuscular education;Patient/family education;Energy conservation;Therapeutic activities;DME and/or AE instruction    Clinical Decision Making  Several treatment options, min-mod task modification necessary    Consulted and Agree with Plan of Care  Patient       Patient will benefit from skilled therapeutic intervention in order to improve the following deficits and impairments:  Abnormal gait, Pain, Decreased strength, Decreased range of motion, Impaired UE functional use, Decreased coordination, Decreased knowledge of use of DME, Decreased activity tolerance, Difficulty walking, Decreased balance, Other (comment)  Visit Diagnosis: Muscle weakness (generalized)  Other lack of coordination    Problem List Patient Active Problem List   Diagnosis Date Noted  . 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 Sore throat 09/19/2017  . Low vitamin B12 level 06/27/2017  . 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 Pure hypercholesterolemia 05/28/2017  . Erectile dysfunction 05/28/2017  . Left hemiparesis (HCC)   . Dysphagia 11/29/2016  . Essential hypertension 11/29/2016  . 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 Leukopenia 03/31/2016  . Hepatitis C 07/22/2013    Olegario MessierElaine Kawan [place], MS, OTR/L 12/18/2017, 12:33 PM  [facility]IN [facility] [address] [place], [place], [address] Phone: [phone]   Fax:  [phone]  Name: Jason CroakJames Nolan MRN: [id number] Date of Birth: 07-19-1945

## 2017-12-23 ENCOUNTER — Ambulatory Visit: Payer: Medicare Other | Admitting: Occupational Therapy

## 2017-12-23 ENCOUNTER — Encounter: Payer: Self-pay | Admitting: Occupational Therapy

## 2017-12-23 DIAGNOSIS — R278 Other lack of coordination: Secondary | ICD-10-CM | POA: Diagnosis not present

## 2017-12-23 DIAGNOSIS — M6281 Muscle weakness (generalized): Secondary | ICD-10-CM

## 2017-12-25 ENCOUNTER — Encounter: Payer: Self-pay | Admitting: Occupational Therapy

## 2017-12-25 ENCOUNTER — Ambulatory Visit: Payer: Medicare Other | Admitting: Occupational Therapy

## 2017-12-25 DIAGNOSIS — M6281 Muscle weakness (generalized): Secondary | ICD-10-CM | POA: Diagnosis not present

## 2017-12-25 DIAGNOSIS — R278 Other lack of coordination: Secondary | ICD-10-CM

## 2017-12-29 NOTE — Therapy (Signed)
[place] MAIN [facility] SERVICES [address] [place], [place], [address] Phone: [phone]   Fax:  [phone]  Occupational Therapy Treatment  Patient Details  Name: Jason Nolan MRN: [id number] Date of Birth: [birth year] Referring Provider: [name]   Encounter Date: 12/23/2017  OT End of Session - 12/29/17 2133    Visit Number  64    Number of Visits  72    Date for OT Re-Evaluation  02/26/18    Authorization Type  Medicare 5 of 10, reporting period starting 12/08/2017    OT Start Time  0930    OT Stop Time  1014    OT Time Calculation (min)  44 min    Activity Tolerance  Patient tolerated treatment well    Behavior During Therapy  [facility] for tasks assessed/performed       Past Medical History:  Diagnosis Date  . Glaucoma   . Hepatitis C   . Stroke [facility]     Past Surgical History:  Procedure Laterality Date  . IR ANGIO INTRA EXTRACRAN SEL COM CAROTID INNOMINATE UNI L MOD SED  11/25/2016  . IR ANGIO VERTEBRAL SEL SUBCLAVIAN INNOMINATE UNI R MOD SED  11/25/2016  . IR ANGIO VERTEBRAL SEL VERTEBRAL UNI L MOD SED  11/25/2016  . IR INTRAVSC STENT CERV CAROTID W/O EMB-PROT MOD SED INC ANGIO  11/25/2016  . IR PERCUTANEOUS ART THROMBECTOMY/INFUSION INTRACRANIAL INC DIAG ANGIO  11/25/2016  . IR RADIOLOGIST EVAL & MGMT  02/13/2017  . RADIOLOGY WITH ANESTHESIA N/A 11/25/2016   Procedure: RADIOLOGY WITH ANESTHESIA;  Surgeon: Radiologist, Medication, MD;  Location: [facility] OR;  Service: Radiology;  Laterality: N/A;    There were no vitals filed for this visit.          Patient seen for manipulation of small Magnets on easel board with cues for prehension patterns for picking up, then use of  translatory skills of the hand to move to palm and using palm for storage.  Attempting to separate magnets in hand to place one  at a time but demonstrates difficulty.  Manipulation of large magnets from the magnetic board but  has to tilt the magnet to  pull it off.    Use of larger dry erase marker to complete writing Os and loops on board for multiple trials and repetitions. Cues for size of letters  and tripod grasp on pen.                  OT Education - 12/29/17 2139    Education provided  Yes    Education Details  translatory movements, using hand for storage          OT Long Term Goals - 12/04/17 1213      OT LONG TERM GOAL #3   Title  Pt. left hand grip with  increase by 5# in preparation for opening a jar.    Baseline  12/04/2017: improving    Time  12    Period  Weeks    Status  On-going    Target Date  02/26/18      Long Term Additional Goals   Additional Long Term Goals  Yes      OT LONG TERM GOAL #6   Title  Pt. will improve left hand coordination skills to be able to button clothing.    Baseline  12/04/2017: Pt. has improved with left hand coordination skills, continues to have difficulty with buttoning.    Time  12    Period  Weeks    Status  On-going    Target Date  02/26/18      OT LONG TERM GOAL #8   Title  Pt. will increase Left pinch strength to be able to grasp and hold items during ADL tasks.    Baseline  12/04/2017: Pt. continues to progress with  pinch strength, and ability to grasp, howeevr has difficulty holding objects.    Time  12    Period  Weeks    Status  On-going    Target Date  02/26/18      OT LONG TERM GOAL  #9   Baseline  Pt. will write one sentence with 75% legibility and efficiently while maintaing grasp on pen 100% of the time.   3min. to write 7 words with a small pen grip, Pt. had to shift the pen in his fingers multiple times.   Time  12    Period  Weeks    Status  On-going    Target Date  02/26/18      OT LONG TERM GOAL  #10   TITLE  Pt. will independently use his left hand to grasp and efficiently accomodate to the varying weights of ADL objects during self care tasks.     Baseline  12/04/2017: Pt. continues to work on grasping and adjusting hand to the varying  weights of objects.    Time  12    Period  Weeks    Status  On-going    Target Date  02/26/18      OT LONG TERM GOAL  #11   TITLE  Pt. will improve [facility] skills to be able to independently manipulate items for ADLs/IADLs when UEs are elevated when reaching into the closet, or microwave.    Baseline  12/04/2017: pt. has difficulty grasping, and manipulating objects when UEs elevated     Time  12    Period  Weeks    Status  New    Target Date  02/26/18            Plan - 12/29/17 2133    Clinical Impression Statement  Patient demonstrates difficulty with separating magnets in hand to place onto board, if they are separated he can put them on with cues for prehension patterns.  Patient continues to require cues for prehension patterns, oppositional movements, translatory movements of the hand and using the hand for storage.  He is continuing to work towards these skills at home and in the clinic and is making progress but still requires attention to improve this skill to impact his functional use of left hand for daily tasks.     Occupational performance deficits (Please refer to evaluation for details):  ADL's;IADL's    Rehab Potential  Excellent    OT Frequency  2x / week    OT Duration  12 weeks    OT Treatment/Interventions  Self-care/ADL training;Therapeutic exercise;Neuromuscular education;Patient/family education;Energy conservation;Therapeutic activities;DME and/or AE instruction    Consulted and Agree with Plan of Care  Patient       Patient will benefit from skilled therapeutic intervention in order to improve the following deficits and impairments:  Abnormal gait, Pain, Decreased strength, Decreased range of motion, Impaired UE functional use, Decreased coordination, Decreased knowledge of use of DME, Decreased activity tolerance, Difficulty walking, Decreased balance, Other (comment)  Visit Diagnosis: Muscle weakness (generalized)  Other lack of coordination    Problem  List Patient Active Problem List   Diagnosis  Date Noted  . 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 Sore throat 09/19/2017  . Low vitamin B12 level 06/27/2017  . 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 Pure hypercholesterolemia 05/28/2017  . Erectile dysfunction 05/28/2017  . Left hemiparesis (HCC)   . Dysphagia 11/29/2016  . Essential hypertension 11/29/2016  . 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 Leukopenia 03/31/2016  . Hepatitis C 07/22/2013   Amy T Arne Cleveland, OTR/L, CLT  Lovett,Amy 12/29/2017, 9:40 PM  [place] [facility] MAIN [facility] SERVICES [address] [place], [place], [address] Phone: [phone]   Fax:  [phone]  Name: Trejan Buda MRN: [id number] Date of Birth: [birth year]

## 2017-12-29 NOTE — Therapy (Signed)
[facility] [address] [place], [place], [address] Phone: [phone]   Fax:  [phone]  Occupational Therapy Treatment  Patient Details  Name: Jason CroakJames Nolan MRN: [id number] Date of Birth: 1945-11-17 Referring Provider: Lorre MunroeBAITY, REGINA W   Encounter Date: 12/25/2017  OT End of Session - 12/29/17 2201    Visit Number  65    Number of Visits  72    Date for OT Re-Evaluation  02/26/18    Authorization Type  Medicare 6 of 10, reporting period starting 12/08/2017    OT Start Time  1015    OT Stop Time  1101    OT Time Calculation (min)  46 min    Activity Tolerance  Patient tolerated treatment well    Behavior During Therapy  [facility] for tasks assessed/performed       Past Medical History:  Diagnosis Date  . Glaucoma   . Hepatitis C   . Stroke [facility](HCC)     Past Surgical History:  Procedure Laterality Date  . IR ANGIO INTRA EXTRACRAN SEL COM CAROTID INNOMINATE UNI L MOD SED  11/25/2016  . IR ANGIO VERTEBRAL SEL SUBCLAVIAN INNOMINATE UNI R MOD SED  11/25/2016  . IR ANGIO VERTEBRAL SEL VERTEBRAL UNI L MOD SED  11/25/2016  . IR INTRAVSC STENT CERV CAROTID W/O EMB-PROT MOD SED INC ANGIO  11/25/2016  . IR PERCUTANEOUS ART THROMBECTOMY/INFUSION INTRACRANIAL INC DIAG ANGIO  11/25/2016  . IR RADIOLOGIST EVAL & MGMT  02/13/2017  . RADIOLOGY WITH ANESTHESIA N/A 11/25/2016   Procedure: RADIOLOGY WITH ANESTHESIA;  Surgeon: Radiologist, Medication, MD;  Location: [facility] OR;  Service: Radiology;  Laterality: N/A;    There were no vitals filed for this visit.  Subjective Assessment - 12/29/17 2200    Subjective   Patient reports he has seen progress in his hand, just slower than he would like.  Has continued to do exercises at home on a daily basis and using hand as much as possible.     Pertinent History  Pt. is a 72 y.o. 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 [facility] He underwent CTA brain showed short  segment near occlusion of proximal R-ICA with associated intraluminal thrombus --likely due to dissection and emergent large vessel occlusion with right M2 occlusion and evolving right frontal lobe infarct.  He underwent cerebral angio with  complete revascularization of occluded superior division of R-MCA and near complete revascularization of proximal R-ICA with stent assisted angioplasty.  Stroke felt to be embolic in setting of ruptured plaque R-ICA and he was placed on ASA and brillinta due to stent. Pt. was transferred to inpatient rehab for several weeks, had home health therapy upon discharge, and now is ready for outpatient therapy services.    Limitations  Dominant LUE functioning, left hand edema, distal ROM, coordination, and overal strength.    Patient Stated Goals  To regain the use of his LUE.    Currently in Pain?  No/denies    Pain Score  0-No pain       Left UE  Patient seen for manipulation of small Magnets on easel board with cues for prehension patterns for picking up,  then use of translatory skills of the hand to move to palm and using palm for storage.  Attempting to separate magnets  in hand to place one at a time.    Manipulation of larger magnets to reach and place onto magnetic board and then  remove with left hand, cues for technique.   Grip strengthening tasks with resistive putty for grip, pinch and pull to pick out small 1/2 inch objects  Oppositional movements of the thumb to each digit and to the base of the thumb, picking up objects from table  With use of opposition, alternating with each repetition.                   OT Education - 12/29/17 2201    Education provided  Yes    Education Details  functional use of hand, prehension patterns, oppositional movements    Methods  Explanation    Comprehension  Verbalized understanding;Returned demonstration          OT Long Term Goals - 12/29/17 2203      OT LONG TERM GOAL #3   Title  Pt.  left hand grip with  increase by 5# in preparation for opening a jar.    Baseline  12/04/2017: improving    Time  12    Period  Weeks    Status  On-going      OT LONG TERM GOAL #6   Title  Pt. will improve left hand coordination skills to be able to button clothing.    Baseline  12/04/2017: Pt. has improved with left hand coordination skills, continues to have difficulty with buttoning.    Time  12    Period  Weeks    Status  On-going      OT LONG TERM GOAL #8   Title  Pt. will increase Left pinch strength to be able to grasp and hold items during ADL tasks.    Baseline  12/04/2017: Pt. continues to progress with  pinch strength, and ability to grasp, howeevr has difficulty holding objects.    Time  12    Period  Weeks    Status  On-going      OT LONG TERM GOAL  #9   Baseline  Pt. will write one sentence with 75% legibility and efficiently while maintaing grasp on pen 100% of the time.   . to write 7 words with a small pen grip, Pt. had to shift the pen in his fingers multiple times.   Time  12    Period  Weeks    Status  On-going      OT LONG TERM GOAL  #10   TITLE  Pt. will independently use his left hand to grasp and efficiently accomodate to the varying weights of ADL objects during self care tasks.     Baseline  12/04/2017: Pt. continues to work on grasping and adjusting hand to the varying weights of objects.    Time  12    Period  Weeks    Status  On-going      OT LONG TERM GOAL  #11   TITLE  Pt. will improve [facility] skills to be able to independently manipulate items for ADLs/IADLs when UEs are elevated when reaching into the closet, or microwave.    Baseline  12/04/2017: pt. has difficulty grasping, and manipulating objects when UEs elevated     Time  12    Period  Weeks    Status  New            Plan - 12/29/17 2202    Occupational performance deficits (Please refer to evaluation for details):  ADL's;IADL's    Rehab Potential  Excellent    OT Frequency  2x /  week    OT  Duration  12 weeks    OT Treatment/Interventions  Self-care/ADL training;Therapeutic exercise;Neuromuscular education;Patient/family education;Energy conservation;Therapeutic activities;DME and/or AE instruction    Consulted and Agree with Plan of Care  Patient       Patient will benefit from skilled therapeutic intervention in order to improve the following deficits and impairments:  Abnormal gait, Pain, Decreased strength, Decreased range of motion, Impaired UE functional use, Decreased coordination, Decreased knowledge of use of DME, Decreased activity tolerance, Difficulty walking, Decreased balance, Other (comment)  Visit Diagnosis: Muscle weakness (generalized)  Other lack of coordination    Problem List Patient Active Problem List   Diagnosis Date Noted  . 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 Sore throat 09/19/2017  . Low vitamin B12 level 06/27/2017  . 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 Pure hypercholesterolemia 05/28/2017  . Erectile dysfunction 05/28/2017  . Left hemiparesis (HCC)   . Dysphagia 11/29/2016  . Essential hypertension 11/29/2016  . 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 Leukopenia 03/31/2016  . Hepatitis C 07/22/2013   Amy T Arne Cleveland, OTR/L, CLT  Lovett,Amy 12/29/2017, 10:04 PM   [facility] MAIN [facility] SERVICES [address] [place], [place], [address] Phone: [phone]   Fax:  [phone]  Name: Suraj Ramdass MRN: [id number] Date of Birth: 1946/03/12

## 2017-12-30 ENCOUNTER — Ambulatory Visit: Payer: Medicare Other | Admitting: Occupational Therapy

## 2017-12-30 ENCOUNTER — Encounter: Payer: Self-pay | Admitting: Occupational Therapy

## 2017-12-30 DIAGNOSIS — M6281 Muscle weakness (generalized): Secondary | ICD-10-CM | POA: Diagnosis not present

## 2017-12-30 DIAGNOSIS — R278 Other lack of coordination: Secondary | ICD-10-CM | POA: Diagnosis not present

## 2017-12-30 NOTE — Therapy (Signed)
[place] [facility] [address] [place], [place], [address] Phone: [phone]   Fax:  [phone]  Occupational Therapy Treatment  Patient Details  Name: Jason Nolan MRN: [id number] Date of Birth: January 25, 1946 Referring Provider: Lorre MunroeBAITY, Jason W   Encounter Date: 12/30/2017  OT End of Session - 12/30/17 1213    Visit Number  66    Number of Visits  72    Date for OT Re-Evaluation  02/26/18    Authorization Type  Medicare 7 of 10, reporting period starting 12/08/2017    OT Start Time  1153    OT Stop Time  1235    OT Time Calculation (min)  42 min    Activity Tolerance  Patient tolerated treatment well    Behavior During Therapy  [facility] for tasks assessed/performed       Past Medical History:  Diagnosis Date  . Glaucoma   . Hepatitis C   . Stroke [facility](HCC)     Past Surgical History:  Procedure Laterality Date  . IR ANGIO INTRA EXTRACRAN SEL COM CAROTID INNOMINATE UNI L MOD SED  11/25/2016  . IR ANGIO VERTEBRAL SEL SUBCLAVIAN INNOMINATE UNI R MOD SED  11/25/2016  . IR ANGIO VERTEBRAL SEL VERTEBRAL UNI L MOD SED  11/25/2016  . IR INTRAVSC STENT CERV CAROTID W/O EMB-PROT MOD SED INC ANGIO  11/25/2016  . IR PERCUTANEOUS ART THROMBECTOMY/INFUSION INTRACRANIAL INC DIAG ANGIO  11/25/2016  . IR RADIOLOGIST EVAL & MGMT  02/13/2017  . RADIOLOGY WITH ANESTHESIA N/A 11/25/2016   Procedure: RADIOLOGY WITH ANESTHESIA;  Surgeon: Radiologist, Medication, MD;  Location: [facility] OR;  Service: Radiology;  Laterality: N/A;    There were no vitals filed for this visit.  Subjective Assessment - 12/30/17 1209    Subjective   Pt. has made progress overall.     Patient is accompained by:  Family member    Pertinent History  Pt. is a 72 y.o. 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 [facility]. He underwent CTA brain showed short segment near occlusion of proximal R-ICA with associated intraluminal thrombus --likely due to  dissection and emergent large vessel occlusion with right M2 occlusion and evolving right frontal lobe infarct.  He underwent cerebral angio with  complete revascularization of occluded superior division of R-MCA and near complete revascularization of proximal R-ICA with stent assisted angioplasty.  Stroke felt to be embolic in setting of ruptured plaque R-ICA and he was placed on ASA and brillinta due to stent. Pt. was transferred to inpatient rehab for several weeks, had home health therapy upon discharge, and now is ready for outpatient therapy services.    Limitations  Dominant LUE functioning, left hand edema, distal ROM, coordination, and overal strength.    Patient Stated Goals  To regain the use of his LUE.    Currently in Pain?  No/denies      OT TREATMENT    Neuro muscular re-education:  Pt. performed [facility] skills training to improve speed and dexterity needed for ADL tasks and writing. Pt. demonstrated grasping 1 inch sticks,  inch cylindrical collars, and  inch flat washers on the Purdue pegboard. Pt. performed grasping the sticks with his 2nd digit and thumb, and worked on storing them in the palm, and moving them through his hand. Pt. presented with difficulty storing  inch objects in the palmar aspect of his hand. Pt. Worked on grasping the small objects from a magnetic dish to stabilize  to objects.                         OT Education - 12/30/17 1212    Education provided  Yes    Education Details  Left hand [facility] skills, and hand function.    Person(s) Educated  Patient    Methods  Explanation    Comprehension  Verbalized understanding;Returned demonstration          OT Long Term Goals - 12/29/17 2203      OT LONG TERM GOAL #3   Title  Pt. left hand grip with  increase by 5# in preparation for opening a jar.    Baseline  12/04/2017: improving    Time  12    Period  Weeks    Status  On-going      OT LONG TERM GOAL #6   Title  Pt. will improve left  hand coordination skills to be able to button clothing.    Baseline  12/04/2017: Pt. has improved with left hand coordination skills, continues to have difficulty with buttoning.    Time  12    Period  Weeks    Status  On-going      OT LONG TERM GOAL #8   Title  Pt. will increase Left pinch strength to be able to grasp and hold items during ADL tasks.    Baseline  12/04/2017: Pt. continues to progress with  pinch strength, and ability to grasp, howeevr has difficulty holding objects.    Time  12    Period  Weeks    Status  On-going      OT LONG TERM GOAL  #9   Baseline  Pt. will write one sentence with 75% legibility and efficiently while maintaing grasp on pen 100% of the time.   . to write 7 words with a small pen grip, Pt. had to shift the pen in his fingers multiple times.   Time  12    Period  Weeks    Status  On-going      OT LONG TERM GOAL  #10   TITLE  Pt. will independently use his left hand to grasp and efficiently accomodate to the varying weights of ADL objects during self care tasks.     Baseline  12/04/2017: Pt. continues to work on grasping and adjusting hand to the varying weights of objects.    Time  12    Period  Weeks    Status  On-going      OT LONG TERM GOAL  #11   TITLE  Pt. will improve [facility] skills to be able to independently manipulate items for ADLs/IADLs when UEs are elevated when reaching into the closet, or microwave.    Baseline  12/04/2017: pt. has difficulty grasping, and manipulating objects when UEs elevated     Time  12    Period  Weeks    Status  New            Plan - 12/30/17 1218    Clinical Impression Statement  Pt. reports that he continues to work with his hand at home, and trying to engage his hand more during ADL, and IADL tasks.  Pt. continues to present with limited left hand fine motor coordination skills, limited hand function skills, and translatory movements of the hand. Pt. continues to require work on improving hand function  skills for ADL, and IADL functioning.     Occupational performance deficits (Please  refer to evaluation for details):  ADL's;IADL's    Rehab Potential  Excellent    OT Frequency  2x / week    OT Duration  12 weeks    OT Treatment/Interventions  Self-care/ADL training;Therapeutic exercise;Neuromuscular education;Patient/family education;Energy conservation;Therapeutic activities;DME and/or AE instruction    Clinical Decision Making  Several treatment options, min-mod task modification necessary    Consulted and Agree with Plan of Care  Patient       Patient will benefit from skilled therapeutic intervention in order to improve the following deficits and impairments:  Abnormal gait, Pain, Decreased strength, Decreased range of motion, Impaired UE functional use, Decreased coordination, Decreased knowledge of use of DME, Decreased activity tolerance, Difficulty walking, Decreased balance, Other (comment)  Visit Diagnosis: Muscle weakness (generalized)  Other lack of coordination    Problem List Patient Active Problem List   Diagnosis Date Noted  . 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 Sore throat 09/19/2017  . Low vitamin B12 level 06/27/2017  . 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 Pure hypercholesterolemia 05/28/2017  . Erectile dysfunction 05/28/2017  . Left hemiparesis (HCC)   . Dysphagia 11/29/2016  . Essential hypertension 11/29/2016  . 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 Leukopenia 03/31/2016  . Hepatitis C 07/22/2013    Olegario Messier, MS, OTR/L 12/30/2017, 12:45 PM  [place] [facility] MAIN [facility] SERVICES [address] [place], [place], [address] Phone: [phone]   Fax:  [phone]  Name: Lyden Redner MRN: [id number] Date of Birth: Jun 24, 1945

## 2018-01-01 ENCOUNTER — Ambulatory Visit: Payer: Medicare Other | Admitting: Occupational Therapy

## 2018-01-01 DIAGNOSIS — M6281 Muscle weakness (generalized): Secondary | ICD-10-CM | POA: Diagnosis not present

## 2018-01-01 DIAGNOSIS — R278 Other lack of coordination: Secondary | ICD-10-CM

## 2018-01-03 ENCOUNTER — Encounter: Payer: Self-pay | Admitting: Occupational Therapy

## 2018-01-03 NOTE — Therapy (Signed)
[facility] MAIN [facility] SERVICES [address] [place], [place], [address] Phone: [phone]   Fax:  [phone]  Occupational Therapy Treatment  Patient Details  Name: Jason Nolan MRN: [id number] Date of Birth: 1945/06/24 Referring Provider: [name]   Encounter Date: 01/01/2018  OT End of Session - 01/03/18 0909    Visit Number  67    Number of Visits  72    Date for OT Re-Evaluation  02/26/18    Authorization Type  Medicare 8 of 10, reporting period starting 12/08/2017    OT Start Time  0829    OT Stop Time  0915    OT Time Calculation (min)  46 min    Activity Tolerance  Patient tolerated treatment well    Behavior During Therapy  [facility] for tasks assessed/performed       Past Medical History:  Diagnosis Date  . Glaucoma   . Hepatitis C   . Stroke [facility])     Past Surgical History:  Procedure Laterality Date  . IR ANGIO INTRA EXTRACRAN SEL COM CAROTID INNOMINATE UNI L MOD SED  11/25/2016  . IR ANGIO VERTEBRAL SEL SUBCLAVIAN INNOMINATE UNI R MOD SED  11/25/2016  . IR ANGIO VERTEBRAL SEL VERTEBRAL UNI L MOD SED  11/25/2016  . IR INTRAVSC STENT CERV CAROTID W/O EMB-PROT MOD SED INC ANGIO  11/25/2016  . IR PERCUTANEOUS ART THROMBECTOMY/INFUSION INTRACRANIAL INC DIAG ANGIO  11/25/2016  . IR RADIOLOGIST EVAL & MGMT  02/13/2017  . RADIOLOGY WITH ANESTHESIA N/A 11/25/2016   Procedure: RADIOLOGY WITH ANESTHESIA;  Surgeon: Radiologist, Medication, MD;  Location: [facility] OR;  Service: Radiology;  Laterality: N/A;    There were no vitals filed for this visit.  Subjective Assessment - 01/03/18 0906    Subjective   Daughter is coming in today from [place] and then going to [place] to a family reunion til Sunday.  He reports he Can put on watch now with left hand,  he Can also hold vacuum cleaner stable.    Pertinent History  Pt. is a 72 y.o. 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 Pt. was transferred to [facility]. He underwent CTA brain  showed short segment near occlusion of proximal R-ICA with associated intraluminal thrombus --likely due to dissection and emergent large vessel occlusion with right M2 occlusion and evolving right frontal lobe infarct.  He underwent cerebral angio with  complete revascularization of occluded superior division of R-MCA and near complete revascularization of proximal R-ICA with stent assisted angioplasty.  Stroke felt to be embolic in setting of ruptured plaque R-ICA and he was placed on ASA and brillinta due to stent. Pt. was transferred to inpatient rehab for several weeks, had home health therapy upon discharge, and now is ready for outpatient therapy services.    Limitations  Dominant LUE functioning, left hand edema, distal ROM, coordination, and overal strength.    Patient Stated Goals  To regain the use of his LUE.    Currently in Pain?  No/denies    Pain Score  0-No pain          Reassessment of Grip strength this date, right hand 80# , left hand 39#.  Left 9 hole peg test 58 secs Grip strengthening for sustained grip, 23.4#, 28.9# for 25 reps each, occasional cues for placement of left  hand onto gripper for optimal performance.  Manipulation of Ball pegs with focus on thumb and index, translatory movements, holding up to 3 pieces  at  a time in his palm when using the hand for storage.  Cues for manipulation skills to turn item to place into grid.                  OT Education - 01/03/18 0908    Education provided  Yes    Education Details  progress, measurements, HEP    Person(s) Educated  Patient    Methods  Explanation    Comprehension  Verbalized understanding;Returned demonstration          OT Long Term Goals - 12/29/17 2203      OT LONG TERM GOAL #3   Title  Pt. left hand grip with  increase by 5# in preparation for opening a jar.    Baseline  12/04/2017: improving    Time  12    Period  Weeks    Status  On-going      OT LONG TERM GOAL #6   Title   Pt. will improve left hand coordination skills to be able to button clothing.    Baseline  12/04/2017: Pt. has improved with left hand coordination skills, continues to have difficulty with buttoning.    Time  12    Period  Weeks    Status  On-going      OT LONG TERM GOAL #8   Title  Pt. will increase Left pinch strength to be able to grasp and hold items during ADL tasks.    Baseline  12/04/2017: Pt. continues to progress with  pinch strength, and ability to grasp, howeevr has difficulty holding objects.    Time  12    Period  Weeks    Status  On-going      OT LONG TERM GOAL  #9   Baseline  Pt. will write one sentence with 75% legibility and efficiently while maintaing grasp on pen 100% of the time.   . to write 7 words with a small pen grip, Pt. had to shift the pen in his fingers multiple times.   Time  12    Period  Weeks    Status  On-going      OT LONG TERM GOAL  #10   TITLE  Pt. will independently use his left hand to grasp and efficiently accomodate to the varying weights of ADL objects during self care tasks.     Baseline  12/04/2017: Pt. continues to work on grasping and adjusting hand to the varying weights of objects.    Time  12    Period  Weeks    Status  On-going      OT LONG TERM GOAL  #11   TITLE  Pt. will improve [facility] skills to be able to independently manipulate items for ADLs/IADLs when UEs are elevated when reaching into the closet, or microwave.    Baseline  12/04/2017: pt. has difficulty grasping, and manipulating objects when UEs elevated     Time  12    Period  Weeks    Status  New            Plan - 01/03/18 0910    Clinical Impression Statement  Patient has made significant progress in hand function over the last 4 weeks or less.  His grip strength has improved significantly and coordination has improved.  He is able to see progress at home with being able to hold the vacuum handle with a more secure grip to vacuum the floor as well as being able to  now put on his watch to right wrist with his left hand to open and close the latch.  Continue to work on skills to improve left hand function and increase independence in daily tasks     Occupational performance deficits (Please refer to evaluation for details):  ADL's;IADL's    Rehab Potential  Excellent    OT Frequency  2x / week    OT Duration  12 weeks    OT Treatment/Interventions  Self-care/ADL training;Therapeutic exercise;Neuromuscular education;Patient/family education;Energy conservation;Therapeutic activities;DME and/or AE instruction    Consulted and Agree with Plan of Care  Patient       Patient will benefit from skilled therapeutic intervention in order to improve the following deficits and impairments:  Abnormal gait, Pain, Decreased strength, Decreased range of motion, Impaired UE functional use, Decreased coordination, Decreased knowledge of use of DME, Decreased activity tolerance, Difficulty walking, Decreased balance, Other (comment)  Visit Diagnosis: Muscle weakness (generalized)  Other lack of coordination    Problem List Patient Active Problem List   Diagnosis Date Noted  . 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 Sore throat 09/19/2017  . Low vitamin B12 level 06/27/2017  . 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 Pure hypercholesterolemia 05/28/2017  . Erectile dysfunction 05/28/2017  . Left hemiparesis (HCC)   . Dysphagia 11/29/2016  . Essential hypertension 11/29/2016  . 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 Leukopenia 03/31/2016  . Hepatitis C 07/22/2013   Amy T Arne ClevelandLovett, OTR/L, CLT  Lovett,Amy 01/03/2018, 9:20 AM  [place] [facility] MAIN [facility]HAB SERVICES [address] [place], [place], [address] Phone: [phone]   Fax:  [phone]  Name: Jason CroakJames Nolan MRN: [id number] Date of Birth:  1945-05-23

## 2018-01-06 ENCOUNTER — Ambulatory Visit: Payer: Medicare Other | Attending: Internal Medicine | Admitting: Occupational Therapy

## 2018-01-06 ENCOUNTER — Encounter: Payer: Self-pay | Admitting: Occupational Therapy

## 2018-01-06 DIAGNOSIS — M6281 Muscle weakness (generalized): Secondary | ICD-10-CM | POA: Diagnosis not present

## 2018-01-06 DIAGNOSIS — R278 Other lack of coordination: Secondary | ICD-10-CM | POA: Diagnosis not present

## 2018-01-06 NOTE — Therapy (Signed)
[facility] MAIN [facility] SERVICES [address] [place], [place], [address] Phone: [phone]   Fax:  [phone]  Occupational Therapy Treatment  Patient Details  Name: Jason Nolan MRN: [id number] Date of Birth: June 17, 1945 Referring Provider: [name]   Encounter Date: 01/06/2018  OT End of Session - 01/06/18 0949    Visit Number  68    Number of Visits  72    Date for OT Re-Evaluation  02/26/18    Authorization Type  Medicare 9 of 10, reporting period starting 12/08/2017    OT Start Time  0933    OT Stop Time  1015    OT Time Calculation (min)  42 min    Activity Tolerance  Patient tolerated treatment well    Behavior During Therapy  [facility] for tasks assessed/performed       Past Medical History:  Diagnosis Date  . Glaucoma   . Hepatitis C   . Stroke [facility])     Past Surgical History:  Procedure Laterality Date  . IR ANGIO INTRA EXTRACRAN SEL COM CAROTID INNOMINATE UNI L MOD SED  11/25/2016  . IR ANGIO VERTEBRAL SEL SUBCLAVIAN INNOMINATE UNI R MOD SED  11/25/2016  . IR ANGIO VERTEBRAL SEL VERTEBRAL UNI L MOD SED  11/25/2016  . IR INTRAVSC STENT CERV CAROTID W/O EMB-PROT MOD SED INC ANGIO  11/25/2016  . IR PERCUTANEOUS ART THROMBECTOMY/INFUSION INTRACRANIAL INC DIAG ANGIO  11/25/2016  . IR RADIOLOGIST EVAL & MGMT  02/13/2017  . RADIOLOGY WITH ANESTHESIA N/A 11/25/2016   Procedure: RADIOLOGY WITH ANESTHESIA;  Surgeon: Radiologist, Medication, MD;  Location: [facility] OR;  Service: Radiology;  Laterality: N/A;    There were no vitals filed for this visit.  Subjective Assessment - 01/06/18 0937    Subjective   Pt.reports that he went to [facility] this past we[place] for a family reunion.    Patient is accompained by:  Family member    Pertinent History  Pt. is a 72 y.o. 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 Pt. was transferred to [facility]. He underwent CTA brain showed short segment near occlusion of proximal R-ICA with  associated intraluminal thrombus --likely due to dissection and emergent large vessel occlusion with right M2 occlusion and evolving right frontal lobe infarct.  He underwent cerebral angio with  complete revascularization of occluded superior division of R-MCA and near complete revascularization of proximal R-ICA with stent assisted angioplasty.  Stroke felt to be embolic in setting of ruptured plaque R-ICA and he was placed on ASA and brillinta due to stent. Pt. was transferred to inpatient rehab for several weeks, had home health therapy upon discharge, and now is ready for outpatient therapy services.    Limitations  Dominant LUE functioning, left hand edema, distal ROM, coordination, and overal strength.    Patient Stated Goals  To regain the use of his LUE.    Currently in Pain?  No/denies       OT TREATMENT    Neuro muscular re-education:  Pt. worked on grasping, and storing 1/2"  flat marbles in the palm of the right hand. Pt. worked on translatory movements moving the objects from his palm to the tip of his 2nd digit, and thumb. Pt. performed [facility] skills training to improve speed and dexterity needed for ADL tasks and writing. Pt. demonstrated grasping 1 inch sticks,  inch cylindrical collars, and  inch flat washers on the Purdue pegboard. Pt. performed grasping the sticks with his 2nd  digit and thumb, and worked on storing them in the palm, and moving them through his hand. Pt. presented with difficulty storing  inch objects in the palmar aspect of his hand. Pt. worked on grasping the small objects from a magnetic dish to stabilize the small collars.                     OT Education - 01/06/18 [phone]    Education provided  Yes    Education Details  progress, measurements, HEP    Person(s) Educated  Patient    Methods  Explanation    Comprehension  Verbalized understanding;Returned demonstration          OT Long Term Goals - 12/29/17 2203      OT LONG TERM GOAL  #3   Title  Pt. left hand grip with  increase by 5# in preparation for opening a jar.    Baseline  12/04/2017: improving    Time  12    Period  Weeks    Status  On-going      OT LONG TERM GOAL #6   Title  Pt. will improve left hand coordination skills to be able to button clothing.    Baseline  12/04/2017: Pt. has improved with left hand coordination skills, continues to have difficulty with buttoning.    Time  12    Period  Weeks    Status  On-going      OT LONG TERM GOAL #8   Title  Pt. will increase Left pinch strength to be able to grasp and hold items during ADL tasks.    Baseline  12/04/2017: Pt. continues to progress with  pinch strength, and ability to grasp, howeevr has difficulty holding objects.    Time  12    Period  Weeks    Status  On-going      OT LONG TERM GOAL  #9   Baseline  Pt. will write one sentence with 75% legibility and efficiently while maintaing grasp on pen 100% of the time.   . to write 7 words with a small pen grip, Pt. had to shift the pen in his fingers multiple times.   Time  12    Period  Weeks    Status  On-going      OT LONG TERM GOAL  #10   TITLE  Pt. will independently use his left hand to grasp and efficiently accomodate to the varying weights of ADL objects during self care tasks.     Baseline  12/04/2017: Pt. continues to work on grasping and adjusting hand to the varying weights of objects.    Time  12    Period  Weeks    Status  On-going      OT LONG TERM GOAL  #11   TITLE  Pt. will improve [facility] skills to be able to independently manipulate items for ADLs/IADLs when UEs are elevated when reaching into the closet, or microwave.    Baseline  12/04/2017: pt. has difficulty grasping, and manipulating objects when UEs elevated     Time  12    Period  Weeks    Status  New            Plan - 01/06/18 0949    Clinical Impression Statement Pt. continues to make progress with left hand function, and [facility] tasks. Pt. continues to work on  improving the development of [facility] skills in the right hand with emphasis on thumb movements, thumb  opposition, and translatory movements of the hand.       Occupational performance deficits (Please refer to evaluation for details):  ADL's;IADL's    Rehab Potential  Excellent    OT Frequency  2x / week    OT Duration  12 weeks    OT Treatment/Interventions  Self-care/ADL training;Therapeutic exercise;Neuromuscular education;Patient/family education;Energy conservation;Therapeutic activities;DME and/or AE instruction    Clinical Decision Making  Several treatment options, min-mod task modification necessary    Consulted and Agree with Plan of Care  Patient       Patient will benefit from skilled therapeutic intervention in order to improve the following deficits and impairments:  Abnormal gait, Pain, Decreased strength, Decreased range of motion, Impaired UE functional use, Decreased coordination, Decreased knowledge of use of DME, Decreased activity tolerance, Difficulty walking, Decreased balance, Other (comment)  Visit Diagnosis: Muscle weakness (generalized)  Other lack of coordination    Problem List Patient Active Problem List   Diagnosis Date Noted  . 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 Sore throat 09/19/2017  . Low vitamin B12 level 06/27/2017  . 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 Pure hypercholesterolemia 05/28/2017  . Erectile dysfunction 05/28/2017  . Left hemiparesis (HCC)   . Dysphagia 11/29/2016  . Essential hypertension 11/29/2016  . 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 Leukopenia 03/31/2016  . Hepatitis C 07/22/2013    Olegario Messier, MS, OTR/L 01/06/2018, 9:58 AM  [place] [facility] MAIN [facility] SERVICES [address] [place], [place], [address] Phone: [phone]   Fax:  [phone]  Name:  Jason Nolan MRN: [id number] Date of Birth: 1946-03-24

## 2018-01-08 ENCOUNTER — Ambulatory Visit: Payer: Medicare Other | Admitting: Occupational Therapy

## 2018-01-08 ENCOUNTER — Encounter: Payer: Self-pay | Admitting: Occupational Therapy

## 2018-01-08 DIAGNOSIS — M6281 Muscle weakness (generalized): Secondary | ICD-10-CM | POA: Diagnosis not present

## 2018-01-08 DIAGNOSIS — R278 Other lack of coordination: Secondary | ICD-10-CM

## 2018-01-08 NOTE — Therapy (Signed)
[facility] [employer] MAIN [facility] SERVICES [address] [place], [place], [address] Phone: [phone]   Fax:  [phone]  Occupational Therapy Progress Note  Dates of reporting period 12/08/2017  to  01/08/2018  Patient Details  Name: Jason Nolan MRN: [id number] Date of Birth: 1945-11-02 Referring Provider: [name]   Encounter Date: 01/08/2018  OT End of Session - 01/08/18 1000    Visit Number  69    Number of Visits  72    Date for OT Re-Evaluation  02/26/18    Authorization Type  Medicare 10 of 10, reporting period starting 12/08/2017    OT Start Time  0932    OT Stop Time  1015    OT Time Calculation (min)  43 min    Activity Tolerance  Patient tolerated treatment well    Behavior During Therapy  [facility] for tasks assessed/performed       Past Medical History:  Diagnosis Date  . Glaucoma   . Hepatitis C   . Stroke [facility]     Past Surgical History:  Procedure Laterality Date  . IR ANGIO INTRA EXTRACRAN SEL COM CAROTID INNOMINATE UNI L MOD SED  11/25/2016  . IR ANGIO VERTEBRAL SEL SUBCLAVIAN INNOMINATE UNI R MOD SED  11/25/2016  . IR ANGIO VERTEBRAL SEL VERTEBRAL UNI L MOD SED  11/25/2016  . IR INTRAVSC STENT CERV CAROTID W/O EMB-PROT MOD SED INC ANGIO  11/25/2016  . IR PERCUTANEOUS ART THROMBECTOMY/INFUSION INTRACRANIAL INC DIAG ANGIO  11/25/2016  . IR RADIOLOGIST EVAL & MGMT  02/13/2017  . RADIOLOGY WITH ANESTHESIA N/A 11/25/2016   Procedure: RADIOLOGY WITH ANESTHESIA;  Surgeon: Radiologist, Medication, MD;  Location: [facility] OR;  Service: Radiology;  Laterality: N/A;    There were no vitals filed for this visit.   OT TREATMENT    Neuro muscular re-education:   Pt. worked on translatory movements of the left hand, worked on grasping and moving th 1/2" circular marbles in his hand with emphasis placed on using his thumb to move the objects from his palm to the tip of his 2nd digit, and thumb. Pt. worked on grasping, and positioning magnetic  hooks on a whiteboard positioned at a vertical angle on the tabletop. Pt. Worked on [facility] skills grasping 1/2" flat washers, and placing them on the hooks. Pt. worked on moving the washers through his hand.                               OT Long Term Goals - 01/08/18 1007      OT LONG TERM GOAL #3   Title  Pt. left hand grip with  increase by 5# in preparation for opening a jar.    Baseline  01/08/2018: improving    Time  12    Period  Weeks    Target Date  02/26/18      OT LONG TERM GOAL #6   Title  Pt. will improve left hand coordination skills to be able to button clothing.    Baseline  01/08/2018: Pt. has improved with left hand coordination skills, continues to have difficulty with buttoning.    Time  12    Period  Weeks    Status  On-going    Target Date  02/26/18      OT LONG TERM GOAL #8   Title  Pt. will increase Left pinch strength to be able to grasp and hold items  during ADL tasks.    Baseline  01/08/2018: Pt. continues to progress with  pinch strength, and ability to grasp, howeevr has difficulty holding objects.    Time  12    Period  Weeks    Status  On-going    Target Date  02/26/18      OT LONG TERM GOAL  #9   Baseline  Pt. will write one sentence with 75% legibility and efficiently while maintaing grasp on pen 100% of the time.    Time  12    Period  Weeks    Status  On-going    Target Date  02/26/18      OT LONG TERM GOAL  #10   TITLE  Pt. will independently use his left hand to grasp and efficiently accomodate to the varying weights of ADL objects during self care tasks.     Baseline  01/08/2018: Pt. continues to work on grasping and adjusting hand to the varying weights of objects.    Time  12    Period  Weeks    Status  On-going    Target Date  02/26/18      OT LONG TERM GOAL  #11   Baseline  01/08/2018: pt. has difficulty grasping, and manipulating objects when UEs elevated     Time  12    Period  Weeks    Status  On-going     Target Date  02/26/18            Plan - 01/08/18 0950    Clinical Impression Statement  Pt. continues to make steady progress with his left UE strength, and motor control, and [facility] skills. Pt. continues to work on improving left hand [facility] Pt. continues to work on improving thumb opposition, and translatory movements of the hand. Goals were reviewed.    Occupational performance deficits (Please refer to evaluation for details):  ADL's;IADL's    Rehab Potential  Excellent    OT Frequency  2x / week    OT Duration  12 weeks    OT Treatment/Interventions  Self-care/ADL training;Therapeutic exercise;Neuromuscular education;Patient/family education;Energy conservation;Therapeutic activities;DME and/or AE instruction    Clinical Decision Making  Several treatment options, min-mod task modification necessary    Consulted and Agree with Plan of Care  Patient       Patient will benefit from skilled therapeutic intervention in order to improve the following deficits and impairments:  Abnormal gait, Pain, Decreased strength, Decreased range of motion, Impaired UE functional use, Decreased coordination, Decreased knowledge of use of DME, Decreased activity tolerance, Difficulty walking, Decreased balance, Other (comment)  Visit Diagnosis: Muscle weakness (generalized)  Other lack of coordination    Problem List Patient Active Problem List   Diagnosis Date Noted  . 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 Sore throat 09/19/2017  . Low vitamin B12 level 06/27/2017  . 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 Pure hypercholesterolemia 05/28/2017  . Erectile dysfunction 05/28/2017  . Left hemiparesis (HCC)   . Dysphagia 11/29/2016  . Essential hypertension 11/29/2016  . 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 Leukopenia 03/31/2016  . Hepatitis C 07/22/2013    Olegario Messier, MS, OTR/L 01/08/2018, 10:12 AM  [facility] MAIN [facility] SERVICES [address] [place], [place], [address] Phone: [phone]   Fax:  [phone]  Name: Jason Nolan  MRN: [id number] Date of Birth: 1945/06/09

## 2018-01-12 ENCOUNTER — Ambulatory Visit: Payer: Medicare Other | Admitting: Occupational Therapy

## 2018-01-12 ENCOUNTER — Encounter: Payer: Self-pay | Admitting: Occupational Therapy

## 2018-01-12 DIAGNOSIS — R278 Other lack of coordination: Secondary | ICD-10-CM

## 2018-01-12 DIAGNOSIS — M6281 Muscle weakness (generalized): Secondary | ICD-10-CM

## 2018-01-12 NOTE — Therapy (Signed)
[place] [facility] MAIN [facility] SERVICES [address] [place], [place], [address] Phone: [phone]   Fax:  [phone]  Occupational Therapy Treatment  Patient Details  Name: Jason Nolan MRN: [id number] Date of Birth: 07/28/1945 Referring Provider: [name]   Encounter Date: 01/12/2018  OT End of Session - 01/12/18 0941    Visit Number  70    Number of Visits  72    Date for OT Re-Evaluation  02/26/18    Authorization Type  Medicare 1 of 10, reporting period starting 01/12/2018    OT Start Time  0930    OT Stop Time  1015    OT Time Calculation (min)  45 min    Activity Tolerance  Patient tolerated treatment well    Behavior During Therapy  [facility] for tasks assessed/performed       Past Medical History:  Diagnosis Date  . Glaucoma   . Hepatitis C   . Stroke [facility]     Past Surgical History:  Procedure Laterality Date  . IR ANGIO INTRA EXTRACRAN SEL COM CAROTID INNOMINATE UNI L MOD SED  11/25/2016  . IR ANGIO VERTEBRAL SEL SUBCLAVIAN INNOMINATE UNI R MOD SED  11/25/2016  . IR ANGIO VERTEBRAL SEL VERTEBRAL UNI L MOD SED  11/25/2016  . IR INTRAVSC STENT CERV CAROTID W/O EMB-PROT MOD SED INC ANGIO  11/25/2016  . IR PERCUTANEOUS ART THROMBECTOMY/INFUSION INTRACRANIAL INC DIAG ANGIO  11/25/2016  . IR RADIOLOGIST EVAL & MGMT  02/13/2017  . RADIOLOGY WITH ANESTHESIA N/A 11/25/2016   Procedure: RADIOLOGY WITH ANESTHESIA;  Surgeon: Radiologist, Medication, MD;  Location: [facility] OR;  Service: Radiology;  Laterality: N/A;    There were no vitals filed for this visit.  Subjective Assessment - 01/12/18 0938    Subjective   Pt. reports that his is doing well.    Patient is accompained by:  Family member    Pertinent History  Pt. is a 72 y.o. 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 Pt. was transferred to [facility]. He underwent CTA brain showed short segment near occlusion of proximal R-ICA with associated intraluminal thrombus --likely due to  dissection and emergent large vessel occlusion with right M2 occlusion and evolving right frontal lobe infarct.  He underwent cerebral angio with  complete revascularization of occluded superior division of R-MCA and near complete revascularization of proximal R-ICA with stent assisted angioplasty.  Stroke felt to be embolic in setting of ruptured plaque R-ICA and he was placed on ASA and brillinta due to stent. Pt. was transferred to inpatient rehab for several weeks, had home health therapy upon discharge, and now is ready for outpatient therapy services.    Limitations  Dominant LUE functioning, left hand edema, distal ROM, coordination, and overal strength.    Patient Stated Goals  To regain the use of his LUE.    Currently in Pain?  No/denies    Multiple Pain Sites  No       OT TREATMENT    Neuro muscular re-education:   Pt. worked on translatory movements of the left hand, worked on grasping and moving th 1/2" circular marbles in his hand with emphasis placed on using his thumb to move the objects from his palm to the tip of his 2nd digit, and thumb. Pt. worked on grasping 1/2" flat washers and reaching up to place them on vertical dowels placed at various angles. Pt. worked on [facility] skills grasping 1/2" flat washers, and placing them on  the hooks. Pt. worked on moving the washers through his hand from his palm to the tip of his 2nd digit. Pt. worked on holding the objects in the ulnar aspect of his hand while using his 2nd digit, and thumb. 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 Pt. is improving with writing legibility, however spacing deviated from the line as the sentence length progressed.                            OT Education - 01/12/18 0940    Education provided  Yes    Education Details  HEP    Person(s) Educated  Patient    Methods  Explanation    Comprehension  Verbalized understanding;Returned demonstration          OT Long Term Goals - 01/08/18 1007       OT LONG TERM GOAL #3   Title  Pt. left hand grip with  increase by 5# in preparation for opening a jar.    Baseline  01/08/2018: improving    Time  12    Period  Weeks    Target Date  02/26/18      OT LONG TERM GOAL #6   Title  Pt. will improve left hand coordination skills to be able to button clothing.    Baseline  01/08/2018: Pt. has improved with left hand coordination skills, continues to have difficulty with buttoning.    Time  12    Period  Weeks    Status  On-going    Target Date  02/26/18      OT LONG TERM GOAL #8   Title  Pt. will increase Left pinch strength to be able to grasp and hold items during ADL tasks.    Baseline  01/08/2018: Pt. continues to progress with  pinch strength, and ability to grasp, howeevr has difficulty holding objects.    Time  12    Period  Weeks    Status  On-going    Target Date  02/26/18      OT LONG TERM GOAL  #9   Baseline  Pt. will write one sentence with 75% legibility and efficiently while maintaing grasp on pen 100% of the time.    Time  12    Period  Weeks    Status  On-going    Target Date  02/26/18      OT LONG TERM GOAL  #10   TITLE  Pt. will independently use his left hand to grasp and efficiently accomodate to the varying weights of ADL objects during self care tasks.     Baseline  01/08/2018: Pt. continues to work on grasping and adjusting hand to the varying weights of objects.    Time  12    Period  Weeks    Status  On-going    Target Date  02/26/18      OT LONG TERM GOAL  #11   Baseline  01/08/2018: pt. has difficulty grasping, and manipulating objects when UEs elevated     Time  12    Period  Weeks    Status  On-going    Target Date  02/26/18            Plan - 01/12/18 0942    Clinical Impression Statement  Pt. tried to use his left hand over the weekend to paint a door handle, and had to switch hands to gain more control.  Pt. reports that he  was unable to get out to play golf this past weekend. Pt. reports  that he tries to go out during the week to play golf. Pt. is making progress  overall with his laft hand function. Pt. is improving with moving objects through his hand using his thumb, with improved motor control and thumb control. Writing legibility is improving with his left. Pt. continues to require increased time to complete. Pt. presented with deviation from the line near the end of each line.     Occupational performance deficits (Please refer to evaluation for details):  ADL's;IADL's    Rehab Potential  Excellent    OT Frequency  2x / week    OT Duration  12 weeks    OT Treatment/Interventions  Self-care/ADL training;Therapeutic exercise;Neuromuscular education;Patient/family education;Energy conservation;Therapeutic activities;DME and/or AE instruction    Clinical Decision Making  Several treatment options, min-mod task modification necessary    Consulted and Agree with Plan of Care  Patient       Patient will benefit from skilled therapeutic intervention in order to improve the following deficits and impairments:  Abnormal gait, Pain, Decreased strength, Decreased range of motion, Impaired UE functional use, Decreased coordination, Decreased knowledge of use of DME, Decreased activity tolerance, Difficulty walking, Decreased balance, Other (comment)  Visit Diagnosis: Muscle weakness (generalized)  Other lack of coordination    Problem List Patient Active Problem List   Diagnosis Date Noted  . 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 Sore throat 09/19/2017  . Low vitamin B12 level 06/27/2017  . 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 Pure hypercholesterolemia 05/28/2017  . Erectile dysfunction 05/28/2017  . Left hemiparesis (HCC)   . Dysphagia 11/29/2016  . Essential hypertension 11/29/2016  . 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 Leukopenia  03/31/2016  . Hepatitis C 07/22/2013    Olegario Messier, MS, OTR/L 01/12/2018, 5:46 PM  [place] [facility] MAIN [facility] SERVICES [address] [place], [place], [address] Phone: [phone]   Fax:  [phone]  Name: Jason Nolan MRN: [id number] Date of Birth: August 12, 1945

## 2018-01-13 ENCOUNTER — Ambulatory Visit: Payer: Self-pay | Admitting: *Deleted

## 2018-01-13 NOTE — Telephone Encounter (Signed)
Pt reports left sided abdominal pain, level with umbilicus. Gradual onset 3-4 days ago. States 4/10, intermittent. At times worse after eating. Also reports lower back pain, across lower back, right side "A little worse." Denies any dysuria, afebrile. Denies nausea, no vomiting or diarrhea. LBM today, WNL. Also reports "metallic taste in mouth" at times. Agent secured appt for Thursday with R. Baity prior to triage. NT attempted to secure appt within 24 hours per protocol, pt stated he would rather wait until Thursday. Care advise given per protocol. Instructed to call back if symptoms worsen.  Reason for Disposition . [1] MODERATE pain (e.g., interferes with normal activities) AND [2] pain comes and goes (cramps) AND [3] present > 24 hours  (Exception: pain with Vomiting or Diarrhea - see that Guideline)  Answer Assessment - Initial Assessment Questions 1. LOCATION: "Where does it hurt?"     Left side across from umbilicus 2. RADIATION: "Does the pain shoot anywhere else?" (e.g., chest, back)     Across lower back pain, > right side 3. ONSET: "When did the pain begin?" (Minutes, hours or days ago)      3-4 days ago 4. SUDDEN: "Gradual or sudden onset?"     gradually 5. PATTERN "Does the pain come and go, or is it constant?"    - If constant: "Is it getting better, staying the same, or worsening?"      (Note: Constant means the pain never goes away completely; most serious pain is constant and it progresses)     - If intermittent: "How long does it last?" "Do you have pain now?"     (Note: Intermittent means the pain goes away completely between bouts)     Intermittent; worse after eating at times 6. SEVERITY: "How bad is the pain?"  (e.g., Scale 1-10; mild, moderate, or severe)    - MILD (1-3): doesn't interfere with normal activities, abdomen soft and not tender to touch     - MODERATE (4-7): interferes with normal activities or awakens from sleep, tender to touch     - SEVERE (8-10):  excruciating pain, doubled over, unable to do any normal activities       4/10 7. RECURRENT SYMPTOM: "Have you ever had this type of abdominal pain before?" If so, ask: "When was the last time?" and "What happened that time?"      no 8. CAUSE: "What do you think is causing the abdominal pain?"     unsure 9. RELIEVING/AGGRAVATING FACTORS: "What makes it better or worse?" (e.g., movement, antacids, bowel movement)     no 10. OTHER SYMPTOMS: "Has there been any vomiting, diarrhea, constipation, or urine problems?"     no  Protocols used: ABDOMINAL PAIN - MALE-A-AH

## 2018-01-14 ENCOUNTER — Ambulatory Visit: Payer: Medicare Other | Admitting: Occupational Therapy

## 2018-01-14 ENCOUNTER — Encounter: Payer: Self-pay | Admitting: Occupational Therapy

## 2018-01-14 DIAGNOSIS — R278 Other lack of coordination: Secondary | ICD-10-CM | POA: Diagnosis not present

## 2018-01-14 DIAGNOSIS — M6281 Muscle weakness (generalized): Secondary | ICD-10-CM | POA: Diagnosis not present

## 2018-01-14 NOTE — Therapy (Signed)
[facility] MAIN [facility] SERVICES [address] [place], [place], [address] Phone: [phone]   Fax:  [phone]  Occupational Therapy Treatment  Patient Details  Name: Jason Nolan MRN: [id number] Date of Birth: [birth year] Referring Provider: [name]   Encounter Date: 01/14/2018  OT End of Session - 01/14/18 0841    Visit Number  71    Number of Visits  72    Date for OT Re-Evaluation  02/26/18    Authorization Type  Medicare 2 of 10, reporting period starting 01/12/2018    OT Start Time  0830    OT Stop Time  0915    OT Time Calculation (min)  45 min    Activity Tolerance  Patient tolerated treatment well    Behavior During Therapy  [facility] for tasks assessed/performed       Past Medical History:  Diagnosis Date  . Glaucoma   . Hepatitis C   . Stroke [facility])     Past Surgical History:  Procedure Laterality Date  . IR ANGIO INTRA EXTRACRAN SEL COM CAROTID INNOMINATE UNI L MOD SED  11/25/2016  . IR ANGIO VERTEBRAL SEL SUBCLAVIAN INNOMINATE UNI R MOD SED  11/25/2016  . IR ANGIO VERTEBRAL SEL VERTEBRAL UNI L MOD SED  11/25/2016  . IR INTRAVSC STENT CERV CAROTID W/O EMB-PROT MOD SED INC ANGIO  11/25/2016  . IR PERCUTANEOUS ART THROMBECTOMY/INFUSION INTRACRANIAL INC DIAG ANGIO  11/25/2016  . IR RADIOLOGIST EVAL & MGMT  02/13/2017  . RADIOLOGY WITH ANESTHESIA N/A 11/25/2016   Procedure: RADIOLOGY WITH ANESTHESIA;  Surgeon: Radiologist, Medication, MD;  Location: [facility] OR;  Service: Radiology;  Laterality: N/A;    There were no vitals filed for this visit.  Subjective Assessment - 01/14/18 0838    Subjective   Pt. reports he plans to play golf today.    Patient is accompained by:  Family member    Pertinent History  Pt. is a 72 y.o. 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 Pt. was transferred to [facility]. He underwent CTA brain showed short segment near occlusion of proximal R-ICA with associated intraluminal thrombus --likely  due to dissection and emergent large vessel occlusion with right M2 occlusion and evolving right frontal lobe infarct.  He underwent cerebral angio with  complete revascularization of occluded superior division of R-MCA and near complete revascularization of proximal R-ICA with stent assisted angioplasty.  Stroke felt to be embolic in setting of ruptured plaque R-ICA and he was placed on ASA and brillinta due to stent. Pt. was transferred to inpatient rehab for several weeks, had home health therapy upon discharge, and now is ready for outpatient therapy services.    Limitations  Dominant LUE functioning, left hand edema, distal ROM, coordination, and overal strength.    Patient Stated Goals  To regain the use of his LUE.    Currently in Pain?  No/denies      OT TREATMENT    Neuro muscular re-education:  Pt. worked on left hand [facility] skills, and hand function skills. Pt. worked on translatory movements of the hand, and coordinating thumb movement patterns, and opposition. Pt. worked on storing 1/2: washers in the palm of his hand, and moving them from his palm to the tip of his 2nd digit, and thumb.  Pt. worked on reaching up to place the washers on hooks placed on a vertical angle. Pt. worked removing the washers grasping the washers using his thumb, and 2nd digit. Pt. required  the position on the hook position to be modified. Pt. performed [facility] skills training to improve speed and dexterity needed for ADL tasks and writing. Pt. demonstrated grasping " sticks from a standard dish. Pt. required cues to avoid compensation proximally, and lateral leaning to the left with the trunk.  Pt. required the 1" sticks, and 1/4"  collars to be stabilized in a magnetic dish when grasping.                           OT Education - 01/14/18 0840    Education provided  Yes    Education Details  HEP    Person(s) Educated  Patient    Methods  Explanation    Comprehension  Verbalized  understanding;Returned demonstration          OT Long Term Goals - 01/08/18 1007      OT LONG TERM GOAL #3   Title  Pt. left hand grip with  increase by 5# in preparation for opening a jar.    Baseline  01/08/2018: improving    Time  12    Period  Weeks    Target Date  02/26/18      OT LONG TERM GOAL #6   Title  Pt. will improve left hand coordination skills to be able to button clothing.    Baseline  01/08/2018: Pt. has improved with left hand coordination skills, continues to have difficulty with buttoning.    Time  12    Period  Weeks    Status  On-going    Target Date  02/26/18      OT LONG TERM GOAL #8   Title  Pt. will increase Left pinch strength to be able to grasp and hold items during ADL tasks.    Baseline  01/08/2018: Pt. continues to progress with  pinch strength, and ability to grasp, howeevr has difficulty holding objects.    Time  12    Period  Weeks    Status  On-going    Target Date  02/26/18      OT LONG TERM GOAL  #9   Baseline  Pt. will write one sentence with 75% legibility and efficiently while maintaing grasp on pen 100% of the time.    Time  12    Period  Weeks    Status  On-going    Target Date  02/26/18      OT LONG TERM GOAL  #10   TITLE  Pt. will independently use his left hand to grasp and efficiently accomodate to the varying weights of ADL objects during self care tasks.     Baseline  01/08/2018: Pt. continues to work on grasping and adjusting hand to the varying weights of objects.    Time  12    Period  Weeks    Status  On-going    Target Date  02/26/18      OT LONG TERM GOAL  #11   Baseline  01/08/2018: pt. has difficulty grasping, and manipulating objects when UEs elevated     Time  12    Period  Weeks    Status  On-going    Target Date  02/26/18            Plan - 01/14/18 0844    Clinical Impression Statement  Pt. reports he plans to go play golf after therapy today with his neighbors. Pt. has improved with overall motor  control, and left  hand coordination skills. Pt. continues to work on improving translatory movements of the hand with emphasis on thumb movement patterns, and thumb opposition. Pt. Continues to require work on translatory movements of the hand, and grasp patterns when manipulating small objects.   Occupational performance deficits (Please refer to evaluation for details):  ADL's;IADL's    Rehab Potential  Excellent    OT Frequency  2x / week    OT Duration  12 weeks    OT Treatment/Interventions  Self-care/ADL training;Therapeutic exercise;Neuromuscular education;Patient/family education;Energy conservation;Therapeutic activities;DME and/or AE instruction    Clinical Decision Making  Several treatment options, min-mod task modification necessary    Consulted and Agree with Plan of Care  Patient       Patient will benefit from skilled therapeutic intervention in order to improve the following deficits and impairments:  Abnormal gait, Pain, Decreased strength, Decreased range of motion, Impaired UE functional use, Decreased coordination, Decreased knowledge of use of DME, Decreased activity tolerance, Difficulty walking, Decreased balance, Other (comment)  Visit Diagnosis: Muscle weakness (generalized)  Other lack of coordination    Problem List Patient Active Problem List   Diagnosis Date Noted  . 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 Sore throat 09/19/2017  . Low vitamin B12 level 06/27/2017  . 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 Pure hypercholesterolemia 05/28/2017  . Erectile dysfunction 05/28/2017  . Left hemiparesis (HCC)   . Dysphagia 11/29/2016  . Essential hypertension 11/29/2016  . 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 Leukopenia 03/31/2016  . Hepatitis C 07/22/2013    Olegario Messier, MS, OTR/L 01/14/2018, 8:58 AM  [facility] MAIN [facility] SERVICES [address] [place], [place], [address] Phone: [phone]   Fax:  [phone]  Name: Hayward Rylander MRN: [id number] Date of Birth: Dec 30, 1945

## 2018-01-15 ENCOUNTER — Ambulatory Visit (INDEPENDENT_AMBULATORY_CARE_PROVIDER_SITE_OTHER)
Admission: RE | Admit: 2018-01-15 | Discharge: 2018-01-15 | Disposition: A | Discharge status: Home / Self Care | Payer: Medicare Other | Source: Ambulatory Visit | Attending: Internal Medicine | Admitting: Internal Medicine

## 2018-01-15 ENCOUNTER — Encounter: Payer: Self-pay | Admitting: Internal Medicine

## 2018-01-15 ENCOUNTER — Ambulatory Visit (INDEPENDENT_AMBULATORY_CARE_PROVIDER_SITE_OTHER): Payer: Medicare Other | Admitting: Internal Medicine

## 2018-01-15 VITALS — BP 128/70 | HR 76 | Temp 98.2°F | Wt 191.0 lb

## 2018-01-15 DIAGNOSIS — R1032 Left lower quadrant pain: Secondary | ICD-10-CM | POA: Diagnosis not present

## 2018-01-15 DIAGNOSIS — K5900 Constipation, unspecified: Secondary | ICD-10-CM

## 2018-01-15 DIAGNOSIS — I639 Cerebral infarction, unspecified: Secondary | ICD-10-CM | POA: Diagnosis not present

## 2018-01-15 DIAGNOSIS — R82998 Other abnormal findings in urine: Secondary | ICD-10-CM | POA: Diagnosis not present

## 2018-01-15 DIAGNOSIS — M545 Low back pain, unspecified: Secondary | ICD-10-CM

## 2018-01-15 DIAGNOSIS — R768 Other specified abnormal immunological findings in serum: Secondary | ICD-10-CM | POA: Diagnosis not present

## 2018-01-15 LAB — POC URINALSYSI DIPSTICK (AUTOMATED)
BILIRUBIN UA: NEGATIVE
Glucose, UA: NEGATIVE
Ketones, UA: NEGATIVE
Leukocytes, UA: NEGATIVE
NITRITE UA: NEGATIVE
PH UA: 6 (ref 5.0–8.0)
Protein, UA: NEGATIVE
SPEC GRAV UA: 1.02 (ref 1.010–1.025)
Urobilinogen, UA: 2 E.U./dL — AB

## 2018-01-15 MED ORDER — NAPROXEN 500 MG PO TABS: 500.0000 mg | ORAL_TABLET | Freq: Two times a day (BID) | ORAL | 0 refills | Status: DC

## 2018-01-15 MED ORDER — ORPHENADRINE CITRATE ER 100 MG PO TB12: 100.0000 mg | ORAL_TABLET | Freq: Two times a day (BID) | ORAL | 0 refills | Status: DC

## 2018-01-15 NOTE — Addendum Note (Signed)
Addended by: Roena MaladyEVONTENNO, Donnivan Villena Y on: 01/15/2018 12:16 PM   Modules accepted: Orders

## 2018-01-15 NOTE — Patient Instructions (Signed)

## 2018-01-15 NOTE — Progress Notes (Signed)
Subjective:    Patient ID: Jason Nolan, male    DOB: [birth year], 72 y.o.   MRN: [id number]  HPI  Pt presents to the clinic today with c/o left sided abdominal pain. He reorts this started 4-5 days ago. The pain comes and goes. It seems worse after eating. He denies nausea, vomiting, diarrhea. He has been a little constipated. He denies blood in his stool. He denies recent changes in diet or medications.  His last colonoscopy from 2010 was normal.   He also reports low back pain. This started 2-3 weeks ago. He describes the pain as sore and achy, L>R. He denies urinary issues but reports his urine is dark at times. He denies any injury to the area. He has not taken any medication for this.  He also reports that he had a positive Hep B antibody test done by hematology. He is concerned about this and was advised that this is a chronic issue for which there is no cure. He would like further explanation of this today.  Review of Systems  Past Medical History:  Diagnosis Date  . Glaucoma   . Hepatitis C   . Stroke [facility])     Current Outpatient Medications  Medication Sig Dispense Refill  . amLODipine (NORVASC) 10 MG tablet TAKE 1 TABLET DAILY 90 tablet 2  . aspirin 81 MG chewable tablet Chew 1 tablet (81 mg total) by mouth daily.    Marland Kitchen atorvastatin (LIPITOR) 40 MG tablet TAKE 1 TABLET DAILY AT 6 P.M. 90 tablet 1  . dorzolamide-timolol (COSOPT) 22.3-6.8 MG/ML ophthalmic solution     . PAZEO 0.7 % SOLN Place 1 drop into both eyes daily.     . tadalafil (CIALIS) 20 MG tablet TAKE 0.5-1 TABLETS (10-20 MG TOTAL) BY MOUTH EVERY OTHER DAY AS NEEDED FOR ERECTILE DYSFUNCTION.  2  . VYZULTA 0.024 % SOLN Apply 1 drop to eye at bedtime.      No current facility-administered medications for this visit.     No Known Allergies  Family History  Problem Relation Age of Onset  . Hypertension Mother   . Hypertension Father     Social History   Socioeconomic History  . Marital status: Married      Spouse name: Not on file  . Number of children: Not on file  . Years of education: Not on file  . Highest education level: Not on file  Occupational History  . Not on file  Social Needs  . Financial resource strain: Not on file  . Food insecurity:    Worry: Not on file    Inability: Not on file  . Transportation needs:    Medical: Not on file    Non-medical: Not on file  Tobacco Use  . Smoking status: Former Smoker    Packs/day: 0.50    Years: 15.00    Pack years: 7.50    Last attempt to quit: 1990    Years since quitting: 29.7  . Smokeless tobacco: Never Used  Substance and Sexual Activity  . Alcohol use: No    Alcohol/week: 0.0 standard drinks  . Drug use: No  . Sexual activity: Yes    Partners: Female    Birth control/protection: None  Lifestyle  . Physical activity:    Days per week: Not on file    Minutes per session: Not on file  . Stress: Not on file  Relationships  . Social connections:    Talks on phone: Not on file  Gets together: Not on file    Attends religious service: Not on file    Active member of club or organization: Not on file    Attends meetings of clubs or organizations: Not on file    Relationship status: Not on file  . Intimate partner violence:    Fear of current or ex partner: Not on file    Emotionally abused: Not on file    Physically abused: Not on file    Forced sexual activity: Not on file  Other Topics Concern  . Not on file  Social History Narrative  . Not on file     Constitutional: Denies fever, malaise, fatigue, headache or abrupt weight changes.  Respiratory: Denies difficulty breathing, shortness of breath, cough or sputum production.   Cardiovascular: Denies chest pain, chest tightness, palpitations or swelling in the hands or feet.  Gastrointestinal: Pt reports abdominal pain, constipation. Denies bloating, diarrhea or blood in the stool.  GU: Pt reports dark urine. Denies urgency, frequency, pain with urination,  burning sensation, blood in urine, odor or discharge. Musculoskeletal: Pt reports low back pain. Denies decrease in range of motion, difficulty with gait, or joint swelling.    No other specific complaints in a complete review of systems (except as listed in HPI above).     Objective:   Physical Exam   BP 128/70   Pulse 76   Temp 98.2 F (36.8 C) (Oral)   Wt 191 lb (86.6 kg)   SpO2 98%   BMI 27.41 kg/m  Wt Readings from Last 3 Encounters:  01/15/18 191 lb (86.6 kg)  12/11/17 195 lb 6.4 oz (88.6 kg)  11/03/17 198 lb 4 oz (89.9 kg)    General: Appears his stated age, well developed, well nourished in NAD. Abdomen: Soft and nontender. Normal bowel sounds. No distention or masses noted. No CVA tenderness noted. Musculoskeletal: Normal flexion, extension and rotation of the spine. No bony tenderness noted over the spine of SI joints. Able to walk on heels and toes. No difficulty with gait.  Neurological: Alert and oriented.    BMET    Component Value Date/Time   NA 140 05/28/2017 1506   K 4.0 05/28/2017 1506   CL 104 05/28/2017 1506   CO2 25 05/28/2017 1506   GLUCOSE 81 05/28/2017 1506   BUN 14 05/28/2017 1506   CREATININE 0.91 05/28/2017 1506   CALCIUM 9.4 05/28/2017 1506   GFRNONAA >60 12/19/2016 0406   GFRAA >60 12/19/2016 0406    Lipid Panel     Component Value Date/Time   CHOL 125 05/28/2017 1506   TRIG 41.0 05/28/2017 1506   HDL 68.00 05/28/2017 1506   CHOLHDL 2 05/28/2017 1506   VLDL 8.2 05/28/2017 1506   LDLCALC 49 05/28/2017 1506    CBC    Component Value Date/Time   WBC 3.0 (L) 06/20/2017 1605   RBC 4.84 06/20/2017 1605   HGB 14.8 06/20/2017 1605   HCT 44.6 06/20/2017 1605   PLT 202 06/20/2017 1605   MCV 92.1 06/20/2017 1605   MCH 30.7 06/20/2017 1605   MCHC 33.3 06/20/2017 1605   RDW 14.3 06/20/2017 1605   LYMPHSABS 1.1 06/20/2017 1605   MONOABS 0.2 06/20/2017 1605   EOSABS 0.1 06/20/2017 1605   BASOSABS 0.0 06/20/2017 1605    Hgb  A1C Lab Results  Component Value Date   HGBA1C 5.4 11/26/2016          Assessment & Plan:   Acute Bilateral Low Back Pain without  Sciatica:  Likely muscular Urinalysis: 1+ blood eRx for Naproxen 500 mg BID with food eRx for Norflex 100 mg BID - sedation caution given Back exercises given Heat may be helpful  LLQ Pain, Constipation:  KUB today Encouraged adequate water and fiber intake Start Mirilax daily prn  Hep B Antibody Positive:  Will check Hep B titer today  Will follow up after labs and xray, return precautions discussed Nicki Reaperegina Solina Heron, NP

## 2018-01-16 LAB — HEPATITIS B SURFACE ANTIBODY, QUANTITATIVE: Hepatitis B-Post: 75 m[IU]/mL (ref >=10)

## 2018-01-19 ENCOUNTER — Encounter: Payer: Self-pay | Admitting: Occupational Therapy

## 2018-01-19 ENCOUNTER — Ambulatory Visit: Payer: Medicare Other | Admitting: Occupational Therapy

## 2018-01-19 DIAGNOSIS — R278 Other lack of coordination: Secondary | ICD-10-CM | POA: Diagnosis not present

## 2018-01-19 DIAGNOSIS — M6281 Muscle weakness (generalized): Secondary | ICD-10-CM | POA: Diagnosis not present

## 2018-01-19 NOTE — Therapy (Signed)
[facility] [address] [place], [place], [address] Phone: [phone]   Fax:  [phone]  Occupational Therapy Treatment  Patient Details  Name: Jason CroakJames Nolan MRN: [id number] Date of Birth: January 14, 1946 Referring Provider: Lorre MunroeBAITY, REGINA W   Encounter Date: 01/19/2018  OT End of Session - 01/19/18 0940    Visit Number  72    Number of Visits  72    Date for OT Re-Evaluation  02/26/18    Authorization Type  Medicare 3 of 10, reporting period starting 01/12/2018    OT Start Time  0930    OT Stop Time  1015    OT Time Calculation (min)  45 min    Activity Tolerance  Patient tolerated treatment well    Behavior During Therapy  [facility] for tasks assessed/performed       Past Medical History:  Diagnosis Date  . Glaucoma   . Hepatitis C   . Stroke [facility](HCC)     Past Surgical History:  Procedure Laterality Date  . IR ANGIO INTRA EXTRACRAN SEL COM CAROTID INNOMINATE UNI L MOD SED  11/25/2016  . IR ANGIO VERTEBRAL SEL SUBCLAVIAN INNOMINATE UNI R MOD SED  11/25/2016  . IR ANGIO VERTEBRAL SEL VERTEBRAL UNI L MOD SED  11/25/2016  . IR INTRAVSC STENT CERV CAROTID W/O EMB-PROT MOD SED INC ANGIO  11/25/2016  . IR PERCUTANEOUS ART THROMBECTOMY/INFUSION INTRACRANIAL INC DIAG ANGIO  11/25/2016  . IR RADIOLOGIST EVAL & MGMT  02/13/2017  . RADIOLOGY WITH ANESTHESIA N/A 11/25/2016   Procedure: RADIOLOGY WITH ANESTHESIA;  Surgeon: Radiologist, Medication, MD;  Location: [facility] OR;  Service: Radiology;  Laterality: N/A;    There were no vitals filed for this visit.  Subjective Assessment - 01/19/18 0936    Subjective   Pt. reports no changes.    Patient is accompained by:  Family member    Pertinent History  Pt. is a 72 y.o. 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 [facility] He underwent CTA brain showed short segment near occlusion of proximal R-ICA with associated intraluminal thrombus --likely due to dissection  and emergent large vessel occlusion with right M2 occlusion and evolving right frontal lobe infarct.  He underwent cerebral angio with  complete revascularization of occluded superior division of R-MCA and near complete revascularization of proximal R-ICA with stent assisted angioplasty.  Stroke felt to be embolic in setting of ruptured plaque R-ICA and he was placed on ASA and brillinta due to stent. Pt. was transferred to inpatient rehab for several weeks, had home health therapy upon discharge, and now is ready for outpatient therapy services.    Limitations  Dominant LUE functioning, left hand edema, distal ROM, coordination, and overal strength.    Patient Stated Goals  To regain the use of his LUE.      OT TREATMENT    Neuro muscular re-education:  Pt. worked on [facility] skills manipulating small 1/2" objects,  Reaching up, placing them on precise targets onto vertical towers with his 2nd digit, and thumb. Pt. Worked on grasping the objects from a magnetic dish for more stabilization during grasping. Pt. orked on thumb opposition skills to the 2nd through 5th digits. Pt. Had difficulty grasping with the 4th, and 5th digits. Pt. Worked on translatory movements of the hand moving the objects through his palm.   Self-care:  Pt. The IADL of writing. Pt. Writing legibility, and speed were assessed during writing endurance. Writing speed  was 18 min. & 50 sec. To complete copying 5 sentences. Pt. Presented with deviation from the line as the sentences progressed on unlined paper. Pt. Was able to maintain the grasp with the large pen grip. Pt. Readjusted the pen 2 times towards the end of the task. Pt. Formulated printed text form with 2 scratch outs. Pt. Was able to write with more isolated distal digit movement rather than writing with the distal and proximal aspect of the LUE as one unit.                     OT Education - 01/19/18 0940    Education provided  Yes    Education Details   HEP    Person(s) Educated  Patient    Methods  Explanation    Comprehension  Verbalized understanding;Returned demonstration          OT Long Term Goals - 01/08/18 1007      OT LONG TERM GOAL #3   Title  Pt. left hand grip with  increase by 5# in preparation for opening a jar.    Baseline  01/08/2018: improving    Time  12    Period  Weeks    Target Date  02/26/18      OT LONG TERM GOAL #6   Title  Pt. will improve left hand coordination skills to be able to button clothing.    Baseline  01/08/2018: Pt. has improved with left hand coordination skills, continues to have difficulty with buttoning.    Time  12    Period  Weeks    Status  On-going    Target Date  02/26/18      OT LONG TERM GOAL #8   Title  Pt. will increase Left pinch strength to be able to grasp and hold items during ADL tasks.    Baseline  01/08/2018: Pt. continues to progress with  pinch strength, and ability to grasp, howeevr has difficulty holding objects.    Time  12    Period  Weeks    Status  On-going    Target Date  02/26/18      OT LONG TERM GOAL  #9   Baseline  Pt. will write one sentence with 75% legibility and efficiently while maintaing grasp on pen 100% of the time.    Time  12    Period  Weeks    Status  On-going    Target Date  02/26/18      OT LONG TERM GOAL  #10   TITLE  Pt. will independently use his left hand to grasp and efficiently accomodate to the varying weights of ADL objects during self care tasks.     Baseline  01/08/2018: Pt. continues to work on grasping and adjusting hand to the varying weights of objects.    Time  12    Period  Weeks    Status  On-going    Target Date  02/26/18      OT LONG TERM GOAL  #11   Baseline  01/08/2018: pt. has difficulty grasping, and manipulating objects when UEs elevated     Time  12    Period  Weeks    Status  On-going    Target Date  02/26/18            Plan - 01/19/18 0941    Clinical Impression Statement  Pt. continues to work on  improving left hand [facility] skills, translatory movements of the  hand, and thumb movement patterns moving objects through his hand  to his 2nd digit, and thumb to prepare for placing them at precises target.  Pt. focused on thumb opposition skills from the 2nd through 5th digits. Pt. had difficulty with thumb opposition to the 4th, and 5th digits. Pt. worked on writing spped, and legibility.     Occupational performance deficits (Please refer to evaluation for details):  ADL's;IADL's    Rehab Potential  Excellent    OT Frequency  2x / week    OT Treatment/Interventions  Self-care/ADL training;Therapeutic exercise;Neuromuscular education;Patient/family education;Energy conservation;Therapeutic activities;DME and/or AE instruction    Clinical Decision Making  Several treatment options, min-mod task modification necessary    Consulted and Agree with Plan of Care  Patient       Patient will benefit from skilled therapeutic intervention in order to improve the following deficits and impairments:  Abnormal gait, Pain, Decreased strength, Decreased range of motion, Impaired UE functional use, Decreased coordination, Decreased knowledge of use of DME, Decreased activity tolerance, Difficulty walking, Decreased balance, Other (comment)  Visit Diagnosis: Other lack of coordination    Problem List Patient Active Problem List   Diagnosis Date Noted  . Pure hypercholesterolemia 05/28/2017  . Erectile dysfunction 05/28/2017  . Left hemiparesis (HCC)   . Essential hypertension 11/29/2016  . 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 Hepatitis C 07/22/2013    Olegario Messier, MS, OTR/L 01/19/2018, 10:38 AM  [facility] MAIN [facility] SERVICES [address] [place], [place], [address] Phone: [phone]   Fax:  [phone]  Name: Jason Nolan MRN: [id number] Date  of Birth: Mar 02, 1946

## 2018-01-21 ENCOUNTER — Ambulatory Visit: Payer: Medicare Other | Admitting: Occupational Therapy

## 2018-01-21 ENCOUNTER — Encounter: Payer: Self-pay | Admitting: Occupational Therapy

## 2018-01-21 DIAGNOSIS — M6281 Muscle weakness (generalized): Secondary | ICD-10-CM | POA: Diagnosis not present

## 2018-01-21 DIAGNOSIS — R278 Other lack of coordination: Secondary | ICD-10-CM

## 2018-01-21 NOTE — Therapy (Signed)
[facility] MAIN [facility] SERVICES [address] [place], [place], [address] Phone: [phone]   Fax:  [phone]  Occupational Therapy Treatment  Patient Details  Name: Jason Nolan MRN: [id number] Date of Birth: 12-Mar-1946 Referring Provider: [name]   Encounter Date: 01/21/2018  OT End of Session - 01/21/18 0935    Visit Number  73    Number of Visits  96    Date for OT Re-Evaluation  02/26/18    Authorization Type  Medicare 4 of 10, reporting period starting 01/12/2018    OT Start Time  0930    OT Stop Time  1015    OT Time Calculation (min)  45 min    Activity Tolerance  Patient tolerated treatment well    Behavior During Therapy  [facility] for tasks assessed/performed       Past Medical History:  Diagnosis Date  . Glaucoma   . Hepatitis C   . Stroke [facility])     Past Surgical History:  Procedure Laterality Date  . IR ANGIO INTRA EXTRACRAN SEL COM CAROTID INNOMINATE UNI L MOD SED  11/25/2016  . IR ANGIO VERTEBRAL SEL SUBCLAVIAN INNOMINATE UNI R MOD SED  11/25/2016  . IR ANGIO VERTEBRAL SEL VERTEBRAL UNI L MOD SED  11/25/2016  . IR INTRAVSC STENT CERV CAROTID W/O EMB-PROT MOD SED INC ANGIO  11/25/2016  . IR PERCUTANEOUS ART THROMBECTOMY/INFUSION INTRACRANIAL INC DIAG ANGIO  11/25/2016  . IR RADIOLOGIST EVAL & MGMT  02/13/2017  . RADIOLOGY WITH ANESTHESIA N/A 11/25/2016   Procedure: RADIOLOGY WITH ANESTHESIA;  Surgeon: Radiologist, Medication, MD;  Location: [facility] OR;  Service: Radiology;  Laterality: N/A;    There were no vitals filed for this visit.  Subjective Assessment - 01/21/18 0934    Subjective   Pt. plans to go golfing tomorrow    Patient is accompained by:  Family member    Pertinent History  Pt. is a 72 y.o. 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 Pt. was transferred to [facility]. He underwent CTA brain showed short segment near occlusion of proximal R-ICA with associated intraluminal thrombus --likely due to  dissection and emergent large vessel occlusion with right M2 occlusion and evolving right frontal lobe infarct.  He underwent cerebral angio with  complete revascularization of occluded superior division of R-MCA and near complete revascularization of proximal R-ICA with stent assisted angioplasty.  Stroke felt to be embolic in setting of ruptured plaque R-ICA and he was placed on ASA and brillinta due to stent. Pt. was transferred to inpatient rehab for several weeks, had home health therapy upon discharge, and now is ready for outpatient therapy services.    Limitations  Dominant LUE functioning, left hand edema, distal ROM, coordination, and overal strength.    Patient Stated Goals  To regain the use of his LUE.    Currently in Pain?  No/denies      OT TREATMENT    Neuro muscular re-education:  Pt. worked on grasping coins from a tabletop surface, placing them into a resistive container, and pushing them through the slot while isolating his 2nd digit. Pt. worked on sliding the coins to the edge of the table, followed by grasping each coin off the table. Pt. dropped several coins initially when grasping and storing directly from the tabletop surface.  Emphasis was placed on translatory movements of the hand grasping and moving objects from the tip of the 2nd digit and thumb to the palm of his  hand for storage. Pt. orked on thumb opposition skills moving the objects from his palm to the tip of his 2nd digit, and thumb. Pt. had difficulty moving the coins distally beyond the 2nd DIP, requiring reposition of to coin before pressing it into the container. Pt. presented with compensation with the left elbow occasionally when talking, and as pt. fatigued. Cues were required to bring the elbow to his side. Pt. worked on prewriting exercises using the large tripod pen grasp formulating small circles with emphasis placed on connecting a seamless closure of the  circle                         OT Education - 01/21/18 0935    Education provided  Yes    Education Details  HEP    Person(s) Educated  Patient    Methods  Explanation    Comprehension  Verbalized understanding;Returned demonstration          OT Long Term Goals - 01/08/18 1007      OT LONG TERM GOAL #3   Title  Pt. left hand grip with  increase by 5# in preparation for opening a jar.    Baseline  01/08/2018: improving    Time  12    Period  Weeks    Target Date  02/26/18      OT LONG TERM GOAL #6   Title  Pt. will improve left hand coordination skills to be able to button clothing.    Baseline  01/08/2018: Pt. has improved with left hand coordination skills, continues to have difficulty with buttoning.    Time  12    Period  Weeks    Status  On-going    Target Date  02/26/18      OT LONG TERM GOAL #8   Title  Pt. will increase Left pinch strength to be able to grasp and hold items during ADL tasks.    Baseline  01/08/2018: Pt. continues to progress with  pinch strength, and ability to grasp, howeevr has difficulty holding objects.    Time  12    Period  Weeks    Status  On-going    Target Date  02/26/18      OT LONG TERM GOAL  #9   Baseline  Pt. will write one sentence with 75% legibility and efficiently while maintaing grasp on pen 100% of the time.    Time  12    Period  Weeks    Status  On-going    Target Date  02/26/18      OT LONG TERM GOAL  #10   TITLE  Pt. will independently use his left hand to grasp and efficiently accomodate to the varying weights of ADL objects during self care tasks.     Baseline  01/08/2018: Pt. continues to work on grasping and adjusting hand to the varying weights of objects.    Time  12    Period  Weeks    Status  On-going    Target Date  02/26/18      OT LONG TERM GOAL  #11   Baseline  01/08/2018: pt. has difficulty grasping, and manipulating objects when UEs elevated     Time  12    Period  Weeks    Status   On-going    Target Date  02/26/18            Plan - 01/21/18 0936    Clinical Impression  Statement Pt. continues to work on improving translatory movements of the hand grasping coins, storing them in the palm of his hand, and moving them from the palm of his hand to the tip of his 2nd digit, and thumb. Pt. needs conitnued work on moving the objects all the way to the tip of his 2nd digit, and thumb. pt. is able to move the coins to the 2nd DIP. Pt. Continues to require work on improving [facility] skills, translatory movements of the hand, and writing tasks.   Occupational performance deficits (Please refer to evaluation for details):  ADL's;IADL's    Rehab Potential  Excellent    OT Frequency  2x / week    OT Duration  12 weeks    OT Treatment/Interventions  Self-care/ADL training;Therapeutic exercise;Neuromuscular education;Patient/family education;Energy conservation;Therapeutic activities;DME and/or AE instruction    Clinical Decision Making  Several treatment options, min-mod task modification necessary    Consulted and Agree with Plan of Care  Patient       Patient will benefit from skilled therapeutic intervention in order to improve the following deficits and impairments:  Abnormal gait, Pain, Decreased strength, Decreased range of motion, Impaired UE functional use, Decreased coordination, Decreased knowledge of use of DME, Decreased activity tolerance, Difficulty walking, Decreased balance, Other (comment)  Visit Diagnosis: Muscle weakness (generalized)  Other lack of coordination    Problem List Patient Active Problem List   Diagnosis Date Noted  . Pure hypercholesterolemia 05/28/2017  . Erectile dysfunction 05/28/2017  . Left hemiparesis (HCC)   . Essential hypertension 11/29/2016  . 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 Hepatitis C 07/22/2013    Olegario MessierElaine  Eitan Doubleday, MS, OTR/L 01/21/2018, 9:57 AM  [facility] [address] [place], [place], [address] Phone: [phone]   Fax:  [phone]  Name: Jason CroakJames Nolan MRN: [id number] Date of Birth: 03/10/1946

## 2018-01-26 ENCOUNTER — Ambulatory Visit: Payer: Medicare Other | Admitting: Occupational Therapy

## 2018-01-26 ENCOUNTER — Encounter: Payer: Self-pay | Admitting: Occupational Therapy

## 2018-01-26 DIAGNOSIS — R278 Other lack of coordination: Secondary | ICD-10-CM

## 2018-01-26 DIAGNOSIS — M6281 Muscle weakness (generalized): Secondary | ICD-10-CM

## 2018-01-26 NOTE — Therapy (Signed)
[facility] MAIN [facility] SERVICES [address] [place], [place], [address] Phone: [phone]   Fax:  [phone]  Occupational Therapy Treatment  Patient Details  Name: Jason Nolan MRN: [id number] Date of Birth: [birth year] Referring Provider: [name]   Encounter Date: 01/26/2018  OT End of Session - 01/26/18 0948    Visit Number  74    Number of Visits  96    Date for OT Re-Evaluation  02/26/18    Authorization Type  Medicare 5 of 10, reporting period starting 01/12/2018    OT Start Time  0930    OT Stop Time  1015    OT Time Calculation (min)  45 min    Activity Tolerance  Patient tolerated treatment well    Behavior During Therapy  [facility] for tasks assessed/performed       Past Medical History:  Diagnosis Date  . Glaucoma   . Hepatitis C   . Stroke [facility])     Past Surgical History:  Procedure Laterality Date  . IR ANGIO INTRA EXTRACRAN SEL COM CAROTID INNOMINATE UNI L MOD SED  11/25/2016  . IR ANGIO VERTEBRAL SEL SUBCLAVIAN INNOMINATE UNI R MOD SED  11/25/2016  . IR ANGIO VERTEBRAL SEL VERTEBRAL UNI L MOD SED  11/25/2016  . IR INTRAVSC STENT CERV CAROTID W/O EMB-PROT MOD SED INC ANGIO  11/25/2016  . IR PERCUTANEOUS ART THROMBECTOMY/INFUSION INTRACRANIAL INC DIAG ANGIO  11/25/2016  . IR RADIOLOGIST EVAL & MGMT  02/13/2017  . RADIOLOGY WITH ANESTHESIA N/A 11/25/2016   Procedure: RADIOLOGY WITH ANESTHESIA;  Surgeon: Radiologist, Medication, MD;  Location: [facility] OR;  Service: Radiology;  Laterality: N/A;    There were no vitals filed for this visit.  Subjective Assessment - 01/26/18 0947    Subjective   Pt. plans to go golfing today.    Patient is accompained by:  Family member    Pertinent History  Pt. is a 72 y.o. 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 Pt. was transferred to [facility]. He underwent CTA brain showed short segment near occlusion of proximal R-ICA with associated intraluminal thrombus --likely due to  dissection and emergent large vessel occlusion with right M2 occlusion and evolving right frontal lobe infarct.  He underwent cerebral angio with  complete revascularization of occluded superior division of R-MCA and near complete revascularization of proximal R-ICA with stent assisted angioplasty.  Stroke felt to be embolic in setting of ruptured plaque R-ICA and he was placed on ASA and brillinta due to stent. Pt. was transferred to inpatient rehab for several weeks, had home health therapy upon discharge, and now is ready for outpatient therapy services.    Currently in Pain?  No/denies      OT TREATMENT    Neuro muscular re-education:  Pt. worked on grasping, and storing 1/2" smooth, circular marbles, storing them in the palm of his hand, and moving them through his hand to the tip of his 2nd digit, and thumb to prepare it for placing them into a container. Pt. dropped several of the circular marbles from his hand, and then transitioned to separating and moving the magnetic pegs with resistance in his hand, and moving the pegs to the tip of his 2nd digit, and thumb.  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 Pt. Presented with decreasing legibility as the writing task progressed today. Pt. required the paper to be repositioned several times.  OT Education - 01/26/18 0948    Education provided  Yes    Education Details  HEP    Person(s) Educated  Patient    Methods  Explanation    Comprehension  Verbalized understanding;Returned demonstration          OT Long Term Goals - 01/08/18 1007      OT LONG TERM GOAL #3   Title  Pt. left hand grip with  increase by 5# in preparation for opening a jar.    Baseline  01/08/2018: improving    Time  12    Period  Weeks    Target Date  02/26/18      OT LONG TERM GOAL #6   Title  Pt. will improve left hand coordination skills to be able to button  clothing.    Baseline  01/08/2018: Pt. has improved with left hand coordination skills, continues to have difficulty with buttoning.    Time  12    Period  Weeks    Status  On-going    Target Date  02/26/18      OT LONG TERM GOAL #8   Title  Pt. will increase Left pinch strength to be able to grasp and hold items during ADL tasks.    Baseline  01/08/2018: Pt. continues to progress with  pinch strength, and ability to grasp, howeevr has difficulty holding objects.    Time  12    Period  Weeks    Status  On-going    Target Date  02/26/18      OT LONG TERM GOAL  #9   Baseline  Pt. will write one sentence with 75% legibility and efficiently while maintaing grasp on pen 100% of the time.    Time  12    Period  Weeks    Status  On-going    Target Date  02/26/18      OT LONG TERM GOAL  #10   TITLE  Pt. will independently use his left hand to grasp and efficiently accomodate to the varying weights of ADL objects during self care tasks.     Baseline  01/08/2018: Pt. continues to work on grasping and adjusting hand to the varying weights of objects.    Time  12    Period  Weeks    Status  On-going    Target Date  02/26/18      OT LONG TERM GOAL  #11   Baseline  01/08/2018: pt. has difficulty grasping, and manipulating objects when UEs elevated     Time  12    Period  Weeks    Status  On-going    Target Date  02/26/18            Plan - 01/26/18 0950    Clinical Impression Statement Pt. reports that he is preparing to go to [facility] in a week, and a 1/2.  Pt. plans to go golfing this afternoon. Pt. continues to work on improving left hand function skills, and translatory movements of the hand. Pt.'s left thumb is not as stiff today, and was able to move more freely. Pt. does tend to drop objects when challenged with moving  More complex objects, and required cues to move the objects distally to the tip of the 2nd digit. Pt. continues to work on improving hand function for improved  engagement in ADL, and IADL tasks, and writing legibility.    Occupational performance deficits (Please refer to evaluation for details):  ADL's;IADL's  Rehab Potential  Excellent    OT Frequency  2x / week    OT Treatment/Interventions  Self-care/ADL training;Therapeutic exercise;Neuromuscular education;Patient/family education;Energy conservation;Therapeutic activities;DME and/or AE instruction    Clinical Decision Making  Several treatment options, min-mod task modification necessary    Consulted and Agree with Plan of Care  Patient       Patient will benefit from skilled therapeutic intervention in order to improve the following deficits and impairments:  Abnormal gait, Pain, Decreased strength, Decreased range of motion, Impaired UE functional use, Decreased coordination, Decreased knowledge of use of DME, Decreased activity tolerance, Difficulty walking, Decreased balance, Other (comment)  Visit Diagnosis: Muscle weakness (generalized)  Other lack of coordination    Problem List Patient Active Problem List   Diagnosis Date Noted  . Pure hypercholesterolemia 05/28/2017  . Erectile dysfunction 05/28/2017  . Left hemiparesis (HCC)   . Essential hypertension 11/29/2016  . 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 Hepatitis C 07/22/2013    Olegario MessierElaine Kayan Blissett, MS, OTR/L 01/26/2018, 1:24 PM  [facility] [address] [place], [place], [address] Phone: [phone]   Fax:  [phone]  Name: Jason Nolan MRN: [id number] Date of Birth: 22-Sep-1945

## 2018-01-28 ENCOUNTER — Ambulatory Visit: Payer: Medicare Other | Admitting: Occupational Therapy

## 2018-01-28 ENCOUNTER — Encounter: Payer: Self-pay | Admitting: Occupational Therapy

## 2018-01-28 DIAGNOSIS — R278 Other lack of coordination: Secondary | ICD-10-CM

## 2018-01-28 DIAGNOSIS — M6281 Muscle weakness (generalized): Secondary | ICD-10-CM

## 2018-01-28 DIAGNOSIS — Z23 Encounter for immunization: Secondary | ICD-10-CM | POA: Diagnosis not present

## 2018-01-28 NOTE — Therapy (Addendum)
[facility] MAIN [facility] SERVICES [address] [place], [place], [address] Phone: [phone]   Fax:  [phone]  Occupational Therapy Treatment  Patient Details  Name: Adrienne Delay MRN: [id number] Date of Birth: January 06, 1946 No data recorded  Encounter Date: 01/28/2018  OT End of Session - 01/28/18 1248    Visit Number  75    Number of Visits  96    Date for OT Re-Evaluation  02/26/18    Authorization Type  Medicare 6 of 10, reporting period starting 01/12/2018    OT Start Time  0930    OT Stop Time  1015    OT Time Calculation (min)  45 min    Activity Tolerance  Patient tolerated treatment well    Behavior During Therapy  [facility] for tasks assessed/performed       Past Medical History:  Diagnosis Date  . Glaucoma   . Hepatitis C   . Stroke [facility])     Past Surgical History:  Procedure Laterality Date  . IR ANGIO INTRA EXTRACRAN SEL COM CAROTID INNOMINATE UNI L MOD SED  11/25/2016  . IR ANGIO VERTEBRAL SEL SUBCLAVIAN INNOMINATE UNI R MOD SED  11/25/2016  . IR ANGIO VERTEBRAL SEL VERTEBRAL UNI L MOD SED  11/25/2016  . IR INTRAVSC STENT CERV CAROTID W/O EMB-PROT MOD SED INC ANGIO  11/25/2016  . IR PERCUTANEOUS ART THROMBECTOMY/INFUSION INTRACRANIAL INC DIAG ANGIO  11/25/2016  . IR RADIOLOGIST EVAL & MGMT  02/13/2017  . RADIOLOGY WITH ANESTHESIA N/A 11/25/2016   Procedure: RADIOLOGY WITH ANESTHESIA;  Surgeon: Radiologist, Medication, MD;  Location: [facility] OR;  Service: Radiology;  Laterality: N/A;    There were no vitals filed for this visit.  Subjective Assessment - 01/28/18 1246    Subjective   Pt. has plans to golf again on Friday.    Patient is accompained by:  Family member    Pertinent History  Pt. is a 72 y.o. 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 Pt. was transferred to [facility]. He underwent CTA brain showed short segment near occlusion of proximal R-ICA with associated intraluminal thrombus --likely due to dissection and  emergent large vessel occlusion with right M2 occlusion and evolving right frontal lobe infarct.  He underwent cerebral angio with  complete revascularization of occluded superior division of R-MCA and near complete revascularization of proximal R-ICA with stent assisted angioplasty.  Stroke felt to be embolic in setting of ruptured plaque R-ICA and he was placed on ASA and brillinta due to stent. Pt. was transferred to inpatient rehab for several weeks, had home health therapy upon discharge, and now is ready for outpatient therapy services.    Limitations  Dominant LUE functioning, left hand edema, distal ROM, coordination, and overal strength.    Patient Stated Goals  To regain the use of his LUE.    Currently in Pain?  No/denies       OT TREATMENT  Neuromuscular re-education:  Pt. Worked on translatory movements of the hand moving progressively smaller objects in his hand from his palm to the tip of his 2nd digit, and thumb to place them in a container place at the tabletop. Emphasis was placed on thumb movement patterns. The Jebsen Taylor Hand Function Test was administered to assess bilateral hand function. Pt was educated on the use of the standardized assessment prior to administration. Scores were obtained for both hands. Scores demonstrate further treatment is required for skills associated with picking up small common objects,  simulated feeding, and simulated page turning for the L hand. During administration of the simulated page turning subtest, pt was unable to oppose thumb to 2nd and 3rd digit to fully grasp index card before turning it over. Pt had to readjust pen frequently during the writing subtest while using his L hand. Pt required increased time for all subtests using the L hand compared to the R hand with the greatest differences in times being for writing and picking up small objects (9 secs). Pt's standard score for each subtest using the L dominant hand are as  follows:  Writing: 6.82 Simulated page turning: 8.55 Picking up small common objects: 9.55 Simulated feeding: 8.76 Stacking checkers: 5.17 Picking up large light objects: 4.66 Picking up large heavy objects: 6.00                    OT Education - 01/28/18 1247    Education provided  Yes    Education Details  UE functioning    Person(s) Educated  Patient    Methods  Explanation          OT Long Term Goals - 01/08/18 1007      OT LONG TERM GOAL #3   Title  Pt. left hand grip with  increase by 5# in preparation for opening a jar.    Baseline  01/08/2018: improving    Time  12    Period  Weeks    Target Date  02/26/18      OT LONG TERM GOAL #6   Title  Pt. will improve left hand coordination skills to be able to button clothing.    Baseline  01/08/2018: Pt. has improved with left hand coordination skills, continues to have difficulty with buttoning.    Time  12    Period  Weeks    Status  On-going    Target Date  02/26/18      OT LONG TERM GOAL #8   Title  Pt. will increase Left pinch strength to be able to grasp and hold items during ADL tasks.    Baseline  01/08/2018: Pt. continues to progress with  pinch strength, and ability to grasp, howeevr has difficulty holding objects.    Time  12    Period  Weeks    Status  On-going    Target Date  02/26/18      OT LONG TERM GOAL  #9   Baseline  Pt. will write one sentence with 75% legibility and efficiently while maintaing grasp on pen 100% of the time.    Time  12    Period  Weeks    Status  On-going    Target Date  02/26/18      OT LONG TERM GOAL  #10   TITLE  Pt. will independently use his left hand to grasp and efficiently accomodate to the varying weights of ADL objects during self care tasks.     Baseline  01/08/2018: Pt. continues to work on grasping and adjusting hand to the varying weights of objects.    Time  12    Period  Weeks    Status  On-going    Target Date  02/26/18      OT LONG TERM  GOAL  #11   Baseline  01/08/2018: pt. has difficulty grasping, and manipulating objects when UEs elevated     Time  12    Period  Weeks    Status  On-going    Target Date  02/26/18            Plan - 01/28/18 1559    Clinical Impression Statement  Pt. conitnues to prepare for a trip to [facility] for his birthday. Pt. has been going out to play golf weekly. The Jebsen Taylor Hand Function Test was administered to assess bilateral hand function. Pt was educated on the use of the standardized assessment prior to administration. Scores were obtained for both hands. Scores demonstrate further treatment is required for skills associated with picking up small common objects, simulated feeding, and simulated page turning for the left hand. Refer to the treatment note for detailed objsective data. Pt. conintues to work on hand function skills in preparation form continued improvement with ADls, and IADLs.    Occupational performance deficits (Please refer to evaluation for details):  ADL's;IADL's    Rehab Potential  Excellent    OT Frequency  2x / week    OT Duration  12 weeks    OT Treatment/Interventions  Self-care/ADL training;Therapeutic exercise;Neuromuscular education;Patient/family education;Energy conservation;Therapeutic activities;DME and/or AE instruction    Clinical Decision Making  Several treatment options, min-mod task modification necessary    Consulted and Agree with Plan of Care  Patient       Patient will benefit from skilled therapeutic intervention in order to improve the following deficits and impairments:  Abnormal gait, Pain, Decreased strength, Decreased range of motion, Impaired UE functional use, Decreased coordination, Decreased knowledge of use of DME, Decreased activity tolerance, Difficulty walking, Decreased balance, Other (comment)  Visit Diagnosis: Muscle weakness (generalized)  Other lack of coordination    Problem List Patient Active Problem List    Diagnosis Date Noted  . Pure hypercholesterolemia 05/28/2017  . Erectile dysfunction 05/28/2017  . Left hemiparesis (HCC)   . Essential hypertension 11/29/2016  . 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 Hepatitis C 07/22/2013    Olegario Messier, OTS 01/28/2018, 4:00 PM   This entire session was performed under direct supervision and direction of a licensed therapist/therapist assistant . I have personally read, edited and approve of the note as written.  Olegario Messier, MS, OTR/L    [facility] MAIN [facility] SERVICES [address] [place], [place], [address] Phone: [phone]   Fax:  [phone]  Name: Sanav Remer MRN: [id number] Date of Birth: 01-Feb-1946

## 2018-02-01 ENCOUNTER — Encounter: Payer: Self-pay | Admitting: Internal Medicine

## 2018-02-01 DIAGNOSIS — B181 Chronic viral hepatitis B without delta-agent: Secondary | ICD-10-CM

## 2018-02-03 ENCOUNTER — Ambulatory Visit: Payer: Medicare Other | Attending: Internal Medicine | Admitting: Occupational Therapy

## 2018-02-03 ENCOUNTER — Encounter: Payer: Self-pay | Admitting: Occupational Therapy

## 2018-02-03 DIAGNOSIS — R278 Other lack of coordination: Secondary | ICD-10-CM | POA: Insufficient documentation

## 2018-02-03 DIAGNOSIS — M6281 Muscle weakness (generalized): Secondary | ICD-10-CM | POA: Diagnosis not present

## 2018-02-03 NOTE — Therapy (Signed)
[facility] MAIN [facility] SERVICES [address] [place], [place], [address] Phone: [phone]   Fax:  [phone]  Occupational Therapy Treatment  Patient Details  Name: Jason Nolan MRN: [id number] Date of Birth: 01/06/1946 No data recorded  Encounter Date: 02/03/2018  OT End of Session - 02/03/18 1043    Visit Number  76    Number of Visits  96    Date for OT Re-Evaluation  02/26/18    Authorization Type  Medicare 7 of 10, reporting period starting 01/12/2018    OT Start Time  1033    OT Stop Time  1115    OT Time Calculation (min)  42 min    Activity Tolerance  Patient tolerated treatment well    Behavior During Therapy  [facility] for tasks assessed/performed       Past Medical History:  Diagnosis Date  . Glaucoma   . Hepatitis C   . Stroke [facility])     Past Surgical History:  Procedure Laterality Date  . IR ANGIO INTRA EXTRACRAN SEL COM CAROTID INNOMINATE UNI L MOD SED  11/25/2016  . IR ANGIO VERTEBRAL SEL SUBCLAVIAN INNOMINATE UNI R MOD SED  11/25/2016  . IR ANGIO VERTEBRAL SEL VERTEBRAL UNI L MOD SED  11/25/2016  . IR INTRAVSC STENT CERV CAROTID W/O EMB-PROT MOD SED INC ANGIO  11/25/2016  . IR PERCUTANEOUS ART THROMBECTOMY/INFUSION INTRACRANIAL INC DIAG ANGIO  11/25/2016  . IR RADIOLOGIST EVAL & MGMT  02/13/2017  . RADIOLOGY WITH ANESTHESIA N/A 11/25/2016   Procedure: RADIOLOGY WITH ANESTHESIA;  Surgeon: Radiologist, Medication, MD;  Location: [facility] OR;  Service: Radiology;  Laterality: N/A;    There were no vitals filed for this visit.  Subjective Assessment - 02/03/18 1039    Subjective   Pt. reports not being very happy with his golf game.    Patient is accompained by:  Family member    Pertinent History  Pt. is a 72 y.o. 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 Pt. was transferred to [facility]. He underwent CTA brain showed short segment near occlusion of proximal R-ICA with associated intraluminal thrombus --likely due to  dissection and emergent large vessel occlusion with right M2 occlusion and evolving right frontal lobe infarct.  He underwent cerebral angio with  complete revascularization of occluded superior division of R-MCA and near complete revascularization of proximal R-ICA with stent assisted angioplasty.  Stroke felt to be embolic in setting of ruptured plaque R-ICA and he was placed on ASA and brillinta due to stent. Pt. was transferred to inpatient rehab for several weeks, had home health therapy upon discharge, and now is ready for outpatient therapy services.    Limitations  Dominant LUE functioning, left hand edema, distal ROM, coordination, and overal strength.    Patient Stated Goals  To regain the use of his LUE.    Currently in Pain?  No/denies      OT TREATMENT    Neuro muscular re-education:  Pt. performed [facility] skills training to improve speed and dexterity needed for ADL tasks and writing. Pt. demonstrated grasping 1 inch sticks, and  inch cylindrical collars on the Purdue pegboard. Pt. performed grasping the sticks with his 2nd digit and thumb, and storing them in the palm. Pt. Initially attempted to grasp the 1/4" collars from the purdue dish. As his hand fatigued pt. Required the collars to be placed in a magnetic dish. Pt. worked on removing the collars with the very tip of his  2nd digit, and thumb.                         OT Education - 02/03/18 1042    Education provided  Yes    Education Details  UE functioning    Person(s) Educated  Patient    Methods  Explanation    Comprehension  Verbalized understanding;Returned demonstration          OT Long Term Goals - 01/08/18 1007      OT LONG TERM GOAL #3   Title  Pt. left hand grip with  increase by 5# in preparation for opening a jar.    Baseline  01/08/2018: improving    Time  12    Period  Weeks    Target Date  02/26/18      OT LONG TERM GOAL #6   Title  Pt. will improve left hand coordination skills  to be able to button clothing.    Baseline  01/08/2018: Pt. has improved with left hand coordination skills, continues to have difficulty with buttoning.    Time  12    Period  Weeks    Status  On-going    Target Date  02/26/18      OT LONG TERM GOAL #8   Title  Pt. will increase Left pinch strength to be able to grasp and hold items during ADL tasks.    Baseline  01/08/2018: Pt. continues to progress with  pinch strength, and ability to grasp, howeevr has difficulty holding objects.    Time  12    Period  Weeks    Status  On-going    Target Date  02/26/18      OT LONG TERM GOAL  #9   Baseline  Pt. will write one sentence with 75% legibility and efficiently while maintaing grasp on pen 100% of the time.    Time  12    Period  Weeks    Status  On-going    Target Date  02/26/18      OT LONG TERM GOAL  #10   TITLE  Pt. will independently use his left hand to grasp and efficiently accomodate to the varying weights of ADL objects during self care tasks.     Baseline  01/08/2018: Pt. continues to work on grasping and adjusting hand to the varying weights of objects.    Time  12    Period  Weeks    Status  On-going    Target Date  02/26/18      OT LONG TERM GOAL  #11   Baseline  01/08/2018: pt. has difficulty grasping, and manipulating objects when UEs elevated     Time  12    Period  Weeks    Status  On-going    Target Date  02/26/18            Plan - 02/03/18 1046    Clinical Impression Statement Pt. continues to work on improving LUE hand function, [facility] skills, and translatory movements of the hand. Pt. continues to requires verbal cues to avoid over compensating proximally at the trunk when working on [facility] tasks. Pt. continues to work on improving LUE functioning for improved engagement during ADL, and IADL tasks.    Occupational performance deficits (Please refer to evaluation for details):  ADL's;IADL's    Rehab Potential  Excellent    OT Frequency  2x / week    OT Duration   12 weeks  OT Treatment/Interventions  Self-care/ADL training;Therapeutic exercise;Neuromuscular education;Patient/family education;Energy conservation;Therapeutic activities;DME and/or AE instruction    Clinical Decision Making  Several treatment options, min-mod task modification necessary    Consulted and Agree with Plan of Care  Patient       Patient will benefit from skilled therapeutic intervention in order to improve the following deficits and impairments:  Abnormal gait, Pain, Decreased strength, Decreased range of motion, Impaired UE functional use, Decreased coordination, Decreased knowledge of use of DME, Decreased activity tolerance, Difficulty walking, Decreased balance, Other (comment)  Visit Diagnosis: Muscle weakness (generalized)  Other lack of coordination    Problem List Patient Active Problem List   Diagnosis Date Noted  . Pure hypercholesterolemia 05/28/2017  . Erectile dysfunction 05/28/2017  . Left hemiparesis (HCC)   . Essential hypertension 11/29/2016  . 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 Hepatitis C 07/22/2013    Olegario Messier, MS, OTR/L 02/03/2018, 10:57 AM  [facility] MAIN [facility] SERVICES [address] [place], [place], [address] Phone: [phone]   Fax:  [phone]  Name: Irby Fails MRN: [id number] Date of Birth: 1946/03/28

## 2018-02-05 ENCOUNTER — Ambulatory Visit: Payer: Medicare Other | Admitting: Occupational Therapy

## 2018-02-05 ENCOUNTER — Encounter: Payer: Self-pay | Admitting: Occupational Therapy

## 2018-02-05 ENCOUNTER — Ambulatory Visit: Payer: Medicare Other | Admitting: Physical Therapy

## 2018-02-05 DIAGNOSIS — M6281 Muscle weakness (generalized): Secondary | ICD-10-CM | POA: Diagnosis not present

## 2018-02-05 DIAGNOSIS — R278 Other lack of coordination: Secondary | ICD-10-CM

## 2018-02-05 NOTE — Therapy (Signed)
[facility] MAIN [facility] SERVICES [address] [place], [place], [address] Phone: [phone]   Fax:  [phone]  Occupational Therapy Treatment  Patient Details  Name: Jason Nolan MRN: [id number] Date of Birth: 1945/08/03 No data recorded  Encounter Date: 02/05/2018  OT End of Session - 02/05/18 0856    Visit Number  77    Number of Visits  96    Authorization Type  Medicare 8 of 10, reporting period starting 01/12/2018    OT Start Time  0845    OT Stop Time  0930    OT Time Calculation (min)  45 min    Activity Tolerance  Patient tolerated treatment well    Behavior During Therapy  [facility] for tasks assessed/performed       Past Medical History:  Diagnosis Date  . Glaucoma   . Hepatitis C   . Stroke [facility])     Past Surgical History:  Procedure Laterality Date  . IR ANGIO INTRA EXTRACRAN SEL COM CAROTID INNOMINATE UNI L MOD SED  11/25/2016  . IR ANGIO VERTEBRAL SEL SUBCLAVIAN INNOMINATE UNI R MOD SED  11/25/2016  . IR ANGIO VERTEBRAL SEL VERTEBRAL UNI L MOD SED  11/25/2016  . IR INTRAVSC STENT CERV CAROTID W/O EMB-PROT MOD SED INC ANGIO  11/25/2016  . IR PERCUTANEOUS ART THROMBECTOMY/INFUSION INTRACRANIAL INC DIAG ANGIO  11/25/2016  . IR RADIOLOGIST EVAL & MGMT  02/13/2017  . RADIOLOGY WITH ANESTHESIA N/A 11/25/2016   Procedure: RADIOLOGY WITH ANESTHESIA;  Surgeon: Radiologist, Medication, MD;  Location: [facility] OR;  Service: Radiology;  Laterality: N/A;    There were no vitals filed for this visit.  Subjective Assessment - 02/05/18 0853    Subjective   Pt. reports that he went to play golf this week.    Patient is accompained by:  Family member    Pertinent History  Pt. is a 72 y.o. 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 Pt. was transferred to [facility]. He underwent CTA brain showed short segment near occlusion of proximal R-ICA with associated intraluminal thrombus --likely due to dissection and emergent large vessel occlusion  with right M2 occlusion and evolving right frontal lobe infarct.  He underwent cerebral angio with  complete revascularization of occluded superior division of R-MCA and near complete revascularization of proximal R-ICA with stent assisted angioplasty.  Stroke felt to be embolic in setting of ruptured plaque R-ICA and he was placed on ASA and brillinta due to stent. Pt. was transferred to inpatient rehab for several weeks, had home health therapy upon discharge, and now is ready for outpatient therapy services.    Limitations  Dominant LUE functioning, left hand edema, distal ROM, coordination, and overal strength.    Patient Stated Goals  To regain the use of his LUE.    Currently in Pain?  No/denies      OT TREATMENT    Neuro muscular re-education:  Pt. worked on [facility] skills manipulating 1/2" washers with his 2nd digit, and thumb. Pt. Worked on placing them over vertical dowels. Pt. performed [facility] skills training to improve speed and dexterity needed for ADL tasks and writing. Pt. demonstrated grasping 1 inch sticks on the Purdue pegboard. Pt. performed grasping, and storing 1" sticks in the palm of his hand. Pt. Worked on translatory movements of the hand moving the objects from the palm to the tip of his 2nd digit and thumb.  OT Education - 02/05/18 0855    Education provided  Yes    Education Details  UE functioning    Person(s) Educated  Patient    Methods  Explanation    Comprehension  Verbalized understanding;Returned demonstration          OT Long Term Goals - 01/08/18 1007      OT LONG TERM GOAL #3   Title  Pt. left hand grip with  increase by 5# in preparation for opening a jar.    Baseline  01/08/2018: improving    Time  12    Period  Weeks    Target Date  02/26/18      OT LONG TERM GOAL #6   Title  Pt. will improve left hand coordination skills to be able to button clothing.    Baseline  01/08/2018: Pt. has improved with left hand  coordination skills, continues to have difficulty with buttoning.    Time  12    Period  Weeks    Status  On-going    Target Date  02/26/18      OT LONG TERM GOAL #8   Title  Pt. will increase Left pinch strength to be able to grasp and hold items during ADL tasks.    Baseline  01/08/2018: Pt. continues to progress with  pinch strength, and ability to grasp, howeevr has difficulty holding objects.    Time  12    Period  Weeks    Status  On-going    Target Date  02/26/18      OT LONG TERM GOAL  #9   Baseline  Pt. will write one sentence with 75% legibility and efficiently while maintaing grasp on pen 100% of the time.    Time  12    Period  Weeks    Status  On-going    Target Date  02/26/18      OT LONG TERM GOAL  #10   TITLE  Pt. will independently use his left hand to grasp and efficiently accomodate to the varying weights of ADL objects during self care tasks.     Baseline  01/08/2018: Pt. continues to work on grasping and adjusting hand to the varying weights of objects.    Time  12    Period  Weeks    Status  On-going    Target Date  02/26/18      OT LONG TERM GOAL  #11   Baseline  01/08/2018: pt. has difficulty grasping, and manipulating objects when UEs elevated     Time  12    Period  Weeks    Status  On-going    Target Date  02/26/18            Plan - 02/05/18 0907    Clinical Impression Statement  Pt. is preparing to go on vacation next week to [facility]. Pt. reports that numbness, and tingling has improved in his LUE. Pt. continues to work on improving LUE strength, [facility] skills, and translatory movements of the hand in order to improve UE functioning during ADLs, and IADLs.    Occupational performance deficits (Please refer to evaluation for details):  ADL's;IADL's    Rehab Potential  Excellent    OT Frequency  2x / week    OT Duration  12 weeks    OT Treatment/Interventions  Self-care/ADL training;Therapeutic exercise;Neuromuscular education;Patient/family  education;Energy conservation;Therapeutic activities;DME and/or AE instruction    Consulted and Agree with Plan of Care  Patient  Patient will benefit from skilled therapeutic intervention in order to improve the following deficits and impairments:  Abnormal gait, Pain, Decreased strength, Decreased range of motion, Impaired UE functional use, Decreased coordination, Decreased knowledge of use of DME, Decreased activity tolerance, Difficulty walking, Decreased balance, Other (comment)  Visit Diagnosis: Muscle weakness (generalized)  Other lack of coordination    Problem List Patient Active Problem List   Diagnosis Date Noted  . Pure hypercholesterolemia 05/28/2017  . Erectile dysfunction 05/28/2017  . Left hemiparesis (HCC)   . Essential hypertension 11/29/2016  . 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 Hepatitis C 07/22/2013    Olegario Messier, MS, OTR/L 02/05/2018, 9:21 AM  [place] [facility] MAIN [facility] SERVICES [address] [place], [place], [address] Phone: [phone]   Fax:  [phone]  Name: Jason Nolan MRN: [id number] Date of Birth: 1945/12/18

## 2018-02-06 ENCOUNTER — Other Ambulatory Visit: Payer: Self-pay | Admitting: Internal Medicine

## 2018-02-06 DIAGNOSIS — I10 Essential (primary) hypertension: Secondary | ICD-10-CM

## 2018-02-10 ENCOUNTER — Ambulatory Visit: Payer: Medicare Other | Admitting: Occupational Therapy

## 2018-02-10 ENCOUNTER — Encounter: Payer: Self-pay | Admitting: Occupational Therapy

## 2018-02-10 DIAGNOSIS — M6281 Muscle weakness (generalized): Secondary | ICD-10-CM | POA: Diagnosis not present

## 2018-02-10 DIAGNOSIS — R278 Other lack of coordination: Secondary | ICD-10-CM | POA: Diagnosis not present

## 2018-02-10 NOTE — Therapy (Signed)
[place] [facility] MAIN [facility] SERVICES [address] [place], [place], [address] Phone: [phone]   Fax:  [phone]  Occupational Therapy Treatment  Patient Details  Name: Jason Nolan MRN: [id number] Date of Birth: May 14, 1945 No data recorded  Encounter Date: 02/10/2018  OT End of Session - 02/10/18 1002    Visit Number  78    Number of Visits  96    Date for OT Re-Evaluation  02/26/18    Authorization Type  Medicare 9 of 10, reporting period starting 01/12/2018    OT Start Time  0900    OT Stop Time  0945    OT Time Calculation (min)  45 min    Activity Tolerance  Patient tolerated treatment well    Behavior During Therapy  [facility] for tasks assessed/performed       Past Medical History:  Diagnosis Date  . Glaucoma   . Hepatitis C   . Stroke [facility])     Past Surgical History:  Procedure Laterality Date  . IR ANGIO INTRA EXTRACRAN SEL COM CAROTID INNOMINATE UNI L MOD SED  11/25/2016  . IR ANGIO VERTEBRAL SEL SUBCLAVIAN INNOMINATE UNI R MOD SED  11/25/2016  . IR ANGIO VERTEBRAL SEL VERTEBRAL UNI L MOD SED  11/25/2016  . IR INTRAVSC STENT CERV CAROTID W/O EMB-PROT MOD SED INC ANGIO  11/25/2016  . IR PERCUTANEOUS ART THROMBECTOMY/INFUSION INTRACRANIAL INC DIAG ANGIO  11/25/2016  . IR RADIOLOGIST EVAL & MGMT  02/13/2017  . RADIOLOGY WITH ANESTHESIA N/A 11/25/2016   Procedure: RADIOLOGY WITH ANESTHESIA;  Surgeon: Radiologist, Medication, MD;  Location: [facility] OR;  Service: Radiology;  Laterality: N/A;    There were no vitals filed for this visit.  Subjective Assessment - 02/10/18 0932    Subjective   Pt. is preparing for a vacation to [facility].    Patient is accompained by:  Family member    Pertinent History  Pt. is a 72 y.o. 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 Pt. was transferred to [facility]. He underwent CTA brain showed short segment near occlusion of proximal R-ICA with associated intraluminal thrombus --likely due to  dissection and emergent large vessel occlusion with right M2 occlusion and evolving right frontal lobe infarct.  He underwent cerebral angio with  complete revascularization of occluded superior division of R-MCA and near complete revascularization of proximal R-ICA with stent assisted angioplasty.  Stroke felt to be embolic in setting of ruptured plaque R-ICA and he was placed on ASA and brillinta due to stent. Pt. was transferred to inpatient rehab for several weeks, had home health therapy upon discharge, and now is ready for outpatient therapy services.    Limitations  Dominant LUE functioning, left hand edema, distal ROM, coordination, and overal strength.    Patient Stated Goals  To regain the use of his LUE.    Currently in Pain?  No/denies      OT TREATMENT    Neuro muscular re-education:  Pt. worked on grasping 1/2" flat washers, and placed them on vertical dowels. Pt. worked on controlling the movements to reach for the targets. Pt. worked on removing the pegs alternating thumb opposition to the tip of his 2nd digit through 5th digits. Pt. worked on grasping the washers, and storing them in the palm of his hand. Pt. required increased time to complete.    Self-care:   Pt. was provided with The Pencil Grip. Pt. worked on prewriting exercises with the pen grip in  his left hand. Pt. worked on prewriting exercises. Pt. Was more legible with with print form instead of cursive.                       OT Education - 02/10/18 1001    Education provided  Yes    Education Details  UE functioning    Person(s) Educated  Patient    Methods  Explanation    Comprehension  Verbalized understanding;Returned demonstration          OT Long Term Goals - 01/08/18 1007      OT LONG TERM GOAL #3   Title  Pt. left hand grip with  increase by 5# in preparation for opening a jar.    Baseline  01/08/2018: improving    Time  12    Period  Weeks    Target Date  02/26/18      OT  LONG TERM GOAL #6   Title  Pt. will improve left hand coordination skills to be able to button clothing.    Baseline  01/08/2018: Pt. has improved with left hand coordination skills, continues to have difficulty with buttoning.    Time  12    Period  Weeks    Status  On-going    Target Date  02/26/18      OT LONG TERM GOAL #8   Title  Pt. will increase Left pinch strength to be able to grasp and hold items during ADL tasks.    Baseline  01/08/2018: Pt. continues to progress with  pinch strength, and ability to grasp, howeevr has difficulty holding objects.    Time  12    Period  Weeks    Status  On-going    Target Date  02/26/18      OT LONG TERM GOAL  #9   Baseline  Pt. will write one sentence with 75% legibility and efficiently while maintaing grasp on pen 100% of the time.    Time  12    Period  Weeks    Status  On-going    Target Date  02/26/18      OT LONG TERM GOAL  #10   TITLE  Pt. will independently use his left hand to grasp and efficiently accomodate to the varying weights of ADL objects during self care tasks.     Baseline  01/08/2018: Pt. continues to work on grasping and adjusting hand to the varying weights of objects.    Time  12    Period  Weeks    Status  On-going    Target Date  02/26/18      OT LONG TERM GOAL  #11   Baseline  01/08/2018: pt. has difficulty grasping, and manipulating objects when UEs elevated     Time  12    Period  Weeks    Status  On-going    Target Date  02/26/18            Plan - 02/10/18 1453    Clinical Impression Statement  Pt. is leaving for [facility].  Pt. has difficulty with hand function, and performing translatory movements of the hand efficiently. Pt. continues to work on improving left hand function skills, and [facility] skills including moving objects through his hand, performing thumb opposition, and writing legibility.    Occupational performance deficits (Please refer to evaluation for details):  ADL's;IADL's    Rehab  Potential  Excellent    OT Frequency  2x /  week    OT Duration  12 weeks    OT Treatment/Interventions  Self-care/ADL training;Therapeutic exercise;Neuromuscular education;Patient/family education;Energy conservation;Therapeutic activities;DME and/or AE instruction    Clinical Decision Making  Several treatment options, min-mod task modification necessary    Consulted and Agree with Plan of Care  Patient       Patient will benefit from skilled therapeutic intervention in order to improve the following deficits and impairments:  Abnormal gait, Pain, Decreased strength, Decreased range of motion, Impaired UE functional use, Decreased coordination, Decreased knowledge of use of DME, Decreased activity tolerance, Difficulty walking, Decreased balance, Other (comment)  Visit Diagnosis: Muscle weakness (generalized)  Other Nolan of coordination    Problem List Patient Active Problem List   Diagnosis Date Noted  . Pure hypercholesterolemia 05/28/2017  . Erectile dysfunction 05/28/2017  . Left hemiparesis (HCC)   . Essential hypertension 11/29/2016  . 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 Hepatitis C 07/22/2013    Olegario Messier, MS, OTR/L 02/10/2018, 3:00 PM  [facility] MAIN [facility] SERVICES [address] [place], [place], [address] Phone: [phone]   Fax:  [phone]  Name: Jason Nolan MRN: [id number] Date of Birth: 12-17-1945

## 2018-02-12 ENCOUNTER — Encounter: Payer: Medicare Other | Admitting: Occupational Therapy

## 2018-02-17 ENCOUNTER — Encounter: Payer: Medicare Other | Admitting: Occupational Therapy

## 2018-02-19 ENCOUNTER — Encounter: Payer: Medicare Other | Admitting: Occupational Therapy

## 2018-02-23 ENCOUNTER — Encounter: Payer: Medicare Other | Admitting: Occupational Therapy

## 2018-02-23 NOTE — Progress Notes (Signed)
[facility]  PATIENT: Jason Nolan DOB: 1945-09-06   REASON FOR VISIT: Follow-up for stroke in July 2018 HISTORY FROM: Patient alone at visit HISTORY OF PRESENT ILLNESS: 02/10/17 PSMr. Jason Nolan is a pleasant 72 year old African-American male seen today for the first office follow-up visit following hospital admission for stroke in July 2018. He is accompanied by his wife. History is obtained from them as well as review of hospital medical records and have personally reviewed imaging films. Jason Nolan a 72 y.o.left-handed malewho has a past medical history of hepatitis C treated with Harvoni, who presented with Left-sided weakness, slurred speech, left facial droop. He was in his usual state of health and last normal at 2 AM when he went to bed, woke up on the morning of 7/23/2018around 9 AM with left-sided weakness, left facial droop and slurred speech. 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 His NIH stroke scale at that hospital was recorded to be 6. Noncontrast head CT showed early changes of ischemia in the right MCA territory and a hyperdense right MCA. He was transferred over to [facility] for evaluation for possible thrombectomy. He was not given IV TPA because she was outside of the TPA window at the time of presentation. The patient is now s/p thrombectomy and revascularization of superior division of R MCA with T1C1 reperfusion.  LKW: 0200 on 11/25/2016. Modified Rankin Score:0 Patient was not administered IV t-PA secondary to arriving outside of the treatment widnow. Hewas admitted to the neuro ICU for further evaluation and treatment.The patient underwent emergent proximal right ICA angioplasty with stenting followed by mechanical thrombectomy of the right MCA clot using solitaire device with good recanalization. Patient did clinically well and was following commands. He  was extubated and did well. He had mild left facial droop and significant left arm weakness but he was able to move his left leg against gravity. He was seen by physical occupational speech therapy and felt to be a good patient for inpatient rehabilitation. He was placed on aspirin and brillintadue to his fresh carotid stent and transferred to rehabilitation in stable condition. He has been home now versus several weeks from rehabilitation. He has finished outpatient therapy. Is able to walk independently with a cane. He has only minimal dragging of the left leg. He's up in improvement in his left hand weakness as well. He still has significant weakness of left grip and hand muscles. He is able to bend his fingers and has a weak grip. He is tolerating aspirin and Brilinta without bruising or bleeding. He has started driving recently. He is not been able to play golf or no to the gym. He has an appointment to see Dr. Corliss Skains later this week. He complains of mild headaches off and on but this also seem to be getting better. He is tolerating Lipitor well without muscle aches and pains. He states his blood pressure is well controlled and today it is 134/79. UPDATE 4/22/2019CM Jason Nolan, 72 year old male returns for follow-up with a history of hospital admission for stroke in July 2018. The patient underwent emergent proximal right ICA angioplasty with stenting followed by mechanical thrombectomy of the right MCA clot using solitaire device with good recanalization.  He was to stay on Brilinta and aspirin for 6 months then aspirin only.  He has not had further stroke or TIA symptoms, he has not had any bleeding.  He is going to  the gym several times a week and using the treadmill.  He continues with occupational therapy.  He still cannot play golf.  He is driving without difficulty blood pressure in the office today 114/68.  He remains on Lipitor without myalgias.  He returns for reevaluation UPDATE 10/22/2019CM  Jason Nolan, 72 year old male returns for follow-up with history of admission to the hospital July 2018.  He is currently on aspirin without further stroke or TIA symptoms.  He has minimal bruising and no bleeding.  He remains on Lipitor without myalgias.  Blood pressure in the office today109/58.  He plays golf.  He goes to the gym several times a week.  He continues to get some occupational therapy for that left hand which is going to conclude soon.  He is driving without difficulty.  He returns for reevaluation  REVIEW OF SYSTEMS: Full 14 system review of systems performed and notable only for those listed, all others are neg:  Constitutional: neg  Cardiovascular: neg Ear/Nose/Throat: Hearing loss Skin: neg Eyes: neg Respiratory: neg Gastroitestinal: neg  Hematology/Lymphatic: neg  Endocrine: neg Musculoskeletal:neg Allergy/Immunology: neg Neurological: neg Psychiatric: neg Sleep : neg   ALLERGIES: No Known Allergies  HOME MEDICATIONS: Outpatient Medications Prior to Visit  Medication Sig Dispense Refill  . amLODipine (NORVASC) 10 MG tablet TAKE 1 TABLET DAILY 90 tablet 2  . aspirin 81 MG chewable tablet Chew 1 tablet (81 mg total) by mouth daily.    Marland Kitchen atorvastatin (LIPITOR) 40 MG tablet TAKE 1 TABLET DAILY AT 6 P.M. 90 tablet 1  . dorzolamide-timolol (COSOPT) 22.3-6.8 MG/ML ophthalmic solution Place 1 drop into both eyes.     . tadalafil (CIALIS) 20 MG tablet TAKE 0.5-1 TABLETS (10-20 MG TOTAL) BY MOUTH EVERY OTHER DAY AS NEEDED FOR ERECTILE DYSFUNCTION.  2  . VYZULTA 0.024 % SOLN Apply 1 drop to eye at bedtime.     Marland Kitchen amLODipine (NORVASC) 10 MG tablet TAKE 1 TABLET BY MOUTH EVERY DAY 90 tablet 0  . naproxen (NAPROSYN) 500 MG tablet Take 1 tablet (500 mg total) by mouth 2 (two) times daily with a meal. 30 tablet 0  . orphenadrine (NORFLEX) 100 MG tablet Take 1 tablet (100 mg total) by mouth 2 (two) times daily. 20 tablet 0  . PAZEO 0.7 % SOLN Place 1 drop into both eyes daily.       No facility-administered medications prior to visit.     PAST MEDICAL HISTORY: Past Medical History:  Diagnosis Date  . Glaucoma   . Hepatitis C   . Stroke [facility])     PAST SURGICAL HISTORY: Past Surgical History:  Procedure Laterality Date  . IR ANGIO INTRA EXTRACRAN SEL COM CAROTID INNOMINATE UNI L MOD SED  11/25/2016  . IR ANGIO VERTEBRAL SEL SUBCLAVIAN INNOMINATE UNI R MOD SED  11/25/2016  . IR ANGIO VERTEBRAL SEL VERTEBRAL UNI L MOD SED  11/25/2016  . IR INTRAVSC STENT CERV CAROTID W/O EMB-PROT MOD SED INC ANGIO  11/25/2016  . IR PERCUTANEOUS ART THROMBECTOMY/INFUSION INTRACRANIAL INC DIAG ANGIO  11/25/2016  . IR RADIOLOGIST EVAL & MGMT  02/13/2017  . RADIOLOGY WITH ANESTHESIA N/A 11/25/2016   Procedure: RADIOLOGY WITH ANESTHESIA;  Surgeon: Radiologist, Medication, MD;  Location: [facility] OR;  Service: Radiology;  Laterality: N/A;    FAMILY HISTORY: Family History  Problem Relation Age of Onset  . Hypertension Mother   . Hypertension Father     SOCIAL HISTORY: Social History   Socioeconomic History  . Marital status: Married  Spouse name: Not on file  . Number of children: Not on file  . Years of education: Not on file  . Highest education level: Not on file  Occupational History  . Not on file  Social Needs  . Financial resource strain: Not on file  . Food insecurity:    Worry: Not on file    Inability: Not on file  . Transportation needs:    Medical: Not on file    Non-medical: Not on file  Tobacco Use  . Smoking status: Former Smoker    Packs/day: 0.50    Years: 15.00    Pack years: 7.50    Last attempt to quit: 1990    Years since quitting: 29.8  . Smokeless tobacco: Never Used  Substance and Sexual Activity  . Alcohol use: No    Alcohol/week: 0.0 standard drinks  . Drug use: No  . Sexual activity: Yes    Partners: Female    Birth control/protection: None  Lifestyle  . Physical activity:    Days per week: Not on file    Minutes per session: Not on  file  . Stress: Not on file  Relationships  . Social connections:    Talks on phone: Not on file    Gets together: Not on file    Attends religious service: Not on file    Active member of club or organization: Not on file    Attends meetings of clubs or organizations: Not on file    Relationship status: Not on file  . Intimate partner violence:    Fear of current or ex partner: Not on file    Emotionally abused: Not on file    Physically abused: Not on file    Forced sexual activity: Not on file  Other Topics Concern  . Not on file  Social History Narrative  . Not on file     PHYSICAL EXAM  Vitals:   02/24/18 1238  BP: (!) 109/58  Pulse: (!) 49  Weight: 194 lb 12.8 oz (88.4 kg)  Height: 5\' 10"  (1.778 m)   Body mass index is 27.95 kg/m.  Generalized: Well developed, in no acute distress  Head: normocephalic and atraumatic,. Oropharynx benign  Neck: Supple, no carotid bruits  Cardiac: Regular rate rhythm, no murmur  Musculoskeletal: No deformity   Neurological examination   Mentation: Alert oriented to time, place, history taking. Attention span and concentration appropriate. Recent and remote memory intact.  Follows all commands speech and language fluent.   Cranial nerve II-XII: .Pupils were equal round reactive to light extraocular movements were full, visual field were full on confrontational test.  No facial weakness . hearing was intact to finger rubbing bilaterally. Uvula tongue midline. head turning and shoulder shrug were normal and symmetric.Tongue protrusion into cheek strength was normal. Motor: normal bulk and tone, full strength in the BUE, BLE, except mild left hand and grip weakness with diminished fine finger movements on the left  Sensory: normal and symmetric to light touch, pinprick, and  Vibration, in the upper and lower extremities Coordination: finger-nose-finger, heel-to-shin bilaterally, no dysmetria Reflexes: 1+ upper and lower and symmetric,  plantar responses were flexor bilaterally. Gait and Station: Rising up from seated position without assistance, normal stance,  moderate stride, good arm swing, smooth turning, able to perform tiptoe, and heel walking without difficulty. Tandem gait is steady.  No assistive device DIAGNOSTIC DATA (LABS, IMAGING, TESTING) - I reviewed patient records, labs, notes, testing and imaging myself where available.  Lab Results  Component Value Date   WBC 3.0 (L) 06/20/2017   HGB 14.8 06/20/2017   HCT 44.6 06/20/2017   MCV 92.1 06/20/2017   PLT 202 06/20/2017      Component Value Date/Time   NA 140 05/28/2017 1506   K 4.0 05/28/2017 1506   CL 104 05/28/2017 1506   CO2 25 05/28/2017 1506   GLUCOSE 81 05/28/2017 1506   BUN 14 05/28/2017 1506   CREATININE 0.91 05/28/2017 1506   CALCIUM 9.4 05/28/2017 1506   PROT 7.6 05/28/2017 1506   ALBUMIN 4.7 05/28/2017 1506   AST 21 05/28/2017 1506   ALT 32 05/28/2017 1506   ALKPHOS 68 05/28/2017 1506   BILITOT 0.9 05/28/2017 1506   GFRNONAA >60 12/19/2016 0406   GFRAA >60 12/19/2016 0406   Lab Results  Component Value Date   CHOL 125 05/28/2017   HDL 68.00 05/28/2017   LDLCALC 49 05/28/2017   TRIG 41.0 05/28/2017   CHOLHDL 2 05/28/2017   Lab Results  Component Value Date   HGBA1C 5.4 11/26/2016   Lab Results  Component Value Date   VITAMINB12 271 06/20/2017       ASSESSMENT AND PLAN 72year African-American male with embolic right MCA infarct in July 2018 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 Mild residual left hand weakness. Vascular risk factors of hypertension, hyperlipidemia and carotid dissection.    PLAN:Stressed the importance of management of risk factors to prevent further stroke Continue aspirin  for secondary stroke prevention  Maintain strict control of hypertension with blood pressure goal below 130/90, today's  reading 109/58 continue antihypertensive medications Control of diabetes with hemoglobin A1c below 6.5 followed by primary care Cholesterol with LDL cholesterol less than 70, followed by primary care,   continue statin drug Lipitor  Exercise by walking, playing golf  eat healthy diet with whole grains,  fresh fruits and vegetables Follow-up with primary care for stroke risk factor modification, maintain blood pressure goal less than 130 systolic, diabetes with A1c below 7, lipids with LDL below 70 Will discharge from stroke clinic I spent 25 minutes in total face to face time with the patient more than 50% of which was spent counseling and coordination of care, reviewing test results reviewing medications and discussing and reviewing the diagnosis of stroke and management of risk factors and further treatment options.  Patient given a copy of stroke prevention and this was reviewed with him, Nilda Riggs, [facility], [facility], APRN  [facility] [address] [place], [place] [address] [phone]

## 2018-02-24 ENCOUNTER — Ambulatory Visit (INDEPENDENT_AMBULATORY_CARE_PROVIDER_SITE_OTHER): Payer: Medicare Other | Admitting: Nurse Practitioner

## 2018-02-24 ENCOUNTER — Encounter: Payer: Self-pay | Admitting: Nurse Practitioner

## 2018-02-24 VITALS — BP 109/58 | HR 49 | Ht 70.0 in | Wt 194.8 lb

## 2018-02-24 DIAGNOSIS — E7800 Pure hypercholesterolemia, unspecified: Secondary | ICD-10-CM | POA: Diagnosis not present

## 2018-02-24 DIAGNOSIS — Z8673 Personal history of transient ischemic attack (TIA), and cerebral infarction without residual deficits: Secondary | ICD-10-CM | POA: Diagnosis not present

## 2018-02-24 DIAGNOSIS — I639 Cerebral infarction, unspecified: Secondary | ICD-10-CM

## 2018-02-24 DIAGNOSIS — I10 Essential (primary) hypertension: Secondary | ICD-10-CM

## 2018-02-24 NOTE — Patient Instructions (Addendum)
Stressed the importance of management of risk factors to prevent further stroke Continue aspirin  for secondary stroke prevention  Maintain strict control of hypertension with blood pressure goal below 130/90, today's reading 109/58 continue antihypertensive medications Control of diabetes with hemoglobin A1c below 6.5 followed by primary care Cholesterol with LDL cholesterol less than 70, followed by primary care,   continue statin drug Lipitor  Exercise by walking, playing golf  eat healthy diet with whole grains,  fresh fruits and vegetables Follow-up with primary care for stroke risk factor modification, maintain blood pressure goal less than 130 systolic, diabetes with A1c below 7, lipids with LDL below 70 Will discharge from stroke clinic  Stroke Prevention Some health problems and behaviors may make it more likely for you to have a stroke. Below are ways to lessen your risk of having a stroke.  Be active for at least 30 minutes on most or all days.  Do not smoke. Try not to be around others who smoke.  Do not drink too much alcohol. ? Do not have more than 2 drinks a day if you are a man. ? Do not have more than 1 drink a day if you are a woman and are not pregnant.  Eat healthy foods, such as fruits and vegetables. If you were put on a specific diet, follow the diet as told.  Keep your cholesterol levels under control through diet and medicines. Look for foods that are low in saturated fat, trans fat, cholesterol, and are high in fiber.  If you have diabetes, follow all diet plans and take your medicine as told.  Ask your doctor if you need treatment to lower your blood pressure. If you have high blood pressure (hypertension), follow all diet plans and take your medicine as told by your doctor.  If you are 37-57 years old, have your blood pressure checked every 3-5 years. If you are age 53 or older, have your blood pressure checked every year.  Keep a healthy weight. Eat foods  that are low in calories, salt, saturated fat, trans fat, and cholesterol.  Do not take drugs.  Avoid birth control pills, if this applies. Talk to your doctor about the risks of taking birth control pills.  Talk to your doctor if you have sleep problems (sleep apnea).  Take all medicine as told by your doctor. ? You may be told to take aspirin or blood thinner medicine. Take this medicine as told by your doctor. ? Understand your medicine instructions.  Make sure any other conditions you have are being taken care of.  Get help right away if:  You suddenly lose feeling (you feel numb) or have weakness in your face, arm, or leg.  Your face or eyelid hangs down to one side.  You suddenly feel confused.  You have trouble talking (aphasia) or understanding what people are saying.  You suddenly have trouble seeing in one or both eyes.  You suddenly have trouble walking.  You are dizzy.  You lose your balance or your movements are clumsy (uncoordinated).  You suddenly have a very bad headache and you do not know the cause.  You have new chest pain.  Your heart feels like it is fluttering or skipping a beat (irregular heartbeat). Do not wait to see if the symptoms above go away. Get help right away. Call your local emergency services (911 in [place] Do not drive yourself to the hospital. This information is not intended to replace advice given to  you by your health care provider. Make sure you discuss any questions you have with your health care provider. Document Released: 10/22/2011 Document Revised: 09/28/2015 Document Reviewed: 10/23/2012 Elsevier Interactive Patient Education  [employer].

## 2018-02-25 ENCOUNTER — Ambulatory Visit: Payer: Medicare Other | Admitting: Occupational Therapy

## 2018-02-25 ENCOUNTER — Encounter: Payer: Self-pay | Admitting: Occupational Therapy

## 2018-02-25 DIAGNOSIS — R278 Other lack of coordination: Secondary | ICD-10-CM | POA: Diagnosis not present

## 2018-02-25 DIAGNOSIS — M6281 Muscle weakness (generalized): Secondary | ICD-10-CM

## 2018-02-25 NOTE — Therapy (Addendum)
[facility] [facility] MAIN [facility] SERVICES [address] [place], [place], [address] Phone: [phone]   Fax:  [phone]  Occupational Therapy Treatment 10th Visit/Recertification Note   Patient Details  Name: Jason Nolan MRN: [id number] Date of Birth: 08-Mar-1946 No data recorded  Encounter Date: 02/25/2018  OT End of Session - 02/25/18 1113    Visit Number  79    Number of Visits  96    Date for OT Re-Evaluation  05/20/18    Authorization Type  Medicare 10of 10, reporting period starting 01/12/2018    OT Start Time  1104    OT Stop Time  1145    OT Time Calculation (min)  41 min    Activity Tolerance  Patient tolerated treatment well    Behavior During Therapy  [facility] for tasks assessed/performed       Past Medical History:  Diagnosis Date  . Glaucoma   . Hepatitis C   . Stroke [facility])     Past Surgical History:  Procedure Laterality Date  . IR ANGIO INTRA EXTRACRAN SEL COM CAROTID INNOMINATE UNI L MOD SED  11/25/2016  . IR ANGIO VERTEBRAL SEL SUBCLAVIAN INNOMINATE UNI R MOD SED  11/25/2016  . IR ANGIO VERTEBRAL SEL VERTEBRAL UNI L MOD SED  11/25/2016  . IR INTRAVSC STENT CERV CAROTID W/O EMB-PROT MOD SED INC ANGIO  11/25/2016  . IR PERCUTANEOUS ART THROMBECTOMY/INFUSION INTRACRANIAL INC DIAG ANGIO  11/25/2016  . IR RADIOLOGIST EVAL & MGMT  02/13/2017  . RADIOLOGY WITH ANESTHESIA N/A 11/25/2016   Procedure: RADIOLOGY WITH ANESTHESIA;  Surgeon: Radiologist, Medication, MD;  Location: [facility] OR;  Service: Radiology;  Laterality: N/A;    There were no vitals filed for this visit.  Subjective Assessment - 02/25/18 1330    Subjective   Pt. returned from his vacation, and reported doing alot of walking.    Patient is accompained by:  Family member    Pertinent History  Pt. is a 72 y.o. 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 Pt. was transferred to [facility]. He underwent CTA brain showed short segment near occlusion of proximal R-ICA with  associated intraluminal thrombus --likely due to dissection and emergent large vessel occlusion with right M2 occlusion and evolving right frontal lobe infarct.  He underwent cerebral angio with  complete revascularization of occluded superior division of R-MCA and near complete revascularization of proximal R-ICA with stent assisted angioplasty.  Stroke felt to be embolic in setting of ruptured plaque R-ICA and he was placed on ASA and brillinta due to stent. Pt. was transferred to inpatient rehab for several weeks, had home health therapy upon discharge, and now is ready for outpatient therapy services.    Limitations  Dominant LUE functioning, left hand edema, distal ROM, coordination, and overal strength.    Patient Stated Goals  To regain the use of his LUE.    Currently in Pain?  No/denies         [facility] OT Assessment - 02/25/18 1120      Hand Function   Left Hand Grip (lbs)  37#    Left Hand Lateral Pinch  14 lbs    Left 3 point pinch  11 lbs      OT TREATMENT    Measurements were obtained, and goals were reviewed, and modified with the pt.  Neuro muscular re-education:  Pt. worked on [facility] skills, grasping, and manipulating, separating, and moving small 1/2" magnet pegs through his hand from his palm  to the tip of his 2nd digit and thumb. Pt. has difficulty with moving the objects through his hand.                         OT Education - 02/25/18 1113    Education provided  Yes    Education Details  UE functioning    Person(s) Educated  Patient    Methods  Explanation    Comprehension  Verbalized understanding;Returned demonstration         OT Long Term Goals - 02/25/18 1546      OT LONG TERM GOAL #3   Title  Pt. left hand grip with  increase by 5# in preparation for opening a jar.    Baseline  02/25/2018: Left grip strength has improved by 12#. Pt. continues to have difficultyusing his left hand to open containers.      Time  12    Period  Weeks     Status  On-going    Target Date  05/20/18      OT LONG TERM GOAL #6   Title  Pt. will improve left hand coordination skills to be able to button clothing.    Baseline  02/25/2018: Pt. has improved with left hand coordination skills by 7 sec. of speed. Pt. continues to have difficulty with buttoning.    Time  12    Period  Weeks    Status  On-going    Target Date  05/20/18      OT LONG TERM GOAL #8   Title  Pt. will increase Left pinch strength to be able to grasp and hold items during ADL tasks.    Baseline  02/25/2018: Pt. continues to progress with  pinch strength, and ability to grasp, howeevr has difficulty holding objects.    Time  12    Period  Weeks    Status  On-going    Target Date  05/20/18      OT LONG TERM GOAL  #9   Baseline  Pt. will write one sentence with 75% legibility and efficiently while maintaing grasp on pen 100% of the time.    Time  12    Period  Weeks    Status  On-going    Target Date  05/20/18      OT LONG TERM GOAL  #10   TITLE  Pt. will independently use his left hand to grasp and efficiently accomodate to the varying weights of ADL objects during self care tasks.     Baseline  02/26/2019: Pt. continues to work on grasping and adjusting hand to the varying weights of objects.    Time  12    Period  Weeks    Status  On-going    Target Date  05/20/18      OT LONG TERM GOAL  #11   TITLE  Pt. will improve [facility] skills to be able to independently manipulate items for ADLs/IADLs when UEs are elevated when reaching into the closet, or microwave.    Baseline  02/25/2018: Pt. is progressing however continues to have difficulty manipulating objects when UEs elevated     Time  12    Period  Weeks    Status  On-going    Target Date  05/20/18                   Plan - 02/25/18 1116    Clinical Impression Statement Pt. is making progress overall with  his LUE strength, and [facility] skills. Pt. continues to present with limited left hand grip strength,  pinch strength, hand function and [facility] skills. Pt. continues to have difficulty with translatory movements of the left dominant hand, thumb opposition movement patterns, and [facility] skills needed for ADL/IADL tasks including: buttoning, opening containers and writing. Pt. continues to work on improving LUE functioning for improved ADLs, and IADLs, writing speed, and legibility. Goals were reviewed, and modified with the pt.     Occupational performance deficits (Please refer to evaluation for details):  ADL's;IADL's    Rehab Potential  Excellent    OT Frequency  2x / week    OT Duration  12 weeks    OT Treatment/Interventions  Self-care/ADL training;Therapeutic exercise;Neuromuscular education;Patient/family education;Energy conservation;Therapeutic activities;DME and/or AE instruction    Clinical Decision Making  Several treatment options, min-mod task modification necessary    Consulted and Agree with Plan of Care  Patient       Patient will benefit from skilled therapeutic intervention in order to improve the following deficits and impairments:  Abnormal gait, Pain, Decreased strength, Decreased range of motion, Impaired UE functional use, Decreased coordination, Decreased knowledge of use of DME, Decreased activity tolerance, Difficulty walking, Decreased balance, Other (comment)  Visit Diagnosis: Muscle weakness (generalized) - Plan: Ot plan of care cert/re-cert  Other lack of coordination - Plan: Ot plan of care cert/re-cert    Problem List Patient Active Problem List   Diagnosis Date Noted  . History of stroke 02/24/2018  . Pure hypercholesterolemia 05/28/2017  . Erectile dysfunction 05/28/2017  . Left hemiparesis (HCC)   . Essential hypertension 11/29/2016  . 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 Hepatitis C 07/22/2013    Olegario Messier, MS, OTR/L 02/25/2018, 3:49  PM  [facility] MAIN [facility] SERVICES [address] [place], [place], [address] Phone: [phone]   Fax:  [phone]  Name: Jason Nolan MRN: [id number] Date of Birth: 1946-03-21

## 2018-02-25 NOTE — Progress Notes (Signed)
I agree with the above plan 

## 2018-02-25 NOTE — Addendum Note (Signed)
Addended by: Avon Gully on: 02/25/2018 04:28 PM   Modules accepted: Orders

## 2018-03-02 ENCOUNTER — Encounter: Payer: Self-pay | Admitting: Occupational Therapy

## 2018-03-02 ENCOUNTER — Ambulatory Visit: Payer: Medicare Other | Admitting: Occupational Therapy

## 2018-03-02 DIAGNOSIS — M6281 Muscle weakness (generalized): Secondary | ICD-10-CM | POA: Diagnosis not present

## 2018-03-02 DIAGNOSIS — R278 Other lack of coordination: Secondary | ICD-10-CM | POA: Diagnosis not present

## 2018-03-02 NOTE — Therapy (Signed)
[facility] MAIN [facility] SERVICES [address] [place], [place], [address] Phone: [phone]   Fax:  [phone]  Occupational Therapy Treatment  Patient Details  Name: Jason Nolan MRN: [id number] Date of Birth: [birth year] No data recorded  Encounter Date: 03/02/2018  OT End of Session - 03/02/18 1112    Visit Number  80    Number of Visits  96    Date for OT Re-Evaluation  05/20/18    Authorization Type  Medicare 1 of 10, reporting period starting 03/02/2018    OT Start Time  1100    OT Stop Time  1145    OT Time Calculation (min)  45 min    Activity Tolerance  Patient tolerated treatment well    Behavior During Therapy  [facility] for tasks assessed/performed       Past Medical History:  Diagnosis Date  . Glaucoma   . Hepatitis C   . Stroke [facility])     Past Surgical History:  Procedure Laterality Date  . IR ANGIO INTRA EXTRACRAN SEL COM CAROTID INNOMINATE UNI L MOD SED  11/25/2016  . IR ANGIO VERTEBRAL SEL SUBCLAVIAN INNOMINATE UNI R MOD SED  11/25/2016  . IR ANGIO VERTEBRAL SEL VERTEBRAL UNI L MOD SED  11/25/2016  . IR INTRAVSC STENT CERV CAROTID W/O EMB-PROT MOD SED INC ANGIO  11/25/2016  . IR PERCUTANEOUS ART THROMBECTOMY/INFUSION INTRACRANIAL INC DIAG ANGIO  11/25/2016  . IR RADIOLOGIST EVAL & MGMT  02/13/2017  . RADIOLOGY WITH ANESTHESIA N/A 11/25/2016   Procedure: RADIOLOGY WITH ANESTHESIA;  Surgeon: Radiologist, Medication, MD;  Location: [facility] OR;  Service: Radiology;  Laterality: N/A;    There were no vitals filed for this visit.  Subjective Assessment - 03/02/18 1110    Subjective   Pt. reports he palns to play golf today.    Patient is accompained by:  Family member    Pertinent History  Pt. is a 72 y.o. 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 Pt. was transferred to [facility]. He underwent CTA brain showed short segment near occlusion of proximal R-ICA with associated intraluminal thrombus --likely due to dissection  and emergent large vessel occlusion with right M2 occlusion and evolving right frontal lobe infarct.  He underwent cerebral angio with  complete revascularization of occluded superior division of R-MCA and near complete revascularization of proximal R-ICA with stent assisted angioplasty.  Stroke felt to be embolic in setting of ruptured plaque R-ICA and he was placed on ASA and brillinta due to stent. Pt. was transferred to inpatient rehab for several weeks, had home health therapy upon discharge, and now is ready for outpatient therapy services.    Limitations  Dominant LUE functioning, left hand edema, distal ROM, coordination, and overal strength.    Patient Stated Goals  To regain the use of his LUE.    Currently in Pain?  No/denies       OT TREATMENT    Neuro muscular re-education:  Pt. performed [facility] skills training to improve speed and dexterity needed for ADL tasks and writing. Pt. demonstrated grasping 1" inch sticks. Pt. worked on [facility] skills, thumb opposition skills, grasping 1/4" beads, storing the objects, and translatory skills moving the pegs through his left hand to the tip of his 2nd digit, and thumb. Pt. presented with difficulty grasping objects, manipulating objects, thumb movements, and translatory movements of the hand.  OT Education - 03/02/18 1111    Education provided  Yes    Education Details  UE functioning    Person(s) Educated  Patient    Methods  Explanation    Comprehension  Verbalized understanding;Returned demonstration          OT Long Term Goals - 02/25/18 1546      OT LONG TERM GOAL #3   Title  Pt. left hand grip with  increase by 5# in preparation for opening a jar.    Baseline  02/25/2018: Left grip strength has improved by 12#. Pt. continues to have difficultyusing his left hand to open containers.      Time  12    Period  Weeks    Status  On-going    Target Date  05/20/18      OT LONG TERM GOAL #6    Title  Pt. will improve left hand coordination skills to be able to button clothing.    Baseline  02/25/2018: Pt. has improved with left hand coordination skills by 7 sec. of speed. Pt. continues to have difficulty with buttoning.    Time  12    Period  Weeks    Status  On-going    Target Date  05/20/18      OT LONG TERM GOAL #8   Title  Pt. will increase Left pinch strength to be able to grasp and hold items during ADL tasks.    Baseline  02/25/2018: Pt. continues to progress with  pinch strength, and ability to grasp, howeevr has difficulty holding objects.    Time  12    Period  Weeks    Status  On-going    Target Date  05/20/18      OT LONG TERM GOAL  #9   Baseline  Pt. will write one sentence with 75% legibility and efficiently while maintaing grasp on pen 100% of the time.    Time  12    Period  Weeks    Status  On-going    Target Date  05/20/18      OT LONG TERM GOAL  #10   TITLE  Pt. will independently use his left hand to grasp and efficiently accomodate to the varying weights of ADL objects during self care tasks.     Baseline  02/26/2019: Pt. continues to work on grasping and adjusting hand to the varying weights of objects.    Time  12    Period  Weeks    Status  On-going    Target Date  05/20/18      OT LONG TERM GOAL  #11   TITLE  Pt. will improve [facility] skills to be able to independently manipulate items for ADLs/IADLs when UEs are elevated when reaching into the closet, or microwave.    Baseline  02/25/2018: Pt. is progressing however continues to have difficulty manipulating objects when UEs elevated     Time  12    Period  Weeks    Status  On-going    Target Date  05/20/18            Plan - 03/02/18 1112    Clinical Impression Statement Pt. continues to present with limited LUE [facility] skills, and translatory movements of the hand. Pt. continues to have difficulty manipulating small objects, and performing the thumb movements needed to move the objects through  his hand to the tip of his 2nd digit, and thumb. Pt. continues to work on improving thumb movement patterns  to complete translatory movements. Pt. required the objects to be graded larger. Pt.'s left hand is more stiff today, and has numbness through his hand     Occupational performance deficits (Please refer to evaluation for details):  ADL's;IADL's    Rehab Potential  Excellent    OT Frequency  2x / week    OT Duration  12 weeks    OT Treatment/Interventions  Self-care/ADL training;Therapeutic exercise;Neuromuscular education;Patient/family education;Energy conservation;Therapeutic activities;DME and/or AE instruction    Clinical Decision Making  Several treatment options, min-mod task modification necessary    Consulted and Agree with Plan of Care  Patient       Patient will benefit from skilled therapeutic intervention in order to improve the following deficits and impairments:  Abnormal gait, Pain, Decreased strength, Decreased range of motion, Impaired UE functional use, Decreased coordination, Decreased knowledge of use of DME, Decreased activity tolerance, Difficulty walking, Decreased balance, Other (comment)  Visit Diagnosis: Muscle weakness (generalized)  Other lack of coordination    Problem List Patient Active Problem List   Diagnosis Date Noted  . History of stroke 02/24/2018  . Pure hypercholesterolemia 05/28/2017  . Erectile dysfunction 05/28/2017  . Left hemiparesis (HCC)   . Essential hypertension 11/29/2016  . 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 Hepatitis C 07/22/2013    Olegario Messier, MS, OTR/L 03/02/2018, 11:32 AM  [place] [facility] MAIN [facility] SERVICES [address] [place], [place], [address] Phone: [phone]   Fax:  [phone]  Name: Jason Nolan MRN: [id number] Date of Birth: 08-02-1945

## 2018-03-04 ENCOUNTER — Ambulatory Visit: Payer: Medicare Other | Admitting: Occupational Therapy

## 2018-03-04 ENCOUNTER — Encounter: Payer: Self-pay | Admitting: Occupational Therapy

## 2018-03-04 DIAGNOSIS — R278 Other lack of coordination: Secondary | ICD-10-CM | POA: Diagnosis not present

## 2018-03-04 DIAGNOSIS — M6281 Muscle weakness (generalized): Secondary | ICD-10-CM | POA: Diagnosis not present

## 2018-03-04 NOTE — Therapy (Signed)
[place] [facility] MAIN [facility] SERVICES [address] [place], [place], [address] Phone: [phone]   Fax:  [phone]  Occupational Therapy Treatment  Patient Details  Name: Jason Nolan MRN: [id number] Date of Birth: 18-May-1945 No data recorded  Encounter Date: 03/04/2018  OT End of Session - 03/04/18 1122    Visit Number  81    Number of Visits  96    Date for OT Re-Evaluation  05/20/18    Authorization Type  Medicare 2 of 10, reporting period starting 03/02/2018    OT Start Time  1100    OT Stop Time  1145    OT Time Calculation (min)  45 min    Activity Tolerance  Patient tolerated treatment well    Behavior During Therapy  [facility] for tasks assessed/performed       Past Medical History:  Diagnosis Date  . Glaucoma   . Hepatitis C   . Stroke [facility])     Past Surgical History:  Procedure Laterality Date  . IR ANGIO INTRA EXTRACRAN SEL COM CAROTID INNOMINATE UNI L MOD SED  11/25/2016  . IR ANGIO VERTEBRAL SEL SUBCLAVIAN INNOMINATE UNI R MOD SED  11/25/2016  . IR ANGIO VERTEBRAL SEL VERTEBRAL UNI L MOD SED  11/25/2016  . IR INTRAVSC STENT CERV CAROTID W/O EMB-PROT MOD SED INC ANGIO  11/25/2016  . IR PERCUTANEOUS ART THROMBECTOMY/INFUSION INTRACRANIAL INC DIAG ANGIO  11/25/2016  . IR RADIOLOGIST EVAL & MGMT  02/13/2017  . RADIOLOGY WITH ANESTHESIA N/A 11/25/2016   Procedure: RADIOLOGY WITH ANESTHESIA;  Surgeon: Radiologist, Medication, MD;  Location: [facility] OR;  Service: Radiology;  Laterality: N/A;    There were no vitals filed for this visit.  Subjective Assessment - 03/04/18 1115    Subjective   Pt. reports that he rested his LUE yesterday.    Patient is accompained by:  Family member    Pertinent History  Pt. is a 72 y.o. 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 Pt. was transferred to [facility]. He underwent CTA brain showed short segment near occlusion of proximal R-ICA with associated intraluminal thrombus --likely due to  dissection and emergent large vessel occlusion with right M2 occlusion and evolving right frontal lobe infarct.  He underwent cerebral angio with  complete revascularization of occluded superior division of R-MCA and near complete revascularization of proximal R-ICA with stent assisted angioplasty.  Stroke felt to be embolic in setting of ruptured plaque R-ICA and he was placed on ASA and brillinta due to stent. Pt. was transferred to inpatient rehab for several weeks, had home health therapy upon discharge, and now is ready for outpatient therapy services.    Limitations  Dominant LUE functioning, left hand edema, distal ROM, coordination, and overal strength.    Currently in Pain?  No/denies    Pain Score  0-No pain      OT TREATMENT    Neuro muscular re-education:  Pt. Worked on grasping, and manipulating 1/2" flat marbles., and worked on translatory movements moving objects through the palm to the tip of his 2nd digit through thumb. Pt. performed [facility] tasks using the Grooved pegboard. Pt. worked on grasping the grooved pegs from a horizontal position, and moving the pegs to a vertical position in the hand to prepare for placing them in the grooved slot.                         OT Education - 03/04/18  1121    Education provided  Yes    Education Details  UE functioning    Person(s) Educated  Patient    Methods  Explanation    Comprehension  Verbalized understanding;Returned demonstration          OT Long Term Goals - 02/25/18 1546      OT LONG TERM GOAL #3   Title  Pt. left hand grip with  increase by 5# in preparation for opening a jar.    Baseline  02/25/2018: Left grip strength has improved by 12#. Pt. continues to have difficultyusing his left hand to open containers.      Time  12    Period  Weeks    Status  On-going    Target Date  05/20/18      OT LONG TERM GOAL #6   Title  Pt. will improve left hand coordination skills to be able to button clothing.     Baseline  02/25/2018: Pt. has improved with left hand coordination skills by 7 sec. of speed. Pt. continues to have difficulty with buttoning.    Time  12    Period  Weeks    Status  On-going    Target Date  05/20/18      OT LONG TERM GOAL #8   Title  Pt. will increase Left pinch strength to be able to grasp and hold items during ADL tasks.    Baseline  02/25/2018: Pt. continues to progress with  pinch strength, and ability to grasp, howeevr has difficulty holding objects.    Time  12    Period  Weeks    Status  On-going    Target Date  05/20/18      OT LONG TERM GOAL  #9   Baseline  Pt. will write one sentence with 75% legibility and efficiently while maintaing grasp on pen 100% of the time.    Time  12    Period  Weeks    Status  On-going    Target Date  05/20/18      OT LONG TERM GOAL  #10   TITLE  Pt. will independently use his left hand to grasp and efficiently accomodate to the varying weights of ADL objects during self care tasks.     Baseline  02/26/2019: Pt. continues to work on grasping and adjusting hand to the varying weights of objects.    Time  12    Period  Weeks    Status  On-going    Target Date  05/20/18      OT LONG TERM GOAL  #11   TITLE  Pt. will improve [facility] skills to be able to independently manipulate items for ADLs/IADLs when UEs are elevated when reaching into the closet, or microwave.    Baseline  02/25/2018: Pt. is progressing however continues to have difficulty manipulating objects when UEs elevated     Time  12    Period  Weeks    Status  On-going    Target Date  05/20/18            Plan - 03/04/18 1123    Clinical Impression Statement  Pt. continues to present with limited [facility] skills, translatory movements of the hand, and thumb movements. Pt. is able to perform translatory movements with larger objects. Pt. conitnues to present with difficulty moving smaller objects through his hand. Pt. continues to work on improivng UE functioning for  improved ADL, and IADL skills.    Occupational performance  deficits (Please refer to evaluation for details):  ADL's;IADL's    Rehab Potential  Excellent    OT Frequency  2x / week    OT Duration  12 weeks    OT Treatment/Interventions  Self-care/ADL training;Therapeutic exercise;Neuromuscular education;Patient/family education;Energy conservation;Therapeutic activities;DME and/or AE instruction    Clinical Decision Making  Several treatment options, min-mod task modification necessary    Consulted and Agree with Plan of Care  Patient       Patient will benefit from skilled therapeutic intervention in order to improve the following deficits and impairments:  Abnormal gait, Pain, Decreased strength, Decreased range of motion, Impaired UE functional use, Decreased coordination, Decreased knowledge of use of DME, Decreased activity tolerance, Difficulty walking, Decreased balance, Other (comment)  Visit Diagnosis: Muscle weakness (generalized)  Other lack of coordination    Problem List Patient Active Problem List   Diagnosis Date Noted  . History of stroke 02/24/2018  . Pure hypercholesterolemia 05/28/2017  . Erectile dysfunction 05/28/2017  . Left hemiparesis (HCC)   . Essential hypertension 11/29/2016  . 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 Hepatitis C 07/22/2013    Olegario Messier, MS, OTR/L 03/04/2018, 11:47 AM  [place] [facility] [facility] MAIN [facility] SERVICES [address] [place], [place], [address] Phone: [phone]   Fax:  [phone]  Name: Jason Nolan MRN: [id number] Date of Birth: [birth year]

## 2018-03-09 ENCOUNTER — Ambulatory Visit: Payer: Medicare Other | Attending: Internal Medicine | Admitting: Occupational Therapy

## 2018-03-09 DIAGNOSIS — M6281 Muscle weakness (generalized): Secondary | ICD-10-CM | POA: Diagnosis not present

## 2018-03-09 DIAGNOSIS — R278 Other lack of coordination: Secondary | ICD-10-CM | POA: Insufficient documentation

## 2018-03-09 NOTE — Therapy (Signed)
[facility] MAIN [facility] SERVICES [address] [place], [place], [address] Phone: [phone]   Fax:  [phone]  Occupational Therapy Treatment  Patient Details  Name: Jason Nolan MRN: [id number] Date of Birth: 1945-06-01 No data recorded  Encounter Date: 03/09/2018  OT End of Session - 03/09/18 0850    Visit Number  82    Number of Visits  96    Date for OT Re-Evaluation  05/20/18    Authorization Type  Medicare 3 of 10, reporting period starting 03/02/2018    OT Start Time  0830    OT Stop Time  0915    OT Time Calculation (min)  45 min    Activity Tolerance  Patient tolerated treatment well    Behavior During Therapy  [facility] for tasks assessed/performed       Past Medical History:  Diagnosis Date  . Glaucoma   . Hepatitis C   . Stroke [facility])     Past Surgical History:  Procedure Laterality Date  . IR ANGIO INTRA EXTRACRAN SEL COM CAROTID INNOMINATE UNI L MOD SED  11/25/2016  . IR ANGIO VERTEBRAL SEL SUBCLAVIAN INNOMINATE UNI R MOD SED  11/25/2016  . IR ANGIO VERTEBRAL SEL VERTEBRAL UNI L MOD SED  11/25/2016  . IR INTRAVSC STENT CERV CAROTID W/O EMB-PROT MOD SED INC ANGIO  11/25/2016  . IR PERCUTANEOUS ART THROMBECTOMY/INFUSION INTRACRANIAL INC DIAG ANGIO  11/25/2016  . IR RADIOLOGIST EVAL & MGMT  02/13/2017  . RADIOLOGY WITH ANESTHESIA N/A 11/25/2016   Procedure: RADIOLOGY WITH ANESTHESIA;  Surgeon: Radiologist, Medication, MD;  Location: [facility] OR;  Service: Radiology;  Laterality: N/A;    There were no vitals filed for this visit.  Subjective Assessment - 03/09/18 0838    Subjective   Pt. reports that he tried to use theraband this weekend.    Patient is accompained by:  Family member    Pertinent History  Pt. is a 72 y.o. 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 Pt. was transferred to [facility]. He underwent CTA brain showed short segment near occlusion of proximal R-ICA with associated intraluminal thrombus --likely due  to dissection and emergent large vessel occlusion with right M2 occlusion and evolving right frontal lobe infarct.  He underwent cerebral angio with  complete revascularization of occluded superior division of R-MCA and near complete revascularization of proximal R-ICA with stent assisted angioplasty.  Stroke felt to be embolic in setting of ruptured plaque R-ICA and he was placed on ASA and brillinta due to stent. Pt. was transferred to inpatient rehab for several weeks, had home health therapy upon discharge, and now is ready for outpatient therapy services.    Currently in Pain?  No/denies    Multiple Pain Sites  No      OT TREATMENT    Neuro muscular re-education:  Pt. worked on grasping and manipulating 1/2" washers his left hand. Pt. worked on translatory movements moving the washers through his hand from the palm to the tip of his 2nd digit, and thumb. Pt. worked on placing the washers over vertical, and diagonal dowels. Pt. required verbal cues to to avoid compensating proximally. Pt. Worked on bilateral [facility] skills manipulating, and untying knots.                       OT Education - 03/09/18 0850    Education provided  Yes    Education Details  UE functioning    Person(s)  Educated  Patient    Methods  Explanation    Comprehension  Verbalized understanding;Returned demonstration          OT Long Term Goals - 02/25/18 1546      OT LONG TERM GOAL #3   Title  Pt. left hand grip with  increase by 5# in preparation for opening a jar.    Baseline  02/25/2018: Left grip strength has improved by 12#. Pt. continues to have difficultyusing his left hand to open containers.      Time  12    Period  Weeks    Status  On-going    Target Date  05/20/18      OT LONG TERM GOAL #6   Title  Pt. will improve left hand coordination skills to be able to button clothing.    Baseline  02/25/2018: Pt. has improved with left hand coordination skills by 7 sec. of speed. Pt.  continues to have difficulty with buttoning.    Time  12    Period  Weeks    Status  On-going    Target Date  05/20/18      OT LONG TERM GOAL #8   Title  Pt. will increase Left pinch strength to be able to grasp and hold items during ADL tasks.    Baseline  02/25/2018: Pt. continues to progress with  pinch strength, and ability to grasp, howeevr has difficulty holding objects.    Time  12    Period  Weeks    Status  On-going    Target Date  05/20/18      OT LONG TERM GOAL  #9   Baseline  Pt. will write one sentence with 75% legibility and efficiently while maintaing grasp on pen 100% of the time.    Time  12    Period  Weeks    Status  On-going    Target Date  05/20/18      OT LONG TERM GOAL  #10   TITLE  Pt. will independently use his left hand to grasp and efficiently accomodate to the varying weights of ADL objects during self care tasks.     Baseline  02/26/2019: Pt. continues to work on grasping and adjusting hand to the varying weights of objects.    Time  12    Period  Weeks    Status  On-going    Target Date  05/20/18      OT LONG TERM GOAL  #11   TITLE  Pt. will improve [facility] skills to be able to independently manipulate items for ADLs/IADLs when UEs are elevated when reaching into the closet, or microwave.    Baseline  02/25/2018: Pt. is progressing however continues to have difficulty manipulating objects when UEs elevated     Time  12    Period  Weeks    Status  On-going    Target Date  05/20/18            Plan - 03/09/18 1610    Clinical Impression Statement  Pt. continues to present with limited [facility] skills, hand function skills, and thumb opposition. Pt. has difficulty manipulating objects with his left hand, and moving the objects through his hand to the tip of his 2nd digit, and thumb. Pt. [facility] skills are improving. Pt. continues to work on improving LUE, hand strength, and [facility] skills.     Occupational performance deficits (Please refer to evaluation for  details):  ADL's;IADL's    Rehab Potential  Excellent    OT Frequency  2x / week    OT Duration  12 weeks    OT Treatment/Interventions  Self-care/ADL training;Therapeutic exercise;Neuromuscular education;Patient/family education;Energy conservation;Therapeutic activities;DME and/or AE instruction    Clinical Decision Making  Several treatment options, min-mod task modification necessary    Consulted and Agree with Plan of Care  Patient       Patient will benefit from skilled therapeutic intervention in order to improve the following deficits and impairments:  Abnormal gait, Pain, Decreased strength, Decreased range of motion, Impaired UE functional use, Decreased coordination, Decreased knowledge of use of DME, Decreased activity tolerance, Difficulty walking, Decreased balance, Other (comment)  Visit Diagnosis: Muscle weakness (generalized)    Problem List Patient Active Problem List   Diagnosis Date Noted  . History of stroke 02/24/2018  . Pure hypercholesterolemia 05/28/2017  . Erectile dysfunction 05/28/2017  . Left hemiparesis (HCC)   . Essential hypertension 11/29/2016  . 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 Hepatitis C 07/22/2013    Olegario Messier 03/09/2018, 9:29 AM  [place] MAIN [facility] SERVICES [address] [place], [place], [address] Phone: [phone]   Fax:  [phone]  Name: Tiara Bartoli MRN: [id number] Date of Birth: 24-Nov-1945

## 2018-03-11 ENCOUNTER — Ambulatory Visit: Payer: Medicare Other | Admitting: Occupational Therapy

## 2018-03-11 ENCOUNTER — Encounter: Payer: Self-pay | Admitting: Occupational Therapy

## 2018-03-11 DIAGNOSIS — R278 Other lack of coordination: Secondary | ICD-10-CM

## 2018-03-11 DIAGNOSIS — M6281 Muscle weakness (generalized): Secondary | ICD-10-CM | POA: Diagnosis not present

## 2018-03-11 NOTE — Therapy (Signed)
[place] [facility] MAIN [facility] SERVICES [address] [place], [place], [address] Phone: [phone]   Fax:  [phone]  Occupational Therapy Treatment  Patient Details  Name: Jason Nolan MRN: [id number] Date of Birth: Apr 28, 1946 No data recorded  Encounter Date: 03/11/2018  OT End of Session - 03/11/18 0958    Visit Number  83    Number of Visits  96    Date for OT Re-Evaluation  05/20/18    Authorization Type  Medicare 4 of 10, reporting period starting 03/02/2018    Activity Tolerance  Patient tolerated treatment well    Behavior During Therapy  [facility] for tasks assessed/performed       Past Medical History:  Diagnosis Date  . Glaucoma   . Hepatitis C   . Stroke [facility])     Past Surgical History:  Procedure Laterality Date  . IR ANGIO INTRA EXTRACRAN SEL COM CAROTID INNOMINATE UNI L MOD SED  11/25/2016  . IR ANGIO VERTEBRAL SEL SUBCLAVIAN INNOMINATE UNI R MOD SED  11/25/2016  . IR ANGIO VERTEBRAL SEL VERTEBRAL UNI L MOD SED  11/25/2016  . IR INTRAVSC STENT CERV CAROTID W/O EMB-PROT MOD SED INC ANGIO  11/25/2016  . IR PERCUTANEOUS ART THROMBECTOMY/INFUSION INTRACRANIAL INC DIAG ANGIO  11/25/2016  . IR RADIOLOGIST EVAL & MGMT  02/13/2017  . RADIOLOGY WITH ANESTHESIA N/A 11/25/2016   Procedure: RADIOLOGY WITH ANESTHESIA;  Surgeon: Radiologist, Medication, MD;  Location: [facility] OR;  Service: Radiology;  Laterality: N/A;    There were no vitals filed for this visit.  Subjective Assessment - 03/11/18 0955    Subjective   Pt. reports that he plans to go to play golf today.    Patient is accompained by:  Family member    Pertinent History  Pt. is a 72 y.o. 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 Pt. was transferred to [facility]. He underwent CTA brain showed short segment near occlusion of proximal R-ICA with associated intraluminal thrombus --likely due to dissection and emergent large vessel occlusion with right M2 occlusion and evolving  right frontal lobe infarct.  He underwent cerebral angio with  complete revascularization of occluded superior division of R-MCA and near complete revascularization of proximal R-ICA with stent assisted angioplasty.  Stroke felt to be embolic in setting of ruptured plaque R-ICA and he was placed on ASA and brillinta due to stent. Pt. was transferred to inpatient rehab for several weeks, had home health therapy upon discharge, and now is ready for outpatient therapy services.    Limitations  Dominant LUE functioning, left hand edema, distal ROM, coordination, and overal strength.    Patient Stated Goals  To regain the use of his LUE.    Currently in Pain?  No/denies      OT TREATMENT    Neuro muscular re-education:  Pt. worked on grasping 1" resistive cubes alternating thumb opposition to the tip of the 2nd digit, and thumb while the board is placed at a vertical angle. Pt. worked on pressing the cubes back into place while alternating isolated 2nd digit extension.Pt. Requires verbal cues to avoid compensating proximally.  Pt. Worked on [facility] skills, grasping 1/4" small pegs, moving them through his hand from the palm to the tip of his 2nd digit, and thumb in preparation for placing them in a pegboard.                        OT Education - 03/11/18  1610    Education provided  Yes    Education Details  UE functioning    Person(s) Educated  Patient    Methods  Explanation    Comprehension  Verbalized understanding;Returned demonstration          OT Long Term Goals - 02/25/18 1546      OT LONG TERM GOAL #3   Title  Pt. left hand grip with  increase by 5# in preparation for opening a jar.    Baseline  02/25/2018: Left grip strength has improved by 12#. Pt. continues to have difficultyusing his left hand to open containers.      Time  12    Period  Weeks    Status  On-going    Target Date  05/20/18      OT LONG TERM GOAL #6   Title  Pt. will improve left hand  coordination skills to be able to button clothing.    Baseline  02/25/2018: Pt. has improved with left hand coordination skills by 7 sec. of speed. Pt. continues to have difficulty with buttoning.    Time  12    Period  Weeks    Status  On-going    Target Date  05/20/18      OT LONG TERM GOAL #8   Title  Pt. will increase Left pinch strength to be able to grasp and hold items during ADL tasks.    Baseline  02/25/2018: Pt. continues to progress with  pinch strength, and ability to grasp, howeevr has difficulty holding objects.    Time  12    Period  Weeks    Status  On-going    Target Date  05/20/18      OT LONG TERM GOAL  #9   Baseline  Pt. will write one sentence with 75% legibility and efficiently while maintaing grasp on pen 100% of the time.    Time  12    Period  Weeks    Status  On-going    Target Date  05/20/18      OT LONG TERM GOAL  #10   TITLE  Pt. will independently use his left hand to grasp and efficiently accomodate to the varying weights of ADL objects during self care tasks.     Baseline  02/26/2019: Pt. continues to work on grasping and adjusting hand to the varying weights of objects.    Time  12    Period  Weeks    Status  On-going    Target Date  05/20/18      OT LONG TERM GOAL  #11   TITLE  Pt. will improve [facility] skills to be able to independently manipulate items for ADLs/IADLs when UEs are elevated when reaching into the closet, or microwave.    Baseline  02/25/2018: Pt. is progressing however continues to have difficulty manipulating objects when UEs elevated     Time  12    Period  Weeks    Status  On-going    Target Date  05/20/18            Plan - 03/11/18 0959    Clinical Impression Statement  Pt. reports that he is using his left hand at home. Pt. continues to work on improving left hand functioning. Pt. continues to work on improving translatory movements of the hand. Pt. continues to have difficulty with moving objects through his left hand  to the tip of his 2nd digit, and thumb. Pt. requires cues to avoid  compensating proximally with his left elbow. Pt. continues to work on improving hand function, and [facility] skills for improved engagement in ADLs, and IADL tasks.      Occupational performance deficits (Please refer to evaluation for details):  ADL's;IADL's    Rehab Potential  Excellent    OT Frequency  2x / week    OT Duration  12 weeks    OT Treatment/Interventions  Self-care/ADL training;Therapeutic exercise;Neuromuscular education;Patient/family education;Energy conservation;Therapeutic activities;DME and/or AE instruction    Clinical Decision Making  Several treatment options, min-mod task modification necessary    Consulted and Agree with Plan of Care  Patient       Patient will benefit from skilled therapeutic intervention in order to improve the following deficits and impairments:  Abnormal gait, Pain, Decreased strength, Decreased range of motion, Impaired UE functional use, Decreased coordination, Decreased knowledge of use of DME, Decreased activity tolerance, Difficulty walking, Decreased balance, Other (comment)  Visit Diagnosis: Muscle weakness (generalized)  Other lack of coordination    Problem List Patient Active Problem List   Diagnosis Date Noted  . History of stroke 02/24/2018  . Pure hypercholesterolemia 05/28/2017  . Erectile dysfunction 05/28/2017  . Left hemiparesis (HCC)   . Essential hypertension 11/29/2016  . 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 Hepatitis C 07/22/2013    Olegario Messier, MS, OTR/L 03/11/2018, 10:46 AM  [place] [facility] MAIN [facility] SERVICES [address] [place], [place], [address] Phone: [phone]   Fax:  [phone]  Name: Zaion Hreha MRN: [id number] Date of Birth: February 05, 1946

## 2018-03-16 ENCOUNTER — Ambulatory Visit: Payer: Medicare Other | Admitting: Gastroenterology

## 2018-03-16 ENCOUNTER — Encounter: Payer: Self-pay | Admitting: Internal Medicine

## 2018-03-17 ENCOUNTER — Encounter: Payer: Self-pay | Admitting: Occupational Therapy

## 2018-03-17 ENCOUNTER — Ambulatory Visit: Payer: Medicare Other | Admitting: Occupational Therapy

## 2018-03-17 DIAGNOSIS — R278 Other lack of coordination: Secondary | ICD-10-CM

## 2018-03-17 DIAGNOSIS — M6281 Muscle weakness (generalized): Secondary | ICD-10-CM

## 2018-03-17 NOTE — Therapy (Signed)
[facility] MAIN [facility] SERVICES [address] [place], [place], [address] Phone: [phone]   Fax:  [phone]  Occupational Therapy Treatment  Patient Details  Name: Jason Nolan MRN: [id number] Date of Birth: [birth year] No data recorded  Encounter Date: 03/17/2018  OT End of Session - 03/17/18 0943    Visit Number  84    Number of Visits  96    Date for OT Re-Evaluation  05/20/18    Authorization Type  Medicare 5 of 10, reporting period starting 03/02/2018    OT Start Time  0930    OT Stop Time  1015    OT Time Calculation (min)  45 min    Activity Tolerance  Patient tolerated treatment well    Behavior During Therapy  [facility] for tasks assessed/performed       Past Medical History:  Diagnosis Date  . Glaucoma   . Hepatitis C   . Stroke [facility])     Past Surgical History:  Procedure Laterality Date  . IR ANGIO INTRA EXTRACRAN SEL COM CAROTID INNOMINATE UNI L MOD SED  11/25/2016  . IR ANGIO VERTEBRAL SEL SUBCLAVIAN INNOMINATE UNI R MOD SED  11/25/2016  . IR ANGIO VERTEBRAL SEL VERTEBRAL UNI L MOD SED  11/25/2016  . IR INTRAVSC STENT CERV CAROTID W/O EMB-PROT MOD SED INC ANGIO  11/25/2016  . IR PERCUTANEOUS ART THROMBECTOMY/INFUSION INTRACRANIAL INC DIAG ANGIO  11/25/2016  . IR RADIOLOGIST EVAL & MGMT  02/13/2017  . RADIOLOGY WITH ANESTHESIA N/A 11/25/2016   Procedure: RADIOLOGY WITH ANESTHESIA;  Surgeon: Radiologist, Medication, MD;  Location: [facility] OR;  Service: Radiology;  Laterality: N/A;    There were no vitals filed for this visit.  Subjective Assessment - 03/17/18 0938    Subjective   Pt. reports that his hand feels like a robot sometimes.    Patient is accompained by:  Family member    Pertinent History  Pt. is a 72 y.o. 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 Pt. was transferred to [facility]. He underwent CTA brain showed short segment near occlusion of proximal R-ICA with associated intraluminal thrombus --likely due  to dissection and emergent large vessel occlusion with right M2 occlusion and evolving right frontal lobe infarct.  He underwent cerebral angio with  complete revascularization of occluded superior division of R-MCA and near complete revascularization of proximal R-ICA with stent assisted angioplasty.  Stroke felt to be embolic in setting of ruptured plaque R-ICA and he was placed on ASA and brillinta due to stent. Pt. was transferred to inpatient rehab for several weeks, had home health therapy upon discharge, and now is ready for outpatient therapy services.    Limitations  Dominant LUE functioning, left hand edema, distal ROM, coordination, and overal strength.    Patient Stated Goals  To regain the use of his LUE.    Currently in Pain?  No/denies      OT TREATMENT    Neuro muscular re-education:  Pt. worked on grasping, and manipulating 1/2" circular marbles. The task was graded to challenge the patient using slick circular marbles.  Pt. worked on grasping, and manipulating 1/4" objects, and the translatory skills needed to move the pegs through his left hand from his palm to the tip of his 2nd digit, and thumb. Pt. required increased time to accurately position the pegs in preparation for accuracy reaching the target on the pegboard.  OT Education - 03/17/18 0941    Education provided  Yes    Education Details  LUE functioning    Person(s) Educated  Patient    Methods  Explanation    Comprehension  Verbalized understanding;Returned demonstration          OT Long Term Goals - 02/25/18 1546      OT LONG TERM GOAL #3   Title  Pt. left hand grip with  increase by 5# in preparation for opening a jar.    Baseline  02/25/2018: Left grip strength has improved by 12#. Pt. continues to have difficultyusing his left hand to open containers.      Time  12    Period  Weeks    Status  On-going    Target Date  05/20/18      OT LONG TERM GOAL #6    Title  Pt. will improve left hand coordination skills to be able to button clothing.    Baseline  02/25/2018: Pt. has improved with left hand coordination skills by 7 sec. of speed. Pt. continues to have difficulty with buttoning.    Time  12    Period  Weeks    Status  On-going    Target Date  05/20/18      OT LONG TERM GOAL #8   Title  Pt. will increase Left pinch strength to be able to grasp and hold items during ADL tasks.    Baseline  02/25/2018: Pt. continues to progress with  pinch strength, and ability to grasp, howeevr has difficulty holding objects.    Time  12    Period  Weeks    Status  On-going    Target Date  05/20/18      OT LONG TERM GOAL  #9   Baseline  Pt. will write one sentence with 75% legibility and efficiently while maintaing grasp on pen 100% of the time.    Time  12    Period  Weeks    Status  On-going    Target Date  05/20/18      OT LONG TERM GOAL  #10   TITLE  Pt. will independently use his left hand to grasp and efficiently accomodate to the varying weights of ADL objects during self care tasks.     Baseline  02/26/2019: Pt. continues to work on grasping and adjusting hand to the varying weights of objects.    Time  12    Period  Weeks    Status  On-going    Target Date  05/20/18      OT LONG TERM GOAL  #11   TITLE  Pt. will improve [facility] skills to be able to independently manipulate items for ADLs/IADLs when UEs are elevated when reaching into the closet, or microwave.    Baseline  02/25/2018: Pt. is progressing however continues to have difficulty manipulating objects when UEs elevated     Time  12    Period  Weeks    Status  On-going    Target Date  05/20/18            Plan - 03/17/18 0944    Clinical Impression Statement  Pt. is making progress with his LUE, and hand function/ pt. is trying to incorporate it during tasks at home. Pt. continues to have difficulty performing translatory movements of the hand moving objects from his palm  distally to the tip of his second digit, and thumb. Pt. worked on grading the task to  challenge circular, and sphere shaped objects. Pt. continues to work on improving LUE functioning in preparation for improved functional engagement during ADLs,, and IADLs.   Occupational performance deficits (Please refer to evaluation for details):  ADL's;IADL's    Rehab Potential  Excellent    OT Frequency  2x / week    OT Duration  12 weeks    OT Treatment/Interventions  Self-care/ADL training;Therapeutic exercise;Neuromuscular education;Patient/family education;Energy conservation;Therapeutic activities;DME and/or AE instruction    Clinical Decision Making  Several treatment options, min-mod task modification necessary    Consulted and Agree with Plan of Care  Patient       Patient will benefit from skilled therapeutic intervention in order to improve the following deficits and impairments:  Abnormal gait, Pain, Decreased strength, Decreased range of motion, Impaired UE functional use, Decreased coordination, Decreased knowledge of use of DME, Decreased activity tolerance, Difficulty walking, Decreased balance, Other (comment)  Visit Diagnosis: Muscle weakness (generalized)  Other lack of coordination    Problem List Patient Active Problem List   Diagnosis Date Noted  . History of stroke 02/24/2018  . Pure hypercholesterolemia 05/28/2017  . Erectile dysfunction 05/28/2017  . Left hemiparesis (HCC)   . Essential hypertension 11/29/2016  . 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 Hepatitis C 07/22/2013    Olegario Messier, MS, OTR/L 03/17/2018, 9:57 AM    [facility] MAIN [facility] SERVICES [address] [place], [place], [address] Phone: [phone]   Fax:  [phone]  Name: Jason Nolan MRN: [id number] Date of Birth: 11/06/1945

## 2018-03-19 ENCOUNTER — Ambulatory Visit: Payer: Medicare Other | Admitting: Occupational Therapy

## 2018-03-19 ENCOUNTER — Encounter: Payer: Self-pay | Admitting: Occupational Therapy

## 2018-03-19 DIAGNOSIS — M6281 Muscle weakness (generalized): Secondary | ICD-10-CM | POA: Diagnosis not present

## 2018-03-19 DIAGNOSIS — R278 Other lack of coordination: Secondary | ICD-10-CM

## 2018-03-19 NOTE — Therapy (Addendum)
[facility] MAIN [facility] SERVICES [address] [place], [place], [address] Phone: [phone]   Fax:  [phone]  Occupational Therapy Treatment  Patient Details  Name: Jason Nolan MRN: [id number] Date of Birth: 1945/05/15 No data recorded  Encounter Date: 03/19/2018  OT End of Session - 03/19/18 0947    Visit Number  85    Number of Visits  96    Date for OT Re-Evaluation  05/20/18    Authorization Type  Medicare 6 of 10, reporting period starting 03/02/2018    OT Start Time  0933    OT Stop Time  1015    OT Time Calculation (min)  42 min    Activity Tolerance  Patient tolerated treatment well    Behavior During Therapy  [facility] for tasks assessed/performed       Past Medical History:  Diagnosis Date  . Glaucoma   . Hepatitis C   . Stroke [facility])     Past Surgical History:  Procedure Laterality Date  . IR ANGIO INTRA EXTRACRAN SEL COM CAROTID INNOMINATE UNI L MOD SED  11/25/2016  . IR ANGIO VERTEBRAL SEL SUBCLAVIAN INNOMINATE UNI R MOD SED  11/25/2016  . IR ANGIO VERTEBRAL SEL VERTEBRAL UNI L MOD SED  11/25/2016  . IR INTRAVSC STENT CERV CAROTID W/O EMB-PROT MOD SED INC ANGIO  11/25/2016  . IR PERCUTANEOUS ART THROMBECTOMY/INFUSION INTRACRANIAL INC DIAG ANGIO  11/25/2016  . IR RADIOLOGIST EVAL & MGMT  02/13/2017  . RADIOLOGY WITH ANESTHESIA N/A 11/25/2016   Procedure: RADIOLOGY WITH ANESTHESIA;  Surgeon: Radiologist, Medication, MD;  Location: [facility] OR;  Service: Radiology;  Laterality: N/A;    There were no vitals filed for this visit.  Subjective Assessment - 03/19/18 0937    Subjective   Pt reports that his hand feels more stiff and less coordinated in the morning.    Patient is accompained by:  Family member    Pertinent History  Pt. is a 72 y.o. 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 Pt. was transferred to [facility]. He underwent CTA brain showed short segment near occlusion of proximal R-ICA with associated intraluminal  thrombus --likely due to dissection and emergent large vessel occlusion with right M2 occlusion and evolving right frontal lobe infarct.  He underwent cerebral angio with  complete revascularization of occluded superior division of R-MCA and near complete revascularization of proximal R-ICA with stent assisted angioplasty.  Stroke felt to be embolic in setting of ruptured plaque R-ICA and he was placed on ASA and brillinta due to stent. Pt. was transferred to inpatient rehab for several weeks, had home health therapy upon discharge, and now is ready for outpatient therapy services.    Limitations  Dominant LUE functioning, left hand edema, distal ROM, coordination, and overal strength.    Patient Stated Goals  To regain the use of his LUE.    Currently in Pain?  No/denies    Pain Score  0-No pain    Multiple Pain Sites  No      OT TREATMENT  Neuromuscular re-education:   Pt. worked on grasping, and positioning magnetic hooks on a whiteboard positioned at a vertical angle on an elevated surface. Pt. worked on [facility] skills grasping 1/2" flat washers, and placing them on the hooks. Pt used translatory movements to move washer through the palm before placing them on the hooks. Pt required x3 short rest breaks as his hand and LUE fatigued while keeping it sustained in elevation  above head. Pt completed left hand [facility] task that required him to thread beads of different sizes onto a lace with resistance. Pt dropped x5 beads during the task and required increased time to target the hole of each bead with the aglet of the lace.    Self-care:   Pt. worked on Careers information officer and speed. Pt. was able to write 2 lines of print and cursive forms of his name using a standard pen with a larger grip and unlined paper. Pt used a mature hand grasp and did not have to reposition pen in his hand. Pt demonstrated appropriate letter spacing and did not significantly deviate from the line nor margins. Pt demonstrated ~90%  legibility for his name in print and ~75% legibility for his name in cursive.Pt required increased time to complete task.                   OT Education - 03/19/18 0945    Education provided  Yes    Education Details  LUE functioning    Person(s) Educated  Patient    Methods  Explanation;Demonstration    Comprehension  Verbalized understanding;Returned demonstration          OT Long Term Goals - 02/25/18 1546      OT LONG TERM GOAL #3   Title  Pt. left hand grip with  increase by 5# in preparation for opening a jar.    Baseline  02/25/2018: Left grip strength has improved by 12#. Pt. continues to have difficultyusing his left hand to open containers.      Time  12    Period  Weeks    Status  On-going    Target Date  05/20/18      OT LONG TERM GOAL #6   Title  Pt. will improve left hand coordination skills to be able to button clothing.    Baseline  02/25/2018: Pt. has improved with left hand coordination skills by 7 sec. of speed. Pt. continues to have difficulty with buttoning.    Time  12    Period  Weeks    Status  On-going    Target Date  05/20/18      OT LONG TERM GOAL #8   Title  Pt. will increase Left pinch strength to be able to grasp and hold items during ADL tasks.    Baseline  02/25/2018: Pt. continues to progress with  pinch strength, and ability to grasp, howeevr has difficulty holding objects.    Time  12    Period  Weeks    Status  On-going    Target Date  05/20/18      OT LONG TERM GOAL  #9   Baseline  Pt. will write one sentence with 75% legibility and efficiently while maintaing grasp on pen 100% of the time.    Time  12    Period  Weeks    Status  On-going    Target Date  05/20/18      OT LONG TERM GOAL  #10   TITLE  Pt. will independently use his left hand to grasp and efficiently accomodate to the varying weights of ADL objects during self care tasks.     Baseline  02/26/2019: Pt. continues to work on grasping and adjusting hand  to the varying weights of objects.    Time  12    Period  Weeks    Status  On-going    Target Date  05/20/18  OT LONG TERM GOAL  #11   TITLE  Pt. will improve [facility] skills to be able to independently manipulate items for ADLs/IADLs when UEs are elevated when reaching into the closet, or microwave.    Baseline  02/25/2018: Pt. is progressing however continues to have difficulty manipulating objects when UEs elevated     Time  12    Period  Weeks    Status  On-going    Target Date  05/20/18            Plan - 03/19/18 0949    Clinical Impression Statement  Pt continues to work on LUE and hand function in order to increase independence during ADLs and IADLs. Pt worked on translatory movements of the hand and reaching towards a target with arms sustained in elevation overhead. Pt experiences increased hand stiffness in the mornings and demonstrates decreased accuracy when reaching overhead.    Occupational performance deficits (Please refer to evaluation for details):  ADL's;IADL's    Rehab Potential  Excellent    OT Frequency  2x / week    OT Duration  12 weeks    OT Treatment/Interventions  Self-care/ADL training;Therapeutic exercise;Neuromuscular education;Patient/family education;Energy conservation;Therapeutic activities;DME and/or AE instruction    Clinical Decision Making  Several treatment options, min-mod task modification necessary    Consulted and Agree with Plan of Care  Patient       Patient will benefit from skilled therapeutic intervention in order to improve the following deficits and impairments:  Abnormal gait, Pain, Decreased strength, Decreased range of motion, Impaired UE functional use, Decreased coordination, Decreased knowledge of use of DME, Decreased activity tolerance, Difficulty walking, Decreased balance, Other (comment)  Visit Diagnosis: Muscle weakness (generalized)  Other lack of coordination    Problem List Patient Active Problem List    Diagnosis Date Noted  . History of stroke 02/24/2018  . Pure hypercholesterolemia 05/28/2017  . Erectile dysfunction 05/28/2017  . Left hemiparesis (HCC)   . Essential hypertension 11/29/2016  . 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 Hepatitis C 07/22/2013    Ernesto Rutherfordaryn Corretta Munce, OTS 03/19/2018, 9:53 AM   This entire session was performed under direct supervision and direction of a licensed therapist/therapist assistant . I have personally read, edited and approve of the note as written.  Olegario MessierElaine Jagentenfl, MS, OTR/L   [facility] [address] [place], [place], [address] Phone: [phone]   Fax:  [phone]  Name: Jason Nolan MRN: [id number] Date of Birth: 1945/05/28

## 2018-03-24 ENCOUNTER — Ambulatory Visit: Payer: Medicare Other | Admitting: Occupational Therapy

## 2018-03-24 ENCOUNTER — Encounter: Payer: Self-pay | Admitting: Occupational Therapy

## 2018-03-24 ENCOUNTER — Telehealth: Payer: Self-pay | Admitting: *Deleted

## 2018-03-24 DIAGNOSIS — M6281 Muscle weakness (generalized): Secondary | ICD-10-CM | POA: Diagnosis not present

## 2018-03-24 DIAGNOSIS — R278 Other lack of coordination: Secondary | ICD-10-CM

## 2018-03-24 NOTE — Telephone Encounter (Signed)
Mr Jason Nolan called to tell Dr Wynn BankerKirsteins he is having headaches and is asking for advice on how to proceed.

## 2018-03-24 NOTE — Therapy (Addendum)
[facility] [facility] MAIN [facility] SERVICES [address] [place], [place], [address] Phone: [phone]   Fax:  [phone]  Occupational Therapy Treatment  Patient Details  Name: Jason Nolan MRN: [id number] Date of Birth: June 28, 1945 No data recorded  Encounter Date: 03/24/2018  OT End of Session - 03/24/18 0948    Visit Number  86    Number of Visits  96    Date for OT Re-Evaluation  05/20/18    Authorization Type  Medicare 7 of 10, reporting period starting 03/02/2018    OT Start Time  0930    OT Stop Time  1015    OT Time Calculation (min)  45 min    Activity Tolerance  Patient tolerated treatment well    Behavior During Therapy  [employer] for tasks assessed/performed       Past Medical History:  Diagnosis Date  . Glaucoma   . Hepatitis C   . Stroke [facility])     Past Surgical History:  Procedure Laterality Date  . IR ANGIO INTRA EXTRACRAN SEL COM CAROTID INNOMINATE UNI L MOD SED  11/25/2016  . IR ANGIO VERTEBRAL SEL SUBCLAVIAN INNOMINATE UNI R MOD SED  11/25/2016  . IR ANGIO VERTEBRAL SEL VERTEBRAL UNI L MOD SED  11/25/2016  . IR INTRAVSC STENT CERV CAROTID W/O EMB-PROT MOD SED INC ANGIO  11/25/2016  . IR PERCUTANEOUS ART THROMBECTOMY/INFUSION INTRACRANIAL INC DIAG ANGIO  11/25/2016  . IR RADIOLOGIST EVAL & MGMT  02/13/2017  . RADIOLOGY WITH ANESTHESIA N/A 11/25/2016   Procedure: RADIOLOGY WITH ANESTHESIA;  Surgeon: Radiologist, Medication, MD;  Location: [facility] OR;  Service: Radiology;  Laterality: N/A;    There were no vitals filed for this visit.  Subjective Assessment - 03/24/18 0942    Subjective   Pt reports that he been experiencing posterior headaches consistently for the last week.    Patient is accompained by:  Family member    Pertinent History  Pt. is a 72 y.o. 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 Pt. was transferred to [facility]. He underwent CTA brain showed short segment near occlusion of proximal R-ICA with associated  intraluminal thrombus --likely due to dissection and emergent large vessel occlusion with right M2 occlusion and evolving right frontal lobe infarct.  He underwent cerebral angio with  complete revascularization of occluded superior division of R-MCA and near complete revascularization of proximal R-ICA with stent assisted angioplasty.  Stroke felt to be embolic in setting of ruptured plaque R-ICA and he was placed on ASA and brillinta due to stent. Pt. was transferred to inpatient rehab for several weeks, had home health therapy upon discharge, and now is ready for outpatient therapy services.    Limitations  Dominant LUE functioning, left hand edema, distal ROM, coordination, and overal strength.    Patient Stated Goals  To regain the use of his LUE.    Currently in Pain?  Yes    Pain Score  1     Pain Location  Head    Pain Orientation  Posterior    Pain Descriptors / Indicators  Aching;Dull    Pain Type  Acute pain    Pain Onset  In the past 7 days    Pain Frequency  Constant    Pain Relieving Factors  OTC medication    Multiple Pain Sites  No      OT TREATMENT  Neuromuscular re-education: Pt completed left hand [facility] task that required him to manipulate small, spherical glass  marbles focusing on translatory movements to move marbles from his palm to his finger tips. Pt required verbal cuing to minimize postural compensatory strategies such as leaning laterally in his seat and excessively abducting at the shoulder. Pt required cuing to pronate at the forearm to place marbles opposed to dropping them while supinated. Pt presents today with less stiffness and more motor control compared to previous sessions. Pt worked on bilateral [facility] skills grasping, and manipulating knots using string of varying sizes. Pt started with thicker rope and worked towards small lace rope. Pt worked on knots of varying degrees of difficulty. Pt required increased time to untie tight  knots.                    OT Education - 03/24/18 0947    Education provided  Yes    Education Details  LUE functioning    Person(s) Educated  Patient    Methods  Explanation;Demonstration;Verbal cues    Comprehension  Verbalized understanding;Returned demonstration          OT Long Term Goals - 02/25/18 1546      OT LONG TERM GOAL #3   Title  Pt. left hand grip with  increase by 5# in preparation for opening a jar.    Baseline  02/25/2018: Left grip strength has improved by 12#. Pt. continues to have difficultyusing his left hand to open containers.      Time  12    Period  Weeks    Status  On-going    Target Date  05/20/18      OT LONG TERM GOAL #6   Title  Pt. will improve left hand coordination skills to be able to button clothing.    Baseline  02/25/2018: Pt. has improved with left hand coordination skills by 7 sec. of speed. Pt. continues to have difficulty with buttoning.    Time  12    Period  Weeks    Status  On-going    Target Date  05/20/18      OT LONG TERM GOAL #8   Title  Pt. will increase Left pinch strength to be able to grasp and hold items during ADL tasks.    Baseline  02/25/2018: Pt. continues to progress with  pinch strength, and ability to grasp, howeevr has difficulty holding objects.    Time  12    Period  Weeks    Status  On-going    Target Date  05/20/18      OT LONG TERM GOAL  #9   Baseline  Pt. will write one sentence with 75% legibility and efficiently while maintaing grasp on pen 100% of the time.    Time  12    Period  Weeks    Status  On-going    Target Date  05/20/18      OT LONG TERM GOAL  #10   TITLE  Pt. will independently use his left hand to grasp and efficiently accomodate to the varying weights of ADL objects during self care tasks.     Baseline  02/26/2019: Pt. continues to work on grasping and adjusting hand to the varying weights of objects.    Time  12    Period  Weeks    Status  On-going    Target Date   05/20/18      OT LONG TERM GOAL  #11   TITLE  Pt. will improve [facility] skills to be able to independently manipulate items for ADLs/IADLs  when UEs are elevated when reaching into the closet, or microwave.    Baseline  02/25/2018: Pt. is progressing however continues to have difficulty manipulating objects when UEs elevated     Time  12    Period  Weeks    Status  On-going    Target Date  05/20/18            Plan - 03/24/18 0948    Clinical Impression Statement  Pt presents today with complaints of a posterior headache that he has had consistently for the last week. Pt reports pain of a 1/10 during treatment today. Pt's vitals were taken at the start of treatment and were found to be 132/68 mmHg and 52 bpm. Pt continues to work on LUE and hand function in order to increase independence during ADLs and IADLs. Pt worked on translatory movements of the left hand using small objects of various sizes, textures, and weights while focusing to minimize postural compensatory patterns such as leaning laterally and abducting at the shoulder. Pt presents today with less stiffness and more motor control that previous sessions.    Occupational performance deficits (Please refer to evaluation for details):  ADL's;IADL's    Rehab Potential  Excellent    OT Frequency  2x / week    OT Duration  12 weeks    OT Treatment/Interventions  Self-care/ADL training;Therapeutic exercise;Neuromuscular education;Patient/family education;Energy conservation;Therapeutic activities;DME and/or AE instruction    Clinical Decision Making  Several treatment options, min-mod task modification necessary    Consulted and Agree with Plan of Care  Patient       Patient will benefit from skilled therapeutic intervention in order to improve the following deficits and impairments:  Abnormal gait, Pain, Decreased strength, Decreased range of motion, Impaired UE functional use, Decreased coordination, Decreased knowledge of use of DME,  Decreased activity tolerance, Difficulty walking, Decreased balance, Other (comment)  Visit Diagnosis: Other lack of coordination    Problem List Patient Active Problem List   Diagnosis Date Noted  . History of stroke 02/24/2018  . Pure hypercholesterolemia 05/28/2017  . Erectile dysfunction 05/28/2017  . Left hemiparesis (HCC)   . Essential hypertension 11/29/2016  . 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 Hepatitis C 07/22/2013    Ernesto Rutherfordaryn Jaquell Seddon, OTS 03/24/2018, 9:59 AM   This entire session was performed under direct supervision and direction of a licensed therapist/therapist assistant . I have personally read, edited and approve of the note as written.  Olegario MessierElaine Jagentenfl, MS, OTR/L   [place] [facility]IN [facility] [address] [place], [place], [address] Phone: [phone]   Fax:  [phone]  Name: Jason CroakJames Nolan MRN: [id number] Date of Birth: 1945-12-29

## 2018-03-24 NOTE — Telephone Encounter (Signed)
Notified Mr Allyne GeeSanders.

## 2018-03-24 NOTE — Telephone Encounter (Signed)
He may try regular strength tylenol 2 tablets up to 4 times a day If this is not helpful please call back and we'll call in some topamax

## 2018-03-26 ENCOUNTER — Encounter: Payer: Self-pay | Admitting: Internal Medicine

## 2018-03-26 ENCOUNTER — Ambulatory Visit: Payer: Medicare Other | Admitting: Occupational Therapy

## 2018-03-26 DIAGNOSIS — M6281 Muscle weakness (generalized): Secondary | ICD-10-CM | POA: Diagnosis not present

## 2018-03-26 DIAGNOSIS — R278 Other lack of coordination: Secondary | ICD-10-CM

## 2018-03-26 NOTE — Therapy (Addendum)
[facility] MAIN [facility] SERVICES [address] [place], [place], [address] Phone: [phone]   Fax:  [phone]  Occupational Therapy Treatment  Patient Details  Name: Jason Nolan MRN: [id number] Date of Birth: Oct 03, 1945 No data recorded  Encounter Date: 03/26/2018  OT End of Session - 03/26/18 0938    Visit Number  87    Number of Visits  96    Date for OT Re-Evaluation  05/20/18    Authorization Type  Medicare 8 of 10, reporting period starting 03/02/2018    OT Start Time  0930    OT Stop Time  1015    OT Time Calculation (min)  45 min    Activity Tolerance  Patient tolerated treatment well    Behavior During Therapy  [facility] for tasks assessed/performed       Past Medical History:  Diagnosis Date  . Glaucoma   . Hepatitis C   . Stroke [facility])     Past Surgical History:  Procedure Laterality Date  . IR ANGIO INTRA EXTRACRAN SEL COM CAROTID INNOMINATE UNI L MOD SED  11/25/2016  . IR ANGIO VERTEBRAL SEL SUBCLAVIAN INNOMINATE UNI R MOD SED  11/25/2016  . IR ANGIO VERTEBRAL SEL VERTEBRAL UNI L MOD SED  11/25/2016  . IR INTRAVSC STENT CERV CAROTID W/O EMB-PROT MOD SED INC ANGIO  11/25/2016  . IR PERCUTANEOUS ART THROMBECTOMY/INFUSION INTRACRANIAL INC DIAG ANGIO  11/25/2016  . IR RADIOLOGIST EVAL & MGMT  02/13/2017  . RADIOLOGY WITH ANESTHESIA N/A 11/25/2016   Procedure: RADIOLOGY WITH ANESTHESIA;  Surgeon: Radiologist, Medication, MD;  Location: [facility] OR;  Service: Radiology;  Laterality: N/A;    There were no vitals filed for this visit.  Subjective Assessment - 03/26/18 0934    Subjective   Pt reports he has followed up with his physician regarding his headaches and has not found much relief.    Patient is accompained by:  Family member    Pertinent History  Pt. is a 72 y.o. 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 Pt. was transferred to [facility]. He underwent CTA brain showed short segment near occlusion of proximal R-ICA  with associated intraluminal thrombus --likely due to dissection and emergent large vessel occlusion with right M2 occlusion and evolving right frontal lobe infarct.  He underwent cerebral angio with  complete revascularization of occluded superior division of R-MCA and near complete revascularization of proximal R-ICA with stent assisted angioplasty.  Stroke felt to be embolic in setting of ruptured plaque R-ICA and he was placed on ASA and brillinta due to stent. Pt. was transferred to inpatient rehab for several weeks, had home health therapy upon discharge, and now is ready for outpatient therapy services.    Limitations  Dominant LUE functioning, left hand edema, distal ROM, coordination, and overal strength.    Patient Stated Goals  To regain the use of his LUE.    Currently in Pain?  Yes    Pain Score  3     Pain Location  Head    Pain Orientation  Posterior    Pain Descriptors / Indicators  Aching;Dull    Pain Type  Acute pain    Pain Onset  In the past 7 days    Pain Frequency  Constant    Pain Relieving Factors  OTC medication    Multiple Pain Sites  No       OT TREATMENT  Neuromuscular re-education:   Pt. worked on grasping, and positioning  magnetic hooks on a whiteboard positioned at a vertical angle on an elevated surface using his left hand. Pt. worked on [facility] skills grasping 1/2" flat washers, and placing them on the hooks. Pt required increased time to place hooks and demonstrates decreased [facility] with reaching. As the task progressed, pt demonstrated increased postural compensatory patterns   Self-care:   Pt. worked on Careers information officer and speed. Pt. was able to write one sentence in print form in 3 mins. and 12 secs. Pt was able to write the same sentence in cursive form in 2 mins. and 11 secs. Pt. used a mature left hand grasp on the pen. Pt. had to readjust the pen in is hand x3 during the exercise. Pt used a standard pen and unlined paper during the task. Pt demonstrates  decreased spacing between words and lines. Pt did not significantly deviate from the line nor the margins. Pt's writing size tends to decrease as the task progresses. Pt demonstrates ~75% legibility when writing in print forms and ~50% legibility when writing in cursive.             OT Education - 03/26/18 [phone]    Education provided  Yes    Education Details  LUE functioning    Person(s) Educated  Patient    Methods  Explanation;Demonstration;Verbal cues    Comprehension  Verbalized understanding;Returned demonstration          OT Long Term Goals - 02/25/18 1546      OT LONG TERM GOAL #3   Title  Pt. left hand grip with  increase by 5# in preparation for opening a jar.    Baseline  02/25/2018: Left grip strength has improved by 12#. Pt. continues to have difficultyusing his left hand to open containers.      Time  12    Period  Weeks    Status  On-going    Target Date  05/20/18      OT LONG TERM GOAL #6   Title  Pt. will improve left hand coordination skills to be able to button clothing.    Baseline  02/25/2018: Pt. has improved with left hand coordination skills by 7 sec. of speed. Pt. continues to have difficulty with buttoning.    Time  12    Period  Weeks    Status  On-going    Target Date  05/20/18      OT LONG TERM GOAL #8   Title  Pt. will increase Left pinch strength to be able to grasp and hold items during ADL tasks.    Baseline  02/25/2018: Pt. continues to progress with  pinch strength, and ability to grasp, howeevr has difficulty holding objects.    Time  12    Period  Weeks    Status  On-going    Target Date  05/20/18      OT LONG TERM GOAL  #9   Baseline  Pt. will write one sentence with 75% legibility and efficiently while maintaing grasp on pen 100% of the time.    Time  12    Period  Weeks    Status  On-going    Target Date  05/20/18      OT LONG TERM GOAL  #10   TITLE  Pt. will independently use his left hand to grasp and efficiently  accomodate to the varying weights of ADL objects during self care tasks.     Baseline  02/26/2019: Pt. continues to work on grasping and  adjusting hand to the varying weights of objects.    Time  12    Period  Weeks    Status  On-going    Target Date  05/20/18      OT LONG TERM GOAL  #11   TITLE  Pt. will improve [facility] skills to be able to independently manipulate items for ADLs/IADLs when UEs are elevated when reaching into the closet, or microwave.    Baseline  02/25/2018: Pt. is progressing however continues to have difficulty manipulating objects when UEs elevated     Time  12    Period  Weeks    Status  On-going    Target Date  05/20/18            Plan - 03/26/18 [id number]    Clinical Impression Statement  Pt continues to present with complaints of a posterior headache (2/10) that starts dull in the morning and progressively gets worse as the day goes on. Pt's vitals were taken at the start of treatment and were found to be 141/64 mmHg and 61 bpm. Pt worked on left hand [facility] skills while keeping LUE sustained in elevation above head. Pt worked to improve pinch and grip strength to promote independence during ADLs and IADLs such as opening containers, holding a standard writing utensil, and fastening fasteners.    Occupational performance deficits (Please refer to evaluation for details):  ADL's;IADL's    Rehab Potential  Excellent    OT Frequency  2x / week    OT Duration  12 weeks    OT Treatment/Interventions  Self-care/ADL training;Therapeutic exercise;Neuromuscular education;Patient/family education;Energy conservation;Therapeutic activities;DME and/or AE instruction    Clinical Decision Making  Several treatment options, min-mod task modification necessary    Consulted and Agree with Plan of Care  Patient       Patient will benefit from skilled therapeutic intervention in order to improve the following deficits and impairments:  Abnormal gait, Pain, Decreased strength, Decreased  range of motion, Impaired UE functional use, Decreased coordination, Decreased knowledge of use of DME, Decreased activity tolerance, Difficulty walking, Decreased balance, Other (comment)  Visit Diagnosis: Other lack of coordination  Muscle weakness (generalized)    Problem List Patient Active Problem List   Diagnosis Date Noted  . History of stroke 02/24/2018  . Pure hypercholesterolemia 05/28/2017  . Erectile dysfunction 05/28/2017  . Left hemiparesis (HCC)   . Essential hypertension 11/29/2016  . 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 Hepatitis C 07/22/2013    Ernesto Rutherfordaryn Chasady Longwell, OTS 03/26/2018, 9:48 AM   This entire session was performed under direct supervision and direction of a licensed therapist/therapist assistant . I have personally read, edited and approve of the note as written.  Olegario MessierElaine Jagentenfl, MS, OTR/L   [place] [facility] MAIN [facility] [address] [place], [place], [address] Phone: [phone]   Fax:  [phone]  Name: Terrilee CroakJames Tijerina MRN: [id number] Date of Birth: 1946-01-24

## 2018-03-27 ENCOUNTER — Telehealth: Payer: Self-pay | Admitting: *Deleted

## 2018-03-27 MED ORDER — TOPIRAMATE 25 MG PO TABS: 25.0000 mg | ORAL_TABLET | Freq: Two times a day (BID) | ORAL | 2 refills | Status: DC

## 2018-03-27 NOTE — Telephone Encounter (Signed)
Patient left a message that he has been taking the tylenol as recommended and his headaches still bother him.  He is asking what to do next.  I spoke with Dr. Wynn BankerKirsteins and he said to go ahead with topirimate 25 mg BID #60 2 refills. I tried to contact patient and inform

## 2018-03-30 ENCOUNTER — Encounter: Payer: Self-pay | Admitting: Occupational Therapy

## 2018-03-30 ENCOUNTER — Ambulatory Visit: Payer: Medicare Other | Admitting: Occupational Therapy

## 2018-03-30 DIAGNOSIS — R278 Other lack of coordination: Secondary | ICD-10-CM

## 2018-03-30 DIAGNOSIS — M6281 Muscle weakness (generalized): Secondary | ICD-10-CM | POA: Diagnosis not present

## 2018-03-30 NOTE — Therapy (Addendum)
[place] [facility] MAIN [facility] SERVICES [address] [place], [place], [address] Phone: [phone]   Fax:  [phone]  Occupational Therapy Treatment  Patient Details  Name: Jason Nolan MRN: [id number] Date of Birth: October 08, 1945 No data recorded  Encounter Date: 03/30/2018  OT End of Session - 03/30/18 0937    Visit Number  88    Number of Visits  96    Date for OT Re-Evaluation  05/20/18    Authorization Type  Medicare 9 of 10, reporting period starting 03/02/2018    OT Start Time  0930    OT Stop Time  1015    OT Time Calculation (min)  45 min    Activity Tolerance  Patient tolerated treatment well    Behavior During Therapy  [facility] for tasks assessed/performed       Past Medical History:  Diagnosis Date  . Glaucoma   . Hepatitis C   . Stroke [facility])     Past Surgical History:  Procedure Laterality Date  . IR ANGIO INTRA EXTRACRAN SEL COM CAROTID INNOMINATE UNI L MOD SED  11/25/2016  . IR ANGIO VERTEBRAL SEL SUBCLAVIAN INNOMINATE UNI R MOD SED  11/25/2016  . IR ANGIO VERTEBRAL SEL VERTEBRAL UNI L MOD SED  11/25/2016  . IR INTRAVSC STENT CERV CAROTID W/O EMB-PROT MOD SED INC ANGIO  11/25/2016  . IR PERCUTANEOUS ART THROMBECTOMY/INFUSION INTRACRANIAL INC DIAG ANGIO  11/25/2016  . IR RADIOLOGIST EVAL & MGMT  02/13/2017  . RADIOLOGY WITH ANESTHESIA N/A 11/25/2016   Procedure: RADIOLOGY WITH ANESTHESIA;  Surgeon: Radiologist, Medication, MD;  Location: [facility] OR;  Service: Radiology;  Laterality: N/A;    There were no vitals filed for this visit.  Subjective Assessment - 03/30/18 0933    Subjective   Pt reports he continues to have headaches and talking to the PA and getting a prescription.    Patient is accompained by:  Family member    Pertinent History  Pt. is a 72 y.o. 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 Pt. was transferred to [facility]. He underwent CTA brain showed short segment near occlusion of proximal R-ICA with associated  intraluminal thrombus --likely due to dissection and emergent large vessel occlusion with right M2 occlusion and evolving right frontal lobe infarct.  He underwent cerebral angio with  complete revascularization of occluded superior division of R-MCA and near complete revascularization of proximal R-ICA with stent assisted angioplasty.  Stroke felt to be embolic in setting of ruptured plaque R-ICA and he was placed on ASA and brillinta due to stent. Pt. was transferred to inpatient rehab for several weeks, had home health therapy upon discharge, and now is ready for outpatient therapy services.    Limitations  Dominant LUE functioning, left hand edema, distal ROM, coordination, and overal strength.    Patient Stated Goals  To regain the use of his LUE.    Currently in Pain?  Yes    Pain Score  2     Pain Location  Head    Pain Orientation  Posterior    Pain Descriptors / Indicators  Aching;Dull    Pain Type  Acute pain    Pain Onset  1 to 4 weeks ago    Pain Frequency  Constant    Pain Relieving Factors  OTC medication    Multiple Pain Sites  No      OT TREATMENT  Neuromuscular re-education:  Pt completed left hand [facility] task that required him to  manipulate small glass marbles and work on translating them to his fingertips one at a time. Pt frequently dropped pegs when storing x5 in his palm. Pt reports increased hand stiffness today compared to previous sessions. Pt requires increased verbal cuing to translate marbles to the tip of his digits instead of the volar aspect of the 2nd digit DIP joint.    Therapeutic exercise:   Pt. worked on pinch strengthening in the left hand for lateral, and 3pt. pinch using red and green resistive clips. Pt. worked on placing the clips at various vertical and horizontal angles. Verbal cues were required for eliciting the desired movement. Pt required increased time to grasp clips and position his left hand. Pt demonstrated proximal compensatory movements  including excessively abducting at the shoulder and leaning laterally.             OT Education - 03/30/18 [phone]    Education provided  Yes    Education Details  LUE functioning    Person(s) Educated  Patient    Methods  Explanation;Demonstration;Verbal cues    Comprehension  Verbalized understanding;Returned demonstration          OT Long Term Goals - 02/25/18 1546      OT LONG TERM GOAL #3   Title  Pt. left hand grip with  increase by 5# in preparation for opening a jar.    Baseline  02/25/2018: Left grip strength has improved by 12#. Pt. continues to have difficultyusing his left hand to open containers.      Time  12    Period  Weeks    Status  On-going    Target Date  05/20/18      OT LONG TERM GOAL #6   Title  Pt. will improve left hand coordination skills to be able to button clothing.    Baseline  02/25/2018: Pt. has improved with left hand coordination skills by 7 sec. of speed. Pt. continues to have difficulty with buttoning.    Time  12    Period  Weeks    Status  On-going    Target Date  05/20/18      OT LONG TERM GOAL #8   Title  Pt. will increase Left pinch strength to be able to grasp and hold items during ADL tasks.    Baseline  02/25/2018: Pt. continues to progress with  pinch strength, and ability to grasp, howeevr has difficulty holding objects.    Time  12    Period  Weeks    Status  On-going    Target Date  05/20/18      OT LONG TERM GOAL  #9   Baseline  Pt. will write one sentence with 75% legibility and efficiently while maintaing grasp on pen 100% of the time.    Time  12    Period  Weeks    Status  On-going    Target Date  05/20/18      OT LONG TERM GOAL  #10   TITLE  Pt. will independently use his left hand to grasp and efficiently accomodate to the varying weights of ADL objects during self care tasks.     Baseline  02/26/2019: Pt. continues to work on grasping and adjusting hand to the varying weights of objects.    Time  12     Period  Weeks    Status  On-going    Target Date  05/20/18      OT LONG TERM GOAL  #11  TITLE  Pt. will improve [facility] skills to be able to independently manipulate items for ADLs/IADLs when UEs are elevated when reaching into the closet, or microwave.    Baseline  02/25/2018: Pt. is progressing however continues to have difficulty manipulating objects when UEs elevated     Time  12    Period  Weeks    Status  On-going    Target Date  05/20/18            Plan - 03/30/18 1610    Clinical Impression Statement  Pt continues to present with complaints of a posterior headache (2/10) that starts dull in the morning and progressively gets worse as the day goes on. Pt worked on left hand [facility] skills that required him to grasp and hold various small items before translating them to his fingertips. Pt worked to improve pinch and grip strength to promote independence during ADLs and IADLs such as opening containers, holding a standard writing utensil, and fastening fasteners.    Occupational performance deficits (Please refer to evaluation for details):  ADL's;IADL's    Rehab Potential  Excellent    OT Frequency  2x / week    OT Duration  12 weeks    OT Treatment/Interventions  Self-care/ADL training;Therapeutic exercise;Neuromuscular education;Patient/family education;Energy conservation;Therapeutic activities;DME and/or AE instruction    Clinical Decision Making  Several treatment options, min-mod task modification necessary    Consulted and Agree with Plan of Care  Patient       Patient will benefit from skilled therapeutic intervention in order to improve the following deficits and impairments:  Abnormal gait, Pain, Decreased strength, Decreased range of motion, Impaired UE functional use, Decreased coordination, Decreased knowledge of use of DME, Decreased activity tolerance, Difficulty walking, Decreased balance, Other (comment)  Visit Diagnosis: Other lack of coordination  Muscle  weakness (generalized)    Problem List Patient Active Problem List   Diagnosis Date Noted  . History of stroke 02/24/2018  . Pure hypercholesterolemia 05/28/2017  . Erectile dysfunction 05/28/2017  . Left hemiparesis (HCC)   . Essential hypertension 11/29/2016  . 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 Hepatitis C 07/22/2013    Ernesto Rutherford, OTS 03/30/2018, 10:09 AM   This entire session was performed under direct supervision and direction of a licensed therapist/therapist assistant . I have personally read, edited and approve of the note as written.  Olegario Messier, MS, OTR/L   [place] [facility] MAIN [facility] SERVICES [address] [place], [place], [address] Phone: [phone]   Fax:  [phone]  Name: Yordin Rhoda MRN: [id number] Date of Birth: June 14, 1945

## 2018-03-31 ENCOUNTER — Encounter: Payer: Self-pay | Admitting: Gastroenterology

## 2018-03-31 ENCOUNTER — Ambulatory Visit (INDEPENDENT_AMBULATORY_CARE_PROVIDER_SITE_OTHER): Payer: Medicare Other | Admitting: Gastroenterology

## 2018-03-31 ENCOUNTER — Other Ambulatory Visit: Payer: Self-pay

## 2018-03-31 VITALS — BP 132/73 | HR 52 | Ht 70.0 in | Wt 194.2 lb

## 2018-03-31 DIAGNOSIS — Z1211 Encounter for screening for malignant neoplasm of colon: Secondary | ICD-10-CM

## 2018-03-31 DIAGNOSIS — B1920 Unspecified viral hepatitis C without hepatic coma: Secondary | ICD-10-CM | POA: Diagnosis not present

## 2018-03-31 DIAGNOSIS — R7689 Other specified abnormal immunological findings in serum: Secondary | ICD-10-CM

## 2018-03-31 DIAGNOSIS — R768 Other specified abnormal immunological findings in serum: Secondary | ICD-10-CM

## 2018-03-31 DIAGNOSIS — I639 Cerebral infarction, unspecified: Secondary | ICD-10-CM | POA: Diagnosis not present

## 2018-03-31 NOTE — Progress Notes (Signed)
Jason MoodKiran Eleanor Dimichele MD, MRCP(U.K) [address]  [address]  [place], [place] [address]  Main: [phone]  Fax: [phone]   Gastroenterology Consultation  Referring Provider:     Lorre MunroeBaity, Jason W, NP Primary Care Physician:  Lorre MunroeBaity, Jason W, NP Primary Gastroenterologist:  Dr. Wyline MoodKiran Dereonna Nolan  Reason for Consultation:     Hepatitis B         HPI:   Jason CroakJames Nolan is a 72 y.o. y/o male referred for consultation & management  by Dr. Sampson Nolan, Jason Oxfordegina W, NP.    He has been referred for Hepaititis B. [facility] for hepatitis C . Last office visit back in 12/2015 . S/p harvoni in 2015 with cure attained. Prior treatment with Pg-interferon in 2009 . 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   Recent labs reviewed : 01/15/18 : hepatitis B surface antibody is positive.  06/2017: Hepatitis B core antibody is positive. HCV viral load,HIV is negative.  06/2017 : normal platelet count and hb .  05/2017 : CMP normal.   06/2017: RUQ USG normal liver   Last colonoscopy in 05/2008 and was normal .     Past Medical History:  Diagnosis Date  . Glaucoma   . Hepatitis C   . Stroke [facility](HCC)     Past Surgical History:  Procedure Laterality Date  . IR ANGIO INTRA EXTRACRAN SEL COM CAROTID INNOMINATE UNI L MOD SED  11/25/2016  . IR ANGIO VERTEBRAL SEL SUBCLAVIAN INNOMINATE UNI R MOD SED  11/25/2016  . IR ANGIO VERTEBRAL SEL VERTEBRAL UNI L MOD SED  11/25/2016  . IR INTRAVSC STENT CERV CAROTID W/O EMB-PROT MOD SED INC ANGIO  11/25/2016  . IR PERCUTANEOUS ART THROMBECTOMY/INFUSION INTRACRANIAL INC DIAG ANGIO  11/25/2016  . IR RADIOLOGIST EVAL & MGMT  02/13/2017  . RADIOLOGY WITH ANESTHESIA N/A 11/25/2016   Procedure: RADIOLOGY WITH ANESTHESIA;  Surgeon: Radiologist, Medication, MD;  Location: [facility] OR;  Service: Radiology;  Laterality: N/A;    Prior to Admission medications   Medication Sig Start Date End Date Taking? Authorizing Provider  amLODipine (NORVASC) 10 MG tablet TAKE 1 TABLET DAILY  07/24/17  Yes Lorre MunroeBaity, Jason W, NP  aspirin 81 MG chewable tablet Chew 1 tablet (81 mg total) by mouth daily. 12/21/16  Yes Nolan, Jason KannerPamela S, PA-C  atorvastatin (LIPITOR) 40 MG tablet TAKE 1 TABLET DAILY AT 6 P.M. 09/02/17  Yes Nolan, Jason Oxfordegina W, NP  dorzolamide-timolol (COSOPT) 22.3-6.8 MG/ML ophthalmic solution Place 1 drop into both eyes.  05/23/17  Yes [provider]  latanoprost (XALATAN) 0.005 % ophthalmic solution INSTILL 1 DROP INTO BOTH EYES EVERY DAY IN THE EVENING 03/17/18  Yes [provider]  tadalafil (CIALIS) 20 MG tablet TAKE 0.5-1 TABLETS (10-20 MG TOTAL) BY MOUTH EVERY OTHER DAY AS NEEDED FOR ERECTILE DYSFUNCTION. 09/06/17  Yes [provider]  topiramate (TOPAMAX) 25 MG tablet Take 1 tablet (25 mg total) by mouth 2 (two) times daily. 03/27/18  Yes Nolan, Victorino SparrowAndrew E, MD  VYZULTA 0.024 % SOLN Apply 1 drop to eye at bedtime.  11/12/16   [provider]    Family History  Problem Relation Age of Onset  . Hypertension Mother   . Hypertension Father      Social History   Tobacco Use  . Smoking status: Former Smoker    Packs/day: 0.50    Years: 15.00    Pack years: 7.50    Last attempt to quit: 1990    Years since quitting: 29.9  .  Smokeless tobacco: Never Used  Substance Use Topics  . Alcohol use: No    Alcohol/week: 0.0 standard drinks  . Drug use: No    Allergies as of 03/31/2018  . (No Known Allergies)    Review of Systems:    All systems reviewed and negative except where noted in HPI.   Physical Exam:  BP 132/73   Pulse (!) 52   Ht 5\' 10"  (1.778 m)   Wt 194 lb 3.2 oz (88.1 kg)   BMI 27.86 kg/m  No LMP for male patient. Psych:  Alert and cooperative. Normal Nolan and affect. General:   Alert,  Well-developed, well-nourished, pleasant and cooperative in NAD Head:  Normocephalic and atraumatic. Eyes:  Sclera clear, no icterus.   Conjunctiva pink. Ears:  Normal auditory acuity. Nose:  No deformity, discharge, or  lesions. Mouth:  No deformity or lesions,oropharynx pink & moist. Neck:  Supple; no masses or thyromegaly. Lungs:  Respirations even and unlabored.  Clear throughout to auscultation.   No wheezes, crackles, or rhonchi. No acute distress. Heart:  Regular rate and rhythm; no murmurs, clicks, rubs, or gallops. Abdomen:  Normal bowel sounds.  No bruits.  Soft, non-tender and non-distended without masses, hepatosplenomegaly or hernias noted.  No guarding or rebound tenderness.    Msk:  Symmetrical without gross deformities. Good, equal movement & strength bilaterally. Pulses:  Normal pulses noted. Extremities:  No clubbing or edema.  No cyanosis. Neurologic:  Alert and oriented x3;  grossly normal neurologically. Skin:  Intact without significant lesions or rashes. No jaundice. Lymph Nodes:  No significant cervical adenopathy. Psych:  Alert and cooperative. Normal Nolan and affect.  Imaging Studies: No results found.  Assessment and Plan:   Jason Nolan is a 72 y.o. y/o male has been referred for hepatitis B. He has been cured of hepatitis C S/p treatment at [facility], recent viral load is negative. 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 No e antigen checked and no surface antigen checked to confirm .    Plan  1. Hepatitis A antibody to determine if he needs a vaccine 2. Liver elastography to determine if he has cirrhosis although biochemically he appears to not 3. Check hepatitis B surface antigen, E antigen and antibody. 4. Screening colonoscopy 06/2018  I have discussed alternative options, risks & benefits,  which include, but are not limited to, bleeding, infection, perforation,respiratory complication & drug reaction.  The patient agrees with this plan & written consent will be obtained.    Follow up in 6 weeks   Dr Jason Mood MD,MRCP(U.K)

## 2018-04-02 LAB — HEPATITIS B E ANTIBODY: HEP B E AB: POSITIVE — AB

## 2018-04-02 LAB — HEPATITIS A ANTIBODY, TOTAL: HEP A TOTAL AB: POSITIVE — AB

## 2018-04-02 LAB — HEPATITIS B E ANTIGEN: Hep B E Ag: NEGATIVE

## 2018-04-07 ENCOUNTER — Encounter: Payer: Self-pay | Admitting: Occupational Therapy

## 2018-04-07 ENCOUNTER — Ambulatory Visit: Payer: Medicare Other | Attending: Internal Medicine | Admitting: Occupational Therapy

## 2018-04-07 DIAGNOSIS — M6281 Muscle weakness (generalized): Secondary | ICD-10-CM | POA: Insufficient documentation

## 2018-04-07 DIAGNOSIS — R278 Other lack of coordination: Secondary | ICD-10-CM | POA: Diagnosis not present

## 2018-04-07 NOTE — Therapy (Addendum)
[facility] MAIN [facility] SERVICES [address] [place], [place], [address] Phone: [phone]   Fax:  [phone]  Occupational Therapy Treatment/10th visit note  Patient Details  Name: Jason Nolan MRN: [id number] Date of Birth: 1946-03-10 No data recorded  Encounter Date: 04/07/2018  OT End of Session - 04/07/18 0947    Visit Number  89    Number of Visits  96    Date for OT Re-Evaluation  05/20/18    Authorization Type  Medicare10 of 10, reporting period starting 03/02/2018    OT Start Time  0933    OT Stop Time  1015    OT Time Calculation (min)  42 min    Activity Tolerance  Patient tolerated treatment well    Behavior During Therapy  [facility] for tasks assessed/performed       Past Medical History:  Diagnosis Date  . Glaucoma   . Hepatitis C   . Stroke [facility])     Past Surgical History:  Procedure Laterality Date  . IR ANGIO INTRA EXTRACRAN SEL COM CAROTID INNOMINATE UNI L MOD SED  11/25/2016  . IR ANGIO VERTEBRAL SEL SUBCLAVIAN INNOMINATE UNI R MOD SED  11/25/2016  . IR ANGIO VERTEBRAL SEL VERTEBRAL UNI L MOD SED  11/25/2016  . IR INTRAVSC STENT CERV CAROTID W/O EMB-PROT MOD SED INC ANGIO  11/25/2016  . IR PERCUTANEOUS ART THROMBECTOMY/INFUSION INTRACRANIAL INC DIAG ANGIO  11/25/2016  . IR RADIOLOGIST EVAL & MGMT  02/13/2017  . RADIOLOGY WITH ANESTHESIA N/A 11/25/2016   Procedure: RADIOLOGY WITH ANESTHESIA;  Surgeon: Radiologist, Medication, MD;  Location: [facility] OR;  Service: Radiology;  Laterality: N/A;    There were no vitals filed for this visit.  Subjective Assessment - 04/07/18 0940    Subjective   Pt. reports he is concerned about side affects for the medication prescribed for his headaches, so he has decided to try CBD oil instead.     Patient is accompained by:  Family member    Pertinent History  Pt. is a 72 y.o. 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 Pt. was transferred to [facility]. He underwent CTA brain showed  short segment near occlusion of proximal R-ICA with associated intraluminal thrombus --likely due to dissection and emergent large vessel occlusion with right M2 occlusion and evolving right frontal lobe infarct.  He underwent cerebral angio with  complete revascularization of occluded superior division of R-MCA and near complete revascularization of proximal R-ICA with stent assisted angioplasty.  Stroke felt to be embolic in setting of ruptured plaque R-ICA and he was placed on ASA and brillinta due to stent. Pt. was transferred to inpatient rehab for several weeks, had home health therapy upon discharge, and now is ready for outpatient therapy services.    Limitations  Dominant LUE functioning, left hand edema, distal ROM, coordination, and overal strength.    Currently in Pain?  Yes    Pain Score  4     Pain Location  Hip    Pain Orientation  Right         [facility] OT Assessment - 04/07/18 0001      Coordination   Left 9 Hole Peg Test  58 sec.      Hand Function   Left Hand Grip (lbs)  45#    Left Hand Lateral Pinch  17 lbs    Left 3 point pinch  11 lbs      Measurements were obtained, and goals were reviewed  with the pt.   OT TREATMENT    Neuro muscular re-education:  Pt. worked on [facility] grasping 1/2" small magnetic pegs, disconnecting them within his palm, moving them through his hand to the tip of his 2nd digit and thumb, and placing them on a whiteboard positioned at a vertical angle at a tabletop, on an elevated angle to encourage [facility] skills with arms sustained in elevation. Pt. Worked on removing the pegs while alternating thumb opposition to the tip of the 2nd through 5th digits.                      OT Education - 04/07/18 0946    Education provided  Yes    Education Details  LUE functioning    Person(s) Educated  Patient    Methods  Explanation;Demonstration;Verbal cues    Comprehension  Verbalized understanding;Returned demonstration          OT  Long Term Goals - 02/25/18 1546      OT LONG TERM GOAL #3   Title  Pt. left hand grip with  increase by 5# in preparation for opening a jar.    Baseline  04/07/2018: Left grip strength has improved by 8#. Pt. continues to have difficulty using his left hand to open containers.      Time  12    Period  Weeks    Status  On-going    Target Date  05/20/18      OT LONG TERM GOAL #6   Title  Pt. will improve left hand coordination skills to be able to button clothing.    Baseline  04/07/2018: Pt. has improved with left hand coordination skills by 9  sec. of speed. Pt. continues to have difficulty with buttoning with the left hand.   Time  12    Period  Weeks    Status  On-going    Target Date  05/20/18      OT LONG TERM GOAL #8   Title  Pt. will increase Left pinch strength to be able to grasp and hold items during ADL tasks.    Baseline  04/07/2018: Pt. continues to progress with  pinch strength, and ability to grasp, howeevr has difficulty holding objects.    Time  12    Period  Weeks    Status  On-going    Target Date  05/20/18      OT LONG TERM GOAL  #9   Baseline  Pt. will write one sentence with 75% legibility and efficiently while maintaing grasp on pen 100% of the time.    Time  12    Period  Weeks    Status  On-going    Target Date  05/20/18      OT LONG TERM GOAL  #10   TITLE  Pt. will independently use his left hand to grasp and efficiently accomodate to the varying weights of ADL objects during self care tasks.     Baseline  04/08/2019: Pt. continues to work on grasping and adjusting hand to the varying weights of objects.    Time  12    Period  Weeks    Status  On-going    Target Date  05/20/18      OT LONG TERM GOAL  #11   TITLE  Pt. will improve [facility] skills to be able to independently manipulate items for ADLs/IADLs when UEs are elevated when reaching into the closet, or microwave.    Baseline  04/07/2018:  Pt. is progressing however continues to have difficulty  manipulating objects when UEs elevated     Time  12    Period  Weeks    Status  On-going    Target Date  05/20/18            Plan - 04/07/18 0948    Clinical Impression Statement Pt. reports concern about having a "crick" in his neck, which is similiar to the one prior to his stroke. Pt. has progressed with left hand grip strength, pinch strength, and [facility] skills reflective in the obtained measurements. Pt. presents with limited translatory movements, hand function skills, and [facility] skills in the left hand. Pt. continues to work on improving left grip strength, pinch strength, motor control, coordination, and overall UE functioning for improved performance with ADLs, and IADLs. Goals were reviewed with the patient.    Occupational performance deficits (Please refer to evaluation for details):  ADL's;IADL's    Rehab Potential  Excellent    OT Frequency  2x / week    OT Duration  12 weeks    OT Treatment/Interventions  Self-care/ADL training;Therapeutic exercise;Neuromuscular education;Patient/family education;Energy conservation;Therapeutic activities;DME and/or AE instruction    Clinical Decision Making  Several treatment options, min-mod task modification necessary    Consulted and Agree with Plan of Care  Patient       Patient will benefit from skilled therapeutic intervention in order to improve the following deficits and impairments:  Abnormal gait, Pain, Decreased strength, Decreased range of motion, Impaired UE functional use, Decreased coordination, Decreased knowledge of use of DME, Decreased activity tolerance, Difficulty walking, Decreased balance, Other (comment)  Visit Diagnosis: Muscle weakness (generalized)  Other lack of coordination    Problem List Patient Active Problem List   Diagnosis Date Noted  . History of stroke 02/24/2018  . Pure hypercholesterolemia 05/28/2017  . Erectile dysfunction 05/28/2017  . Left hemiparesis (HCC)   . Essential hypertension  11/29/2016  . 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 Hepatitis C 07/22/2013    Olegario MessierElaine Elber Galyean, MS, OTR/L 04/07/2018, 10:25 AM  [facility]IN [facility] [address] [place], [place], [address] Phone: [phone]   Fax:  [phone]  Name: Jason Nolan MRN: [id number] Date of Birth: 29-Jan-1946

## 2018-04-09 ENCOUNTER — Ambulatory Visit: Payer: Medicare Other | Admitting: Occupational Therapy

## 2018-04-09 ENCOUNTER — Encounter: Payer: Self-pay | Admitting: Occupational Therapy

## 2018-04-09 DIAGNOSIS — M6281 Muscle weakness (generalized): Secondary | ICD-10-CM

## 2018-04-09 DIAGNOSIS — R278 Other lack of coordination: Secondary | ICD-10-CM

## 2018-04-09 NOTE — Therapy (Signed)
[facility] [address] [place], [place], [address] Phone: [phone]   Fax:  [phone]  Occupational Therapy Treatment  Patient Details  Name: Jason CroakJames Nolan MRN: [id number] Date of Birth: August 18, 1945 No data recorded  Encounter Date: 04/09/2018  OT End of Session - 04/09/18 0948    Visit Number  90    Number of Visits  96    Date for OT Re-Evaluation  05/20/18    Authorization Type  Medicare1 of 10, reporting period starting 04/09/2018    OT Start Time  0933    OT Stop Time  1015    OT Time Calculation (min)  42 min    Activity Tolerance  Patient tolerated treatment well    Behavior During Therapy  [facility] for tasks assessed/performed       Past Medical History:  Diagnosis Date  . Glaucoma   . Hepatitis C   . Stroke [facility](HCC)     Past Surgical History:  Procedure Laterality Date  . IR ANGIO INTRA EXTRACRAN SEL COM CAROTID INNOMINATE UNI L MOD SED  11/25/2016  . IR ANGIO VERTEBRAL SEL SUBCLAVIAN INNOMINATE UNI R MOD SED  11/25/2016  . IR ANGIO VERTEBRAL SEL VERTEBRAL UNI L MOD SED  11/25/2016  . IR INTRAVSC STENT CERV CAROTID W/O EMB-PROT MOD SED INC ANGIO  11/25/2016  . IR PERCUTANEOUS ART THROMBECTOMY/INFUSION INTRACRANIAL INC DIAG ANGIO  11/25/2016  . IR RADIOLOGIST EVAL & MGMT  02/13/2017  . RADIOLOGY WITH ANESTHESIA N/A 11/25/2016   Procedure: RADIOLOGY WITH ANESTHESIA;  Surgeon: Radiologist, Medication, MD;  Location: [facility] OR;  Service: Radiology;  Laterality: N/A;    There were no vitals filed for this visit.  Subjective Assessment - 04/09/18 0942    Subjective   Pt. reports that he is still taking CBD oil gummies.    Patient is accompained by:  Family member    Pertinent History  Pt. is a 72 y.o. 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 [facility] He underwent CTA brain showed short segment near occlusion of proximal R-ICA with associated intraluminal thrombus --likely due to  dissection and emergent large vessel occlusion with right M2 occlusion and evolving right frontal lobe infarct.  He underwent cerebral angio with  complete revascularization of occluded superior division of R-MCA and near complete revascularization of proximal R-ICA with stent assisted angioplasty.  Stroke felt to be embolic in setting of ruptured plaque R-ICA and he was placed on ASA and brillinta due to stent. Pt. was transferred to inpatient rehab for several weeks, had home health therapy upon discharge, and now is ready for outpatient therapy services.    Currently in Pain?  Yes    Pain Score  4       OT TREATMENT    Neuro muscular re-education:  Pt. performed [facility] tasks using the Grooved pegboard. Pt. worked on grasping the grooved pegs from a horizontal position, and translatory skills moving the pegs to a vertical position in the hand to prepare for placing them in the grooved slot. Pt. required increased time to complete.Pt. had difficulty turning with pegs in the appropriate direction to fit the groove with the distal tip of his 2nd digit, and thumb. Pt. required cues to avoid compensating proximally in his LUE, and trunk. Pt. worked on removing the pegs while alternating thumb opposition to the tip of his 2nd though 5th digits.  OT Education - 04/09/18 [phone]    Education provided  Yes    Education Details  LUE functioning    Person(s) Educated  Patient    Methods  Explanation;Demonstration;Verbal cues    Comprehension  Verbalized understanding;Returned demonstration          OT Long Term Goals - 02/25/18 1546      OT LONG TERM GOAL #3   Title  Pt. left hand grip with  increase by 5# in preparation for opening a jar.    Baseline  02/25/2018: Left grip strength has improved by 12#. Pt. continues to have difficultyusing his left hand to open containers.      Time  12    Period  Weeks    Status  On-going    Target Date  05/20/18       OT LONG TERM GOAL #6   Title  Pt. will improve left hand coordination skills to be able to button clothing.    Baseline  02/25/2018: Pt. has improved with left hand coordination skills by 7 sec. of speed. Pt. continues to have difficulty with buttoning.    Time  12    Period  Weeks    Status  On-going    Target Date  05/20/18      OT LONG TERM GOAL #8   Title  Pt. will increase Left pinch strength to be able to grasp and hold items during ADL tasks.    Baseline  02/25/2018: Pt. continues to progress with  pinch strength, and ability to grasp, howeevr has difficulty holding objects.    Time  12    Period  Weeks    Status  On-going    Target Date  05/20/18      OT LONG TERM GOAL  #9   Baseline  Pt. will write one sentence with 75% legibility and efficiently while maintaing grasp on pen 100% of the time.    Time  12    Period  Weeks    Status  On-going    Target Date  05/20/18      OT LONG TERM GOAL  #10   TITLE  Pt. will independently use his left hand to grasp and efficiently accomodate to the varying weights of ADL objects during self care tasks.     Baseline  02/26/2019: Pt. continues to work on grasping and adjusting hand to the varying weights of objects.    Time  12    Period  Weeks    Status  On-going    Target Date  05/20/18      OT LONG TERM GOAL  #11   TITLE  Pt. will improve [facility] skills to be able to independently manipulate items for ADLs/IADLs when UEs are elevated when reaching into the closet, or microwave.    Baseline  02/25/2018: Pt. is progressing however continues to have difficulty manipulating objects when UEs elevated     Time  12    Period  Weeks    Status  On-going    Target Date  05/20/18            Plan - 04/09/18 0949    Clinical Impression Statement  Pt. is making progress overall with left hand strength, motor control, and coordination skills. Pt. present with increased compensation proximally in the LUE, and lateral leaning to the left with  [facility] skills. Pt. continues to work on translatory movements of the left hand, thumb movements, and motor control.  Occupational performance deficits (Please refer to evaluation for details):  ADL's;IADL's    Rehab Potential  Excellent    OT Frequency  2x / week    OT Duration  12 weeks    OT Treatment/Interventions  Self-care/ADL training;Therapeutic exercise;Neuromuscular education;Patient/family education;Energy conservation;Therapeutic activities;DME and/or AE instruction    Clinical Decision Making  Several treatment options, min-mod task modification necessary    Consulted and Agree with Plan of Care  Patient       Patient will benefit from skilled therapeutic intervention in order to improve the following deficits and impairments:  Abnormal gait, Pain, Decreased strength, Decreased range of motion, Impaired UE functional use, Decreased coordination, Decreased knowledge of use of DME, Decreased activity tolerance, Difficulty walking, Decreased balance, Other (comment)  Visit Diagnosis: Muscle weakness (generalized)  Other lack of coordination    Problem List Patient Active Problem List   Diagnosis Date Noted  . History of stroke 02/24/2018  . Pure hypercholesterolemia 05/28/2017  . Erectile dysfunction 05/28/2017  . Left hemiparesis (HCC)   . Essential hypertension 11/29/2016  . 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 Hepatitis C 07/22/2013    Olegario Messier, MS, OTR/L 04/09/2018, 9:58 AM  [facility] MAIN [facility] SERVICES [address] [place], [place], [address] Phone: [phone]   Fax:  [phone]  Name: Marissa Lowrey MRN: [id number] Date of Birth: Oct 02, 1945

## 2018-04-10 ENCOUNTER — Ambulatory Visit
Admission: RE | Admit: 2018-04-10 | Discharge: 2018-04-10 | Disposition: A | Discharge status: Home / Self Care | Payer: Medicare Other | Source: Ambulatory Visit | Attending: Gastroenterology | Admitting: Gastroenterology

## 2018-04-10 ENCOUNTER — Other Ambulatory Visit: Payer: Self-pay

## 2018-04-10 DIAGNOSIS — B1920 Unspecified viral hepatitis C without hepatic coma: Secondary | ICD-10-CM | POA: Insufficient documentation

## 2018-04-10 DIAGNOSIS — R768 Other specified abnormal immunological findings in serum: Secondary | ICD-10-CM

## 2018-04-10 DIAGNOSIS — K740 Hepatic fibrosis: Secondary | ICD-10-CM | POA: Diagnosis not present

## 2018-04-10 DIAGNOSIS — B182 Chronic viral hepatitis C: Secondary | ICD-10-CM | POA: Diagnosis not present

## 2018-04-10 DIAGNOSIS — R7689 Other specified abnormal immunological findings in serum: Secondary | ICD-10-CM

## 2018-04-13 ENCOUNTER — Ambulatory Visit (HOSPITAL_BASED_OUTPATIENT_CLINIC_OR_DEPARTMENT_OTHER): Payer: Medicare Other | Admitting: Physical Medicine & Rehabilitation

## 2018-04-13 ENCOUNTER — Encounter: Payer: Self-pay | Admitting: Physical Medicine & Rehabilitation

## 2018-04-13 ENCOUNTER — Encounter: Payer: Medicare Other | Attending: Physical Medicine & Rehabilitation

## 2018-04-13 VITALS — BP 146/88 | HR 80 | Ht 70.0 in | Wt 197.0 lb

## 2018-04-13 DIAGNOSIS — R51 Headache: Secondary | ICD-10-CM | POA: Insufficient documentation

## 2018-04-13 DIAGNOSIS — Z87891 Personal history of nicotine dependence: Secondary | ICD-10-CM | POA: Diagnosis not present

## 2018-04-13 DIAGNOSIS — G8194 Hemiplegia, unspecified affecting left nondominant side: Secondary | ICD-10-CM

## 2018-04-13 DIAGNOSIS — Z8619 Personal history of other infectious and parasitic diseases: Secondary | ICD-10-CM | POA: Insufficient documentation

## 2018-04-13 DIAGNOSIS — I69322 Dysarthria following cerebral infarction: Secondary | ICD-10-CM | POA: Diagnosis not present

## 2018-04-13 DIAGNOSIS — M542 Cervicalgia: Secondary | ICD-10-CM | POA: Diagnosis not present

## 2018-04-13 DIAGNOSIS — I639 Cerebral infarction, unspecified: Secondary | ICD-10-CM

## 2018-04-13 DIAGNOSIS — I69354 Hemiplegia and hemiparesis following cerebral infarction affecting left non-dominant side: Secondary | ICD-10-CM | POA: Diagnosis not present

## 2018-04-13 DIAGNOSIS — H409 Unspecified glaucoma: Secondary | ICD-10-CM | POA: Insufficient documentation

## 2018-04-13 DIAGNOSIS — Z8249 Family history of ischemic heart disease and other diseases of the circulatory system: Secondary | ICD-10-CM | POA: Diagnosis not present

## 2018-04-13 NOTE — Patient Instructions (Addendum)
Call for a neck xray order if the back of the  Neck Exercises Neck exercises can be important for many reasons:  They can help you to improve and maintain flexibility in your neck. This can be especially important as you age.  They can help to make your neck stronger. This can make movement easier.  They can reduce or prevent neck pain.  They may help your upper back.  Ask your health care provider which neck exercises would be best for you. Exercises Neck Press Repeat this exercise 10 times. Do it first thing in the morning and right before bed or as told by your health care provider. 1. Lie on your back on a firm bed or on the floor with a pillow under your head. 2. Use your neck muscles to push your head down on the pillow and straighten your spine. 3. Hold the position as well as you can. Keep your head facing up and your chin tucked. 4. Slowly count to 5 while holding this position. 5. Relax for a few seconds. Then repeat.  Isometric Strengthening Do a full set of these exercises 2 times a day or as told by your health care provider. 1. Sit in a supportive chair and place your hand on your forehead. 2. Push forward with your head and neck while pushing back with your hand. Hold for 10 seconds. 3. Relax. Then repeat the exercise 3 times. 4. Next, do thesequence again, this time putting your hand against the back of your head. Use your head and neck to push backward against the hand pressure. 5. Finally, do the same exercise on either side of your head, pushing sideways against the pressure of your hand.  Prone Head Lifts Repeat this exercise 5 times. Do this 2 times a day or as told by your health care provider. 1. Lie face-down, resting on your elbows so that your chest and upper back are raised. 2. Start with your head facing downward, near your chest. Position your chin either on or near your chest. 3. Slowly lift your head upward. Lift until you are looking straight ahead.  Then continue lifting your head as far back as you can stretch. 4. Hold your head up for 5 seconds. Then slowly lower it to your starting position.  Supine Head Lifts Repeat this exercise 8-10 times. Do this 2 times a day or as told by your health care provider. 1. Lie on your back, bending your knees to point to the ceiling and keeping your feet flat on the floor. 2. Lift your head slowly off the floor, raising your chin toward your chest. 3. Hold for 5 seconds. 4. Relax and repeat.  Scapular Retraction Repeat this exercise 5 times. Do this 2 times a day or as told by your health care provider. 1. Stand with your arms at your sides. Look straight ahead. 2. Slowly pull both shoulders backward and downward until you feel a stretch between your shoulder blades in your upper back. 3. Hold for 10-30 seconds. 4. Relax and repeat.  Contact a health care provider if:  Your neck pain or discomfort gets much worse when you do an exercise.  Your neck pain or discomfort does not improve within 2 hours after you exercise. If you have any of these problems, stop exercising right away. Do not do the exercises again unless your health care provider says that you can. Get help right away if:  You develop sudden, severe neck pain. If this happens,  stop exercising right away. Do not do the exercises again unless your health care provider says that you can. Exercises Neck Stretch  Repeat this exercise 3-5 times. 1. Do this exercise while standing or while sitting in a chair. 2. Place your feet flat on the floor, shoulder-width apart. 3. Slowly turn your head to the right. Turn it all the way to the right so you can look over your right shoulder. Do not tilt or tip your head. 4. Hold this position for 10-30 seconds. 5. Slowly turn your head to the left, to look over your left shoulder. 6. Hold this position for 10-30 seconds.  Neck Retraction Repeat this exercise 8-10 times. Do this 3-4 times a day  or as told by your health care provider. 1. Do this exercise while standing or while sitting in a sturdy chair. 2. Look straight ahead. Do not bend your neck. 3. Use your fingers to push your chin backward. Do not bend your neck for this movement. Continue to face straight ahead. If you are doing the exercise properly, you will feel a slight sensation in your throat and a stretch at the back of your neck. 4. Hold the stretch for 1-2 seconds. Relax and repeat.  This information is not intended to replace advice given to you by your health care provider. Make sure you discuss any questions you have with your health care provider. Document Released: 04/03/2015 Document Revised: 09/28/2015 Document Reviewed: 10/31/2014 Elsevier Interactive Patient Education  2018 [employer] head pain increases

## 2018-04-13 NOTE — Progress Notes (Signed)
Subjective:    Patient ID: Jason Nolan, male    DOB: March 30, 1946, 72 y.o.   MRN: [id number] 72 y.o. left handed male with history of Hep C-treated with Harvoni, glaucoma who was admitted via [facility] on 7/23 with left sided weakness, facial droop and slurred speech due to early changes of R-MCA territory infarct.  He underwent CTA brain showed short segment near occlusion of proximal R-ICA with associated intraluminal thrombus --likely due to dissection and emergent large vessel occlusion with right M2 occlusion and evolving right frontal lobe infarct.  He underwent cerebral angio with  complete revascularization of occluded superior division of R-MCA and near complete revascularization of proximal R-ICA with stent assisted angioplasty.  Stroke felt to be embolic in setting of ruptured plaque R-ICA and he was placed on ASA and brillinta due to stent.  Patient with resultant left sided weakness, expressive deficits with aphonia and dysphagia  HPI Chief complaint is headache. Topamax caused tingling and felt lethargic Now using CBD oil gummies  Patient's main complaint today is headache he describes this at the back of his head on the right side primarily.  He does note that it gets worse if he tilts his head toward the left side. He denies any neck trauma. He has no visual complaints with this. Pain Inventory Average Pain 0 Pain Right Now 0 My pain is na  In the last 24 hours, has pain interfered with the following? General activity 0 Relation with others 0 Enjoyment of life 0 What TIME of day is your pain at its worst? na Sleep (in general) Fair  Pain is worse with: na Pain improves with: na Relief from Meds: na  Mobility walk without assistance ability to climb steps?  yes do you drive?  yes  Function retired  Neuro/Psych No problems in this area  Prior Studies Any changes since last visit?  no  Physicians involved in your care Any changes since last visit?  no   Family  History  Problem Relation Age of Onset  . Hypertension Mother   . Hypertension Father    Social History   Socioeconomic History  . Marital status: Married    Spouse name: Not on file  . Number of children: Not on file  . Years of education: Not on file  . Highest education level: Not on file  Occupational History  . Not on file  Social Needs  . Financial resource strain: Not on file  . Food insecurity:    Worry: Not on file    Inability: Not on file  . Transportation needs:    Medical: Not on file    Non-medical: Not on file  Tobacco Use  . Smoking status: Former Smoker    Packs/day: 0.50    Years: 15.00    Pack years: 7.50    Last attempt to quit: 1990    Years since quitting: 29.9  . Smokeless tobacco: Never Used  Substance and Sexual Activity  . Alcohol use: No    Alcohol/week: 0.0 standard drinks  . Drug use: No  . Sexual activity: Yes    Partners: Female    Birth control/protection: None  Lifestyle  . Physical activity:    Days per week: Not on file    Minutes per session: Not on file  . Stress: Not on file  Relationships  . Social connections:    Talks on phone: Not on file    Gets together: Not on file    Attends religious  service: Not on file    Active member of club or organization: Not on file    Attends meetings of clubs or organizations: Not on file    Relationship status: Not on file  Other Topics Concern  . Not on file  Social History Narrative  . Not on file   Past Surgical History:  Procedure Laterality Date  . IR ANGIO INTRA EXTRACRAN SEL COM CAROTID INNOMINATE UNI L MOD SED  11/25/2016  . IR ANGIO VERTEBRAL SEL SUBCLAVIAN INNOMINATE UNI R MOD SED  11/25/2016  . IR ANGIO VERTEBRAL SEL VERTEBRAL UNI L MOD SED  11/25/2016  . IR INTRAVSC STENT CERV CAROTID W/O EMB-PROT MOD SED INC ANGIO  11/25/2016  . IR PERCUTANEOUS ART THROMBECTOMY/INFUSION INTRACRANIAL INC DIAG ANGIO  11/25/2016  . IR RADIOLOGIST EVAL & MGMT  02/13/2017  . RADIOLOGY WITH  ANESTHESIA N/A 11/25/2016   Procedure: RADIOLOGY WITH ANESTHESIA;  Surgeon: Radiologist, Medication, MD;  Location: [facility] OR;  Service: Radiology;  Laterality: N/A;   Past Medical History:  Diagnosis Date  . Glaucoma   . Hepatitis C   . Stroke (HCC)    BP (!) 146/88   Pulse 80   Ht 5\' 10"  (1.778 m)   Wt 197 lb (89.4 kg)   SpO2 97%   BMI 28.27 kg/m   Opioid Risk Score:   Fall Risk Score:  `1  Depression screen PHQ 2/9  Depression screen [facility] 2/9 12/11/2017 09/09/2017 05/28/2017 04/08/2017 12/31/2016  Decreased Interest 0 0 0 0 0  Down, Depressed, Hopeless 0 0 0 0 0  PHQ - 2 Score 0 0 0 0 0  Altered sleeping - - - - 0  Tired, decreased energy - - - - 0  Change in appetite - - - - 0  Feeling bad or failure about yourself  - - - - 0  Trouble concentrating - - - - 0  Moving slowly or fidgety/restless - - - - 0  Suicidal thoughts - - - - 0  PHQ-9 Score - - - - 0  Difficult doing work/chores - - - - Not difficult at all  Some recent data might be hidden     Review of Systems  Constitutional: Negative.   HENT: Negative.   Eyes: Negative.   Respiratory: Negative.   Cardiovascular: Negative.   Gastrointestinal: Negative.   Endocrine: Negative.   Genitourinary: Negative.   Musculoskeletal: Negative.   Skin: Negative.   Allergic/Immunologic: Negative.   Neurological: Negative.   Hematological: Negative.   Psychiatric/Behavioral: Negative.   All other systems reviewed and are negative.      Objective:   Physical Exam  Constitutional: He is oriented to person, place, and time. He appears well-developed and well-nourished. No distress.  HENT:  Head: Normocephalic and atraumatic.  Eyes: Pupils are equal, round, and reactive to light. EOM are normal.  Neck: Normal range of motion.  Neurological: He is alert and oriented to person, place, and time.  Skin: Skin is warm and dry. He is not diaphoretic.  Nursing note and vitals reviewed.  Neck shows good range of motion with  flexion extension lateral bending and rotation RIght sided neck pain in the upper cervical area when he tilts his head toward the left side.  There is no tenderness palpation along the paraspinal musculature or along the spinous processes. His motor strength is 5/5 in the right deltoid bicep tricep grip 4+/5 in the left deltoid bicep tricep and grip 5/5 bilateral hip flexor knee extensor ankle  dorsiflexor Ambulates without assistive device no evidence of toe drag or knee stability      Assessment & Plan:  1.  History of right MCA infarct with residual left upper extremity weakness as well as dysarthria.  Overall he is had an excellent functional recovery and he does continue to get some therapy for his left upper extremity weakness and incoordination 2.  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  No evidence of radiculopathy. We discussed getting cervical imaging however he does not wish to do this unless the pain gets worse. I will see the patient back in 6 months he is to call if his neck pain increases and we will order cervical spine imaging.

## 2018-04-14 ENCOUNTER — Telehealth: Payer: Self-pay

## 2018-04-14 ENCOUNTER — Ambulatory Visit: Payer: Medicare Other | Admitting: Occupational Therapy

## 2018-04-14 ENCOUNTER — Encounter: Payer: Self-pay | Admitting: Occupational Therapy

## 2018-04-14 DIAGNOSIS — B1920 Unspecified viral hepatitis C without hepatic coma: Secondary | ICD-10-CM | POA: Diagnosis not present

## 2018-04-14 DIAGNOSIS — R278 Other lack of coordination: Secondary | ICD-10-CM | POA: Diagnosis not present

## 2018-04-14 DIAGNOSIS — M6281 Muscle weakness (generalized): Secondary | ICD-10-CM | POA: Diagnosis not present

## 2018-04-14 DIAGNOSIS — R768 Other specified abnormal immunological findings in serum: Secondary | ICD-10-CM | POA: Diagnosis not present

## 2018-04-14 NOTE — Telephone Encounter (Signed)
-----   Message from Wyline MoodKiran Anna, MD sent at 04/08/2018 11:04 AM EST ----- Inform he is immune to hep A, likely has had hep B in the past , 2 more labs are missing   1. Hepatitis B surface antigen  2. Hepattiis B surface antibody   Can we have him get these labs - can come to our office

## 2018-04-14 NOTE — Telephone Encounter (Signed)
Spoke with pt and informed him of results and Dr. Johnney KillianAnna's instructions to come in for additional lab test. Pt agrees to come in for a lab visit this week.

## 2018-04-14 NOTE — Therapy (Signed)
[facility] MAIN [facility] SERVICES [address] [place], [place], [address] Phone: [phone]   Fax:  [phone]  Occupational Therapy Treatment  Patient Details  Name: Jason Nolan MRN: [id number] Date of Birth: [birth year] No data recorded  Encounter Date: 04/14/2018  OT End of Session - 04/14/18 0949    Visit Number  91    Number of Visits  96    Date for OT Re-Evaluation  05/20/18    Authorization Type  Medicare 2of 10, reporting period starting 04/09/2018    OT Start Time  0930    OT Stop Time  1015    OT Time Calculation (min)  45 min    Activity Tolerance  Patient tolerated treatment well    Behavior During Therapy  [facility] for tasks assessed/performed       Past Medical History:  Diagnosis Date  . Glaucoma   . Hepatitis C   . Stroke [facility])     Past Surgical History:  Procedure Laterality Date  . IR ANGIO INTRA EXTRACRAN SEL COM CAROTID INNOMINATE UNI L MOD SED  11/25/2016  . IR ANGIO VERTEBRAL SEL SUBCLAVIAN INNOMINATE UNI R MOD SED  11/25/2016  . IR ANGIO VERTEBRAL SEL VERTEBRAL UNI L MOD SED  11/25/2016  . IR INTRAVSC STENT CERV CAROTID W/O EMB-PROT MOD SED INC ANGIO  11/25/2016  . IR PERCUTANEOUS ART THROMBECTOMY/INFUSION INTRACRANIAL INC DIAG ANGIO  11/25/2016  . IR RADIOLOGIST EVAL & MGMT  02/13/2017  . RADIOLOGY WITH ANESTHESIA N/A 11/25/2016   Procedure: RADIOLOGY WITH ANESTHESIA;  Surgeon: Radiologist, Medication, MD;  Location: [facility] OR;  Service: Radiology;  Laterality: N/A;    There were no vitals filed for this visit.  Subjective Assessment - 04/14/18 0945    Patient is accompained by:  Family member    Pertinent History  Pt. is a 72 y.o. 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 Pt. was transferred to [facility]. He underwent CTA brain showed short segment near occlusion of proximal R-ICA with associated intraluminal thrombus --likely due to dissection and emergent large vessel occlusion with right M2 occlusion  and evolving right frontal lobe infarct.  He underwent cerebral angio with  complete revascularization of occluded superior division of R-MCA and near complete revascularization of proximal R-ICA with stent assisted angioplasty.  Stroke felt to be embolic in setting of ruptured plaque R-ICA and he was placed on ASA and brillinta due to stent. Pt. was transferred to inpatient rehab for several weeks, had home health therapy upon discharge, and now is ready for outpatient therapy services.    Limitations  Dominant LUE functioning, left hand edema, distal ROM, coordination, and overal strength.    Patient Stated Goals  To regain the use of his LUE.    Currently in Pain?  No/denies      OT TREATMENT    Neuro muscular re-education:  Pt. worked on left hand [facility] skills grasping 1" pegs on the [employer] Pegboard. Pt. worked on translatory movements of the hand moving objects from his palm to the tip of his 2nd digit and thumb.     Selfcare:  Pt. worked on Estate manager/land agent legibly in print form with 90% legible. and minimal deviation from the line using unlined paper.                        OT Education - 04/14/18 [phone]    Education provided  Yes    Education Details  LUE functioning    Person(s) Educated  Patient    Methods  Explanation;Demonstration;Verbal cues    Comprehension  Verbalized understanding;Returned demonstration          OT Long Term Goals - 02/25/18 1546      OT LONG TERM GOAL #3   Title  Pt. left hand grip with  increase by 5# in preparation for opening a jar.    Baseline  02/25/2018: Left grip strength has improved by 12#. Pt. continues to have difficultyusing his left hand to open containers.      Time  12    Period  Weeks    Status  On-going    Target Date  05/20/18      OT LONG TERM GOAL #6   Title  Pt. will improve left hand coordination skills to be able to button clothing.    Baseline  02/25/2018: Pt. has improved with left hand coordination  skills by 7 sec. of speed. Pt. continues to have difficulty with buttoning.    Time  12    Period  Weeks    Status  On-going    Target Date  05/20/18      OT LONG TERM GOAL #8   Title  Pt. will increase Left pinch strength to be able to grasp and hold items during ADL tasks.    Baseline  02/25/2018: Pt. continues to progress with  pinch strength, and ability to grasp, howeevr has difficulty holding objects.    Time  12    Period  Weeks    Status  On-going    Target Date  05/20/18      OT LONG TERM GOAL  #9   Baseline  Pt. will write one sentence with 75% legibility and efficiently while maintaing grasp on pen 100% of the time.    Time  12    Period  Weeks    Status  On-going    Target Date  05/20/18      OT LONG TERM GOAL  #10   TITLE  Pt. will independently use his left hand to grasp and efficiently accomodate to the varying weights of ADL objects during self care tasks.     Baseline  02/26/2019: Pt. continues to work on grasping and adjusting hand to the varying weights of objects.    Time  12    Period  Weeks    Status  On-going    Target Date  05/20/18      OT LONG TERM GOAL  #11   TITLE  [facility]. will improve [facility] skills to be able to independently manipulate items for ADLs/IADLs when UEs are elevated when reaching into the closet, or microwave.    Baseline  02/25/2018: Pt. is progressing however continues to have difficulty manipulating objects when UEs elevated     Time  12    Period  Weeks    Status  On-going    Target Date  05/20/18            Plan - 04/14/18 0950    Clinical Impression Statement  Pt. is making progress overall with UE his LUE, and hand strength, motor control, [facility] skills and translatory movements. Pt. requires cues to to avoid compensating proximally. Pt. conitnues to work on hand function skills for iproved ADL, and IADL functioning.    Occupational performance deficits (Please refer to evaluation for details):  ADL's;IADL's    Rehab Potential   Excellent    OT Frequency  2x /  week    OT Duration  12 weeks    OT Treatment/Interventions  Self-care/ADL training;Therapeutic exercise;Neuromuscular education;Patient/family education;Energy conservation;Therapeutic activities;DME and/or AE instruction    Clinical Decision Making  Several treatment options, min-mod task modification necessary    Consulted and Agree with Plan of Care  Patient       Patient will benefit from skilled therapeutic intervention in order to improve the following deficits and impairments:  Abnormal gait, Pain, Decreased strength, Decreased range of motion, Impaired UE functional use, Decreased coordination, Decreased knowledge of use of DME, Decreased activity tolerance, Difficulty walking, Decreased balance, Other (comment)  Visit Diagnosis: Muscle weakness (generalized)  Other lack of coordination    Problem List Patient Active Problem List   Diagnosis Date Noted  . History of stroke 02/24/2018  . Pure hypercholesterolemia 05/28/2017  . Erectile dysfunction 05/28/2017  . Left hemiparesis (HCC)   . Essential hypertension 11/29/2016  . 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 Hepatitis C 07/22/2013    Olegario Messier, MS, OTR/L 04/14/2018, 10:11 AM  [place] [facility] MAIN [facility] SERVICES [address] [place], [place], [address] Phone: [phone]   Fax:  [phone]  Name: Jason Nolan MRN: [id number] Date of Birth: April 13, 1946

## 2018-04-15 ENCOUNTER — Encounter: Payer: Self-pay | Admitting: Gastroenterology

## 2018-04-15 LAB — HEPATITIS B SURFACE ANTIGEN: HEP B S AG: NEGATIVE

## 2018-04-15 LAB — HEPATITIS B SURFACE ANTIBODY,QUALITATIVE: Hep B Surface Ab, Qual: REACTIVE

## 2018-04-16 ENCOUNTER — Ambulatory Visit: Payer: Medicare Other | Admitting: Occupational Therapy

## 2018-04-16 ENCOUNTER — Encounter: Payer: Self-pay | Admitting: Occupational Therapy

## 2018-04-16 DIAGNOSIS — M6281 Muscle weakness (generalized): Secondary | ICD-10-CM

## 2018-04-16 DIAGNOSIS — R278 Other lack of coordination: Secondary | ICD-10-CM | POA: Diagnosis not present

## 2018-04-16 NOTE — Therapy (Signed)
[facility] [address] [place], [place], [address] Phone: [phone]   Fax:  [phone]  Occupational Therapy Treatment  Patient Details  Name: Jason CroakJames Nolan MRN: [id number] Date of Birth: [birth year] No data recorded  Encounter Date: 04/16/2018  OT End of Session - 04/16/18 0945    Visit Number  92    Number of Visits  96    Date for OT Re-Evaluation  05/20/18    Authorization Type  Medicare 3 of 10, reporting period starting 04/09/2018    OT Start Time  0930    OT Stop Time  1015    OT Time Calculation (min)  45 min    Activity Tolerance  Patient tolerated treatment well    Behavior During Therapy  [facility] for tasks assessed/performed       Past Medical History:  Diagnosis Date  . Glaucoma   . Hepatitis C   . Stroke [facility](HCC)     Past Surgical History:  Procedure Laterality Date  . IR ANGIO INTRA EXTRACRAN SEL COM CAROTID INNOMINATE UNI L MOD SED  11/25/2016  . IR ANGIO VERTEBRAL SEL SUBCLAVIAN INNOMINATE UNI R MOD SED  11/25/2016  . IR ANGIO VERTEBRAL SEL VERTEBRAL UNI L MOD SED  11/25/2016  . IR INTRAVSC STENT CERV CAROTID W/O EMB-PROT MOD SED INC ANGIO  11/25/2016  . IR PERCUTANEOUS ART THROMBECTOMY/INFUSION INTRACRANIAL INC DIAG ANGIO  11/25/2016  . IR RADIOLOGIST EVAL & MGMT  02/13/2017  . RADIOLOGY WITH ANESTHESIA N/A 11/25/2016   Procedure: RADIOLOGY WITH ANESTHESIA;  Surgeon: Radiologist, Medication, MD;  Location: [facility] OR;  Service: Radiology;  Laterality: N/A;    There were no vitals filed for this visit.  Subjective Assessment - 04/16/18 0940    Subjective   Pt. reports that he is still taking CBD oil gummies.    Patient is accompained by:  Family member    Pertinent History  Pt. is a 72 y.o. 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 [facility] He underwent CTA brain showed short segment near occlusion of proximal R-ICA with associated intraluminal thrombus --likely due to  dissection and emergent large vessel occlusion with right M2 occlusion and evolving right frontal lobe infarct.  He underwent cerebral angio with  complete revascularization of occluded superior division of R-MCA and near complete revascularization of proximal R-ICA with stent assisted angioplasty.  Stroke felt to be embolic in setting of ruptured plaque R-ICA and he was placed on ASA and brillinta due to stent. Pt. was transferred to inpatient rehab for several weeks, had home health therapy upon discharge, and now is ready for outpatient therapy services.    Limitations  Dominant LUE functioning, left hand edema, distal ROM, coordination, and overal strength.    Currently in Pain?  No/denies    Pain Score  0-No pain      OT TREATMENT    Neuro muscular re-education:  Pt. worked on using his left hand for grasping, and manipulating 1/2" washers from a magnetic dish using point grasp pattern, and performing translatory movements of his hand. Pt. worked on reaching up, stabilizing, and sustaining shoulder elevation while placing the washer over a small precise target on vertical dowels positioned at various angles. Pt. worked on using the left hand for grasping pegs, moving them through his hand, and positioning magnetic hooks on a whiteboard positioned at a vertical angle on the tabletop. Pt. worked on [facility] skills grasping 1/2" flat washers,  and placing them on the hooks.                         OT Education - 04/16/18 0945    Education provided  Yes    Education Details  LUE functioning    Person(s) Educated  Patient    Methods  Explanation;Demonstration;Verbal cues    Comprehension  Verbalized understanding;Returned demonstration          OT Long Term Goals - 02/25/18 1546      OT LONG TERM GOAL #3   Title  Pt. left hand grip with  increase by 5# in preparation for opening a jar.    Baseline  02/25/2018: Left grip strength has improved by 12#. Pt. continues to have  difficultyusing his left hand to open containers.      Time  12    Period  Weeks    Status  On-going    Target Date  05/20/18      OT LONG TERM GOAL #6   Title  Pt. will improve left hand coordination skills to be able to button clothing.    Baseline  02/25/2018: Pt. has improved with left hand coordination skills by 7 sec. of speed. Pt. continues to have difficulty with buttoning.    Time  12    Period  Weeks    Status  On-going    Target Date  05/20/18      OT LONG TERM GOAL #8   Title  Pt. will increase Left pinch strength to be able to grasp and hold items during ADL tasks.    Baseline  02/25/2018: Pt. continues to progress with  pinch strength, and ability to grasp, howeevr has difficulty holding objects.    Time  12    Period  Weeks    Status  On-going    Target Date  05/20/18      OT LONG TERM GOAL  #9   Baseline  Pt. will write one sentence with 75% legibility and efficiently while maintaing grasp on pen 100% of the time.    Time  12    Period  Weeks    Status  On-going    Target Date  05/20/18      OT LONG TERM GOAL  #10   TITLE  Pt. will independently use his left hand to grasp and efficiently accomodate to the varying weights of ADL objects during self care tasks.     Baseline  02/26/2019: Pt. continues to work on grasping and adjusting hand to the varying weights of objects.    Time  12    Period  Weeks    Status  On-going    Target Date  05/20/18      OT LONG TERM GOAL  #11   TITLE  Pt. will improve [facility] skills to be able to independently manipulate items for ADLs/IADLs when UEs are elevated when reaching into the closet, or microwave.    Baseline  02/25/2018: Pt. is progressing however continues to have difficulty manipulating objects when UEs elevated     Time  12    Period  Weeks    Status  On-going    Target Date  05/20/18            Plan - 04/16/18 0948    Clinical Impression Statement  Pt. reports he tried to play pool at his friend's home, and  had a hard time abducting his shoulder smoothly when using  the stick. Pt. presents with impaired translatory skills. Pt. continues to work on improving left hand [facility] skills, translatory movements of the hand, and hand function.  Pt. continues to work on improving overall UE functioning during ADLs, and IADL tasks.     Occupational performance deficits (Please refer to evaluation for details):  ADL's;IADL's    Rehab Potential  Excellent    OT Frequency  2x / week    OT Duration  12 weeks    OT Treatment/Interventions  Self-care/ADL training;Therapeutic exercise;Neuromuscular education;Patient/family education;Energy conservation;Therapeutic activities;DME and/or AE instruction    Clinical Decision Making  Several treatment options, min-mod task modification necessary    Consulted and Agree with Plan of Care  Patient       Patient will benefit from skilled therapeutic intervention in order to improve the following deficits and impairments:  Abnormal gait, Pain, Decreased strength, Decreased range of motion, Impaired UE functional use, Decreased coordination, Decreased knowledge of use of DME, Decreased activity tolerance, Difficulty walking, Decreased balance, Other (comment)  Visit Diagnosis: Muscle weakness (generalized)    Problem List Patient Active Problem List   Diagnosis Date Noted  . History of stroke 02/24/2018  . Pure hypercholesterolemia 05/28/2017  . Erectile dysfunction 05/28/2017  . Left hemiparesis (HCC)   . Essential hypertension 11/29/2016  . 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 Hepatitis C 07/22/2013    Olegario Messier, MS, OTR/L 04/16/2018, 10:07 AM  [place] [facility] MAIN [facility] SERVICES [address] [place], [place], [address] Phone: [phone]   Fax:  [phone]  Name: [place] Nolan MRN: [id number] Date of Birth:  1945-07-09

## 2018-04-21 ENCOUNTER — Encounter: Payer: Self-pay | Admitting: Occupational Therapy

## 2018-04-21 ENCOUNTER — Ambulatory Visit: Payer: Medicare Other | Admitting: Occupational Therapy

## 2018-04-21 DIAGNOSIS — R278 Other lack of coordination: Secondary | ICD-10-CM

## 2018-04-21 DIAGNOSIS — M6281 Muscle weakness (generalized): Secondary | ICD-10-CM | POA: Diagnosis not present

## 2018-04-21 NOTE — Therapy (Signed)
[place] [facility] MAIN [facility] SERVICES [address] [place], [place], [address] Phone: [phone]   Fax:  [phone]  Occupational Therapy Treatment  Patient Details  Name: [place] Nolan MRN: [id number] Date of Birth: [birth year] No data recorded  Encounter Date: 04/21/2018  OT End of Session - 04/21/18 0940    Visit Number  93    Number of Visits  96    Date for OT Re-Evaluation  05/20/18    Authorization Type  Medicare 4 of 10, reporting period starting 04/09/2018    OT Start Time  0930    OT Stop Time  1015    OT Time Calculation (min)  45 min    Activity Tolerance  Patient tolerated treatment well    Behavior During Therapy  [facility] for tasks assessed/performed       Past Medical History:  Diagnosis Date  . Glaucoma   . Hepatitis C   . Stroke [facility])     Past Surgical History:  Procedure Laterality Date  . IR ANGIO INTRA EXTRACRAN SEL COM CAROTID INNOMINATE UNI L MOD SED  11/25/2016  . IR ANGIO VERTEBRAL SEL SUBCLAVIAN INNOMINATE UNI R MOD SED  11/25/2016  . IR ANGIO VERTEBRAL SEL VERTEBRAL UNI L MOD SED  11/25/2016  . IR INTRAVSC STENT CERV CAROTID W/O EMB-PROT MOD SED INC ANGIO  11/25/2016  . IR PERCUTANEOUS ART THROMBECTOMY/INFUSION INTRACRANIAL INC DIAG ANGIO  11/25/2016  . IR RADIOLOGIST EVAL & MGMT  02/13/2017  . RADIOLOGY WITH ANESTHESIA N/A 11/25/2016   Procedure: RADIOLOGY WITH ANESTHESIA;  Surgeon: Radiologist, Medication, MD;  Location: [facility] OR;  Service: Radiology;  Laterality: N/A;    There were no vitals filed for this visit.  Subjective Assessment - 04/21/18 0939    Subjective   Pt. reports he still gets headaches faintly.    Patient is accompained by:  Family member    Pertinent History  Pt. is a 72 y.o. 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 Pt. was transferred to [facility]. He underwent CTA brain showed short segment near occlusion of proximal R-ICA with associated intraluminal thrombus --likely due to  dissection and emergent large vessel occlusion with right M2 occlusion and evolving right frontal lobe infarct.  He underwent cerebral angio with  complete revascularization of occluded superior division of R-MCA and near complete revascularization of proximal R-ICA with stent assisted angioplasty.  Stroke felt to be embolic in setting of ruptured plaque R-ICA and he was placed on ASA and brillinta due to stent. Pt. was transferred to inpatient rehab for several weeks, had home health therapy upon discharge, and now is ready for outpatient therapy services.    Limitations  Dominant LUE functioning, left hand edema, distal ROM, coordination, and overal strength.    Patient Stated Goals  To regain the use of his LUE.    Currently in Pain?  No/denies      OT TREATMENT    Neuro muscular re-education:  Pt. worked on [facility] grasping 1/2" small magnetic pegs, disconnecting them, and placing them on a positioned at a vertical angle at a tabletop, on an elevated angle to encourage [facility] skills with arms sustained in elevation. Pt. dropped fewer items. Pt. worked on removing the pegs while alternating thumb opposition to the tip of the 2nd through 5th digits.  Pt. worked on Diplomatic Services operational officer tasks, and Data processing manager in print form, and handwriting. Pt. requires increased time to complete.  OT Education - 04/21/18 0940    Education provided  Yes    Education Details  LUE functioning    Person(s) Educated  Patient    Methods  Explanation;Demonstration;Verbal cues    Comprehension  Verbalized understanding;Returned demonstration          OT Long Term Goals - 02/25/18 1546      OT LONG TERM GOAL #3   Title  Pt. left hand grip with  increase by 5# in preparation for opening a jar.    Baseline  02/25/2018: Left grip strength has improved by 12#. Pt. continues to have difficultyusing his left hand to open containers.      Time  12    Period  Weeks    Status   On-going    Target Date  05/20/18      OT LONG TERM GOAL #6   Title  Pt. will improve left hand coordination skills to be able to button clothing.    Baseline  02/25/2018: Pt. has improved with left hand coordination skills by 7 sec. of speed. Pt. continues to have difficulty with buttoning.    Time  12    Period  Weeks    Status  On-going    Target Date  05/20/18      OT LONG TERM GOAL #8   Title  Pt. will increase Left pinch strength to be able to grasp and hold items during ADL tasks.    Baseline  02/25/2018: Pt. continues to progress with  pinch strength, and ability to grasp, howeevr has difficulty holding objects.    Time  12    Period  Weeks    Status  On-going    Target Date  05/20/18      OT LONG TERM GOAL  #9   Baseline  Pt. will write one sentence with 75% legibility and efficiently while maintaing grasp on pen 100% of the time.    Time  12    Period  Weeks    Status  On-going    Target Date  05/20/18      OT LONG TERM GOAL  #10   TITLE  Pt. will independently use his left hand to grasp and efficiently accomodate to the varying weights of ADL objects during self care tasks.     Baseline  02/26/2019: Pt. continues to work on grasping and adjusting hand to the varying weights of objects.    Time  12    Period  Weeks    Status  On-going    Target Date  05/20/18      OT LONG TERM GOAL  #11   TITLE  Pt. will improve [facility] skills to be able to independently manipulate items for ADLs/IADLs when UEs are elevated when reaching into the closet, or microwave.    Baseline  02/25/2018: Pt. is progressing however continues to have difficulty manipulating objects when UEs elevated     Time  12    Period  Weeks    Status  On-going    Target Date  05/20/18            Plan - 04/21/18 0941    Clinical Impression Statement Pt. reports that he is feeling better, and is improving with using his left hand more during tasks at home. Pt. continues to present impaired left hand  function, translatory movements of the hand, and [facility] skills. Pt. continues to work on improving LUE strength, and [facility] skills in order to use his hand  during ADL, and IADL tasks.     Occupational performance deficits (Please refer to evaluation for details):  ADL's;IADL's    Rehab Potential  Excellent    OT Frequency  2x / week    OT Duration  12 weeks    OT Treatment/Interventions  Self-care/ADL training;Therapeutic exercise;Neuromuscular education;Patient/family education;Energy conservation;Therapeutic activities;DME and/or AE instruction    Clinical Decision Making  Several treatment options, min-mod task modification necessary    Consulted and Agree with Plan of Care  Patient       Patient will benefit from skilled therapeutic intervention in order to improve the following deficits and impairments:  Abnormal gait, Pain, Decreased strength, Decreased range of motion, Impaired UE functional use, Decreased coordination, Decreased knowledge of use of DME, Decreased activity tolerance, Difficulty walking, Decreased balance, Other (comment)  Visit Diagnosis: Muscle weakness (generalized)  Other lack of coordination    Problem List Patient Active Problem List   Diagnosis Date Noted  . History of stroke 02/24/2018  . Pure hypercholesterolemia 05/28/2017  . Erectile dysfunction 05/28/2017  . Left hemiparesis (HCC)   . Essential hypertension 11/29/2016  . 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 Hepatitis C 07/22/2013    Olegario MessierElaine Omer Monter, MS, OTR/L 04/21/2018, 10:13 AM  [place] [facility] MAIN [facility] [address] [place], [place], [address] Phone: [phone]   Fax:  [phone]  Name: [place] Nolan MRN: [id number] Date of Birth: 1945/10/20

## 2018-04-23 ENCOUNTER — Encounter: Payer: Self-pay | Admitting: Occupational Therapy

## 2018-04-23 ENCOUNTER — Ambulatory Visit: Payer: Medicare Other | Admitting: Occupational Therapy

## 2018-04-23 ENCOUNTER — Telehealth: Payer: Self-pay | Admitting: Gastroenterology

## 2018-04-23 DIAGNOSIS — R278 Other lack of coordination: Secondary | ICD-10-CM | POA: Diagnosis not present

## 2018-04-23 DIAGNOSIS — M6281 Muscle weakness (generalized): Secondary | ICD-10-CM

## 2018-04-23 NOTE — Therapy (Signed)
[place] [facility] [address] [place], [place], [address] Phone: [phone]   Fax:  [phone]  Occupational Therapy Treatment  Patient Details  Name: Jason Nolan MRN: [id number] Date of Birth: 1945/10/28 No data recorded  Encounter Date: 04/23/2018  OT End of Session - 04/23/18 0941    Visit Number  94    Number of Visits  96    Date for OT Re-Evaluation  05/20/18    Authorization Type  Medicare 5 of 10, reporting period starting 04/09/2018    OT Start Time  0930    OT Stop Time  1015    OT Time Calculation (min)  45 min    Activity Tolerance  Patient tolerated treatment well    Behavior During Therapy  [facility] for tasks assessed/performed       Past Medical History:  Diagnosis Date  . Glaucoma   . Hepatitis C   . Stroke [facility](HCC)     Past Surgical History:  Procedure Laterality Date  . IR ANGIO INTRA EXTRACRAN SEL COM CAROTID INNOMINATE UNI L MOD SED  11/25/2016  . IR ANGIO VERTEBRAL SEL SUBCLAVIAN INNOMINATE UNI R MOD SED  11/25/2016  . IR ANGIO VERTEBRAL SEL VERTEBRAL UNI L MOD SED  11/25/2016  . IR INTRAVSC STENT CERV CAROTID W/O EMB-PROT MOD SED INC ANGIO  11/25/2016  . IR PERCUTANEOUS ART THROMBECTOMY/INFUSION INTRACRANIAL INC DIAG ANGIO  11/25/2016  . IR RADIOLOGIST EVAL & MGMT  02/13/2017  . RADIOLOGY WITH ANESTHESIA N/A 11/25/2016   Procedure: RADIOLOGY WITH ANESTHESIA;  Surgeon: Radiologist, Medication, MD;  Location: [facility] OR;  Service: Radiology;  Laterality: N/A;    There were no vitals filed for this visit.  Subjective Assessment - 04/23/18 0940    Subjective   Pt. reports he still gets headaches faintly.    Patient is accompained by:  Family member    Pertinent History  Pt. is a 72 y.o. 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 [facility] He underwent CTA brain showed short segment near occlusion of proximal R-ICA with associated intraluminal thrombus --likely due to  dissection and emergent large vessel occlusion with right M2 occlusion and evolving right frontal lobe infarct.  He underwent cerebral angio with  complete revascularization of occluded superior division of R-MCA and near complete revascularization of proximal R-ICA with stent assisted angioplasty.  Stroke felt to be embolic in setting of ruptured plaque R-ICA and he was placed on ASA and brillinta due to stent. Pt. was transferred to inpatient rehab for several weeks, had home health therapy upon discharge, and now is ready for outpatient therapy services.    Limitations  Dominant LUE functioning, left hand edema, distal ROM, coordination, and overal strength.    Patient Stated Goals  To regain the use of his LUE.    Currently in Pain?  No/denies       OT TREATMENT    Neuro muscular re-education:  Pt. worked on grasping, and manipulating 1/2" washers from a magnetic dish using point grasp pattern. Pt. worked on reaching up, stabilizing, and sustaining shoulder elevation while placing the washer over a small precise target on vertical dowels positioned at various angles.  Pt. performed [facility] tasks using the Grooved pegboard. Pt. worked on grasping the grooved pegs from a horizontal position, and moving the pegs to a vertical position in the hand to prepare for placing them in the grooved slot. Pt. Had difficulty turning the pegs in  his hand to place them with the proper direction on the pegboard.                          OT Education - 04/23/18 0941    Education provided  Yes    Education Details  LUE functioning    Person(s) Educated  Patient    Methods  Explanation;Demonstration;Verbal cues    Comprehension  Verbalized understanding;Returned demonstration          OT Long Term Goals - 02/25/18 1546      OT LONG TERM GOAL #3   Title  Pt. left hand grip with  increase by 5# in preparation for opening a jar.    Baseline  02/25/2018: Left grip strength has improved by  12#. Pt. continues to have difficultyusing his left hand to open containers.      Time  12    Period  Weeks    Status  On-going    Target Date  05/20/18      OT LONG TERM GOAL #6   Title  Pt. will improve left hand coordination skills to be able to button clothing.    Baseline  02/25/2018: Pt. has improved with left hand coordination skills by 7 sec. of speed. Pt. continues to have difficulty with buttoning.    Time  12    Period  Weeks    Status  On-going    Target Date  05/20/18      OT LONG TERM GOAL #8   Title  Pt. will increase Left pinch strength to be able to grasp and hold items during ADL tasks.    Baseline  02/25/2018: Pt. continues to progress with  pinch strength, and ability to grasp, howeevr has difficulty holding objects.    Time  12    Period  Weeks    Status  On-going    Target Date  05/20/18      OT LONG TERM GOAL  #9   Baseline  Pt. will write one sentence with 75% legibility and efficiently while maintaing grasp on pen 100% of the time.    Time  12    Period  Weeks    Status  On-going    Target Date  05/20/18      OT LONG TERM GOAL  #10   TITLE  Pt. will independently use his left hand to grasp and efficiently accomodate to the varying weights of ADL objects during self care tasks.     Baseline  02/26/2019: Pt. continues to work on grasping and adjusting hand to the varying weights of objects.    Time  12    Period  Weeks    Status  On-going    Target Date  05/20/18      OT LONG TERM GOAL  #11   TITLE  Pt. will improve [facility] skills to be able to independently manipulate items for ADLs/IADLs when UEs are elevated when reaching into the closet, or microwave.    Baseline  02/25/2018: Pt. is progressing however continues to have difficulty manipulating objects when UEs elevated     Time  12    Period  Weeks    Status  On-going    Target Date  05/20/18            Plan - 04/23/18 0945    Clinical Impression Statement  Pt. continues to report persistent  headaches, however the intensity of the headaches have been more  mild. Pt. has difficulty using his left hand to open containers, and perform writing tasks.  Pt. continues compensate proximately with leaning to the right when performing [facility] skills with the left. Pt. continues to present with limited LUE strength, and [facility] skills with emphasis on translatory movements of the hand.     Occupational performance deficits (Please refer to evaluation for details):  ADL's;IADL's    Rehab Potential  Excellent    OT Frequency  2x / week    OT Duration  12 weeks    OT Treatment/Interventions  Self-care/ADL training;Therapeutic exercise;Neuromuscular education;Patient/family education;Energy conservation;Therapeutic activities;DME and/or AE instruction    Clinical Decision Making  Several treatment options, min-mod task modification necessary    Consulted and Agree with Plan of Care  Patient       Patient will benefit from skilled therapeutic intervention in order to improve the following deficits and impairments:  Abnormal gait, Pain, Decreased strength, Decreased range of motion, Impaired UE functional use, Decreased coordination, Decreased knowledge of use of DME, Decreased activity tolerance, Difficulty walking, Decreased balance, Other (comment)  Visit Diagnosis: Muscle weakness (generalized)  Other lack of coordination    Problem List Patient Active Problem List   Diagnosis Date Noted  . History of stroke 02/24/2018  . Pure hypercholesterolemia 05/28/2017  . Erectile dysfunction 05/28/2017  . Left hemiparesis (HCC)   . Essential hypertension 11/29/2016  . 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 Hepatitis C 07/22/2013    Olegario MessierElaine Catlyn Shipton, MS, OTR/L 04/23/2018, 11:17 AM  [place] [facility] MAIN [facility] [address] [place], [place],  [address] Phone: [phone]   Fax:  [phone]  Name: Jason Nolan MRN: [id number] Date of Birth: 1945-10-11

## 2018-04-23 NOTE — Telephone Encounter (Signed)
Pt left vm he misplaced his rx for procedure he wants to know if we can rewrite it and he can pick it up

## 2018-04-24 NOTE — Telephone Encounter (Signed)
Pt stopped by our office to pick up the Suprep script.

## 2018-04-28 ENCOUNTER — Ambulatory Visit: Payer: Medicare Other | Admitting: Occupational Therapy

## 2018-04-28 ENCOUNTER — Encounter: Payer: Self-pay | Admitting: Occupational Therapy

## 2018-04-28 DIAGNOSIS — M6281 Muscle weakness (generalized): Secondary | ICD-10-CM | POA: Diagnosis not present

## 2018-04-28 DIAGNOSIS — R278 Other lack of coordination: Secondary | ICD-10-CM | POA: Diagnosis not present

## 2018-04-28 NOTE — Therapy (Signed)
[place] [facility] MAIN [facility] [address] [place], [place], [address] Phone: [phone]   Fax:  [phone]  Occupational Therapy Treatment  Patient Details  Name: Jason CroakJames Nolan MRN: [id number] Date of Birth: Apr 17, 1946 No data recorded  Encounter Date: 04/28/2018  OT End of Session - 04/28/18 0941    Visit Number  95    Number of Visits  96    Date for OT Re-Evaluation  05/20/18    Authorization Type  Medicare 6 of 10, reporting period starting 04/09/2018    OT Start Time  0930    OT Stop Time  1015    OT Time Calculation (min)  45 min    Activity Tolerance  Patient tolerated treatment well    Behavior During Therapy  [facility] for tasks assessed/performed       Past Medical History:  Diagnosis Date  . Glaucoma   . Hepatitis C   . Stroke [facility](HCC)     Past Surgical History:  Procedure Laterality Date  . IR ANGIO INTRA EXTRACRAN SEL COM CAROTID INNOMINATE UNI L MOD SED  11/25/2016  . IR ANGIO VERTEBRAL SEL SUBCLAVIAN INNOMINATE UNI R MOD SED  11/25/2016  . IR ANGIO VERTEBRAL SEL VERTEBRAL UNI L MOD SED  11/25/2016  . IR INTRAVSC STENT CERV CAROTID W/O EMB-PROT MOD SED INC ANGIO  11/25/2016  . IR PERCUTANEOUS ART THROMBECTOMY/INFUSION INTRACRANIAL INC DIAG ANGIO  11/25/2016  . IR RADIOLOGIST EVAL & MGMT  02/13/2017  . RADIOLOGY WITH ANESTHESIA N/A 11/25/2016   Procedure: RADIOLOGY WITH ANESTHESIA;  Surgeon: Radiologist, Medication, MD;  Location: [facility] OR;  Service: Radiology;  Laterality: N/A;    There were no vitals filed for this visit.  Subjective Assessment - 04/28/18 0936    Currently in Pain?  No/denies      OT TREATMENT    Neuro muscular re-education:  Pt. worked on [facility] skills grasping, and manipulating bolts on a vertical tower. Pt. worked on unscrewing the nuts with the left hand while stabilizing the bolts with the right hand. Pt. worked on left hand [facility] skills, grasping and manipulating 1/4" small pegs. Pt. worked on  translatory movements of the hand moving the objects from the palm of his hand to the tip of his 2nd, and thumb in preparation for placing them into the pegboard..                       OT Education - 04/28/18 0940    Education provided  Yes    Education Details  LUE functioning    Person(s) Educated  Patient    Methods  Explanation;Demonstration;Verbal cues    Comprehension  Verbalized understanding;Returned demonstration          OT Long Term Goals - 02/25/18 1546      OT LONG TERM GOAL #3   Title  Pt. left hand grip with  increase by 5# in preparation for opening a jar.    Baseline  02/25/2018: Left grip strength has improved by 12#. Pt. continues to have difficultyusing his left hand to open containers.      Time  12    Period  Weeks    Status  On-going    Target Date  05/20/18      OT LONG TERM GOAL #6   Title  Pt. will improve left hand coordination skills to be able to button clothing.    Baseline  02/25/2018: Pt. has improved with left hand coordination  skills by 7 sec. of speed. Pt. continues to have difficulty with buttoning.    Time  12    Period  Weeks    Status  On-going    Target Date  05/20/18      OT LONG TERM GOAL #8   Title  Pt. will increase Left pinch strength to be able to grasp and hold items during ADL tasks.    Baseline  02/25/2018: Pt. continues to progress with  pinch strength, and ability to grasp, howeevr has difficulty holding objects.    Time  12    Period  Weeks    Status  On-going    Target Date  05/20/18      OT LONG TERM GOAL  #9   Baseline  Pt. will write one sentence with 75% legibility and efficiently while maintaing grasp on pen 100% of the time.    Time  12    Period  Weeks    Status  On-going    Target Date  05/20/18      OT LONG TERM GOAL  #10   TITLE  Pt. will independently use his left hand to grasp and efficiently accomodate to the varying weights of ADL objects during self care tasks.     Baseline   02/26/2019: Pt. continues to work on grasping and adjusting hand to the varying weights of objects.    Time  12    Period  Weeks    Status  On-going    Target Date  05/20/18      OT LONG TERM GOAL  #11   TITLE  Pt. will improve [facility] skills to be able to independently manipulate items for ADLs/IADLs when UEs are elevated when reaching into the closet, or microwave.    Baseline  02/25/2018: Pt. is progressing however continues to have difficulty manipulating objects when UEs elevated     Time  12    Period  Weeks    Status  On-going    Target Date  05/20/18            Plan - 04/28/18 0941    Clinical Impression Statement Pt. reports he noticed his left UE abducting when trying use his bilateral hands to put together a [place] at a local restuarant. Pt. also has difficulty maintaining hold onto a knife when cutting. Pt. continues to present with impaired [facility] skills, and compensates proximally duirng ADLs, and when performing [facility] coordination tasks. Pt. continues to work on improving LUE strength, and [facility] skills in order to improve ADL, and IADL functioning.    Occupational performance deficits (Please refer to evaluation for details):  ADL's;IADL's    Rehab Potential  Excellent    OT Frequency  2x / week    OT Duration  12 weeks    OT Treatment/Interventions  Self-care/ADL training;Therapeutic exercise;Neuromuscular education;Patient/family education;Energy conservation;Therapeutic activities;DME and/or AE instruction    Clinical Decision Making  Several treatment options, min-mod task modification necessary    Consulted and Agree with Plan of Care  Patient       Patient will benefit from skilled therapeutic intervention in order to improve the following deficits and impairments:  Abnormal gait, Pain, Decreased strength, Decreased range of motion, Impaired UE functional use, Decreased coordination, Decreased knowledge of use of DME, Decreased activity tolerance, Difficulty walking,  Decreased balance, Other (comment)  Visit Diagnosis: Muscle weakness (generalized)  Other lack of coordination    Problem List Patient Active Problem List   Diagnosis Date Noted  .  History of stroke 02/24/2018  . Pure hypercholesterolemia 05/28/2017  . Erectile dysfunction 05/28/2017  . Left hemiparesis (HCC)   . Essential hypertension 11/29/2016  . 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 Hepatitis C 07/22/2013    Jason Messier, MS, OTR/L 04/28/2018, 10:39 AM  [place] [facility] MAIN [facility] SERVICES [address] [place], [place], [address] Phone: [phone]   Fax:  [phone]  Name: Jason Nolan MRN: [id number] Date of Birth: [birth year]

## 2018-05-05 ENCOUNTER — Encounter: Payer: Self-pay | Admitting: Occupational Therapy

## 2018-05-05 ENCOUNTER — Ambulatory Visit: Payer: Medicare Other | Admitting: Occupational Therapy

## 2018-05-05 DIAGNOSIS — R278 Other lack of coordination: Secondary | ICD-10-CM

## 2018-05-05 DIAGNOSIS — M6281 Muscle weakness (generalized): Secondary | ICD-10-CM | POA: Diagnosis not present

## 2018-05-05 NOTE — Therapy (Signed)
[place] [facility] [address] [place], [place], [address] Phone: [phone]   Fax:  [phone]  Occupational Therapy Treatment  Patient Details  Name: Jason Nolan MRN: [id number] Date of Birth: Jun 09, 1945 No data recorded  Encounter Date: 05/05/2018  OT End of Session - 05/05/18 0941    Visit Number  96    Number of Visits  120    Date for OT Re-Evaluation  05/20/18    Authorization Type  Medicare 7 of 10, reporting period starting 04/09/2018    OT Start Time  0930    OT Stop Time  1015    OT Time Calculation (min)  45 min    Activity Tolerance  Patient tolerated treatment well    Behavior During Therapy  [facility] for tasks assessed/performed       Past Medical History:  Diagnosis Date  . Glaucoma   . Hepatitis C   . Stroke [facility](HCC)     Past Surgical History:  Procedure Laterality Date  . IR ANGIO INTRA EXTRACRAN SEL COM CAROTID INNOMINATE UNI L MOD SED  11/25/2016  . IR ANGIO VERTEBRAL SEL SUBCLAVIAN INNOMINATE UNI R MOD SED  11/25/2016  . IR ANGIO VERTEBRAL SEL VERTEBRAL UNI L MOD SED  11/25/2016  . IR INTRAVSC STENT CERV CAROTID W/O EMB-PROT MOD SED INC ANGIO  11/25/2016  . IR PERCUTANEOUS ART THROMBECTOMY/INFUSION INTRACRANIAL INC DIAG ANGIO  11/25/2016  . IR RADIOLOGIST EVAL & MGMT  02/13/2017  . RADIOLOGY WITH ANESTHESIA N/A 11/25/2016   Procedure: RADIOLOGY WITH ANESTHESIA;  Surgeon: Radiologist, Medication, MD;  [facility] OR;  Service: Radiology;  Laterality: N/A;    There were no vitals filed for this visit.  Subjective Assessment - 05/05/18 0935    Subjective   Pt. reports he stopped weaing his nighttime resting hand splint.    Patient is accompained by:  Family member    Pertinent History  Pt. is a 72 y.o. 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 [facility] He underwent CTA brain showed short segment near occlusion of proximal R-ICA with associated intraluminal thrombus  --likely due to dissection and emergent large vessel occlusion with right M2 occlusion and evolving right frontal lobe infarct.  He underwent cerebral angio with  complete revascularization of occluded superior division of R-MCA and near complete revascularization of proximal R-ICA with stent assisted angioplasty.  Stroke felt to be embolic in setting of ruptured plaque R-ICA and he was placed on ASA and brillinta due to stent. Pt. was transferred to inpatient rehab for several weeks, had home health therapy upon discharge, and now is ready for outpatient therapy services.    Limitations  Dominant LUE functioning, left hand edema, distal ROM, coordination, and overal strength.    Currently in Pain?  Yes    Pain Score  3     Pain Location  Head        OT TREATMENT    Neuro muscular re-education:  Pt. worked on translatory movements of the left hand initially starting with 1" sticks. Pt. had difficulty today with moving the sticks through his hand. Pt. dropped multiple sticks.  Pt. worked with the 1/2" flat marbles, and was able to move the objects through his hand to the tip of his 2nd digit, and thumb to place it on a flat tabletop surface. Pt. Had more stiffness, and difficulty today with translatory movements of the hand.  OT Education - 05/05/18 0941    Education provided  Yes    Education Details  LUE functioning    Person(s) Educated  Patient    Methods  Explanation;Demonstration;Verbal cues    Comprehension  Verbalized understanding;Returned demonstration          OT Long Term Goals - 02/25/18 1546      OT LONG TERM GOAL #3   Title  Pt. left hand grip with  increase by 5# in preparation for opening a jar.    Baseline  02/25/2018: Left grip strength has improved by 12#. Pt. continues to have difficultyusing his left hand to open containers.      Time  12    Period  Weeks    Status  On-going    Target Date  05/20/18      OT LONG TERM  GOAL #6   Title  Pt. will improve left hand coordination skills to be able to button clothing.    Baseline  02/25/2018: Pt. has improved with left hand coordination skills by 7 sec. of speed. Pt. continues to have difficulty with buttoning.    Time  12    Period  Weeks    Status  On-going    Target Date  05/20/18      OT LONG TERM GOAL #8   Title  Pt. will increase Left pinch strength to be able to grasp and hold items during ADL tasks.    Baseline  02/25/2018: Pt. continues to progress with  pinch strength, and ability to grasp, howeevr has difficulty holding objects.    Time  12    Period  Weeks    Status  On-going    Target Date  05/20/18      OT LONG TERM GOAL  #9   Baseline  Pt. will write one sentence with 75% legibility and efficiently while maintaing grasp on pen 100% of the time.    Time  12    Period  Weeks    Status  On-going    Target Date  05/20/18      OT LONG TERM GOAL  #10   TITLE  Pt. will independently use his left hand to grasp and efficiently accomodate to the varying weights of ADL objects during self care tasks.     Baseline  02/26/2019: Pt. continues to work on grasping and adjusting hand to the varying weights of objects.    Time  12    Period  Weeks    Status  On-going    Target Date  05/20/18      OT LONG TERM GOAL  #11   TITLE  Pt. will improve [facility] skills to be able to independently manipulate items for ADLs/IADLs when UEs are elevated when reaching into the closet, or microwave.    Baseline  02/25/2018: Pt. is progressing however continues to have difficulty manipulating objects when UEs elevated     Time  12    Period  Weeks    Status  On-going    Target Date  05/20/18            Plan - 05/05/18 0942    Clinical Impression Statement  Pt. has made steady progress overall. Pt. is now able to turn, and open a door knob. Pt. has stopped wearing his night time splint 3 nights ago, and reports more stiffness today in his hand.  Pt. continues to  have difficulty with translatory movements of the hand, moving small objects in  his hand to the tip of his 2nd digit, and thumb. Pt. continues to work on improving UE functioning for improved function ahnad use during ADLs, and IADL tasks.    Occupational performance deficits (Please refer to evaluation for details):  ADL's;IADL's    Rehab Potential  Excellent    OT Frequency  2x / week    OT Duration  12 weeks    OT Treatment/Interventions  Self-care/ADL training;Therapeutic exercise;Neuromuscular education;Patient/family education;Energy conservation;Therapeutic activities;DME and/or AE instruction    Clinical Decision Making  Several treatment options, min-mod task modification necessary    Consulted and Agree with Plan of Care  Patient       Patient will benefit from skilled therapeutic intervention in order to improve the following deficits and impairments:  Abnormal gait, Pain, Decreased strength, Decreased range of motion, Impaired UE functional use, Decreased coordination, Decreased knowledge of use of DME, Decreased activity tolerance, Difficulty walking, Decreased balance, Other (comment)  Visit Diagnosis: Muscle weakness (generalized)  Other lack of coordination    Problem List Patient Active Problem List   Diagnosis Date Noted  . History of stroke 02/24/2018  . Pure hypercholesterolemia 05/28/2017  . Erectile dysfunction 05/28/2017  . Left hemiparesis (HCC)   . Essential hypertension 11/29/2016  . 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 Hepatitis C 07/22/2013    Olegario Messier, MS, OTR/L 05/05/2018, 3:04 PM  [place] [facility] MAIN [facility] SERVICES [address] [place], [place], [address] Phone: [phone]   Fax:  [phone]  Name: Jason Nolan MRN: [id number] Date of Birth: 01-22-1946

## 2018-05-07 ENCOUNTER — Ambulatory Visit: Payer: Medicare Other | Attending: Internal Medicine | Admitting: Occupational Therapy

## 2018-05-07 ENCOUNTER — Encounter: Payer: Self-pay | Admitting: Occupational Therapy

## 2018-05-07 ENCOUNTER — Other Ambulatory Visit: Payer: Self-pay | Admitting: Internal Medicine

## 2018-05-07 DIAGNOSIS — M6281 Muscle weakness (generalized): Secondary | ICD-10-CM | POA: Diagnosis not present

## 2018-05-07 DIAGNOSIS — R278 Other lack of coordination: Secondary | ICD-10-CM | POA: Insufficient documentation

## 2018-05-07 DIAGNOSIS — I10 Essential (primary) hypertension: Secondary | ICD-10-CM

## 2018-05-07 NOTE — Therapy (Signed)
[facility] [place] [facility] MAIN [facility] SERVICES [address] [place], [place], [address] Phone: [phone]   Fax:  [phone]  Occupational Therapy Treatment  Patient Details  Name: Jason Nolan MRN: [id number] Date of Birth: 1945/10/25 No data recorded  Encounter Date: 05/07/2018  OT End of Session - 05/07/18 0943    Visit Number  97    Number of Visits  120    Date for OT Re-Evaluation  05/20/18    Authorization Type  Medicare 8 of 10, reporting period starting 04/09/2018    OT Start Time  0932    OT Stop Time  1015    OT Time Calculation (min)  43 min    Activity Tolerance  Patient tolerated treatment well    Behavior During Therapy  [facility] for tasks assessed/performed       Past Medical History:  Diagnosis Date  . Glaucoma   . Hepatitis C   . Stroke [facility])     Past Surgical History:  Procedure Laterality Date  . IR ANGIO INTRA EXTRACRAN SEL COM CAROTID INNOMINATE UNI L MOD SED  11/25/2016  . IR ANGIO VERTEBRAL SEL SUBCLAVIAN INNOMINATE UNI R MOD SED  11/25/2016  . IR ANGIO VERTEBRAL SEL VERTEBRAL UNI L MOD SED  11/25/2016  . IR INTRAVSC STENT CERV CAROTID W/O EMB-PROT MOD SED INC ANGIO  11/25/2016  . IR PERCUTANEOUS ART THROMBECTOMY/INFUSION INTRACRANIAL INC DIAG ANGIO  11/25/2016  . IR RADIOLOGIST EVAL & MGMT  02/13/2017  . RADIOLOGY WITH ANESTHESIA N/A 11/25/2016   Procedure: RADIOLOGY WITH ANESTHESIA;  Surgeon: Radiologist, Medication, MD;  Location: [facility] OR;  Service: Radiology;  Laterality: N/A;    There were no vitals filed for this visit.  Subjective Assessment - 05/07/18 0941    Subjective   Pt. reports having had a nice New Years.    Patient is accompained by:  Family member    Pertinent History  Pt. is a 73 y.o. 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 Pt. was transferred to [facility]. He underwent CTA brain showed short segment near occlusion of proximal R-ICA with associated intraluminal thrombus --likely due to dissection  and emergent large vessel occlusion with right M2 occlusion and evolving right frontal lobe infarct.  He underwent cerebral angio with  complete revascularization of occluded superior division of R-MCA and near complete revascularization of proximal R-ICA with stent assisted angioplasty.  Stroke felt to be embolic in setting of ruptured plaque R-ICA and he was placed on ASA and brillinta due to stent. Pt. was transferred to inpatient rehab for several weeks, had home health therapy upon discharge, and now is ready for outpatient therapy services.    Limitations  Dominant LUE functioning, left hand edema, distal ROM, coordination, and overal strength.    Patient Stated Goals  To regain the use of his LUE.    Currently in Pain?  Yes      OT TREATMENT    Neuro muscular re-education:  Pt. worked on grasping, and manipulating 1/2" washers from a magnetic dish using point grasp pattern. Pt. worked on reaching up, stabilizing, and sustaining shoulder elevation while placing the washer over a small precise target on vertical dowels positioned at various angles. Pt. worked on [facility] grasping 1/2" small magnetic pegs, disconnecting them, working on translatory  movements and placing them on a positioned on a tabletop surface.  Therapeutic Exercise:  Pt. Performed left gross gripping with grip strengthener. Pt. worked on sustaining grip while grasping pegs and  reaching at various heights. The gripper was placed in the 4th resistive slot with the white resistive spring. 2 reps were performed.                         OT Education - 05/07/18 [phone]    Education provided  Yes    Education Details  LUE functioning    Person(s) Educated  Patient    Methods  Explanation;Demonstration;Verbal cues    Comprehension  Verbalized understanding;Returned demonstration          OT Long Term Goals - 02/25/18 1546      OT LONG TERM GOAL #3   Title  Pt. left hand grip with  increase by 5# in  preparation for opening a jar.    Baseline  02/25/2018: Left grip strength has improved by 12#. Pt. continues to have difficultyusing his left hand to open containers.      Time  12    Period  Weeks    Status  On-going    Target Date  05/20/18      OT LONG TERM GOAL #6   Title  Pt. will improve left hand coordination skills to be able to button clothing.    Baseline  02/25/2018: Pt. has improved with left hand coordination skills by 7 sec. of speed. Pt. continues to have difficulty with buttoning.    Time  12    Period  Weeks    Status  On-going    Target Date  05/20/18      OT LONG TERM GOAL #8   Title  Pt. will increase Left pinch strength to be able to grasp and hold items during ADL tasks.    Baseline  02/25/2018: Pt. continues to progress with  pinch strength, and ability to grasp, howeevr has difficulty holding objects.    Time  12    Period  Weeks    Status  On-going    Target Date  05/20/18      OT LONG TERM GOAL  #9   Baseline  Pt. will write one sentence with 75% legibility and efficiently while maintaing grasp on pen 100% of the time.    Time  12    Period  Weeks    Status  On-going    Target Date  05/20/18      OT LONG TERM GOAL  #10   TITLE  Pt. will independently use his left hand to grasp and efficiently accomodate to the varying weights of ADL objects during self care tasks.     Baseline  02/26/2019: Pt. continues to work on grasping and adjusting hand to the varying weights of objects.    Time  12    Period  Weeks    Status  On-going    Target Date  05/20/18      OT LONG TERM GOAL  #11   TITLE  Pt. will improve [facility] skills to be able to independently manipulate items for ADLs/IADLs when UEs are elevated when reaching into the closet, or microwave.    Baseline  02/25/2018: Pt. is progressing however continues to have difficulty manipulating objects when UEs elevated     Time  12    Period  Weeks    Status  On-going    Target Date  05/20/18             Plan - 05/07/18 0947    Clinical Impression Statement Pt. continues to report having 2/10 headache  pain. Pt. presents with decreased motor control, limited [facility] skills, and translatory movements of the hand. Pt. has difficulty manipulating small objects, and moving them through his hand. Pt. continues to work on improving LUE functioning in order to improve left hand use during ADLS, and IADLs.     Occupational performance deficits (Please refer to evaluation for details):  ADL's;IADL's    Rehab Potential  Excellent    OT Frequency  2x / week    OT Duration  12 weeks    OT Treatment/Interventions  Self-care/ADL training;Therapeutic exercise;Neuromuscular education;Patient/family education;Energy conservation;Therapeutic activities;DME and/or AE instruction    Clinical Decision Making  Several treatment options, min-mod task modification necessary    Consulted and Agree with Plan of Care  Patient       Patient will benefit from skilled therapeutic intervention in order to improve the following deficits and impairments:  Abnormal gait, Pain, Decreased strength, Decreased range of motion, Impaired UE functional use, Decreased coordination, Decreased knowledge of use of DME, Decreased activity tolerance, Difficulty walking, Decreased balance, Other (comment)  Visit Diagnosis: Muscle weakness (generalized)  Other lack of coordination    Problem List Patient Active Problem List   Diagnosis Date Noted  . History of stroke 02/24/2018  . Pure hypercholesterolemia 05/28/2017  . Erectile dysfunction 05/28/2017  . Left hemiparesis (HCC)   . Essential hypertension 11/29/2016  . 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 Hepatitis C 07/22/2013    Olegario Messier, MS, OTR/L 05/07/2018, 10:02 AM  [place] [facility] MAIN [facility] SERVICES [address] [place], [place], [address] Phone: [phone]   Fax:  [phone]  Name: Jason Nolan MRN: [id number] Date of Birth: 06-04-1945

## 2018-05-12 ENCOUNTER — Ambulatory Visit: Payer: Medicare Other | Admitting: Occupational Therapy

## 2018-05-12 ENCOUNTER — Encounter: Payer: Self-pay | Admitting: Occupational Therapy

## 2018-05-12 DIAGNOSIS — R278 Other lack of coordination: Secondary | ICD-10-CM | POA: Diagnosis not present

## 2018-05-12 DIAGNOSIS — M6281 Muscle weakness (generalized): Secondary | ICD-10-CM

## 2018-05-12 NOTE — Therapy (Signed)
[place] [facility] MAIN [facility] [address] [place], [place], [address] Phone: [phone]   Fax:  [phone]  Occupational Therapy Treatment  Patient Details  Name: Jason CroakJames Nolan MRN: [id number] Date of Birth: 11-Dec-1945 No data recorded  Encounter Date: 05/12/2018  OT End of Session - 05/12/18 0925    Visit Number  98    Number of Visits  120    Date for OT Re-Evaluation  05/20/18    Authorization Type  Medicare 9 of 10, reporting period starting 04/09/2018    OT Start Time  0915    OT Stop Time  1000    OT Time Calculation (min)  45 min    Activity Tolerance  Patient tolerated treatment well    Behavior During Therapy  [facility] for tasks assessed/performed       Past Medical History:  Diagnosis Date  . Glaucoma   . Hepatitis C   . Stroke [facility](HCC)     Past Surgical History:  Procedure Laterality Date  . IR ANGIO INTRA EXTRACRAN SEL COM CAROTID INNOMINATE UNI L MOD SED  11/25/2016  . IR ANGIO VERTEBRAL SEL SUBCLAVIAN INNOMINATE UNI R MOD SED  11/25/2016  . IR ANGIO VERTEBRAL SEL VERTEBRAL UNI L MOD SED  11/25/2016  . IR INTRAVSC STENT CERV CAROTID W/O EMB-PROT MOD SED INC ANGIO  11/25/2016  . IR PERCUTANEOUS ART THROMBECTOMY/INFUSION INTRACRANIAL INC DIAG ANGIO  11/25/2016  . IR RADIOLOGIST EVAL & MGMT  02/13/2017  . RADIOLOGY WITH ANESTHESIA N/A 11/25/2016   Procedure: RADIOLOGY WITH ANESTHESIA;  Surgeon: Radiologist, Medication, MD;  Location: [facility] OR;  Service: Radiology;  Laterality: N/A;    There were no vitals filed for this visit.  Subjective Assessment - 05/12/18 0923    Subjective   Pt. is making progress overall    Patient is accompained by:  Family member    Pertinent History  Pt. is a 73 y.o. 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 [facility] He underwent CTA brain showed short segment near occlusion of proximal R-ICA with associated intraluminal thrombus --likely due to dissection and emergent  large vessel occlusion with right M2 occlusion and evolving right frontal lobe infarct.  He underwent cerebral angio with  complete revascularization of occluded superior division of R-MCA and near complete revascularization of proximal R-ICA with stent assisted angioplasty.  Stroke felt to be embolic in setting of ruptured plaque R-ICA and he was placed on ASA and brillinta due to stent. Pt. was transferred to inpatient rehab for several weeks, had home health therapy upon discharge, and now is ready for outpatient therapy services.    Limitations  Dominant LUE functioning, left hand edema, distal ROM, coordination, and overal strength.    Patient Stated Goals  To regain the use of his LUE.    Currently in Pain?  Yes    Pain Score  2     Pain Location  Head    Pain Orientation  Right    Pain Descriptors / Indicators  Aching;Dull    Pain Type  Acute pain      OT TREATMENT    Neuro muscular re-education:  Pt. performed [facility] skills using his left UE to improve speed and dexterity needed for ADLs, IADL tasks, and writing. Pt. demonstrated grasping 1 inch sticks, and  inch cylindrical collars from the dish using his thumb to the tip of his second digit, and thumb on the Purdue Pegboard. Pt. presented with  less compensation proximally with his LUE, and trunk, however continues to present with some compensation with his LUE. Pt. worked on removing the collars with his 3rd digit, and thumb. Pt. Worked on removing the 1" sticks, Pt. Worked on grasping, and storing 1" sticks with his left hand dropping multiple sticks.  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 Copying sentences, and event times on a schedule. Pt. has difficulty maintaining the grip on the pen secondary to the 3rd digit flexing while writing.                        OT Education - 05/12/18 [phone]    Education provided  Yes    Education Details  LUE functioning    Person(s) Educated   Patient    Methods  Explanation;Demonstration;Verbal cues    Comprehension  Verbalized understanding;Returned demonstration          OT Long Term Goals - 02/25/18 1546      OT LONG TERM GOAL #3   Title  Pt. left hand grip with  increase by 5# in preparation for opening a jar.    Baseline  02/25/2018: Left grip strength has improved by 12#. Pt. continues to have difficultyusing his left hand to open containers.      Time  12    Period  Weeks    Status  On-going    Target Date  05/20/18      OT LONG TERM GOAL #6   Title  Pt. will improve left hand coordination skills to be able to button clothing.    Baseline  02/25/2018: Pt. has improved with left hand coordination skills by 7 sec. of speed. Pt. continues to have difficulty with buttoning.    Time  12    Period  Weeks    Status  On-going    Target Date  05/20/18      OT LONG TERM GOAL #8   Title  Pt. will increase Left pinch strength to be able to grasp and hold items during ADL tasks.    Baseline  02/25/2018: Pt. continues to progress with  pinch strength, and ability to grasp, howeevr has difficulty holding objects.    Time  12    Period  Weeks    Status  On-going    Target Date  05/20/18      OT LONG TERM GOAL  #9   Baseline  Pt. will write one sentence with 75% legibility and efficiently while maintaing grasp on pen 100% of the time.    Time  12    Period  Weeks    Status  On-going    Target Date  05/20/18      OT LONG TERM GOAL  #10   TITLE  Pt. will independently use his left hand to grasp and efficiently accomodate to the varying weights of ADL objects during self care tasks.     Baseline  02/26/2019: Pt. continues to work on grasping and adjusting hand to the varying weights of objects.    Time  12    Period  Weeks    Status  On-going    Target Date  05/20/18      OT LONG TERM GOAL  #11   TITLE  Pt. will improve [facility] skills to be able to independently manipulate items for ADLs/IADLs when UEs are elevated when  reaching into the closet, or microwave.    Baseline  02/25/2018: Pt.  is progressing however continues to have difficulty manipulating objects when UEs elevated     Time  12    Period  Weeks    Status  On-going    Target Date  05/20/18            Plan - 05/12/18 0925    Clinical Impression Statement Pt. continues to have headaches. Pt. left his glasses in his car. Pt. was provided with a pair of reading glasses during the session. Pt. reports that he has been using the theraputty starting with the pink, and moved to the green putty at home. Pt. continues to present with limited UE RUE strength, and [facility] skills.  Pt. continues to present with limited UE strength, and [facility] skills in preparation for ADLs, and IADL tasks.     Occupational performance deficits (Please refer to evaluation for details):  ADL's;IADL's    Rehab Potential  Excellent    OT Frequency  2x / week    OT Duration  12 weeks    OT Treatment/Interventions  Self-care/ADL training;Therapeutic exercise;Neuromuscular education;Patient/family education;Energy conservation;Therapeutic activities;DME and/or AE instruction    Clinical Decision Making  Several treatment options, min-mod task modification necessary    Consulted and Agree with Plan of Care  Patient       Patient will benefit from skilled therapeutic intervention in order to improve the following deficits and impairments:  Abnormal gait, Pain, Decreased strength, Decreased range of motion, Impaired UE functional use, Decreased coordination, Decreased knowledge of use of DME, Decreased activity tolerance, Difficulty walking, Decreased balance, Other (comment)  Visit Diagnosis: Muscle weakness (generalized)  Other lack of coordination    Problem List Patient Active Problem List   Diagnosis Date Noted  . History of stroke 02/24/2018  . Pure hypercholesterolemia 05/28/2017  . Erectile dysfunction 05/28/2017  . Left hemiparesis (HCC)   . Essential hypertension  11/29/2016  . 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 Hepatitis C 07/22/2013    Olegario Messier, MS ,OTR/L 05/12/2018, 9:38 AM  [place] [facility] MAIN [facility] SERVICES [address] [place], [place], [address] Phone: [phone]   Fax:  [phone]  Name: Niilo Darbyshire MRN: [id number] Date of Birth: Jun 24, 1945

## 2018-05-14 ENCOUNTER — Encounter: Payer: Self-pay | Admitting: Occupational Therapy

## 2018-05-14 ENCOUNTER — Ambulatory Visit: Payer: Medicare Other | Admitting: Occupational Therapy

## 2018-05-14 DIAGNOSIS — R278 Other lack of coordination: Secondary | ICD-10-CM | POA: Diagnosis not present

## 2018-05-14 DIAGNOSIS — M6281 Muscle weakness (generalized): Secondary | ICD-10-CM

## 2018-05-14 NOTE — Therapy (Addendum)
[facility] MAIN [facility] SERVICES [address] [place], [place], [address] Phone: [phone]   Fax:  [phone]  Occupational Therapy Treatment/10th visit Note Occupational Therapy Progress Note  Dates of reporting period  04/09/2018   to   05/14/2018  Patient Details  Name: Jason Nolan MRN: [id number] Date of Birth: 1946/02/21 No data recorded  Encounter Date: 05/14/2018  OT End of Session - 05/14/18 0932    Visit Number  99    Number of Visits  120    Date for OT Re-Evaluation  05/14/18    Authorization Type  Medicare 10 of 10, reporting period starting 04/09/2018    OT Start Time  0919    OT Stop Time  1000    OT Time Calculation (min)  41 min    Activity Tolerance  Patient tolerated treatment well    Behavior During Therapy  [facility] for tasks assessed/performed       Past Medical History:  Diagnosis Date  . Glaucoma   . Hepatitis C   . Stroke [facility])     Past Surgical History:  Procedure Laterality Date  . IR ANGIO INTRA EXTRACRAN SEL COM CAROTID INNOMINATE UNI L MOD SED  11/25/2016  . IR ANGIO VERTEBRAL SEL SUBCLAVIAN INNOMINATE UNI R MOD SED  11/25/2016  . IR ANGIO VERTEBRAL SEL VERTEBRAL UNI L MOD SED  11/25/2016  . IR INTRAVSC STENT CERV CAROTID W/O EMB-PROT MOD SED INC ANGIO  11/25/2016  . IR PERCUTANEOUS ART THROMBECTOMY/INFUSION INTRACRANIAL INC DIAG ANGIO  11/25/2016  . IR RADIOLOGIST EVAL & MGMT  02/13/2017  . RADIOLOGY WITH ANESTHESIA N/A 11/25/2016   Procedure: RADIOLOGY WITH ANESTHESIA;  Surgeon: Radiologist, Medication, MD;  Location: [facility] OR;  Service: Radiology;  Laterality: N/A;    There were no vitals filed for this visit.      [facility] OT Assessment - 05/14/18 0951      Coordination   Left 9 Hole Peg Test  49      Hand Function   Left Hand Grip (lbs)  46#    Left Hand Lateral Pinch  17 lbs    Left 3 point pinch  12 lbs       OT TREATMENT  Measurements were obtained for grip strength, pinch strength, and [facility]  skills. Pt. required verbal cues, and visual demonstration for hand position during the grip, and 3pt. pinch strength testing. During the 9 hole peg test pt. presented with increased compensation, and winging of the left elbow. Pt. grasped the pegs from a horizontal position and changed the position to a vertical position with each peg slipping between his 3rd, and 4th digits instead of using controlled mature grasp patterns. Goals were reviewed with the patient.  Neuro muscular re-education:  Pt. performed [facility] tasks using the Grooved pegboard. Pt. worked on grasping the grooved pegs from a horizontal position, and moving the pegs to a vertical position in the hand to prepare for placing them in the grooved slot. Pt. dropped several pegs when attempting to perform [facility] skills, and presented with increased compensation proximally.                         OT Long Term Goals - 05/14/18 0950      OT LONG TERM GOAL #3   Title  Pt. left hand grip with  increase by 5# in preparation for opening a jar.    Baseline  05/14/2018: Pt. is now able to  open jars, and containers.    Time  12    Status  Achieved      OT LONG TERM GOAL #8   Title  Pt. will increase Left pinch strength to be able to grasp and hold items during ADL tasks.    Baseline  05/14/2018: Pt. is now able to grasp, and hold items for ADLs.    Time  12    Period  Weeks    Status  Achieved      OT LONG TERM GOAL  #9   Baseline  Pt. will write one sentence with 75% legibility and efficiently while maintaing grasp on pen 100% of the time.    Time  12    Period  Weeks    Status  On-going      OT LONG TERM GOAL  #10   TITLE  Pt. will independently use his left hand to grasp and efficiently accomodate to the varying weights of ADL objects during self care tasks.     Baseline  05/14/2018: Pt. continues to work on grasping and adjusting his hand to the varying weights of objects.    Time  12    Period  Weeks    Status   On-going    Target Date  05/20/18      OT LONG TERM GOAL  #11   TITLE  Pt. will improve [facility] skills to be able to independently manipulate items for ADLs/IADLs when UEs are elevated when reaching into the closet, or microwave.    Baseline  05/14/2018: Pt. continues to have difficulty manipulating objects when UEs elevated     Time  12    Period  Weeks    Status  On-going    Target Date  05/14/18            Plan - 05/14/18 0934    Clinical Impression Statement Pt. presents with limited left hand strength, and the development of [facility] skills, and translatory movements of the hand.  Pt. continues to work on improving LUE strength, and [facility] skills. Pt. continues to work on improving LUE functioning in prepraration for increased functional use during ADL, and IADL functioning. Goals were reviewed with the patient.   Occupational performance deficits (Please refer to evaluation for details):  ADL's;IADL's    Rehab Potential  Excellent    OT Frequency  2x / week    OT Duration  12 weeks    OT Treatment/Interventions  Self-care/ADL training;Therapeutic exercise;Neuromuscular education;Patient/family education;Energy conservation;Therapeutic activities;DME and/or AE instruction    Clinical Decision Making  Several treatment options, min-mod task modification necessary    Consulted and Agree with Plan of Care  Patient       Patient will benefit from skilled therapeutic intervention in order to improve the following deficits and impairments:  Abnormal gait, Pain, Decreased strength, Decreased range of motion, Impaired UE functional use, Decreased coordination, Decreased knowledge of use of DME, Decreased activity tolerance, Difficulty walking, Decreased balance, Other (comment)  Visit Diagnosis: Muscle weakness (generalized)    Problem List Patient Active Problem List   Diagnosis Date Noted  . History of stroke 02/24/2018  . Pure hypercholesterolemia 05/28/2017  . Erectile dysfunction  05/28/2017  . Left hemiparesis (HCC)   . Essential hypertension 11/29/2016  . 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 Hepatitis C 07/22/2013    Olegario MessierElaine Vanden Fawaz, MS, OTR/L 05/14/2018,  10:26 AM  [place] [facility] MAIN [facility] SERVICES [address] [place], [place], [address] Phone: [phone]   Fax:  [phone]  Name: Jordell Gibbins MRN: [id number] Date of Birth: 1945-05-27

## 2018-05-19 ENCOUNTER — Encounter: Payer: Self-pay | Admitting: Occupational Therapy

## 2018-05-19 ENCOUNTER — Ambulatory Visit: Payer: Medicare Other | Admitting: Occupational Therapy

## 2018-05-19 DIAGNOSIS — R278 Other lack of coordination: Secondary | ICD-10-CM | POA: Diagnosis not present

## 2018-05-19 DIAGNOSIS — M6281 Muscle weakness (generalized): Secondary | ICD-10-CM

## 2018-05-19 NOTE — Therapy (Signed)
[place] [facility] MAIN [facility] SERVICES [address] [place], [place], [address] Phone: [phone]   Fax:  [phone]  Occupational Therapy Treatment/Recertification Note  Patient Details  Name: Jason Nolan MRN: [id number] Date of Birth: 1946/02/20 No data recorded  Encounter Date: 05/19/2018  OT End of Session - 05/19/18 0929    Visit Number  100    Number of Visits  120    Date for OT Re-Evaluation  08/11/18    Authorization Type  Next 10th visit reporting period starts from 05/19/2018    OT Start Time  0915    OT Stop Time  1000    OT Time Calculation (min)  45 min    Activity Tolerance  Patient tolerated treatment well    Behavior During Therapy  [facility] for tasks assessed/performed       Past Medical History:  Diagnosis Date  . Glaucoma   . Hepatitis C   . Stroke [facility])     Past Surgical History:  Procedure Laterality Date  . IR ANGIO INTRA EXTRACRAN SEL COM CAROTID INNOMINATE UNI L MOD SED  11/25/2016  . IR ANGIO VERTEBRAL SEL SUBCLAVIAN INNOMINATE UNI R MOD SED  11/25/2016  . IR ANGIO VERTEBRAL SEL VERTEBRAL UNI L MOD SED  11/25/2016  . IR INTRAVSC STENT CERV CAROTID W/O EMB-PROT MOD SED INC ANGIO  11/25/2016  . IR PERCUTANEOUS ART THROMBECTOMY/INFUSION INTRACRANIAL INC DIAG ANGIO  11/25/2016  . IR RADIOLOGIST EVAL & MGMT  02/13/2017  . RADIOLOGY WITH ANESTHESIA N/A 11/25/2016   Procedure: RADIOLOGY WITH ANESTHESIA;  Surgeon: Radiologist, Medication, MD;  Location: [facility] OR;  Service: Radiology;  Laterality: N/A;    There were no vitals filed for this visit.  Subjective Assessment - 05/19/18 0930    Subjective   Pt. plans to go to [place] at the end of the month.    Patient is accompained by:  Family member    Pertinent History  Pt. is a 73 y.o. 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 Pt. was transferred to [facility]. He underwent CTA brain showed short segment near occlusion of proximal R-ICA with associated intraluminal  thrombus --likely due to dissection and emergent large vessel occlusion with right M2 occlusion and evolving right frontal lobe infarct.  He underwent cerebral angio with  complete revascularization of occluded superior division of R-MCA and near complete revascularization of proximal R-ICA with stent assisted angioplasty.  Stroke felt to be embolic in setting of ruptured plaque R-ICA and he was placed on ASA and brillinta due to stent. Pt. was transferred to inpatient rehab for several weeks, had home health therapy upon discharge, and now is ready for outpatient therapy services.    Limitations  Dominant LUE functioning, left hand edema, distal ROM, coordination, and overal strength.    Currently in Pain?  Yes    Pain Score  2     Pain Location  Head    Pain Orientation  Right    Pain Descriptors / Indicators  Aching;Dull         [facility] OT Assessment - 05/19/18 0940      Coordination   Left 9 Hole Peg Test  1 min. & 3 sec.   Dropped pegs, compensated, and grasped with 3rd digit thumb.     Hand Function   Left Hand Grip (lbs)  50    Left Hand Lateral Pinch  16 lbs    Left 3 point pinch  11 lbs  Measurements were obtained, and goals were reviewed with the pt.  OT TREATMENT    Neuro muscular re-education:  Pt. worked on grasping 1/2" washers with the left hand, and placing them onto vertical dowels positioned at various raised angles. Verbal cues, and visual demonstration were provided to the pt.                               OT Education - 05/19/18 [phone]    Education provided  Yes    Education Details  LUE functioning    Person(s) Educated  Patient    Methods  Explanation;Demonstration;Verbal cues    Comprehension  Verbalized understanding;Returned demonstration          OT Long Term Goals - 05/19/18 0951      OT LONG TERM GOAL #6   Title  Pt. will improve left hand coordination skills to be able to button clothing.    Baseline  05/19/2018:  Pt. has limited left hand FMC, speed, and dexterity, Pt. continues to have difficulty with buttoning.    Time  12    Period  Weeks    Status  On-going    Target Date  08/11/18      OT LONG TERM GOAL #8   Title  Pt. will increase Left pinch strength to be able to open a bag of chips.    Baseline  05/19/2018: Pt. is able to grasp items more securely now, however pt. is uable to maintain grasp on potato chip bags.    Time  12    Period  Weeks    Status  Revised    Target Date  08/11/18      OT LONG TERM GOAL  #9   Baseline  Pt. will write one sentence with 75% legibility and efficiently while maintaing grasp on pen 100% of the time.    Time  12    Period  Weeks    Status  On-going    Target Date  08/11/18      OT LONG TERM GOAL  #10   TITLE  Pt. will independently use his left hand to grasp and efficiently accomodate to the varying weights of ADL objects during self care tasks.     Baseline  05/19/2018: Pt. continues to work on grasping and adjusting his hand to the varying weights of objects.    Time  12    Period  Weeks    Status  On-going    Target Date  08/11/18      OT LONG TERM GOAL  #11   TITLE  Pt. will improve [facility] skills to be able to independently manipulate items for ADLs/IADLs when UEs are elevated when reaching into the closet, or microwave.    Baseline  05/19/2018: Pt. continues to have difficulty manipulating objects when UEs elevated     Time  12    Period  Weeks    Status  On-going    Target Date  08/11/18            Plan - 05/19/18 0934    Clinical Impression Statement  Pt. presents with limited left sided motor control, [facility] skills, and translatory movements of the hand. Pt. has improved with ADL functioning, and is now able to engage his left UE mopre during ADLs, and IADL tasks.  Pt. has difficulty sustaining grasp onto objects such as bags when opening them, grasping and manipulatiing items when reaching  overhead, and writing tasks. Pt. continues to work on  improving UE strength, and [facility] skills in preparation for improved functional performance during ADLs, and IADLs.    Occupational performance deficits (Please refer to evaluation for details):  ADL's;IADL's    Rehab Potential  Excellent    OT Frequency  2x / week    OT Duration  12 weeks    OT Treatment/Interventions  Self-care/ADL training;Therapeutic exercise;Neuromuscular education;Patient/family education;Energy conservation;Therapeutic activities;DME and/or AE instruction    Clinical Decision Making  Several treatment options, min-mod task modification necessary    Consulted and Agree with Plan of Care  Patient       Patient will benefit from skilled therapeutic intervention in order to improve the following deficits and impairments:  Abnormal gait, Pain, Decreased strength, Decreased range of motion, Impaired UE functional use, Decreased coordination, Decreased knowledge of use of DME, Decreased activity tolerance, Difficulty walking, Decreased balance, Other (comment), Decreased knowledge of precautions  Visit Diagnosis: Muscle weakness (generalized)    Problem List Patient Active Problem List   Diagnosis Date Noted  . History of stroke 02/24/2018  . Pure hypercholesterolemia 05/28/2017  . Erectile dysfunction 05/28/2017  . Left hemiparesis (HCC)   . Essential hypertension 11/29/2016  . 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 Hepatitis C 07/22/2013    Olegario Messier, MS, OTR/L 05/19/2018, 4:34 PM  [place] [facility] MAIN [facility] SERVICES [address] [place], [place], [address] Phone: [phone]   Fax:  [phone]  Name: Abdel Decaprio MRN: [id number] Date of Birth: 1945/10/12

## 2018-05-20 ENCOUNTER — Encounter: Payer: Self-pay | Admitting: Gastroenterology

## 2018-05-20 ENCOUNTER — Ambulatory Visit (INDEPENDENT_AMBULATORY_CARE_PROVIDER_SITE_OTHER): Payer: Medicare Other | Admitting: Gastroenterology

## 2018-05-20 VITALS — BP 125/74 | HR 56 | Ht 70.0 in | Wt 198.4 lb

## 2018-05-20 DIAGNOSIS — R768 Other specified abnormal immunological findings in serum: Secondary | ICD-10-CM

## 2018-05-20 DIAGNOSIS — Z1211 Encounter for screening for malignant neoplasm of colon: Secondary | ICD-10-CM

## 2018-05-20 NOTE — Progress Notes (Signed)
Wyline Mood MD, MRCP(U.K) [address]  [address]  [place], [place] [address]  Main: [phone]  Fax: [phone]   Primary Care Physician: [name], NP  Primary Gastroenterologist:  Dr. Wyline Mood   Chief Complaint  Patient presents with  . Follow-up    Hepatitis C    HPI: Jason Nolan is a 73 y.o. male    Initially seen in 03/2018 for hepatitis C . Previously seen at [facility] hepatology for hepatitis C back in 12/2015 . S/p harvoni in 2015 with cure attained. Prior treatment with Peg-interferon in 2009 . 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    01/15/18 : hepatitis B surface antibody is positive.  06/2017: Hepatitis B core antibody is positive. HCV viral load,HIV is negative.  06/2017 : normal platelet count and hb .  05/2017 : CMP normal.  06/2017: RUQ USG normal liver  Last colonoscopy in 05/2008 and was normal .    Interval history 03/31/18-05/20/2018  04/2018 : HBsAG,Hep Be ag negative, Hep B eAb,Hep A ab  positive.   Liver elastography - F2 and some F3 fibrosis seen. No HCC.     Current Outpatient Medications  Medication Sig Dispense Refill  . amLODipine (NORVASC) 10 MG tablet TAKE 1 TABLET BY MOUTH EVERY DAY 90 tablet 0  . aspirin 81 MG chewable tablet Chew 1 tablet (81 mg total) by mouth daily.    Marland Kitchen atorvastatin (LIPITOR) 40 MG tablet TAKE 1 TABLET DAILY AT 6 P.M. 90 tablet 1  . dorzolamide-timolol (COSOPT) 22.3-6.8 MG/ML ophthalmic solution Place 1 drop into both eyes.     Marland Kitchen latanoprost (XALATAN) 0.005 % ophthalmic solution INSTILL 1 DROP INTO BOTH EYES EVERY DAY IN THE EVENING  5  . tadalafil (CIALIS) 20 MG tablet TAKE 0.5-1 TABLETS (10-20 MG TOTAL) BY MOUTH EVERY OTHER DAY AS NEEDED FOR ERECTILE DYSFUNCTION.  2  . topiramate (TOPAMAX) 25 MG tablet Take 1 tablet (25 mg total) by mouth 2 (two) times daily. (Patient not taking: Reported on 04/13/2018) 60 tablet 2  . VYZULTA 0.024 % SOLN Apply 1 drop to eye at bedtime.      No current  facility-administered medications for this visit.     Allergies as of 05/20/2018  . (No Known Allergies)    ROS:  General: Negative for anorexia, weight loss, fever, chills, fatigue, weakness. ENT: Negative for hoarseness, difficulty swallowing , nasal congestion. CV: Negative for chest pain, angina, palpitations, dyspnea on exertion, peripheral edema.  Respiratory: Negative for dyspnea at rest, dyspnea on exertion, cough, sputum, wheezing.  GI: See history of present illness. GU:  Negative for dysuria, hematuria, urinary incontinence, urinary frequency, nocturnal urination.  Endo: Negative for unusual weight change.    Physical Examination:   BP 125/74   Pulse (!) 56   Ht 5\' 10"  (1.778 m)   Wt 198 lb 6.4 oz (90 kg)   BMI 28.47 kg/m   General: Well-nourished, well-developed in no acute distress.  Eyes: No icterus. Conjunctivae pink. Mouth: Oropharyngeal mucosa moist and pink , no lesions erythema or exudate. Lungs: Clear to auscultation bilaterally. Non-labored. Heart: Regular rate and rhythm, no murmurs rubs or gallops.  Abdomen: Bowel sounds are normal, nontender, nondistended, no hepatosplenomegaly or masses, no abdominal bruits or hernia , no rebound or guarding.   Extremities: No lower extremity edema. No clubbing or deformities. Neuro: Alert and oriented x 3.  Grossly intact. Skin: Warm and dry, no jaundice.   Psych: Alert and cooperative, normal mood and affect.  Imaging Studies: No results found.  Assessment and Plan:   Jason Nolan is a 73 y.o. y/o male here to follow up for hepatitis C. Per prior records He has been cured of hepatitis [facility], recent viral load is negative. 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 He is Hep B e antigen ,surface antigen negative with Hep B e antibody and surface antibody positive suggestive he is presently immune to hepatitis B from prior infection. No biochemical or  radiological evidence of cirrhosis.   Plan  1. Screening colonoscopy 2. Suggest repeat liver elastography in 1 year to ensure no progression to cirrhosis.    I have discussed alternative options, risks & benefits,  which include, but are not limited to, bleeding, infection, perforation,respiratory complication & drug reaction.  The patient agrees with this plan & written consent will be obtained.      Dr Wyline MoodKiran Yorley Buch  MD,MRCP [facility](U.K) Follow up in 1 year.

## 2018-05-21 ENCOUNTER — Encounter: Payer: Self-pay | Admitting: Occupational Therapy

## 2018-05-21 ENCOUNTER — Ambulatory Visit: Payer: Medicare Other | Admitting: Occupational Therapy

## 2018-05-21 DIAGNOSIS — R278 Other lack of coordination: Secondary | ICD-10-CM | POA: Diagnosis not present

## 2018-05-21 DIAGNOSIS — M6281 Muscle weakness (generalized): Secondary | ICD-10-CM

## 2018-05-21 NOTE — Therapy (Signed)
[facility] MAIN [facility] [address] [place], [place], [address] Phone: [phone]   Fax:  [phone]  Occupational Therapy Treatment  Patient Details  Name: Jason CroakJames Nolan MRN: [id number] Date of Birth: [birth year] No data recorded  Encounter Date: 05/21/2018  OT End of Session - 05/21/18 0934    Visit Number  100    Number of Visits  120    Date for OT Re-Evaluation  08/11/18    Authorization Type  Next 10th visit reporting period starts from 05/19/2018    OT Start Time  0915    OT Stop Time  1000    OT Time Calculation (min)  45 min    Activity Tolerance  Patient tolerated treatment well    Behavior During Therapy  [facility] for tasks assessed/performed       Past Medical History:  Diagnosis Date  . Glaucoma   . Hepatitis C   . Stroke [facility](HCC)     Past Surgical History:  Procedure Laterality Date  . IR ANGIO INTRA EXTRACRAN SEL COM CAROTID INNOMINATE UNI L MOD SED  11/25/2016  . IR ANGIO VERTEBRAL SEL SUBCLAVIAN INNOMINATE UNI R MOD SED  11/25/2016  . IR ANGIO VERTEBRAL SEL VERTEBRAL UNI L MOD SED  11/25/2016  . IR INTRAVSC STENT CERV CAROTID W/O EMB-PROT MOD SED INC ANGIO  11/25/2016  . IR PERCUTANEOUS ART THROMBECTOMY/INFUSION INTRACRANIAL INC DIAG ANGIO  11/25/2016  . IR RADIOLOGIST EVAL & MGMT  02/13/2017  . RADIOLOGY WITH ANESTHESIA N/A 11/25/2016   Procedure: RADIOLOGY WITH ANESTHESIA;  Surgeon: Radiologist, Medication, MD;  Location: [facility] OR;  Service: Radiology;  Laterality: N/A;    There were no vitals filed for this visit.  Subjective Assessment - 05/21/18 0924    Subjective   [place] of [place].    Patient is accompained by:  Family member    Pertinent History  Pt. is a 73 y.o. 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 [facility] He underwent CTA brain showed short segment near occlusion of proximal R-ICA with associated intraluminal thrombus --likely due to  dissection and emergent large vessel occlusion with right M2 occlusion and evolving right frontal lobe infarct.  He underwent cerebral angio with  complete revascularization of occluded superior division of R-MCA and near complete revascularization of proximal R-ICA with stent assisted angioplasty.  Stroke felt to be embolic in setting of ruptured plaque R-ICA and he was placed on ASA and brillinta due to stent. Pt. was transferred to inpatient rehab for several weeks, had home health therapy upon discharge, and now is ready for outpatient therapy services.    Limitations  Dominant LUE functioning, left hand edema, distal ROM, coordination, and overal strength.    Currently in Pain?  Yes       OT TREATMENT    Neuro muscular re-education:  Pt. worked on [facility] grasping 1/2" small magnetic pegs, disconnecting them, performing translatory movements of the hand, and placing them on a vertical whiteboard  positioned at a vertical angle at a tabletop, on an elevated angle to encourage [facility] skills with arms sustained in elevation. Pt. worked on removing the pegs while alternating thumb opposition to the tip of the 2nd through 5th digits. Pt. Required cues to move the peg all the way to the distal tip of his 2nd digit, and thumb. Pt. worked on [facility] skills in order to be able to use his  hand to open chip bags, and packages, write legibly, and button his shirt,                          OT Education - 05/21/18 0933    Education provided  Yes    Education Details  LUE functioning    Person(s) Educated  Patient    Methods  Explanation;Demonstration;Verbal cues    Comprehension  Verbalized understanding;Returned demonstration          OT Long Term Goals - 05/19/18 0951      OT LONG TERM GOAL #6   Title  Pt. will improve left hand coordination skills to be able to button clothing.    Baseline  05/19/2018: Pt. has limited left hand FMC, speed, and dexterity, Pt. continues to have  difficulty with buttoning.    Time  12    Period  Weeks    Status  On-going    Target Date  08/11/18      OT LONG TERM GOAL #8   Title  Pt. will increase Left pinch strength to be able to open a bag of chips.    Baseline  05/19/2018: Pt. is able to grasp items more securely now, however pt. is uable to maintain grasp on potato chip bags.    Time  12    Period  Weeks    Status  Revised    Target Date  08/11/18      OT LONG TERM GOAL  #9   Baseline  Pt. will write one sentence with 75% legibility and efficiently while maintaing grasp on pen 100% of the time.    Time  12    Period  Weeks    Status  On-going    Target Date  08/11/18      OT LONG TERM GOAL  #10   TITLE  Pt. will independently use his left hand to grasp and efficiently accomodate to the varying weights of ADL objects during self care tasks.     Baseline  05/19/2018: Pt. continues to work on grasping and adjusting his hand to the varying weights of objects.    Time  12    Period  Weeks    Status  On-going    Target Date  08/11/18      OT LONG TERM GOAL  #11   TITLE  Pt. will improve [facility] skills to be able to independently manipulate items for ADLs/IADLs when UEs are elevated when reaching into the closet, or microwave.    Baseline  05/19/2018: Pt. continues to have difficulty manipulating objects when UEs elevated     Time  12    Period  Weeks    Status  On-going    Target Date  08/11/18            Plan - 05/21/18 [id number]    Clinical Impression Statement  Pt. continues present with limited left hand [facility] skills performing translatory movements to the hand which limits his ability to button his shirt, write legibly, and efficiently, and open a bag of chips. Pt. conitnues to work on improving left hand [facility] skills in order to improve UE functioning during ADLs, and IADLs.    Occupational performance deficits (Please refer to evaluation for details):  ADL's;IADL's    Rehab Potential  Excellent    OT Frequency  2x / week     OT Duration  12 weeks    OT Treatment/Interventions  Self-care/ADL training;Therapeutic  exercise;Neuromuscular education;Patient/family education;Energy conservation;Therapeutic activities;DME and/or AE instruction    Clinical Decision Making  Several treatment options, min-mod task modification necessary    Consulted and Agree with Plan of Care  Patient       Patient will benefit from skilled therapeutic intervention in order to improve the following deficits and impairments:  Abnormal gait, Pain, Decreased strength, Decreased range of motion, Impaired UE functional use, Decreased coordination, Decreased knowledge of use of DME, Decreased activity tolerance, Difficulty walking, Decreased balance, Other (comment), Decreased knowledge of precautions  Visit Diagnosis: Muscle weakness (generalized)  Other lack of coordination    Problem List Patient Active Problem List   Diagnosis Date Noted  . History of stroke 02/24/2018  . Pure hypercholesterolemia 05/28/2017  . Erectile dysfunction 05/28/2017  . Left hemiparesis (HCC)   . Essential hypertension 11/29/2016  . 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 Hepatitis C 07/22/2013    Olegario Messier, MS, OTR/L 05/21/2018, 10:03 AM  [facility] MAIN [facility] SERVICES [address] [place], [place], [address] Phone: [phone]   Fax:  [phone]  Name: Kaylin Kurtis MRN: [id number] Date of Birth: May 20, 1945

## 2018-05-25 ENCOUNTER — Telehealth (HOSPITAL_COMMUNITY): Payer: Self-pay

## 2018-05-25 ENCOUNTER — Other Ambulatory Visit (HOSPITAL_COMMUNITY): Payer: Self-pay | Admitting: Interventional Radiology

## 2018-05-25 DIAGNOSIS — I771 Stricture of artery: Secondary | ICD-10-CM

## 2018-05-25 DIAGNOSIS — H401121 Primary open-angle glaucoma, left eye, mild stage: Secondary | ICD-10-CM | POA: Diagnosis not present

## 2018-05-25 NOTE — Telephone Encounter (Signed)
Called to schedule a 6 month f/u US carotid, no answer, left vm. AW

## 2018-05-26 ENCOUNTER — Ambulatory Visit: Payer: Medicare Other | Admitting: Occupational Therapy

## 2018-05-26 ENCOUNTER — Encounter: Payer: Self-pay | Admitting: Occupational Therapy

## 2018-05-26 DIAGNOSIS — R278 Other lack of coordination: Secondary | ICD-10-CM | POA: Diagnosis not present

## 2018-05-26 DIAGNOSIS — M6281 Muscle weakness (generalized): Secondary | ICD-10-CM | POA: Diagnosis not present

## 2018-05-26 NOTE — Therapy (Signed)
[place] [facility] MAIN [employer] [facility] [address] [place], [place], [address] Phone: [phone]   Fax:  [phone]  Occupational Therapy Treatment  Patient Details  Name: Jason Nolan MRN: [id number] Date of Birth: 21-Dec-1945 No data recorded  Encounter Date: 05/26/2018  OT End of Session - 05/26/18 0926    Visit Number  101    Number of Visits  120    Date for OT Re-Evaluation  08/11/18    Authorization Type  Next 10th visit reporting period starts from 05/19/2018    OT Start Time  0920    OT Stop Time  1000    OT Time Calculation (min)  40 min    Activity Tolerance  Patient tolerated treatment well    Behavior During Therapy  [facility] for tasks assessed/performed       Past Medical History:  Diagnosis Date  . Glaucoma   . Hepatitis C   . Stroke [facility](HCC)     Past Surgical History:  Procedure Laterality Date  . IR ANGIO INTRA EXTRACRAN SEL COM CAROTID INNOMINATE UNI L MOD SED  11/25/2016  . IR ANGIO VERTEBRAL SEL SUBCLAVIAN INNOMINATE UNI R MOD SED  11/25/2016  . IR ANGIO VERTEBRAL SEL VERTEBRAL UNI L MOD SED  11/25/2016  . IR INTRAVSC STENT CERV CAROTID W/O EMB-PROT MOD SED INC ANGIO  11/25/2016  . IR PERCUTANEOUS ART THROMBECTOMY/INFUSION INTRACRANIAL INC DIAG ANGIO  11/25/2016  . IR RADIOLOGIST EVAL & MGMT  02/13/2017  . RADIOLOGY WITH ANESTHESIA N/A 11/25/2016   Procedure: RADIOLOGY WITH ANESTHESIA;  Surgeon: Radiologist, Medication, MD;  Location: [facility] OR;  Service: Radiology;  Laterality: N/A;    There were no vitals filed for this visit.  Subjective Assessment - 05/26/18 0925    Subjective   [place] of [place].    Patient is accompained by:  Family member    Pertinent History  Pt. is a 73 y.o. 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 [facility] He underwent CTA brain showed short segment near occlusion of proximal R-ICA with associated intraluminal thrombus --likely due to  dissection and emergent large vessel occlusion with right M2 occlusion and evolving right frontal lobe infarct.  He underwent cerebral angio with  complete revascularization of occluded superior division of R-MCA and near complete revascularization of proximal R-ICA with stent assisted angioplasty.  Stroke felt to be embolic in setting of ruptured plaque R-ICA and he was placed on ASA and brillinta due to stent. Pt. was transferred to inpatient rehab for several weeks, had home health therapy upon discharge, and now is ready for outpatient therapy services.    Limitations  Dominant LUE functioning, left hand edema, distal ROM, coordination, and overal strength.    Currently in Pain?  Yes      OT TREATMENT    Neuro muscular re-education:  Pt. worked on grasping, and manipulating 1/2" washers from a magnetic dish using point grasp pattern, and moving them through his hand. Pt. worked on reaching up, stabilizing, and sustaining shoulder elevation while placing the washer over a small precise target on vertical dowels positioned at various angles. Pt. worked on removing the washers alternating thumb opposition to the tip of his 2nd digit through thumb. Pt. Worked on grasping coins from a tabletop surface. Pt. Worked on translatory movements to prepare for stacking the coins. Pt. Dropped several washers. Pt worked on Engineer, petroleumthese skills skills in preparation to  be able to manipulate items during ADLS, and IADLs.                      OT Education - 05/26/18 [phone]    Education provided  Yes    Education Details  LUE functioning    Person(s) Educated  Patient    Methods  Explanation;Demonstration;Verbal cues    Comprehension  Verbalized understanding;Returned demonstration          OT Long Term Goals - 05/19/18 0951      OT LONG TERM GOAL #6   Title  Pt. will improve left hand coordination skills to be able to button clothing.    Baseline  05/19/2018: Pt. has limited left hand FMC,  speed, and dexterity, Pt. continues to have difficulty with buttoning.    Time  12    Period  Weeks    Status  On-going    Target Date  08/11/18      OT LONG TERM GOAL #8   Title  Pt. will increase Left pinch strength to be able to open a bag of chips.    Baseline  05/19/2018: Pt. is able to grasp items more securely now, however pt. is uable to maintain grasp on potato chip bags.    Time  12    Period  Weeks    Status  Revised    Target Date  08/11/18      OT LONG TERM GOAL  #9   Baseline  Pt. will write one sentence with 75% legibility and efficiently while maintaing grasp on pen 100% of the time.    Time  12    Period  Weeks    Status  On-going    Target Date  08/11/18      OT LONG TERM GOAL  #10   TITLE  Pt. will independently use his left hand to grasp and efficiently accomodate to the varying weights of ADL objects during self care tasks.     Baseline  05/19/2018: Pt. continues to work on grasping and adjusting his hand to the varying weights of objects.    Time  12    Period  Weeks    Status  On-going    Target Date  08/11/18      OT LONG TERM GOAL  #11   TITLE  Pt. will improve [facility] skills to be able to independently manipulate items for ADLs/IADLs when UEs are elevated when reaching into the closet, or microwave.    Baseline  05/19/2018: Pt. continues to have difficulty manipulating objects when UEs elevated     Time  12    Period  Weeks    Status  On-going    Target Date  08/11/18            Plan - 05/26/18 0973    Clinical Impression Statement  Pt. is making progress steady progress overall. Pt. continues to present with limited left hand FMCs, and translatory movements of the hand which makes it difficult to writie legibly, efficiently, opening  a bag of chip. Pt. continues to work on improving  [facility] skills, in order to improve ADLs, and IADL functioning.     Occupational performance deficits (Please refer to evaluation for details):  ADL's;IADL's    Rehab  Potential  Excellent    OT Frequency  2x / week    OT Duration  12 weeks    OT Treatment/Interventions  Self-care/ADL training;Therapeutic exercise;Neuromuscular education;Patient/family education;Energy conservation;Therapeutic activities;DME and/or AE  instruction    Clinical Decision Making  Several treatment options, min-mod task modification necessary    Consulted and Agree with Plan of Care  Patient       Patient will benefit from skilled therapeutic intervention in order to improve the following deficits and impairments:  Abnormal gait, Pain, Decreased strength, Decreased range of motion, Impaired UE functional use, Decreased coordination, Decreased knowledge of use of DME, Decreased activity tolerance, Difficulty walking, Decreased balance, Other (comment), Decreased knowledge of precautions  Visit Diagnosis: Muscle weakness (generalized)  Other lack of coordination    Problem List Patient Active Problem List   Diagnosis Date Noted  . History of stroke 02/24/2018  . Pure hypercholesterolemia 05/28/2017  . Erectile dysfunction 05/28/2017  . Left hemiparesis (HCC)   . Essential hypertension 11/29/2016  . 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 Hepatitis C 07/22/2013    Olegario MessierElaine Emon Lance, MS, OTR/L 05/26/2018, 9:48 AM  [place] [facility] [address] [place], [place], [address] Phone: [phone]   Fax:  [phone]  Name: Jason CroakJames Marlett MRN: [id number] Date of Birth: 06-29-1945

## 2018-05-28 ENCOUNTER — Ambulatory Visit: Payer: Medicare Other | Admitting: Occupational Therapy

## 2018-05-28 ENCOUNTER — Encounter: Payer: Self-pay | Admitting: Occupational Therapy

## 2018-05-28 DIAGNOSIS — M6281 Muscle weakness (generalized): Secondary | ICD-10-CM

## 2018-05-28 DIAGNOSIS — R278 Other lack of coordination: Secondary | ICD-10-CM | POA: Diagnosis not present

## 2018-05-28 NOTE — Therapy (Signed)
[place] [facility] [address] [place], [place], [address] Phone: [phone]   Fax:  [phone]  Occupational Therapy Treatment  Patient Details  Name: Jason CroakJames Nolan MRN: [id number] Date of Birth: 1945/07/22 No data recorded  Encounter Date: 05/28/2018  OT End of Session - 05/28/18 0943    Visit Number  102    Number of Visits  120    Date for OT Re-Evaluation  08/11/18    Authorization Type  Progress reporting period starting 05/19/2018    OT Start Time  0930    OT Stop Time  1015    OT Time Calculation (min)  45 min    Activity Tolerance  Patient tolerated treatment well    Behavior During Therapy  [facility] for tasks assessed/performed       Past Medical History:  Diagnosis Date  . Glaucoma   . Hepatitis C   . Stroke [facility](HCC)     Past Surgical History:  Procedure Laterality Date  . IR ANGIO INTRA EXTRACRAN SEL COM CAROTID INNOMINATE UNI L MOD SED  11/25/2016  . IR ANGIO VERTEBRAL SEL SUBCLAVIAN INNOMINATE UNI R MOD SED  11/25/2016  . IR ANGIO VERTEBRAL SEL VERTEBRAL UNI L MOD SED  11/25/2016  . IR INTRAVSC STENT CERV CAROTID W/O EMB-PROT MOD SED INC ANGIO  11/25/2016  . IR PERCUTANEOUS ART THROMBECTOMY/INFUSION INTRACRANIAL INC DIAG ANGIO  11/25/2016  . IR RADIOLOGIST EVAL & MGMT  02/13/2017  . RADIOLOGY WITH ANESTHESIA N/A 11/25/2016   Procedure: RADIOLOGY WITH ANESTHESIA;  Surgeon: Radiologist, Medication, MD;  Location: [facility] OR;  Service: Radiology;  Laterality: N/A;    There were no vitals filed for this visit.  Subjective Assessment - 05/28/18 0939    Subjective   [place].     Patient is accompained by:  Family member    Pertinent History  Pt. is a 73 y.o. 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 [facility] He underwent CTA brain showed short segment near occlusion of proximal R-ICA with associated intraluminal thrombus --likely due to dissection and emergent  large vessel occlusion with right M2 occlusion and evolving right frontal lobe infarct.  He underwent cerebral angio with  complete revascularization of occluded superior division of R-MCA and near complete revascularization of proximal R-ICA with stent assisted angioplasty.  Stroke felt to be embolic in setting of ruptured plaque R-ICA and he was placed on ASA and brillinta due to stent. Pt. was transferred to inpatient rehab for several weeks, had home health therapy upon discharge, and now is ready for outpatient therapy services.    Limitations  Dominant LUE functioning, left hand edema, distal ROM, coordination, and overal strength.    Patient Stated Goals  To regain the use of his LUE.    Currently in Pain?  No/denies      OT TREATMENT    Neuro muscular re-education:  Pt. worked on improving [facility] skills grasping 1/4" pegs with his 2nd digit, and thumb using 2pt. Pinch. Pt. worked on removing the pegs while alternating thumb opposition to the tip of his 2nd through 5th digit. Pt. worked on bilateral hand coordination skills untying knots a on a medium thickness rope.                           OT Education - 05/28/18 [phone]    Education provided  Yes  Education Details  LUE functioning    Person(s) Educated  Patient    Methods  Explanation;Demonstration;Verbal cues    Comprehension  Verbalized understanding;Returned demonstration          OT Long Term Goals - 05/19/18 0951      OT LONG TERM GOAL #6   Title  Pt. will improve left hand coordination skills to be able to button clothing.    Baseline  05/19/2018: Pt. has limited left hand FMC, speed, and dexterity, Pt. continues to have difficulty with buttoning.    Time  12    Period  Weeks    Status  On-going    Target Date  08/11/18      OT LONG TERM GOAL #8   Title  Pt. will increase Left pinch strength to be able to open a bag of chips.    Baseline  05/19/2018: Pt. is able to grasp items more securely now,  however pt. is uable to maintain grasp on potato chip bags.    Time  12    Period  Weeks    Status  Revised    Target Date  08/11/18      OT LONG TERM GOAL  #9   Baseline  Pt. will write one sentence with 75% legibility and efficiently while maintaing grasp on pen 100% of the time.    Time  12    Period  Weeks    Status  On-going    Target Date  08/11/18      OT LONG TERM GOAL  #10   TITLE  Pt. will independently use his left hand to grasp and efficiently accomodate to the varying weights of ADL objects during self care tasks.     Baseline  05/19/2018: Pt. continues to work on grasping and adjusting his hand to the varying weights of objects.    Time  12    Period  Weeks    Status  On-going    Target Date  08/11/18      OT LONG TERM GOAL  #11   TITLE  Pt. will improve [facility] skills to be able to independently manipulate items for ADLs/IADLs when UEs are elevated when reaching into the closet, or microwave.    Baseline  05/19/2018: Pt. continues to have difficulty manipulating objects when UEs elevated     Time  12    Period  Weeks    Status  On-going    Target Date  08/11/18            Plan - 05/28/18 0944    Clinical Impression Statement  Pt. reports noticing stiffness in his left hand in the morning. Pt. continues to wear the hand splint at night, reporting that he notices flexor tightness in the hand when he does not wear it. Pt. continues to work on improving Left hand function during ADLS, and IADLs.     Occupational performance deficits (Please refer to evaluation for details):  ADL's;IADL's    Rehab Potential  Excellent    OT Frequency  2x / week    OT Duration  12 weeks    OT Treatment/Interventions  Self-care/ADL training;Therapeutic exercise;Neuromuscular education;Patient/family education;Energy conservation;Therapeutic activities;DME and/or AE instruction    Clinical Decision Making  Several treatment options, min-mod task modification necessary    Consulted and  Agree with Plan of Care  Patient       Patient will benefit from skilled therapeutic intervention in order to improve the following deficits and impairments:  Abnormal gait, Pain, Decreased strength, Decreased range of motion, Impaired UE functional use, Decreased coordination, Decreased knowledge of use of DME, Decreased activity tolerance, Difficulty walking, Decreased balance, Other (comment), Decreased knowledge of precautions  Visit Diagnosis: Muscle weakness (generalized)  Other lack of coordination    Problem List Patient Active Problem List   Diagnosis Date Noted  . History of stroke 02/24/2018  . Pure hypercholesterolemia 05/28/2017  . Erectile dysfunction 05/28/2017  . Left hemiparesis (HCC)   . Essential hypertension 11/29/2016  . 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 Hepatitis C 07/22/2013    Olegario Messier, MS, OTR/L 05/28/2018, 10:06 AM  [place] [facility] MAIN [facility] SERVICES [address] [place], [place], [address] Phone: [phone]   Fax:  [phone]  Name: Jason Nolan MRN: [id number] Date of Birth: 1945-12-14

## 2018-05-29 ENCOUNTER — Encounter (HOSPITAL_COMMUNITY): Payer: Medicare Other

## 2018-06-02 ENCOUNTER — Ambulatory Visit: Payer: Medicare Other | Admitting: Occupational Therapy

## 2018-06-02 ENCOUNTER — Encounter: Payer: Self-pay | Admitting: Occupational Therapy

## 2018-06-02 DIAGNOSIS — M6281 Muscle weakness (generalized): Secondary | ICD-10-CM

## 2018-06-02 DIAGNOSIS — R278 Other lack of coordination: Secondary | ICD-10-CM | POA: Diagnosis not present

## 2018-06-02 NOTE — Therapy (Signed)
[place] [facility] [address] [place], [place], [address] Phone: [phone]   Fax:  [phone]  Occupational Therapy Treatment  Patient Details  Name: Jason CroakJames Nolan MRN: [id number] Date of Birth: 07-09-1945 No data recorded  Encounter Date: 06/02/2018  OT End of Session - 06/02/18 0953    Visit Number  103    Number of Visits  120    Date for OT Re-Evaluation  08/11/18    Authorization Type  Progress reporting period starting 05/19/2018    OT Start Time  0937    OT Stop Time  1015    OT Time Calculation (min)  38 min    Activity Tolerance  Patient tolerated treatment well    Behavior During Therapy  [facility] for tasks assessed/performed       Past Medical History:  Diagnosis Date  . Glaucoma   . Hepatitis C   . Stroke [facility](HCC)     Past Surgical History:  Procedure Laterality Date  . IR ANGIO INTRA EXTRACRAN SEL COM CAROTID INNOMINATE UNI L MOD SED  11/25/2016  . IR ANGIO VERTEBRAL SEL SUBCLAVIAN INNOMINATE UNI R MOD SED  11/25/2016  . IR ANGIO VERTEBRAL SEL VERTEBRAL UNI L MOD SED  11/25/2016  . IR INTRAVSC STENT CERV CAROTID W/O EMB-PROT MOD SED INC ANGIO  11/25/2016  . IR PERCUTANEOUS ART THROMBECTOMY/INFUSION INTRACRANIAL INC DIAG ANGIO  11/25/2016  . IR RADIOLOGIST EVAL & MGMT  02/13/2017  . RADIOLOGY WITH ANESTHESIA N/A 11/25/2016   Procedure: RADIOLOGY WITH ANESTHESIA;  Surgeon: Radiologist, Medication, MD;  [facility]: [facility] OR;  Service: Radiology;  Laterality: N/A;    There were no vitals filed for this visit.  Subjective Assessment - 06/02/18 0951    Subjective   Pt[place] going to [place] tomorrow.    Patient is accompained by:  Family member    Pertinent History  Pt. is a 73 y.o. 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 [facility] He underwent CTA brain showed short segment near occlusion of proximal R-ICA with associated intraluminal thrombus --likely due to dissection and emergent  large vessel occlusion with right M2 occlusion and evolving right frontal lobe infarct.  He underwent cerebral angio with  complete revascularization of occluded superior division of R-MCA and near complete revascularization of proximal R-ICA with stent assisted angioplasty.  Stroke felt to be embolic in setting of ruptured plaque R-ICA and he was placed on ASA and brillinta due to stent. Pt. was transferred to inpatient rehab for several weeks, had home health therapy upon discharge, and now is ready for outpatient therapy services.    Limitations  Dominant LUE functioning, left hand edema, distal ROM, coordination, and overal strength.    Patient Stated Goals  To regain the use of his LUE.    Currently in Pain?  No/denies       OT TREATMENT    Neuro muscular re-education:  Pt. worked on grasping, and manipulating 1/2" washers from a magnetic dish using point grasp pattern. Pt. worked on reaching up, stabilizing, and sustaining shoulder elevation while placing the washer over a small precise target on vertical dowels positioned at various angles. Pt. worked on grasping 1/4" small pegs, performing translatory movements and placing them in a small pegboard.                         OT Education - 06/02/18 [phone]    Education provided  Yes    Education Details  LUE functioning    Person(s) Educated  Patient    Methods  Explanation;Demonstration;Verbal cues    Comprehension  Verbalized understanding;Returned demonstration          OT Long Term Goals - 05/19/18 0951      OT LONG TERM GOAL #6   Title  Pt. will improve left hand coordination skills to be able to button clothing.    Baseline  05/19/2018: Pt. has limited left hand FMC, speed, and dexterity, Pt. continues to have difficulty with buttoning.    Time  12    Period  Weeks    Status  On-going    Target Date  08/11/18      OT LONG TERM GOAL #8   Title  Pt. will increase Left pinch strength to be able to open a  bag of chips.    Baseline  05/19/2018: Pt. is able to grasp items more securely now, however pt. is uable to maintain grasp on potato chip bags.    Time  12    Period  Weeks    Status  Revised    Target Date  08/11/18      OT LONG TERM GOAL  #9   Baseline  Pt. will write one sentence with 75% legibility and efficiently while maintaing grasp on pen 100% of the time.    Time  12    Period  Weeks    Status  On-going    Target Date  08/11/18      OT LONG TERM GOAL  #10   TITLE  Pt. will independently use his left hand to grasp and efficiently accomodate to the varying weights of ADL objects during self care tasks.     Baseline  05/19/2018: Pt. continues to work on grasping and adjusting his hand to the varying weights of objects.    Time  12    Period  Weeks    Status  On-going    Target Date  08/11/18      OT LONG TERM GOAL  #11   TITLE  Pt. will improve [facility] skills to be able to independently manipulate items for ADLs/IADLs when UEs are elevated when reaching into the closet, or microwave.    Baseline  05/19/2018: Pt. continues to have difficulty manipulating objects when UEs elevated     Time  12    Period  Weeks    Status  On-going    Target Date  08/11/18            Plan - 06/02/18 0954    Clinical Impression Statement  Pt. has made progress overall with LUE strength, and FMC skills. Pt. continues to work on improving translatory movements of the left hand in order to be able to manipulate objects during ADLs, and IADL tasks.     Occupational performance deficits (Please refer to evaluation for details):  ADL's;IADL's    Rehab Potential  Excellent    OT Frequency  2x / week    OT Duration  12 weeks    OT Treatment/Interventions  Self-care/ADL training;Therapeutic exercise;Neuromuscular education;Patient/family education;Energy conservation;Therapeutic activities;DME and/or AE instruction    Clinical Decision Making  Several treatment options, min-mod task modification  necessary    Consulted and Agree with Plan of Care  Patient       Patient will benefit from skilled therapeutic intervention in order to improve the following deficits and impairments:  Abnormal gait, Pain, Decreased strength, Decreased range of  motion, Impaired UE functional use, Decreased coordination, Decreased knowledge of use of DME, Decreased activity tolerance, Difficulty walking, Decreased balance, Other (comment), Decreased knowledge of precautions  Visit Diagnosis: Muscle weakness (generalized)  Other lack of coordination    Problem List Patient Active Problem List   Diagnosis Date Noted  . History of stroke 02/24/2018  . Pure hypercholesterolemia 05/28/2017  . Erectile dysfunction 05/28/2017  . Left hemiparesis (HCC)   . Essential hypertension 11/29/2016  . 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 Hepatitis C 07/22/2013    Olegario Messier, MS, OTR/L 06/02/2018, 10:16 AM  [place] [facility] MAIN [facility] SERVICES [address] [place], [place], [address] Phone: [phone]   Fax:  [phone]  Name: Jason Nolan MRN: [id number] Date of Birth: [birth year]

## 2018-06-04 ENCOUNTER — Ambulatory Visit: Payer: Medicare Other | Admitting: Occupational Therapy

## 2018-06-09 ENCOUNTER — Ambulatory Visit: Payer: Medicare Other | Admitting: Occupational Therapy

## 2018-06-11 ENCOUNTER — Encounter: Payer: Self-pay | Admitting: Occupational Therapy

## 2018-06-11 ENCOUNTER — Ambulatory Visit: Payer: Medicare Other | Attending: Internal Medicine | Admitting: Occupational Therapy

## 2018-06-11 DIAGNOSIS — R278 Other lack of coordination: Secondary | ICD-10-CM

## 2018-06-11 DIAGNOSIS — M6281 Muscle weakness (generalized): Secondary | ICD-10-CM | POA: Diagnosis not present

## 2018-06-11 NOTE — Therapy (Signed)
[facility] [address] [place], [place], [address] Phone: [phone]   Fax:  [phone]  Occupational Therapy Treatment  Patient Details  Name: Jason CroakJames Nolan MRN: [id number] Date of Birth: 1946/02/25 No data recorded  Encounter Date: 06/11/2018  OT End of Session - 06/11/18 0941    Visit Number  104    Number of Visits  120    Date for OT Re-Evaluation  08/11/18    Authorization Type  Progress reporting period starting 05/19/2018    OT Start Time  0930    OT Stop Time  1015    OT Time Calculation (min)  45 min    Activity Tolerance  Patient tolerated treatment well    Behavior During Therapy  [facility] for tasks assessed/performed       Past Medical History:  Diagnosis Date  . Glaucoma   . Hepatitis C   . Stroke [facility](HCC)     Past Surgical History:  Procedure Laterality Date  . IR ANGIO INTRA EXTRACRAN SEL COM CAROTID INNOMINATE UNI L MOD SED  11/25/2016  . IR ANGIO VERTEBRAL SEL SUBCLAVIAN INNOMINATE UNI R MOD SED  11/25/2016  . IR ANGIO VERTEBRAL SEL VERTEBRAL UNI L MOD SED  11/25/2016  . IR INTRAVSC STENT CERV CAROTID W/O EMB-PROT MOD SED INC ANGIO  11/25/2016  . IR PERCUTANEOUS ART THROMBECTOMY/INFUSION INTRACRANIAL INC DIAG ANGIO  11/25/2016  . IR RADIOLOGIST EVAL & MGMT  02/13/2017  . RADIOLOGY WITH ANESTHESIA N/A 11/25/2016   Procedure: RADIOLOGY WITH ANESTHESIA;  Surgeon: Radiologist, Medication, MD;  Location: [facility] OR;  Service: Radiology;  Laterality: N/A;    There were no vitals filed for this visit.  Subjective Assessment - 06/11/18 0940    Subjective   Pt. Reports that he did not play golf while in [place] this past week.    Patient is accompained by:  Family member    Pertinent History  Pt. is a 73 y.o. 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 [facility] He underwent CTA brain showed short segment near occlusion of proximal R-ICA with associated intraluminal thrombus --likely  due to dissection and emergent large vessel occlusion with right M2 occlusion and evolving right frontal lobe infarct.  He underwent cerebral angio with  complete revascularization of occluded superior division of R-MCA and near complete revascularization of proximal R-ICA with stent assisted angioplasty.  Stroke felt to be embolic in setting of ruptured plaque R-ICA and he was placed on ASA and brillinta due to stent. Pt. was transferred to inpatient rehab for several weeks, had home health therapy upon discharge, and now is ready for outpatient therapy services.    Limitations  Dominant LUE functioning, left hand edema, distal ROM, coordination, and overal strength.    Currently in Pain?  No/denies      OT TREATMENT    Neuro muscular re-education:  Pt. worked on using his left hand grasping, and manipulating with 1/2" washers from a magnetic dish using point grasp pattern. Pt. worked on storing the coins in his hand, and performing translatory movements. Pt. worked on reaching up, stabilizing, and sustaining shoulder elevation while placing the washer over a small precise target on vertical dowels positioned at various angles. Pt. worked on grasping coins from a tabletop surface, placing them into a resistive container, working on translatory movements of the hand moving them to the tip of his 2nd digit, and thumb. Pt. worked on pushing them through the  slot while isolating his left 2nd digit.                           OT Education - 06/11/18 0941    Education provided  Yes    Education Details  LUE functioning    Person(s) Educated  Patient    Methods  Explanation;Demonstration;Verbal cues    Comprehension  Verbalized understanding;Returned demonstration          OT Long Term Goals - 05/19/18 0951      OT LONG TERM GOAL #6   Title  Pt. will improve left hand coordination skills to be able to button clothing.    Baseline  05/19/2018: Pt. has limited left hand FMC,  speed, and dexterity, Pt. continues to have difficulty with buttoning.    Time  12    Period  Weeks    Status  On-going    Target Date  08/11/18      OT LONG TERM GOAL #8   Title  Pt. will increase Left pinch strength to be able to open a bag of chips.    Baseline  05/19/2018: Pt. is able to grasp items more securely now, however pt. is uable to maintain grasp on potato chip bags.    Time  12    Period  Weeks    Status  Revised    Target Date  08/11/18      OT LONG TERM GOAL  #9   Baseline  Pt. will write one sentence with 75% legibility and efficiently while maintaing grasp on pen 100% of the time.    Time  12    Period  Weeks    Status  On-going    Target Date  08/11/18      OT LONG TERM GOAL  #10   TITLE  Pt. will independently use his left hand to grasp and efficiently accomodate to the varying weights of ADL objects during self care tasks.     Baseline  05/19/2018: Pt. continues to work on grasping and adjusting his hand to the varying weights of objects.    Time  12    Period  Weeks    Status  On-going    Target Date  08/11/18      OT LONG TERM GOAL  #11   TITLE  Pt. will improve [facility] skills to be able to independently manipulate items for ADLs/IADLs when UEs are elevated when reaching into the closet, or microwave.    Baseline  05/19/2018: Pt. continues to have difficulty manipulating objects when UEs elevated     Time  12    Period  Weeks    Status  On-going    Target Date  08/11/18            Plan - 06/11/18 0949    Clinical Impression Statement  Pt. returned from a trip to [place] this past week. Pt. plans to go to [place] next month to see a [employer] play. Pt. continues to present with limited LUE strength, motor control, and [facility] skills including translatory movements of the hand. Pt. continues to work on improving LUE functioning in order to be able to button a shirt, open a bag of chips. and write legibly.      Occupational performance deficits (Please refer to  evaluation for details):  ADL's;IADL's    Rehab Potential  Excellent    OT Frequency  2x / week    OT  Duration  12 weeks    OT Treatment/Interventions  Self-care/ADL training;Therapeutic exercise;Neuromuscular education;Patient/family education;Energy conservation;Therapeutic activities;DME and/or AE instruction    Clinical Decision Making  Several treatment options, min-mod task modification necessary    Consulted and Agree with Plan of Care  Patient       Patient will benefit from skilled therapeutic intervention in order to improve the following deficits and impairments:  Abnormal gait, Pain, Decreased strength, Decreased range of motion, Impaired UE functional use, Decreased coordination, Decreased knowledge of use of DME, Decreased activity tolerance, Difficulty walking, Decreased balance, Other (comment), Decreased knowledge of precautions  Visit Diagnosis: Muscle weakness (generalized)  Other lack of coordination    Problem List Patient Active Problem List   Diagnosis Date Noted  . History of stroke 02/24/2018  . Pure hypercholesterolemia 05/28/2017  . Erectile dysfunction 05/28/2017  . Left hemiparesis (HCC)   . Essential hypertension 11/29/2016  . 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 Hepatitis C 07/22/2013    Olegario Messier, MS, OTR/L 06/11/2018, 10:04 AM  [facility] MAIN [facility] SERVICES [address] [place], [place], [address] Phone: [phone]   Fax:  [phone]  Name: Jason Nolan MRN: [id number] Date of Birth: [birth year]

## 2018-06-12 ENCOUNTER — Ambulatory Visit (HOSPITAL_COMMUNITY)
Admission: RE | Admit: 2018-06-12 | Discharge: 2018-06-12 | Disposition: A | Discharge status: Home / Self Care | Payer: Medicare Other | Source: Ambulatory Visit | Attending: Interventional Radiology | Admitting: Interventional Radiology

## 2018-06-12 DIAGNOSIS — I771 Stricture of artery: Secondary | ICD-10-CM | POA: Diagnosis not present

## 2018-06-12 NOTE — Progress Notes (Signed)
Carotid artery duplex has been completed. Preliminary results can be found in CV Proc through chart review.   06/12/18 2:13 PM Olen Cordial RVT

## 2018-06-15 ENCOUNTER — Telehealth (HOSPITAL_COMMUNITY): Payer: Self-pay

## 2018-06-15 NOTE — Telephone Encounter (Signed)
Pt agreed to f/u in 6 months with us carotid. AW 

## 2018-06-16 ENCOUNTER — Encounter: Payer: Self-pay | Admitting: Occupational Therapy

## 2018-06-16 ENCOUNTER — Ambulatory Visit: Payer: Medicare Other | Admitting: Occupational Therapy

## 2018-06-16 DIAGNOSIS — M6281 Muscle weakness (generalized): Secondary | ICD-10-CM

## 2018-06-16 DIAGNOSIS — R278 Other lack of coordination: Secondary | ICD-10-CM | POA: Diagnosis not present

## 2018-06-16 NOTE — Therapy (Signed)
[facility] MAIN [facility] SERVICES [address] [place], [place], [address] Phone: [phone]   Fax:  [phone]  Occupational Therapy Treatment  Patient Details  Name: Jason Nolan MRN: [id number] Date of Birth: Jul 15, 1945 No data recorded  Encounter Date: 06/16/2018  OT End of Session - 06/16/18 0941    Visit Number  105    Number of Visits  120    Date for OT Re-Evaluation  08/11/18    Authorization Type  Progress reporting period starting 05/19/2018    OT Start Time  0930    OT Stop Time  1015    OT Time Calculation (min)  45 min    Activity Tolerance  Patient tolerated treatment well    Behavior During Therapy  [facility] for tasks assessed/performed       Past Medical History:  Diagnosis Date  . Glaucoma   . Hepatitis C   . [facility]     Past Surgical History:  Procedure Laterality Date  . IR ANGIO INTRA EXTRACRAN SEL COM CAROTID INNOMINATE UNI L MOD SED  11/25/2016  . IR ANGIO VERTEBRAL SEL SUBCLAVIAN INNOMINATE UNI R MOD SED  11/25/2016  . IR ANGIO VERTEBRAL SEL VERTEBRAL UNI L MOD SED  11/25/2016  . IR INTRAVSC STENT CERV CAROTID W/O EMB-PROT MOD SED INC ANGIO  11/25/2016  . IR PERCUTANEOUS ART THROMBECTOMY/INFUSION INTRACRANIAL INC DIAG ANGIO  11/25/2016  . IR RADIOLOGIST EVAL & MGMT  02/13/2017  . RADIOLOGY WITH ANESTHESIA N/A 11/25/2016   Procedure: RADIOLOGY WITH ANESTHESIA;  Surgeon: Radiologist, Medication, MD;  Location: [facility] OR;  Service: Radiology;  Laterality: N/A;    There were no vitals filed for this visit.  Subjective Assessment - 06/16/18 0934    Subjective   Pt. is planning to go     Patient is accompained by:  Family member    Pertinent History  Pt. is a 73 y.o. 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 Pt. was transferred to [facility]. He underwent CTA brain showed short segment near occlusion of proximal R-ICA with associated intraluminal thrombus --likely due to dissection and emergent large vessel  occlusion with right M2 occlusion and evolving right frontal lobe infarct.  He underwent cerebral angio with  complete revascularization of occluded superior division of R-MCA and near complete revascularization of proximal R-ICA with stent assisted angioplasty.  Stroke felt to be embolic in setting of ruptured plaque R-ICA and he was placed on ASA and brillinta due to stent. Pt. was transferred to inpatient rehab for several weeks, had home health therapy upon discharge, and now is ready for outpatient therapy services.    Limitations  Dominant LUE functioning, left hand edema, distal ROM, coordination, and overal strength.    Patient Stated Goals  To regain the use of his LUE.    Currently in Pain?  No/denies    Pain Score  2     Pain Location  Head    Pain Orientation  Right    Pain Descriptors / Indicators  Aching;Dull    Pain Type  Acute pain    Pain Onset  1 to 4 weeks ago    Multiple Pain Sites  No       OT TREATMENT    Neuro muscular re-education:  Pt. worked on left [facility] skills translatory movements of the hand moving  1/2" flat marbles from his palm to the tip of his second digit, and thumb. Pt. progressed grasping, and moving small pegs  into a pegboard. Pt. Required cues to avoid compensating proximally with leaning to the right.  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 Pt. With positive deviation below the line, and towards the end of each line. Pt. used a standard pen with a small built-up foam grip.  Pt.'s left 3rd digit does not engage in supporting the pen during writing. The foam pen grip was change to a tripod pen gel grip. Pt. Reports movement of the pen within his hand.                         OT Education - 06/16/18 0940    Education provided  Yes    Education Details  LUE  functioning    Person(s) Educated  Patient    Methods  Explanation;Demonstration;Verbal cues    Comprehension  Verbalized understanding;Returned demonstration          OT Long Term Goals - 05/19/18 0951      OT LONG TERM GOAL #6   Title  Pt. will improve left hand coordination skills to be able to button clothing.    Baseline  05/19/2018: Pt. has limited left hand FMC, speed, and dexterity, Pt. continues to have difficulty with buttoning.    Time  12    Period  Weeks    Status  On-going    Target Date  08/11/18      OT LONG TERM GOAL #8   Title  Pt. will increase Left pinch strength to be able to open a bag of chips.    Baseline  05/19/2018: Pt. is able to grasp items more securely now, however pt. is uable to maintain grasp on potato chip bags.    Time  12    Period  Weeks    Status  Revised    Target Date  08/11/18      OT LONG TERM GOAL  #9   Baseline  Pt. will write one sentence with 75% legibility and efficiently while maintaing grasp on pen 100% of the time.    Time  12    Period  Weeks    Status  On-going    Target Date  08/11/18      OT LONG TERM GOAL  #10   TITLE  Pt. will independently use his left hand to grasp and efficiently accomodate to the varying weights of ADL objects during self care tasks.     Baseline  05/19/2018: Pt. continues to work on grasping and adjusting his hand to the varying weights of objects.    Time  12    Period  Weeks    Status  On-going    Target Date  08/11/18      OT LONG TERM GOAL  #11   TITLE  Pt. will improve [facility] skills to be able to independently manipulate items for ADLs/IADLs when UEs are elevated when reaching into the closet, or microwave.    Baseline  05/19/2018: Pt. continues to have difficulty manipulating objects when UEs elevated     Time  12    Period  Weeks    Status  On-going    Target Date  08/11/18            Plan - 06/16/18 0943    Clinical Impression Statement Pt. reports that he has  engaged his right  hand more during ADL, and IADL tasks at home, and is using the hand more. Pt. is using his hand to fold clothes more at home. Pt. reports that his left moddle finger does not stabilize a pen when writing. Pt. continues to work on improving [facility] skills with emphasis placed on  translatory movements of the hand in oreder to improve buttoning, writing, and opening chip bags.     Occupational performance deficits (Please refer to evaluation for details):  ADL's;IADL's    Rehab Potential  Excellent    OT Frequency  2x / week    OT Duration  12 weeks    OT Treatment/Interventions  Self-care/ADL training;Therapeutic exercise;Neuromuscular education;Patient/family education;Energy conservation;Therapeutic activities;DME and/or AE instruction    Clinical Decision Making  Several treatment options, min-mod task modification necessary    Consulted and Agree with Plan of Care  Patient       Patient will benefit from skilled therapeutic intervention in order to improve the following deficits and impairments:  Abnormal gait, Pain, Decreased strength, Decreased range of motion, Impaired UE functional use, Decreased coordination, Decreased knowledge of use of DME, Decreased activity tolerance, Difficulty walking, Decreased balance, Other (comment), Decreased knowledge of precautions  Visit Diagnosis: Muscle weakness (generalized)  Other lack of coordination    Problem List Patient Active Problem List   Diagnosis Date Noted  . History of stroke 02/24/2018  . Pure hypercholesterolemia 05/28/2017  . Erectile dysfunction 05/28/2017  . Left hemiparesis (HCC)   . Essential hypertension 11/29/2016  . 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 Hepatitis C 07/22/2013    Olegario MessierElaine Bettylou Frew, MS, OTR/L 06/16/2018, 9:56 AM  [place] MAIN [facility] [address] [place], [place], [address] Phone: [phone]   Fax:  [phone]  Name: Jason CroakJames Nolan MRN: [id number] Date of Birth: 11/10/1945

## 2018-06-18 ENCOUNTER — Ambulatory Visit: Payer: Medicare Other | Admitting: Occupational Therapy

## 2018-06-18 ENCOUNTER — Encounter: Payer: Self-pay | Admitting: Occupational Therapy

## 2018-06-18 DIAGNOSIS — R278 Other lack of coordination: Secondary | ICD-10-CM | POA: Diagnosis not present

## 2018-06-18 DIAGNOSIS — M6281 Muscle weakness (generalized): Secondary | ICD-10-CM

## 2018-06-19 DIAGNOSIS — H401122 Primary open-angle glaucoma, left eye, moderate stage: Secondary | ICD-10-CM | POA: Diagnosis not present

## 2018-06-19 DIAGNOSIS — H10413 Chronic giant papillary conjunctivitis, bilateral: Secondary | ICD-10-CM | POA: Diagnosis not present

## 2018-06-19 DIAGNOSIS — H401113 Primary open-angle glaucoma, right eye, severe stage: Secondary | ICD-10-CM | POA: Diagnosis not present

## 2018-06-19 DIAGNOSIS — H25813 Combined forms of age-related cataract, bilateral: Secondary | ICD-10-CM | POA: Diagnosis not present

## 2018-06-20 NOTE — Therapy (Signed)
[place] [facility] MAIN [facility] SERVICES [address] [place], [place], [address] Phone: [phone]   Fax:  [phone]  Occupational Therapy Treatment  Patient Details  Name: Jason Nolan MRN: [id number] Date of Birth: 06-Oct-1945 No data recorded  Encounter Date: 06/18/2018  OT End of Session - 06/20/18 0958    Visit Number  106    Number of Visits  120    Date for OT Re-Evaluation  08/11/18    Authorization Type  Progress reporting period starting 05/19/2018    OT Start Time  0930    OT Stop Time  1020    OT Time Calculation (min)  50 min    Activity Tolerance  Patient tolerated treatment well    Behavior During Therapy  [facility] for tasks assessed/performed       Past Medical History:  Diagnosis Date  . Glaucoma   . Hepatitis C   . Stroke [facility])     Past Surgical History:  Procedure Laterality Date  . IR ANGIO INTRA EXTRACRAN SEL COM CAROTID INNOMINATE UNI L MOD SED  11/25/2016  . IR ANGIO VERTEBRAL SEL SUBCLAVIAN INNOMINATE UNI R MOD SED  11/25/2016  . IR ANGIO VERTEBRAL SEL VERTEBRAL UNI L MOD SED  11/25/2016  . IR INTRAVSC STENT CERV CAROTID W/O EMB-PROT MOD SED INC ANGIO  11/25/2016  . IR PERCUTANEOUS ART THROMBECTOMY/INFUSION INTRACRANIAL INC DIAG ANGIO  11/25/2016  . IR RADIOLOGIST EVAL & MGMT  02/13/2017  . RADIOLOGY WITH ANESTHESIA N/A 11/25/2016   Procedure: RADIOLOGY WITH ANESTHESIA;  Surgeon: Radiologist, Medication, MD;  Location: [facility] OR;  Service: Radiology;  Laterality: N/A;    There were no vitals filed for this visit.  Subjective Assessment - 06/20/18 0957    Subjective   Patient reports he has been working on grip and putty as well as opposition to small finger.  Difficulty with stabilizing fork in left hand when cutting with right.      Pertinent History  Pt. is a 73 y.o. 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 Pt. was transferred to [facility]. He underwent CTA brain showed short segment near occlusion of proximal  R-ICA with associated intraluminal thrombus --likely due to dissection and emergent large vessel occlusion with right M2 occlusion and evolving right frontal lobe infarct.  He underwent cerebral angio with  complete revascularization of occluded superior division of R-MCA and near complete revascularization of proximal R-ICA with stent assisted angioplasty.  Stroke felt to be embolic in setting of ruptured plaque R-ICA and he was placed on ASA and brillinta due to stent. Pt. was transferred to inpatient rehab for several weeks, had home health therapy upon discharge, and now is ready for outpatient therapy services.    Limitations  Dominant LUE functioning, left hand edema, distal ROM, coordination, and overal strength.    Patient Stated Goals  To regain the use of his LUE.         Therex:  Patient was seen this date with focus on reinforcing lateral pinch in his left hand to be able to stabilize a fork while cutting with his right hand. Patient performed resistive pinch pins to place onto an elevated surface with left upper extremity with cues to facilitate lateral pinch, patient able to do all levels of the resistance but had the most difficulty with the black resistance. Patient to continue with resistive exercises for pinch at home with his resistive putty     Neuromuscular Reeducation: Patient seen for focus  on hand writing with analysis of his pen grasp. Patient has difficulty with demonstrating a secure tripod grasp with his left hand and tends to move towards a thumb wrap grasp of a writing utensil. Utilized a medium grip marker and placed dycem on the end to secure his index and thumb onto the surface. Worked towards movements promoting flexion and extension of the digits as well as isolated movements of the thumb and index and then in conjunction with one another.  Focused on patient being able to self reflect and analyze his pen grasp and when he notices that he is moving into more of a thumb  wrap that he corrects this grasp and goes back to facilitation of a tripod grasp on the writing utensil . The patient demonstrates understanding and will continue to work on this skill at home as a part of his home exercise program.   Patient continues to progress with left hand function which is his dominant hand and with the redevelopment of hand writing skills. He has difficulty with demonstrating a secure tripod grasp when holding a writing utensil and benefited from modifications this session to target these areas and work towards facilitation of a more consistent grass. Continue to work towards goals and plan of care to improve left dominant hand function for necessary daily tasks.                   OT Education - 06/20/18 0957    Education provided  Yes    Education Details  lateral pinch skills to stabilize fork when cutting    Person(s) Educated  Patient    Methods  Explanation;Demonstration;Verbal cues    Comprehension  Verbalized understanding;Returned demonstration          OT Long Term Goals - 05/19/18 0951      OT LONG TERM GOAL #6   Title  Pt. will improve left hand coordination skills to be able to button clothing.    Baseline  05/19/2018: Pt. has limited left hand FMC, speed, and dexterity, Pt. continues to have difficulty with buttoning.    Time  12    Period  Weeks    Status  On-going    Target Date  08/11/18      OT LONG TERM GOAL #8   Title  Pt. will increase Left pinch strength to be able to open a bag of chips.    Baseline  05/19/2018: Pt. is able to grasp items more securely now, however pt. is uable to maintain grasp on potato chip bags.    Time  12    Period  Weeks    Status  Revised    Target Date  08/11/18      OT LONG TERM GOAL  #9   Baseline  Pt. will write one sentence with 75% legibility and efficiently while maintaing grasp on pen 100% of the time.    Time  12    Period  Weeks    Status  On-going    Target Date  08/11/18       OT LONG TERM GOAL  #10   TITLE  Pt. will independently use his left hand to grasp and efficiently accomodate to the varying weights of ADL objects during self care tasks.     Baseline  05/19/2018: Pt. continues to work on grasping and adjusting his hand to the varying weights of objects.    Time  12    Period  Weeks    Status  On-going    Target Date  08/11/18      OT LONG TERM GOAL  #11   TITLE  Pt. will improve [facility] skills to be able to independently manipulate items for ADLs/IADLs when UEs are elevated when reaching into the closet, or microwave.    Baseline  05/19/2018: Pt. continues to have difficulty manipulating objects when UEs elevated     Time  12    Period  Weeks    Status  On-going    Target Date  08/11/18            Plan - 06/20/18 8590    Clinical Impression Statement  Patient continues to progress with left hand function which is his dominant hand and with the redevelopment of hand writing skills. He has difficulty with demonstrating a secure tripod grasp when holding a writing utensil and benefited from modifications this session to target these areas and work towards facilitation of a more consistent grass. Continue to work towards goals and plan of care to improve left dominant hand function for necessary daily tasks.    Occupational performance deficits (Please refer to evaluation for details):  ADL's;IADL's    Rehab Potential  Excellent    OT Frequency  2x / week    OT Duration  12 weeks    OT Treatment/Interventions  Self-care/ADL training;Therapeutic exercise;Neuromuscular education;Patient/family education;Energy conservation;Therapeutic activities;DME and/or AE instruction    Consulted and Agree with Plan of Care  Patient       Patient will benefit from skilled therapeutic intervention in order to improve the following deficits and impairments:  Abnormal gait, Pain, Decreased strength, Decreased range of motion, Impaired UE functional use, Decreased  coordination, Decreased knowledge of use of DME, Decreased activity tolerance, Difficulty walking, Decreased balance, Other (comment), Decreased knowledge of precautions  Visit Diagnosis: Muscle weakness (generalized)  Other lack of coordination    Problem List Patient Active Problem List   Diagnosis Date Noted  . History of stroke 02/24/2018  . Pure hypercholesterolemia 05/28/2017  . Erectile dysfunction 05/28/2017  . Left hemiparesis (HCC)   . Essential hypertension 11/29/2016  . 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 Hepatitis C 07/22/2013   Jazz Biddy T Arne Cleveland, OTR/L, CLT  Jermeka Schlotterbeck 06/20/2018, 4:07 PM  [place] [facility] MAIN [facility] SERVICES [address] [place], [place], [address] Phone: [phone]   Fax:  [phone]  Name: Jaimy Habecker MRN: [id number] Date of Birth: March 29, 1946

## 2018-06-21 ENCOUNTER — Other Ambulatory Visit: Payer: Self-pay | Admitting: Physical Medicine & Rehabilitation

## 2018-06-23 ENCOUNTER — Ambulatory Visit: Payer: Medicare Other | Admitting: Occupational Therapy

## 2018-06-23 DIAGNOSIS — M6281 Muscle weakness (generalized): Secondary | ICD-10-CM

## 2018-06-23 DIAGNOSIS — R278 Other lack of coordination: Secondary | ICD-10-CM | POA: Diagnosis not present

## 2018-06-23 NOTE — Therapy (Signed)
[place] [facility] [address] [place], [place], [address] Phone: [phone]   Fax:  [phone]  Occupational Therapy Treatment  Patient Details  Name: Jason CroakJames Nolan MRN: [id number] Date of Birth: [birth year] No data recorded  Encounter Date: 06/23/2018  OT End of Session - 06/26/18 1015    Visit Number  107    Number of Visits  120    Date for OT Re-Evaluation  08/11/18    Authorization Type  Progress reporting period starting 05/19/2018    OT Start Time  0930    OT Stop Time  1015    OT Time Calculation (min)  45 min    Activity Tolerance  Patient tolerated treatment well    Behavior During Therapy  [facility] for tasks assessed/performed       Past Medical History:  Diagnosis Date  . Glaucoma   . Hepatitis C   . Stroke [facility](HCC)     Past Surgical History:  Procedure Laterality Date  . IR ANGIO INTRA EXTRACRAN SEL COM CAROTID INNOMINATE UNI L MOD SED  11/25/2016  . IR ANGIO VERTEBRAL SEL SUBCLAVIAN INNOMINATE UNI R MOD SED  11/25/2016  . IR ANGIO VERTEBRAL SEL VERTEBRAL UNI L MOD SED  11/25/2016  . IR INTRAVSC STENT CERV CAROTID W/O EMB-PROT MOD SED INC ANGIO  11/25/2016  . IR PERCUTANEOUS ART THROMBECTOMY/INFUSION INTRACRANIAL INC DIAG ANGIO  11/25/2016  . IR RADIOLOGIST EVAL & MGMT  02/13/2017  . RADIOLOGY WITH ANESTHESIA N/A 11/25/2016   Procedure: RADIOLOGY WITH ANESTHESIA;  Surgeon: Radiologist, Medication, MD;  Location: [facility] OR;  Service: Radiology;  Laterality: N/A;    There were no vitals filed for this visit.  Subjective Assessment - 06/26/18 1015    Subjective   Patient reports he was doing putty exercises last night and fell asleep and the putty got all over his quilt and clothes.  Discussed ways to get the putty out with ice, rubbing alcohol.      Pertinent History  Pt. is a 73 y.o. 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 [facility] He underwent CTA brain showed short segment near  occlusion of proximal R-ICA with associated intraluminal thrombus --likely due to dissection and emergent large vessel occlusion with right M2 occlusion and evolving right frontal lobe infarct.  He underwent cerebral angio with  complete revascularization of occluded superior division of R-MCA and near complete revascularization of proximal R-ICA with stent assisted angioplasty.  Stroke felt to be embolic in setting of ruptured plaque R-ICA and he was placed on ASA and brillinta due to stent. Pt. was transferred to inpatient rehab for several weeks, had home health therapy upon discharge, and now is ready for outpatient therapy services.    Limitations  Dominant LUE functioning, left hand edema, distal ROM, coordination, and overal strength.    Patient Stated Goals  To regain the use of his LUE.    Currently in Pain?  No/denies    Pain Score  0-No pain        Neuromuscular reeducation:  LUE Patient seen this date for focus on prehension patterns with index, thumb and middle finger to work towards fine motor control patterns to pick up and place small pegs into grid with cues to flex at PIP and IP joints really targeting this motion required to control and redevelop handwriting skills.   Difficulty with small pegs to target the motion so graded task to use round ball pegs  to increase surface area of object for fingers, verbal cues and occasional therapist guiding through the motion to establish movement pattern.   Yellow resistive putty for tripod grasp, cues for using fingers for movement and not wrist, followed by extension pattern (same movement as outlined above), multiple trials completed with short rest breaks as needed.   Manipulation of golf ball, holding in supinated hand position and attempting to move ball up into a full finger perched position.  Also performed with soft spongy like ball.  Patient requires cues from therapist as well as tactile cues for facilitation of proper movements of the  fingers.     Handwriting skills to instruct for homework to facilitate above movements and to perform loops and circles for homework.  Patient demonstrates understanding.   Patient continues to progress, focused this week on refining fine motor coordination skills of the left dominant hand since patient continues to demonstrate impairments in this area which affects handwriting and functional hand tasks.                     OT Education - 06/26/18 1015    Education provided  Yes    Education Details  ways to remove putty    Person(s) Educated  Patient    Methods  Explanation;Demonstration;Verbal cues    Comprehension  Verbalized understanding;Returned demonstration          OT Long Term Goals - 05/19/18 0951      OT LONG TERM GOAL #6   Title  Pt. will improve left hand coordination skills to be able to button clothing.    Baseline  05/19/2018: Pt. has limited left hand FMC, speed, and dexterity, Pt. continues to have difficulty with buttoning.    Time  12    Period  Weeks    Status  On-going    Target Date  08/11/18      OT LONG TERM GOAL #8   Title  Pt. will increase Left pinch strength to be able to open a bag of chips.    Baseline  05/19/2018: Pt. is able to grasp items more securely now, however pt. is uable to maintain grasp on potato chip bags.    Time  12    Period  Weeks    Status  Revised    Target Date  08/11/18      OT LONG TERM GOAL  #9   Baseline  Pt. will write one sentence with 75% legibility and efficiently while maintaing grasp on pen 100% of the time.    Time  12    Period  Weeks    Status  On-going    Target Date  08/11/18      OT LONG TERM GOAL  #10   TITLE  Pt. will independently use his left hand to grasp and efficiently accomodate to the varying weights of ADL objects during self care tasks.     Baseline  05/19/2018: Pt. continues to work on grasping and adjusting his hand to the varying weights of objects.    Time  12    Period   Weeks    Status  On-going    Target Date  08/11/18      OT LONG TERM GOAL  #11   TITLE  Pt. will improve [facility] skills to be able to independently manipulate items for ADLs/IADLs when UEs are elevated when reaching into the closet, or microwave.    Baseline  05/19/2018: Pt. continues to have difficulty  manipulating objects when UEs elevated     Time  12    Period  Weeks    Status  On-going    Target Date  08/11/18            Plan - 06/26/18 1015    Clinical Impression Statement  Patient continues to progress, focused this week on refining fine motor coordination skills of the left dominant hand since patient continues to demonstrate impairments in this area which affects handwriting and functional hand tasks.  Continue with reinforcing tripod grasp with improved fine motor coordination control , isolation of small muscle groups in the digits as well as coordination of these groups to impact fine motor coordination skills.      Occupational performance deficits (Please refer to evaluation for details):  ADL's;IADL's    Rehab Potential  Excellent    OT Frequency  2x / week    OT Duration  12 weeks    OT Treatment/Interventions  Self-care/ADL training;Therapeutic exercise;Neuromuscular education;Patient/family education;Energy conservation;Therapeutic activities;DME and/or AE instruction    Consulted and Agree with Plan of Care  Patient       Patient will benefit from skilled therapeutic intervention in order to improve the following deficits and impairments:  Abnormal gait, Pain, Decreased strength, Decreased range of motion, Impaired UE functional use, Decreased coordination, Decreased knowledge of use of DME, Decreased activity tolerance, Difficulty walking, Decreased balance, Other (comment), Decreased knowledge of precautions  Visit Diagnosis: Muscle weakness (generalized)  Other lack of coordination    Problem List Patient Active Problem List   Diagnosis Date Noted  . History  of stroke 02/24/2018  . Pure hypercholesterolemia 05/28/2017  . Erectile dysfunction 05/28/2017  . Left hemiparesis (HCC)   . Essential hypertension 11/29/2016  . 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 Hepatitis C 07/22/2013    T Arne Cleveland, OTR/L, CLT  , 06/26/2018, 10:52 AM  [place] [facility] MAIN [facility] SERVICES [address] [place], [place], [address] Phone: [phone]   Fax:  [phone]  Name: Jason Nolan MRN: [id number] Date of Birth: 1946/02/27

## 2018-06-25 ENCOUNTER — Ambulatory Visit: Payer: Medicare Other | Admitting: Occupational Therapy

## 2018-06-25 DIAGNOSIS — R278 Other lack of coordination: Secondary | ICD-10-CM

## 2018-06-25 DIAGNOSIS — M6281 Muscle weakness (generalized): Secondary | ICD-10-CM

## 2018-06-26 ENCOUNTER — Encounter: Payer: Self-pay | Admitting: Occupational Therapy

## 2018-06-26 NOTE — Therapy (Signed)
[facility] MAIN [facility] SERVICES [address] [place], [place], [address] Phone: [phone]   Fax:  [phone]  Occupational Therapy Treatment  Patient Details  Name: Boniface Rotunno MRN: [id number] Date of Birth: 11-17-1945 No data recorded  Encounter Date: 06/25/2018  OT End of Session - 06/26/18 1358    Visit Number  108    Number of Visits  120    Date for OT Re-Evaluation  08/11/18    Authorization Type  Progress reporting period starting 05/19/2018    OT Start Time  0928    OT Stop Time  1016    OT Time Calculation (min)  48 min    Activity Tolerance  Patient tolerated treatment well    Behavior During Therapy  [facility] for tasks assessed/performed       Past Medical History:  Diagnosis Date  . Glaucoma   . Hepatitis C   . Stroke [facility])     Past Surgical History:  Procedure Laterality Date  . IR ANGIO INTRA EXTRACRAN SEL COM CAROTID INNOMINATE UNI L MOD SED  11/25/2016  . IR ANGIO VERTEBRAL SEL SUBCLAVIAN INNOMINATE UNI R MOD SED  11/25/2016  . IR ANGIO VERTEBRAL SEL VERTEBRAL UNI L MOD SED  11/25/2016  . IR INTRAVSC STENT CERV CAROTID W/O EMB-PROT MOD SED INC ANGIO  11/25/2016  . IR PERCUTANEOUS ART THROMBECTOMY/INFUSION INTRACRANIAL INC DIAG ANGIO  11/25/2016  . IR RADIOLOGIST EVAL & MGMT  02/13/2017  . RADIOLOGY WITH ANESTHESIA N/A 11/25/2016   Procedure: RADIOLOGY WITH ANESTHESIA;  Surgeon: Radiologist, Medication, MD;  Location: [facility] OR;  Service: Radiology;  Laterality: N/A;    There were no vitals filed for this visit.  Subjective Assessment - 06/26/18 1357    Subjective   Patient reports he was able to get some of the putty out of the quilt but still is working on it.     Pertinent History  Pt. is a 73 y.o. 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 Pt. was transferred to [facility]. He underwent CTA brain showed short segment near occlusion of proximal R-ICA with associated intraluminal thrombus --likely due to  dissection and emergent large vessel occlusion with right M2 occlusion and evolving right frontal lobe infarct.  He underwent cerebral angio with  complete revascularization of occluded superior division of R-MCA and near complete revascularization of proximal R-ICA with stent assisted angioplasty.  Stroke felt to be embolic in setting of ruptured plaque R-ICA and he was placed on ASA and brillinta due to stent. Pt. was transferred to inpatient rehab for several weeks, had home health therapy upon discharge, and now is ready for outpatient therapy services.    Limitations  Dominant LUE functioning, left hand edema, distal ROM, coordination, and overal strength.    Patient Stated Goals  To regain the use of his LUE.    Currently in Pain?  No/denies    Pain Score  0-No pain           Therapeutic Exercise Patient seen for finger strengthening with use of yellow resistive putty with finger flexion, extension, ABD, ADD for 2 sets of 10 repetitions each. Push and pull of putty with cues for PIP flexion and extension to simulate movements utilized with holding pen and strokes for handwriting.  Use of  push pins for finger strengthening with moderate resistance to push into board, patient with increased difficulty noted with this task.  Resistive Velcro squares for promotion of reach in a variety  of planes and cues for prehension patterns to place and remove from resistive board.   Neuromuscular reeducation: Manipulation of small objects less than  inch in size and some items about  inch in size.  Patient requires increased focus and deliberate movements to pick up and sort into container.  Cues for prehension patterns and translatory movements of the hand on the left.         Patient demonstrates increased difficulty with finger strengthening task with use of bulletin board with moderate resistance.  Patient continues to work towards refinement of fine motor coordination skills to impact his  handwriting and functional hand use.  Continue to work towards goals in plan of care.            OT Education - 06/26/18 1357    Education provided  Yes    Education Details  fine motor coordination redevelopment    Person(s) Educated  Patient    Methods  Explanation;Demonstration;Verbal cues    Comprehension  Verbalized understanding;Returned demonstration          OT Long Term Goals - 05/19/18 0951      OT LONG TERM GOAL #6   Title  Pt. will improve left hand coordination skills to be able to button clothing.    Baseline  05/19/2018: Pt. has limited left hand FMC, speed, and dexterity, Pt. continues to have difficulty with buttoning.    Time  12    Period  Weeks    Status  On-going    Target Date  08/11/18      OT LONG TERM GOAL #8   Title  Pt. will increase Left pinch strength to be able to open a bag of chips.    Baseline  05/19/2018: Pt. is able to grasp items more securely now, however pt. is uable to maintain grasp on potato chip bags.    Time  12    Period  Weeks    Status  Revised    Target Date  08/11/18      OT LONG TERM GOAL  #9   Baseline  Pt. will write one sentence with 75% legibility and efficiently while maintaing grasp on pen 100% of the time.    Time  12    Period  Weeks    Status  On-going    Target Date  08/11/18      OT LONG TERM GOAL  #10   TITLE  Pt. will independently use his left hand to grasp and efficiently accomodate to the varying weights of ADL objects during self care tasks.     Baseline  05/19/2018: Pt. continues to work on grasping and adjusting his hand to the varying weights of objects.    Time  12    Period  Weeks    Status  On-going    Target Date  08/11/18      OT LONG TERM GOAL  #11   TITLE  Pt. will improve [facility] skills to be able to independently manipulate items for ADLs/IADLs when UEs are elevated when reaching into the closet, or microwave.    Baseline  05/19/2018: Pt. continues to have difficulty manipulating objects  when UEs elevated     Time  12    Period  Weeks    Status  On-going    Target Date  08/11/18            Plan - 06/26/18 1358    Occupational performance deficits (Please refer to evaluation for details):  ADL's;IADL's    Rehab Potential  Excellent    OT Frequency  2x / week    OT Duration  12 weeks    OT Treatment/Interventions  Self-care/ADL training;Therapeutic exercise;Neuromuscular education;Patient/family education;Energy conservation;Therapeutic activities;DME and/or AE instruction    Consulted and Agree with Plan of Care  Patient       Patient will benefit from skilled therapeutic intervention in order to improve the following deficits and impairments:  Abnormal gait, Pain, Decreased strength, Decreased range of motion, Impaired UE functional use, Decreased coordination, Decreased knowledge of use of DME, Decreased activity tolerance, Difficulty walking, Decreased balance, Other (comment), Decreased knowledge of precautions  Visit Diagnosis: Muscle weakness (generalized)  Other lack of coordination    Problem List Patient Active Problem List   Diagnosis Date Noted  . History of stroke 02/24/2018  . Pure hypercholesterolemia 05/28/2017  . Erectile dysfunction 05/28/2017  . Left hemiparesis (HCC)   . Essential hypertension 11/29/2016  . 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 Hepatitis C 07/22/2013   Ulysee Fyock T Arne ClevelandLovett, OTR/L, CLT  Elizabet Schweppe 06/26/2018, 2:00 PM  [facility]IN [facility] [address] [place], [place], [address] Phone: [phone]   Fax:  [phone]  Name: Terrilee CroakJames Oleksy MRN: [id number] Date of Birth: December 26, 1945

## 2018-06-30 ENCOUNTER — Encounter: Payer: Self-pay | Admitting: Occupational Therapy

## 2018-06-30 ENCOUNTER — Ambulatory Visit: Payer: Medicare Other | Admitting: Occupational Therapy

## 2018-06-30 DIAGNOSIS — R278 Other lack of coordination: Secondary | ICD-10-CM | POA: Diagnosis not present

## 2018-06-30 DIAGNOSIS — M6281 Muscle weakness (generalized): Secondary | ICD-10-CM

## 2018-06-30 NOTE — Therapy (Signed)
[facility] MAIN [facility] SERVICES [address] [place], [place], [address] Phone: [phone]   Fax:  [phone]  Occupational Therapy Treatment  Patient Details  Name: Jason Nolan MRN: [id number] Date of Birth: 1945-12-24 No data recorded  Encounter Date: 06/30/2018  OT End of Session - 07/04/18 0925    Visit Number  109    Number of Visits  120    Date for OT Re-Evaluation  08/11/18    Authorization Type  Progress reporting period starting 05/19/2018    OT Start Time  1000    OT Stop Time  1046    OT Time Calculation (min)  46 min    Activity Tolerance  Patient tolerated treatment well    Behavior During Therapy  [facility] for tasks assessed/performed       Past Medical History:  Diagnosis Date  . Glaucoma   . Hepatitis C   . Stroke [facility])     Past Surgical History:  Procedure Laterality Date  . COLONOSCOPY WITH PROPOFOL N/A 07/01/2018   Procedure: COLONOSCOPY WITH PROPOFOL;  Surgeon: Wyline Mood, MD;  Location: [facility] ENDOSCOPY;  Service: Gastroenterology;  Laterality: N/A;  . IR ANGIO INTRA EXTRACRAN SEL COM CAROTID INNOMINATE UNI L MOD SED  11/25/2016  . IR ANGIO VERTEBRAL SEL SUBCLAVIAN INNOMINATE UNI R MOD SED  11/25/2016  . IR ANGIO VERTEBRAL SEL VERTEBRAL UNI L MOD SED  11/25/2016  . IR INTRAVSC STENT CERV CAROTID W/O EMB-PROT MOD SED INC ANGIO  11/25/2016  . IR PERCUTANEOUS ART THROMBECTOMY/INFUSION INTRACRANIAL INC DIAG ANGIO  11/25/2016  . IR RADIOLOGIST EVAL & MGMT  02/13/2017  . RADIOLOGY WITH ANESTHESIA N/A 11/25/2016   Procedure: RADIOLOGY WITH ANESTHESIA;  Surgeon: Radiologist, Medication, MD;  Location: [facility] OR;  Service: Radiology;  Laterality: N/A;    There were no vitals filed for this visit.  Subjective Assessment - 07/04/18 0923    Subjective   Patient reports he has to have a colonscopy this week and hopes he can still make it to his appt on Thursday for OT.  He has continued to work on exercises at home and feels things  are going well, just more slowly than he would like.     Pertinent History  Pt. is a 73 y.o. 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 Pt. was transferred to [facility]. He underwent CTA brain showed short segment near occlusion of proximal R-ICA with associated intraluminal thrombus --likely due to dissection and emergent large vessel occlusion with right M2 occlusion and evolving right frontal lobe infarct.  He underwent cerebral angio with  complete revascularization of occluded superior division of R-MCA and near complete revascularization of proximal R-ICA with stent assisted angioplasty.  Stroke felt to be embolic in setting of ruptured plaque R-ICA and he was placed on ASA and brillinta due to stent. Pt. was transferred to inpatient rehab for several weeks, had home health therapy upon discharge, and now is ready for outpatient therapy services.    Limitations  Dominant LUE functioning, left hand edema, distal ROM, coordination, and overal strength.    Patient Stated Goals  To regain the use of his LUE.    Currently in Pain?  No/denies    Pain Score  0-No pain    Multiple Pain Sites  No        Neuromuscular reeducation:  Patient seen for focus on isolated finger movements to pull elastic lace through card, cues for prehension pattern.  Patient performing manipulation  of small objects to pick up and place into container, encouragement of flex at IP joint of the thumb and PIP joint of index finger.  Cues from therapist and occasional guiding required.   Therapeutic Exercise: Patient seen this date for focus on tendon gliding exercises with therapist instruction, completed 10 reps of sequenced exercise with cues and therapist demo.  Patient issued HEP through medbridge with handouts.  Patient able to demo understanding and to perform 1-2 times a day.  Patient seen for finger strengthening/pinch with use of staple remover to remove staples from paper, cues for proper finger position to  perform task and to readjust prehension pattern as needed.      Patient has continued to make good progress with focus on smaller isolated finger movements of the left hand to work towards improved manipulation skills and handwriting with greater secure grip and control.  Patient able to demonstrate understanding of recommended tripod grasp and movement patterns and is able to start to recognize when he needs to adjust grasp on tool or pen.  Continue to work towards goals to increase functional hand use for necessary daily tasks.                 OT Education - 07/04/18 [phone]    Education provided  Yes    Education Details  fine motor coordination redevelopment    Person(s) Educated  Patient    Methods  Explanation;Demonstration;Verbal cues    Comprehension  Verbalized understanding;Returned demonstration          OT Long Term Goals - 07/02/18 0947      OT LONG TERM GOAL #6   Title  Pt. will improve left hand coordination skills to be able to button clothing.    Baseline  05/19/2018: Pt. has limited left hand FMC, speed, and dexterity, Pt. continues to have difficulty with buttoning.    Time  12    Period  Weeks    Status  On-going      OT LONG TERM GOAL #8   Title  Pt. will increase Left pinch strength to be able to open a bag of chips.    Baseline  05/19/2018: Pt. is able to grasp items more securely now, however pt. is uable to maintain grasp on potato chip bags.    Time  12    Period  Weeks    Status  Revised      OT LONG TERM GOAL  #9   Baseline  Pt. will write one sentence with 75% legibility and efficiently while maintaing grasp on pen 100% of the time.    Time  12    Period  Weeks    Status  On-going      OT LONG TERM GOAL  #10   TITLE  Pt. will independently use his left hand to grasp and efficiently accomodate to the varying weights of ADL objects during self care tasks.     Baseline  05/19/2018: Pt. continues to work on grasping and adjusting his hand to  the varying weights of objects.    Time  12    Period  Weeks    Status  Achieved      OT LONG TERM GOAL  #11   TITLE  Pt. will improve [facility] skills to be able to independently manipulate items for ADLs/IADLs when UEs are elevated when reaching into the closet, or microwave.    Baseline  05/19/2018: Pt. continues to have difficulty manipulating objects when UEs elevated  Time  12    Period  Weeks    Status  Achieved            Plan - 07/04/18 1610    Clinical Impression Statement  Patient has continued to make good progress with focus on smaller isolated finger movements of the left hand to work towards improved manipulation skills and handwriting with greater secure grip and control.  Patient able to demonstrate understanding of recommended tripod grasp and movement patterns and is able to start to recognize when he needs to adjust grasp on tool or pen.  Continue to work towards goals to increase functional hand use for necessary daily tasks.     Occupational performance deficits (Please refer to evaluation for details):  ADL's;IADL's    Rehab Potential  Excellent    OT Frequency  2x / week    OT Duration  12 weeks    OT Treatment/Interventions  Self-care/ADL training;Therapeutic exercise;Neuromuscular education;Patient/family education;Energy conservation;Therapeutic activities;DME and/or AE instruction    Consulted and Agree with Plan of Care  Patient       Patient will benefit from skilled therapeutic intervention in order to improve the following deficits and impairments:  Abnormal gait, Pain, Decreased strength, Decreased range of motion, Impaired UE functional use, Decreased coordination, Decreased knowledge of use of DME, Decreased activity tolerance, Difficulty walking, Decreased balance, Other (comment), Decreased knowledge of precautions  Visit Diagnosis: Muscle weakness (generalized)  Other lack of coordination    Problem List Patient Active Problem List    Diagnosis Date Noted  . Pure hypercholesterolemia 05/28/2017  . Erectile dysfunction 05/28/2017  . Left hemiparesis (HCC)   . Essential hypertension 11/29/2016  . 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 Chronic hepatitis C without hepatic coma (HCC) 07/22/2013    T , OTR/L, CLT  , 07/04/2018, 9:30 AM  [facility] MAIN [facility] SERVICES [address] [place], [place], [address] Phone: [phone]   Fax:  [phone]  Name: Odis Turck MRN: [id number] Date of Birth: 06/01/1945

## 2018-07-01 ENCOUNTER — Ambulatory Visit
Admission: RE | Admit: 2018-07-01 | Discharge: 2018-07-01 | Disposition: A | Discharge status: Home / Self Care | Payer: Medicare Other | Attending: Gastroenterology | Admitting: Gastroenterology

## 2018-07-01 ENCOUNTER — Encounter
Admission: RE | Disposition: A | Discharge status: Home / Self Care | Payer: Self-pay | Source: Home / Self Care | Attending: Gastroenterology

## 2018-07-01 ENCOUNTER — Ambulatory Visit: Payer: Medicare Other | Admitting: Certified Registered"

## 2018-07-01 ENCOUNTER — Encounter: Payer: Self-pay | Admitting: *Deleted

## 2018-07-01 ENCOUNTER — Other Ambulatory Visit: Payer: Self-pay | Admitting: Internal Medicine

## 2018-07-01 DIAGNOSIS — Z79899 Other long term (current) drug therapy: Secondary | ICD-10-CM | POA: Insufficient documentation

## 2018-07-01 DIAGNOSIS — Z1211 Encounter for screening for malignant neoplasm of colon: Secondary | ICD-10-CM

## 2018-07-01 DIAGNOSIS — Z8619 Personal history of other infectious and parasitic diseases: Secondary | ICD-10-CM | POA: Diagnosis not present

## 2018-07-01 DIAGNOSIS — Z87891 Personal history of nicotine dependence: Secondary | ICD-10-CM | POA: Insufficient documentation

## 2018-07-01 DIAGNOSIS — I10 Essential (primary) hypertension: Secondary | ICD-10-CM | POA: Diagnosis not present

## 2018-07-01 DIAGNOSIS — E7800 Pure hypercholesterolemia, unspecified: Secondary | ICD-10-CM | POA: Diagnosis not present

## 2018-07-01 DIAGNOSIS — Z7982 Long term (current) use of aspirin: Secondary | ICD-10-CM | POA: Insufficient documentation

## 2018-07-01 DIAGNOSIS — I639 Cerebral infarction, unspecified: Secondary | ICD-10-CM

## 2018-07-01 DIAGNOSIS — Z8673 Personal history of transient ischemic attack (TIA), and cerebral infarction without residual deficits: Secondary | ICD-10-CM | POA: Diagnosis not present

## 2018-07-01 DIAGNOSIS — Z8249 Family history of ischemic heart disease and other diseases of the circulatory system: Secondary | ICD-10-CM | POA: Diagnosis not present

## 2018-07-01 DIAGNOSIS — H409 Unspecified glaucoma: Secondary | ICD-10-CM | POA: Insufficient documentation

## 2018-07-01 HISTORY — PX: COLONOSCOPY WITH PROPOFOL: SHX5780

## 2018-07-01 SURGERY — COLONOSCOPY WITH PROPOFOL
Anesthesia: General

## 2018-07-01 MED ORDER — SODIUM CHLORIDE 0.9 % IV SOLN
INTRAVENOUS | Status: DC
  Administered 2018-07-01: 13:00:00 via INTRAVENOUS

## 2018-07-01 MED ORDER — GLYCOPYRROLATE 0.2 MG/ML IJ SOLN
INTRAMUSCULAR | Status: DC | PRN
  Administered 2018-07-01: 0.2 mg via INTRAVENOUS

## 2018-07-01 MED ORDER — PROPOFOL 10 MG/ML IV BOLUS
INTRAVENOUS | Status: DC | PRN
  Administered 2018-07-01: 50 mg via INTRAVENOUS
  Administered 2018-07-01: 20 mg via INTRAVENOUS
  Administered 2018-07-01: 50 mg via INTRAVENOUS
  Administered 2018-07-01 (×2): 20 mg via INTRAVENOUS
  Administered 2018-07-01: 50 mg via INTRAVENOUS
  Administered 2018-07-01: 20 mg via INTRAVENOUS

## 2018-07-01 MED ORDER — SODIUM CHLORIDE 0.9 % IV SOLN
INTRAVENOUS | Status: DC | PRN
  Administered 2018-07-01: 100 ug via INTRAVENOUS

## 2018-07-01 NOTE — Anesthesia Postprocedure Evaluation (Signed)
Anesthesia Post Note  Patient: Jason Nolan  Procedure(s) Performed: COLONOSCOPY WITH PROPOFOL (N/A )  Patient location during evaluation: Endoscopy Anesthesia Type: General Level of consciousness: awake and alert Pain management: pain level controlled Vital Signs Assessment: post-procedure vital signs reviewed and stable Respiratory status: spontaneous breathing, nonlabored ventilation, respiratory function stable and patient connected to nasal cannula oxygen Cardiovascular status: blood pressure returned to baseline and stable Postop Assessment: no apparent nausea or vomiting Anesthetic complications: no     Last Vitals:  Vitals:   07/01/18 1343 07/01/18 1353  BP: 107/88 136/77  Pulse: (!) 129 (!) 52  Resp: 14 13  Temp:    SpO2: 100% 100%    Last Pain:  Vitals:   07/01/18 1353  TempSrc:   PainSc: 0-No pain                 Cleda Mccreedy Louise Rawson

## 2018-07-01 NOTE — Op Note (Signed)
[facility] Gastroenterology Patient Name: Jason Nolan Procedure Date: 07/01/2018 12:57 PM MRN: [id number] Account #: [id number] Date of Birth: 07/12/1945 Admit Type: Outpatient Age: 73 Room: [facility] ENDO ROOM 4 Gender: Male Note Status: Finalized Procedure:            Colonoscopy Indications:          Screening for colorectal malignant neoplasm Providers:            Wyline Mood MD, MD Referring MD:         [name] (Referring MD) Medicines:            Monitored Anesthesia Care Complications:        No immediate complications. Procedure:            Pre-Anesthesia Assessment:                       - Prior to the procedure, a History and Physical was                        performed, and patient medications, allergies and                        sensitivities were reviewed. The patient's tolerance of                        previous anesthesia was reviewed.                       - The risks and benefits of the procedure and the                        sedation options and risks were discussed with the                        patient. All questions were answered and informed                        consent was obtained.                       - ASA Grade Assessment: II - A patient with mild                        systemic disease.                       After obtaining informed consent, the colonoscope was                        passed under direct vision. Throughout the procedure,                        the patient's blood pressure, pulse, and oxygen                        saturations were monitored continuously. The                        Colonoscope was introduced through the anus and  advanced to the the cecum, identified by the                        appendiceal orifice, IC valve and transillumination.                        The colonoscopy was performed with ease. The patient                        tolerated the procedure well. The quality  of the bowel                        preparation was good. Findings:      The perianal and digital rectal examinations were normal.      The entire examined colon appeared normal on direct and retroflexion       views. Impression:           - The entire examined colon is normal on direct and                        retroflexion views.                       - No specimens collected. Recommendation:       - Discharge patient to home (with escort).                       - Resume previous diet.                       - Continue present medications.                       - Repeat colonoscopy in 10 years for screening purposes. Procedure Code(s):    --- Professional ---                       [phone], Colonoscopy, flexible; diagnostic, including                        collection of specimen(s) by brushing or washing, when                        performed (separate procedure) Diagnosis Code(s):    --- Professional ---                       Z12.11, Encounter for screening for malignant neoplasm                        of colon CPT copyright 2018 American Medical Association. All rights reserved. The codes documented in this report are preliminary and upon coder review may  be revised to meet current compliance requirements. Wyline Mood, MD Wyline Mood MD, MD 07/01/2018 1:18:39 PM This report has been signed electronically. Number of Addenda: 0 Note Initiated On: 07/01/2018 12:57 PM Scope Withdrawal Time: 0 hours 10 minutes 58 seconds  Total Procedure Duration: 0 hours 14 minutes 49 seconds       [facility]

## 2018-07-01 NOTE — Anesthesia Post-op Follow-up Note (Signed)
Anesthesia QCDR form completed.        

## 2018-07-01 NOTE — Anesthesia Preprocedure Evaluation (Signed)
Anesthesia Evaluation  Patient identified by MRN, date of birth, ID band Patient awake    Reviewed: Allergy & Precautions, H&P , NPO status , Patient's Chart, lab work & pertinent test results  History of Anesthesia Complications Negative for: history of anesthetic complications  Airway Mallampati: III  TM Distance: >3 FB Neck ROM: limited    Dental  (+) Chipped, Poor Dentition   Pulmonary neg shortness of breath, former smoker,           Cardiovascular Exercise Tolerance: Good hypertension, (-) angina(-) Past MI and (-) DOE      Neuro/Psych CVA negative psych ROS   GI/Hepatic negative GI ROS, (+) Hepatitis -, C  Endo/Other  negative endocrine ROS  Renal/GU negative Renal ROS  negative genitourinary   Musculoskeletal   Abdominal   Peds  Hematology negative hematology ROS (+)   Anesthesia Other Findings Past Medical History: No date: Glaucoma No date: Hepatitis C No date: Stroke [facility])  Past Surgical History: 11/25/2016: IR ANGIO INTRA EXTRACRAN SEL COM CAROTID INNOMINATE UNI L  MOD SED 11/25/2016: IR ANGIO VERTEBRAL SEL SUBCLAVIAN INNOMINATE UNI R MOD SED 11/25/2016: IR ANGIO VERTEBRAL SEL VERTEBRAL UNI L MOD SED 11/25/2016: IR INTRAVSC STENT CERV CAROTID W/O EMB-PROT MOD SED INC  ANGIO 11/25/2016: IR PERCUTANEOUS ART THROMBECTOMY/INFUSION INTRACRANIAL INC  DIAG ANGIO 02/13/2017: IR RADIOLOGIST EVAL & MGMT 11/25/2016: RADIOLOGY WITH ANESTHESIA; N/A     Comment:  Procedure: RADIOLOGY WITH ANESTHESIA;  Surgeon:               Radiologist, Medication, MD;  Location: [facility] OR;  Service:               Radiology;  Laterality: N/A;  BMI    Body Mass Index:  27.55 kg/m      Reproductive/Obstetrics negative OB ROS                             Anesthesia Physical Anesthesia Plan  ASA: III  Anesthesia Plan: General   Post-op Pain Management:    Induction: Intravenous  PONV Risk Score and  Plan: Propofol infusion and TIVA  Airway Management Planned: Natural Airway and Nasal Cannula  Additional Equipment:   Intra-op Plan:   Post-operative Plan:   Informed Consent: I have reviewed the patients History and Physical, chart, labs and discussed the procedure including the risks, benefits and alternatives for the proposed anesthesia with the patient or authorized representative who has indicated his/her understanding and acceptance.     Dental Advisory Given  Plan Discussed with: Anesthesiologist, CRNA and Surgeon  Anesthesia Plan Comments: (Patient consented for risks of anesthesia including but not limited to:  - adverse reactions to medications - risk of intubation if required - damage to teeth, lips or other oral mucosa - sore throat or hoarseness - Damage to heart, brain, lungs or loss of life  Patient voiced understanding.)        Anesthesia Quick Evaluation

## 2018-07-01 NOTE — H&P (Signed)
Wyline Mood, MD [address], [address], [place], [place], [address] [address], [address], [place], [place], [address] Phone: [phone]  Fax: [phone]  Primary Care Physician:  [name], NP   Pre-Procedure History & Physical: HPI:  Jason Nolan is a 73 y.o. male is here for an colonoscopy.   Past Medical History:  Diagnosis Date  . Glaucoma   . Hepatitis C   . Stroke [facility])     Past Surgical History:  Procedure Laterality Date  . IR ANGIO INTRA EXTRACRAN SEL COM CAROTID INNOMINATE UNI L MOD SED  11/25/2016  . IR ANGIO VERTEBRAL SEL SUBCLAVIAN INNOMINATE UNI R MOD SED  11/25/2016  . IR ANGIO VERTEBRAL SEL VERTEBRAL UNI L MOD SED  11/25/2016  . IR INTRAVSC STENT CERV CAROTID W/O EMB-PROT MOD SED INC ANGIO  11/25/2016  . IR PERCUTANEOUS ART THROMBECTOMY/INFUSION INTRACRANIAL INC DIAG ANGIO  11/25/2016  . IR RADIOLOGIST EVAL & MGMT  02/13/2017  . RADIOLOGY WITH ANESTHESIA N/A 11/25/2016   Procedure: RADIOLOGY WITH ANESTHESIA;  Surgeon: Radiologist, Medication, MD;  Location: [facility] OR;  Service: Radiology;  Laterality: N/A;    Prior to Admission medications   Medication Sig Start Date End Date Taking? Authorizing Provider  amLODipine (NORVASC) 10 MG tablet TAKE 1 TABLET BY MOUTH EVERY DAY 05/08/18  Yes [name], NP  atorvastatin (LIPITOR) 40 MG tablet TAKE 1 TABLET DAILY AT 6 P.M. 09/02/17  Yes Baity, Salvadore Oxford, NP  dorzolamide-timolol (COSOPT) 22.3-6.8 MG/ML ophthalmic solution Place 1 drop into both eyes.  05/23/17  Yes [provider]  aspirin 81 MG chewable tablet Chew 1 tablet (81 mg total) by mouth daily. 12/21/16   Love, Evlyn Kanner, PA-C  latanoprost (XALATAN) 0.005 % ophthalmic solution INSTILL 1 DROP INTO BOTH EYES EVERY DAY IN THE EVENING 03/17/18   [provider]  tadalafil (CIALIS) 20 MG tablet TAKE 0.5-1 TABLETS (10-20 MG TOTAL) BY MOUTH EVERY OTHER DAY AS NEEDED FOR ERECTILE DYSFUNCTION. 09/06/17   [provider]  topiramate  (TOPAMAX) 25 MG tablet TAKE 1 TABLET BY MOUTH TWICE A DAY 06/22/18   Kirsteins, Victorino Sparrow, MD  VYZULTA 0.024 % SOLN Apply 1 drop to eye at bedtime.  11/12/16   [provider]    Allergies as of 03/31/2018  . (No Known Allergies)    Family History  Problem Relation Age of Onset  . Hypertension Mother   . Hypertension Father     Social History   Socioeconomic History  . Marital status: Married    Spouse name: Not on file  . Number of children: Not on file  . Years of education: Not on file  . Highest education level: Not on file  Occupational History  . Not on file  Social Needs  . Financial resource strain: Not on file  . Food insecurity:    Worry: Not on file    Inability: Not on file  . Transportation needs:    Medical: Not on file    Non-medical: Not on file  Tobacco Use  . Smoking status: Former Smoker    Packs/day: 0.50    Years: 15.00    Pack years: 7.50    Last attempt to quit: 1990    Years since quitting: 30.1  . Smokeless tobacco: Never Used  Substance and Sexual Activity  . Alcohol use: No    Alcohol/week: 0.0 standard drinks  . Drug use: No  . Sexual activity: Yes    Partners:  Female    Birth control/protection: None  Lifestyle  . Physical activity:    Days per week: Not on file    Minutes per session: Not on file  . Stress: Not on file  Relationships  . Social connections:    Talks on phone: Not on file    Gets together: Not on file    Attends religious service: Not on file    Active member of club or organization: Not on file    Attends meetings of clubs or organizations: Not on file    Relationship status: Not on file  . Intimate partner violence:    Fear of current or ex partner: Not on file    Emotionally abused: Not on file    Physically abused: Not on file    Forced sexual activity: Not on file  Other Topics Concern  . Not on file  Social History Narrative  . Not on file    Review of Systems: See HPI, otherwise negative  ROS  Physical Exam: BP (!) 142/82   Pulse (!) 50   Temp (!) 96.5 F (35.8 C) (Tympanic)   Resp 16   Ht 5\' 10"  (1.778 m)   Wt 87.1 kg   SpO2 100%   BMI 27.55 kg/m  General:   Alert,  pleasant and cooperative in NAD Head:  Normocephalic and atraumatic. Neck:  Supple; no masses or thyromegaly. Lungs:  Clear throughout to auscultation, normal respiratory effort.    Heart:  +S1, +S2, Regular rate and rhythm, No edema. Abdomen:  Soft, nontender and nondistended. Normal bowel sounds, without guarding, and without rebound.   Neurologic:  Alert and  oriented x4;  grossly normal neurologically.  Impression/Plan: Jason Nolan is here for an colonoscopy to be performed for Screening colonoscopy average risk   Risks, benefits, limitations, and alternatives regarding  colonoscopy have been reviewed with the patient.  Questions have been answered.  All parties agreeable.   Wyline Mood, MD  07/01/2018, 12:41 PM

## 2018-07-01 NOTE — Transfer of Care (Signed)
Immediate Anesthesia Transfer of Care Note  Patient: Jason Nolan  Procedure(s) Performed: COLONOSCOPY WITH PROPOFOL (N/A )  Patient Location: Endoscopy Unit  Anesthesia Type:General  Level of Consciousness: drowsy and responds to stimulation  Airway & Oxygen Therapy: Patient Spontanous Breathing and Patient connected to nasal cannula oxygen  Post-op Assessment: Report given to RN and Post -op Vital signs reviewed and stable  Post vital signs: Reviewed and stable  Last Vitals:  Vitals Value Taken Time  BP 103/74 07/01/2018  1:23 PM  Temp    Pulse 76 07/01/2018  1:23 PM  Resp 17 07/01/2018  1:23 PM  SpO2 99 % 07/01/2018  1:23 PM    Last Pain:  Vitals:   07/01/18 1216  TempSrc: Tympanic         Complications: No apparent anesthesia complications

## 2018-07-02 ENCOUNTER — Ambulatory Visit: Payer: Medicare Other | Admitting: Occupational Therapy

## 2018-07-02 ENCOUNTER — Encounter: Payer: Self-pay | Admitting: Gastroenterology

## 2018-07-02 DIAGNOSIS — R278 Other lack of coordination: Secondary | ICD-10-CM | POA: Diagnosis not present

## 2018-07-02 DIAGNOSIS — M6281 Muscle weakness (generalized): Secondary | ICD-10-CM

## 2018-07-03 ENCOUNTER — Ambulatory Visit (INDEPENDENT_AMBULATORY_CARE_PROVIDER_SITE_OTHER): Payer: Medicare Other | Admitting: Internal Medicine

## 2018-07-03 ENCOUNTER — Encounter: Payer: Self-pay | Admitting: Internal Medicine

## 2018-07-03 VITALS — BP 130/80 | HR 56 | Temp 98.0°F | Ht 70.0 in | Wt 198.0 lb

## 2018-07-03 DIAGNOSIS — Z125 Encounter for screening for malignant neoplasm of prostate: Secondary | ICD-10-CM | POA: Diagnosis not present

## 2018-07-03 DIAGNOSIS — I639 Cerebral infarction, unspecified: Secondary | ICD-10-CM | POA: Diagnosis not present

## 2018-07-03 DIAGNOSIS — Z0000 Encounter for general adult medical examination without abnormal findings: Secondary | ICD-10-CM

## 2018-07-03 DIAGNOSIS — B182 Chronic viral hepatitis C: Secondary | ICD-10-CM

## 2018-07-03 DIAGNOSIS — N529 Male erectile dysfunction, unspecified: Secondary | ICD-10-CM | POA: Diagnosis not present

## 2018-07-03 DIAGNOSIS — I10 Essential (primary) hypertension: Secondary | ICD-10-CM | POA: Diagnosis not present

## 2018-07-03 DIAGNOSIS — G8194 Hemiplegia, unspecified affecting left nondominant side: Secondary | ICD-10-CM

## 2018-07-03 DIAGNOSIS — E7800 Pure hypercholesterolemia, unspecified: Secondary | ICD-10-CM | POA: Diagnosis not present

## 2018-07-03 LAB — COMPREHENSIVE METABOLIC PANEL
ALBUMIN: 4.6 g/dL (ref 3.5–5.2)
ALT: 31 U/L (ref 0–53)
AST: 23 U/L (ref 0–37)
Alkaline Phosphatase: 70 U/L (ref 39–117)
BILIRUBIN TOTAL: 0.7 mg/dL (ref 0.2–1.2)
BUN: 13 mg/dL (ref 6–23)
CO2: 28 mEq/L (ref 19–32)
Calcium: 9.1 mg/dL (ref 8.4–10.5)
Chloride: 104 mEq/L (ref 96–112)
Creatinine, Ser: 0.96 mg/dL (ref 0.40–1.50)
GFR: 93.59 mL/min (ref >60.00)
Glucose, Bld: 61 mg/dL — ABNORMAL LOW (ref 70–99)
Potassium: 4.4 mEq/L (ref 3.5–5.1)
Sodium: 139 mEq/L (ref 135–145)
Total Protein: 7.2 g/dL (ref 6.0–8.3)

## 2018-07-03 LAB — CBC
HCT: 45.7 % (ref 39.0–52.0)
Hemoglobin: 15 g/dL (ref 13.0–17.0)
MCHC: 32.8 g/dL (ref 30.0–36.0)
MCV: 93.2 fl (ref 78.0–100.0)
Platelets: 193 10*3/uL (ref 150.0–400.0)
RBC: 4.9 Mil/uL (ref 4.22–5.81)
RDW: 14 % (ref 11.5–15.5)
WBC: 2.7 10*3/uL — ABNORMAL LOW (ref 4.0–10.5)

## 2018-07-03 LAB — LIPID PANEL
Cholesterol: 129 mg/dL (ref 0–200)
HDL: 59.1 mg/dL (ref >39.00)
LDL Cholesterol: 59 mg/dL (ref 0–99)
NonHDL: 70.38
Total CHOL/HDL Ratio: 2
Triglycerides: 59 mg/dL (ref 0.0–149.0)
VLDL: 11.8 mg/dL (ref 0.0–40.0)

## 2018-07-03 LAB — PSA, MEDICARE: PSA: 0.19 ng/mL (ref 0.10–4.00)

## 2018-07-03 MED ORDER — AMLODIPINE BESYLATE 10 MG PO TABS: 10.0000 mg | ORAL_TABLET | Freq: Every day | ORAL | 3 refills | Status: DC

## 2018-07-03 MED ORDER — ATORVASTATIN CALCIUM 40 MG PO TABS: ORAL_TABLET | ORAL | 3 refills | Status: DC

## 2018-07-03 NOTE — Patient Instructions (Signed)
Health Maintenance After Age 71 After age 71, you are at a higher risk for certain long-term diseases and infections as well as injuries from falls. Falls are a major cause of broken bones and head injuries in people who are older than age 71. Getting regular preventive care can help to keep you healthy and well. Preventive care includes getting regular testing and making lifestyle changes as recommended by your health care provider. Talk with your health care provider about:  Which screenings and tests you should have. A screening is a test that checks for a disease when you have no symptoms.  A diet and exercise plan that is right for you. What should I know about screenings and tests to prevent falls? Screening and testing are the best ways to find a health problem early. Early diagnosis and treatment give you the best chance of managing medical conditions that are common after age 71. Certain conditions and lifestyle choices may make you more likely to have a fall. Your health care provider may recommend:  Regular vision checks. Poor vision and conditions such as cataracts can make you more likely to have a fall. If you wear glasses, make sure to get your prescription updated if your vision changes.  Medicine review. Work with your health care provider to regularly review all of the medicines you are taking, including over-the-counter medicines. Ask your health care provider about any side effects that may make you more likely to have a fall. Tell your health care provider if any medicines that you take make you feel dizzy or sleepy.  Osteoporosis screening. Osteoporosis is a condition that causes the bones to get weaker. This can make the bones weak and cause them to break more easily.  Blood pressure screening. Blood pressure changes and medicines to control blood pressure can make you feel dizzy.  Strength and balance checks. Your health care provider may recommend certain tests to check your  strength and balance while standing, walking, or changing positions.  Foot health exam. Foot pain and numbness, as well as not wearing proper footwear, can make you more likely to have a fall.  Depression screening. You may be more likely to have a fall if you have a fear of falling, feel emotionally low, or feel unable to do activities that you used to do.  Alcohol use screening. Using too much alcohol can affect your balance and may make you more likely to have a fall. What actions can I take to lower my risk of falls? General instructions  Talk with your health care provider about your risks for falling. Tell your health care provider if: ? You fall. Be sure to tell your health care provider about all falls, even ones that seem minor. ? You feel dizzy, sleepy, or off-balance.  Take over-the-counter and prescription medicines only as told by your health care provider. These include any supplements.  Eat a healthy diet and maintain a healthy weight. A healthy diet includes low-fat dairy products, low-fat (lean) meats, and fiber from whole grains, beans, and lots of fruits and vegetables. Home safety  Remove any tripping hazards, such as rugs, cords, and clutter.  Install safety equipment such as grab bars in bathrooms and safety rails on stairs.  Keep rooms and walkways well-lit. Activity   Follow a regular exercise program to stay fit. This will help you maintain your balance. Ask your health care provider what types of exercise are appropriate for you.  If you need a cane or   walker, use it as recommended by your health care provider.  Wear supportive shoes that have nonskid soles. Lifestyle  Do not drink alcohol if your health care provider tells you not to drink.  If you drink alcohol, limit how much you have: ? 0-1 drink a day for women. ? 0-2 drinks a day for men.  Be aware of how much alcohol is in your drink. In the [place] one drink equals one typical bottle of beer (12  oz), one-half glass of wine (5 oz), or one shot of hard liquor (1 oz).  Do not use any products that contain nicotine or tobacco, such as cigarettes and e-cigarettes. If you need help quitting, ask your health care provider. Summary  Having a healthy lifestyle and getting preventive care can help to protect your health and wellness after age 71.  Screening and testing are the best way to find a health problem early and help you avoid having a fall. Early diagnosis and treatment give you the best chance for managing medical conditions that are more common for people who are older than age 71.  Falls are a major cause of broken bones and head injuries in people who are older than age 71. Take precautions to prevent a fall at home.  Work with your health care provider to learn what changes you can make to improve your health and wellness and to prevent falls. This information is not intended to replace advice given to you by your health care provider. Make sure you discuss any questions you have with your health care provider. Document Released: 03/05/2017 Document Revised: 03/05/2017 Document Reviewed: 03/05/2017 Elsevier Interactive Patient Education  2019 [employer]  

## 2018-07-03 NOTE — Addendum Note (Signed)
Addended by: [name] on: 07/03/2018 07:16 PM   Modules accepted: Orders

## 2018-07-03 NOTE — Assessment & Plan Note (Signed)
CMET today Continue to follow with GI

## 2018-07-03 NOTE — Assessment & Plan Note (Signed)
  Continue Amlodipine, ASA and Atorvastatin Continue rehab Continue to follow with neurology

## 2018-07-03 NOTE — Assessment & Plan Note (Signed)
CMET and Lipid profile today Encouraged him to consume a low fat diet Continue Atorvastatin for now, will refill if adjustment not needed based on labs

## 2018-07-03 NOTE — Assessment & Plan Note (Signed)
Continue Cialis prn 

## 2018-07-03 NOTE — Assessment & Plan Note (Signed)
With left hemiparesis Continue Amlodipine, ASA and Atorvastatin Continue rehab Continue to follow with neurology

## 2018-07-03 NOTE — Progress Notes (Signed)
HPI:  Pt presents to the clinic today for his Medicare Wellness Exam. He is also due to follow up chronic conditions.  HTN: His BP today is 130/80. He is taking Amlodipine as prescribed. ECG from 11/2016 reviewed.  History of Stroke: With residual left sided weakness. He continues with rehab at this time. He is taking Amlodipine, ASA and Atorvastatin as prescribed. He follows with Dr. Pearlean Brownie.  History of Hep C and Hep B: In remission. He follows with Dr. Tobi Bastos.  ED: He takes Cialis as needed with good results.  Past Medical History:  Diagnosis Date  . Glaucoma   . Hepatitis C   . Stroke [facility])     Current Outpatient Medications  Medication Sig Dispense Refill  . amLODipine (NORVASC) 10 MG tablet TAKE 1 TABLET BY MOUTH EVERY DAY 90 tablet 0  . aspirin 81 MG chewable tablet Chew 1 tablet (81 mg total) by mouth daily.    Marland Kitchen atorvastatin (LIPITOR) 40 MG tablet TAKE 1 TABLET (40 MG TOTAL) BY MOUTH DAILY AT 6 PM. 90 tablet 0  . dorzolamide-timolol (COSOPT) 22.3-6.8 MG/ML ophthalmic solution Place 1 drop into both eyes.     . tadalafil (CIALIS) 20 MG tablet TAKE 0.5-1 TABLETS (10-20 MG TOTAL) BY MOUTH EVERY OTHER DAY AS NEEDED FOR ERECTILE DYSFUNCTION.  2  . VYZULTA 0.024 % SOLN Apply 1 drop to eye at bedtime.      No current facility-administered medications for this visit.     No Known Allergies  Family History  Problem Relation Age of Onset  . Hypertension Mother   . Hypertension Father     Social History   Socioeconomic History  . Marital status: Married    Spouse name: Not on file  . Number of children: Not on file  . Years of education: Not on file  . Highest education level: Not on file  Occupational History  . Not on file  Social Needs  . Financial resource strain: Not on file  . Food insecurity:    Worry: Not on file    Inability: Not on file  . Transportation needs:    Medical: Not on file    Non-medical: Not on file  Tobacco Use  . Smoking status: Former  Smoker    Packs/day: 0.50    Years: 15.00    Pack years: 7.50    Last attempt to quit: 1990    Years since quitting: 30.1  . Smokeless tobacco: Never Used  Substance and Sexual Activity  . Alcohol use: No    Alcohol/week: 0.0 standard drinks  . Drug use: No  . Sexual activity: Yes    Partners: Female    Birth control/protection: None  Lifestyle  . Physical activity:    Days per week: Not on file    Minutes per session: Not on file  . Stress: Not on file  Relationships  . Social connections:    Talks on phone: Not on file    Gets together: Not on file    Attends religious service: Not on file    Active member of club or organization: Not on file    Attends meetings of clubs or organizations: Not on file    Relationship status: Not on file  . Intimate partner violence:    Fear of current or ex partner: Not on file    Emotionally abused: Not on file    Physically abused: Not on file    Forced sexual activity: Not on file  Other  Topics Concern  . Not on file  Social History Narrative  . Not on file    Hospitiliaztions: None  Health Maintenance:    Flu: 01/2018  Tetanus: 05/2012  Pneumovax: 10/2014  Prevnar: 07/2013  Zostavax: 02/2014  Shingrix: 2018  PSA: 05/2017  Colon Screening: 06/2018  Eye Doctor: annually  Dental Exam: biannually   Providers:   PCP: Nicki Reaper, NP-C  Physical Med: Kirsteins  Gastroenterologist: Dr. Tobi Bastos  Neurology: Dr. Pearlean Brownie   I have personally reviewed and have noted:  1. The patient's medical and social history 2. Their use of alcohol, tobacco or illicit drugs 3. Their current medications and supplements 4. The patient's functional ability including ADL's, fall risks, home safety risks and hearing or visual impairment. 5. Diet and physical activities 6. Evidence for depression or mood disorder  Subjective:   Review of Systems:   Constitutional: Denies fever, malaise, fatigue, headache or abrupt weight changes.  HEENT: Denies  eye pain, eye redness, ear pain, ringing in the ears, wax buildup, runny nose, nasal congestion, bloody nose, or sore throat. Respiratory: Denies difficulty breathing, shortness of breath, cough or sputum production.   Cardiovascular: Denies chest pain, chest tightness, palpitations or swelling in the hands or feet.  Gastrointestinal: Denies abdominal pain, bloating, constipation, diarrhea or blood in the stool.  GU: Pt reports erectile dysfunction. Denies urgency, frequency, pain with urination, burning sensation, blood in urine, odor or discharge. Musculoskeletal: Pt reports left hand swelling and LUE/LLE weakness. Denies decrease in range of motion, difficulty with gait, muscle pain or joint pain.  Skin: Denies redness, rashes, lesions or ulcercations.  Neurological: Pt reports difficulty with speech. Denies dizziness, difficulty with memory, or problems with balance and coordination.  Psych: Denies anxiety, depression, SI/HI.  No other specific complaints in a complete review of systems (except as listed in HPI above).  Objective:  PE:   BP 130/80   Pulse (!) 56   Temp 98 F (36.7 C) (Oral)   Ht  (1.778 m)   Wt 198 lb (89.8 kg)   SpO2 97%   BMI 28.41 kg/m  Wt Readings from Last 3 Encounters:  07/03/18 198 lb (89.8 kg)  07/01/18 192 lb (87.1 kg)  05/20/18 198 lb 6.4 oz (90 kg)    General: Appears his stated age, well developed, well nourished in NAD. Skin: Warm, dry and intact.  HEENT: Head: normal shape and size; Eyes: sclera white, no icterus, conjunctiva pink, PERRLA and EOMs intact; Ears: bilateral cerumen impaction; Throat/Mouth: Teeth present, mucosa pink and moist, no exudate, lesions or ulcerations noted.  Neck: Neck supple, trachea midline. No masses, lumps or thyromegaly present.  Cardiovascular: Normal rate and rhythm. S1,S2 noted.  No murmur, rubs or gallops noted. No JVD or BLE edema. No carotid bruits noted. Pulmonary/Chest: Normal effort and positive  vesicular breath sounds. No respiratory distress. No wheezes, rales or ronchi noted.  Abdomen: Soft and nontender. Normal bowel sounds. No distention or masses noted. Liver, spleen and kidneys non palpable. Musculoskeletal: Normal range of motion. Strength 5/5 RUE/RLE. Strength 4/5 LUE/LLE. 1+ swelling of left hand. No signs of joint swelling.  Neurological: Alert and oriented. Cranial nerves II-XII grossly intact. Coordination normal.  Psychiatric: Mood and affect normal. Behavior is normal. Judgment and thought content normal.    BMET    Component Value Date/Time   NA 140 05/28/2017 1506   K 4.0 05/28/2017 1506   CL 104 05/28/2017 1506   CO2 25 05/28/2017 1506   GLUCOSE 81  05/28/2017 1506   BUN 14 05/28/2017 1506   CREATININE 0.91 05/28/2017 1506   CALCIUM 9.4 05/28/2017 1506   GFRNONAA >60 12/19/2016 0406   GFRAA >60 12/19/2016 0406    Lipid Panel     Component Value Date/Time   CHOL 125 05/28/2017 1506   TRIG 41.0 05/28/2017 1506   HDL 68.00 05/28/2017 1506   CHOLHDL 2 05/28/2017 1506   VLDL 8.2 05/28/2017 1506   LDLCALC 49 05/28/2017 1506    CBC    Component Value Date/Time   WBC 3.0 (L) 06/20/2017 1605   RBC 4.84 06/20/2017 1605   HGB 14.8 06/20/2017 1605   HCT 44.6 06/20/2017 1605   PLT 202 06/20/2017 1605   MCV 92.1 06/20/2017 1605   MCH 30.7 06/20/2017 1605   MCHC 33.3 06/20/2017 1605   RDW 14.3 06/20/2017 1605   LYMPHSABS 1.1 06/20/2017 1605   MONOABS 0.2 06/20/2017 1605   EOSABS 0.1 06/20/2017 1605   BASOSABS 0.0 06/20/2017 1605    Hgb A1C Lab Results  Component Value Date   HGBA1C 5.4 11/26/2016      Assessment and Plan:   Medicare Annual Wellness Visit:  Diet: He does eat meat. He consumes fruits and veggies daily. He tries to avoid fried foods. He drinks mostly coffee, water. Physical activity: Sedentary Depression/mood screen: Negative Hearing: Intact to whispered voice Visual acuity: Grossly normal, performs annual eye exam  ADLs:  Capable Fall risk: Moderate, Hx of Stroke with Left Hemiparesis Home safety: Good Cognitive evaluation: Intact to orientation, naming, recall and repetition EOL planning: No adv directives, full code/ I agree  Preventative Medicine: All vaccines are UTD. PSA today with labs. Colon screening UTD. Encouraged him to consume a balanced diet and exercise regimen. Advised him to see an eye doctor and dentist annually. Will check CBC, CMET, Lipid and PSA today.   Next appointment: 6 months, follow up chronic conditions   Nicki Reaper, NP

## 2018-07-03 NOTE — Assessment & Plan Note (Signed)
Controlled on Amlodipine Reinforced DASH diet and importance of aerobic exercise CBC and CMET today

## 2018-07-04 ENCOUNTER — Encounter: Payer: Self-pay | Admitting: Occupational Therapy

## 2018-07-04 NOTE — Therapy (Signed)
[facility]IN [facility] [address] [place], [place], [address] Phone: [phone]   Fax:  [phone]  Occupational Therapy Treatment/Progress Update Reporting period from 05/19/2018 to 07/02/2018  Patient Details  Name: Jason CroakJames Nolan MRN: [id number] Date of Birth: 11-Dec-1945 No data recorded  Encounter Date: 07/02/2018  OT End of Session - 07/04/18 0945    Visit Number  110    Number of Visits  120    Date for OT Re-Evaluation  08/11/18    Authorization Type  Progress reporting period starting 07/02/2018    OT Start Time  0930    OT Stop Time  1016    OT Time Calculation (min)  46 min    Activity Tolerance  Patient tolerated treatment well    Behavior During Therapy  [facility] for tasks assessed/performed       Past Medical History:  Diagnosis Date  . Glaucoma   . Hepatitis C   . Stroke [facility](HCC)     Past Surgical History:  Procedure Laterality Date  . COLONOSCOPY WITH PROPOFOL N/A 07/01/2018   Procedure: COLONOSCOPY WITH PROPOFOL;  Surgeon: Wyline MoodAnna, Kiran, MD;  [facility];  Service: Gastroenterology;  Laterality: N/A;  . IR ANGIO INTRA EXTRACRAN SEL COM CAROTID INNOMINATE UNI L MOD SED  11/25/2016  . IR ANGIO VERTEBRAL SEL SUBCLAVIAN INNOMINATE UNI R MOD SED  11/25/2016  . IR ANGIO VERTEBRAL SEL VERTEBRAL UNI L MOD SED  11/25/2016  . IR INTRAVSC STENT CERV CAROTID W/O EMB-PROT MOD SED INC ANGIO  11/25/2016  . IR PERCUTANEOUS ART THROMBECTOMY/INFUSION INTRACRANIAL INC DIAG ANGIO  11/25/2016  . IR RADIOLOGIST EVAL & MGMT  02/13/2017  . RADIOLOGY WITH ANESTHESIA N/A 11/25/2016   Procedure: RADIOLOGY WITH ANESTHESIA;  Surgeon: Radiologist, Medication, MD;  Location[facility] OR;  Service: Radiology;  Laterality: N/A;    There were no vitals filed for this visit.  Subjective Assessment - 07/04/18 0943    Subjective   Patient reports his colonscopy went well and he does not have to go back for another 10 years. He did not have time to  do any exercises yesterday with his procedure being done and recovery.     Pertinent History  Pt. is a 73 y.o. 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 [facility] He underwent CTA brain showed short segment near occlusion of proximal R-ICA with associated intraluminal thrombus --likely due to dissection and emergent large vessel occlusion with right M2 occlusion and evolving right frontal lobe infarct.  He underwent cerebral angio with  complete revascularization of occluded superior division of R-MCA and near complete revascularization of proximal R-ICA with stent assisted angioplasty.  Stroke felt to be embolic in setting of ruptured plaque R-ICA and he was placed on ASA and brillinta due to stent. Pt. was transferred to inpatient rehab for several weeks, had home health therapy upon discharge, and now is ready for outpatient therapy services.    Limitations  Dominant LUE functioning, left hand edema, distal ROM, coordination, and overal strength.    Patient Stated Goals  To regain the use of his LUE.    Currently in Pain?  No/denies    Pain Score  0-No pain       Therex:  Reassessment of hand skills with grip, pinch and coordination refer to chart below for details. Patient seen this date for lateral pinch skills to assist with improved grasp on utensil for self feeding.  Cues to pay  attention to grasp and to adjust when needed.   [facility] OT Assessment - 07/04/18 0001      Coordination   Left 9 Hole Peg Test  46 sec      Hand Function   Right Hand Grip (lbs)  75    Right Hand Lateral Pinch  28 lbs    Left Hand Grip (lbs)  50    Left Hand Lateral Pinch  17 lbs    Left 3 point pinch  10 lbs     ADL:  Reassessment of self care and IADL tasks-  Patient has improved with hand skills to now be able to demonstrate managing opening car door, bolt lock, small turn lock.  He is able to open steam kettle with good safety, able to place items into microwave and can  place and remove items in the closet.  He does have difficulty with maintaining a secure grasp on his fork when feeding self.  He has improved with handwriting but still demonstrates difficulty with maintaining tripod grasp and demo of isolated movements required for optimal control of writing utensil/tool.   Patient appears to benefit from a larger surface area on his fork to be able to demonstrate a more secure grasp on the utensil.  Will continue to work towards modifications for this task.   Neuromuscular Reeducation: Patient seen for manipulation of small objects from tabletop with cues for prehension and grasping patterns.  Focused on use of writing utensil to work on control with circling letters and words, patient to continue with task at home (handout issued to be performed by next session).            Patient continues to progress with functional hand skills with left dominant hand.  His handwriting is improved with focus on improving a more secure tripod grasp and improved control with tool use.  Goals update to reflect progress, significant progress noted with fine motor coordination with 9 hole peg test, improved by greater than 20 secs.  Patient continues to progress in all areas and continues to benefit from skilled OT services to maximize safety and independence in daily activities.         OT Education - 07/04/18 0945    Education provided  Yes    Education Details  updated goals, precision when working on circling letters/words    Person(s) Educated  Patient    Methods  Explanation;Demonstration;Verbal cues    Comprehension  Verbalized understanding;Returned demonstration          OT Long Term Goals - 07/04/18 0946      OT LONG TERM GOAL #3   Title  Pt. left hand grip with  increase by 5# in preparation for opening a jar.    Baseline  05/14/2018: Pt. is now able to open jars, and containers.    Time  12    Status  Achieved      OT LONG TERM GOAL #8   Title   Pt. will increase Left pinch strength to be able to grasp and hold items during ADL tasks.    Baseline  05/14/2018: Pt. is now able to grasp, and hold items for ADLs.    Time  12    Period  Weeks    Status  Achieved      OT LONG TERM GOAL  #9   Baseline  Pt. will write one sentence with 75% legibility and efficiently while maintaing grasp on pen 100% of the time.  Time  12    Period  Weeks    Status  On-going      OT LONG TERM GOAL  #10   TITLE  Pt. will independently use his left hand to grasp and efficiently accomodate to the varying weights of ADL objects during self care tasks.     Baseline  05/14/2018: Pt. continues to work on grasping and adjusting his hand to the varying weights of objects.    Time  12    Period  Weeks    Status  On-going      OT LONG TERM GOAL  #11   TITLE  Pt. will improve [facility] skills to be able to independently manipulate items for ADLs/IADLs when UEs are elevated when reaching into the closet, or microwave.    Baseline  05/14/2018: Pt. continues to have difficulty manipulating objects when UEs elevated     Time  12    Period  Weeks    Status  On-going            Plan - 07/04/18 0946    Clinical Impression Statement  Patient continues to progress with functional hand skills with left dominant hand.  His handwriting is improved with focus on improving a more secure tripod grasp and improved control with tool use.  Goals update to reflect progress, significant progress noted with fine motor coordination with 9 hole peg test, improved by greater than 20 secs.  Patient continues to progress in all areas and continues to benefit from skilled OT services to maximize safety and independence in daily activities.     Occupational performance deficits (Please refer to evaluation for details):  ADL's;IADL's    Rehab Potential  Excellent    OT Frequency  2x / week    OT Duration  12 weeks    OT Treatment/Interventions  Self-care/ADL training;Therapeutic  exercise;Neuromuscular education;Patient/family education;Energy conservation;Therapeutic activities;DME and/or AE instruction    Consulted and Agree with Plan of Care  Patient       Patient will benefit from skilled therapeutic intervention in order to improve the following deficits and impairments:  Abnormal gait, Pain, Decreased strength, Decreased range of motion, Impaired UE functional use, Decreased coordination, Decreased knowledge of use of DME, Decreased activity tolerance, Difficulty walking, Decreased balance, Other (comment), Decreased knowledge of precautions  Visit Diagnosis: Muscle weakness (generalized)  Other lack of coordination    Problem List Patient Active Problem List   Diagnosis Date Noted  . Pure hypercholesterolemia 05/28/2017  . Erectile dysfunction 05/28/2017  . Left hemiparesis (HCC)   . Essential hypertension 11/29/2016  . 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 Chronic hepatitis C without hepatic coma (HCC) 07/22/2013   Amy T Lovett, OTR/L, CLT  Lovett,Amy 07/04/2018, 10:05 AM  [facility] MAIN [facility] SERVICES [address] [place], [place], [address] Phone: [phone]   Fax:  [phone]  Name: Marcellus Musumeci MRN: [id number] Date of Birth: [birth year]

## 2018-07-07 ENCOUNTER — Ambulatory Visit: Payer: Medicare Other | Attending: Internal Medicine | Admitting: Occupational Therapy

## 2018-07-07 ENCOUNTER — Encounter: Payer: Self-pay | Admitting: Occupational Therapy

## 2018-07-07 DIAGNOSIS — M6281 Muscle weakness (generalized): Secondary | ICD-10-CM | POA: Diagnosis not present

## 2018-07-07 DIAGNOSIS — R278 Other lack of coordination: Secondary | ICD-10-CM | POA: Diagnosis not present

## 2018-07-07 NOTE — Therapy (Signed)
[facility] MAIN [facility] SERVICES [address] [place], [place], [address] Phone: [phone]   Fax:  [phone]  Occupational Therapy Treatment  Patient Details  Name: Jason Nolan MRN: [id number] Date of Birth: 1945/05/25 No data recorded  Encounter Date: 07/07/2018  OT End of Session - 07/07/18 0957    Visit Number  111    Number of Visits  120    Date for OT Re-Evaluation  08/11/18    Authorization Type  Progress reporting period starting 07/02/2018    OT Start Time  0930    OT Stop Time  1015    OT Time Calculation (min)  45 min    Activity Tolerance  Patient tolerated treatment well    Behavior During Therapy  [facility] for tasks assessed/performed       Past Medical History:  Diagnosis Date  . Glaucoma   . Hepatitis C   . Stroke [facility])     Past Surgical History:  Procedure Laterality Date  . COLONOSCOPY WITH PROPOFOL N/A 07/01/2018   Procedure: COLONOSCOPY WITH PROPOFOL;  Surgeon: Wyline Mood, MD;  Location: [facility] ENDOSCOPY;  Service: Gastroenterology;  Laterality: N/A;  . IR ANGIO INTRA EXTRACRAN SEL COM CAROTID INNOMINATE UNI L MOD SED  11/25/2016  . IR ANGIO VERTEBRAL SEL SUBCLAVIAN INNOMINATE UNI R MOD SED  11/25/2016  . IR ANGIO VERTEBRAL SEL VERTEBRAL UNI L MOD SED  11/25/2016  . IR INTRAVSC STENT CERV CAROTID W/O EMB-PROT MOD SED INC ANGIO  11/25/2016  . IR PERCUTANEOUS ART THROMBECTOMY/INFUSION INTRACRANIAL INC DIAG ANGIO  11/25/2016  . IR RADIOLOGIST EVAL & MGMT  02/13/2017  . RADIOLOGY WITH ANESTHESIA N/A 11/25/2016   Procedure: RADIOLOGY WITH ANESTHESIA;  Surgeon: Radiologist, Medication, MD;  [facility]: [facility] OR;  Service: Radiology;  Laterality: N/A;    There were no vitals filed for this visit.  Subjective Assessment - 07/07/18 0936    Subjective  Pt. reports he is doing well today in anticipation of his trip to [place].    Patient is accompanied by:  Family member    Pertinent History  Pt. is a 73 y.o. male who sustained a  CVA with Left sided hemiparesis. Pt. was transferred to [facility]. He underwent CTA brain showed short segment near occlusion of proximal R-ICA with associated intraluminal thrombus --likely due to dissection and emergent large vessel occlusion with right M2 occlusion and evolving right frontal lobe infarct.  He underwent cerebral angio with  complete revascularization of occluded superior division of R-MCA and near complete revascularization of proximal R-ICA with stent assisted angioplasty.  Stroke felt to be embolic in setting of ruptured plaque R-ICA and he was placed on ASA and brillinta due to stent. Pt. was transferred to inpatient rehab for several weeks, had home health therapy upon discharge, and now is ready for outpatient therapy services.    Limitations  Dominant LUE functioning, left hand edema, distal ROM, coordination, and overal strength.    Patient Stated Goals  To regain the use of his LUE.    Currently in Pain?  No/denies    Pain Score  0-No pain      OT TREATMENT    Neuro muscular re-education:  Pt. worked on left hand thumb opposition skills. Pt. worked on formulating, and holding thumb opposition initially using transpore tape. Pt. was provided with transpore tape to work on the exercises while on vacation in [place].  Self-care:  Pt. worked on Mining engineer with emphasis placed on stabilization  of grasp, and placement of thumb, 2nd digit, and 3rd digit on the pen when performing writing tasks. Pt. required frequent rest periods with left hand stretches, and periods of repositioning the pen in the hand. Pt. had positive deviation below the line to the right both when formulating lists of words, and sentences.                              OT Education - 07/07/18 0957    Education provided  Yes    Education Details  [facility] , pen grasp, writing.    Person(s) Educated  Patient    Methods  Explanation;Demonstration;Verbal cues    Comprehension   Verbalized understanding;Returned demonstration          OT Long Term Goals - 07/04/18 0946      OT LONG TERM GOAL #3   Title  Pt. left hand grip with  increase by 5# in preparation for opening a jar.    Baseline  05/14/2018: Pt. is now able to open jars, and containers.    Time  12    Status  Achieved      OT LONG TERM GOAL #8   Title  Pt. will increase Left pinch strength to be able to grasp and hold items during ADL tasks.    Baseline  05/14/2018: Pt. is now able to grasp, and hold items for ADLs.    Time  12    Period  Weeks    Status  Achieved      OT LONG TERM GOAL  #9   Baseline  Pt. will write one sentence with 75% legibility and efficiently while maintaing grasp on pen 100% of the time.    Time  12    Period  Weeks    Status  On-going      OT LONG TERM GOAL  #10   TITLE  Pt. will independently use his left hand to grasp and efficiently accomodate to the varying weights of ADL objects during self care tasks.     Baseline  05/14/2018: Pt. continues to work on grasping and adjusting his hand to the varying weights of objects.    Time  12    Period  Weeks    Status  On-going      OT LONG TERM GOAL  #11   TITLE  Pt. will improve [facility] skills to be able to independently manipulate items for ADLs/IADLs when UEs are elevated when reaching into the closet, or microwave.    Baseline  05/14/2018: Pt. continues to have difficulty manipulating objects when UEs elevated     Time  12    Period  Weeks    Status  On-going            Plan - 07/07/18 1004    Clinical Impression Statement Pt. is driving to [place] for a couple of weeks. Pt. continues to make steady progress with left hand function.  Pt. continues to work on improving left hand grasp on the pen. Pt. continues to work on improving  left hand thumb opposition, hand function, and [facility] skills in order to be able to write legibly, and efficiently while holding the pen stable with a mature hand grasp.      Occupational  performance deficits (Please refer to evaluation for details):  ADL's;IADL's    Rehab Potential  Excellent    Clinical Decision Making  Several treatment options, min-mod task  modification necessary    OT Frequency  2x / week    OT Duration  12 weeks    OT Treatment/Interventions  Self-care/ADL training;Therapeutic exercise;Neuromuscular education;Patient/family education;Energy conservation;Therapeutic activities;DME and/or AE instruction    Consulted and Agree with Plan of Care  Patient       Patient will benefit from skilled therapeutic intervention in order to improve the following deficits and impairments:     Visit Diagnosis: Muscle weakness (generalized)  Other lack of coordination    Problem List Patient Active Problem List   Diagnosis Date Noted  . Pure hypercholesterolemia 05/28/2017  . Erectile dysfunction 05/28/2017  . Left hemiparesis (HCC)   . Essential hypertension 11/29/2016  . 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 Chronic hepatitis C without hepatic coma (HCC) 07/22/2013    Olegario Messier, MS, OTR/L 07/07/2018, 10:20 AM  [place] [facility] MAIN [facility] SERVICES [address] [place], [place], [address] Phone: [phone]   Fax:  [phone]  Name: Kannan Pamintuan MRN: [id number] Date of Birth: 10-Mar-1946

## 2018-07-09 ENCOUNTER — Encounter: Payer: Medicare Other | Admitting: Occupational Therapy

## 2018-07-14 ENCOUNTER — Encounter: Payer: Medicare Other | Admitting: Occupational Therapy

## 2018-07-16 ENCOUNTER — Encounter: Payer: Medicare Other | Admitting: Occupational Therapy

## 2018-07-21 ENCOUNTER — Ambulatory Visit: Payer: Medicare Other | Admitting: Occupational Therapy

## 2018-07-21 ENCOUNTER — Encounter: Payer: Self-pay | Admitting: Occupational Therapy

## 2018-07-21 ENCOUNTER — Other Ambulatory Visit: Payer: Self-pay

## 2018-07-21 DIAGNOSIS — M6281 Muscle weakness (generalized): Secondary | ICD-10-CM

## 2018-07-21 DIAGNOSIS — R278 Other lack of coordination: Secondary | ICD-10-CM | POA: Diagnosis not present

## 2018-07-21 NOTE — Therapy (Signed)
[facility] MAIN [facility] SERVICES [address] [place], [place], [address] Phone: [phone]   Fax:  [phone]  Occupational Therapy Treatment  Patient Details  Name: Jason Nolan MRN: [id number] Date of Birth: Oct 17, 1945 No data recorded  Encounter Date: 07/21/2018  OT End of Session - 07/21/18 0944    Visit Number  112    Number of Visits  120    Date for OT Re-Evaluation  08/11/18    Authorization Type  Progress reporting period starting 07/02/2018    OT Start Time  0930    OT Stop Time  1015    OT Time Calculation (min)  45 min    Activity Tolerance  Patient tolerated treatment well    Behavior During Therapy  [facility] for tasks assessed/performed       Past Medical History:  Diagnosis Date  . Glaucoma   . Hepatitis C   . Stroke [facility])     Past Surgical History:  Procedure Laterality Date  . COLONOSCOPY WITH PROPOFOL N/A 07/01/2018   Procedure: COLONOSCOPY WITH PROPOFOL;  Surgeon: Wyline Mood, MD;  Location: [facility] ENDOSCOPY;  Service: Gastroenterology;  Laterality: N/A;  . IR ANGIO INTRA EXTRACRAN SEL COM CAROTID INNOMINATE UNI L MOD SED  11/25/2016  . IR ANGIO VERTEBRAL SEL SUBCLAVIAN INNOMINATE UNI R MOD SED  11/25/2016  . IR ANGIO VERTEBRAL SEL VERTEBRAL UNI L MOD SED  11/25/2016  . IR INTRAVSC STENT CERV CAROTID W/O EMB-PROT MOD SED INC ANGIO  11/25/2016  . IR PERCUTANEOUS ART THROMBECTOMY/INFUSION INTRACRANIAL INC DIAG ANGIO  11/25/2016  . IR RADIOLOGIST EVAL & MGMT  02/13/2017  . RADIOLOGY WITH ANESTHESIA N/A 11/25/2016   Procedure: RADIOLOGY WITH ANESTHESIA;  Surgeon: Radiologist, Medication, MD;  [facility] OR;  Service: Radiology;  Laterality: N/A;    There were no vitals filed for this visit.  Subjective Assessment - 07/21/18 0935    Subjective   Pt. reports he is doing well today in anticipation of his trip to [place].    Patient is accompanied by:  Family member    Pertinent History  Pt. is a 73 y.o. male who sustained a  CVA with Left sided hemiparesis. Pt. was transferred to [facility]. He underwent CTA brain showed short segment near occlusion of proximal R-ICA with associated intraluminal thrombus --likely due to dissection and emergent large vessel occlusion with right M2 occlusion and evolving right frontal lobe infarct.  He underwent cerebral angio with  complete revascularization of occluded superior division of R-MCA and near complete revascularization of proximal R-ICA with stent assisted angioplasty.  Stroke felt to be embolic in setting of ruptured plaque R-ICA and he was placed on ASA and brillinta due to stent. Pt. was transferred to inpatient rehab for several weeks, had home health therapy upon discharge, and now is ready for outpatient therapy services.    Limitations  Dominant LUE functioning, left hand edema, distal ROM, coordination, and overal strength.    Currently in Pain?  No/denies      OT TREATMENT    Neuro muscular re-education:  Pt. worked on grasping one inch resistive cubes alternating thumb opposition to the tip of the 2nd, and 5th digits. The board was positioned at a vertical angle. Pt. Worked with, and without resistance. Pt. With improved ability to bring with tip of the 2nd digit, and thumb together.  Selfcare:  Pt. worked on Careers information officer in printed form. Pt. required increased time to complete, and repositioning of the paper.  Pt. Had positive deviation below the line in the 3rd, and 4th lines.                         OT Education - 07/21/18 [phone]    Education provided  Yes    Education Details  [employer] , pen grasp, writing.    Person(s) Educated  Patient    Methods  Explanation;Demonstration;Verbal cues    Comprehension  Verbalized understanding;Returned demonstration          OT Long Term Goals - 07/04/18 0946      OT LONG TERM GOAL #3   Title  Pt. left hand grip with  increase by 5# in preparation for opening a jar.    Baseline  05/14/2018:  Pt. is now able to open jars, and containers.    Time  12    Status  Achieved      OT LONG TERM GOAL #8   Title  Pt. will increase Left pinch strength to be able to grasp and hold items during ADL tasks.    Baseline  05/14/2018: Pt. is now able to grasp, and hold items for ADLs.    Time  12    Period  Weeks    Status  Achieved      OT LONG TERM GOAL  #9   Baseline  Pt. will write one sentence with 75% legibility and efficiently while maintaing grasp on pen 100% of the time.    Time  12    Period  Weeks    Status  On-going      OT LONG TERM GOAL  #10   TITLE  Pt. will independently use his left hand to grasp and efficiently accomodate to the varying weights of ADL objects during self care tasks.     Baseline  05/14/2018: Pt. continues to work on grasping and adjusting his hand to the varying weights of objects.    Time  12    Period  Weeks    Status  On-going      OT LONG TERM GOAL  #11   TITLE  Pt. will improve [facility] skills to be able to independently manipulate items for ADLs/IADLs when UEs are elevated when reaching into the closet, or microwave.    Baseline  05/14/2018: Pt. continues to have difficulty manipulating objects when UEs elevated     Time  12    Period  Weeks    Status  On-going            Plan - 07/21/18 0944    Clinical Impression Statement  Pt. has been to [place] and back. Pt. continues to make steady progress overall with left hand function skills. Pt. is improving with writing legibility skills, while maintaning a  mature tripod grasp on a pen with a large built up handle. pt. conitnues to worjk on improving hand function skills, and translatory movements of the hand in order to improvign ADL, and IADL functioning.    Occupational performance deficits (Please refer to evaluation for details):  ADL's;IADL's    Rehab Potential  Excellent    Clinical Decision Making  Several treatment options, min-mod task modification necessary    OT Frequency  2x / week     OT Duration  12 weeks    OT Treatment/Interventions  Self-care/ADL training;Therapeutic exercise;Neuromuscular education;Patient/family education;Energy conservation;Therapeutic activities;DME and/or AE instruction    Consulted and Agree with Plan of Care  Patient  Patient will benefit from skilled therapeutic intervention in order to improve the following deficits and impairments:     Visit Diagnosis: Muscle weakness (generalized)    Problem List Patient Active Problem List   Diagnosis Date Noted  . Pure hypercholesterolemia 05/28/2017  . Erectile dysfunction 05/28/2017  . Left hemiparesis (HCC)   . Essential hypertension 11/29/2016  . 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 Chronic hepatitis C without hepatic coma (HCC) 07/22/2013    Olegario Messier, MS, OTR/L 07/21/2018, 12:23 PM  [place] [facility] MAIN [facility] SERVICES [address] [place], [place], [address] Phone: [phone]   Fax:  [phone]  Name: Nahuel Ence MRN: [id number] Date of Birth: 1945/07/20

## 2018-07-23 ENCOUNTER — Ambulatory Visit: Payer: Medicare Other | Admitting: Occupational Therapy

## 2018-07-23 ENCOUNTER — Other Ambulatory Visit: Payer: Self-pay

## 2018-07-23 DIAGNOSIS — M6281 Muscle weakness (generalized): Secondary | ICD-10-CM

## 2018-07-23 DIAGNOSIS — R278 Other lack of coordination: Secondary | ICD-10-CM

## 2018-07-27 ENCOUNTER — Encounter: Payer: Self-pay | Admitting: Occupational Therapy

## 2018-07-27 ENCOUNTER — Ambulatory Visit: Payer: Medicare Other | Admitting: Occupational Therapy

## 2018-07-27 NOTE — Therapy (Signed)
[facility] MAIN [facility] SERVICES [address] [place], [place], [address] Phone: [phone]   Fax:  [phone]  Patient Details  Name: Jason Nolan MRN: [id number] Date of Birth: 1945-12-25 Referring Provider:  No ref. provider found  Encounter Date: 07/27/2018  Attempted to reach out to the pt. via telephone during this pandemic with no response. Left a voicemail for the pt.  Olegario Messier, MS, OTR/L 07/27/2018, 2:11 PM  [facility] MAIN [facility] SERVICES [address] [place], [place], [address] Phone: [phone]   Fax:  [phone]

## 2018-07-29 ENCOUNTER — Ambulatory Visit: Payer: Medicare Other | Admitting: Occupational Therapy

## 2018-07-31 ENCOUNTER — Encounter: Payer: Self-pay | Admitting: Occupational Therapy

## 2018-07-31 NOTE — Therapy (Signed)
[facility] MAIN [facility] SERVICES [address] [place], [place], [address] Phone: [phone]   Fax:  [phone]  Occupational Therapy Treatment  Patient Details  Name: Jason Nolan MRN: [id number] Date of Birth: [birth year] No data recorded  Encounter Date: 07/23/2018  OT End of Session - 07/31/18 1415    Visit Number  113    Number of Visits  120    Date for OT Re-Evaluation  08/11/18    OT Start Time  0926    OT Stop Time  1028    OT Time Calculation (min)  62 min    Activity Tolerance  Patient tolerated treatment well    Behavior During Therapy  [facility] for tasks assessed/performed       Past Medical History:  Diagnosis Date  . Glaucoma   . Hepatitis C   . Stroke [facility])     Past Surgical History:  Procedure Laterality Date  . COLONOSCOPY WITH PROPOFOL N/A 07/01/2018   Procedure: COLONOSCOPY WITH PROPOFOL;  Surgeon: Wyline Mood, MD;  Location: [facility] ENDOSCOPY;  Service: Gastroenterology;  Laterality: N/A;  . IR ANGIO INTRA EXTRACRAN SEL COM CAROTID INNOMINATE UNI L MOD SED  11/25/2016  . IR ANGIO VERTEBRAL SEL SUBCLAVIAN INNOMINATE UNI R MOD SED  11/25/2016  . IR ANGIO VERTEBRAL SEL VERTEBRAL UNI L MOD SED  11/25/2016  . IR INTRAVSC STENT CERV CAROTID W/O EMB-PROT MOD SED INC ANGIO  11/25/2016  . IR PERCUTANEOUS ART THROMBECTOMY/INFUSION INTRACRANIAL INC DIAG ANGIO  11/25/2016  . IR RADIOLOGIST EVAL & MGMT  02/13/2017  . RADIOLOGY WITH ANESTHESIA N/A 11/25/2016   Procedure: RADIOLOGY WITH ANESTHESIA;  Surgeon: Radiologist, Medication, MD;  Location: [facility] OR;  Service: Radiology;  Laterality: N/A;    There were no vitals filed for this visit.  Subjective Assessment - 07/31/18 1415    Subjective    Patient reports he is doing well he had a nice visit with his family in [place]    Pertinent History  Pt. is a 73 y.o. 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 Pt. was transferred to [facility]. He underwent CTA brain showed short  segment near occlusion of proximal R-ICA with associated intraluminal thrombus --likely due to dissection and emergent large vessel occlusion with right M2 occlusion and evolving right frontal lobe infarct.  He underwent cerebral angio with  complete revascularization of occluded superior division of R-MCA and near complete revascularization of proximal R-ICA with stent assisted angioplasty.  Stroke felt to be embolic in setting of ruptured plaque R-ICA and he was placed on ASA and brillinta due to stent. Pt. was transferred to inpatient rehab for several weeks, had home health therapy upon discharge, and now is ready for outpatient therapy services.    Limitations  Dominant LUE functioning, left hand edema, distal ROM, coordination, and overal strength.    Patient Stated Goals  To regain the use of his LUE.    Currently in Pain?  No/denies    Pain Score  0-No pain    Multiple Pain Sites  No         Neuromuscular Reeducation:  Patient reports he is doing well he had a nice visit with his family in [place].  Patient seen this date for focus on hand writing skills cues for using a tripod grasp with pen, small pen grip utilized. Patient demonstrates more awareness when he switches to a thumb wrap and demonstrates the ability to self correct most times. Patient focused  on check writing this date with using print and emphasizing legibility and size of letters/numbers.  Patient seen for fine motor coordination skills of turning and flipping [place] discs from one color side to the other. Occasional cues required for prehension patterns and to facilitate isolated use of index finger.  Response to treatment: Patient continues to progress in all areas. Handwriting is becoming more legible and he is able to demonstrate a more secure pin grasp. He demonstrates fine motor coordination skills however lax speed and dexterity with tasks and needs more work on isolated Patient continues to progress in all  areas. Handwriting is becoming more legible and he is able to demonstrate a more secure pin grasp. He demonstrates fine motor coordination skills however lacks speed and dexterity with tasks and needs more work on isolated Finger movements to assist with manipulation skills, translatory skills of the hand and using the hand for storage. Continue to work towards goals and plan of care to increase independence in daily tasks.                   OT Education - 07/31/18 1415    Education provided  Yes    Person(s) Educated  Patient    Methods  Explanation;Demonstration;Verbal cues    Comprehension  Verbalized understanding;Returned demonstration          OT Long Term Goals - 07/31/18 1416      OT LONG TERM GOAL #3   Title  Pt. left hand grip with  increase by 5# in preparation for opening a jar.    Baseline  05/14/2018: Pt. is now able to open jars, and containers.    Time  12    Status  Achieved      OT LONG TERM GOAL #8   Title  Pt. will increase Left pinch strength to be able to grasp and hold items during ADL tasks.    Baseline  05/14/2018: Pt. is now able to grasp, and hold items for ADLs.    Time  12    Period  Weeks    Status  Achieved      OT LONG TERM GOAL  #9   Baseline  Pt. will write one sentence with 75% legibility and efficiently while maintaing grasp on pen 100% of the time.    Time  12    Period  Weeks    Status  On-going      OT LONG TERM GOAL  #10   TITLE  Pt. will independently use his left hand to grasp and efficiently accomodate to the varying weights of ADL objects during self care tasks.     Baseline  05/14/2018: Pt. continues to work on grasping and adjusting his hand to the varying weights of objects.    Time  12    Period  Weeks    Status  On-going      OT LONG TERM GOAL  #11   TITLE  Pt. will improve [facility] skills to be able to independently manipulate items for ADLs/IADLs when UEs are elevated when reaching into the closet, or microwave.     Baseline  05/14/2018: Pt. continues to have difficulty manipulating objects when UEs elevated     Time  12    Period  Weeks    Status  On-going            Plan - 07/31/18 1416    Clinical Impression Statement  Patient continues to progress in all areas. Handwriting is  becoming more legible and he is able to demonstrate a more secure pin grasp. He demonstrates fine motor coordination skills however lax speed and dexterity with tasks and needs more work on isolated Patient continues to progress in all areas. Handwriting is becoming more legible and he is able to demonstrate a more secure pin grasp. He demonstrates fine motor coordination skills however lacks speed and dexterity with tasks and needs more work on isolated Finger movements to assist with manipulation skills, translatory skills of the hand and using the hand for storage. Continue to work towards goals and plan of care to increase independence in daily tasks.     Occupational performance deficits (Please refer to evaluation for details):  ADL's;IADL's    Rehab Potential  Excellent    Clinical Decision Making  Several treatment options, min-mod task modification necessary    OT Frequency  2x / week    OT Duration  12 weeks    OT Treatment/Interventions  Self-care/ADL training;Therapeutic exercise;Neuromuscular education;Patient/family education;Energy conservation;Therapeutic activities;DME and/or AE instruction    Consulted and Agree with Plan of Care  Patient       Patient will benefit from skilled therapeutic intervention in order to improve the following deficits and impairments:     Visit Diagnosis: Muscle weakness (generalized)  Other lack of coordination    Problem List Patient Active Problem List   Diagnosis Date Noted  . Pure hypercholesterolemia 05/28/2017  . Erectile dysfunction 05/28/2017  . Left hemiparesis (HCC)   . Essential hypertension 11/29/2016  . Acute ischemic stroke (HCC) - R MCA embolic stroke in  setting of ruptured R ICA plaque w dissection, s/p R MCA stent and thrombectomy 11/25/2016  . Chronic hepatitis C without hepatic coma (HCC) 07/22/2013    T , OTR/L, CLT  , 08/01/2018, 10:13 AM  [place] [facility] MAIN [facility] SERVICES [address] [place], [place], [address] Phone: [phone]   Fax:  [phone]  Name: Laureano Collin MRN: [id number] Date of Birth: 07/21/1945

## 2018-08-05 ENCOUNTER — Encounter: Payer: Medicare Other | Admitting: Occupational Therapy

## 2018-08-10 ENCOUNTER — Encounter: Payer: Medicare Other | Admitting: Occupational Therapy

## 2018-08-12 ENCOUNTER — Encounter: Payer: Medicare Other | Admitting: Occupational Therapy

## 2018-08-14 ENCOUNTER — Encounter: Payer: Self-pay | Admitting: Occupational Therapy

## 2018-08-14 NOTE — Therapy (Signed)
[place] [facility] MAIN [facility] SERVICES [address] [place], [place], [address] Phone: [phone]   Fax:  [phone]  Patient Details  Name: Jason Nolan MRN: [id number] Date of Birth: 1945-09-03 Referring Provider:  No ref. provider found  Encounter Date: 08/14/2018  The [facility] outpatient clinics are closed at this time due to the COVID-19 epidemic. The patient was contacted in regards to their therapy services. The patient is in agreement that they are safe and consent to being on hold for therapy services until the [facility] outpatient facilities reopen. At which time, the patient will be contacted to schedule an appointment to resume therapy services.   Olegario Messier, MS, OTR/L 08/14/2018, 2:53 PM  [place] [facility] MAIN [facility] SERVICES [address] [place], [place], [address] Phone: [phone]   Fax:  [phone]

## 2018-08-17 ENCOUNTER — Encounter: Payer: Medicare Other | Admitting: Occupational Therapy

## 2018-08-19 ENCOUNTER — Encounter: Payer: Medicare Other | Admitting: Occupational Therapy

## 2018-08-24 ENCOUNTER — Encounter: Payer: Medicare Other | Admitting: Occupational Therapy

## 2018-08-26 ENCOUNTER — Encounter: Payer: Medicare Other | Admitting: Occupational Therapy

## 2018-08-31 ENCOUNTER — Encounter: Payer: Medicare Other | Admitting: Occupational Therapy

## 2018-09-02 ENCOUNTER — Encounter: Payer: Medicare Other | Admitting: Occupational Therapy

## 2018-09-22 ENCOUNTER — Other Ambulatory Visit: Payer: Self-pay

## 2018-09-22 ENCOUNTER — Encounter: Payer: Self-pay | Admitting: Occupational Therapy

## 2018-09-22 ENCOUNTER — Ambulatory Visit: Payer: Medicare Other | Attending: Internal Medicine | Admitting: Occupational Therapy

## 2018-09-22 DIAGNOSIS — M6281 Muscle weakness (generalized): Secondary | ICD-10-CM | POA: Diagnosis not present

## 2018-09-22 DIAGNOSIS — R278 Other lack of coordination: Secondary | ICD-10-CM | POA: Diagnosis not present

## 2018-09-22 NOTE — Therapy (Signed)
[facility] MAIN [facility] SERVICES [address] [place], [place], [address] Phone: [phone]   Fax:  [phone]  Occupational Therapy Treatment/Recertification Note  Patient Details  Name: Jason Nolan MRN: [id number] Date of Birth: Jul 20, 1945 No data recorded  Encounter Date: 09/22/2018   The patient has been informed of current processes in place at Outpatient Rehab to protect patients from Covid-19 exposure including social distancing, schedule modifications, and new cleaning procedures. After discussing their particular risk with a therapist based on the patient's personal risk factors, the patient has decided to proceed with in-person therapy.    OT End of Session - 09/22/18 0836    Visit Number  114    Number of Visits  120    Date for OT Re-Evaluation  12/15/18    Authorization Type  Progress reporting period starting 07/02/2018    OT Start Time  0830    OT Stop Time  0915    OT Time Calculation (min)  45 min    Activity Tolerance  Patient tolerated treatment well    Behavior During Therapy  [facility] for tasks assessed/performed       Past Medical History:  Diagnosis Date  . Glaucoma   . Hepatitis C   . Stroke [facility])     Past Surgical History:  Procedure Laterality Date  . COLONOSCOPY WITH PROPOFOL N/A 07/01/2018   Procedure: COLONOSCOPY WITH PROPOFOL;  Surgeon: Wyline Mood, MD;  Location: [facility] ENDOSCOPY;  Service: Gastroenterology;  Laterality: N/A;  . IR ANGIO INTRA EXTRACRAN SEL COM CAROTID INNOMINATE UNI L MOD SED  11/25/2016  . IR ANGIO VERTEBRAL SEL SUBCLAVIAN INNOMINATE UNI R MOD SED  11/25/2016  . IR ANGIO VERTEBRAL SEL VERTEBRAL UNI L MOD SED  11/25/2016  . IR INTRAVSC STENT CERV CAROTID W/O EMB-PROT MOD SED INC ANGIO  11/25/2016  . IR PERCUTANEOUS ART THROMBECTOMY/INFUSION INTRACRANIAL INC DIAG ANGIO  11/25/2016  . IR RADIOLOGIST EVAL & MGMT  02/13/2017  . RADIOLOGY WITH ANESTHESIA N/A 11/25/2016   Procedure: RADIOLOGY WITH  ANESTHESIA;  Surgeon: Radiologist, Medication, MD;  Location: [facility] OR;  Service: Radiology;  Laterality: N/A;    There were no vitals filed for this visit.  Subjective Assessment - 09/22/18 0835    Subjective   Pt. reports that he has doing things at home with his left hand.    Patient is accompanied by:  Family member    Pertinent History  Pt. is a 73 y.o. 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 Pt. was transferred to [facility]. He underwent CTA brain showed short segment near occlusion of proximal R-ICA with associated intraluminal thrombus --likely due to dissection and emergent large vessel occlusion with right M2 occlusion and evolving right frontal lobe infarct.  He underwent cerebral angio with  complete revascularization of occluded superior division of R-MCA and near complete revascularization of proximal R-ICA with stent assisted angioplasty.  Stroke felt to be embolic in setting of ruptured plaque R-ICA and he was placed on ASA and brillinta due to stent. Pt. was transferred to inpatient rehab for several weeks, had home health therapy upon discharge, and now is ready for outpatient therapy services.    Limitations  Dominant LUE functioning, left hand edema, distal ROM, coordination, and overal strength.    Currently in Pain?  No/denies         [facility] OT Assessment - 09/22/18 0844      Coordination   Left 9 Hole Peg Test  1 min. &  3 sec.   increased compensation proximally. dropped multiple pegs.     Hand Function   Right Hand Grip (lbs)  95    Right Hand Lateral Pinch  29 lbs    Right Hand 3 Point Pinch  20 lbs    Left Hand Grip (lbs)  55    Left Hand Lateral Pinch  17 lbs    Left 3 point pinch  12 lbs       OT TREATMENT   Measurements were obtained, and goals were reviewed with the pt.   Pt. was upgraded to blue theraputty for left grip, and pinch strengthening.                   OT Education - 09/22/18 0836    Education provided  Yes     Education Details  FMC , pen grasp, writing.    Person(s) Educated  Patient    Methods  Explanation;Demonstration;Verbal cues    Comprehension  Verbalized understanding;Returned demonstration          OT Long Term Goals - 09/22/18 0858      OT LONG TERM GOAL #6   Title  Pt. will improve left hand coordination skills to be able to button clothing.    Baseline  09/22/2018: Pt. has had a decrease in 9 hole peg test score. Pt. continues to present with limited left hand FMC, speed, and dexterity, Pt. continues to have difficulty with buttoning.    Time  12    Period  Weeks    Status  On-going    Target Date  12/15/18      OT LONG TERM GOAL  #9   Baseline  Pt. will write one sentence with 75% legibility and efficiently while maintainingg grasp on pen 100% of the time.    Time  12    Period  Weeks    Status  On-going    Target Date  12/15/18      OT LONG TERM GOAL  #10   TITLE  Pt. will independently use his left hand to grasp and efficiently accomodate to the varying weights of ADL objects during self care tasks.     Baseline  09/22/2018: Pt. continues to work on grasping and adjusting his hand to the varying weights of objects.    Time  12    Period  Weeks    Status  On-going    Target Date  12/15/18      OT LONG TERM GOAL  #11   TITLE  Pt. will improve [facility] skills to be able to independently manipulate items for ADLs/IADLs when UEs are elevated when reaching into the closet, or microwave.    Baseline  09/22/2018: Pt. continues to have difficulty manipulating objects when UEs elevated     Time  12    Period  Weeks    Status  On-going    Target Date  12/15/18            Plan - 09/22/18 0837    Clinical Impression Statement  Pt. has noticed less swellng, and stiffness in his left hand. Pt. is now able to grip, and hold items during ADLS, and IADLs with his left hand. Pt. has improved with his ability to formulate a composite fist. Pt. is now able to grip, and hold a suitcase  without losing his grip. Pt. continues to present with limited thumb opposition, and decreased left pinch strength. Pt. continues to have difficulty stabilizing a fork  duirng self-feeding with his left hand, and stabilizing a pen during writing limiting speed, and legibility. Pt. conitnues to work on improving left hand function in order to improve engagement in ADLs, and IADLs.     Occupational performance deficits (Please refer to evaluation for details):  ADL's;IADL's    Rehab Potential  Excellent    Clinical Decision Making  Several treatment options, min-mod task modification necessary    OT Frequency  2x / week    OT Duration  12 weeks    OT Treatment/Interventions  Self-care/ADL training;Therapeutic exercise;Neuromuscular education;Patient/family education;Energy conservation;Therapeutic activities;DME and/or AE instruction    Consulted and Agree with Plan of Care  Patient       Patient will benefit from skilled therapeutic intervention in order to improve the following deficits and impairments:     Visit Diagnosis: Muscle weakness (generalized)  Other lack of coordination    Problem List Patient Active Problem List   Diagnosis Date Noted  . Pure hypercholesterolemia 05/28/2017  . Erectile dysfunction 05/28/2017  . Left hemiparesis (HCC)   . Essential hypertension 11/29/2016  . 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 Chronic hepatitis C without hepatic coma (HCC) 07/22/2013    Olegario MessierElaine Davonna Ertl, MS, OTR/L 09/22/2018, 9:10 AM  [place] [facility] MAIN [facility] [address] [place], [place], [id number] Phone: [phone]   Fax:  [phone]  Name: Jason Nolan MRN: [id number] Date of Birth: [birth year]

## 2018-09-24 ENCOUNTER — Encounter: Payer: Self-pay | Admitting: Occupational Therapy

## 2018-09-24 ENCOUNTER — Ambulatory Visit: Payer: Medicare Other | Admitting: Occupational Therapy

## 2018-09-24 ENCOUNTER — Other Ambulatory Visit: Payer: Self-pay

## 2018-09-24 DIAGNOSIS — R278 Other lack of coordination: Secondary | ICD-10-CM

## 2018-09-24 DIAGNOSIS — M6281 Muscle weakness (generalized): Secondary | ICD-10-CM | POA: Diagnosis not present

## 2018-09-24 NOTE — Therapy (Signed)
[facility] MAIN [facility] SERVICES [address] [place], [place], [address] Phone: [phone]   Fax:  [phone]  Occupational Therapy Treatment  Patient Details  Name: Jason Nolan MRN: [id number] Date of Birth: [birth year] No data recorded  Encounter Date: 09/24/2018  OT End of Session - 09/24/18 0839    Visit Number  115    Number of Visits  120    Date for OT Re-Evaluation  12/15/18    Authorization Type  Progress reporting period starting 07/02/2018    OT Start Time  0833    OT Stop Time  0915    OT Time Calculation (min)  42 min    Activity Tolerance  Patient tolerated treatment well    Behavior During Therapy  [facility] for tasks assessed/performed       Past Medical History:  Diagnosis Date  . Glaucoma   . Hepatitis C   . Stroke [facility])     Past Surgical History:  Procedure Laterality Date  . COLONOSCOPY WITH PROPOFOL N/A 07/01/2018   Procedure: COLONOSCOPY WITH PROPOFOL;  Surgeon: Wyline Mood, MD;  Location: [facility] ENDOSCOPY;  Service: Gastroenterology;  Laterality: N/A;  . IR ANGIO INTRA EXTRACRAN SEL COM CAROTID INNOMINATE UNI L MOD SED  11/25/2016  . IR ANGIO VERTEBRAL SEL SUBCLAVIAN INNOMINATE UNI R MOD SED  11/25/2016  . IR ANGIO VERTEBRAL SEL VERTEBRAL UNI L MOD SED  11/25/2016  . IR INTRAVSC STENT CERV CAROTID W/O EMB-PROT MOD SED INC ANGIO  11/25/2016  . IR PERCUTANEOUS ART THROMBECTOMY/INFUSION INTRACRANIAL INC DIAG ANGIO  11/25/2016  . IR RADIOLOGIST EVAL & MGMT  02/13/2017  . RADIOLOGY WITH ANESTHESIA N/A 11/25/2016   Procedure: RADIOLOGY WITH ANESTHESIA;  Surgeon: Radiologist, Medication, MD;  Location: [facility] OR;  Service: Radiology;  Laterality: N/A;    There were no vitals filed for this visit.  Subjective Assessment - 09/24/18 0837    Subjective   Pt. reports he is engaging his right hand more at home.    Patient is accompanied by:  Family member    Pertinent History  Pt. is a 73 y.o. male who sustained a CVA with Left sided  hemiparesis. Pt. was transferred to [facility]. He underwent CTA brain showed short segment near occlusion of proximal R-ICA with associated intraluminal thrombus --likely due to dissection and emergent large vessel occlusion with right M2 occlusion and evolving right frontal lobe infarct.  He underwent cerebral angio with  complete revascularization of occluded superior division of R-MCA and near complete revascularization of proximal R-ICA with stent assisted angioplasty.  Stroke felt to be embolic in setting of ruptured plaque R-ICA and he was placed on ASA and brillinta due to stent. Pt. was transferred to inpatient rehab for several weeks, had home health therapy upon discharge, and now is ready for outpatient therapy services.    Limitations  Dominant LUE functioning, left hand edema, distal ROM, coordination, and overal strength.    Currently in Pain?  No/denies      OT TREATMENT    Neuro muscular re-education:  Pt. worked on translatory movements of the hand. Pt. worked on manipulating 1/2" flat marbles, circular sphere marbles, and small magnetic pegs. Pt. required cues for hand position.   Response to treatment:  Pt. presents with left hand stiffness, however is improving with thumb movements, and thumb opposition.                          OT  Education - 09/24/18 0839    Education provided  Yes    Education Details  FMC , pen grasp, writing.    Person(s) Educated  Patient    Methods  Explanation;Demonstration;Verbal cues    Comprehension  Verbalized understanding;Returned demonstration          OT Long Term Goals - 09/22/18 0858      OT LONG TERM GOAL #6   Title  Pt. will improve left hand coordination skills to be able to button clothing.    Baseline  09/22/2018: Pt. has had a decrease in 9 hole peg test score. Pt. continues to present with limited left hand FMC, speed, and dexterity, Pt. continues to have difficulty with buttoning.    Time  12     Period  Weeks    Status  On-going    Target Date  12/15/18      OT LONG TERM GOAL  #9   Baseline  Pt. will write one sentence with 75% legibility and efficiently while maintainingg grasp on pen 100% of the time.    Time  12    Period  Weeks    Status  On-going    Target Date  12/15/18      OT LONG TERM GOAL  #10   TITLE  Pt. will independently use his left hand to grasp and efficiently accomodate to the varying weights of ADL objects during self care tasks.     Baseline  09/22/2018: Pt. continues to work on grasping and adjusting his hand to the varying weights of objects.    Time  12    Period  Weeks    Status  On-going    Target Date  12/15/18      OT LONG TERM GOAL  #11   TITLE  Pt. will improve [facility] skills to be able to independently manipulate items for ADLs/IADLs when UEs are elevated when reaching into the closet, or microwave.    Baseline  09/22/2018: Pt. continues to have difficulty manipulating objects when UEs elevated     Time  12    Period  Weeks    Status  On-going    Target Date  12/15/18            Plan - 09/24/18 0840    Occupational performance deficits (Please refer to evaluation for details):  ADL's;IADL's    Rehab Potential  Excellent    Clinical Decision Making  Several treatment options, min-mod task modification necessary    OT Frequency  2x / week    OT Duration  12 weeks    OT Treatment/Interventions  Self-care/ADL training;Therapeutic exercise;Neuromuscular education;Patient/family education;Energy conservation;Therapeutic activities;DME and/or AE instruction    Consulted and Agree with Plan of Care  Patient       Patient will benefit from skilled therapeutic intervention in order to improve the following deficits and impairments:     Visit Diagnosis: Muscle weakness (generalized)  Other lack of coordination    Problem List Patient Active Problem List   Diagnosis Date Noted  . Pure hypercholesterolemia 05/28/2017  . Erectile  dysfunction 05/28/2017  . Left hemiparesis (HCC)   . Essential hypertension 11/29/2016  . 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 Chronic hepatitis C without hepatic coma (HCC) 07/22/2013    Olegario MessierElaine Rishab Stoudt 09/24/2018, 8:50 AM  [place] [facility] [address]  [place], [place], [address] Phone: [phone]   Fax:  [phone]  Name: Temiloluwa Laredo MRN: [id number] Date of Birth: December 11, 1945

## 2018-09-29 ENCOUNTER — Ambulatory Visit: Payer: Medicare Other | Admitting: Occupational Therapy

## 2018-09-29 ENCOUNTER — Encounter: Payer: Self-pay | Admitting: Occupational Therapy

## 2018-09-29 ENCOUNTER — Other Ambulatory Visit: Payer: Self-pay

## 2018-09-29 DIAGNOSIS — M6281 Muscle weakness (generalized): Secondary | ICD-10-CM | POA: Diagnosis not present

## 2018-09-29 DIAGNOSIS — R278 Other lack of coordination: Secondary | ICD-10-CM | POA: Diagnosis not present

## 2018-09-29 NOTE — Therapy (Signed)
[place] [facility] MAIN [facility] [address] [place], [place], [address] Phone: [phone]   Fax:  [phone]  Occupational Therapy Treatment  Patient Details  Name: Jason CroakJames Nolan MRN: [id number] Date of Birth: 1946-02-04 No data recorded  Encounter Date: 09/29/2018  OT End of Session - 09/29/18 0940    Visit Number  116    Number of Visits  120    Date for OT Re-Evaluation  12/15/18    Authorization Type  Progress reporting period starting 07/02/2018    OT Start Time  0930    OT Stop Time  1015    OT Time Calculation (min)  45 min    Activity Tolerance  Patient tolerated treatment well    Behavior During Therapy  [facility] for tasks assessed/performed       Past Medical History:  Diagnosis Date  . Glaucoma   . Hepatitis C   . Stroke [facility](HCC)     Past Surgical History:  Procedure Laterality Date  . COLONOSCOPY WITH PROPOFOL N/A 07/01/2018   Procedure: COLONOSCOPY WITH PROPOFOL;  Surgeon: Wyline MoodAnna, Kiran, MD;  [facility];  Service: Gastroenterology;  Laterality: N/A;  . IR ANGIO INTRA EXTRACRAN SEL COM CAROTID INNOMINATE UNI L MOD SED  11/25/2016  . IR ANGIO VERTEBRAL SEL SUBCLAVIAN INNOMINATE UNI R MOD SED  11/25/2016  . IR ANGIO VERTEBRAL SEL VERTEBRAL UNI L MOD SED  11/25/2016  . IR INTRAVSC STENT CERV CAROTID W/O EMB-PROT MOD SED INC ANGIO  11/25/2016  . IR PERCUTANEOUS ART THROMBECTOMY/INFUSION INTRACRANIAL INC DIAG ANGIO  11/25/2016  . IR RADIOLOGIST EVAL & MGMT  02/13/2017  . RADIOLOGY WITH ANESTHESIA N/A 11/25/2016   Procedure: RADIOLOGY WITH ANESTHESIA;  Surgeon: Radiologist, Medication, MD;  Location: [facility] OR;  Service: Radiology;  Laterality: N/A;    There were no vitals filed for this visit.  Subjective Assessment - 09/29/18 0936    Subjective   Pt. reports having a restful weekend.    Patient is accompanied by:  Family member    Pertinent History  Pt. is a 73 y.o. 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 [facility]  [facility] He underwent CTA brain showed short segment near occlusion of proximal R-ICA with associated intraluminal thrombus --likely due to dissection and emergent large vessel occlusion with right M2 occlusion and evolving right frontal lobe infarct.  He underwent cerebral angio with  complete revascularization of occluded superior division of R-MCA and near complete revascularization of proximal R-ICA with stent assisted angioplasty.  Stroke felt to be embolic in setting of ruptured plaque R-ICA and he was placed on ASA and brillinta due to stent. Pt. was transferred to inpatient rehab for several weeks, had home health therapy upon discharge, and now is ready for outpatient therapy services.    Limitations  Dominant LUE functioning, left hand edema, distal ROM, coordination, and overal strength.    Patient Stated Goals  To regain the use of his LUE.    Currently in Pain?  No/denies       OT TREATMENT    Neuro muscular re-education:  Pt. worked on translatory movements of the hand. Pt. worked on manipulating 1/2" flat marbles, and small magnetic pegs. Pt. required cues for hand position.   Response to treatment:  Pt. presents with improving left hand stiffness today, and presents with less compensation proximally. Pt. is improving with thumb movements, and thumb opposition.  OT Education - 09/29/18 0940    Education provided  Yes    Education Details  [facility]     Person(s) Educated  Patient    Methods  Explanation;Demonstration;Verbal cues    Comprehension  Verbalized understanding;Returned demonstration          OT Long Term Goals - 09/22/18 0858      OT LONG TERM GOAL #6   Title  Pt. will improve left hand coordination skills to be able to button clothing.    Baseline  09/22/2018: Pt. has had a decrease in 9 hole peg test score. Pt. continues to present with limited left hand FMC, speed, and dexterity, Pt.  continues to have difficulty with buttoning.    Time  12    Period  Weeks    Status  On-going    Target Date  12/15/18      OT LONG TERM GOAL  #9   Baseline  Pt. will write one sentence with 75% legibility and efficiently while maintainingg grasp on pen 100% of the time.    Time  12    Period  Weeks    Status  On-going    Target Date  12/15/18      OT LONG TERM GOAL  #10   TITLE  Pt. will independently use his left hand to grasp and efficiently accomodate to the varying weights of ADL objects during self care tasks.     Baseline  09/22/2018: Pt. continues to work on grasping and adjusting his hand to the varying weights of objects.    Time  12    Period  Weeks    Status  On-going    Target Date  12/15/18      OT LONG TERM GOAL  #11   TITLE  Pt. will improve [facility] skills to be able to independently manipulate items for ADLs/IADLs when UEs are elevated when reaching into the closet, or microwave.    Baseline  09/22/2018: Pt. continues to have difficulty manipulating objects when UEs elevated     Time  12    Period  Weeks    Status  On-going    Target Date  12/15/18            Plan - 09/29/18 0941    Clinical Impression Statement  Pt. reports that he has decided to hold off on a project redoing a table since it has been raining. Pt. reports that he has been signing papers slowly. Pt. continues to present with limited  LUE strength, FMC, and translatory movements of the hand. Pt. continues to improve hand function in preparation for improved ADL, and IADL functioning, and writing.    Occupational performance deficits (Please refer to evaluation for details):  ADL's;IADL's    Rehab Potential  Excellent    Clinical Decision Making  Several treatment options, min-mod task modification necessary    OT Frequency  2x / week    OT Duration  12 weeks    OT Treatment/Interventions  Self-care/ADL training;Therapeutic exercise;Neuromuscular education;Patient/family education;Energy  conservation;Therapeutic activities;DME and/or AE instruction    Consulted and Agree with Plan of Care  Patient       Patient will benefit from skilled therapeutic intervention in order to improve the following deficits and impairments:     Visit Diagnosis: Muscle weakness (generalized)  Other lack of coordination    Problem List Patient Active Problem List   Diagnosis Date Noted  . Pure hypercholesterolemia 05/28/2017  . Erectile dysfunction 05/28/2017  .  Left hemiparesis (HCC)   . Essential hypertension 11/29/2016  . 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 Chronic hepatitis C without hepatic coma (HCC) 07/22/2013    Olegario Messier, MS, OTR/L 09/29/2018, 10:00 AM  [facility] MAIN [facility] SERVICES [address] [place], [place], [address] Phone: [phone]   Fax:  [phone]  Name: Bhavik Cabiness MRN: [id number] Date of Birth: 1945/08/31

## 2018-10-01 ENCOUNTER — Ambulatory Visit: Payer: Medicare Other | Admitting: Occupational Therapy

## 2018-10-01 ENCOUNTER — Other Ambulatory Visit: Payer: Self-pay

## 2018-10-01 ENCOUNTER — Encounter: Payer: Self-pay | Admitting: Occupational Therapy

## 2018-10-01 DIAGNOSIS — M6281 Muscle weakness (generalized): Secondary | ICD-10-CM

## 2018-10-01 DIAGNOSIS — R278 Other lack of coordination: Secondary | ICD-10-CM

## 2018-10-01 NOTE — Therapy (Signed)
[facility] MAIN [facility] SERVICES [address] [place], [place], [address] Phone: [phone]   Fax:  [phone]  Occupational Therapy Treatment  Patient Details  Name: Jason Nolan MRN: [id number] Date of Birth: [birth year] No data recorded  Encounter Date: 10/01/2018  OT End of Session - 10/01/18 0841    Visit Number  117    Number of Visits  120    Date for OT Re-Evaluation  12/15/18    OT Start Time  0830    OT Stop Time  0915    OT Time Calculation (min)  45 min    Activity Tolerance  Patient tolerated treatment well    Behavior During Therapy  [facility] for tasks assessed/performed       Past Medical History:  Diagnosis Date  . Glaucoma   . Hepatitis C   . Stroke [facility])     Past Surgical History:  Procedure Laterality Date  . COLONOSCOPY WITH PROPOFOL N/A 07/01/2018   Procedure: COLONOSCOPY WITH PROPOFOL;  Surgeon: Wyline Mood, MD;  Location[facility] ENDOSCOPY;  Service: Gastroenterology;  Laterality: N/A;  . IR ANGIO INTRA EXTRACRAN SEL COM CAROTID INNOMINATE UNI L MOD SED  11/25/2016  . IR ANGIO VERTEBRAL SEL SUBCLAVIAN INNOMINATE UNI R MOD SED  11/25/2016  . IR ANGIO VERTEBRAL SEL VERTEBRAL UNI L MOD SED  11/25/2016  . IR INTRAVSC STENT CERV CAROTID W/O EMB-PROT MOD SED INC ANGIO  11/25/2016  . IR PERCUTANEOUS ART THROMBECTOMY/INFUSION INTRACRANIAL INC DIAG ANGIO  11/25/2016  . IR RADIOLOGIST EVAL & MGMT  02/13/2017  . RADIOLOGY WITH ANESTHESIA N/A 11/25/2016   Procedure: RADIOLOGY WITH ANESTHESIA;  Surgeon: Radiologist, Medication, MD;  [facility]: [facility] OR;  Service: Radiology;  Laterality: N/A;    There were no vitals filed for this visit.  Subjective Assessment - 10/01/18 0836    Subjective   Pt. reports being tired of the Coronavirus.    Patient is accompanied by:  Family member    Pertinent History  Pt. is a 73 y.o. 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 Pt. was transferred to [facility]. He underwent CTA brain showed  short segment near occlusion of proximal R-ICA with associated intraluminal thrombus --likely due to dissection and emergent large vessel occlusion with right M2 occlusion and evolving right frontal lobe infarct.  He underwent cerebral angio with  complete revascularization of occluded superior division of R-MCA and near complete revascularization of proximal R-ICA with stent assisted angioplasty.  Stroke felt to be embolic in setting of ruptured plaque R-ICA and he was placed on ASA and brillinta due to stent. Pt. was transferred to inpatient rehab for several weeks, had home health therapy upon discharge, and now is ready for outpatient therapy services.    Limitations  Dominant LUE functioning, left hand edema, distal ROM, coordination, and overal strength.    Currently in Pain?  No/denies      OT TREATMENT    Neuro muscular re-education:  Pt. performed [facility] skills training to improve speed and dexterity needed for ADL tasks and writing. Pt. worked on grasping 1 inch sticks, Pt. performed grasping each stick with his 2nd digit and thumb, and storing them in the palm. Pt. worked on translatory movements of the hand. Pt. removed the pegs alternating thumb opposition to the tip of his 2nd digit to thumb. Pt. worked on writing tasks with emphasis placed on stabilizing the pen within his hand. Pt. presented with minimal deviation from the line at the very  end of the second sentence. Pt. presented with 90% legibility for printed form. Pt. presented with 75% legibility for cursive form with deviation upwards.  Response to treatment:  Pt. Responded well to treatment. Pt. presented with increased compensation proximally, and dropped multiple sticks from his palm today during translatory movements secondary to hand stiffness. Pt. was able to stabilize a standard pen more securely in his left hand throughout the duration of the writing task.                          OT Education -  10/01/18 0841    Education provided  Yes    Education Details  [facility]     Person(s) Educated  Patient    Methods  Explanation;Demonstration;Verbal cues    Comprehension  Verbalized understanding;Returned demonstration          OT Long Term Goals - 09/22/18 0858      OT LONG TERM GOAL #6   Title  Pt. will improve left hand coordination skills to be able to button clothing.    Baseline  09/22/2018: Pt. has had a decrease in 9 hole peg test score. Pt. continues to present with limited left hand FMC, speed, and dexterity, Pt. continues to have difficulty with buttoning.    Time  12    Period  Weeks    Status  On-going    Target Date  12/15/18      OT LONG TERM GOAL  #9   Baseline  Pt. will write one sentence with 75% legibility and efficiently while maintainingg grasp on pen 100% of the time.    Time  12    Period  Weeks    Status  On-going    Target Date  12/15/18      OT LONG TERM GOAL  #10   TITLE  Pt. will independently use his left hand to grasp and efficiently accomodate to the varying weights of ADL objects during self care tasks.     Baseline  09/22/2018: Pt. continues to work on grasping and adjusting his hand to the varying weights of objects.    Time  12    Period  Weeks    Status  On-going    Target Date  12/15/18      OT LONG TERM GOAL  #11   TITLE  Pt. will improve [facility] skills to be able to independently manipulate items for ADLs/IADLs when UEs are elevated when reaching into the closet, or microwave.    Baseline  09/22/2018: Pt. continues to have difficulty manipulating objects when UEs elevated     Time  12    Period  Weeks    Status  On-going    Target Date  12/15/18            Plan - 10/01/18 0841    Clinical Impression Statement Pt. presents with increased stiffness secondary to edema in the right hand. Pt. reports that he may have overworked his hand while exercising last night. Pt. continues to present with limited right hnad grip strength, pinch  strength and [facility] skills. Pt. continues to work on improving motor control, and [facility] ADL, and IADL functioning.     Occupational performance deficits (Please refer to evaluation for details):  ADL's;IADL's    Rehab Potential  Excellent    Clinical Decision Making  Several treatment options, min-mod task modification necessary    OT Frequency  2x / week    OT  Duration  12 weeks    OT Treatment/Interventions  Self-care/ADL training;Therapeutic exercise;Neuromuscular education;Patient/family education;Energy conservation;Therapeutic activities;DME and/or AE instruction    Consulted and Agree with Plan of Care  Patient       Patient will benefit from skilled therapeutic intervention in order to improve the following deficits and impairments:     Visit Diagnosis: Muscle weakness (generalized)  Other lack of coordination    Problem List Patient Active Problem List   Diagnosis Date Noted  . Pure hypercholesterolemia 05/28/2017  . Erectile dysfunction 05/28/2017  . Left hemiparesis (HCC)   . Essential hypertension 11/29/2016  . 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 Chronic hepatitis C without hepatic coma (HCC) 07/22/2013    Olegario MessierElaine Anuradha Chabot, MS, OTR/L 10/01/2018, 9:03 AM  [place] [facility] [address] [place], [place], [address] Phone: [phone]   Fax:  [phone]  Name: Jason Nolan MRN: [id number] Date of Birth: [birth year]

## 2018-10-05 ENCOUNTER — Ambulatory Visit: Payer: Medicare Other | Attending: Internal Medicine | Admitting: Occupational Therapy

## 2018-10-05 ENCOUNTER — Other Ambulatory Visit: Payer: Self-pay

## 2018-10-05 ENCOUNTER — Encounter: Payer: Self-pay | Admitting: Occupational Therapy

## 2018-10-05 DIAGNOSIS — M6281 Muscle weakness (generalized): Secondary | ICD-10-CM | POA: Diagnosis not present

## 2018-10-05 DIAGNOSIS — R278 Other lack of coordination: Secondary | ICD-10-CM | POA: Insufficient documentation

## 2018-10-05 NOTE — Therapy (Signed)
[facility] MAIN [facility] SERVICES [address] [place], [place], [address] Phone: [phone]   Fax:  [phone]  Occupational Therapy Treatment  Patient Details  Name: Jason Nolan MRN: [id number] Date of Birth: 1945/11/21 No data recorded  Encounter Date: 10/05/2018  OT End of Session - 10/05/18 0938    Visit Number  118    Number of Visits  120    Date for OT Re-Evaluation  12/15/18    Authorization Type  Progress reporting period starting 07/02/2018    OT Start Time  0927    OT Stop Time  1015    OT Time Calculation (min)  48 min    Activity Tolerance  Patient tolerated treatment well    Behavior During Therapy  [facility] for tasks assessed/performed       Past Medical History:  Diagnosis Date  . Glaucoma   . Hepatitis C   . Stroke [facility])     Past Surgical History:  Procedure Laterality Date  . COLONOSCOPY WITH PROPOFOL N/A 07/01/2018   Procedure: COLONOSCOPY WITH PROPOFOL;  Surgeon: Wyline Mood, MD;  Location: [facility] ENDOSCOPY;  Service: Gastroenterology;  Laterality: N/A;  . IR ANGIO INTRA EXTRACRAN SEL COM CAROTID INNOMINATE UNI L MOD SED  11/25/2016  . IR ANGIO VERTEBRAL SEL SUBCLAVIAN INNOMINATE UNI R MOD SED  11/25/2016  . IR ANGIO VERTEBRAL SEL VERTEBRAL UNI L MOD SED  11/25/2016  . IR INTRAVSC STENT CERV CAROTID W/O EMB-PROT MOD SED INC ANGIO  11/25/2016  . IR PERCUTANEOUS ART THROMBECTOMY/INFUSION INTRACRANIAL INC DIAG ANGIO  11/25/2016  . IR RADIOLOGIST EVAL & MGMT  02/13/2017  . RADIOLOGY WITH ANESTHESIA N/A 11/25/2016   Procedure: RADIOLOGY WITH ANESTHESIA;  Surgeon: Radiologist, Medication, MD;  [facility] OR;  Service: Radiology;  Laterality: N/A;    There were no vitals filed for this visit.  Subjective Assessment - 10/05/18 0935    Subjective   Pt. reports he had a restful weekend.    Patient is accompanied by:  Family member    Pertinent History  Pt. is a 73 y.o. 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 Pt.  was transferred to [facility]. He underwent CTA brain showed short segment near occlusion of proximal R-ICA with associated intraluminal thrombus --likely due to dissection and emergent large vessel occlusion with right M2 occlusion and evolving right frontal lobe infarct.  He underwent cerebral angio with  complete revascularization of occluded superior division of R-MCA and near complete revascularization of proximal R-ICA with stent assisted angioplasty.  Stroke felt to be embolic in setting of ruptured plaque R-ICA and he was placed on ASA and brillinta due to stent. Pt. was transferred to inpatient rehab for several weeks, had home health therapy upon discharge, and now is ready for outpatient therapy services.    Limitations  Dominant LUE functioning, left hand edema, distal ROM, coordination, and overal strength.    Currently in Pain?  No/denies      OT TREATMENT    Neuro muscular re-education:  Pt. worked on grasping coins from a tabletop surface, performing translatory movements moving the the coins through his hand to the tip of his 2nd digit and thumb, and  placing them into a resistive container,  pushing them through the slot while isolating his 2nd digit. Pt. Worked on [facility] skills with 1/4" small pegs. Pt. worked on translatory movements, and placing the pegs into a pegboard.  Response to Treatment:   Pt. Dropped 3 small coins  from the palm of his hand. Pt. presented with increased compensation with LUE abducting, as well as  through his trunk with laterally leaning to the right when manipulating pegs, and placing them into the pegboard.                      OT Education - 10/05/18 [phone]    Education provided  Yes    Education Details  [facility]     Person(s) Educated  Patient    Methods  Explanation;Demonstration;Verbal cues    Comprehension  Verbalized understanding;Returned demonstration          OT Long Term Goals - 09/22/18 0858      OT LONG TERM GOAL #6    Title  Pt. will improve left hand coordination skills to be able to button clothing.    Baseline  09/22/2018: Pt. has had a decrease in 9 hole peg test score. Pt. continues to present with limited left hand FMC, speed, and dexterity, Pt. continues to have difficulty with buttoning.    Time  12    Period  Weeks    Status  On-going    Target Date  12/15/18      OT LONG TERM GOAL  #9   Baseline  Pt. will write one sentence with 75% legibility and efficiently while maintainingg grasp on pen 100% of the time.    Time  12    Period  Weeks    Status  On-going    Target Date  12/15/18      OT LONG TERM GOAL  #10   TITLE  Pt. will independently use his left hand to grasp and efficiently accomodate to the varying weights of ADL objects during self care tasks.     Baseline  09/22/2018: Pt. continues to work on grasping and adjusting his hand to the varying weights of objects.    Time  12    Period  Weeks    Status  On-going    Target Date  12/15/18      OT LONG TERM GOAL  #11   TITLE  Pt. will improve [facility] skills to be able to independently manipulate items for ADLs/IADLs when UEs are elevated when reaching into the closet, or microwave.    Baseline  09/22/2018: Pt. continues to have difficulty manipulating objects when UEs elevated     Time  12    Period  Weeks    Status  On-going    Target Date  12/15/18            Plan - 10/05/18 0939    Clinical Impression Statement  Pt. is improving with Left UE strength, and overall FMC skills. Pt. continues to present with limited translatory movements of the left hand. Pt. continues to work on improving LUE strength, and [facility] skills in order to improve hand function to be able to write, and type efficiently.     Occupational performance deficits (Please refer to evaluation for details):  ADL's;IADL's    Rehab Potential  Excellent    Clinical Decision Making  Several treatment options, min-mod task modification necessary    OT Frequency  2x / week     OT Treatment/Interventions  Self-care/ADL training;Therapeutic exercise;Neuromuscular education;Patient/family education;Energy conservation;Therapeutic activities;DME and/or AE instruction    Consulted and Agree with Plan of Care  Patient       Patient will benefit from skilled therapeutic intervention in order to improve the following deficits and impairments:  Visit Diagnosis: Muscle weakness (generalized)  Other lack of coordination    Problem List Patient Active Problem List   Diagnosis Date Noted  . Pure hypercholesterolemia 05/28/2017  . Erectile dysfunction 05/28/2017  . Left hemiparesis (HCC)   . Essential hypertension 11/29/2016  . 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 Chronic hepatitis C without hepatic coma (HCC) 07/22/2013    Olegario Messier, MS, OTR/L 10/05/2018, 10:08 AM  [place] [facility] MAIN [facility] SERVICES [address] [place], [place], [address] Phone: [phone]   Fax:  [phone]  Name: Kru Allman MRN: [id number] Date of Birth: 1945-10-25

## 2018-10-07 ENCOUNTER — Other Ambulatory Visit: Payer: Self-pay

## 2018-10-07 ENCOUNTER — Encounter: Payer: Self-pay | Admitting: Occupational Therapy

## 2018-10-07 ENCOUNTER — Ambulatory Visit: Payer: Medicare Other | Admitting: Occupational Therapy

## 2018-10-07 DIAGNOSIS — M6281 Muscle weakness (generalized): Secondary | ICD-10-CM | POA: Diagnosis not present

## 2018-10-07 DIAGNOSIS — R278 Other lack of coordination: Secondary | ICD-10-CM | POA: Diagnosis not present

## 2018-10-07 NOTE — Therapy (Signed)
[place] [facility] MAIN [facility] SERVICES [address] [place], [place], [address] Phone: [phone]   Fax:  [phone]  Occupational Therapy Treatment  Patient Details  Name: Jason Nolan MRN: [id number] Date of Birth: 1945-07-27 No data recorded  Encounter Date: 10/07/2018  OT End of Session - 10/07/18 0941    Visit Number  119    Number of Visits  120    Date for OT Re-Evaluation  12/15/18    Authorization Type  Progress reporting period starting 07/02/2018    OT Start Time  0931    OT Stop Time  1015    OT Time Calculation (min)  44 min    Activity Tolerance  Patient tolerated treatment well    Behavior During Therapy  [facility] for tasks assessed/performed       Past Medical History:  Diagnosis Date  . Glaucoma   . Hepatitis C   . Stroke [facility])     Past Surgical History:  Procedure Laterality Date  . COLONOSCOPY WITH PROPOFOL N/A 07/01/2018   Procedure: COLONOSCOPY WITH PROPOFOL;  Surgeon: Wyline Mood, MD;  Location: [facility] ENDOSCOPY;  Service: Gastroenterology;  Laterality: N/A;  . IR ANGIO INTRA EXTRACRAN SEL COM CAROTID INNOMINATE UNI L MOD SED  11/25/2016  . IR ANGIO VERTEBRAL SEL SUBCLAVIAN INNOMINATE UNI R MOD SED  11/25/2016  . IR ANGIO VERTEBRAL SEL VERTEBRAL UNI L MOD SED  11/25/2016  . IR INTRAVSC STENT CERV CAROTID W/O EMB-PROT MOD SED INC ANGIO  11/25/2016  . IR PERCUTANEOUS ART THROMBECTOMY/INFUSION INTRACRANIAL INC DIAG ANGIO  11/25/2016  . IR RADIOLOGIST EVAL & MGMT  02/13/2017  . RADIOLOGY WITH ANESTHESIA N/A 11/25/2016   Procedure: RADIOLOGY WITH ANESTHESIA;  Surgeon: Radiologist, Medication, MD;  Location: [facility] OR;  Service: Radiology;  Laterality: N/A;    There were no vitals filed for this visit.  Subjective Assessment - 10/07/18 0939    Subjective   Pt. plans to try to play golf today.     Patient is accompanied by:  Family member    Pertinent History  Pt. is a 73 y.o. 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 Pt.  was transferred to [facility]. He underwent CTA brain showed short segment near occlusion of proximal R-ICA with associated intraluminal thrombus --likely due to dissection and emergent large vessel occlusion with right M2 occlusion and evolving right frontal lobe infarct.  He underwent cerebral angio with  complete revascularization of occluded superior division of R-MCA and near complete revascularization of proximal R-ICA with stent assisted angioplasty.  Stroke felt to be embolic in setting of ruptured plaque R-ICA and he was placed on ASA and brillinta due to stent. Pt. was transferred to inpatient rehab for several weeks, had home health therapy upon discharge, and now is ready for outpatient therapy services.    Limitations  Dominant LUE functioning, left hand edema, distal ROM, coordination, and overal strength.    Currently in Pain?  No/denies      OT TREATMENT    Neuro muscular re-education:  Pt. worked on grasping coins from a tabletop surface, placing them into a resistive container, and pushing them through the slot while isolating his 2nd digit. Pt. worked on moving the flat coins with his 2nd digit of the edge of a raised surface to his thumb, and stored them in the palm of his hand. Pt. worked on translatory movements of the hand moving the coins through his palm to the tip of his 2nd digit,  and thumb, and placing them through a resistive container. Pt. Worked on progressing hand function, and translatory movements of the hand with smaller 1/4" [employer] washers.  Response to treatment:  Pt. with less compensation proximally during the task. Pt. dropped several coins as the task progressed, and pt. Fatigued with the coins. Pt. had more difficulty with the washers.                           OT Education - 10/07/18 0941    Education provided  Yes    Education Details  [facility]     Person(s) Educated  Patient    Methods  Explanation;Demonstration;Verbal cues     Comprehension  Verbalized understanding;Returned demonstration          OT Long Term Goals - 09/22/18 0858      OT LONG TERM GOAL #6   Title  Pt. will improve left hand coordination skills to be able to button clothing.    Baseline  09/22/2018: Pt. has had a decrease in 9 hole peg test score. Pt. continues to present with limited left hand FMC, speed, and dexterity, Pt. continues to have difficulty with buttoning.    Time  12    Period  Weeks    Status  On-going    Target Date  12/15/18      OT LONG TERM GOAL  #9   Baseline  Pt. will write one sentence with 75% legibility and efficiently while maintainingg grasp on pen 100% of the time.    Time  12    Period  Weeks    Status  On-going    Target Date  12/15/18      OT LONG TERM GOAL  #10   TITLE  Pt. will independently use his left hand to grasp and efficiently accomodate to the varying weights of ADL objects during self care tasks.     Baseline  09/22/2018: Pt. continues to work on grasping and adjusting his hand to the varying weights of objects.    Time  12    Period  Weeks    Status  On-going    Target Date  12/15/18      OT LONG TERM GOAL  #11   TITLE  Pt. will improve [facility] skills to be able to independently manipulate items for ADLs/IADLs when UEs are elevated when reaching into the closet, or microwave.    Baseline  09/22/2018: Pt. continues to have difficulty manipulating objects when UEs elevated     Time  12    Period  Weeks    Status  On-going    Target Date  12/15/18            Plan - 10/07/18 0942    Clinical Impression Statement  Pt. continues to make overall progress with his LUE strength, and [facility] skills. Pt. has increased engagement of the Left hand during ADL, and IADL tasks at home. Pt. is able to use his left hand to open lids on cans, wash windows, and vacuum. Pt. continues to present with limited translatory movements of the hand. Pt. continues to work on improving hand function skills in order to  improve ADL, and IADL functioning.       Occupational performance deficits (Please refer to evaluation for details):  ADL's;IADL's    Rehab Potential  Excellent    Clinical Decision Making  Several treatment options, min-mod task modification necessary    OT Frequency  2x / week    OT Duration  12 weeks    OT Treatment/Interventions  Self-care/ADL training;Therapeutic exercise;Neuromuscular education;Patient/family education;Energy conservation;Therapeutic activities;DME and/or AE instruction    Consulted and Agree with Plan of Care  Patient       Patient will benefit from skilled therapeutic intervention in order to improve the following deficits and impairments:     Visit Diagnosis: Muscle weakness (generalized)  Other lack of coordination    Problem List Patient Active Problem List   Diagnosis Date Noted  . Pure hypercholesterolemia 05/28/2017  . Erectile dysfunction 05/28/2017  . Left hemiparesis (HCC)   . Essential hypertension 11/29/2016  . 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 Chronic hepatitis C without hepatic coma (HCC) 07/22/2013    Olegario Messier, MS, OTR/L 10/07/2018, 9:56 AM  [facility] MAIN [facility] SERVICES [address] [place], [place], [address] Phone: [phone]   Fax:  [phone]  Name: Brison Fiumara MRN: [id number] Date of Birth: 1945/06/15

## 2018-10-12 ENCOUNTER — Encounter: Payer: Self-pay | Admitting: Occupational Therapy

## 2018-10-12 ENCOUNTER — Ambulatory Visit: Payer: Medicare Other | Admitting: Occupational Therapy

## 2018-10-12 ENCOUNTER — Other Ambulatory Visit: Payer: Self-pay

## 2018-10-12 DIAGNOSIS — R278 Other lack of coordination: Secondary | ICD-10-CM | POA: Diagnosis not present

## 2018-10-12 DIAGNOSIS — M6281 Muscle weakness (generalized): Secondary | ICD-10-CM

## 2018-10-12 NOTE — Therapy (Signed)
[facility] MAIN [facility] [address] [place], [place], [address] Phone: [phone]   Fax:  [phone]  Occupational Therapy Progress Note  Dates of reporting period  07/02/2018  to   10/12/2018  Patient Details  Name: Jason Nolan MRN: [id number] Date of Birth: 1945-12-05 No data recorded  Encounter Date: 10/12/2018  OT End of Session - 10/12/18 0840    Visit Number  120    Number of Visits  120    Date for OT Re-Evaluation  12/15/18    Authorization Type  Progress reporting period starting 07/02/2018    OT Start Time  0930    OT Stop Time  1015    OT Time Calculation (min)  45 min    Activity Tolerance  Patient tolerated treatment well    Behavior During Therapy  [facility] for tasks assessed/performed       Past Medical History:  Diagnosis Date  . Glaucoma   . Hepatitis C   . Stroke [facility](HCC)     Past Surgical History:  Procedure Laterality Date  . COLONOSCOPY WITH PROPOFOL N/A 07/01/2018   Procedure: COLONOSCOPY WITH PROPOFOL;  Surgeon: Wyline MoodAnna, Kiran, MD;  [facility];  Service: Gastroenterology;  Laterality: N/A;  . IR ANGIO INTRA EXTRACRAN SEL COM CAROTID INNOMINATE UNI L MOD SED  11/25/2016  . IR ANGIO VERTEBRAL SEL SUBCLAVIAN INNOMINATE UNI R MOD SED  11/25/2016  . IR ANGIO VERTEBRAL SEL VERTEBRAL UNI L MOD SED  11/25/2016  . IR INTRAVSC STENT CERV CAROTID W/O EMB-PROT MOD SED INC ANGIO  11/25/2016  . IR PERCUTANEOUS ART THROMBECTOMY/INFUSION INTRACRANIAL INC DIAG ANGIO  11/25/2016  . IR RADIOLOGIST EVAL & MGMT  02/13/2017  . RADIOLOGY WITH ANESTHESIA N/A 11/25/2016   Procedure: RADIOLOGY WITH ANESTHESIA;  Surgeon: Radiologist, Medication, MD;  [facility] OR;  Service: Radiology;  Laterality: N/A;    There were no vitals filed for this visit.  Subjective Assessment - 10/12/18 0838    Subjective   Pt. woked on staining a table this weakness.    Patient is accompanied by:  Family member    Pertinent History  Pt. is a  73 y.o. 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 [facility] He underwent CTA brain showed short segment near occlusion of proximal R-ICA with associated intraluminal thrombus --likely due to dissection and emergent large vessel occlusion with right M2 occlusion and evolving right frontal lobe infarct.  He underwent cerebral angio with  complete revascularization of occluded superior division of R-MCA and near complete revascularization of proximal R-ICA with stent assisted angioplasty.  Stroke felt to be embolic in setting of ruptured plaque R-ICA and he was placed on ASA and brillinta due to stent. Pt. was transferred to inpatient rehab for several weeks, had home health therapy upon discharge, and now is ready for outpatient therapy services.    Limitations  Dominant LUE functioning, left hand edema, distal ROM, coordination, and overal strength.    Currently in Pain?  No/denies      OT TREATMENT    Neuro muscular re-education:  Pt. worked on [facility], hand function skills, and translatory movements of the left hand. Pt. presents with improved thumb opposition. However continues to work on improving the fluidity of translatory movements of the hand. Pt. Worked on I 1/4" small pegs one at a time alternating thumb opposition to the tip of the 2nd digit, and thumb.Pt. requires increased time to complete.  Response to  treatment:  Pt. Presented with Decreased compensation proximally in the LUE, however hand increased leaning to the right.                           OT Education - 10/12/18 [phone]    Education provided  Yes    Education Details  [facility]     Person(s) Educated  Patient    Methods  Explanation;Demonstration;Verbal cues    Comprehension  Verbalized understanding;Returned demonstration          OT Long Term Goals - 10/12/18 0855      OT LONG TERM GOAL #6   Title  Pt. will improve left hand coordination skills to be able to button  clothing.    Baseline  Pt. continues to present with limited LUE strength, and FMC.    Time  12    Period  Weeks    Status  On-going    Target Date  12/15/18      OT LONG TERM GOAL  #9   Baseline  Pt. will write one sentence with 75% legibility and efficiently while maintainingg grasp on pen 100% of the time.    Time  12    Period  Weeks    Status  On-going    Target Date  12/15/18      OT LONG TERM GOAL  #10   TITLE  Pt. will independently use his left hand to grasp and efficiently accomodate to the varying weights of ADL objects during self care tasks.     Baseline  Pt. continues to work on grasping and adjusting his hand to the varying weights of objects.    Time  12    Period  Weeks    Target Date  12/15/18      OT LONG TERM GOAL  #11   TITLE  Pt. will improve [facility] skills to be able to independently manipulate items for ADLs/IADLs when UEs are elevated when reaching into the closet, or microwave.    Baseline  Pt. continues to have difficulty manipulating objects when UEs elevated     Time  12    Period  Weeks    Status  On-going    Target Date  12/15/18            Plan - 10/12/18 0841    Clinical Impression Statement  Pt. is making progress overall. Pt. is now able to use his LUE to open cans, and slice food with a knife. Pt. has difficulty maintaining a proper hold on the knife while cutting. Pt. continues to work on improving LUE strength, hand function, and [facility] skills. Pt. continues to work on improving LUE strength, and [facility] skills in order to improve ADL, and IADL functioning.    Occupational performance deficits (Please refer to evaluation for details):  ADL's;IADL's    Rehab Potential  Excellent    Clinical Decision Making  Several treatment options, min-mod task modification necessary    OT Frequency  2x / week    OT Duration  12 weeks    OT Treatment/Interventions  Self-care/ADL training;Therapeutic exercise;Neuromuscular education;Patient/family education;Energy  conservation;Therapeutic activities;DME and/or AE instruction    Consulted and Agree with Plan of Care  Patient       Patient will benefit from skilled therapeutic intervention in order to improve the following deficits and impairments:     Visit Diagnosis: Muscle weakness (generalized)  Other lack of coordination    Problem List Patient  Active Problem List   Diagnosis Date Noted  . Pure hypercholesterolemia 05/28/2017  . Erectile dysfunction 05/28/2017  . Left hemiparesis ([place])   . Essential hypertension 11/29/2016  . Acute ischemic stroke ([place]) - R MCA embolic stroke in setting of ruptured R ICA plaque w dissection, s/p R MCA stent and thrombectomy 11/25/2016  . Chronic hepatitis C without hepatic coma ([place]) 07/22/2013    Harrel Carina, MS, OTR/L 10/12/2018, 9:18 AM  [place] MAIN [facility] SERVICES [address] [place], [place], [address] Phone: [phone]   Fax:  [phone]  Name: Jason Nolan MRN: [id number] Date of Birth: 1946-03-28

## 2018-10-13 ENCOUNTER — Other Ambulatory Visit: Payer: Self-pay

## 2018-10-13 ENCOUNTER — Encounter: Payer: Self-pay | Admitting: Physical Medicine & Rehabilitation

## 2018-10-13 ENCOUNTER — Ambulatory Visit: Payer: Medicare Other

## 2018-10-13 ENCOUNTER — Encounter: Payer: Medicare Other | Attending: Physical Medicine & Rehabilitation | Admitting: Physical Medicine & Rehabilitation

## 2018-10-13 VITALS — BP 129/66 | HR 49 | Ht 70.0 in | Wt 200.0 lb

## 2018-10-13 DIAGNOSIS — I69322 Dysarthria following cerebral infarction: Secondary | ICD-10-CM | POA: Diagnosis not present

## 2018-10-13 DIAGNOSIS — I639 Cerebral infarction, unspecified: Secondary | ICD-10-CM | POA: Diagnosis not present

## 2018-10-13 DIAGNOSIS — G8194 Hemiplegia, unspecified affecting left nondominant side: Secondary | ICD-10-CM | POA: Diagnosis not present

## 2018-10-13 NOTE — Progress Notes (Signed)
Subjective:    Patient ID: Jason Nolan, male    DOB: 07-25-1945, 73 y.o.   MRN: [id number] 73y.o.left handed male with history of Hep C-treated with Harvoni, glaucoma who was admitted via [facility] on 7/23 with left sided weakness, facial droop and slurred speech due to early changes of R-MCA territory infarct. He underwent CTA brain showed short segment near occlusion of proximal R-ICA with associated intraluminal thrombus --likely due to dissection and emergent large vessel occlusion with right M2 occlusion and evolving right frontal lobe infarct. He underwent cerebral angio with complete revascularization of occluded superior division of R-MCA and near complete revascularization of proximal R-ICA with stent assisted angioplasty. Stroke felt to be embolic in setting of ruptured plaque R-ICA and he was placedon ASA and brillinta due to stent. Patient with resultant left sided weakness, expressive deficits with aphonia and dysphagia  HPI Neck feels ok, may have some cricks related to sleeping in the wrong position.  No falls  Not going to gym but walking and trying to play golf Used treadmill, elliptical, machine  Does some floor exercise at home  Pain Inventory Average Pain 0 Pain Right Now 0 My pain is no pain  In the last 24 hours, has pain interfered with the following? General activity 0 Relation with others 0 Enjoyment of life 0 What TIME of day is your pain at its worst? no pain Sleep (in general) Fair  Pain is worse with: no pain Pain improves with: no pain Relief from Meds: no pain  Mobility walk without assistance how many minutes can you walk? 120 ability to climb steps?  yes do you drive?  yes transfers alone Do you have any goals in this area?  no  Function retired Do you have any goals in this area?  no  Neuro/Psych No problems in this area  Prior Studies Any changes since last visit?  no  Physicians involved in your care Any changes since last  visit?  no   Family History  Problem Relation Age of Onset  . Hypertension Mother   . Hypertension Father    Social History   Socioeconomic History  . Marital status: Married    Spouse name: Not on file  . Number of children: Not on file  . Years of education: Not on file  . Highest education level: Not on file  Occupational History  . Not on file  Social Needs  . Financial resource strain: Not on file  . Food insecurity:    Worry: Not on file    Inability: Not on file  . Transportation needs:    Medical: Not on file    Non-medical: Not on file  Tobacco Use  . Smoking status: Former Smoker    Packs/day: 0.50    Years: 15.00    Pack years: 7.50    Last attempt to quit: 1990    Years since quitting: 30.4  . Smokeless tobacco: Never Used  Substance and Sexual Activity  . Alcohol use: No    Alcohol/week: 0.0 standard drinks  . Drug use: No  . Sexual activity: Yes    Partners: Female    Birth control/protection: None  Lifestyle  . Physical activity:    Days per week: Not on file    Minutes per session: Not on file  . Stress: Not on file  Relationships  . Social connections:    Talks on phone: Not on file    Gets together: Not on file  Attends religious service: Not on file    Active member of club or organization: Not on file    Attends meetings of clubs or organizations: Not on file    Relationship status: Not on file  Other Topics Concern  . Not on file  Social History Narrative  . Not on file   Past Surgical History:  Procedure Laterality Date  . COLONOSCOPY WITH PROPOFOL N/A 07/01/2018   Procedure: COLONOSCOPY WITH PROPOFOL;  Surgeon: Wyline MoodAnna, Kiran, MD;  [facility];  Service: Gastroenterology;  Laterality: N/A;  . IR ANGIO INTRA EXTRACRAN SEL COM CAROTID INNOMINATE UNI L MOD SED  11/25/2016  . IR ANGIO VERTEBRAL SEL SUBCLAVIAN INNOMINATE UNI R MOD SED  11/25/2016  . IR ANGIO VERTEBRAL SEL VERTEBRAL UNI L MOD SED  11/25/2016  . IR INTRAVSC  STENT CERV CAROTID W/O EMB-PROT MOD SED INC ANGIO  11/25/2016  . IR PERCUTANEOUS ART THROMBECTOMY/INFUSION INTRACRANIAL INC DIAG ANGIO  11/25/2016  . IR RADIOLOGIST EVAL & MGMT  02/13/2017  . RADIOLOGY WITH ANESTHESIA N/A 11/25/2016   Procedure: RADIOLOGY WITH ANESTHESIA;  Surgeon: Radiologist, Medication, MD;  Location: [facility];  Service: Radiology;  Laterality: N/A;   Past Medical History:  Diagnosis Date  . Glaucoma   . Hepatitis C   . Stroke (HCC)    BP 129/66   Pulse (!) 49   Ht 5\' 10"  (1.778 m)   Wt 200 lb (90.7 kg)   SpO2 98%   BMI 28.70 kg/m   Opioid Risk Score:   Fall Risk Score:  `1  Depression screen PHQ 2/9  [facility] 07/03/2018 12/11/2017 09/09/2017 05/28/2017 04/08/2017  Decreased Interest 0 0 0 0 0  Down, Depressed, Hopeless 0 0 0 0 0  PHQ - 2 Score 0 0 0 0 0  Altered sleeping 0 - - - -  Tired, decreased energy 0 - - - -  Feeling bad or failure about yourself  0 - - - -  Trouble concentrating 0 - - - -  Moving slowly or fidgety/restless 0 - - - -  Suicidal thoughts 0 - - - -  PHQ-9 Score 0 - - - -  Some recent data might be hidden    Review of Systems  Constitutional: Negative.   Eyes: Negative.   Respiratory: Negative.   Cardiovascular: Negative.   Gastrointestinal: Negative.   Endocrine: Negative.   Genitourinary: Negative.   Musculoskeletal: Negative.   Skin: Negative.   Allergic/Immunologic: Negative.   Neurological: Negative.   Hematological: Negative.   Psychiatric/Behavioral: Negative.   All other systems reviewed and are negative.      Objective:   Physical Exam Vitals signs and nursing note reviewed.  Constitutional:      Appearance: Normal appearance.  HENT:     Head: Normocephalic and atraumatic.  Neck:     Musculoskeletal: Normal range of motion.  Skin:    General: Skin is warm and dry.  Neurological:     General: No focal deficit present.     Mental Status: He is alert and oriented to person, place, and time.   Psychiatric:        Mood and Affect: Mood normal.        Behavior: Behavior normal.   5/5 in the right deltoid bicep tricep grip hip flexion knee extension ankle dorsiflexion 4/5 in left deltoid bicep tricep grip hip flexor knee extensor ankle dorsiflexor Gait with stiff leg gait left lower extremity no evidence of toe drag or knee  instability.  Tone with hyperactive reflexes on the left side but no evidence of clonus        Assessment & Plan:  1.  History of right M2 branch distribution infarct with chronic mild left hemiparesis he has achieved modified independent level of functioning.  He is in a community exercise program which I think is appropriate.  He does not require any formalized physical therapy at this point. He will follow-up with primary care physician Physical medicine follow-up on as-needed basis.

## 2018-10-13 NOTE — Patient Instructions (Signed)
Please call if new problems or questions arise

## 2018-10-14 ENCOUNTER — Ambulatory Visit: Payer: Medicare Other | Admitting: Occupational Therapy

## 2018-10-14 ENCOUNTER — Encounter: Payer: Self-pay | Admitting: Occupational Therapy

## 2018-10-14 DIAGNOSIS — R278 Other lack of coordination: Secondary | ICD-10-CM | POA: Diagnosis not present

## 2018-10-14 DIAGNOSIS — M6281 Muscle weakness (generalized): Secondary | ICD-10-CM

## 2018-10-14 NOTE — Therapy (Signed)
[facility]  [facility] [address] [place], [place], [address] Phone: [phone]   Fax:  [phone]  Occupational Therapy Treatment  Patient Details  Name: Jason CroakJames Nolan MRN: [id number] Date of Birth: [birth year] No data recorded  Encounter Date: 10/14/2018  OT End of Session - 10/14/18 0838    Visit Number  121    Number of Visits  144    Date for OT Re-Evaluation  12/15/18    Authorization Type  Progress reporting period starting 07/02/2018    OT Start Time  0830    OT Stop Time  0915    OT Time Calculation (min)  45 min    Activity Tolerance  Patient tolerated treatment well    Behavior During Therapy  [facility] for tasks assessed/performed       Past Medical History:  Diagnosis Date  . Glaucoma   . Hepatitis C   . Stroke [facility](HCC)     Past Surgical History:  Procedure Laterality Date  . COLONOSCOPY WITH PROPOFOL N/A 07/01/2018   Procedure: COLONOSCOPY WITH PROPOFOL;  Surgeon: Wyline MoodAnna, Kiran, MD;  [facility];  Service: Gastroenterology;  Laterality: N/A;  . IR ANGIO INTRA EXTRACRAN SEL COM CAROTID INNOMINATE UNI L MOD SED  11/25/2016  . IR ANGIO VERTEBRAL SEL SUBCLAVIAN INNOMINATE UNI R MOD SED  11/25/2016  . IR ANGIO VERTEBRAL SEL VERTEBRAL UNI L MOD SED  11/25/2016  . IR INTRAVSC STENT CERV CAROTID W/O EMB-PROT MOD SED INC ANGIO  11/25/2016  . IR PERCUTANEOUS ART THROMBECTOMY/INFUSION INTRACRANIAL INC DIAG ANGIO  11/25/2016  . IR RADIOLOGIST EVAL & MGMT  02/13/2017  . RADIOLOGY WITH ANESTHESIA N/A 11/25/2016   Procedure: RADIOLOGY WITH ANESTHESIA;  Surgeon: Radiologist, Medication, MD;  Location: [facility] OR;  Service: Radiology;  Laterality: N/A;    There were no vitals filed for this visit.  Subjective Assessment - 10/14/18 0837    Subjective   Pt. is waiting to finish his painting project secondary to the humidity.    Patient is accompanied by:  Family member    Pertinent History  Pt. is a 73 y.o. male who sustained a  CVA with Left sided hemiparesis. [facility] He underwent CTA brain showed short segment near occlusion of proximal R-ICA with associated intraluminal thrombus --likely due to dissection and emergent large vessel occlusion with right M2 occlusion and evolving right frontal lobe infarct.  He underwent cerebral angio with  complete revascularization of occluded superior division of R-MCA and near complete revascularization of proximal R-ICA with stent assisted angioplasty.  Stroke felt to be embolic in setting of ruptured plaque R-ICA and he was placed on ASA and brillinta due to stent. Pt. was transferred to inpatient rehab for several weeks, had home health therapy upon discharge, and now is ready for outpatient therapy services.    Limitations  Dominant LUE functioning, left hand edema, distal ROM, coordination, and overal strength.    Patient Stated Goals  To regain the use of his LUE.    Currently in Pain?  No/denies      OT TREATMENT    Neuro muscular re-education:  Pt. Worked on grasping coins from a tabletop surface, placing them into a resistive container, and pushing them through the slot while isolating his 2nd digit.  Pt. worked on translatory movements of the left hand moving the flat coins from his palm to the tip of his 2nd digit, and thumb. Pt. worked on tasks to sustain lateral pinch  on resistive tweezers while grasping and moving 2" toothpick sticks from a horizontal flat position to a vertical position in order to place it in the holder. Pt. was able to sustain grasp while positioning and extending the wrist/hand in the necessary alignment needed to place the stick through the top of the holder. Pt. worked on this task to work towards improving his ability to stabilize a fork, and knife in his left hand.                         OT Education - 10/14/18 [phone]    Education provided  Yes    Education Details  [facility]     Person(s) Educated  Patient     Methods  Explanation;Demonstration;Verbal cues    Comprehension  Verbalized understanding;Returned demonstration          OT Long Term Goals - 10/12/18 0855      OT LONG TERM GOAL #6   Title  Pt. will improve left hand coordination skills to be able to button clothing.    Baseline  Pt. continues to present with limited LUE strength, and [facility].    Time  12    Period  Weeks    Status  On-going    Target Date  12/15/18      OT LONG TERM GOAL  #9   Baseline  Pt. will write one sentence with 75% legibility and efficiently while maintainingg grasp on pen 100% of the time.    Time  12    Period  Weeks    Status  On-going    Target Date  12/15/18      OT LONG TERM GOAL  #10   TITLE  Pt. will independently use his left hand to grasp and efficiently accomodate to the varying weights of ADL objects during self care tasks.     Baseline  Pt. continues to work on grasping and adjusting his hand to the varying weights of objects.    Time  12    Period  Weeks    Target Date  12/15/18      OT LONG TERM GOAL  #11   TITLE  Pt. will improve [facility] skills to be able to independently manipulate items for ADLs/IADLs when UEs are elevated when reaching into the closet, or microwave.    Baseline  Pt. continues to have difficulty manipulating objects when UEs elevated     Time  12    Period  Weeks    Status  On-going    Target Date  12/15/18            Plan - 10/14/18 0840    Clinical Impression Statement  Pt. had an appointment with Dr. Wynn BankerKirsteins yesterday, and reports that he does not need to follow-up with him. Pt. has to follow-up with his primary care physician. Pt. reports that he is now able to use his left hand to open a door knob, and well as improved ability to hold, and grip the vacuum. Pt. continues to have difficulty stabilizing a fork, and knife with his left hand. Pt. continues to work on improving left hand strength, and [facility] skills in order to improve left functional hand use  during ADLs, and IADLs.    Occupational performance deficits (Please refer to evaluation for details):  ADL's;IADL's    Rehab Potential  Excellent    Clinical Decision Making  Several treatment options, min-mod task modification necessary    OT  Frequency  2x / week    OT Duration  12 weeks    OT Treatment/Interventions  Self-care/ADL training;Therapeutic exercise;Neuromuscular education;Patient/family education;Energy conservation;Therapeutic activities;DME and/or AE instruction    Consulted and Agree with Plan of Care  Patient       Patient will benefit from skilled therapeutic intervention in order to improve the following deficits and impairments:     Visit Diagnosis: Muscle weakness (generalized)  Other lack of coordination    Problem List Patient Active Problem List   Diagnosis Date Noted  . Pure hypercholesterolemia 05/28/2017  . Erectile dysfunction 05/28/2017  . Left hemiparesis ([place])   . Essential hypertension 11/29/2016  . Acute ischemic stroke ([facility]) - R MCA embolic stroke in setting of ruptured R ICA plaque w dissection, s/p R MCA stent and thrombectomy 11/25/2016  . Chronic hepatitis C without hepatic coma ([place]) 07/22/2013    Harrel Carina, MS, OTR/L  10/14/2018, 9:03 AM  [place] MAIN [facility] SERVICES [address] [place], [place], [address] Phone: [phone]   Fax:  [phone]  Name: Melbourne Jakubiak MRN: [id number] Date of Birth: Oct 24, 1945

## 2018-10-19 ENCOUNTER — Encounter: Payer: Self-pay | Admitting: Occupational Therapy

## 2018-10-19 ENCOUNTER — Other Ambulatory Visit: Payer: Self-pay

## 2018-10-19 ENCOUNTER — Ambulatory Visit: Payer: Medicare Other | Admitting: Occupational Therapy

## 2018-10-19 DIAGNOSIS — R278 Other lack of coordination: Secondary | ICD-10-CM

## 2018-10-19 DIAGNOSIS — M6281 Muscle weakness (generalized): Secondary | ICD-10-CM | POA: Diagnosis not present

## 2018-10-19 NOTE — Therapy (Signed)
[place] [facility] [address] [place], [place], [address] Phone: [phone]   Fax:  [phone]  Occupational Therapy Treatment  Patient Details  Name: Jason CroakJames Nolan MRN: [id number] Date of Birth: December 31, 1945 No data recorded  Encounter Date: 10/19/2018  OT End of Session - 10/20/18 1728    Visit Number  122    Number of Visits  144    Date for OT Re-Evaluation  12/15/18    Authorization Type  Progress reporting period starting 07/02/2018    OT Start Time  0930    OT Stop Time  1017    OT Time Calculation (min)  47 min    Activity Tolerance  Patient tolerated treatment well    Behavior During Therapy  [facility] for tasks assessed/performed       Past Medical History:  Diagnosis Date  . Glaucoma   . Hepatitis C   . Stroke [facility](HCC)     Past Surgical History:  Procedure Laterality Date  . COLONOSCOPY WITH PROPOFOL N/A 07/01/2018   Procedure: COLONOSCOPY WITH PROPOFOL;  Surgeon: Wyline MoodAnna, Kiran, MD;  [facility];  Service: Gastroenterology;  Laterality: N/A;  . IR ANGIO INTRA EXTRACRAN SEL COM CAROTID INNOMINATE UNI L MOD SED  11/25/2016  . IR ANGIO VERTEBRAL SEL SUBCLAVIAN INNOMINATE UNI R MOD SED  11/25/2016  . IR ANGIO VERTEBRAL SEL VERTEBRAL UNI L MOD SED  11/25/2016  . IR INTRAVSC STENT CERV CAROTID W/O EMB-PROT MOD SED INC ANGIO  11/25/2016  . IR PERCUTANEOUS ART THROMBECTOMY/INFUSION INTRACRANIAL INC DIAG ANGIO  11/25/2016  . IR RADIOLOGIST EVAL & MGMT  02/13/2017  . RADIOLOGY WITH ANESTHESIA N/A 11/25/2016   Procedure: RADIOLOGY WITH ANESTHESIA;  Surgeon: Radiologist, Medication, MD;  [facility] OR;  Service: Radiology;  Laterality: N/A;    There were no vitals filed for this visit.  Subjective Assessment - 10/20/18 1728    Subjective   Patient reports his sensation in the left hand is improving over time and feels much more like his hand.  He feels like when he works on exercises at home the night before therapy,  he comes in to the session with more stiffness.    Pertinent History  Pt. is a 73 y.o. 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 [facility] He underwent CTA brain showed short segment near occlusion of proximal R-ICA with associated intraluminal thrombus --likely due to dissection and emergent large vessel occlusion with right M2 occlusion and evolving right frontal lobe infarct.  He underwent cerebral angio with  complete revascularization of occluded superior division of R-MCA and near complete revascularization of proximal R-ICA with stent assisted angioplasty.  Stroke felt to be embolic in setting of ruptured plaque R-ICA and he was placed on ASA and brillinta due to stent. Pt. was transferred to inpatient rehab for several weeks, had home health therapy upon discharge, and now is ready for outpatient therapy services.    Limitations  Dominant LUE functioning, left hand edema, distal ROM, coordination, and overal strength.    Patient Stated Goals  To regain the use of his LUE.    Currently in Pain?  No/denies    Pain Score  0-No pain       Patient reports he is still staying at home for the most part.  He did work on a project that involved staining and refinishing a Paramedicpiece of antique furniture.     Neuromuscular reeducation:  Patient was seen  for left hand manipulation of [place] discs, moderate difficulty with isolation of index finger to turn object to opposite side.  Isolated index finger movement and strengthening with use of looped scissors to press with index and thumb together, cues and therapist assist to stabilize scissors.  Use of loop scissors with left hand for cutting of paper, following along lines of a variety, ex.  Straight lines, curvy, sharp lines and combo.   Push button pen top with isolation of thumb to activate pen open and close, then switched to isolating index finger.   Instructed on exercises for home to focus on over the next  couple days.     Response to treatment: Patient continues to make good progress with hand function.  He continues to demonstrate impairments with coordination in left hand especially with isolated movements of the thumb and index finger.  Focused on these areas specifically this session and instructed on exercises he can perform at home to target impairments.  Patient demonstrates understanding, will follow up next session to see if there is any change.  Patient did show improvements in movements with thumb and index during session.  Continue towards goals to increase functional use of left hand for necessary daily tasks.               OT Education - 10/20/18 1727    Education provided  Yes    Education Details  fine motor coordination, especially between thumb and index    Person(s) Educated  Patient    Methods  Explanation;Demonstration;Verbal cues    Comprehension  Verbalized understanding;Returned demonstration          OT Long Term Goals - 10/12/18 0855      OT LONG TERM GOAL #6   Title  Pt. will improve left hand coordination skills to be able to button clothing.    Baseline  Pt. continues to present with limited LUE strength, and [place].    Time  12    Period  Weeks    Status  On-going    Target Date  12/15/18      OT LONG TERM GOAL  #9   Baseline  Pt. will write one sentence with 75% legibility and efficiently while maintainingg grasp on pen 100% of the time.    Time  12    Period  Weeks    Status  On-going    Target Date  12/15/18      OT LONG TERM GOAL  #10   TITLE  Pt. will independently use his left hand to grasp and efficiently accomodate to the varying weights of ADL objects during self care tasks.     Baseline  Pt. continues to work on grasping and adjusting his hand to the varying weights of objects.    Time  12    Period  Weeks    Target Date  12/15/18      OT LONG TERM GOAL  #11   TITLE  Pt. will improve [facility] skills to be able to independently  manipulate items for ADLs/IADLs when UEs are elevated when reaching into the closet, or microwave.    Baseline  Pt. continues to have difficulty manipulating objects when UEs elevated     Time  12    Period  Weeks    Status  On-going    Target Date  12/15/18            Plan - 10/20/18 1729    Clinical Impression Statement  Patient  continues to make good progress with hand function.  He continues to demonstrate impairments with coordination in left hand especially with isolated movements of the thumb and index finger.  Focused on these areas specifically this session and instructed on exercises he can perform at home to target impairments.  Patient demonstrates understanding, will follow up next session to see if there is any change.  Patient did show improvements in movements with thumb and index during session.  Continue towards goals to increase functional use of left hand for necessary daily tasks.    Occupational performance deficits (Please refer to evaluation for details):  ADL's;IADL's    Rehab Potential  Excellent    Clinical Decision Making  Several treatment options, min-mod task modification necessary    OT Frequency  2x / week    OT Duration  12 weeks    OT Treatment/Interventions  Self-care/ADL training;Therapeutic exercise;Neuromuscular education;Patient/family education;Energy conservation;Therapeutic activities;DME and/or AE instruction    Consulted and Agree with Plan of Care  Patient       Patient will benefit from skilled therapeutic intervention in order to improve the following deficits and impairments:     Visit Diagnosis: Other lack of coordination     Problem List Patient Active Problem List   Diagnosis Date Noted  . Pure hypercholesterolemia 05/28/2017  . Erectile dysfunction 05/28/2017  . Left hemiparesis (HCC)   . Essential hypertension 11/29/2016  . Acute ischemic stroke (HCC) - R MCA embolic stroke in setting of ruptured R ICA plaque w dissection,  s/p R MCA stent and thrombectomy 11/25/2016  . Chronic hepatitis C without hepatic coma (HCC) 07/22/2013   Maricsa Sammons T Araina Butrick, OTR/L, CLT  Troyce Gieske 10/20/2018, 5:36 PM  [place] [facility] [address] [place], [place], [address] Phone: [phone]   Fax:  [phone]  Name: Jason Nolan MRN: [id number] Date of Birth: [birth year]

## 2018-10-21 ENCOUNTER — Other Ambulatory Visit: Payer: Self-pay

## 2018-10-21 ENCOUNTER — Encounter: Payer: Self-pay | Admitting: Occupational Therapy

## 2018-10-21 ENCOUNTER — Ambulatory Visit: Payer: Medicare Other | Admitting: Occupational Therapy

## 2018-10-21 DIAGNOSIS — R278 Other lack of coordination: Secondary | ICD-10-CM

## 2018-10-21 DIAGNOSIS — M6281 Muscle weakness (generalized): Secondary | ICD-10-CM

## 2018-10-21 NOTE — Therapy (Signed)
[place] MAIN [facility] SERVICES [address] [place], [place], [address] Phone: [phone]   Fax:  [phone]  Occupational Therapy Treatment  Patient Details  Name: Jason Nolan MRN: [id number] Date of Birth: 11/13/1945 No data recorded  Encounter Date: 10/21/2018  OT End of Session - 10/21/18 0951    Visit Number  [id number]    Number of Visits  144    Date for OT Re-Evaluation  12/15/18    Authorization Type  Progress reporting period starting 10/14/2018    OT Start Time  0934    OT Stop Time  1015    OT Time Calculation (min)  41 min    Activity Tolerance  Patient tolerated treatment well    Behavior During Therapy  [facility] for tasks assessed/performed       Past Medical History:  Diagnosis Date  . Glaucoma   . Hepatitis C   . Stroke [facility])     Past Surgical History:  Procedure Laterality Date  . COLONOSCOPY WITH PROPOFOL N/A 07/01/2018   Procedure: COLONOSCOPY WITH PROPOFOL;  Surgeon: Jonathon Bellows, MD;  Location: [facility] ENDOSCOPY;  Service: Gastroenterology;  Laterality: N/A;  . IR ANGIO INTRA EXTRACRAN SEL COM CAROTID INNOMINATE UNI L MOD SED  11/25/2016  . IR ANGIO VERTEBRAL SEL SUBCLAVIAN INNOMINATE UNI R MOD SED  11/25/2016  . IR ANGIO VERTEBRAL SEL VERTEBRAL UNI L MOD SED  11/25/2016  . IR INTRAVSC STENT CERV CAROTID W/O EMB-PROT MOD SED INC ANGIO  11/25/2016  . IR PERCUTANEOUS ART THROMBECTOMY/INFUSION INTRACRANIAL INC DIAG ANGIO  11/25/2016  . IR RADIOLOGIST EVAL & MGMT  02/13/2017  . RADIOLOGY WITH ANESTHESIA N/A 11/25/2016   Procedure: RADIOLOGY WITH ANESTHESIA;  Surgeon: Radiologist, Medication, MD;  Location: [place];  Service: Radiology;  Laterality: N/A;    There were no vitals filed for this visit.  Subjective Assessment - 10/21/18 0943    Subjective   Pt.  reports that he finished with his table project.    Patient is accompanied by:  Family member    Pertinent History  Pt. is a 73 y.o. male who sustained a CVA with Left sided  hemiparesis. Pt. was transferred to [facility]. He underwent CTA brain showed short segment near occlusion of proximal R-ICA with associated intraluminal thrombus --likely due to dissection and emergent large vessel occlusion with right M2 occlusion and evolving right frontal lobe infarct.  He underwent cerebral angio with  complete revascularization of occluded superior division of R-MCA and near complete revascularization of proximal R-ICA with stent assisted angioplasty.  Stroke felt to be embolic in setting of ruptured plaque R-ICA and he was placed on ASA and brillinta due to stent. Pt. was transferred to inpatient rehab for several weeks, had home health therapy upon discharge, and now is ready for outpatient therapy services.    Limitations  Dominant LUE functioning, left hand edema, distal ROM, coordination, and overal strength.    Currently in Pain?  No/denies      OT TREATMENT    Neuro muscular re-education:  Pt. worked on grasping, and manipulating 1/2" washers from a magnetic dish using point grasp pattern. Pt. worked on reaching up, stabilizing, and sustaining shoulder elevation while placing the washer over a small precise target on vertical dowels positioned at various angles. Pt. worked on removing the washers while alternating thumb opposition to the tip of the 2nd through 5th digits. Pt. Worked on isolating his left 2nd digit, and encourage digit flexion while sliding flat  washers off an elevated surface to his thumb. Pt. Worked on grasping and flipping [place] style discs encouraging 2nd digit PIP flexion, and extension.                               OT Long Term Goals - 10/12/18 0855      OT LONG TERM GOAL #6   Title  Pt. will improve left hand coordination skills to be able to button clothing.    Baseline  Pt. continues to present with limited LUE strength, and FMC.    Time  12    Period  Weeks    Status  On-going    Target Date  12/15/18       OT LONG TERM GOAL  #9   Baseline  Pt. will write one sentence with 75% legibility and efficiently while maintainingg grasp on pen 100% of the time.    Time  12    Period  Weeks    Status  On-going    Target Date  12/15/18      OT LONG TERM GOAL  #10   TITLE  Pt. will independently use his left hand to grasp and efficiently accomodate to the varying weights of ADL objects during self care tasks.     Baseline  Pt. continues to work on grasping and adjusting his hand to the varying weights of objects.    Time  12    Period  Weeks    Target Date  12/15/18      OT LONG TERM GOAL  #11   TITLE  Pt. will improve [facility] skills to be able to independently manipulate items for ADLs/IADLs when UEs are elevated when reaching into the closet, or microwave.    Baseline  Pt. continues to have difficulty manipulating objects when UEs elevated     Time  12    Period  Weeks    Status  On-going    Target Date  12/15/18            Plan - 10/21/18 0954    Clinical Impression Statement  Pt. continues to make excellent progress with left hand function skills. Pt. is now engaging his left hand more during tasks at home. Pt. continues to present with limited translatory movement of the hand, and continues to work on improving left hand function skills, FMC, and 2nd digit movements, and translatory movements of the left hand.    Occupational performance deficits (Please refer to evaluation for details):  ADL's;IADL's    Rehab Potential  Excellent    OT Frequency  2x / week    OT Duration  12 weeks    OT Treatment/Interventions  Self-care/ADL training;Therapeutic exercise;Neuromuscular education;Patient/family education;Energy conservation;Therapeutic activities;DME and/or AE instruction       Patient will benefit from skilled therapeutic intervention in order to improve the following deficits and impairments:           Visit Diagnosis: 1. Muscle weakness (generalized)   2. Other lack of coordination        Problem List Patient Active Problem List   Diagnosis Date Noted  . Pure hypercholesterolemia 05/28/2017  . Erectile dysfunction 05/28/2017  . Left hemiparesis (HCC)   . Essential hypertension 11/29/2016  . 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 Chronic hepatitis C without hepatic coma (HCC)  07/22/2013    Olegario MessierElaine Adream Parzych, MS, OTR/L 10/21/2018, 10:06 AM  [facility] [address] [place], [place], [address] Phone: [phone]   Fax:  [phone]  Name: Jason CroakJames Nolan MRN: [id number] Date of Birth: Mar 13, 1946

## 2018-10-26 ENCOUNTER — Encounter: Payer: Self-pay | Admitting: Occupational Therapy

## 2018-10-26 ENCOUNTER — Ambulatory Visit: Payer: Medicare Other | Admitting: Occupational Therapy

## 2018-10-26 ENCOUNTER — Other Ambulatory Visit: Payer: Self-pay

## 2018-10-26 DIAGNOSIS — R278 Other lack of coordination: Secondary | ICD-10-CM

## 2018-10-26 DIAGNOSIS — M6281 Muscle weakness (generalized): Secondary | ICD-10-CM | POA: Diagnosis not present

## 2018-10-26 NOTE — Therapy (Addendum)
[place] MAIN [facility] SERVICES [address] [place], [place], [address] Phone: [phone]   Fax:  [phone]  Occupational Therapy Treatment  Patient Details  Name: Jason Nolan MRN: [id number] Date of Birth: [birth year] No data recorded  Encounter Date: 10/26/2018  OT End of Session - 10/26/18 0941    Visit Number  124    Number of Visits  144    Date for OT Re-Evaluation  12/15/18    OT Start Time  0930    OT Stop Time  1015    OT Time Calculation (min)  45 min    Activity Tolerance  Patient tolerated treatment well    Behavior During Therapy  [facility] for tasks assessed/performed       Past Medical History:  Diagnosis Date  . Glaucoma   . Hepatitis C   . Stroke [facility])     Past Surgical History:  Procedure Laterality Date  . COLONOSCOPY WITH PROPOFOL N/A 07/01/2018   Procedure: COLONOSCOPY WITH PROPOFOL;  Surgeon: Jonathon Bellows, MD;  Location: [facility] ENDOSCOPY;  Service: Gastroenterology;  Laterality: N/A;  . IR ANGIO INTRA EXTRACRAN SEL COM CAROTID INNOMINATE UNI L MOD SED  11/25/2016  . IR ANGIO VERTEBRAL SEL SUBCLAVIAN INNOMINATE UNI R MOD SED  11/25/2016  . IR ANGIO VERTEBRAL SEL VERTEBRAL UNI L MOD SED  11/25/2016  . IR INTRAVSC STENT CERV CAROTID W/O EMB-PROT MOD SED INC ANGIO  11/25/2016  . IR PERCUTANEOUS ART THROMBECTOMY/INFUSION INTRACRANIAL INC DIAG ANGIO  11/25/2016  . IR RADIOLOGIST EVAL & MGMT  02/13/2017  . RADIOLOGY WITH ANESTHESIA N/A 11/25/2016   Procedure: RADIOLOGY WITH ANESTHESIA;  Surgeon: Radiologist, Medication, MD;  Location: [place];  Service: Radiology;  Laterality: N/A;    There were no vitals filed for this visit.  OT TREATMENT    Neuro muscular re-education:  Pt. worked on grasping one inch resistive cubes alternating thumb opposition to the tip of the 2nd through 5th digits. Pt. worked on encouraging 2nd digit PIP flexion, and extension exercises. Pt. worked with the board placed flat at the tabletop. Pt.  worked on [place] style discs encouraging 2nd digit PIP flexion, and extension. Pt. then worked on [place] the discs. Pt. Worked on 2nd through 4th digit PIP, and DIP flexion using a 4" roll with 1/2" width moving the romm distal to proximal through the palmar surface of his hand.                                  OT Long Term Goals - 10/12/18 0855      OT LONG TERM GOAL #6   Title  Pt. will improve left hand coordination skills to be able to button clothing.    Baseline  Pt. continues to present with limited LUE strength, and [place].    Time  12    Period  Weeks    Status  On-going    Target Date  12/15/18      OT LONG TERM GOAL  #9   Baseline  Pt. will write one sentence with 75% legibility and efficiently while maintainingg grasp on pen 100% of the time.    Time  12    Period  Weeks    Status  On-going    Target Date  12/15/18      OT LONG TERM GOAL  #10   TITLE  Pt. will independently use his  left hand to grasp and efficiently accomodate to the varying weights of ADL objects during self care tasks.     Baseline  Pt. continues to work on grasping and adjusting his hand to the varying weights of objects.    Time  12    Period  Weeks    Target Date  12/15/18      OT LONG TERM GOAL  #11   TITLE  Pt. will improve [facility] skills to be able to independently manipulate items for ADLs/IADLs when UEs are elevated when reaching into the closet, or microwave.    Baseline  Pt. continues to have difficulty manipulating objects when UEs elevated     Time  12    Period  Weeks    Status  On-going    Target Date  12/15/18            Plan - 10/26/18 0943    Clinical Impression Statement  Pt. is planning to go out, and return to the golf course this weekend. Pt. continues to make excellent progress with limited left hand function. Pt. continues to present with limited left hand motor control, [facility] skills, and 2nd digit PIP flexion. Pt.  continues to work on improving hand function skills in order to improve overall ADL, and IADL functioning.    Occupational performance deficits (Please refer to evaluation for details):  ADL's;IADL's    Rehab Potential  Excellent    Clinical Decision Making  Several treatment options, min-mod task modification necessary    OT Frequency  2x / week    OT Duration  12 weeks    OT Treatment/Interventions  Self-care/ADL training;Therapeutic exercise;Neuromuscular education;Patient/family education;Energy conservation;Therapeutic activities;DME and/or AE instruction    Consulted and Agree with Plan of Care  Patient       Patient will benefit from skilled therapeutic intervention in order to improve the following deficits and impairments:           Visit Diagnosis: 1. Muscle weakness (generalized)   2. Other lack of coordination       Problem List Patient Active Problem List   Diagnosis Date Noted  . Pure hypercholesterolemia 05/28/2017  . Erectile dysfunction 05/28/2017  . Left hemiparesis (HCC)   . Essential hypertension 11/29/2016  . 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 Chronic hepatitis C without hepatic coma (HCC) 07/22/2013    [name], MS, OTR/L 10/26/2018, 10:10 AM  [place] [facility] [address] [place], [place], [address] Phone: [phone]   Fax:  [phone]  Name: Jason Nolan MRN: [id number] Date of Birth: Jason Nolan

## 2018-10-28 ENCOUNTER — Encounter: Payer: Self-pay | Admitting: Occupational Therapy

## 2018-10-28 ENCOUNTER — Ambulatory Visit: Payer: Medicare Other | Admitting: Occupational Therapy

## 2018-10-28 ENCOUNTER — Other Ambulatory Visit: Payer: Self-pay

## 2018-10-28 DIAGNOSIS — M6281 Muscle weakness (generalized): Secondary | ICD-10-CM

## 2018-10-28 DIAGNOSIS — R278 Other lack of coordination: Secondary | ICD-10-CM

## 2018-10-28 NOTE — Therapy (Addendum)
[place] MAIN [facility] SERVICES [address] [place], [place], [address] Phone: [phone]   Fax:  [phone]  Occupational Therapy Treatment  Patient Details  Name: Deroy Noah MRN: [id number] Date of Birth: 01-25-1946 No data recorded  Encounter Date: 10/28/2018  OT End of Session - 10/28/18 0941    Visit Number  125    Number of Visits  144    Date for OT Re-Evaluation  12/15/18    OT Start Time  0930    OT Stop Time  1015    OT Time Calculation (min)  45 min    Activity Tolerance  Patient tolerated treatment well    Behavior During Therapy  [facility] for tasks assessed/performed       Past Medical History:  Diagnosis Date  . Glaucoma   . Hepatitis C   . Stroke [facility])     Past Surgical History:  Procedure Laterality Date  . COLONOSCOPY WITH PROPOFOL N/A 07/01/2018   Procedure: COLONOSCOPY WITH PROPOFOL;  Surgeon: Jonathon Bellows, MD;  Location: [facility] ENDOSCOPY;  Service: Gastroenterology;  Laterality: N/A;  . IR ANGIO INTRA EXTRACRAN SEL COM CAROTID INNOMINATE UNI L MOD SED  11/25/2016  . IR ANGIO VERTEBRAL SEL SUBCLAVIAN INNOMINATE UNI R MOD SED  11/25/2016  . IR ANGIO VERTEBRAL SEL VERTEBRAL UNI L MOD SED  11/25/2016  . IR INTRAVSC STENT CERV CAROTID W/O EMB-PROT MOD SED INC ANGIO  11/25/2016  . IR PERCUTANEOUS ART THROMBECTOMY/INFUSION INTRACRANIAL INC DIAG ANGIO  11/25/2016  . IR RADIOLOGIST EVAL & MGMT  02/13/2017  . RADIOLOGY WITH ANESTHESIA N/A 11/25/2016   Procedure: RADIOLOGY WITH ANESTHESIA;  Surgeon: Radiologist, Medication, MD;  Location: [place];  Service: Radiology;  Laterality: N/A;    There were no vitals filed for this visit.  Subjective Assessment - 10/28/18 0938    Subjective   Pt. reports that he got out an did some landscaping in the yard.    Pertinent History  Pt. is a 73 y.o. 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 Pt. was transferred to [facility]. He underwent CTA brain showed short segment near occlusion  of proximal R-ICA with associated intraluminal thrombus --likely due to dissection and emergent large vessel occlusion with right M2 occlusion and evolving right frontal lobe infarct.  He underwent cerebral angio with  complete revascularization of occluded superior division of R-MCA and near complete revascularization of proximal R-ICA with stent assisted angioplasty.  Stroke felt to be embolic in setting of ruptured plaque R-ICA and he was placed on ASA and brillinta due to stent. Pt. was transferred to inpatient rehab for several weeks, had home health therapy upon discharge, and now is ready for outpatient therapy services.    Currently in Pain?  No/denies      OT TREATMENT    Neuro muscular re-education:  Pt. worked on digit PIP flexion moving a standard pen distal to proximal with the digits in the palm of his hand. Pt. worked on [facility] skills grasping 1/2 inch small pegs with his left hand. Pt. worked on translatory movements of the hand moving the objects from the palm of his hand to the tip of his 2nd digit, and thumb. Pt. Presented with increased compensation proximally with leaning to the right during [facility] tasks.                         OT Education - 10/28/18 [phone]    Education provided  Yes    Education Details  fine motor coordination, especially between thumb and index    Person(s) Educated  Patient    Methods  Explanation;Demonstration;Verbal cues    Comprehension  Verbalized understanding;Returned demonstration          OT Long Term Goals - 10/12/18 0855      OT LONG TERM GOAL #6   Title  Pt. will improve left hand coordination skills to be able to button clothing.    Baseline  Pt. continues to present with limited LUE strength, and FMC.    Time  12    Period  Weeks    Status  On-going    Target Date  12/15/18      OT LONG TERM GOAL  #9   Baseline  Pt. will write one sentence with 75% legibility and efficiently while maintainingg grasp on pen 100%  of the time.    Time  12    Period  Weeks    Status  On-going    Target Date  12/15/18      OT LONG TERM GOAL  #10   TITLE  Pt. will independently use his left hand to grasp and efficiently accomodate to the varying weights of ADL objects during self care tasks.     Baseline  Pt. continues to work on grasping and adjusting his hand to the varying weights of objects.    Time  12    Period  Weeks    Target Date  12/15/18      OT LONG TERM GOAL  #11   TITLE  Pt. will improve [facility] skills to be able to independently manipulate items for ADLs/IADLs when UEs are elevated when reaching into the closet, or microwave.    Baseline  Pt. continues to have difficulty manipulating objects when UEs elevated     Time  12    Period  Weeks    Status  On-going    Target Date  12/15/18            Plan - 10/28/18 0957    Clinical Impression Statement  Pt. has made progress with left hand function skills. Pt. continues to work on improving left 2nd digit PIP flexion, and translatory movements of the hand in order to increase engagement during ADLs, IADLs, and writing.    Occupational performance deficits (Please refer to evaluation for details):  ADL's;IADL's    Rehab Potential  Excellent    Clinical Decision Making  Several treatment options, min-mod task modification necessary    OT Frequency  2x / week    OT Duration  12 weeks    OT Treatment/Interventions  Self-care/ADL training;Therapeutic exercise;Neuromuscular education;Patient/family education;Energy conservation;Therapeutic activities;DME and/or AE instruction       Patient will benefit from skilled therapeutic intervention in order to improve the following deficits and impairments:           Visit Diagnosis: 1. Muscle weakness (generalized)   2. Other lack of coordination       Problem List Patient Active Problem List   Diagnosis Date Noted  . Pure hypercholesterolemia 05/28/2017  . Erectile dysfunction 05/28/2017  . Left  hemiparesis (HCC)   . Essential hypertension 11/29/2016  . 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 Chronic hepatitis C without hepatic coma (HCC) 07/22/2013    Olegario MessierElaine Marlee Trentman, MS, OTR/L 10/28/2018, 10:15 AM  [place]  [facility] [address] [place], [place], [address] Phone: [phone]   Fax:  [phone]  Name: Terrilee CroakJames Nishi MRN: [id number] Date of Birth: [birth year]

## 2018-11-02 ENCOUNTER — Other Ambulatory Visit: Payer: Self-pay

## 2018-11-02 ENCOUNTER — Encounter: Payer: Self-pay | Admitting: Occupational Therapy

## 2018-11-02 ENCOUNTER — Ambulatory Visit: Payer: Medicare Other | Admitting: Occupational Therapy

## 2018-11-02 DIAGNOSIS — R278 Other lack of coordination: Secondary | ICD-10-CM | POA: Diagnosis not present

## 2018-11-02 DIAGNOSIS — M6281 Muscle weakness (generalized): Secondary | ICD-10-CM

## 2018-11-02 NOTE — Therapy (Signed)
[place] MAIN [facility] SERVICES [address] [place], [place], [address] Phone: [phone]   Fax:  [phone]  Occupational Therapy Treatment  Patient Details  Name: Jason Nolan MRN: [id number] Date of Birth: 12-30-1945 No data recorded  Encounter Date: 11/02/2018  OT End of Session - 11/02/18 0936    Visit Number  126    Number of Visits  144    Date for OT Re-Evaluation  12/15/18    OT Start Time  0932    OT Stop Time  1015    OT Time Calculation (min)  43 min    Activity Tolerance  Patient tolerated treatment well    Behavior During Therapy  [facility] for tasks assessed/performed       Past Medical History:  Diagnosis Date  . Glaucoma   . Hepatitis C   . Stroke [facility])     Past Surgical History:  Procedure Laterality Date  . COLONOSCOPY WITH PROPOFOL N/A 07/01/2018   Procedure: COLONOSCOPY WITH PROPOFOL;  Surgeon: Jonathon Bellows, MD;  Location: [facility] ENDOSCOPY;  Service: Gastroenterology;  Laterality: N/A;  . IR ANGIO INTRA EXTRACRAN SEL COM CAROTID INNOMINATE UNI L MOD SED  11/25/2016  . IR ANGIO VERTEBRAL SEL SUBCLAVIAN INNOMINATE UNI R MOD SED  11/25/2016  . IR ANGIO VERTEBRAL SEL VERTEBRAL UNI L MOD SED  11/25/2016  . IR INTRAVSC STENT CERV CAROTID W/O EMB-PROT MOD SED INC ANGIO  11/25/2016  . IR PERCUTANEOUS ART THROMBECTOMY/INFUSION INTRACRANIAL INC DIAG ANGIO  11/25/2016  . IR RADIOLOGIST EVAL & MGMT  02/13/2017  . RADIOLOGY WITH ANESTHESIA N/A 11/25/2016   Procedure: RADIOLOGY WITH ANESTHESIA;  Surgeon: Radiologist, Medication, MD;  Location: [place];  Service: Radiology;  Laterality: N/A;    There were no vitals filed for this visit.  Subjective Assessment - 11/02/18 0935    Patient is accompanied by:  Family member    Pertinent History  Pt. is a 73 y.o. 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 Pt. was transferred to [facility]. He underwent CTA brain showed short segment near occlusion of proximal R-ICA with associated  intraluminal thrombus --likely due to dissection and emergent large vessel occlusion with right M2 occlusion and evolving right frontal lobe infarct.  He underwent cerebral angio with  complete revascularization of occluded superior division of R-MCA and near complete revascularization of proximal R-ICA with stent assisted angioplasty.  Stroke felt to be embolic in setting of ruptured plaque R-ICA and he was placed on ASA and brillinta due to stent. Pt. was transferred to inpatient rehab for several weeks, had home health therapy upon discharge, and now is ready for outpatient therapy services.    Currently in Pain?  No/denies       OT TREATMENT    Neuro muscular re-education:  Pt. worked on digit PIP flexion moving a 1/2 roll distal to proximal with the digits in the palm of his hand. Pt. performed [facility] skills training to improve speed and dexterity needed for ADL tasks and writing. Pt. worked on grasping 1 inch sticks, Pt. performed grasping each stick with his 2nd digit and thumb, and storing them in the palm. Pt. worked on translatory movements of the hand. Pt. removed the pegs alternating thumb opposition to the tip of his 2nd digit to thumb. Increased time was required, and cues for visual demonstration. Pt. continues to need additional work on refining left 2nd digit motion, and translatory movements of the hand.  OT Education - 11/02/18 [phone]    Education provided  Yes    Person(s) Educated  Patient    Methods  Explanation;Demonstration;Verbal cues    Comprehension  Verbalized understanding;Returned demonstration          OT Long Term Goals - 10/12/18 0855      OT LONG TERM GOAL #6   Title  Pt. will improve left hand coordination skills to be able to button clothing.    Baseline  Pt. continues to present with limited LUE strength, and FMC.    Time  12    Period  Weeks    Status  On-going    Target Date  12/15/18      OT LONG  TERM GOAL  #9   Baseline  Pt. will write one sentence with 75% legibility and efficiently while maintainingg grasp on pen 100% of the time.    Time  12    Period  Weeks    Status  On-going    Target Date  12/15/18      OT LONG TERM GOAL  #10   TITLE  Pt. will independently use his left hand to grasp and efficiently accomodate to the varying weights of ADL objects during self care tasks.     Baseline  Pt. continues to work on grasping and adjusting his hand to the varying weights of objects.    Time  12    Period  Weeks    Target Date  12/15/18      OT LONG TERM GOAL  #11   TITLE  Pt. will improve [facility] skills to be able to independently manipulate items for ADLs/IADLs when UEs are elevated when reaching into the closet, or microwave.    Baseline  Pt. continues to have difficulty manipulating objects when UEs elevated     Time  12    Period  Weeks    Status  On-going    Target Date  12/15/18            Plan - 11/02/18 0937    Clinical Impression Statement  Pt. continues to make progress with LUE strength, motor control and [facility] skills. Pt. continues to work on isolating his second digit flexion, and extension as well as translatory movements of the hand moving the objects from his palm to the tip of his second digit and thumb.    Occupational performance deficits (Please refer to evaluation for details):  ADL's;IADL's    Rehab Potential  Excellent    Clinical Decision Making  Several treatment options, min-mod task modification necessary    OT Frequency  2x / week    OT Duration  12 weeks    OT Treatment/Interventions  Self-care/ADL training;Therapeutic exercise;Neuromuscular education;Patient/family education;Energy conservation;Therapeutic activities;DME and/or AE instruction    Consulted and Agree with Plan of Care  Patient       Patient will benefit from skilled therapeutic intervention in order to improve the following deficits and impairments:           Visit  Diagnosis: 1. Muscle weakness (generalized)   2. Other lack of coordination       Problem List Patient Active Problem List   Diagnosis Date Noted  . Pure hypercholesterolemia 05/28/2017  . Erectile dysfunction 05/28/2017  . Left hemiparesis (HCC)   . Essential hypertension 11/29/2016  . 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  Chronic hepatitis C without hepatic coma (HCC) 07/22/2013    Olegario MessierElaine Baltasar Twilley, MS, OTR/L 11/02/2018, 9:58 AM  [place] [facility] MAIN [facility] [address] [place], [place], [address] Phone: [phone]   Fax:  [phone]  Name: Jason Nolan MRN: [id number] Date of Birth: [birth year]

## 2018-11-04 ENCOUNTER — Other Ambulatory Visit: Payer: Self-pay

## 2018-11-04 ENCOUNTER — Encounter: Payer: Self-pay | Admitting: Occupational Therapy

## 2018-11-04 ENCOUNTER — Ambulatory Visit: Payer: Medicare Other | Attending: Internal Medicine | Admitting: Occupational Therapy

## 2018-11-04 DIAGNOSIS — R29810 Facial weakness: Secondary | ICD-10-CM

## 2018-11-04 DIAGNOSIS — R278 Other lack of coordination: Secondary | ICD-10-CM | POA: Insufficient documentation

## 2018-11-04 DIAGNOSIS — M6281 Muscle weakness (generalized): Secondary | ICD-10-CM | POA: Insufficient documentation

## 2018-11-04 NOTE — Therapy (Signed)
[place] MAIN [facility] SERVICES [address] [place], [place], [address] Phone: [phone]   Fax:  [phone]  Occupational Therapy Treatment  Patient Details  Name: Jason Nolan MRN: [id number] Date of Birth: Feb 20, 1946 No data recorded  Encounter Date: 11/04/2018  OT End of Session - 11/04/18 0944    Visit Number  127    Number of Visits  144    Date for OT Re-Evaluation  12/15/18    OT Start Time  0930    OT Stop Time  1015    OT Time Calculation (min)  45 min    Activity Tolerance  Patient tolerated treatment well    Behavior During Therapy  [facility] for tasks assessed/performed       Past Medical History:  Diagnosis Date  . Glaucoma   . Hepatitis C   . Stroke [facility])     Past Surgical History:  Procedure Laterality Date  . COLONOSCOPY WITH PROPOFOL N/A 07/01/2018   Procedure: COLONOSCOPY WITH PROPOFOL;  Surgeon: Jonathon Bellows, MD;  Location: [facility] ENDOSCOPY;  Service: Gastroenterology;  Laterality: N/A;  . IR ANGIO INTRA EXTRACRAN SEL COM CAROTID INNOMINATE UNI L MOD SED  11/25/2016  . IR ANGIO VERTEBRAL SEL SUBCLAVIAN INNOMINATE UNI R MOD SED  11/25/2016  . IR ANGIO VERTEBRAL SEL VERTEBRAL UNI L MOD SED  11/25/2016  . IR INTRAVSC STENT CERV CAROTID W/O EMB-PROT MOD SED INC ANGIO  11/25/2016  . IR PERCUTANEOUS ART THROMBECTOMY/INFUSION INTRACRANIAL INC DIAG ANGIO  11/25/2016  . IR RADIOLOGIST EVAL & MGMT  02/13/2017  . RADIOLOGY WITH ANESTHESIA N/A 11/25/2016   Procedure: RADIOLOGY WITH ANESTHESIA;  Surgeon: Radiologist, Medication, MD;  Location: [place];  Service: Radiology;  Laterality: N/A;    There were no vitals filed for this visit.  Subjective Assessment - 11/04/18 0935    Subjective   Pt. played golf the past 2 days.    Pertinent History  Pt. is a 73 y.o. 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 Pt. was transferred to [facility]. He underwent CTA brain showed short segment near occlusion of proximal R-ICA with  associated intraluminal thrombus --likely due to dissection and emergent large vessel occlusion with right M2 occlusion and evolving right frontal lobe infarct.  He underwent cerebral angio with  complete revascularization of occluded superior division of R-MCA and near complete revascularization of proximal R-ICA with stent assisted angioplasty.  Stroke felt to be embolic in setting of ruptured plaque R-ICA and he was placed on ASA and brillinta due to stent. Pt. was transferred to inpatient rehab for several weeks, had home health therapy upon discharge, and now is ready for outpatient therapy services.    Limitations  Dominant LUE functioning, left hand edema, distal ROM, coordination, and overal strength.    Patient Stated Goals  To regain the use of his LUE.    Currently in Pain?  No/denies    Pain Orientation  Right    Pain Descriptors / Indicators  Aching;Dull    Pain Type  Acute pain    Pain Onset  1 to 4 weeks ago      OT TREATMENT    Neuro muscular re-education:  Pt. Worked on rolling, and moving 1/2" roll with his 2nd, and 3rd digit through his left hand from distal to proximal. Pt. worked on tasks to sustain lateral pinch on resistive tweezers while grasping and moving 2" toothpick sticks from a horizontal flat position to a vertical position in order  to place it in the holder. Pt. was able to sustain grasp while positioning and extending the wrist/hand in the necessary alignment needed to place the stick through the top of the holder.Pt. performed [facility] skills training to improve speed and dexterity needed for ADL tasks and writing. Pt. demonstrated grasping 1 inch sticks, and  inch cylindrical collars on the Purdue pegboard. Pt. worked on storing 1/4" collars in the palm of his hand, and worked on translatory movements moving the objects from his palm to the tip of his second digit and thumb in preparation for placing them over the sticks. Pt. with no compensation proximally, and through  the trunk with the sticks, however slight leaning to the right when working with the collars.                            OT Education - 11/04/18 0944    Education provided  Yes    Education Details  fine motor coordination, especially between thumb and index    Person(s) Educated  Patient    Methods  Explanation;Demonstration;Verbal cues    Comprehension  Verbalized understanding;Returned demonstration          OT Long Term Goals - 10/12/18 0855      OT LONG TERM GOAL #6   Title  Pt. will improve left hand coordination skills to be able to button clothing.    Baseline  Pt. continues to present with limited LUE strength, and FMC.    Time  12    Period  Weeks    Status  On-going    Target Date  12/15/18      OT LONG TERM GOAL  #9   Baseline  Pt. will write one sentence with 75% legibility and efficiently while maintainingg grasp on pen 100% of the time.    Time  12    Period  Weeks    Status  On-going    Target Date  12/15/18      OT LONG TERM GOAL  #10   TITLE  Pt. will independently use his left hand to grasp and efficiently accomodate to the varying weights of ADL objects during self care tasks.     Baseline  Pt. continues to work on grasping and adjusting his hand to the varying weights of objects.    Time  12    Period  Weeks    Target Date  12/15/18      OT LONG TERM GOAL  #11   TITLE  Pt. will improve [facility] skills to be able to independently manipulate items for ADLs/IADLs when UEs are elevated when reaching into the closet, or microwave.    Baseline  Pt. continues to have difficulty manipulating objects when UEs elevated     Time  12    Period  Weeks    Status  On-going    Target Date  12/15/18            Plan - 11/04/18 0949    Clinical Impression Statement Pt. reports that he did not play golf as well when he played this week. Pt. continues to make progress with LUE strength, grip strength, pinch strength, motor control, and [facility]  skills. Pt. continues to work on improving hand function skills, and isolated second digit flexion, and extension in order to improve writing, picking up objects, and increasing control of movements during ADLS, and IADLs.    Occupational performance deficits (Please refer to  evaluation for details):  ADL's;IADL's    Rehab Potential  Excellent    Clinical Decision Making  Several treatment options, min-mod task modification necessary    OT Frequency  2x / week    OT Treatment/Interventions  Self-care/ADL training;Therapeutic exercise;Neuromuscular education;Patient/family education;Energy conservation;Therapeutic activities;DME and/or AE instruction    Consulted and Agree with Plan of Care  Patient       Patient will benefit from skilled therapeutic intervention in order to improve the following deficits and impairments:           Visit Diagnosis: 1. Facial weakness   2. Other lack of coordination       Problem List Patient Active Problem List   Diagnosis Date Noted  . Pure hypercholesterolemia 05/28/2017  . Erectile dysfunction 05/28/2017  . Left hemiparesis (HCC)   . Essential hypertension 11/29/2016  . 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 Chronic hepatitis C without hepatic coma (HCC) 07/22/2013    Olegario MessierElaine Verlyn Dannenberg, MS, OTR/L 11/04/2018, 10:01 AM  [place] [facility] [address] [place], [place], [address] Phone: [phone]   Fax:  [phone]  Name: Jason Nolan MRN: [id number] Date of Birth: Nov 05, 1945

## 2018-11-09 ENCOUNTER — Encounter: Payer: Self-pay | Admitting: Occupational Therapy

## 2018-11-09 ENCOUNTER — Other Ambulatory Visit: Payer: Self-pay

## 2018-11-09 ENCOUNTER — Ambulatory Visit: Payer: Medicare Other | Admitting: Occupational Therapy

## 2018-11-09 DIAGNOSIS — M6281 Muscle weakness (generalized): Secondary | ICD-10-CM | POA: Diagnosis not present

## 2018-11-09 DIAGNOSIS — R29810 Facial weakness: Secondary | ICD-10-CM | POA: Diagnosis not present

## 2018-11-09 DIAGNOSIS — R278 Other lack of coordination: Secondary | ICD-10-CM | POA: Diagnosis not present

## 2018-11-09 NOTE — Therapy (Signed)
[place] [facility] [address] [place], [place], [address] Phone: [phone]   Fax:  [phone]  Occupational Therapy Treatment  Patient Details  Name: Jason CroakJames Nolan MRN: [id number] Date of Birth: 1946/02/12 No data recorded  Encounter Date: 11/09/2018  OT End of Session - 11/09/18 0919    Visit Number  128    Number of Visits  144    Date for OT Re-Evaluation  12/15/18    Authorization Type  Progress reporting period starting 10/14/2018    OT Start Time  0915    OT Stop Time  1000    OT Time Calculation (min)  45 min    Activity Tolerance  Patient tolerated treatment well    Behavior During Therapy  [facility] for tasks assessed/performed       Past Medical History:  Diagnosis Date  . Glaucoma   . Hepatitis C   . Stroke [facility](HCC)     Past Surgical History:  Procedure Laterality Date  . COLONOSCOPY WITH PROPOFOL N/A 07/01/2018   Procedure: COLONOSCOPY WITH PROPOFOL;  Surgeon: Wyline MoodAnna, Kiran, MD;  [facility];  Service: Gastroenterology;  Laterality: N/A;  . IR ANGIO INTRA EXTRACRAN SEL COM CAROTID INNOMINATE UNI L MOD SED  11/25/2016  . IR ANGIO VERTEBRAL SEL SUBCLAVIAN INNOMINATE UNI R MOD SED  11/25/2016  . IR ANGIO VERTEBRAL SEL VERTEBRAL UNI L MOD SED  11/25/2016  . IR INTRAVSC STENT CERV CAROTID W/O EMB-PROT MOD SED INC ANGIO  11/25/2016  . IR PERCUTANEOUS ART THROMBECTOMY/INFUSION INTRACRANIAL INC DIAG ANGIO  11/25/2016  . IR RADIOLOGIST EVAL & MGMT  02/13/2017  . RADIOLOGY WITH ANESTHESIA N/A 11/25/2016   Procedure: RADIOLOGY WITH ANESTHESIA;  Surgeon: Radiologist, Medication, MD;  [facility];  Service: Radiology;  Laterality: N/A;    There were no vitals filed for this visit.  Subjective Assessment - 11/09/18 0918    Subjective   Pt. reports having a quiet weekend.    Patient is accompanied by:  Family member    Pertinent History  Pt. is a 73 y.o. 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 [facility] He underwent CTA brain showed short segment near occlusion of proximal R-ICA with associated intraluminal thrombus --likely due to dissection and emergent large vessel occlusion with right M2 occlusion and evolving right frontal lobe infarct.  He underwent cerebral angio with  complete revascularization of occluded superior division of R-MCA and near complete revascularization of proximal R-ICA with stent assisted angioplasty.  Stroke felt to be embolic in setting of ruptured plaque R-ICA and he was placed on ASA and brillinta due to stent. Pt. was transferred to inpatient rehab for several weeks, had home health therapy upon discharge, and now is ready for outpatient therapy services.    Patient Stated Goals  To regain the use of his LUE.    Currently in Pain?  No/denies      OT TREATMENT    Neuro muscular re-education:  Pt. worked on grasping, and positioning magnetic hooks on a whiteboard positioned at a vertical angle on the tabletop. Pt. worked on [facility] skills grasping 1/2" flat washers, and placing them on the hooks. Pt. performed [facility] skills training to improve speed and dexterity needed for ADL tasks and writing. Pt. demonstrated grasping 1 inch sticks,  inch cylindrical collars, and  inch flat washers on the Purdue pegboard. Pt. worked on Geneticist, moleculargrasping each item with his  2nd digit and thumb, and  storing them in the palm. Pt. presented with difficulty storing the sticks in the palmar aspect of his hand. Minimal leaning to the right with the trunk when performing translatory movements of the hand.                           OT Education - 11/09/18 0919    Education provided  Yes    Education Details  fine motor coordination, especially between thumb and index    Methods  Explanation;Demonstration;Verbal cues    Comprehension  Verbalized understanding;Returned demonstration          OT Long Term Goals - 10/12/18 0855      OT LONG TERM GOAL #6    Title  Pt. will improve left hand coordination skills to be able to button clothing.    Baseline  Pt. continues to present with limited LUE strength, and [place].    Time  12    Period  Weeks    Status  On-going    Target Date  12/15/18      OT LONG TERM GOAL  #9   Baseline  Pt. will write one sentence with 75% legibility and efficiently while maintainingg grasp on pen 100% of the time.    Time  12    Period  Weeks    Status  On-going    Target Date  12/15/18      OT LONG TERM GOAL  #10   TITLE  Pt. will independently use his left hand to grasp and efficiently accomodate to the varying weights of ADL objects during self care tasks.     Baseline  Pt. continues to work on grasping and adjusting his hand to the varying weights of objects.    Time  12    Period  Weeks    Target Date  12/15/18      OT LONG TERM GOAL  #11   TITLE  Pt. will improve [facility] skills to be able to independently manipulate items for ADLs/IADLs when UEs are elevated when reaching into the closet, or microwave.    Baseline  Pt. continues to have difficulty manipulating objects when UEs elevated     Time  12    Period  Weeks    Status  On-going    Target Date  12/15/18            Plan - 11/09/18 0930    Clinical Impression Statement  Pt. has difficulty uisng his left hand to pick up 1/2" small flat items from the floor. Pt. is making progress with left hand function skills. Pt. continues to work on improving translatory movements of the hand in order to improve overall ADL, and IADL functioning.    Occupational performance deficits (Please refer to evaluation for details):  ADL's;IADL's    Rehab Potential  Excellent    Clinical Decision Making  Several treatment options, min-mod task modification necessary    OT Frequency  2x / week    OT Duration  12 weeks    OT Treatment/Interventions  Self-care/ADL training;Therapeutic exercise;Neuromuscular education;Patient/family education;Energy conservation;Therapeutic  activities;DME and/or AE instruction    Consulted and Agree with Plan of Care  Patient       Patient will benefit from skilled therapeutic intervention in order to improve the following deficits and impairments:           Visit Diagnosis: 1. Muscle weakness (generalized)   2. Other lack of coordination  Problem List Patient Active Problem List   Diagnosis Date Noted  . Pure hypercholesterolemia 05/28/2017  . Erectile dysfunction 05/28/2017  . Left hemiparesis (HCC)   . Essential hypertension 11/29/2016  . 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 Chronic hepatitis C without hepatic coma (HCC) 07/22/2013    Olegario MessierElaine Rapheal Masso, MS, OTR/L 11/09/2018, 9:38 AM  [place] [facility] MAIN [facility] [address] [place], [place], [address] Phone: [phone]   Fax:  [phone]  Name: Jason CroakJames Nolan MRN: [id number] Date of Birth: [birth year]

## 2018-11-11 ENCOUNTER — Ambulatory Visit: Payer: Medicare Other | Admitting: Occupational Therapy

## 2018-11-11 ENCOUNTER — Other Ambulatory Visit: Payer: Self-pay

## 2018-11-11 ENCOUNTER — Encounter: Payer: Self-pay | Admitting: Occupational Therapy

## 2018-11-11 DIAGNOSIS — R29810 Facial weakness: Secondary | ICD-10-CM | POA: Diagnosis not present

## 2018-11-11 DIAGNOSIS — M6281 Muscle weakness (generalized): Secondary | ICD-10-CM | POA: Diagnosis not present

## 2018-11-11 DIAGNOSIS — R278 Other lack of coordination: Secondary | ICD-10-CM

## 2018-11-11 NOTE — Therapy (Signed)
[place] [facility] [address] [place], [place], [address] Phone: [phone]   Fax:  [phone]  Occupational Therapy Treatment  Patient Details  Name: Jason CroakJames Nolan MRN: [id number] Date of Birth: 10-25-1945 No data recorded  Encounter Date: 11/11/2018  OT End of Session - 11/11/18 0929    Visit Number  129    Number of Visits  144    Date for OT Re-Evaluation  12/15/18    OT Start Time  0920    OT Stop Time  1000    OT Time Calculation (min)  40 min    Activity Tolerance  Patient tolerated treatment well    Behavior During Therapy  [facility] for tasks assessed/performed       Past Medical History:  Diagnosis Date  . Glaucoma   . Hepatitis C   . Stroke [facility](HCC)     Past Surgical History:  Procedure Laterality Date  . COLONOSCOPY WITH PROPOFOL N/A 07/01/2018   Procedure: COLONOSCOPY WITH PROPOFOL;  Surgeon: Wyline MoodAnna, Kiran, MD;  [facility];  Service: Gastroenterology;  Laterality: N/A;  . IR ANGIO INTRA EXTRACRAN SEL COM CAROTID INNOMINATE UNI L MOD SED  11/25/2016  . IR ANGIO VERTEBRAL SEL SUBCLAVIAN INNOMINATE UNI R MOD SED  11/25/2016  . IR ANGIO VERTEBRAL SEL VERTEBRAL UNI L MOD SED  11/25/2016  . IR INTRAVSC STENT CERV CAROTID W/O EMB-PROT MOD SED INC ANGIO  11/25/2016  . IR PERCUTANEOUS ART THROMBECTOMY/INFUSION INTRACRANIAL INC DIAG ANGIO  11/25/2016  . IR RADIOLOGIST EVAL & MGMT  02/13/2017  . RADIOLOGY WITH ANESTHESIA N/A 11/25/2016   Procedure: RADIOLOGY WITH ANESTHESIA;  Surgeon: Radiologist, Medication, MD;  [facility];  Service: Radiology;  Laterality: N/A;    There were no vitals filed for this visit.  Subjective Assessment - 11/11/18 0927    Subjective   Pt. reports that is his left hand feels more numb than usual.    Patient is accompanied by:  Family member    Pertinent History  Pt. is a 73 y.o. 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 [facility] He underwent CTA  brain showed short segment near occlusion of proximal R-ICA with associated intraluminal thrombus --likely due to dissection and emergent large vessel occlusion with right M2 occlusion and evolving right frontal lobe infarct.  He underwent cerebral angio with  complete revascularization of occluded superior division of R-MCA and near complete revascularization of proximal R-ICA with stent assisted angioplasty.  Stroke felt to be embolic in setting of ruptured plaque R-ICA and he was placed on ASA and brillinta due to stent. Pt. was transferred to inpatient rehab for several weeks, had home health therapy upon discharge, and now is ready for outpatient therapy services.    Limitations  Dominant LUE functioning, left hand edema, distal ROM, coordination, and overal strength.    Patient Stated Goals  To regain the use of his LUE.    Currently in Pain?  No/denies      OT TREATMENT    Neuro muscular re-education:  Pt. worked on [facility] skills grasping 2" sticks with his left hand. Emphasis was placed on stabilizing the grasp on the 2" sticks while flexing, and extending his 2nd, and 3rd digit PIP, and DIPs. Pt. Initially attempted with 1/2" circular pegs. Pt. Required in time to complete. Pt. Presents with left hand stiffness.  OT Education - 11/11/18 [phone]    Education provided  Yes    Education Details  fine motor coordination, especially between thumb and index    Person(s) Educated  Patient    Methods  Explanation;Demonstration;Verbal cues    Comprehension  Verbalized understanding;Returned demonstration    Education Details  hand function skills, pen grasp.    Person(s) Educated  Patient    Methods  Explanation;Demonstration    Comprehension  Verbalized understanding;Returned demonstration          OT Long Term Goals - 10/12/18 0855      OT LONG TERM GOAL #6   Title  Pt. will improve left hand coordination skills to be able to button  clothing.    Baseline  Pt. continues to present with limited LUE strength, and [place].    Time  12    Period  Weeks    Status  On-going    Target Date  12/15/18      OT LONG TERM GOAL  #9   Baseline  Pt. will write one sentence with 75% legibility and efficiently while maintainingg grasp on pen 100% of the time.    Time  12    Period  Weeks    Status  On-going    Target Date  12/15/18      OT LONG TERM GOAL  #10   TITLE  Pt. will independently use his left hand to grasp and efficiently accomodate to the varying weights of ADL objects during self care tasks.     Baseline  Pt. continues to work on grasping and adjusting his hand to the varying weights of objects.    Time  12    Period  Weeks    Target Date  12/15/18      OT LONG TERM GOAL  #11   TITLE  Pt. will improve [facility] skills to be able to independently manipulate items for ADLs/IADLs when UEs are elevated when reaching into the closet, or microwave.    Baseline  Pt. continues to have difficulty manipulating objects when UEs elevated     Time  12    Period  Weeks    Status  On-going    Target Date  12/15/18            Plan - 11/11/18 0930    Occupational performance deficits (Please refer to evaluation for details):  ADL's;IADL's    Body Structure / Function / Physical Skills  ADL;IADL;FMC;Strength;Muscle spasms;Coordination    Rehab Potential  Excellent    Clinical Decision Making  Several treatment options, min-mod task modification necessary    OT Frequency  2x / week    OT Duration  12 weeks    OT Treatment/Interventions  Self-care/ADL training;Therapeutic exercise;Neuromuscular education;Patient/family education;Energy conservation;Therapeutic activities;DME and/or AE instruction    Consulted and Agree with Plan of Care  Patient       Patient will benefit from skilled therapeutic intervention in order to improve the following deficits and impairments:   Body Structure / Function / Physical Skills: ADL, IADL, FMC,  Strength, Muscle spasms, Coordination       Visit Diagnosis: 1. Muscle weakness (generalized)   2. Other lack of coordination       Problem List Patient Active Problem List   Diagnosis Date Noted  . Pure hypercholesterolemia 05/28/2017  . Erectile dysfunction 05/28/2017  . Left hemiparesis ([place])   . Essential hypertension 11/29/2016  . Acute ischemic stroke ([place]) - R MCA embolic stroke in setting  of ruptured R ICA plaque w dissection, s/p R MCA stent and thrombectomy 11/25/2016  . Chronic hepatitis C without hepatic coma (HCC) 07/22/2013    Olegario MessierElaine Delisia Mcquiston 11/11/2018, 9:33 AM  [place] [facility] [address] [place], [place], [address] Phone: [phone]   Fax:  [phone]  Name: Jason CroakJames Nolan MRN: [id number] Date of Birth: 1945/06/19

## 2018-11-12 ENCOUNTER — Ambulatory Visit: Payer: Medicare Other

## 2018-11-16 ENCOUNTER — Encounter: Payer: Self-pay | Admitting: Occupational Therapy

## 2018-11-16 ENCOUNTER — Ambulatory Visit: Payer: Medicare Other | Admitting: Occupational Therapy

## 2018-11-16 ENCOUNTER — Other Ambulatory Visit: Payer: Self-pay

## 2018-11-16 DIAGNOSIS — R29810 Facial weakness: Secondary | ICD-10-CM | POA: Diagnosis not present

## 2018-11-16 DIAGNOSIS — M6281 Muscle weakness (generalized): Secondary | ICD-10-CM | POA: Diagnosis not present

## 2018-11-16 DIAGNOSIS — R278 Other lack of coordination: Secondary | ICD-10-CM | POA: Diagnosis not present

## 2018-11-16 NOTE — Therapy (Signed)
[place] [facility] [address] [place], [place], [address] Phone: [phone]   Fax:  [phone]  Occupational Therapy Progress Note  Dates of reporting period  10/14/2018   to   11/16/2018  Patient Details  Name: Jason CroakJames Nolan MRN: [id number] Date of Birth: [birth year] No data recorded  Encounter Date: 11/16/2018  OT End of Session - 11/16/18 0935    Visit Number  130    Number of Visits  144    Date for OT Re-Evaluation  12/15/18    OT Start Time  0915    OT Stop Time  1000    OT Time Calculation (min)  45 min    Activity Tolerance  Patient tolerated treatment well    Behavior During Therapy  [facility] for tasks assessed/performed       Past Medical History:  Diagnosis Date  . Glaucoma   . Hepatitis C   . Stroke [facility](HCC)     Past Surgical History:  Procedure Laterality Date  . COLONOSCOPY WITH PROPOFOL N/A 07/01/2018   Procedure: COLONOSCOPY WITH PROPOFOL;  Surgeon: Wyline MoodAnna, Kiran, MD;  [facility];  Service: Gastroenterology;  Laterality: N/A;  . IR ANGIO INTRA EXTRACRAN SEL COM CAROTID INNOMINATE UNI L MOD SED  11/25/2016  . IR ANGIO VERTEBRAL SEL SUBCLAVIAN INNOMINATE UNI R MOD SED  11/25/2016  . IR ANGIO VERTEBRAL SEL VERTEBRAL UNI L MOD SED  11/25/2016  . IR INTRAVSC STENT CERV CAROTID W/O EMB-PROT MOD SED INC ANGIO  11/25/2016  . IR PERCUTANEOUS ART THROMBECTOMY/INFUSION INTRACRANIAL INC DIAG ANGIO  11/25/2016  . IR RADIOLOGIST EVAL & MGMT  02/13/2017  . RADIOLOGY WITH ANESTHESIA N/A 11/25/2016   Procedure: RADIOLOGY WITH ANESTHESIA;  Surgeon: Radiologist, Medication, MD;  [facility];  Service: Radiology;  Laterality: N/A;    There were no vitals filed for this visit.  Subjective Assessment - 11/16/18 0933    Subjective   Pt. reports less numbness today.    Patient is accompanied by:  Family member    Pertinent History  Pt. is a 73 y.o. 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 Pt. was  [facility] He underwent CTA brain showed short segment near occlusion of proximal R-ICA with associated intraluminal thrombus --likely due to dissection and emergent large vessel occlusion with right M2 occlusion and evolving right frontal lobe infarct.  He underwent cerebral angio with  complete revascularization of occluded superior division of R-MCA and near complete revascularization of proximal R-ICA with stent assisted angioplasty.  Stroke felt to be embolic in setting of ruptured plaque R-ICA and he was placed on ASA and brillinta due to stent. Pt. was transferred to inpatient rehab for several weeks, had home health therapy upon discharge, and now is ready for outpatient therapy services.    Limitations  Dominant LUE functioning, left hand edema, distal ROM, coordination, and overal strength.    Patient Stated Goals  To regain the use of his LUE.    Currently in Pain?  No/denies         [facility] Assessment - 11/16/18 0001      Coordination   Left 9 Hole Peg Test  1 min. & 12 sec.      Hand Function   Right Hand Grip (lbs)  95    Right Hand Lateral Pinch  29 lbs    Right Hand 3 Point Pinch  30 lbs    Left Hand Grip (lbs)  65  Left Hand Lateral Pinch  17 lbs    Left 3 point pinch  13 lbs       OT TREATMENT    Measurements were obtained, and goals were reviewed with the pt.  Neuro muscular re-education:  Pt. worked on grasping, and manipulating 1/8" objects with his right hand, and translatory skills moving them through his hand from the palm to the tip of his 2nd digit, and thumb.                     OT Education - 11/16/18 0935    Education provided  Yes    Education Details  fine motor coordination    Person(s) Educated  Patient    Methods  Explanation;Demonstration;Verbal cues    Comprehension  Verbalized understanding;Returned demonstration    Education Details  hand function    Person(s) Educated  Patient    Methods   Explanation;Demonstration    Comprehension  Returned demonstration;Verbalized understanding          OT Long Term Goals - 11/16/18 0938      OT LONG TERM GOAL #6   Title  Pt. will improve left hand coordination skills to be able to button clothing.    Baseline  Pt. continues to present with limited LUE strength, and FMC.    Time  12    Period  Weeks    Status  On-going    Target Date  12/15/18      OT LONG TERM GOAL  #9   Baseline  Pt. will write one sentence with 75% legibility and efficiently while maintainingg grasp on pen 100% of the time.    Time  12    Period  Weeks    Status  On-going    Target Date  12/15/18      OT LONG TERM GOAL  #10   TITLE  Pt. will independently use his left hand to grasp and efficiently accomodate to the varying weights of ADL objects during self care tasks.     Baseline  Pt. continues to work on grasping and adjusting his hand to the varying weights of objects.    Time  12    Period  Weeks    Status  On-going    Target Date  12/15/18      OT LONG TERM GOAL  #11   TITLE  Pt. will improve [facility] skills to be able to independently manipulate items for ADLs/IADLs when UEs are elevated when reaching into the closet, or microwave.    Baseline  Pt. continues to have difficulty manipulating objects when UEs elevated     Time  12    Period  Weeks    Status  On-going    Target Date  12/15/18            Plan - 11/16/18 [id number]    Clinical Impression Statement  Pt. continues to make progress towards goals. Grip strength, and pinch strength have improved consitently. Pt. has had an increase in left [facility], speed, and dexterity this progress reporting period. Pt. continues to work on improving [facility] skills in order to be able to reach, grasp, and manipulate objects. Pt. continues to work on improving pen grasp during writing limiting writing speed, and legibility.    OT Occupational Profile and History  Comprehensive Assessment- Review of records and extensive  additional review of physical, cognitive, psychosocial history related to current functional performance    Occupational performance deficits (Please refer to  evaluation for details):  ADL's;IADL's    Body Structure / Function / Physical Skills  ADL;IADL;FMC;Strength;Muscle spasms;Coordination    Rehab Potential  Excellent    Clinical Decision Making  Several treatment options, min-mod task modification necessary    OT Frequency  2x / week    OT Duration  12 weeks    OT Treatment/Interventions  Self-care/ADL training;Therapeutic exercise;Neuromuscular education;Patient/family education;Energy conservation;Therapeutic activities;DME and/or AE instruction    Consulted and Agree with Plan of Care  Patient       Patient will benefit from skilled therapeutic intervention in order to improve the following deficits and impairments:   Body Structure / Function / Physical Skills: ADL, IADL, FMC, Strength, Muscle spasms, Coordination       Visit Diagnosis: 1. Muscle weakness (generalized)   2. Other lack of coordination       Problem List Patient Active Problem List   Diagnosis Date Noted  . Pure hypercholesterolemia 05/28/2017  . Erectile dysfunction 05/28/2017  . Left hemiparesis ([place])   . Essential hypertension 11/29/2016  . Acute ischemic stroke ([place]) - R MCA embolic stroke in setting of ruptured R ICA plaque w dissection, s/p R MCA stent and thrombectomy 11/25/2016  . Chronic hepatitis C without hepatic coma ([place]) 07/22/2013    Harrel Carina, MS, OTR/L 11/16/2018, 10:00 AM  [place] MAIN [facility] SERVICES [address] [place], [place], [address] Phone: [phone]   Fax:  [phone]  Name: Jason Nolan MRN: [id number] Date of Birth: [birth year]

## 2018-11-18 ENCOUNTER — Other Ambulatory Visit: Payer: Self-pay

## 2018-11-18 ENCOUNTER — Encounter: Payer: Self-pay | Admitting: Occupational Therapy

## 2018-11-18 ENCOUNTER — Ambulatory Visit: Payer: Medicare Other | Admitting: Occupational Therapy

## 2018-11-18 DIAGNOSIS — R278 Other lack of coordination: Secondary | ICD-10-CM

## 2018-11-18 DIAGNOSIS — R29810 Facial weakness: Secondary | ICD-10-CM | POA: Diagnosis not present

## 2018-11-18 DIAGNOSIS — M6281 Muscle weakness (generalized): Secondary | ICD-10-CM | POA: Diagnosis not present

## 2018-11-18 NOTE — Therapy (Signed)
[place] [facility] MAIN [facility] [address] [place], [place], [address] Phone: [phone]   Fax:  [phone]  Occupational Therapy Treatment  Patient Details  Name: Jason CroakJames Hey MRN: [id number] Date of Birth: Apr 21, 1946 No data recorded  Encounter Date: 11/18/2018  OT End of Session - 11/18/18 0929    Visit Number  131    Number of Visits  144    Date for OT Re-Evaluation  12/15/18    OT Start Time  0930    OT Stop Time  1015    OT Time Calculation (min)  45 min    Activity Tolerance  Patient tolerated treatment well    Behavior During Therapy  [facility] for tasks assessed/performed       Past Medical History:  Diagnosis Date  . Glaucoma   . Hepatitis C   . Stroke [facility](HCC)     Past Surgical History:  Procedure Laterality Date  . COLONOSCOPY WITH PROPOFOL N/A 07/01/2018   Procedure: COLONOSCOPY WITH PROPOFOL;  Surgeon: Wyline MoodAnna, Kiran, MD;  [facility];  Service: Gastroenterology;  Laterality: N/A;  . IR ANGIO INTRA EXTRACRAN SEL COM CAROTID INNOMINATE UNI L MOD SED  11/25/2016  . IR ANGIO VERTEBRAL SEL SUBCLAVIAN INNOMINATE UNI R MOD SED  11/25/2016  . IR ANGIO VERTEBRAL SEL VERTEBRAL UNI L MOD SED  11/25/2016  . IR INTRAVSC STENT CERV CAROTID W/O EMB-PROT MOD SED INC ANGIO  11/25/2016  . IR PERCUTANEOUS ART THROMBECTOMY/INFUSION INTRACRANIAL INC DIAG ANGIO  11/25/2016  . IR RADIOLOGIST EVAL & MGMT  02/13/2017  . RADIOLOGY WITH ANESTHESIA N/A 11/25/2016   Procedure: RADIOLOGY WITH ANESTHESIA;  Surgeon: Radiologist, Medication, MD;  [facility];  Service: Radiology;  Laterality: N/A;    There were no vitals filed for this visit.  Subjective Assessment - 11/18/18 0926    Subjective   Pt. reports that numbness is not as bad today.    Patient is accompanied by:  Family member    Pertinent History  Pt. is a 73 y.o. 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 [facility] He underwent CTA brain showed  short segment near occlusion of proximal R-ICA with associated intraluminal thrombus --likely due to dissection and emergent large vessel occlusion with right M2 occlusion and evolving right frontal lobe infarct.  He underwent cerebral angio with  complete revascularization of occluded superior division of R-MCA and near complete revascularization of proximal R-ICA with stent assisted angioplasty.  Stroke felt to be embolic in setting of ruptured plaque R-ICA and he was placed on ASA and brillinta due to stent. Pt. was transferred to inpatient rehab for several weeks, had home health therapy upon discharge, and now is ready for outpatient therapy services.    Limitations  Dominant LUE functioning, left hand edema, distal ROM, coordination, and overal strength.    Patient Stated Goals  To regain the use of his LUE.    Currently in Pain?  No/denies      OT TREATMENT    Neuro muscular re-education:  Pt. worked on bilateral [facility] skills needed to grasp small resistive beads. Pt. worked on connecting the beads using a 3pt. pinch, and pt. pinch grasp. Pt. worked on disconnecting the resistive beads using a lateral pinch grasp, and 3pt. pinch grasp. Pt. dropped multiple beads from his left hand when attempting to connect the beads.    Therapeutic Exercise:  Pt. worked on left gross grip strengthening with emphasis on distal digit  strengthening using a green rubber band resistance. Pt. worked on digiflex 1.5, and 7.0 lbs. Randomly alternating digits.                          OT Education - 11/18/18 [phone]    Education provided  Yes    Education Details  fine motor coordination    Person(s) Educated  Patient    Methods  Explanation;Demonstration;Verbal cues    Comprehension  Verbalized understanding;Returned demonstration          OT Long Term Goals - 11/16/18 0938      OT LONG TERM GOAL #6   Title  Pt. will improve left hand coordination skills to be able to button clothing.     Baseline  Pt. continues to present with limited LUE strength, and FMC.    Time  12    Period  Weeks    Status  On-going    Target Date  12/15/18      OT LONG TERM GOAL  #9   Baseline  Pt. will write one sentence with 75% legibility and efficiently while maintainingg grasp on pen 100% of the time.    Time  12    Period  Weeks    Status  On-going    Target Date  12/15/18      OT LONG TERM GOAL  #10   TITLE  Pt. will independently use his left hand to grasp and efficiently accomodate to the varying weights of ADL objects during self care tasks.     Baseline  Pt. continues to work on grasping and adjusting his hand to the varying weights of objects.    Time  12    Period  Weeks    Status  On-going    Target Date  12/15/18      OT LONG TERM GOAL  #11   TITLE  Pt. will improve [facility] skills to be able to independently manipulate items for ADLs/IADLs when UEs are elevated when reaching into the closet, or microwave.    Baseline  Pt. continues to have difficulty manipulating objects when UEs elevated     Time  12    Period  Weeks    Status  On-going    Target Date  12/15/18            Plan - 11/18/18 0931    Clinical Impression Statement Pt. continues make excellent progress overall with left hand strength. Pt. continues to work on improving translatory movements of the left hand. Pt. continues to work on improving ADL, and IADL functioning in order to improve UE functioning.    OT Occupational Profile and History  Comprehensive Assessment- Review of records and extensive additional review of physical, cognitive, psychosocial history related to current functional performance    Occupational performance deficits (Please refer to evaluation for details):  ADL's;IADL's    Body Structure / Function / Physical Skills  ADL;IADL;FMC;Strength;Muscle spasms;Coordination    Rehab Potential  Excellent    Clinical Decision Making  Several treatment options, min-mod task modification necessary     OT Frequency  2x / week    OT Duration  12 weeks    OT Treatment/Interventions  Self-care/ADL training;Therapeutic exercise;Neuromuscular education;Patient/family education;Energy conservation;Therapeutic activities;DME and/or AE instruction    Consulted and Agree with Plan of Care  Patient       Patient will benefit from skilled therapeutic intervention in order to improve the following deficits and impairments:  Body Structure / Function / Physical Skills: ADL, IADL, FMC, Strength, Muscle spasms, Coordination       Visit Diagnosis: 1. Muscle weakness (generalized)   2. Other lack of coordination       Problem List Patient Active Problem List   Diagnosis Date Noted  . Pure hypercholesterolemia 05/28/2017  . Erectile dysfunction 05/28/2017  . Left hemiparesis ([place])   . Essential hypertension 11/29/2016  . Acute ischemic stroke ([place]) - R MCA embolic stroke in setting of ruptured R ICA plaque w dissection, s/p R MCA stent and thrombectomy 11/25/2016  . Chronic hepatitis C without hepatic coma ([place]) 07/22/2013    Harrel Carina, MS, OTR/L 11/18/2018, 9:51 AM  [place] MAIN [facility] SERVICES [address] [place], [place], [address] Phone: [phone]   Fax:  [phone]  Name: Jason Nolan MRN: [id number] Date of Birth: 1945-10-15

## 2018-11-23 ENCOUNTER — Other Ambulatory Visit: Payer: Self-pay

## 2018-11-23 ENCOUNTER — Ambulatory Visit: Payer: Medicare Other | Admitting: Occupational Therapy

## 2018-11-23 DIAGNOSIS — M6281 Muscle weakness (generalized): Secondary | ICD-10-CM | POA: Diagnosis not present

## 2018-11-23 DIAGNOSIS — R278 Other lack of coordination: Secondary | ICD-10-CM | POA: Diagnosis not present

## 2018-11-23 DIAGNOSIS — R29810 Facial weakness: Secondary | ICD-10-CM | POA: Diagnosis not present

## 2018-11-25 ENCOUNTER — Encounter: Payer: Self-pay | Admitting: Occupational Therapy

## 2018-11-25 ENCOUNTER — Other Ambulatory Visit: Payer: Self-pay

## 2018-11-25 ENCOUNTER — Ambulatory Visit: Payer: Medicare Other | Admitting: Occupational Therapy

## 2018-11-25 DIAGNOSIS — R278 Other lack of coordination: Secondary | ICD-10-CM

## 2018-11-25 DIAGNOSIS — M6281 Muscle weakness (generalized): Secondary | ICD-10-CM

## 2018-11-25 DIAGNOSIS — R29810 Facial weakness: Secondary | ICD-10-CM | POA: Diagnosis not present

## 2018-11-25 NOTE — Therapy (Addendum)
[place] MAIN [facility] SERVICES [address] [place], [place], [address] Phone: [phone]   Fax:  [phone]  Occupational Therapy Treatment  Patient Details  Name: Jason Nolan MRN: [id number] Date of Birth: 1945-07-03 No data recorded  Encounter Date: 11/25/2018  OT End of Session - 11/25/18 0931    Visit Number  133   Number of Visits  144    Date for OT Re-Evaluation  12/15/18    OT Start Time  0925    OT Stop Time  1010    OT Time Calculation (min)  45 min    Activity Tolerance  Patient tolerated treatment well    Behavior During Therapy  [facility] for tasks assessed/performed       Past Medical History:  Diagnosis Date  . Glaucoma   . Hepatitis C   . Stroke [facility])     Past Surgical History:  Procedure Laterality Date  . COLONOSCOPY WITH PROPOFOL N/A 07/01/2018   Procedure: COLONOSCOPY WITH PROPOFOL;  Surgeon: Jonathon Bellows, MD;  Location: [facility] ENDOSCOPY;  Service: Gastroenterology;  Laterality: N/A;  . IR ANGIO INTRA EXTRACRAN SEL COM CAROTID INNOMINATE UNI L MOD SED  11/25/2016  . IR ANGIO VERTEBRAL SEL SUBCLAVIAN INNOMINATE UNI R MOD SED  11/25/2016  . IR ANGIO VERTEBRAL SEL VERTEBRAL UNI L MOD SED  11/25/2016  . IR INTRAVSC STENT CERV CAROTID W/O EMB-PROT MOD SED INC ANGIO  11/25/2016  . IR PERCUTANEOUS ART THROMBECTOMY/INFUSION INTRACRANIAL INC DIAG ANGIO  11/25/2016  . IR RADIOLOGIST EVAL & MGMT  02/13/2017  . RADIOLOGY WITH ANESTHESIA N/A 11/25/2016   Procedure: RADIOLOGY WITH ANESTHESIA;  Surgeon: Radiologist, Medication, MD;  Location: [place];  Service: Radiology;  Laterality: N/A;    There were no vitals filed for this visit.  Subjective Assessment - 11/25/18 0928    Subjective   Pt. reports that he is doing well    Patient is accompanied by:  Family member    Pertinent History  Pt. is a 73 y.o. 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 Pt. was transferred to [facility]. He underwent CTA brain showed short segment  near occlusion of proximal R-ICA with associated intraluminal thrombus --likely due to dissection and emergent large vessel occlusion with right M2 occlusion and evolving right frontal lobe infarct.  He underwent cerebral angio with  complete revascularization of occluded superior division of R-MCA and near complete revascularization of proximal R-ICA with stent assisted angioplasty.  Stroke felt to be embolic in setting of ruptured plaque R-ICA and he was placed on ASA and brillinta due to stent. Pt. was transferred to inpatient rehab for several weeks, had home health therapy upon discharge, and now is ready for outpatient therapy services.    Currently in Pain?  No/denies     OT TREATMENT    Neuromuscular re-education:   Pt. worked on moving 1"x 4" peg through his hand distally to proximally with the tips of his digits.   Selfcare:  Pt. worked on Estate agent in printed, and cursive form copying multiple sentence paragraphs. Pt. was able to stabilize a standard pen more consistently today during printing. Pt. adjusted the standard pen with his right hand only a few times when printing, however had to adjust it frequently during cursive writing. The pen was changed out to a pen with an adaptive foam handle. 100% legibility with printing with increased time, and minimal deviation under the line. 50% legibility with cursive writing with deviation under the line.  OT Education - 11/25/18 0931    Education provided  Yes    Education Details  fine motor coordination    Person(s) Educated  Patient    Methods  Explanation;Demonstration;Verbal cues    Comprehension  Verbalized understanding;Returned demonstration          OT Long Term Goals - 11/16/18 0938      OT LONG TERM GOAL #6   Title  Pt. will improve left hand coordination skills to be able to button clothing.    Baseline  Pt. continues to present with limited LUE strength, and FMC.    Time   12    Period  Weeks    Status  On-going    Target Date  12/15/18      OT LONG TERM GOAL  #9   Baseline  Pt. will write one sentence with 75% legibility and efficiently while maintainingg grasp on pen 100% of the time.    Time  12    Period  Weeks    Status  On-going    Target Date  12/15/18      OT LONG TERM GOAL  #10   TITLE  Pt. will independently use his left hand to grasp and efficiently accomodate to the varying weights of ADL objects during self care tasks.     Baseline  Pt. continues to work on grasping and adjusting his hand to the varying weights of objects.    Time  12    Period  Weeks    Status  On-going    Target Date  12/15/18      OT LONG TERM GOAL  #11   TITLE  Pt. will improve [facility] skills to be able to independently manipulate items for ADLs/IADLs when UEs are elevated when reaching into the closet, or microwave.    Baseline  Pt. continues to have difficulty manipulating objects when UEs elevated     Time  12    Period  Weeks    Status  On-going    Target Date  12/15/18            Plan - 11/25/18 0932    Clinical Impression Statement Pt. is making progress with his LUE, and hand. Pt. is now able to stabilize a pen more consistently when writing, however requires increased time to complete. Pt. continues to present with left UE stiffness, and impaired motor control, and [facility] skills. Pt. continues to work on improving LUE functioning in preparation for improved functional use duirng ADLs, and IADL functioning.    OT Occupational Profile and History  Comprehensive Assessment- Review of records and extensive additional review of physical, cognitive, psychosocial history related to current functional performance    Occupational performance deficits (Please refer to evaluation for details):  ADL's;IADL's    Body Structure / Function / Physical Skills  ADL;IADL;FMC;Strength;Muscle spasms;Coordination    Rehab Potential  Excellent    Clinical Decision Making  Several  treatment options, min-mod task modification necessary    OT Frequency  2x / week    OT Duration  12 weeks    OT Treatment/Interventions  Self-care/ADL training;Therapeutic exercise;Neuromuscular education;Patient/family education;Energy conservation;Therapeutic activities;DME and/or AE instruction    Consulted and Agree with Plan of Care  Patient       Patient will benefit from skilled therapeutic intervention in order to improve the following deficits and impairments:   Body Structure / Function / Physical Skills: ADL, IADL, FMC, Strength, Muscle spasms, Coordination  Visit Diagnosis: 1. Muscle weakness (generalized)   2. Other lack of coordination       Problem List Patient Active Problem List   Diagnosis Date Noted  . Pure hypercholesterolemia 05/28/2017  . Erectile dysfunction 05/28/2017  . Left hemiparesis (HCC)   . Essential hypertension 11/29/2016  . 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 Chronic hepatitis C without hepatic coma (HCC) 07/22/2013    Olegario MessierElaine Tniya Bowditch, MS, OTR/L 11/25/2018, 9:47 AM  [place] [facility] MAIN [facility] [address] [place], [place], [address] Phone: [phone]   Fax:  [phone]  Name: Jason Nolan MRN: [id number] Date of Birth: 02-Apr-1946

## 2018-11-26 ENCOUNTER — Encounter: Payer: Self-pay | Admitting: Occupational Therapy

## 2018-11-26 NOTE — Therapy (Signed)
[place] [facility] MAIN [facility] [address] [place], [place], [address] Phone: [phone]   Fax:  [phone]  Occupational Therapy Treatment  Patient Details  Name: Jason CroakJames Nolan MRN: [id number] Date of Birth: 1946/03/10 No data recorded  Encounter Date: 11/23/2018  OT End of Session - 11/26/18 1310    Visit Number  132    Number of Visits  144    Date for OT Re-Evaluation  12/15/18    Authorization Type  Progress reporting period starting 10/14/2018    OT Start Time  0915    OT Stop Time  1000    OT Time Calculation (min)  45 min    Activity Tolerance  Patient tolerated treatment well    Behavior During Therapy  [facility] for tasks assessed/performed       Past Medical History:  Diagnosis Date  . Glaucoma   . Hepatitis C   . Stroke [facility](HCC)     Past Surgical History:  Procedure Laterality Date  . COLONOSCOPY WITH PROPOFOL N/A 07/01/2018   Procedure: COLONOSCOPY WITH PROPOFOL;  Surgeon: Wyline MoodAnna, Kiran, MD;  [facility];  Service: Gastroenterology;  Laterality: N/A;  . IR ANGIO INTRA EXTRACRAN SEL COM CAROTID INNOMINATE UNI L MOD SED  11/25/2016  . IR ANGIO VERTEBRAL SEL SUBCLAVIAN INNOMINATE UNI R MOD SED  11/25/2016  . IR ANGIO VERTEBRAL SEL VERTEBRAL UNI L MOD SED  11/25/2016  . IR INTRAVSC STENT CERV CAROTID W/O EMB-PROT MOD SED INC ANGIO  11/25/2016  . IR PERCUTANEOUS ART THROMBECTOMY/INFUSION INTRACRANIAL INC DIAG ANGIO  11/25/2016  . IR RADIOLOGIST EVAL & MGMT  02/13/2017  . RADIOLOGY WITH ANESTHESIA N/A 11/25/2016   Procedure: RADIOLOGY WITH ANESTHESIA;  Surgeon: Radiologist, Medication, MD;  [facility] OR;  Service: Radiology;  Laterality: N/A;    There were no vitals filed for this visit.  Subjective Assessment - 11/26/18 1309    Subjective   Patient reports he "trusts" his lett hand more, dropping things less frequently.  Golf game hasn't changed much but he is aware more that he needs to loosen up on his grip.     Pertinent History  Pt. is a 73 y.o. 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 [facility] He underwent CTA brain showed short segment near occlusion of proximal R-ICA with associated intraluminal thrombus --likely due to dissection and emergent large vessel occlusion with right M2 occlusion and evolving right frontal lobe infarct.  He underwent cerebral angio with  complete revascularization of occluded superior division of R-MCA and near complete revascularization of proximal R-ICA with stent assisted angioplasty.  Stroke felt to be embolic in setting of ruptured plaque R-ICA and he was placed on ASA and brillinta due to stent. Pt. was transferred to inpatient rehab for several weeks, had home health therapy upon discharge, and now is ready for outpatient therapy services.    Limitations  Dominant LUE functioning, left hand edema, distal ROM, coordination, and overal strength.    Patient Stated Goals  To regain the use of his LUE.    Currently in Pain?  No/denies    Pain Score  0-No pain       Patient reports when he is trying to cut something, his thumb doesn't want to work , tends to wrap around the knife instead of being able to control it.   Neuromuscular Reeducation:  Grip strength reassessed on left : 62# Patient able to demonstrate Opposition to all fingers now,  able to go to the base of the small finger with effort.   Patient seen for manipulation and moving fingers along Long dowel moving up and down with increased effort and cues, patient lacks index manipulation which causes an imbalance with the task. Patient seen for cutting meat with cues for finger and hand placement, gripping patterns.  Recommend he use a larger handle, place built up red foam on hand for patient to try and had improved success at grip,  Patient seen for Handwriting skills with left dominant hand with emphasis on pen grasp and legibility. Handwriting with emphasis on isolated index and  thumb movements for pushing forward and pulling back to make the stroke.  Performing Clicking pen with thumb at top and index at top and alternating for coordination skills Pinch and pull with attempts to keep index and thumb in opposition, difficulty with thumb to fully oppose while pulling, tends to revert to a lateral pinch.  Response to tx:  Patient continues to demonstrate difficulty with isolated finger movements especially with the index finger and thumb which affects the quality of movements for handwriting and utensil use.  Focusing on targeting these muscles and working towards reeducation of fine motor control to impact daily activities.  Continue to work towards goals in plan of care, patient instructed on exercises to perform at home over the next few days and demos understanding.                     OT Education - 11/26/18 1309    Education Details  fine motor coordination    Person(s) Educated  Patient    Methods  Explanation;Demonstration;Verbal cues    Comprehension  Verbalized understanding;Returned demonstration          OT Long Term Goals - 11/16/18 0938      OT LONG TERM GOAL #6   Title  Pt. will improve left hand coordination skills to be able to button clothing.    Baseline  Pt. continues to present with limited LUE strength, and FMC.    Time  12    Period  Weeks    Status  On-going    Target Date  12/15/18      OT LONG TERM GOAL  #9   Baseline  Pt. will write one sentence with 75% legibility and efficiently while maintainingg grasp on pen 100% of the time.    Time  12    Period  Weeks    Status  On-going    Target Date  12/15/18      OT LONG TERM GOAL  #10   TITLE  Pt. will independently use his left hand to grasp and efficiently accomodate to the varying weights of ADL objects during self care tasks.     Baseline  Pt. continues to work on grasping and adjusting his hand to the varying weights of objects.    Time  12    Period  Weeks     Status  On-going    Target Date  12/15/18      OT LONG TERM GOAL  #11   TITLE  Pt. will improve [facility] skills to be able to independently manipulate items for ADLs/IADLs when UEs are elevated when reaching into the closet, or microwave.    Baseline  Pt. continues to have difficulty manipulating objects when UEs elevated     Time  12    Period  Weeks    Status  On-going  Target Date  12/15/18            Plan - 11/26/18 1310    Clinical Impression Statement  Patient continues to demonstrate difficulty with isolated finger movements especially with the index finger and thumb which affects the quality of movements for handwriting and utensil use.  Focusing on targeting these muscles and working towards reeducation of fine motor control to impact daily activities.  Continue to work towards goals in plan of care, patient instructed on exercises to perform at home over the next few days and demos understanding.    OT Occupational Profile and History  Comprehensive Assessment- Review of records and extensive additional review of physical, cognitive, psychosocial history related to current functional performance    Occupational performance deficits (Please refer to evaluation for details):  ADL's;IADL's    Body Structure / Function / Physical Skills  ADL;IADL;FMC;Strength;Muscle spasms;Coordination    Clinical Decision Making  Several treatment options, min-mod task modification necessary    OT Frequency  2x / week    OT Duration  12 weeks    OT Treatment/Interventions  Self-care/ADL training;Therapeutic exercise;Neuromuscular education;Patient/family education;Energy conservation;Therapeutic activities;DME and/or AE instruction    Consulted and Agree with Plan of Care  Patient       Patient will benefit from skilled therapeutic intervention in order to improve the following deficits and impairments:   Body Structure / Function / Physical Skills: ADL, IADL, FMC, Strength, Muscle spasms,  Coordination       Visit Diagnosis: 1. Other lack of coordination   2. Muscle weakness (generalized)       Problem List Patient Active Problem List   Diagnosis Date Noted  . Pure hypercholesterolemia 05/28/2017  . Erectile dysfunction 05/28/2017  . Left hemiparesis ([place])   . Essential hypertension 11/29/2016  . Acute ischemic stroke ([place]) - R MCA embolic stroke in setting of ruptured R ICA plaque w dissection, s/p R MCA stent and thrombectomy 11/25/2016  . Chronic hepatitis C without hepatic coma ([place]) 07/22/2013   Jason Nolan, OTR/L, CLT  Kelty Szafran 11/26/2018, 1:16 PM  [place] MAIN [facility] SERVICES [address] [place], [place], [address] Phone: [phone]   Fax:  [phone]  Name: Jason Nolan MRN: [id number] Date of Birth: [birth year]

## 2018-11-30 ENCOUNTER — Ambulatory Visit: Payer: Medicare Other | Admitting: Occupational Therapy

## 2018-11-30 ENCOUNTER — Other Ambulatory Visit: Payer: Self-pay

## 2018-11-30 ENCOUNTER — Encounter: Payer: Self-pay | Admitting: Occupational Therapy

## 2018-11-30 DIAGNOSIS — M6281 Muscle weakness (generalized): Secondary | ICD-10-CM | POA: Diagnosis not present

## 2018-11-30 DIAGNOSIS — R29810 Facial weakness: Secondary | ICD-10-CM | POA: Diagnosis not present

## 2018-11-30 DIAGNOSIS — R278 Other lack of coordination: Secondary | ICD-10-CM | POA: Diagnosis not present

## 2018-11-30 NOTE — Therapy (Addendum)
[place] [facility] MAIN [facility] [address] [place], [place], [address] Phone: [phone]   Fax:  [phone]  Occupational Therapy Treatment  Patient Details  Name: Jason Nolan MRN: [id number] Date of Birth: 1946/05/05 No data recorded  Encounter Date: 11/30/2018  OT End of Session - 11/30/18 0924    Visit Number  134   Number of Visits  144    Date for OT Re-Evaluation  12/15/18    OT Start Time  0915    OT Stop Time  1000    OT Time Calculation (min)  45 min    Activity Tolerance  Patient tolerated treatment well    Behavior During Therapy  [facility] for tasks assessed/performed       Past Medical History:  Diagnosis Date  . Glaucoma   . Hepatitis C   . Stroke [facility](HCC)     Past Surgical History:  Procedure Laterality Date  . COLONOSCOPY WITH PROPOFOL N/A 07/01/2018   Procedure: COLONOSCOPY WITH PROPOFOL;  Surgeon: Wyline MoodAnna, Kiran, MD;  [facility];  Service: Gastroenterology;  Laterality: N/A;  . IR ANGIO INTRA EXTRACRAN SEL COM CAROTID INNOMINATE UNI L MOD SED  11/25/2016  . IR ANGIO VERTEBRAL SEL SUBCLAVIAN INNOMINATE UNI R MOD SED  11/25/2016  . IR ANGIO VERTEBRAL SEL VERTEBRAL UNI L MOD SED  11/25/2016  . IR INTRAVSC STENT CERV CAROTID W/O EMB-PROT MOD SED INC ANGIO  11/25/2016  . IR PERCUTANEOUS ART THROMBECTOMY/INFUSION INTRACRANIAL INC DIAG ANGIO  11/25/2016  . IR RADIOLOGIST EVAL & MGMT  02/13/2017  . RADIOLOGY WITH ANESTHESIA N/A 11/25/2016   Procedure: RADIOLOGY WITH ANESTHESIA;  Surgeon: Radiologist, Medication, MD;  [facility];  Service: Radiology;  Laterality: N/A;    There were no vitals filed for this visit.  Subjective Assessment - 11/30/18 0922    Subjective   Pt. reports that his left hand is not as stiff today.    Patient is accompanied by:  Family member    Pertinent History  Pt. is a 73 y.o. 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 [facility] He underwent CTA brain  showed short segment near occlusion of proximal R-ICA with associated intraluminal thrombus --likely due to dissection and emergent large vessel occlusion with right M2 occlusion and evolving right frontal lobe infarct.  He underwent cerebral angio with  complete revascularization of occluded superior division of R-MCA and near complete revascularization of proximal R-ICA with stent assisted angioplasty.  Stroke felt to be embolic in setting of ruptured plaque R-ICA and he was placed on ASA and brillinta due to stent. Pt. was transferred to inpatient rehab for several weeks, had home health therapy upon discharge, and now is ready for outpatient therapy services.    Limitations  Dominant LUE functioning, left hand edema, distal ROM, coordination, and overal strength.    Patient Stated Goals  To regain the use of his LUE.    Currently in Pain?  No/denies      OT TREATMENT    Neuro muscular re-education:  Pt. worked on Left FMC, and translatory movements of the hand grasping 1/4" pegs, storing them in his hand, and moving them through his hand from the palm to the tip of his 2nd digit to place them in the pegboard. Pt. dropped several pegs when moving them through his hand to the tip of his 2nd digit, and thumb.   Selfcare:  Pt. worked on Mining engineerwriting skills with the left hand  using a standard pen. Pt. legibility was 75% with no deviation from the line, however required increased time to complete sentences. Pt. was able to stabilize the pen better today, with less shifting of the pen in his hand.                         OT Education - 11/30/18 [phone]    Education provided  Yes    Person(s) Educated  Patient    Methods  Explanation;Demonstration;Verbal cues    Comprehension  Verbalized understanding;Returned demonstration          OT Long Term Goals - 11/16/18 0938      OT LONG TERM GOAL #6   Title  Pt. will improve left hand coordination skills to be able to button  clothing.    Baseline  Pt. continues to present with limited LUE strength, and [place].    Time  12    Period  Weeks    Status  On-going    Target Date  12/15/18      OT LONG TERM GOAL  #9   Baseline  Pt. will write one sentence with 75% legibility and efficiently while maintainingg grasp on pen 100% of the time.    Time  12    Period  Weeks    Status  On-going    Target Date  12/15/18      OT LONG TERM GOAL  #10   TITLE  Pt. will independently use his left hand to grasp and efficiently accomodate to the varying weights of ADL objects during self care tasks.     Baseline  Pt. continues to work on grasping and adjusting his hand to the varying weights of objects.    Time  12    Period  Weeks    Status  On-going    Target Date  12/15/18      OT LONG TERM GOAL  #11   TITLE  Pt. will improve [facility] skills to be able to independently manipulate items for ADLs/IADLs when UEs are elevated when reaching into the closet, or microwave.    Baseline  Pt. continues to have difficulty manipulating objects when UEs elevated     Time  12    Period  Weeks    Status  On-going    Target Date  12/15/18            Plan - 11/30/18 0924    Clinical Impression Statement Pt. reports that the stiffness in his left hand has decreased. Pt. does not have pain today. Pt. continues to present with limited left hand [facility] skills, and translatory movements of the hand. Pt. conintues to work on these skills in order to improve his ability to [employer] during ADLs, and IADLs.    OT Occupational Profile and History  Comprehensive Assessment- Review of records and extensive additional review of physical, cognitive, psychosocial history related to current functional performance    Occupational performance deficits (Please refer to evaluation for details):  ADL's;IADL's    Body Structure / Function / Physical Skills  ADL;IADL;FMC;Strength;Muscle spasms;Coordination    Rehab Potential  Excellent    Clinical  Decision Making  Several treatment options, min-mod task modification necessary    OT Frequency  2x / week    OT Duration  12 weeks    OT Treatment/Interventions  Self-care/ADL training;Therapeutic exercise;Neuromuscular education;Patient/family education;Energy conservation;Therapeutic activities;DME and/or AE instruction    Consulted and Agree with Plan of Care  Patient       Patient will benefit from skilled therapeutic intervention in order to improve the following deficits and impairments:   Body Structure / Function / Physical Skills: ADL, IADL, FMC, Strength, Muscle spasms, Coordination       Visit Diagnosis: 1. Muscle weakness (generalized)   2. Other lack of coordination       Problem List Patient Active Problem List   Diagnosis Date Noted  . Pure hypercholesterolemia 05/28/2017  . Erectile dysfunction 05/28/2017  . Left hemiparesis (HCC)   . Essential hypertension 11/29/2016  . 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 Chronic hepatitis C without hepatic coma (HCC) 07/22/2013    Olegario MessierElaine Melitza Metheny, MS, OTR/L 11/30/2018, 9:42 AM  [place] [facility] MAIN [facility] [address] [place], [place], [address] Phone: [phone]   Fax:  [phone]  Name: Jason Nolan MRN: [id number] Date of Birth: 03/10/1946

## 2018-12-02 ENCOUNTER — Ambulatory Visit: Payer: Medicare Other | Admitting: Occupational Therapy

## 2018-12-02 ENCOUNTER — Encounter: Payer: Self-pay | Admitting: Occupational Therapy

## 2018-12-02 ENCOUNTER — Other Ambulatory Visit: Payer: Self-pay

## 2018-12-02 DIAGNOSIS — M6281 Muscle weakness (generalized): Secondary | ICD-10-CM | POA: Diagnosis not present

## 2018-12-02 DIAGNOSIS — R278 Other lack of coordination: Secondary | ICD-10-CM

## 2018-12-02 DIAGNOSIS — R29810 Facial weakness: Secondary | ICD-10-CM | POA: Diagnosis not present

## 2018-12-02 NOTE — Therapy (Signed)
[facility]IN [facility] [address] [place], [place], [address] Phone: [phone]   Fax:  [phone]  Occupational Therapy Treatment  Patient Details  Name: Jason CroakJames Nolan MRN: [id number] Date of Birth: 1945/09/02 No data recorded  Encounter Date: 12/02/2018  OT End of Session - 12/02/18 0929    Visit Number  135    Number of Visits  144    Date for OT Re-Evaluation  12/15/18    Authorization Type  Progress reporting period starting 10/14/2018    OT Start Time  0915    OT Stop Time  1000    OT Time Calculation (min)  45 min    Activity Tolerance  Patient tolerated treatment well    Behavior During Therapy  [facility] for tasks assessed/performed       Past Medical History:  Diagnosis Date  . Glaucoma   . Hepatitis C   . Stroke [facility](HCC)     Past Surgical History:  Procedure Laterality Date  . COLONOSCOPY WITH PROPOFOL N/A 07/01/2018   Procedure: COLONOSCOPY WITH PROPOFOL;  Surgeon: Wyline MoodAnna, Kiran, MD;  [facility];  Service: Gastroenterology;  Laterality: N/A;  . IR ANGIO INTRA EXTRACRAN SEL COM CAROTID INNOMINATE UNI L MOD SED  11/25/2016  . IR ANGIO VERTEBRAL SEL SUBCLAVIAN INNOMINATE UNI R MOD SED  11/25/2016  . IR ANGIO VERTEBRAL SEL VERTEBRAL UNI L MOD SED  11/25/2016  . IR INTRAVSC STENT CERV CAROTID W/O EMB-PROT MOD SED INC ANGIO  11/25/2016  . IR PERCUTANEOUS ART THROMBECTOMY/INFUSION INTRACRANIAL INC DIAG ANGIO  11/25/2016  . IR RADIOLOGIST EVAL & MGMT  02/13/2017  . RADIOLOGY WITH ANESTHESIA N/A 11/25/2016   Procedure: RADIOLOGY WITH ANESTHESIA;  Surgeon: Radiologist, Medication, MD;  Location: [facility] OR;  Service: Radiology;  Laterality: N/A;    There were no vitals filed for this visit.  Subjective Assessment - 12/02/18 0928    Subjective   Pt. reports that stiffness has improvied in his left hand.    Patient is accompanied by:  Family member    Pertinent History  Pt. is a 73 y.o. male who sustained a CVA with Left  sided hemiparesis. [facility] He underwent CTA brain showed short segment near occlusion of proximal R-ICA with associated intraluminal thrombus --likely due to dissection and emergent large vessel occlusion with right M2 occlusion and evolving right frontal lobe infarct.  He underwent cerebral angio with  complete revascularization of occluded superior division of R-MCA and near complete revascularization of proximal R-ICA with stent assisted angioplasty.  Stroke felt to be embolic in setting of ruptured plaque R-ICA and he was placed on ASA and brillinta due to stent. Pt. was transferred to inpatient rehab for several weeks, had home health therapy upon discharge, and now is ready for outpatient therapy services.      OT TREATMENT    Neuro muscular re-education:  Pt. worked on using his 2nd and 3rd digit to move a 1/2" by 3" peg through his hand distal to proximal. Pt. worked on Comptrollertranslatory skills with the left hand using 1/2" marbles moving the marbles through his hand from his palm to the tip of his 2nd digit, and thumb while extending his wrist. Pt. dropped several marbles when moving them through his left hand.   Selfcare:  Pt. worked on Mining engineerwriting skills with his left hand using a standard pen. Pt. legibility was 75% for cursive with minimal deviation below the line, however required increased time to  complete sentences. Pt. was able to stabilize the pen better today, with less shifting of the pen in his hand. Pt. utilized a modified hand position when writing.  Occasional repositioning of the pen in his hand. Pt. Wrote 7 lines in 18 min. & 7 sec.                              OT Education - 12/02/18 [phone]    Education provided  Yes    Education Details  fine motor coordination    Person(s) Educated  Patient    Methods  Explanation;Demonstration;Verbal cues    Comprehension  Verbalized understanding;Returned demonstration          OT  Long Term Goals - 11/16/18 0938      OT LONG TERM GOAL #6   Title  Pt. will improve left hand coordination skills to be able to button clothing.    Baseline  Pt. continues to present with limited LUE strength, and FMC.    Time  12    Period  Weeks    Status  On-going    Target Date  12/15/18      OT LONG TERM GOAL  #9   Baseline  Pt. will write one sentence with 75% legibility and efficiently while maintainingg grasp on pen 100% of the time.    Time  12    Period  Weeks    Status  On-going    Target Date  12/15/18      OT LONG TERM GOAL  #10   TITLE  Pt. will independently use his left hand to grasp and efficiently accomodate to the varying weights of ADL objects during self care tasks.     Baseline  Pt. continues to work on grasping and adjusting his hand to the varying weights of objects.    Time  12    Period  Weeks    Status  On-going    Target Date  12/15/18      OT LONG TERM GOAL  #11   TITLE  Pt. will improve [facility] skills to be able to independently manipulate items for ADLs/IADLs when UEs are elevated when reaching into the closet, or microwave.    Baseline  Pt. continues to have difficulty manipulating objects when UEs elevated     Time  12    Period  Weeks    Status  On-going    Target Date  12/15/18            Plan - 12/02/18 0930    Clinical Impression Statement Pt. continues to present with decreased left hand stiffness, Pt. is improving with LUE strength, and FMC skills. Pt. has improved, and increased engagement of the left hand during ADL, and IADL tasks. Pt. continues to work on improving hand function skills, translatory movements of the hand, and [facility] skills in order to improve ADL, IADL functioning, and writing.    OT Occupational Profile and History  Comprehensive Assessment- Review of records and extensive additional review of physical, cognitive, psychosocial history related to current functional performance    Occupational performance deficits (Please  refer to evaluation for details):  ADL's;IADL's    Body Structure / Function / Physical Skills  ADL;IADL;FMC;Strength;Muscle spasms;Coordination    Rehab Potential  Excellent    Clinical Decision Making  Several treatment options, min-mod task modification necessary    OT Frequency  2x / week    OT Duration  12 weeks    OT Treatment/Interventions  Self-care/ADL training;Therapeutic exercise;Neuromuscular education;Patient/family education;Energy conservation;Therapeutic activities;DME and/or AE instruction    Consulted and Agree with Plan of Care  Patient       Patient will benefit from skilled therapeutic intervention in order to improve the following deficits and impairments:   Body Structure / Function / Physical Skills: ADL, IADL, FMC, Strength, Muscle spasms, Coordination       Visit Diagnosis: 1. Muscle weakness (generalized)   2. Other lack of coordination       Problem List Patient Active Problem List   Diagnosis Date Noted  . Pure hypercholesterolemia 05/28/2017  . Erectile dysfunction 05/28/2017  . Left hemiparesis ([place])   . Essential hypertension 11/29/2016  . Acute ischemic stroke ([place]) - R MCA embolic stroke in setting of ruptured R ICA plaque w dissection, s/p R MCA stent and thrombectomy 11/25/2016  . Chronic hepatitis C without hepatic coma ([place]) 07/22/2013    Harrel Carina 12/02/2018, 9:55 AM  [place] MAIN [facility] SERVICES [address] [place], [place], [address] Phone: [phone]   Fax:  [phone]  Name: Jason Nolan MRN: [id number] Date of Birth: 1945/08/01

## 2018-12-07 ENCOUNTER — Encounter: Payer: Self-pay | Admitting: Occupational Therapy

## 2018-12-07 ENCOUNTER — Other Ambulatory Visit: Payer: Self-pay

## 2018-12-07 ENCOUNTER — Ambulatory Visit: Payer: Medicare Other | Attending: Internal Medicine | Admitting: Occupational Therapy

## 2018-12-07 DIAGNOSIS — R278 Other lack of coordination: Secondary | ICD-10-CM | POA: Diagnosis not present

## 2018-12-07 DIAGNOSIS — M6281 Muscle weakness (generalized): Secondary | ICD-10-CM | POA: Diagnosis not present

## 2018-12-08 NOTE — Therapy (Signed)
[place] [facility] MA[facility] [address] [place], [place], [address] Phone: [phone]   Fax:  [phone]  Occupational Therapy Treatment  Patient Details  Name: Jason CroakJames Nolan MRN: [id number] Date of Birth: 03/14/1946 No data recorded  Encounter Date: 12/07/2018  OT End of Session - 12/08/18 1607    Visit Number  136    Number of Visits  144    Date for OT Re-Evaluation  12/15/18    Authorization Type  Progress reporting period starting 10/14/2018    OT Start Time  0930    OT Stop Time  1015    OT Time Calculation (min)  45 min    Activity Tolerance  Patient tolerated treatment well    Behavior During Therapy  [facility]WFL for tasks assessed/performed       Past Medical History:  Diagnosis Date  . Glaucoma   . Hepatitis C   . Stroke [facility](HCC)     Past Surgical History:  Procedure Laterality Date  . COLONOSCOPY WITH PROPOFOL N/A 07/01/2018   Procedure: COLONOSCOPY WITH PROPOFOL;  Surgeon: Wyline MoodAnna, Kiran, MD;  [facility];  Service: Gastroenterology;  Laterality: N/A;  . IR ANGIO INTRA EXTRACRAN SEL COM CAROTID INNOMINATE UNI L MOD SED  11/25/2016  . IR ANGIO VERTEBRAL SEL SUBCLAVIAN INNOMINATE UNI R MOD SED  11/25/2016  . IR ANGIO VERTEBRAL SEL VERTEBRAL UNI L MOD SED  11/25/2016  . IR INTRAVSC STENT CERV CAROTID W/O EMB-PROT MOD SED INC ANGIO  11/25/2016  . IR PERCUTANEOUS ART THROMBECTOMY/INFUSION INTRACRANIAL INC DIAG ANGIO  11/25/2016  . IR RADIOLOGIST EVAL & MGMT  02/13/2017  . RADIOLOGY WITH ANESTHESIA N/A 11/25/2016   Procedure: RADIOLOGY WITH ANESTHESIA;  Surgeon: Radiologist, Medication, MD;  [facility] OR;  Service: Radiology;  Laterality: N/A;    There were no vitals filed for this visit.  Subjective Assessment - 12/08/18 1609    Subjective   Patient reports he hasn't seen much changes lately.    Pertinent History  Pt. is a 73 y.o. 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 [facility] He  underwent CTA brain showed short segment near occlusion of proximal R-ICA with associated intraluminal thrombus --likely due to dissection and emergent large vessel occlusion with right M2 occlusion and evolving right frontal lobe infarct.  He underwent cerebral angio with  complete revascularization of occluded superior division of R-MCA and near complete revascularization of proximal R-ICA with stent assisted angioplasty.  Stroke felt to be embolic in setting of ruptured plaque R-ICA and he was placed on ASA and brillinta due to stent. Pt. was transferred to inpatient rehab for several weeks, had home health therapy upon discharge, and now is ready for outpatient therapy services.    Limitations  Dominant LUE functioning, left hand edema, distal ROM, coordination, and overal strength.    Patient Stated Goals  To regain the use of his LUE.    Currently in Pain?  No/denies    Pain Score  0-No pain        Neuromuscular Reeducation: Patient seen this date for focus on fine motor coordination/manipulation skills with emphasis on isolated finger movements.  Patient instructed on a variety of exercises to target movements between thumb and index as well as tripod grasp on utensils, pen and tools.  Patient lacks isolated movements to control tools and tends to grasp and then use wrist and shoulder to produce movement patterns.  Worked on moving fingers up and  down a long object, rolling pen between fingers, tripod grasp with moving fingers forwards and back.  Opening pen cap with right hand only, grasping pen top with index and thumb to push off, then flipping to other end to use ulnar side of the hand to remove top.  Therapist demonstration and verbal cues for all exercise.  Handwriting with use of dycem at the end of the pen to stabilize the fingers, cues for attempts to use isolated movements to produce print and script.  Legibility improving.    Response to tx:  Patient will be finishing up with therapy  in the next 1-2 weeks, working on refining home program to continue to work towards reeducation of finger movements for isolated movements, tripod grasp, prehension patterns and pen/tool use.  He demonstrates understanding of exercises but will maybe want to video therapist demonstrating for home use.  Continue to work towards goals to improve speed, dexterity and coordination skills for refined movement patterns to continue to impact daily tasks to remain as independent as possible.                  OT Education - 12/08/18 1606    Education provided  Yes    Education Details  home exercises with handwriting and coordination skills    Person(s) Educated  Patient    Methods  Explanation;Demonstration;Verbal cues    Comprehension  Verbalized understanding;Returned demonstration          OT Long Term Goals - 11/16/18 4696      OT LONG TERM GOAL #6   Title  Pt. will improve left hand coordination skills to be able to button clothing.    Baseline  Pt. continues to present with limited LUE strength, and [place].    Time  12    Period  Weeks    Status  On-going    Target Date  12/15/18      OT LONG TERM GOAL  #9   Baseline  Pt. will write one sentence with 75% legibility and efficiently while maintainingg grasp on pen 100% of the time.    Time  12    Period  Weeks    Status  On-going    Target Date  12/15/18      OT LONG TERM GOAL  #10   TITLE  Pt. will independently use his left hand to grasp and efficiently accomodate to the varying weights of ADL objects during self care tasks.     Baseline  Pt. continues to work on grasping and adjusting his hand to the varying weights of objects.    Time  12    Period  Weeks    Status  On-going    Target Date  12/15/18      OT LONG TERM GOAL  #11   TITLE  Pt. will improve [facility] skills to be able to independently manipulate items for ADLs/IADLs when UEs are elevated when reaching into the closet, or microwave.    Baseline  Pt. continues  to have difficulty manipulating objects when UEs elevated     Time  12    Period  Weeks    Status  On-going    Target Date  12/15/18            Plan - 12/08/18 1608    Clinical Impression Statement  Patient will be finishing up with therapy in the next 1-2 weeks, working on refining home program to continue to work towards reeducation of finger movements  for isolated movements, tripod grasp, prehension patterns and pen/tool use.  He demonstrates understanding of exercises but will maybe want to video therapist demonstrating for home use.  Continue to work towards goals to improve speed, dexterity and coordination skills for refined movement patterns to continue to impact daily tasks to remain as independent as possible.    OT Occupational Profile and History  Comprehensive Assessment- Review of records and extensive additional review of physical, cognitive, psychosocial history related to current functional performance    Occupational performance deficits (Please refer to evaluation for details):  ADL's;IADL's    Body Structure / Function / Physical Skills  ADL;IADL;FMC;Strength;Muscle spasms;Coordination    Clinical Decision Making  Several treatment options, min-mod task modification necessary    OT Frequency  2x / week    OT Duration  12 weeks    OT Treatment/Interventions  Self-care/ADL training;Therapeutic exercise;Neuromuscular education;Patient/family education;Energy conservation;Therapeutic activities;DME and/or AE instruction    Consulted and Agree with Plan of Care  Patient       Patient will benefit from skilled therapeutic intervention in order to improve the following deficits and impairments:   Body Structure / Function / Physical Skills: ADL, IADL, FMC, Strength, Muscle spasms, Coordination       Visit Diagnosis: 1. Muscle weakness (generalized)   2. Other lack of coordination       Problem List Patient Active Problem List   Diagnosis Date Noted  . Pure  hypercholesterolemia 05/28/2017  . Erectile dysfunction 05/28/2017  . Left hemiparesis (HCC)   . Essential hypertension 11/29/2016  . 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 Chronic hepatitis C without hepatic coma (HCC) 07/22/2013    T , OTR/L, CLT  , 12/08/2018, 4:28 PM  [facility] [address] [place], [place], [address] Phone: [phone]   Fax:  [phone]  Name: Jason CroakJames Enloe MRN: [id number] Date of Birth: 1945/09/17

## 2018-12-09 ENCOUNTER — Other Ambulatory Visit: Payer: Self-pay

## 2018-12-09 ENCOUNTER — Encounter: Payer: Self-pay | Admitting: Occupational Therapy

## 2018-12-09 ENCOUNTER — Ambulatory Visit: Payer: Medicare Other | Admitting: Occupational Therapy

## 2018-12-09 DIAGNOSIS — R278 Other lack of coordination: Secondary | ICD-10-CM | POA: Diagnosis not present

## 2018-12-09 DIAGNOSIS — M6281 Muscle weakness (generalized): Secondary | ICD-10-CM

## 2018-12-09 DIAGNOSIS — H2512 Age-related nuclear cataract, left eye: Secondary | ICD-10-CM | POA: Diagnosis not present

## 2018-12-09 NOTE — Therapy (Signed)
[facility]IN [facility] [address] [place], [place], [address] Phone: [phone]   Fax:  [phone]  Occupational Therapy Treatment  Patient Details  Name: Jason CroakJames Nolan MRN: [id number] Date of Birth: 07-02-1945 No data recorded  Encounter Date: 12/09/2018  OT End of Session - 12/09/18 0937    Visit Number  137    Number of Visits  144    Date for OT Re-Evaluation  12/15/18    Authorization Type  Progress reporting period starting 10/14/2018    OT Start Time  0929    OT Stop Time  1014    OT Time Calculation (min)  45 min    Activity Tolerance  Patient tolerated treatment well    Behavior During Therapy  [facility] for tasks assessed/performed       Past Medical History:  Diagnosis Date  . Glaucoma   . Hepatitis C   . Stroke [facility](HCC)     Past Surgical History:  Procedure Laterality Date  . COLONOSCOPY WITH PROPOFOL N/A 07/01/2018   Procedure: COLONOSCOPY WITH PROPOFOL;  Surgeon: Wyline MoodAnna, Kiran, MD;  [facility];  Service: Gastroenterology;  Laterality: N/A;  . IR ANGIO INTRA EXTRACRAN SEL COM CAROTID INNOMINATE UNI L MOD SED  11/25/2016  . IR ANGIO VERTEBRAL SEL SUBCLAVIAN INNOMINATE UNI R MOD SED  11/25/2016  . IR ANGIO VERTEBRAL SEL VERTEBRAL UNI L MOD SED  11/25/2016  . IR INTRAVSC STENT CERV CAROTID W/O EMB-PROT MOD SED INC ANGIO  11/25/2016  . IR PERCUTANEOUS ART THROMBECTOMY/INFUSION INTRACRANIAL INC DIAG ANGIO  11/25/2016  . IR RADIOLOGIST EVAL & MGMT  02/13/2017  . RADIOLOGY WITH ANESTHESIA N/A 11/25/2016   Procedure: RADIOLOGY WITH ANESTHESIA;  Surgeon: Radiologist, Medication, MD;  Location: [facility] OR;  Service: Radiology;  Laterality: N/A;    There were no vitals filed for this visit.  Subjective Assessment - 12/09/18 0935    Subjective   Pt reports he tried the pen exercise at home but still needs to use his other hand to help stabilize it, cannot do one handed.    Pertinent History  Pt. is a 73 y.o. 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 [facility] He underwent CTA brain showed short segment near occlusion of proximal R-ICA with associated intraluminal thrombus --likely due to dissection and emergent large vessel occlusion with right M2 occlusion and evolving right frontal lobe infarct.  He underwent cerebral angio with  complete revascularization of occluded superior division of R-MCA and near complete revascularization of proximal R-ICA with stent assisted angioplasty.  Stroke felt to be embolic in setting of ruptured plaque R-ICA and he was placed on ASA and brillinta due to stent. Pt. was transferred to inpatient rehab for several weeks, had home health therapy upon discharge, and now is ready for outpatient therapy services.    Limitations  Dominant LUE functioning, left hand edema, distal ROM, coordination, and overal strength.    Patient Stated Goals  To regain the use of his LUE.    Currently in Pain?  No/denies    Pain Score  0-No pain      Neuromuscular reeducation:   Patient seen this date for focus on fine motor coordination tasks as follows:  Use of red foam built up handle for knife grip to cut consistency of meat, cues for finger and hand placement during task.  Patient also seen for pen exercises as instructed last session and outlined below, required cues and  therapist demo.  Difficulty with producing movement patterns but is able to demonstrate understanding of exercises.  Issued written handout with exercises below.  Patient unable to perform connecting small snap beads this date.      Home Exercises for developing pen grip and hand function 1.  Practice holding pen as if you are going to write, try to push fingers forwards and back (pen does not move, just the fingers) 2. Hold the pen like you are going to write, move your fingers up and down the pen. 3. Click the top of the pen with your thumb 4. Click the top of the pen with your index  finger 5. Alternate between #4 and #5 as quick as you can.  6. Hold the pen horizontally between the thumb and index and roll the pen back and forth, palm up with thumb on top of the pen. 7. Hold the pen horizontally between the thumb and index and roll the pen back and forth, palm down with index on top of the pen. 8. Use a pen with a separate top and hold in left hand, try to remove the top with your thumb and index finger Turn pen around and try to remove the top with your small finger and ring finger.         Response to tx:  Patient has continued to progress in all areas And has shown progress functional he with his daily activities. He still demonstrates impairments in higher level coordination tasks on the left hand and has difficulty with hand writing. He agrees he is ready for discharge after next session to finalize his home exercise program. and has shown progress functionally with his daily activities. He still demonstrates impairments in higher level coordination tasks on the left hand and has difficulty with hand writing. He agrees he is ready for discharge after next session to finalize his home exercise program. Patient demonstrates understanding of exercises to this point and is consistent with performing his program at home. Will plan to take final measurements and assessment next session and finalize home program.         OT Education - 12/09/18 [phone]    Education Details  home exercises with use of pen for coordination    Person(s) Educated  Patient    Methods  Explanation;Demonstration;Verbal cues    Comprehension  Verbalized understanding;Returned demonstration          OT Long Term Goals - 11/16/18 6160      OT LONG TERM GOAL #6   Title  Pt. will improve left hand coordination skills to be able to button clothing.    Baseline  Pt. continues to present with limited LUE strength, and [place].    Time  12    Period  Weeks    Status  On-going    Target Date   12/15/18      OT LONG TERM GOAL  #9   Baseline  Pt. will write one sentence with 75% legibility and efficiently while maintainingg grasp on pen 100% of the time.    Time  12    Period  Weeks    Status  On-going    Target Date  12/15/18      OT LONG TERM GOAL  #10   TITLE  Pt. will independently use his left hand to grasp and efficiently accomodate to the varying weights of ADL objects during self care tasks.     Baseline  Pt. continues to work on grasping  and adjusting his hand to the varying weights of objects.    Time  12    Period  Weeks    Status  On-going    Target Date  12/15/18      OT LONG TERM GOAL  #11   TITLE  Pt. will improve [facility] skills to be able to independently manipulate items for ADLs/IADLs when UEs are elevated when reaching into the closet, or microwave.    Baseline  Pt. continues to have difficulty manipulating objects when UEs elevated     Time  12    Period  Weeks    Status  On-going    Target Date  12/15/18            Plan - 12/09/18 1905    Clinical Impression Statement  Patient has continued to progress in all areas And has shown progress functional he with his daily activities. He still demonstrates impairments in higher level coordination tasks on the left hand and has difficulty with hand writing. He agrees he is ready for discharge after next session to finalize his home exercise program. and has shown progress functionally with his daily activities. He still demonstrates impairments in higher level coordination tasks on the left hand and has difficulty with hand writing. He agrees he is ready for discharge after next session to finalize his home exercise program. Patient demonstrates understanding of exercises to this point and is consistent with performing his program at home. Will plan to take final measurements and assessment next session and finalize home program.    OT Occupational Profile and History  Comprehensive Assessment- Review of records  and extensive additional review of physical, cognitive, psychosocial history related to current functional performance    Occupational performance deficits (Please refer to evaluation for details):  ADL's;IADL's    Body Structure / Function / Physical Skills  ADL;IADL;FMC;Strength;Muscle spasms;Coordination    Rehab Potential  Excellent    Clinical Decision Making  Several treatment options, min-mod task modification necessary    OT Frequency  2x / week    OT Duration  12 weeks    OT Treatment/Interventions  Self-care/ADL training;Therapeutic exercise;Neuromuscular education;Patient/family education;Energy conservation;Therapeutic activities;DME and/or AE instruction    Consulted and Agree with Plan of Care  Patient       Patient will benefit from skilled therapeutic intervention in order to improve the following deficits and impairments:   Body Structure / Function / Physical Skills: ADL, IADL, FMC, Strength, Muscle spasms, Coordination       Visit Diagnosis: 1. Muscle weakness (generalized)   2. Other lack of coordination       Problem List Patient Active Problem List   Diagnosis Date Noted  . Pure hypercholesterolemia 05/28/2017  . Erectile dysfunction 05/28/2017  . Left hemiparesis (HCC)   . Essential hypertension 11/29/2016  . 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 Chronic hepatitis C without hepatic coma (HCC) 07/22/2013   Kaushal Vannice T Jadien Lehigh, OTR/L, CLT  Kaidan Spengler 12/09/2018, 7:07 PM  [facility]IN [facility] [address] [place], [place], [address] Phone: [phone]   Fax:  [phone]  Name: Jason Nolan

## 2018-12-15 ENCOUNTER — Other Ambulatory Visit: Payer: Self-pay

## 2018-12-15 ENCOUNTER — Ambulatory Visit: Payer: Medicare Other | Admitting: Occupational Therapy

## 2018-12-15 DIAGNOSIS — R278 Other lack of coordination: Secondary | ICD-10-CM

## 2018-12-15 DIAGNOSIS — M6281 Muscle weakness (generalized): Secondary | ICD-10-CM

## 2018-12-15 NOTE — Therapy (Signed)
[facility] MAIN [facility] SERVICES [address] [place], [place], [address] Phone: [phone]   Fax:  [phone]  Occupational Therapy Treatment/Discharge Note  Patient Details  Name: Jason Nolan MRN: [id number] Date of Birth: January 19, 1946 No data recorded  Encounter Date: 12/15/2018    Past Medical History:  Diagnosis Date  . Glaucoma   . Hepatitis C   . Stroke [facility])     Past Surgical History:  Procedure Laterality Date  . COLONOSCOPY WITH PROPOFOL N/A 07/01/2018   Procedure: COLONOSCOPY WITH PROPOFOL;  Surgeon: Jonathon Bellows, MD;  [facility] ENDOSCOPY;  Service: Gastroenterology;  Laterality: N/A;  . IR ANGIO INTRA EXTRACRAN SEL COM CAROTID INNOMINATE UNI L MOD SED  11/25/2016  . IR ANGIO VERTEBRAL SEL SUBCLAVIAN INNOMINATE UNI R MOD SED  11/25/2016  . IR ANGIO VERTEBRAL SEL VERTEBRAL UNI L MOD SED  11/25/2016  . IR INTRAVSC STENT CERV CAROTID W/O EMB-PROT MOD SED INC ANGIO  11/25/2016  . IR PERCUTANEOUS ART THROMBECTOMY/INFUSION INTRACRANIAL INC DIAG ANGIO  11/25/2016  . IR RADIOLOGIST EVAL & MGMT  02/13/2017  . RADIOLOGY WITH ANESTHESIA N/A 11/25/2016   Procedure: RADIOLOGY WITH ANESTHESIA;  Surgeon: Radiologist, Medication, MD;  Location: [place];  Service: Radiology;  Laterality: N/A;    There were no vitals filed for this visit.  Subjective Assessment - 12/15/18 0838    Subjective   Pt. reports feeling like his hand has progressed    Pertinent History  Pt. is a 73 y.o. 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 Pt. was transferred to [facility]. He underwent CTA brain showed short segment near occlusion of proximal R-ICA with associated intraluminal thrombus --likely due to dissection and emergent large vessel occlusion with right M2 occlusion and evolving right frontal lobe infarct.  He underwent cerebral angio with  complete revascularization of occluded superior division of R-MCA and near complete revascularization of proximal  R-ICA with stent assisted angioplasty.  Stroke felt to be embolic in setting of ruptured plaque R-ICA and he was placed on ASA and brillinta due to stent. Pt. was transferred to inpatient rehab for several weeks, had home health therapy upon discharge, and now is ready for outpatient therapy services.    Currently in Pain?  No/denies         [facility] OT Assessment - 12/15/18 0840      Coordination   Left 9 Hole Peg Test  48 sec.      Hand Function   Left Hand Grip (lbs)  65    Left Hand Lateral Pinch  17 lbs    Left 3 point pinch  13 lbs      OT TREATMENT    Measurements were obtained, and goals were reviewed with the patient.  Neuro muscular re-ed:  Pt. education was provided about a HEP for left hand function, and Blue skills at home.  Self-care:  Goals were reviewed with the pt. Pt. worked on Mudlogger. Pt. presented with 75% legibility in cursive form with no deviation from the line.                           OT Long Term Goals - 12/15/18 0846      OT LONG TERM GOAL #6   Title  Pt. will improve left hand coordination skills to be able to button clothing.    Baseline  Pt. has improved with left [facility] skills.    Time  12    Period  Weeks    Status  Achieved      OT LONG TERM GOAL  #9   Baseline  Pt. will write one sentence with 75% legibility and efficiently while maintainingg grasp on pen 100% of the time.    Time  12    Period  Weeks    Status  Achieved      OT LONG TERM GOAL  #10   TITLE  Pt. will independently use his left hand to grasp and efficiently accomodate to the varying weights of ADL objects during self care tasks.     Baseline  Pt. continues to work on grasping and adjusting his hand to the varying weights of objects.    Time  12    Period  Weeks    Status  Achieved      OT LONG TERM GOAL  #11   TITLE  Pt. will improve [facility] skills to be able to independently manipulate items for ADLs/IADLs when UEs are elevated when  reaching into the closet, or microwave.    Baseline  Pt. is now able to manipulate objects when UEs are elevated    Time  12    Period  Weeks    Status  Achieved            Plan - 12/15/18 0906    Clinical Impression Statement Pt. has made excellent progress towards his goals, and has achieved the established goals. Pt. has improved overall with left hand LUE edema, strength, motor control skills, FMC, and ADL/IADL functioning. Pt. is now engaging his LUE during daily ADL tasks. Pt. has been provided with a HEP for continued [facility] skills. Pt. is now appropriate for discharge from OT skilled services.    OT Occupational Profile and History  Comprehensive Assessment- Review of records and extensive additional review of physical, cognitive, psychosocial history related to current functional performance    Occupational performance deficits (Please refer to evaluation for details):  ADL's;IADL's    Body Structure / Function / Physical Skills  ADL;IADL;FMC;Strength;Muscle spasms;Coordination    Rehab Potential  Excellent    Clinical Decision Making  Several treatment options, min-mod task modification necessary    Modification or Assistance to Complete Evaluation   No modification of tasks or assist necessary to complete eval    OT Frequency  2x / week    OT Duration  12 weeks    OT Treatment/Interventions  Self-care/ADL training;Therapeutic exercise;Neuromuscular education;Patient/family education;Energy conservation;Therapeutic activities;DME and/or AE instruction    Consulted and Agree with Plan of Care  Patient       Patient will benefit from skilled therapeutic intervention in order to improve the following deficits and impairments:   Body Structure / Function / Physical Skills: ADL, IADL, FMC, Strength, Muscle spasms, Coordination       Visit Diagnosis: 1. Muscle weakness (generalized)   2. Other lack of coordination       Problem List Patient Active Problem List   Diagnosis  Date Noted  . Pure hypercholesterolemia 05/28/2017  . Erectile dysfunction 05/28/2017  . Left hemiparesis (HCC)   . Essential hypertension 11/29/2016  . 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 Chronic hepatitis C without hepatic coma (HCC) 07/22/2013    Olegario MessierElaine Shynia Daleo, MS, OTR/L 12/15/2018, 9:13 AM  [facility] [address]  [place], [place], [address] Phone: [phone]   Fax:  [phone]  Name: Jason CroakJames Pidgeon MRN: [id number] Date of Birth: 10-May-1945

## 2018-12-17 ENCOUNTER — Ambulatory Visit: Payer: Medicare Other | Admitting: Occupational Therapy

## 2018-12-21 ENCOUNTER — Ambulatory Visit: Payer: Medicare Other | Admitting: Occupational Therapy

## 2018-12-23 ENCOUNTER — Encounter: Payer: Medicare Other | Admitting: Occupational Therapy

## 2018-12-28 ENCOUNTER — Encounter: Payer: Medicare Other | Admitting: Occupational Therapy

## 2018-12-30 ENCOUNTER — Encounter: Payer: Medicare Other | Admitting: Occupational Therapy

## 2019-01-04 ENCOUNTER — Encounter: Payer: Medicare Other | Admitting: Occupational Therapy

## 2019-01-06 ENCOUNTER — Encounter: Payer: Medicare Other | Admitting: Occupational Therapy

## 2019-01-13 ENCOUNTER — Encounter: Payer: Medicare Other | Admitting: Occupational Therapy

## 2019-01-18 ENCOUNTER — Encounter: Payer: Medicare Other | Admitting: Occupational Therapy

## 2019-01-18 DIAGNOSIS — H2511 Age-related nuclear cataract, right eye: Secondary | ICD-10-CM | POA: Diagnosis not present

## 2019-01-18 DIAGNOSIS — H401112 Primary open-angle glaucoma, right eye, moderate stage: Secondary | ICD-10-CM | POA: Diagnosis not present

## 2019-01-18 DIAGNOSIS — H25811 Combined forms of age-related cataract, right eye: Secondary | ICD-10-CM | POA: Diagnosis not present

## 2019-01-20 ENCOUNTER — Encounter: Payer: Medicare Other | Admitting: Occupational Therapy

## 2019-01-21 ENCOUNTER — Other Ambulatory Visit (HOSPITAL_COMMUNITY): Payer: Self-pay | Admitting: Interventional Radiology

## 2019-01-21 DIAGNOSIS — I771 Stricture of artery: Secondary | ICD-10-CM

## 2019-01-25 ENCOUNTER — Encounter: Payer: Medicare Other | Admitting: Occupational Therapy

## 2019-01-27 ENCOUNTER — Encounter: Payer: Medicare Other | Admitting: Occupational Therapy

## 2019-02-01 ENCOUNTER — Encounter: Payer: Medicare Other | Admitting: Occupational Therapy

## 2019-02-01 DIAGNOSIS — H401121 Primary open-angle glaucoma, left eye, mild stage: Secondary | ICD-10-CM | POA: Diagnosis not present

## 2019-02-01 DIAGNOSIS — H25812 Combined forms of age-related cataract, left eye: Secondary | ICD-10-CM | POA: Diagnosis not present

## 2019-02-01 DIAGNOSIS — H2512 Age-related nuclear cataract, left eye: Secondary | ICD-10-CM | POA: Diagnosis not present

## 2019-02-03 ENCOUNTER — Encounter: Payer: Medicare Other | Admitting: Occupational Therapy

## 2019-02-09 ENCOUNTER — Other Ambulatory Visit: Payer: Self-pay

## 2019-02-09 ENCOUNTER — Ambulatory Visit (HOSPITAL_COMMUNITY)
Admission: RE | Admit: 2019-02-09 | Discharge: 2019-02-09 | Disposition: A | Discharge status: Home / Self Care | Payer: Medicare Other | Source: Ambulatory Visit | Attending: Interventional Radiology | Admitting: Interventional Radiology

## 2019-02-09 DIAGNOSIS — I771 Stricture of artery: Secondary | ICD-10-CM | POA: Insufficient documentation

## 2019-02-09 NOTE — Progress Notes (Signed)
Carotid duplex       has been completed. Preliminary results can be found under CV proc through chart review. Tadarius Maland, BS, RDMS, RVT   

## 2019-02-10 ENCOUNTER — Telehealth (HOSPITAL_COMMUNITY): Payer: Self-pay

## 2019-02-10 NOTE — Telephone Encounter (Signed)
Pt agreed to f/u in 6 months with us carotid. AW 

## 2019-02-28 IMAGING — US US ABDOMEN LIMITED W/ ELASTOGRAPHY
1 series · 11 of 11 positions shown · non-contrast
Comparison: 07/01/2017

CLINICAL DATA: Chronic hepatitis-C.

EXAM:
US ABDOMEN LIMITED - RIGHT UPPER QUADRANT
ULTRASOUND HEPATIC ELASTOGRAPHY
TECHNIQUE: Limited right upper quadrant abdominal ultrasound was performed. In
addition, ultrasound elastography evaluation of the liver was
performed. A region of interest was placed in the right lobe of the
liver. Following application of a compressive sonographic pulse,
shear waves were detected in the adjacent hepatic tissue and the
shear wave velocity was calculated. Multiple assessments were
performed at the selected site. Median shear wave velocity is
correlated to a Metavir fibrosis score.

[Series 1: us abdomen limited w/ elastography · 0.19mm/px · 11 of 11 slices shown]
[im 1/11]
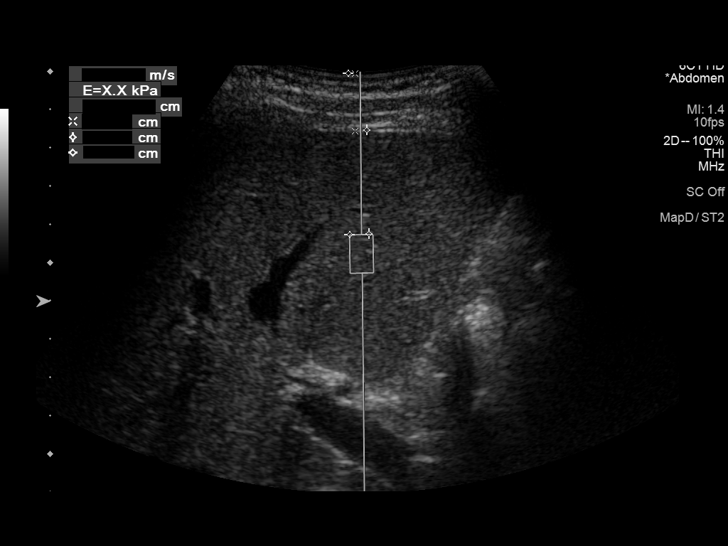
[im 2/11]
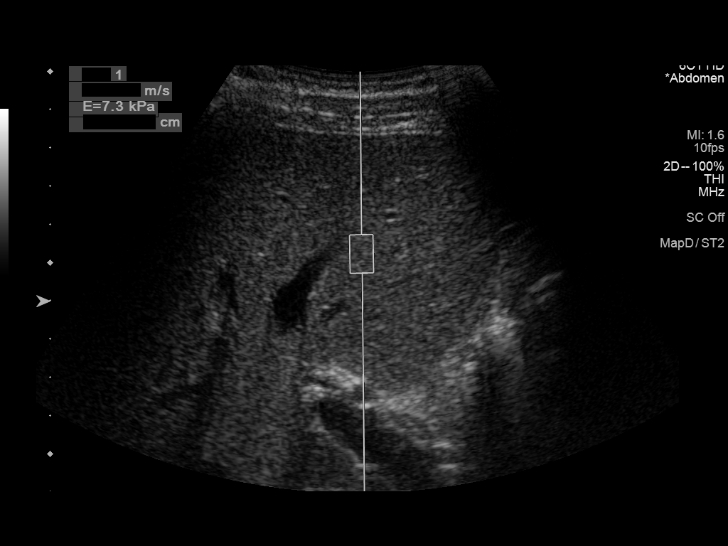
[im 3/11]
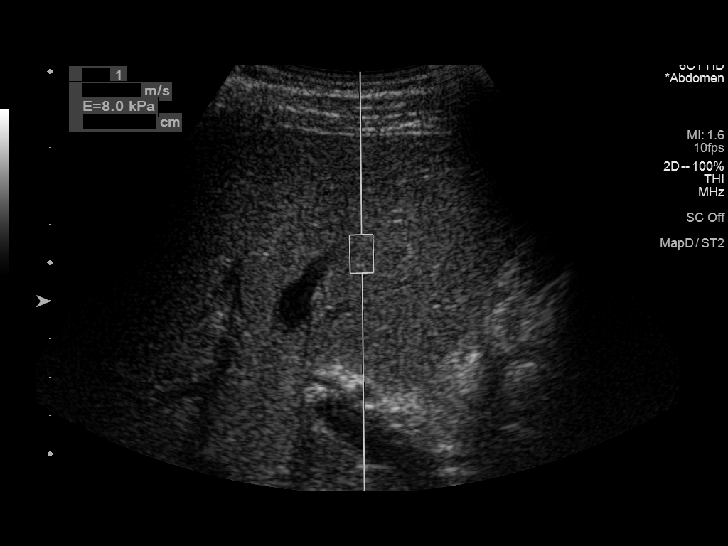
[im 4/11]
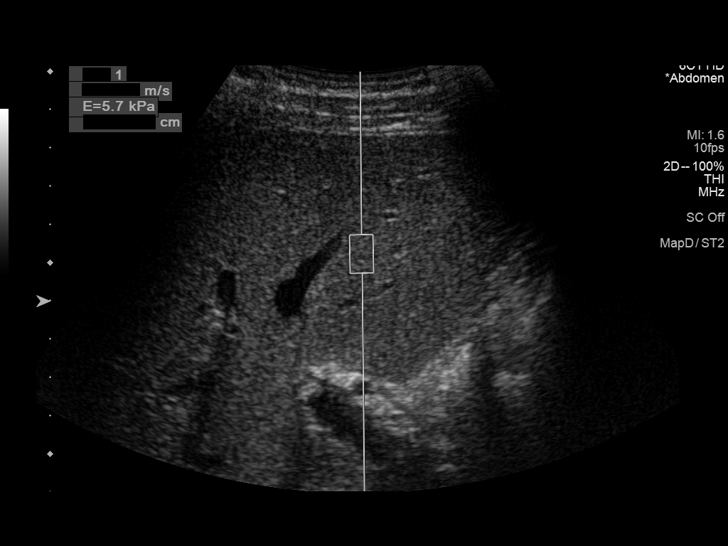
[im 5/11]
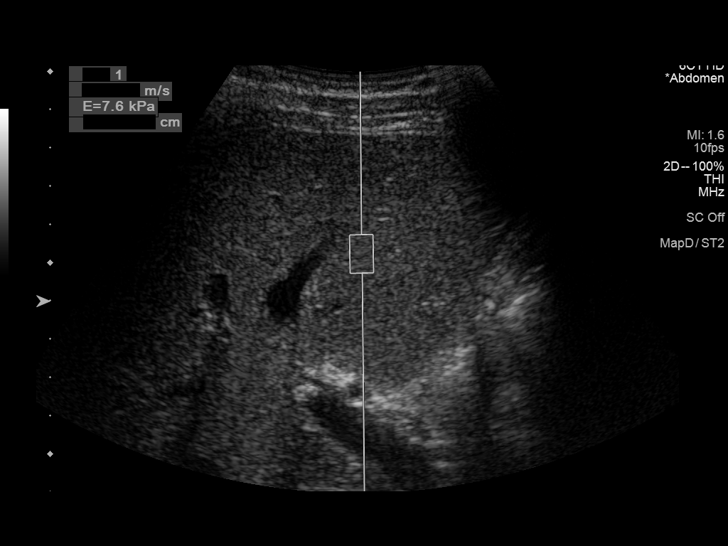
[im 6/11]
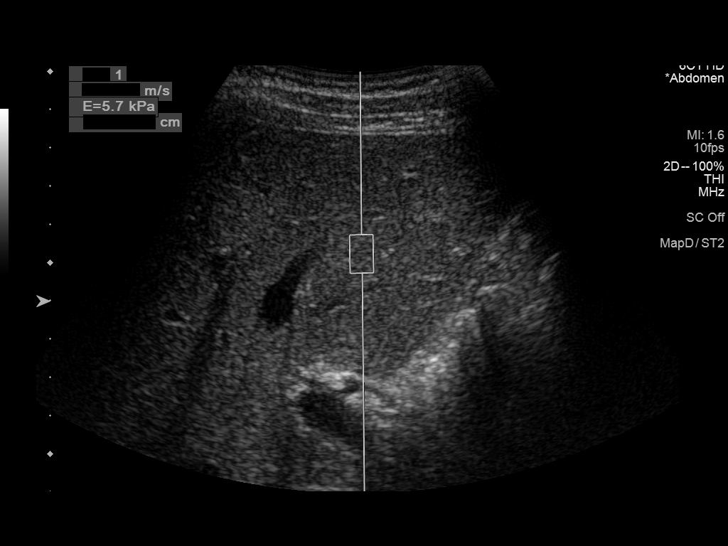
[im 7/11]
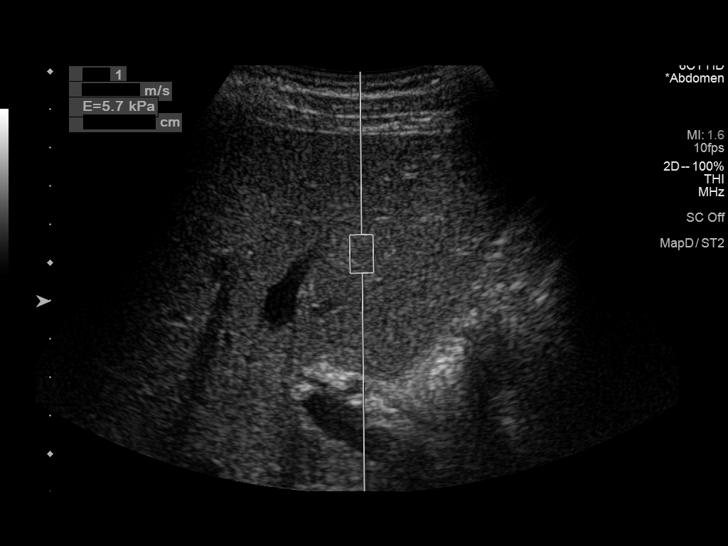
[im 8/11]
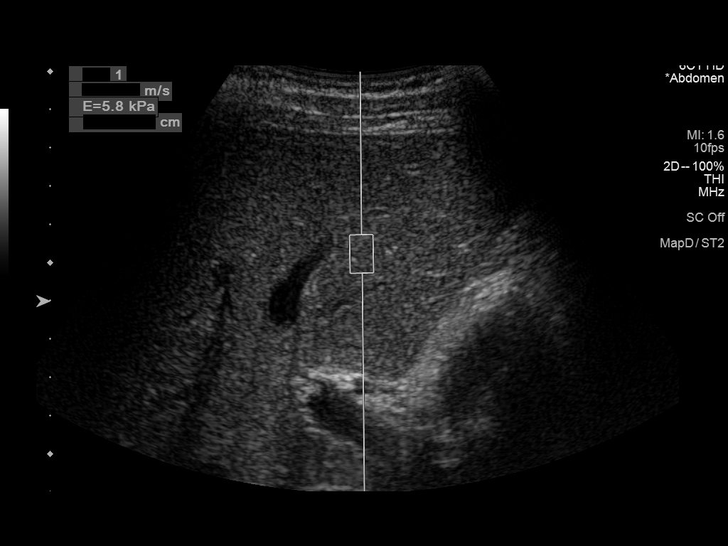
[im 9/11]
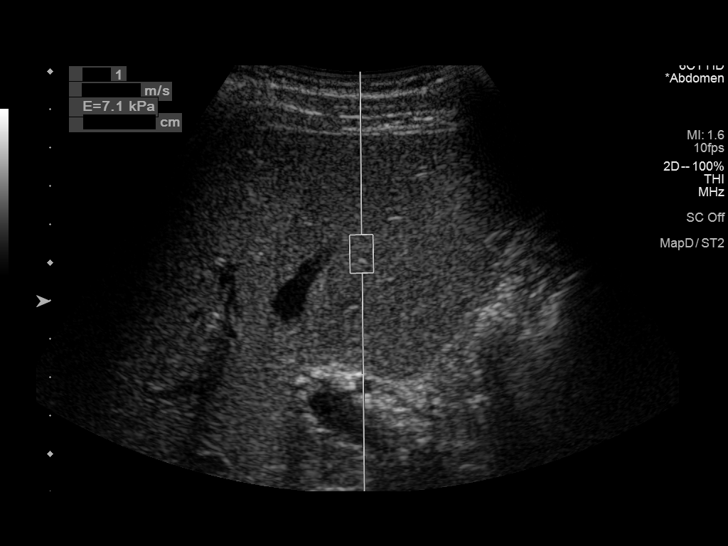
[im 10/11]
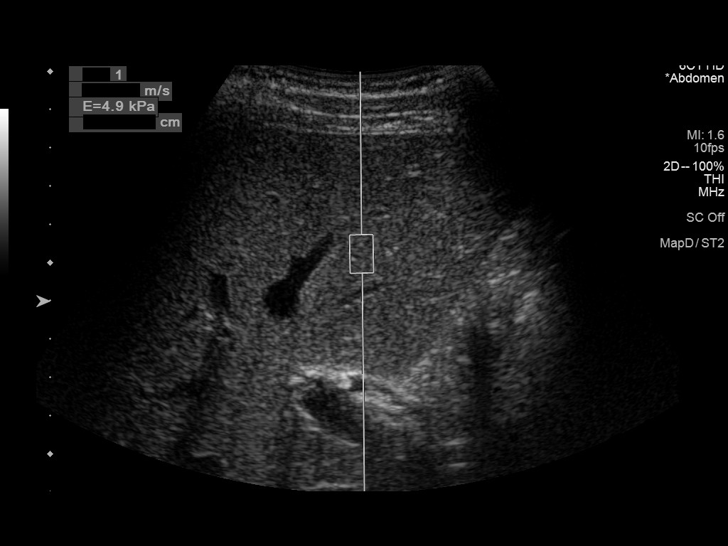
[im 11/11]
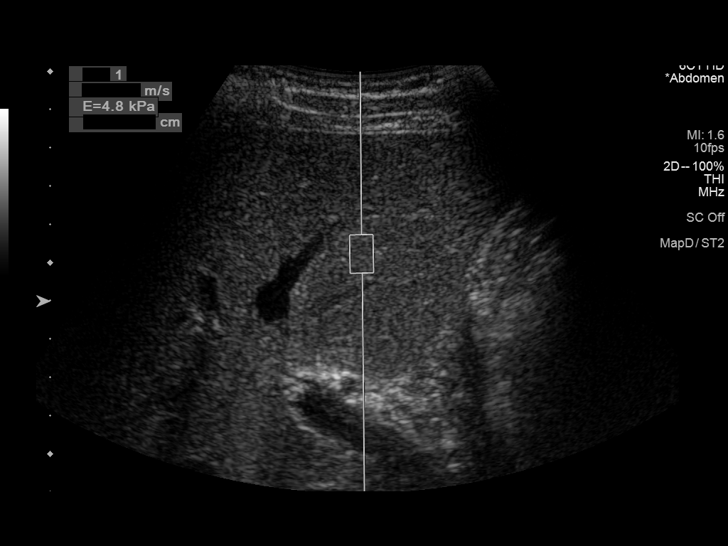

[11 of 11 positions shown; findings below may reference images not displayed]

FINDINGS: ULTRASOUND ABDOMEN LIMITED RIGHT UPPER QUADRANT

Gallbladder:

No gallstones or wall thickening visualized. No sonographic Murphy
sign noted.

Common bile duct:

Diameter: 4 mm, within normal limits.

Liver:

No focal lesion identified. Within normal limits in parenchymal
echogenicity. Portal vein is patent on color Doppler imaging with
normal direction of blood flow towards the liver.

ULTRASOUND HEPATIC ELASTOGRAPHY

Device: Siemens Helix VTQ

Patient position: Supine

Transducer [id number]

Number of measurements: 10

Hepatic segment:  8

Median velocity:   1.38 m/sec

IQR:

IQR/Median velocity ratio:

Corresponding Metavir fibrosis score:  F2 + some F3

Risk of fibrosis: Moderate

Limitations of exam: None

Please note that abnormal shear wave velocities may also be
identified in clinical settings other than with hepatic fibrosis,
such as: acute hepatitis, elevated right heart and central venous
pressures including use of beta blockers, Cody disease
(Lasafe), infiltrative processes such as
mastocytosis/amyloidosis/infiltrative tumor, extrahepatic
cholestasis, in the post-prandial state, and liver transplantation.
Correlation with patient history, laboratory data, and clinical
condition recommended.
IMPRESSION: ULTRASOUND ABDOMEN:

Negative.  No hepatobiliary abnormality identified.

ULTRASOUND HEPATIC ELASTOGRAHY:

Median hepatic shear wave velocity is calculated at 1.38 m/sec.

Corresponding Metavir fibrosis score is F2 + some F3.

Risk of fibrosis is Moderate.

Follow-up: Additional testing appropriate

## 2019-05-30 ENCOUNTER — Encounter: Payer: Self-pay | Admitting: Internal Medicine

## 2019-06-01 ENCOUNTER — Other Ambulatory Visit: Payer: Self-pay

## 2019-06-01 ENCOUNTER — Encounter: Payer: Self-pay | Admitting: Internal Medicine

## 2019-06-01 ENCOUNTER — Ambulatory Visit (INDEPENDENT_AMBULATORY_CARE_PROVIDER_SITE_OTHER): Payer: Medicare Other | Admitting: Internal Medicine

## 2019-06-01 VITALS — BP 132/76 | HR 76 | Temp 97.3°F | Wt 203.0 lb

## 2019-06-01 DIAGNOSIS — G44209 Tension-type headache, unspecified, not intractable: Secondary | ICD-10-CM

## 2019-06-01 MED ORDER — TIZANIDINE HCL 2 MG PO CAPS: 2.0000 mg | ORAL_CAPSULE | Freq: Every evening | ORAL | 0 refills | Status: DC | PRN

## 2019-06-01 MED ORDER — KETOROLAC TROMETHAMINE 30 MG/ML IJ SOLN
30.0000 mg | Freq: Once | INTRAMUSCULAR | Status: AC
  Administered 2019-06-01: 30 mg via INTRAMUSCULAR

## 2019-06-01 NOTE — Patient Instructions (Signed)
Tension Headache, Adult A tension headache is pain, pressure, or aching in your head. Tension headaches can last from 30 minutes to several days. Follow these instructions at home: Managing pain  Take over-the-counter and prescription medicines only as told by your doctor.  When you have a headache, lie down in a dark, quiet room.  If told, put ice on your head and neck: ? Put ice in a plastic bag. ? Place a towel between your skin and the bag. ? Leave the ice on for 20 minutes, 2-3 times a day.  If told, put heat on the back of your neck. Do this as often as your doctor tells you to. Use the kind of heat that your doctor recommends, such as a moist heat pack or a heating pad. ? Place a towel between your skin and the heat. ? Leave the heat on for 20-30 minutes. ? Remove the heat if your skin turns bright red. Eating and drinking  Eat meals on a regular schedule.  Watch how much alcohol you drink: ? If you are a woman and are not pregnant, do not drink more than 1 drink a day. ? If you are a man, do not drink more than 2 drinks a day.  Drink enough fluid to keep your pee (urine) pale yellow.  Do not use a lot of caffeine, or stop using caffeine. Lifestyle  Get enough sleep. Get 7-9 hours of sleep each night. Or get the amount of sleep that your doctor tells you to.  At bedtime, remove all electronic devices from your room. Examples of electronic devices are computers, phones, and tablets.  Find ways to lessen your stress. Some things that can lessen stress are: ? Exercise. ? Deep breathing. ? Yoga. ? Music. ? Positive thoughts.  Sit up straight. Do not tighten (tense) your muscles.  Do not use any products that have nicotine or tobacco in them, such as cigarettes and e-cigarettes. If you need help quitting, ask your doctor. General instructions   Keep all follow-up visits as told by your doctor. This is important.  Avoid things that can bring on headaches. Keep a  journal to find out if certain things bring on headaches. For example, write down: ? What you eat and drink. ? How much sleep you get. ? Any change to your diet or medicines. Contact a doctor if:  Your headache does not get better.  Your headache comes back.  You have a headache and sounds, light, or smells bother you.  You feel sick to your stomach (nauseous) or you throw up (vomit).  Your stomach hurts. Get help right away if:  You suddenly get a very bad headache along with any of these: ? A stiff neck. ? Feeling sick to your stomach. ? Throwing up. ? Feeling weak. ? Trouble seeing. ? Feeling short of breath. ? A rash. ? Feeling unusually sleepy. ? Trouble speaking. ? Pain in your eye or ear. ? Trouble walking or balancing. ? Feeling like you will pass out (faint). ? Passing out. Summary  A tension headache is pain, pressure, or aching in your head.  Tension headaches can last from 30 minutes to several days.  Lifestyle changes and medicines may help relieve pain. This information is not intended to replace advice given to you by your health care provider. Make sure you discuss any questions you have with your health care provider. Document Revised: 02/17/2019 Document Reviewed: 08/02/2016 Elsevier Patient Education  2020 [employer].  

## 2019-06-01 NOTE — Progress Notes (Signed)
Subjective:    Patient ID: Jason Nolan, male    DOB: 08/25/1945, 74 y.o.   MRN: [id number]  HPI  Pt presents to the clinic today with c/o headaches. This started 2 weeks ago. He reports the headache is located in the back of his head. He describes the pain as aching. His shoulders are tight today in the trapezius arera. He denies dizziness, visual changes, sensitivity to light or sound, or neck pain. He has tried Tylenol and CBD gummies with minimal relief.   Review of Systems  Past Medical History:  Diagnosis Date  . Glaucoma   . Hepatitis C   . Stroke [facility])     Current Outpatient Medications  Medication Sig Dispense Refill  . amLODipine (NORVASC) 10 MG tablet Take 1 tablet (10 mg total) by mouth daily. 90 tablet 3  . aspirin 81 MG chewable tablet Chew 1 tablet (81 mg total) by mouth daily.    Marland Kitchen atorvastatin (LIPITOR) 40 MG tablet TAKE 1 TABLET (40 MG TOTAL) BY MOUTH DAILY AT 6 PM. 90 tablet 3  . dorzolamide-timolol (COSOPT) 22.3-6.8 MG/ML ophthalmic solution Place 1 drop into both eyes.     . tadalafil (CIALIS) 20 MG tablet TAKE 0.5-1 TABLETS (10-20 MG TOTAL) BY MOUTH EVERY OTHER DAY AS NEEDED FOR ERECTILE DYSFUNCTION.  2  . VYZULTA 0.024 % SOLN Apply 1 drop to eye at bedtime.      No current facility-administered medications for this visit.    No Known Allergies  Family History  Problem Relation Age of Onset  . Hypertension Mother   . Hypertension Father     Social History   Socioeconomic History  . Marital status: Married    Spouse name: Not on file  . Number of children: Not on file  . Years of education: Not on file  . Highest education level: Not on file  Occupational History  . Not on file  Tobacco Use  . Smoking status: Former Smoker    Packs/day: 0.50    Years: 15.00    Pack years: 7.50    Quit date: 1990    Years since quitting: 31.0  . Smokeless tobacco: Never Used  Substance and Sexual Activity  . Alcohol use: No    Alcohol/week: 0.0 standard  drinks  . Drug use: No  . Sexual activity: Yes    Partners: Female    Birth control/protection: None  Other Topics Concern  . Not on file  Social History Narrative  . Not on file   Social Determinants of Health   Financial Resource Strain:   . Difficulty of Paying Living Expenses: Not on file  Food Insecurity:   . Worried About Charity fundraiser in the Last Year: Not on file  . Ran Out of Food in the Last Year: Not on file  Transportation Needs:   . Lack of Transportation (Medical): Not on file  . Lack of Transportation (Non-Medical): Not on file  Physical Activity:   . Days of Exercise per Week: Not on file  . Minutes of Exercise per Session: Not on file  Stress:   . Feeling of Stress : Not on file  Social Connections:   . Frequency of Communication with Friends and Family: Not on file  . Frequency of Social Gatherings with Friends and Family: Not on file  . Attends Religious Services: Not on file  . Active Member of Clubs or Organizations: Not on file  . Attends Archivist Meetings: Not on  file  . Marital Status: Not on file  Intimate Partner Violence:   . Fear of Current or Ex-Partner: Not on file  . Emotionally Abused: Not on file  . Physically Abused: Not on file  . Sexually Abused: Not on file     Constitutional: Pt reports headache. Denies fever, malaise, fatigue, or abrupt weight changes.  HEENT: Denies eye pain, eye redness, ear pain, ringing in the ears, wax buildup, runny nose, nasal congestion, bloody nose, or sore throat. Respiratory: Denies difficulty breathing, shortness of breath, cough or sputum production.   Cardiovascular: Denies chest pain, chest tightness, palpitations or swelling in the hands or feet.  Musculoskeletal: Pt reports shoulder muscle stiffness. Denies decrease in range of motion, difficulty with gait, or joint pain and swelling.  Neurological: Denies dizziness, difficulty with memory, difficulty with speech or problems with  balance and coordination.  Psych: Denies anxiety or increased stress.   No other specific complaints in a complete review of systems (except as listed in HPI above).     Objective:   Physical Exam  BP 132/76   Pulse 76   Temp (!) 97.3 F (36.3 C) (Temporal)   Wt 203 lb (92.1 kg)   SpO2 98%   BMI 29.13 kg/m   Wt Readings from Last 3 Encounters:  10/13/18 200 lb (90.7 kg)  07/03/18 198 lb (89.8 kg)  07/01/18 192 lb (87.1 kg)    General: Appears his stated age, well developed, well nourished in NAD. HEENT: Head: normal shape and size; Eyes: sclera white, no icterus, conjunctiva pink, PERRLA and EOMs intact. Cardiovascular: Normal rate and rhythm. S1,S2 noted.  No murmur, rubs or gallops noted. No JVD or BLE edema.  Pulmonary/Chest: Normal effort and positive vesicular breath sounds. No respiratory distress. No wheezes, rales or ronchi noted.  Musculoskeletal: Shoulder tightness in trapezius region. Normal flexion, extension and rotation of the spine. N Neurological: Alert and oriented.  Coordination normal.     BMET    Component Value Date/Time   NA 139 07/03/2018 1435   K 4.4 07/03/2018 1435   CL 104 07/03/2018 1435   CO2 28 07/03/2018 1435   GLUCOSE 61 (L) 07/03/2018 1435   BUN 13 07/03/2018 1435   CREATININE 0.96 07/03/2018 1435   CALCIUM 9.1 07/03/2018 1435   GFRNONAA >60 12/19/2016 0406   GFRAA >60 12/19/2016 0406    Lipid Panel     Component Value Date/Time   CHOL 129 07/03/2018 1435   TRIG 59.0 07/03/2018 1435   HDL 59.10 07/03/2018 1435   CHOLHDL 2 07/03/2018 1435   VLDL 11.8 07/03/2018 1435   LDLCALC 59 07/03/2018 1435    CBC    Component Value Date/Time   WBC 2.7 (L) 07/03/2018 1435   RBC 4.90 07/03/2018 1435   HGB 15.0 07/03/2018 1435   HCT 45.7 07/03/2018 1435   PLT 193.0 07/03/2018 1435   MCV 93.2 07/03/2018 1435   MCH 30.7 06/20/2017 1605   MCHC 32.8 07/03/2018 1435   RDW 14.0 07/03/2018 1435   LYMPHSABS 1.1 06/20/2017 1605   MONOABS  0.2 06/20/2017 1605   EOSABS 0.1 06/20/2017 1605   BASOSABS 0.0 06/20/2017 1605    Hgb A1C Lab Results  Component Value Date   HGBA1C 5.4 11/26/2016            Assessment & Plan:   Tension Headache:  Toradol 30mg  IM today Rx sent for Zanaflex 2mg  daily before bed- sedation caution given Encouraged heat and massage  Return precautions discussed  Webb Silversmith, NP This visit occurred during the SARS-CoV-2 public health emergency.  Safety protocols were in place, including screening questions prior to the visit, additional usage of staff PPE, and extensive cleaning of exam room while observing appropriate contact time as indicated for disinfecting solutions.

## 2019-06-04 ENCOUNTER — Other Ambulatory Visit: Payer: Self-pay

## 2019-06-04 MED ORDER — TADALAFIL 20 MG PO TABS: ORAL_TABLET | ORAL | 2 refills | Status: DC

## 2019-06-04 NOTE — Telephone Encounter (Signed)
Last filled 06/2017 with 2 refills... please advise

## 2019-06-16 DIAGNOSIS — H401112 Primary open-angle glaucoma, right eye, moderate stage: Secondary | ICD-10-CM | POA: Diagnosis not present

## 2019-06-16 DIAGNOSIS — H401121 Primary open-angle glaucoma, left eye, mild stage: Secondary | ICD-10-CM | POA: Diagnosis not present

## 2019-07-07 ENCOUNTER — Ambulatory Visit (INDEPENDENT_AMBULATORY_CARE_PROVIDER_SITE_OTHER): Payer: Medicare Other | Admitting: Internal Medicine

## 2019-07-07 ENCOUNTER — Other Ambulatory Visit: Payer: Self-pay

## 2019-07-07 ENCOUNTER — Encounter: Payer: Self-pay | Admitting: Internal Medicine

## 2019-07-07 VITALS — BP 126/78 | HR 49 | Temp 97.6°F | Ht 70.0 in | Wt 204.0 lb

## 2019-07-07 DIAGNOSIS — Z125 Encounter for screening for malignant neoplasm of prostate: Secondary | ICD-10-CM | POA: Diagnosis not present

## 2019-07-07 DIAGNOSIS — E7800 Pure hypercholesterolemia, unspecified: Secondary | ICD-10-CM

## 2019-07-07 DIAGNOSIS — I10 Essential (primary) hypertension: Secondary | ICD-10-CM | POA: Diagnosis not present

## 2019-07-07 DIAGNOSIS — Z0000 Encounter for general adult medical examination without abnormal findings: Secondary | ICD-10-CM | POA: Diagnosis not present

## 2019-07-07 DIAGNOSIS — N529 Male erectile dysfunction, unspecified: Secondary | ICD-10-CM | POA: Diagnosis not present

## 2019-07-07 DIAGNOSIS — H6121 Impacted cerumen, right ear: Secondary | ICD-10-CM

## 2019-07-07 DIAGNOSIS — B182 Chronic viral hepatitis C: Secondary | ICD-10-CM

## 2019-07-07 DIAGNOSIS — I639 Cerebral infarction, unspecified: Secondary | ICD-10-CM | POA: Diagnosis not present

## 2019-07-07 DIAGNOSIS — G8194 Hemiplegia, unspecified affecting left nondominant side: Secondary | ICD-10-CM | POA: Diagnosis not present

## 2019-07-07 NOTE — Assessment & Plan Note (Signed)
Persistent Will monitor

## 2019-07-07 NOTE — Patient Instructions (Signed)
Health Maintenance After Age 72 After age 72, you are at a higher risk for certain long-term diseases and infections as well as injuries from falls. Falls are a major cause of broken bones and head injuries in people who are older than age 72. Getting regular preventive care can help to keep you healthy and well. Preventive care includes getting regular testing and making lifestyle changes as recommended by your health care provider. Talk with your health care provider about:  Which screenings and tests you should have. A screening is a test that checks for a disease when you have no symptoms.  A diet and exercise plan that is right for you. What should I know about screenings and tests to prevent falls? Screening and testing are the best ways to find a health problem early. Early diagnosis and treatment give you the best chance of managing medical conditions that are common after age 72. Certain conditions and lifestyle choices may make you more likely to have a fall. Your health care provider may recommend:  Regular vision checks. Poor vision and conditions such as cataracts can make you more likely to have a fall. If you wear glasses, make sure to get your prescription updated if your vision changes.  Medicine review. Work with your health care provider to regularly review all of the medicines you are taking, including over-the-counter medicines. Ask your health care provider about any side effects that may make you more likely to have a fall. Tell your health care provider if any medicines that you take make you feel dizzy or sleepy.  Osteoporosis screening. Osteoporosis is a condition that causes the bones to get weaker. This can make the bones weak and cause them to break more easily.  Blood pressure screening. Blood pressure changes and medicines to control blood pressure can make you feel dizzy.  Strength and balance checks. Your health care provider may recommend certain tests to check your  strength and balance while standing, walking, or changing positions.  Foot health exam. Foot pain and numbness, as well as not wearing proper footwear, can make you more likely to have a fall.  Depression screening. You may be more likely to have a fall if you have a fear of falling, feel emotionally low, or feel unable to do activities that you used to do.  Alcohol use screening. Using too much alcohol can affect your balance and may make you more likely to have a fall. What actions can I take to lower my risk of falls? General instructions  Talk with your health care provider about your risks for falling. Tell your health care provider if: ? You fall. Be sure to tell your health care provider about all falls, even ones that seem minor. ? You feel dizzy, sleepy, or off-balance.  Take over-the-counter and prescription medicines only as told by your health care provider. These include any supplements.  Eat a healthy diet and maintain a healthy weight. A healthy diet includes low-fat dairy products, low-fat (lean) meats, and fiber from whole grains, beans, and lots of fruits and vegetables. Home safety  Remove any tripping hazards, such as rugs, cords, and clutter.  Install safety equipment such as grab bars in bathrooms and safety rails on stairs.  Keep rooms and walkways well-lit. Activity   Follow a regular exercise program to stay fit. This will help you maintain your balance. Ask your health care provider what types of exercise are appropriate for you.  If you need a cane or   walker, use it as recommended by your health care provider.  Wear supportive shoes that have nonskid soles. Lifestyle  Do not drink alcohol if your health care provider tells you not to drink.  If you drink alcohol, limit how much you have: ? 0-1 drink a day for women. ? 0-2 drinks a day for men.  Be aware of how much alcohol is in your drink. In the [place] one drink equals one typical bottle of beer (12  oz), one-half glass of wine (5 oz), or one shot of hard liquor (1 oz).  Do not use any products that contain nicotine or tobacco, such as cigarettes and e-cigarettes. If you need help quitting, ask your health care provider. Summary  Having a healthy lifestyle and getting preventive care can help to protect your health and wellness after age 72.  Screening and testing are the best way to find a health problem early and help you avoid having a fall. Early diagnosis and treatment give you the best chance for managing medical conditions that are more common for people who are older than age 72.  Falls are a major cause of broken bones and head injuries in people who are older than age 72. Take precautions to prevent a fall at home.  Work with your health care provider to learn what changes you can make to improve your health and wellness and to prevent falls. This information is not intended to replace advice given to you by your health care provider. Make sure you discuss any questions you have with your health care provider. Document Revised: 08/13/2018 Document Reviewed: 03/05/2017 Elsevier Patient Education  2020 [employer]  

## 2019-07-07 NOTE — Assessment & Plan Note (Addendum)
With residual left hemiparesis Continue Amlodipine, Atorvastatin and Aspirin CBC, C met lipid profile today

## 2019-07-07 NOTE — Progress Notes (Signed)
HPI:  Pt presents to the clinic today for her subsequent annual Medicare Wellness Exam. He is also due to follow up chronic conditions.  HTN: His BP today is 126/78. He is taking Amlodipine as prescribed. ECG from 11/2016 reviewed.  HLD s/p Stroke with Left Hemiparesis: His last LDL was 59, 06/2018. He denies myalgias on Atorvastatin and ASA. He does not follow with neurology.  ED: Managed with Cialis as needed.   Past Medical History:  Diagnosis Date  . Glaucoma   . Hepatitis C   . Stroke [facility])     Current Outpatient Medications  Medication Sig Dispense Refill  . amLODipine (NORVASC) 10 MG tablet Take 1 tablet (10 mg total) by mouth daily. 90 tablet 3  . aspirin 81 MG chewable tablet Chew 1 tablet (81 mg total) by mouth daily.    Marland Kitchen atorvastatin (LIPITOR) 40 MG tablet TAKE 1 TABLET (40 MG TOTAL) BY MOUTH DAILY AT 6 PM. 90 tablet 3  . tadalafil (CIALIS) 20 MG tablet TAKE 0.5-1 TABLETS (10-20 MG TOTAL) BY MOUTH EVERY OTHER DAY AS NEEDED FOR ERECTILE DYSFUNCTION. 10 tablet 2  . tizanidine (ZANAFLEX) 2 MG capsule Take 1 capsule (2 mg total) by mouth at bedtime as needed for muscle spasms. 10 capsule 0  . VYZULTA 0.024 % SOLN Apply 1 drop to eye at bedtime.      No current facility-administered medications for this visit.    No Known Allergies  Family History  Problem Relation Age of Onset  . Hypertension Mother   . Hypertension Father     Social History   Socioeconomic History  . Marital status: Married    Spouse name: Not on file  . Number of children: Not on file  . Years of education: Not on file  . Highest education level: Not on file  Occupational History  . Not on file  Tobacco Use  . Smoking status: Former Smoker    Packs/day: 0.50    Years: 15.00    Pack years: 7.50    Quit date: 1990    Years since quitting: 31.1  . Smokeless tobacco: Never Used  Substance and Sexual Activity  . Alcohol use: No    Alcohol/week: 0.0 standard drinks  . Drug use: No  .  Sexual activity: Yes    Partners: Female    Birth control/protection: None  Other Topics Concern  . Not on file  Social History Narrative  . Not on file   Social Determinants of Health   Financial Resource Strain:   . Difficulty of Paying Living Expenses: Not on file  Food Insecurity:   . Worried About Charity fundraiser in the Last Year: Not on file  . Ran Out of Food in the Last Year: Not on file  Transportation Needs:   . Lack of Transportation (Medical): Not on file  . Lack of Transportation (Non-Medical): Not on file  Physical Activity:   . Days of Exercise per Week: Not on file  . Minutes of Exercise per Session: Not on file  Stress:   . Feeling of Stress : Not on file  Social Connections:   . Frequency of Communication with Friends and Family: Not on file  . Frequency of Social Gatherings with Friends and Family: Not on file  . Attends Religious Services: Not on file  . Active Member of Clubs or Organizations: Not on file  . Attends Archivist Meetings: Not on file  . Marital Status: Not on file  Intimate Partner Violence:   . Fear of Current or Ex-Partner: Not on file  . Emotionally Abused: Not on file  . Physically Abused: Not on file  . Sexually Abused: Not on file    Hospitiliaztions: None  Health Maintenance:    Flu: 12/2018  Tetanus: 05/2012  Pneumovax: 10/2014  Prevnar: 07/2013  Zostavax: 02/2014  Shingrix: 09/2016, 12/2016  Covid: never  PSA: 06/2018  Colon Screening: 06/2018  Eye Doctor: annually  Dental Exam: biannually   Providers:   PCP: Webb Silversmith, NP-C  Gastroenterology: Dr. Vicente Males  Vascular: Dr. August Luz   I have personally reviewed and have noted:  1. The patient's medical and social history 2. Their use of alcohol, tobacco or illicit drugs 3. Their current medications and supplements 4. The patient's functional ability including ADL's, fall risks, home safety risks and hearing or visual impairment. 5. Diet and physical  activities 6. Evidence for depression or mood disorder  Subjective:   Review of Systems:   Constitutional: Denies fever, malaise, fatigue, headache or abrupt weight changes.  HEENT: Pt reports decreased hearing, right ear. Denies eye pain, eye redness, ear pain, ringing in the ears, wax buildup, runny nose, nasal congestion, bloody nose, or sore throat. Respiratory: Denies difficulty breathing, shortness of breath, cough or sputum production.   Cardiovascular: Denies chest pain, chest tightness, palpitations or swelling in the hands or feet.  Gastrointestinal: Denies abdominal pain, bloating, constipation, diarrhea or blood in the stool.  GU: Denies urgency, frequency, pain with urination, burning sensation, blood in urine, odor or discharge. Musculoskeletal: Pt reports left sided weakness. Denies decrease in range of motion, difficulty with gait, muscle pain or joint pain and swelling.  Skin: Denies redness, rashes, lesions or ulcercations.  Neurological: Pt reports numbness of right index finger. Denies dizziness, difficulty with memory, difficulty with speech or problems with balance and coordination.  Psych: Denies anxiety, depression, SI/HI.  No other specific complaints in a complete review of systems (except as listed in HPI above).  Objective:  PE:   BP 126/78   Pulse (!) 49   Temp 97.6 F (36.4 C) (Temporal)   Ht _0  (1.778 m)   Wt 204 lb (92.5 kg)   SpO2 98%   BMI 29.27 kg/m   Wt Readings from Last 3 Encounters:  06/01/19 203 lb (92.1 kg)  10/13/18 200 lb (90.7 kg)  07/03/18 198 lb (89.8 kg)    General: Appears his stated age, well developed, well nourished in NAD. Skin: Warm, dry and intact. No rashes, lesions or ulcerations noted. HEENT: Head: normal shape and size; Eyes: sclera white, no icterus, conjunctiva pink, PERRLA and EOMs intact; Ears: Right ear cerumen impaction;  Neck: Neck supple, trachea midline. No masses, lumps or thyromegaly present.   Cardiovascular: Normal rate and rhythm. S1,S2 noted.  No murmur, rubs or gallops noted. No JVD or BLE edema. No carotid bruits noted. Pulmonary/Chest: Normal effort and positive vesicular breath sounds. No respiratory distress. No wheezes, rales or ronchi noted.  Abdomen: Soft and nontender. Normal bowel sounds. No distention or masses noted. Liver, spleen and kidneys non palpable. Musculoskeletal:  Strength 5/5 RUE/RLE. Strength 4/5 LUE/LLE. No signs of joint swelling.  Neurological: Alert and oriented. Cranial nerves II-XII grossly intact. Coordination normal.  Psychiatric: Mood and affect normal. Behavior is normal. Judgment and thought content normal.     BMET    Component Value Date/Time   NA 139 07/03/2018 1435   K 4.4 07/03/2018 1435   CL 104 07/03/2018 1435  CO2 28 07/03/2018 1435   GLUCOSE 61 (L) 07/03/2018 1435   BUN 13 07/03/2018 1435   CREATININE 0.96 07/03/2018 1435   CALCIUM 9.1 07/03/2018 1435   GFRNONAA >60 12/19/2016 0406   GFRAA >60 12/19/2016 0406    Lipid Panel     Component Value Date/Time   CHOL 129 07/03/2018 1435   TRIG 59.0 07/03/2018 1435   HDL 59.10 07/03/2018 1435   CHOLHDL 2 07/03/2018 1435   VLDL 11.8 07/03/2018 1435   LDLCALC 59 07/03/2018 1435    CBC    Component Value Date/Time   WBC 2.7 (L) 07/03/2018 1435   RBC 4.90 07/03/2018 1435   HGB 15.0 07/03/2018 1435   HCT 45.7 07/03/2018 1435   PLT 193.0 07/03/2018 1435   MCV 93.2 07/03/2018 1435   MCH 30.7 06/20/2017 1605   MCHC 32.8 07/03/2018 1435   RDW 14.0 07/03/2018 1435   LYMPHSABS 1.1 06/20/2017 1605   MONOABS 0.2 06/20/2017 1605   EOSABS 0.1 06/20/2017 1605   BASOSABS 0.0 06/20/2017 1605    Hgb A1C Lab Results  Component Value Date   HGBA1C 5.4 11/26/2016      Assessment and Plan:   Medicare Annual Wellness Visit:  Diet: He does eat lean meat. He consumes fruits and veggies daily. He tries to avoid fried foods. He drinks mostly water. Physical activity: Body  weight exercises at home, golf. Depression/mood screen: Negative, PHQ 9 score of 0 Hearing: Decreased hearing Visual acuity: Grossly normal, performs annual eye exam  ADLs: Capable Fall risk: None Home safety: Good Cognitive evaluation: Intact to orientation, naming, recall and repetition EOL planning: No adv directives, full code/ I agree  Preventative Medicine: Flu, Tetanus, Pneumovax, Prevnar, Zostavax and Shingrix UTD.  He does plan on getting his Covid vaccine.  Colon screening UTD.  Encouraged him to consume a balanced diet and exercise regimen.  Advised him to see an eye doctor and dentist annually.  Will check CBC, C met, lipid and PSA today.  Due dates for screening exams given to patient as part of his AVS.  Right Ear Cerumen Impaction:  Manual lavage by CMA Next appointment: 1 year Medicare wellness exam.   Webb Silversmith, NP This visit occurred during the SARS-CoV-2 public health emergency.  Safety protocols were in place, including screening questions prior to the visit, additional usage of staff PPE, and extensive cleaning of exam room while observing appropriate contact time as indicated for disinfecting solutions.

## 2019-07-07 NOTE — Assessment & Plan Note (Signed)
In remission C met today

## 2019-07-07 NOTE — Assessment & Plan Note (Signed)
Continue Cialis as needed 

## 2019-07-07 NOTE — Assessment & Plan Note (Signed)
Controlled on Amlodipine C met today

## 2019-07-07 NOTE — Assessment & Plan Note (Signed)
C met and lipid profile today Encouraged him to consume a low-fat diet Continue Atorvastatin

## 2019-07-08 DIAGNOSIS — H401121 Primary open-angle glaucoma, left eye, mild stage: Secondary | ICD-10-CM | POA: Diagnosis not present

## 2019-07-08 LAB — COMPREHENSIVE METABOLIC PANEL
ALT: 30 U/L (ref 0–53)
AST: 26 U/L (ref 0–37)
Albumin: 4.2 g/dL (ref 3.5–5.2)
Alkaline Phosphatase: 71 U/L (ref 39–117)
BUN: 11 mg/dL (ref 6–23)
CO2: 25 mEq/L (ref 19–32)
Calcium: 9 mg/dL (ref 8.4–10.5)
Chloride: 105 mEq/L (ref 96–112)
Creatinine, Ser: 0.97 mg/dL (ref 0.40–1.50)
GFR: 92.21 mL/min (ref >60.00)
Glucose, Bld: 65 mg/dL — ABNORMAL LOW (ref 70–99)
Potassium: 4.2 mEq/L (ref 3.5–5.1)
Sodium: 139 mEq/L (ref 135–145)
Total Bilirubin: 1 mg/dL (ref 0.2–1.2)
Total Protein: 7.1 g/dL (ref 6.0–8.3)

## 2019-07-08 LAB — CBC
HCT: 46 % (ref 39.0–52.0)
Hemoglobin: 15.3 g/dL (ref 13.0–17.0)
MCHC: 33.4 g/dL (ref 30.0–36.0)
MCV: 92.8 fl (ref 78.0–100.0)
Platelets: 164 10*3/uL (ref 150.0–400.0)
RBC: 4.95 Mil/uL (ref 4.22–5.81)
RDW: 13.5 % (ref 11.5–15.5)
WBC: 2.7 10*3/uL — ABNORMAL LOW (ref 4.0–10.5)

## 2019-07-08 LAB — LIPID PANEL
Cholesterol: 111 mg/dL (ref 0–200)
HDL: 53.5 mg/dL (ref >39.00)
LDL Cholesterol: 48 mg/dL (ref 0–99)
NonHDL: 57.9
Total CHOL/HDL Ratio: 2
Triglycerides: 48 mg/dL (ref 0.0–149.0)
VLDL: 9.6 mg/dL (ref 0.0–40.0)

## 2019-07-08 LAB — PSA, MEDICARE: PSA: 0.44 ng/ml (ref 0.10–4.00)

## 2019-07-29 ENCOUNTER — Other Ambulatory Visit: Payer: Self-pay | Admitting: Internal Medicine

## 2019-07-29 DIAGNOSIS — I10 Essential (primary) hypertension: Secondary | ICD-10-CM

## 2019-08-30 ENCOUNTER — Telehealth (HOSPITAL_COMMUNITY): Payer: Self-pay

## 2019-08-30 ENCOUNTER — Other Ambulatory Visit (HOSPITAL_COMMUNITY): Payer: Self-pay | Admitting: Interventional Radiology

## 2019-08-30 DIAGNOSIS — I771 Stricture of artery: Secondary | ICD-10-CM

## 2019-08-30 DIAGNOSIS — I639 Cerebral infarction, unspecified: Secondary | ICD-10-CM

## 2019-08-30 NOTE — Telephone Encounter (Signed)
Called to schedule us carotid, no answer, left vm. AW  

## 2019-09-02 ENCOUNTER — Encounter: Payer: Self-pay | Admitting: Internal Medicine

## 2019-09-03 ENCOUNTER — Other Ambulatory Visit: Payer: Self-pay

## 2019-09-03 ENCOUNTER — Encounter: Payer: Self-pay | Admitting: Internal Medicine

## 2019-09-03 ENCOUNTER — Ambulatory Visit (INDEPENDENT_AMBULATORY_CARE_PROVIDER_SITE_OTHER): Payer: Medicare Other | Admitting: Internal Medicine

## 2019-09-03 ENCOUNTER — Ambulatory Visit (INDEPENDENT_AMBULATORY_CARE_PROVIDER_SITE_OTHER)
Admission: RE | Admit: 2019-09-03 | Discharge: 2019-09-03 | Disposition: A | Discharge status: Home / Self Care | Payer: Medicare Other | Source: Ambulatory Visit | Attending: Internal Medicine | Admitting: Internal Medicine

## 2019-09-03 VITALS — BP 124/76 | HR 62 | Temp 97.9°F | Wt 198.0 lb

## 2019-09-03 DIAGNOSIS — M25462 Effusion, left knee: Secondary | ICD-10-CM

## 2019-09-03 DIAGNOSIS — M25562 Pain in left knee: Secondary | ICD-10-CM

## 2019-09-03 DIAGNOSIS — I639 Cerebral infarction, unspecified: Secondary | ICD-10-CM | POA: Diagnosis not present

## 2019-09-03 DIAGNOSIS — M1712 Unilateral primary osteoarthritis, left knee: Secondary | ICD-10-CM | POA: Diagnosis not present

## 2019-09-03 DIAGNOSIS — M7989 Other specified soft tissue disorders: Secondary | ICD-10-CM | POA: Diagnosis not present

## 2019-09-03 MED ORDER — NAPROXEN 500 MG PO TABS: 500.0000 mg | ORAL_TABLET | Freq: Two times a day (BID) | ORAL | 0 refills | Status: DC

## 2019-09-03 NOTE — Progress Notes (Signed)
Subjective:    Patient ID: Jason Nolan, male    DOB: [birth year], 74 y.o.   MRN: [id number]  HPI  Pt presents to the clinic today with c/o left knee swelling and pain. This started 5 days ago. He describes the pain as sharp. The pain is worse with certain movements, like lateral movement or twisting and with ambulation.Marland Kitchen He just started back using an exercise band and has not used this in a while but he denies any specific injury to the area. He has used a brace with minimal relief.  Review of Systems      Past Medical History:  Diagnosis Date  . Glaucoma   . Hepatitis C   . Stroke [facility])     Current Outpatient Medications  Medication Sig Dispense Refill  . amLODipine (NORVASC) 10 MG tablet TAKE 1 TABLET BY MOUTH EVERY DAY 90 tablet 2  . aspirin 81 MG chewable tablet Chew 1 tablet (81 mg total) by mouth daily.    Marland Kitchen atorvastatin (LIPITOR) 40 MG tablet TAKE 1 TABLET (40 MG TOTAL) BY MOUTH DAILY AT 6 PM. 90 tablet 3  . tadalafil (CIALIS) 20 MG tablet TAKE 0.5-1 TABLETS (10-20 MG TOTAL) BY MOUTH EVERY OTHER DAY AS NEEDED FOR ERECTILE DYSFUNCTION. 10 tablet 2  . VYZULTA 0.024 % SOLN Apply 1 drop to eye at bedtime.      No current facility-administered medications for this visit.    No Known Allergies  Family History  Problem Relation Age of Onset  . Hypertension Mother   . Hypertension Father     Social History   Socioeconomic History  . Marital status: Married    Spouse name: Not on file  . Number of children: Not on file  . Years of education: Not on file  . Highest education level: Not on file  Occupational History  . Not on file  Tobacco Use  . Smoking status: Former Smoker    Packs/day: 0.50    Years: 15.00    Pack years: 7.50    Quit date: 1990    Years since quitting: 31.3  . Smokeless tobacco: Never Used  Substance and Sexual Activity  . Alcohol use: No    Alcohol/week: 0.0 standard drinks  . Drug use: No  . Sexual activity: Yes    Partners: Female      Birth control/protection: None  Other Topics Concern  . Not on file  Social History Narrative  . Not on file   Social Determinants of Health   Financial Resource Strain:   . Difficulty of Paying Living Expenses:   Food Insecurity:   . Worried About Charity fundraiser in the Last Year:   . Arboriculturist in the Last Year:   Transportation Needs:   . Film/video editor (Medical):   Marland Kitchen Lack of Transportation (Non-Medical):   Physical Activity:   . Days of Exercise per Week:   . Minutes of Exercise per Session:   Stress:   . Feeling of Stress :   Social Connections:   . Frequency of Communication with Friends and Family:   . Frequency of Social Gatherings with Friends and Family:   . Attends Religious Services:   . Active Member of Clubs or Organizations:   . Attends Archivist Meetings:   Marland Kitchen Marital Status:   Intimate Partner Violence:   . Fear of Current or Ex-Partner:   . Emotionally Abused:   Marland Kitchen Physically Abused:   . Sexually  Abused:      Constitutional: Denies fever, malaise, fatigue, headache or abrupt weight changes.  Respiratory: Denies difficulty breathing, shortness of breath, cough or sputum production.   Cardiovascular: Denies chest pain, chest tightness, palpitations or swelling in the hands or feet.  Musculoskeletal: Pt reports left knee pain and swelling. Denies decrease in range of motion, difficulty with gait, muscle pain.  Skin: Denies redness, rashes, lesions or ulcercations.  No other specific complaints in a complete review of systems (except as listed in HPI above).  Objective:   Physical Exam BP 124/76   Pulse 62   Temp 97.9 F (36.6 C) (Temporal)   Wt 198 lb (89.8 kg)   SpO2 98%   BMI 28.41 kg/m   Wt Readings from Last 3 Encounters:  07/07/19 204 lb (92.5 kg)  06/01/19 203 lb (92.1 kg)  10/13/18 200 lb (90.7 kg)    General: Appears his stated age, well developed, well nourished in NAD. Skin: Warm, dry and intact. No  redness or warmth noted of left knee. Cardiovascular: Normal rate and rhythm.  Pulmonary/Chest: Normal effort.  Musculoskeletal: Normal flexion and extension of the left knee. Pain with palpation of the quadriceps tendon and medial joint line. 1+ swelling of the left knee. Negative Lachman's, negative McMurray. Strength 5/5 BLE. No difficulty with gait.  Neurological: Alert and oriented.    BMET    Component Value Date/Time   NA 139 07/07/2019 1518   K 4.2 07/07/2019 1518   CL 105 07/07/2019 1518   CO2 25 07/07/2019 1518   GLUCOSE 65 (L) 07/07/2019 1518   BUN 11 07/07/2019 1518   CREATININE 0.97 07/07/2019 1518   CALCIUM 9.0 07/07/2019 1518   GFRNONAA >60 12/19/2016 0406   GFRAA >60 12/19/2016 0406    Lipid Panel     Component Value Date/Time   CHOL 111 07/07/2019 1518   TRIG 48.0 07/07/2019 1518   HDL 53.50 07/07/2019 1518   CHOLHDL 2 07/07/2019 1518   VLDL 9.6 07/07/2019 1518   LDLCALC 48 07/07/2019 1518    CBC    Component Value Date/Time   WBC 2.7 (L) 07/07/2019 1518   RBC 4.95 07/07/2019 1518   HGB 15.3 07/07/2019 1518   HCT 46.0 07/07/2019 1518   PLT 164.0 07/07/2019 1518   MCV 92.8 07/07/2019 1518   MCH 30.7 06/20/2017 1605   MCHC 33.4 07/07/2019 1518   RDW 13.5 07/07/2019 1518   LYMPHSABS 1.1 06/20/2017 1605   MONOABS 0.2 06/20/2017 1605   EOSABS 0.1 06/20/2017 1605   BASOSABS 0.0 06/20/2017 1605    Hgb A1C Lab Results  Component Value Date   HGBA1C 5.4 11/26/2016            Assessment & Plan:   Left Knee Pain and Swelling:  Xray left knee today RX for Naproxen 500 mg BID x 5 days- with food Continue brace as needed  Will follow up after xray, return precautions dicscussed Nicki Reaper, NP This visit occurred during the SARS-CoV-2 public health emergency.  Safety protocols were in place, including screening questions prior to the visit, additional usage of staff PPE, and extensive cleaning of exam room while observing appropriate  contact time as indicated for disinfecting solutions.

## 2019-09-03 NOTE — Patient Instructions (Signed)
Journal for Nurse Practitioners, 15(4), 263-267. Retrieved February 09, 2018 from http://clinicalkey.com/nursing">  Knee Exercises Ask your health care provider which exercises are safe for you. Do exercises exactly as told by your health care provider and adjust them as directed. It is normal to feel mild stretching, pulling, tightness, or discomfort as you do these exercises. Stop right away if you feel sudden pain or your pain gets worse. Do not begin these exercises until told by your health care provider. Stretching and range-of-motion exercises These exercises warm up your muscles and joints and improve the movement and flexibility of your knee. These exercises also help to relieve pain and swelling. Knee extension, prone 1. Lie on your abdomen (prone position) on a bed. 2. Place your left / right knee just beyond the edge of the surface so your knee is not on the bed. You can put a towel under your left / right thigh just above your kneecap for comfort. 3. Relax your leg muscles and allow gravity to straighten your knee (extension). You should feel a stretch behind your left / right knee. 4. Hold this position for __________ seconds. 5. Scoot up so your knee is supported between repetitions. Repeat __________ times. Complete this exercise __________ times a day. Knee flexion, active  1. Lie on your back with both legs straight. If this causes back discomfort, bend your left / right knee so your foot is flat on the floor. 2. Slowly slide your left / right heel back toward your buttocks. Stop when you feel a gentle stretch in the front of your knee or thigh (flexion). 3. Hold this position for __________ seconds. 4. Slowly slide your left / right heel back to the starting position. Repeat __________ times. Complete this exercise __________ times a day. Quadriceps stretch, prone  1. Lie on your abdomen on a firm surface, such as a bed or padded floor. 2. Bend your left / right knee and hold  your ankle. If you cannot reach your ankle or pant leg, loop a belt around your foot and grab the belt instead. 3. Gently pull your heel toward your buttocks. Your knee should not slide out to the side. You should feel a stretch in the front of your thigh and knee (quadriceps). 4. Hold this position for __________ seconds. Repeat __________ times. Complete this exercise __________ times a day. Hamstring, supine 1. Lie on your back (supine position). 2. Loop a belt or towel over the ball of your left / right foot. The ball of your foot is on the walking surface, right under your toes. 3. Straighten your left / right knee and slowly pull on the belt to raise your leg until you feel a gentle stretch behind your knee (hamstring). ? Do not let your knee bend while you do this. ? Keep your other leg flat on the floor. 4. Hold this position for __________ seconds. Repeat __________ times. Complete this exercise __________ times a day. Strengthening exercises These exercises build strength and endurance in your knee. Endurance is the ability to use your muscles for a long time, even after they get tired. Quadriceps, isometric This exercise stretches the muscles in front of your thigh (quadriceps) without moving your knee joint (isometric). 1. Lie on your back with your left / right leg extended and your other knee bent. Put a rolled towel or small pillow under your knee if told by your health care provider. 2. Slowly tense the muscles in the front of your left /   right thigh. You should see your kneecap slide up toward your hip or see increased dimpling just above the knee. This motion will push the back of the knee toward the floor. 3. For __________ seconds, hold the muscle as tight as you can without increasing your pain. 4. Relax the muscles slowly and completely. Repeat __________ times. Complete this exercise __________ times a day. Straight leg raises This exercise stretches the muscles in front  of your thigh (quadriceps) and the muscles that move your hips (hip flexors). 1. Lie on your back with your left / right leg extended and your other knee bent. 2. Tense the muscles in the front of your left / right thigh. You should see your kneecap slide up or see increased dimpling just above the knee. Your thigh may even shake a bit. 3. Keep these muscles tight as you raise your leg 4-6 inches (10-15 cm) off the floor. Do not let your knee bend. 4. Hold this position for __________ seconds. 5. Keep these muscles tense as you lower your leg. 6. Relax your muscles slowly and completely after each repetition. Repeat __________ times. Complete this exercise __________ times a day. Hamstring, isometric 1. Lie on your back on a firm surface. 2. Bend your left / right knee about __________ degrees. 3. Dig your left / right heel into the surface as if you are trying to pull it toward your buttocks. Tighten the muscles in the back of your thighs (hamstring) to "dig" as hard as you can without increasing any pain. 4. Hold this position for __________ seconds. 5. Release the tension gradually and allow your muscles to relax completely for __________ seconds after each repetition. Repeat __________ times. Complete this exercise __________ times a day. Hamstring curls If told by your health care provider, do this exercise while wearing ankle weights. Begin with __________ lb weights. Then increase the weight by 1 lb (0.5 kg) increments. Do not wear ankle weights that are more than __________ lb. 1. Lie on your abdomen with your legs straight. 2. Bend your left / right knee as far as you can without feeling pain. Keep your hips flat against the floor. 3. Hold this position for __________ seconds. 4. Slowly lower your leg to the starting position. Repeat __________ times. Complete this exercise __________ times a day. Squats This exercise strengthens the muscles in front of your thigh and knee  (quadriceps). 1. Stand in front of a table, with your feet and knees pointing straight ahead. You may rest your hands on the table for balance but not for support. 2. Slowly bend your knees and lower your hips like you are going to sit in a chair. ? Keep your weight over your heels, not over your toes. ? Keep your lower legs upright so they are parallel with the table legs. ? Do not let your hips go lower than your knees. ? Do not bend lower than told by your health care provider. ? If your knee pain increases, do not bend as low. 3. Hold the squat position for __________ seconds. 4. Slowly push with your legs to return to standing. Do not use your hands to pull yourself to standing. Repeat __________ times. Complete this exercise __________ times a day. Wall slides This exercise strengthens the muscles in front of your thigh and knee (quadriceps). 1. Lean your back against a smooth wall or door, and walk your feet out 18-24 inches (46-61 cm) from it. 2. Place your feet hip-width apart. 3.   Slowly slide down the wall or door until your knees bend __________ degrees. Keep your knees over your heels, not over your toes. Keep your knees in line with your hips. 4. Hold this position for __________ seconds. Repeat __________ times. Complete this exercise __________ times a day. Straight leg raises This exercise strengthens the muscles that rotate the leg at the hip and move it away from your body (hip abductors). 1. Lie on your side with your left / right leg in the top position. Lie so your head, shoulder, knee, and hip line up. You may bend your bottom knee to help you keep your balance. 2. Roll your hips slightly forward so your hips are stacked directly over each other and your left / right knee is facing forward. 3. Leading with your heel, lift your top leg 4-6 inches (10-15 cm). You should feel the muscles in your outer hip lifting. ? Do not let your foot drift forward. ? Do not let your knee  roll toward the ceiling. 4. Hold this position for __________ seconds. 5. Slowly return your leg to the starting position. 6. Let your muscles relax completely after each repetition. Repeat __________ times. Complete this exercise __________ times a day. Straight leg raises This exercise stretches the muscles that move your hips away from the front of the pelvis (hip extensors). 1. Lie on your abdomen on a firm surface. You can put a pillow under your hips if that is more comfortable. 2. Tense the muscles in your buttocks and lift your left / right leg about 4-6 inches (10-15 cm). Keep your knee straight as you lift your leg. 3. Hold this position for __________ seconds. 4. Slowly lower your leg to the starting position. 5. Let your leg relax completely after each repetition. Repeat __________ times. Complete this exercise __________ times a day. This information is not intended to replace advice given to you by your health care provider. Make sure you discuss any questions you have with your health care provider. Document Revised: 02/10/2018 Document Reviewed: 02/10/2018 Elsevier Patient Education  2020 [employer]  

## 2019-09-10 ENCOUNTER — Other Ambulatory Visit: Payer: Self-pay

## 2019-09-10 ENCOUNTER — Ambulatory Visit (HOSPITAL_COMMUNITY)
Admission: RE | Admit: 2019-09-10 | Discharge: 2019-09-10 | Disposition: A | Discharge status: Home / Self Care | Payer: Medicare Other | Source: Ambulatory Visit | Attending: Interventional Radiology | Admitting: Interventional Radiology

## 2019-09-10 DIAGNOSIS — I639 Cerebral infarction, unspecified: Secondary | ICD-10-CM | POA: Diagnosis not present

## 2019-09-10 DIAGNOSIS — I771 Stricture of artery: Secondary | ICD-10-CM | POA: Insufficient documentation

## 2019-09-13 ENCOUNTER — Telehealth (HOSPITAL_COMMUNITY): Payer: Self-pay

## 2019-09-13 NOTE — Telephone Encounter (Signed)
Pt agreed to f/u in 6 months with us carotid. AW 

## 2019-10-01 ENCOUNTER — Other Ambulatory Visit: Payer: Self-pay | Admitting: Internal Medicine

## 2019-10-01 DIAGNOSIS — I639 Cerebral infarction, unspecified: Secondary | ICD-10-CM

## 2020-01-12 DIAGNOSIS — H401121 Primary open-angle glaucoma, left eye, mild stage: Secondary | ICD-10-CM | POA: Diagnosis not present

## 2020-01-20 DIAGNOSIS — Z23 Encounter for immunization: Secondary | ICD-10-CM | POA: Diagnosis not present

## 2020-02-08 DIAGNOSIS — Z23 Encounter for immunization: Secondary | ICD-10-CM | POA: Diagnosis not present

## 2020-04-06 ENCOUNTER — Encounter: Payer: Self-pay | Admitting: Internal Medicine

## 2020-04-18 ENCOUNTER — Other Ambulatory Visit: Payer: Self-pay | Admitting: Internal Medicine

## 2020-04-18 DIAGNOSIS — I10 Essential (primary) hypertension: Secondary | ICD-10-CM

## 2020-05-08 ENCOUNTER — Other Ambulatory Visit (HOSPITAL_COMMUNITY): Payer: Self-pay | Admitting: Interventional Radiology

## 2020-05-08 DIAGNOSIS — I771 Stricture of artery: Secondary | ICD-10-CM

## 2020-05-31 ENCOUNTER — Other Ambulatory Visit: Payer: Self-pay

## 2020-05-31 ENCOUNTER — Ambulatory Visit (HOSPITAL_COMMUNITY)
Admission: RE | Admit: 2020-05-31 | Discharge: 2020-05-31 | Disposition: A | Discharge status: Home / Self Care | Payer: Medicare Other | Source: Ambulatory Visit | Attending: Interventional Radiology | Admitting: Interventional Radiology

## 2020-05-31 DIAGNOSIS — I771 Stricture of artery: Secondary | ICD-10-CM

## 2020-05-31 NOTE — CV Procedure (Signed)
Carotid duplex completed.  Results can be found under chart review under CV PROC. 05/31/2020 2:47 PM Kortni Hasten RVT, RDMS

## 2020-06-18 ENCOUNTER — Other Ambulatory Visit: Payer: Self-pay | Admitting: Internal Medicine

## 2020-06-18 DIAGNOSIS — I639 Cerebral infarction, unspecified: Secondary | ICD-10-CM

## 2020-07-05 DIAGNOSIS — H401121 Primary open-angle glaucoma, left eye, mild stage: Secondary | ICD-10-CM | POA: Diagnosis not present

## 2020-07-11 ENCOUNTER — Other Ambulatory Visit: Payer: Self-pay | Admitting: Internal Medicine

## 2020-07-11 DIAGNOSIS — I10 Essential (primary) hypertension: Secondary | ICD-10-CM

## 2020-07-20 ENCOUNTER — Other Ambulatory Visit: Payer: Self-pay | Admitting: Internal Medicine

## 2020-07-21 NOTE — Telephone Encounter (Signed)
Last filled 06/03/2020 with 2 refills

## 2020-07-23 IMAGING — DX DG KNEE COMPLETE 4+V*L*
4 series · 4 of 4 positions shown · non-contrast
Comparison: None.

CLINICAL DATA: Left knee swelling.

EXAM:
LEFT KNEE - COMPLETE 4+ VIEW

[knee ap]
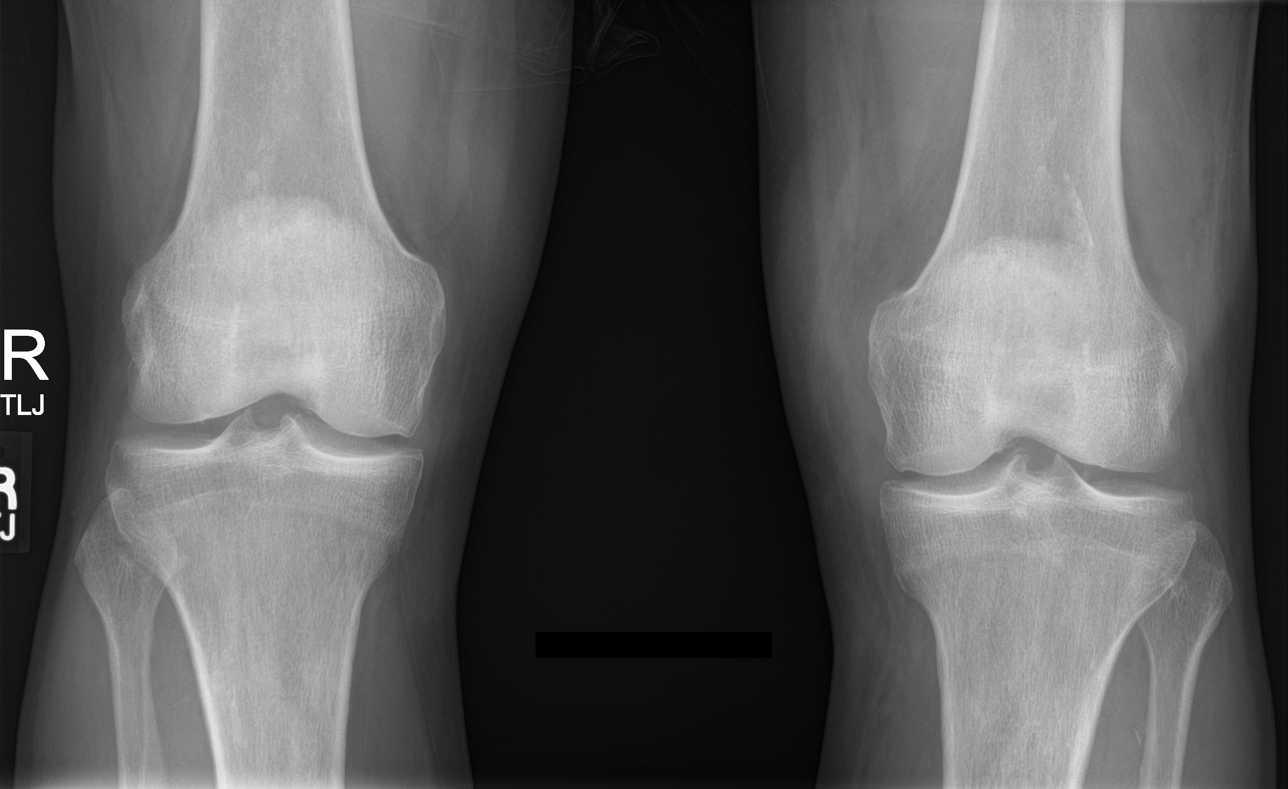

[knee tunnel]
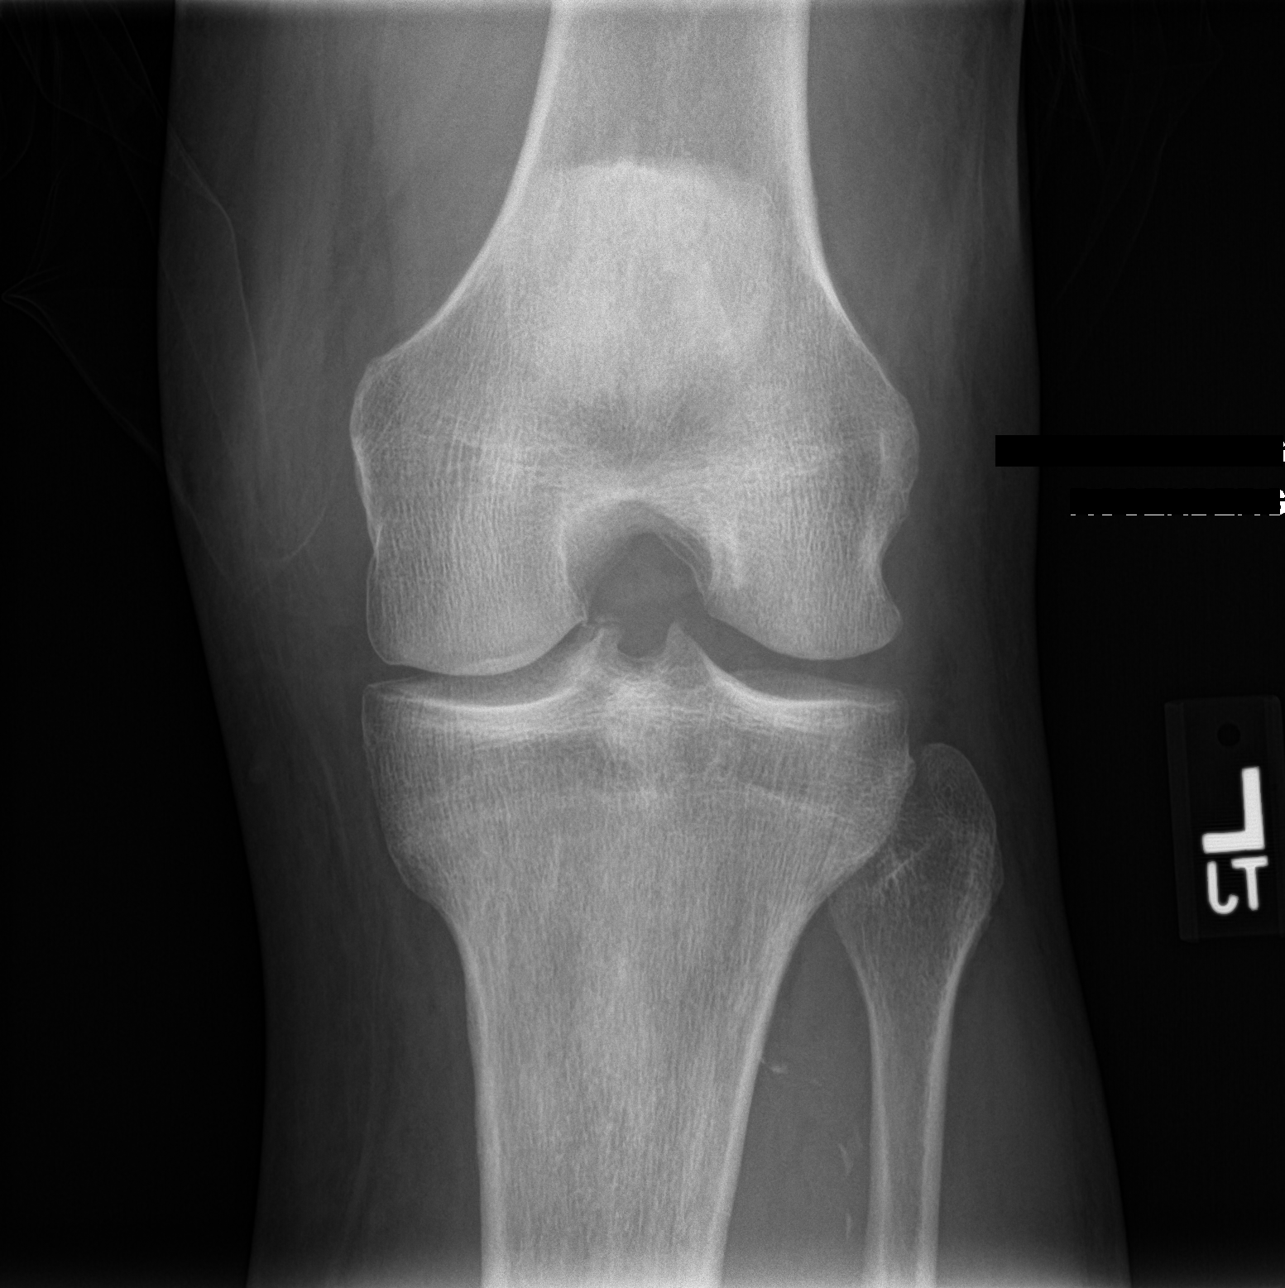

[knee lat]
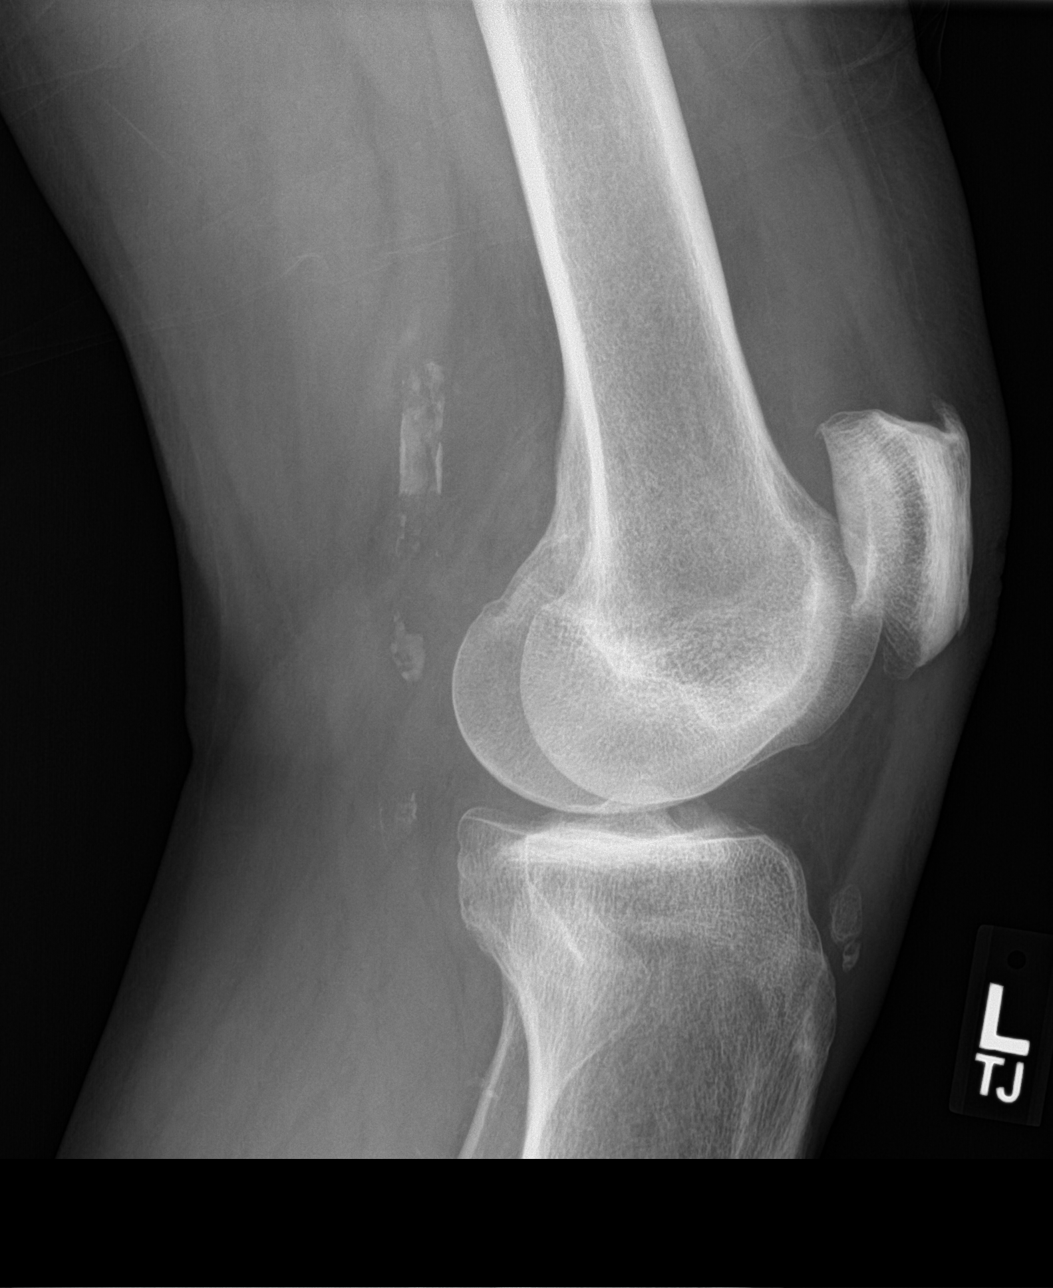

[patella skyline]
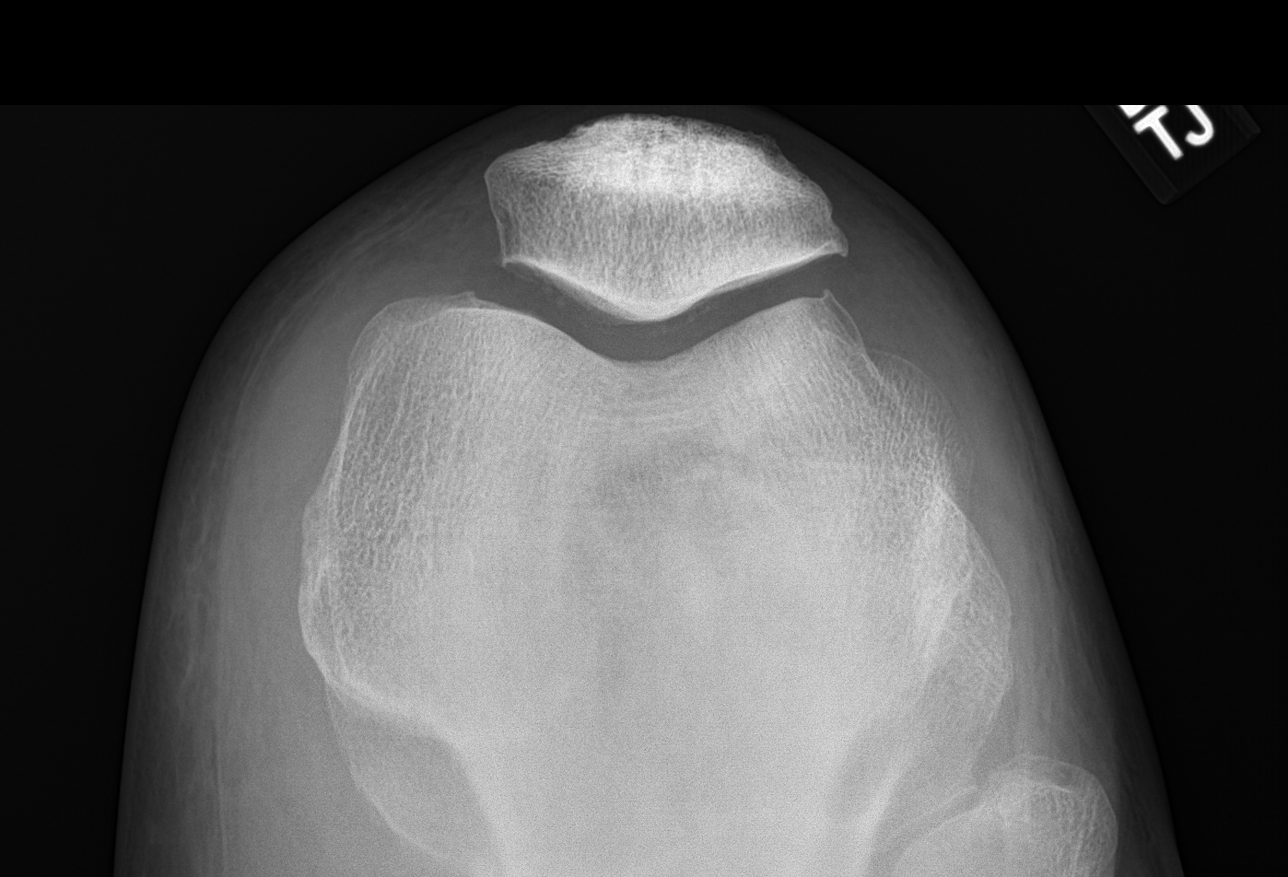

[4 of 4 positions shown; findings below may reference images not displayed]

FINDINGS: Minimal osteoarthritic change of the medial compartment and
patellofemoral joints. No evidence of fracture or dislocation. No
significant joint effusion. Calcified plaque over the distal femoral
and popliteal arteries. Mild degenerate change over the medial
compartment of the visualized right knee.
IMPRESSION: No acute findings.

Mild osteoarthritic change.

## 2020-07-26 DIAGNOSIS — H401112 Primary open-angle glaucoma, right eye, moderate stage: Secondary | ICD-10-CM | POA: Diagnosis not present

## 2020-08-01 ENCOUNTER — Ambulatory Visit (INDEPENDENT_AMBULATORY_CARE_PROVIDER_SITE_OTHER): Payer: Medicare Other | Admitting: Internal Medicine

## 2020-08-01 ENCOUNTER — Other Ambulatory Visit: Payer: Self-pay

## 2020-08-01 ENCOUNTER — Encounter: Payer: Self-pay | Admitting: Internal Medicine

## 2020-08-01 VITALS — BP 126/82 | HR 82 | Temp 96.3°F | Ht 70.0 in | Wt 202.0 lb

## 2020-08-01 DIAGNOSIS — Z0000 Encounter for general adult medical examination without abnormal findings: Secondary | ICD-10-CM

## 2020-08-01 DIAGNOSIS — G8194 Hemiplegia, unspecified affecting left nondominant side: Secondary | ICD-10-CM | POA: Diagnosis not present

## 2020-08-01 DIAGNOSIS — N529 Male erectile dysfunction, unspecified: Secondary | ICD-10-CM | POA: Diagnosis not present

## 2020-08-01 DIAGNOSIS — E7800 Pure hypercholesterolemia, unspecified: Secondary | ICD-10-CM

## 2020-08-01 DIAGNOSIS — Z125 Encounter for screening for malignant neoplasm of prostate: Secondary | ICD-10-CM

## 2020-08-01 DIAGNOSIS — I10 Essential (primary) hypertension: Secondary | ICD-10-CM | POA: Diagnosis not present

## 2020-08-01 DIAGNOSIS — R6889 Other general symptoms and signs: Secondary | ICD-10-CM

## 2020-08-01 DIAGNOSIS — I639 Cerebral infarction, unspecified: Secondary | ICD-10-CM

## 2020-08-01 LAB — LIPID PANEL
Cholesterol: 120 mg/dL (ref 0–200)
HDL: 54.9 mg/dL (ref >39.00)
LDL Cholesterol: 48 mg/dL (ref 0–99)
NonHDL: 64.75
Total CHOL/HDL Ratio: 2
Triglycerides: 86 mg/dL (ref 0.0–149.0)
VLDL: 17.2 mg/dL (ref 0.0–40.0)

## 2020-08-01 LAB — COMPREHENSIVE METABOLIC PANEL
ALT: 26 U/L (ref 0–53)
AST: 21 U/L (ref 0–37)
Albumin: 4.3 g/dL (ref 3.5–5.2)
Alkaline Phosphatase: 67 U/L (ref 39–117)
BUN: 13 mg/dL (ref 6–23)
CO2: 25 mEq/L (ref 19–32)
Calcium: 8.9 mg/dL (ref 8.4–10.5)
Chloride: 105 mEq/L (ref 96–112)
Creatinine, Ser: 0.88 mg/dL (ref 0.40–1.50)
GFR: 85.94 mL/min (ref >60.00)
Glucose, Bld: 84 mg/dL (ref 70–99)
Potassium: 4.1 mEq/L (ref 3.5–5.1)
Sodium: 139 mEq/L (ref 135–145)
Total Bilirubin: 0.9 mg/dL (ref 0.2–1.2)
Total Protein: 7 g/dL (ref 6.0–8.3)

## 2020-08-01 LAB — CBC
HCT: 44.9 % (ref 39.0–52.0)
Hemoglobin: 15.1 g/dL (ref 13.0–17.0)
MCHC: 33.6 g/dL (ref 30.0–36.0)
MCV: 92.6 fl (ref 78.0–100.0)
Platelets: 147 10*3/uL — ABNORMAL LOW (ref 150.0–400.0)
RBC: 4.85 Mil/uL (ref 4.22–5.81)
RDW: 13.5 % (ref 11.5–15.5)
WBC: 2.3 10*3/uL — ABNORMAL LOW (ref 4.0–10.5)

## 2020-08-01 LAB — TSH: TSH: 1.12 u[IU]/mL (ref 0.35–4.50)

## 2020-08-01 LAB — PSA, MEDICARE: PSA: 0.25 ng/ml (ref 0.10–4.00)

## 2020-08-01 MED ORDER — SILDENAFIL CITRATE 50 MG PO TABS: 50.0000 mg | ORAL_TABLET | Freq: Every day | ORAL | 5 refills | Status: DC | PRN

## 2020-08-01 NOTE — Assessment & Plan Note (Signed)
CMET and Lipid profile today Encouraged him to consume a low fat diet Continue Atorvastatin 

## 2020-08-01 NOTE — Progress Notes (Signed)
HPI:  Patient presents the clinic today for his subsequent annual Medicare wellness exam.  He is also due to follow-up chronic conditions.  Hx of Stroke: With residual left-sided weakness.  His last LDL was 48, 07/2019.  He is taking amlodipine, atorvastatin and aspirin as prescribed.He is not following with neurology.  ED: He reports he is unable to afford Cialis.  He is wondering if there is an alternative medication that would be cheaper.  HTN: His BP today is 126/82. He is taking Amlodipine as prescribed. ECG from 11/2016 reviewed.  Past Medical History:  Diagnosis Date  . Glaucoma   . Hepatitis C   . Stroke [facility])     Current Outpatient Medications  Medication Sig Dispense Refill  . amLODipine (NORVASC) 10 MG tablet TAKE 1 TABLET BY MOUTH EVERY DAY 90 tablet 0  . aspirin 81 MG chewable tablet Chew 1 tablet (81 mg total) by mouth daily.    Marland Kitchen atorvastatin (LIPITOR) 40 MG tablet TAKE 1 TABLET (40 MG TOTAL) BY MOUTH DAILY AT 6 PM. 90 tablet 0  . naproxen (NAPROSYN) 500 MG tablet Take 1 tablet (500 mg total) by mouth 2 (two) times daily with a meal. 14 tablet 0  . tadalafil (CIALIS) 20 MG tablet TAKE 0.5-1 TABLETS (10-20 MG TOTAL) BY MOUTH EVERY OTHER DAY AS NEEDED FOR ERECTILE DYSFUNCTION. 10 tablet 2  . VYZULTA 0.024 % SOLN Apply 1 drop to eye at bedtime.      No current facility-administered medications for this visit.    No Known Allergies  Family History  Problem Relation Age of Onset  . Hypertension Mother   . Hypertension Father     Social History   Socioeconomic History  . Marital status: Married    Spouse name: Not on file  . Number of children: Not on file  . Years of education: Not on file  . Highest education level: Not on file  Occupational History  . Not on file  Tobacco Use  . Smoking status: Former Smoker    Packs/day: 0.50    Years: 15.00    Pack years: 7.50    Quit date: 1990    Years since quitting: 32.2  . Smokeless tobacco: Never Used  Vaping  Use  . Vaping Use: Never used  Substance and Sexual Activity  . Alcohol use: No    Alcohol/week: 0.0 standard drinks  . Drug use: No  . Sexual activity: Yes    Partners: Female    Birth control/protection: None  Other Topics Concern  . Not on file  Social History Narrative  . Not on file   Social Determinants of Health   Financial Resource Strain: Not on file  Food Insecurity: Not on file  Transportation Needs: Not on file  Physical Activity: Not on file  Stress: Not on file  Social Connections: Not on file  Intimate Partner Violence: Not on file    Hospitiliaztions: None  Health Maintenance:    Flu: 01/2020  Tetanus: 05/2012  Pneumovax: 2016  Prevnar: 2015  Zostavax: 2015  Shingrix: 2018  PSA: 07/2019  Colon Screening: 06/2018  Eye Doctor: Annually  Dental Exam: Biannually   Providers:   PCP: Webb Silversmith, NP    I have personally reviewed and have noted:  1. The patient's medical and social history 2. Their use of alcohol, tobacco or illicit drugs 3. Their current medications and supplements 4. The patient's functional ability including ADL's, fall risks, home safety risks and  hearing or visual  impairment. 5. Diet and physical activities 6. Evidence for depression or mood disorder  Subjective:   Review of Systems:   Constitutional: Denies fever, malaise, fatigue, headache or abrupt weight changes.  HEENT: Patient reports blurred vision, ringing in the ears.  Denies eye pain, eye redness, ear pain, wax buildup, runny nose, nasal congestion, bloody nose, or sore throat. Respiratory: Denies difficulty breathing, shortness of breath, cough or sputum production.   Cardiovascular: Denies chest pain, chest tightness, palpitations or swelling in the hands or feet.  Gastrointestinal: Patient reports intermittent constipation.  Denies abdominal pain, bloating, diarrhea or blood in the stool.  GU: Denies urgency, frequency, pain with urination, burning sensation,  blood in urine, odor or discharge. Musculoskeletal: Patient reports left-sided weakness.  Denies decrease in range of motion, difficulty with gait, muscle pain or joint pain and swelling.  Skin: Patient reports cold intolerance.  Denies redness, rashes, lesions or ulcercations.  Neurological: Denies dizziness, difficulty with memory, difficulty with speech or problems with balance and coordination.  Psych: Denies anxiety, depression, SI/HI.  No other specific complaints in a complete review of systems (except as listed in HPI above).  Objective:  PE:   BP 126/82   Pulse 82   Temp (!) 96.3 F (35.7 C) (Temporal)   Ht 5' 10"  (1.778 m)   Wt 202 lb (91.6 kg)   SpO2 98%   BMI 28.98 kg/m   Wt Readings from Last 3 Encounters:  09/03/19 198 lb (89.8 kg)  07/07/19 204 lb (92.5 kg)  06/01/19 203 lb (92.1 kg)    General: Appears his stated age, well developed, well nourished in NAD. Skin: Warm, dry and intact. No rashes or ulcerations noted. HEENT: Head: normal shape and size; Eyes: sclera white, no icterus, conjunctiva pink, PERRLA and EOMs intact;  Neck: Neck supple, trachea midline. No masses, lumps or thyromegaly present.  Cardiovascular: Normal rate and rhythm. S1,S2 noted.  No murmur, rubs or gallops noted. No JVD or BLE edema. No carotid bruits noted. Pulmonary/Chest: Normal effort and positive vesicular breath sounds. No respiratory distress. No wheezes, rales or ronchi noted.  Abdomen: Soft and nontender. Normal bowel sounds.  Ventral hernia noted. Liver, spleen and kidneys non palpable. Musculoskeletal: Strength 5/5 BUE. Strength 5/5 RLE, 4/5 LLE. No difficulty with gait. Neurological: Alert and oriented. Cranial nerves II-XII grossly intact. Coordination normal.  Psychiatric: Mood and affect normal. Behavior is normal. Judgment and thought content normal.    BMET    Component Value Date/Time   NA 139 07/07/2019 1518   K 4.2 07/07/2019 1518   CL 105 07/07/2019 1518   CO2  25 07/07/2019 1518   GLUCOSE 65 (L) 07/07/2019 1518   BUN 11 07/07/2019 1518   CREATININE 0.97 07/07/2019 1518   CALCIUM 9.0 07/07/2019 1518   GFRNONAA >60 12/19/2016 0406   GFRAA >60 12/19/2016 0406    Lipid Panel     Component Value Date/Time   CHOL 111 07/07/2019 1518   TRIG 48.0 07/07/2019 1518   HDL 53.50 07/07/2019 1518   CHOLHDL 2 07/07/2019 1518   VLDL 9.6 07/07/2019 1518   LDLCALC 48 07/07/2019 1518    CBC    Component Value Date/Time   WBC 2.7 (L) 07/07/2019 1518   RBC 4.95 07/07/2019 1518   HGB 15.3 07/07/2019 1518   HCT 46.0 07/07/2019 1518   PLT 164.0 07/07/2019 1518   MCV 92.8 07/07/2019 1518   MCH 30.7 06/20/2017 1605   MCHC 33.4 07/07/2019 1518   RDW 13.5 07/07/2019  1518   LYMPHSABS 1.1 06/20/2017 1605   MONOABS 0.2 06/20/2017 1605   EOSABS 0.1 06/20/2017 1605   BASOSABS 0.0 06/20/2017 1605    Hgb A1C Lab Results  Component Value Date   HGBA1C 5.4 11/26/2016      Assessment and Plan:   Medicare Annual Wellness Visit:  Diet: He does eat lean meat.  He consumes fruits and vegetables.  He tries to avoid fried foods.  He drinks mostly water. Physical activity: Walking Depression/mood screen: Negative, PHQ 9 score of 1 Hearing: Intact to whispered voice Visual acuity: Grossly normal, performs annual eye exam  ADLs: Capable Fall risk: None Home safety: Good Cognitive evaluation: Intact to orientation, naming, recall and repetition EOL planning: No adv directives, full code/ I agree  Preventative Medicine: Flu, tetanus, Pneumovax, Prevnar, Zostavax, Shingrix and Covid UTD.  Colon screening UTD.  Encouraged him to consume a balanced diet and exercise regimen.  Advised him to see an eye doctor and dentist annually.  Will check CBC, c-Met, lipid, TSH and PSA today.  Due dates for screening exam given the patient as part of his AVS.  Cold Intolerance:  Will check TSH  Next appointment: 1 year, Medicare wellness exam   Webb Silversmith, NP This  visit occurred during the SARS-CoV-2 public health emergency.  Safety protocols were in place, including screening questions prior to the visit, additional usage of staff PPE, and extensive cleaning of exam room while observing appropriate contact time as indicated for disinfecting solutions.

## 2020-08-01 NOTE — Patient Instructions (Signed)
Health Maintenance After Age 73 After age 73, you are at a higher risk for certain long-term diseases and infections as well as injuries from falls. Falls are a major cause of broken bones and head injuries in people who are older than age 73. Getting regular preventive care can help to keep you healthy and well. Preventive care includes getting regular testing and making lifestyle changes as recommended by your health care provider. Talk with your health care provider about:  Which screenings and tests you should have. A screening is a test that checks for a disease when you have no symptoms.  A diet and exercise plan that is right for you. What should I know about screenings and tests to prevent falls? Screening and testing are the best ways to find a health problem early. Early diagnosis and treatment give you the best chance of managing medical conditions that are common after age 73. Certain conditions and lifestyle choices may make you more likely to have a fall. Your health care provider may recommend:  Regular vision checks. Poor vision and conditions such as cataracts can make you more likely to have a fall. If you wear glasses, make sure to get your prescription updated if your vision changes.  Medicine review. Work with your health care provider to regularly review all of the medicines you are taking, including over-the-counter medicines. Ask your health care provider about any side effects that may make you more likely to have a fall. Tell your health care provider if any medicines that you take make you feel dizzy or sleepy.  Osteoporosis screening. Osteoporosis is a condition that causes the bones to get weaker. This can make the bones weak and cause them to break more easily.  Blood pressure screening. Blood pressure changes and medicines to control blood pressure can make you feel dizzy.  Strength and balance checks. Your health care provider may recommend certain tests to check your  strength and balance while standing, walking, or changing positions.  Foot health exam. Foot pain and numbness, as well as not wearing proper footwear, can make you more likely to have a fall.  Depression screening. You may be more likely to have a fall if you have a fear of falling, feel emotionally low, or feel unable to do activities that you used to do.  Alcohol use screening. Using too much alcohol can affect your balance and may make you more likely to have a fall. What actions can I take to lower my risk of falls? General instructions  Talk with your health care provider about your risks for falling. Tell your health care provider if: ? You fall. Be sure to tell your health care provider about all falls, even ones that seem minor. ? You feel dizzy, sleepy, or off-balance.  Take over-the-counter and prescription medicines only as told by your health care provider. These include any supplements.  Eat a healthy diet and maintain a healthy weight. A healthy diet includes low-fat dairy products, low-fat (lean) meats, and fiber from whole grains, beans, and lots of fruits and vegetables. Home safety  Remove any tripping hazards, such as rugs, cords, and clutter.  Install safety equipment such as grab bars in bathrooms and safety rails on stairs.  Keep rooms and walkways well-lit. Activity  Follow a regular exercise program to stay fit. This will help you maintain your balance. Ask your health care provider what types of exercise are appropriate for you.  If you need a cane or walker,   use it as recommended by your health care provider.  Wear supportive shoes that have nonskid soles.   Lifestyle  Do not drink alcohol if your health care provider tells you not to drink.  If you drink alcohol, limit how much you have: ? 0-1 drink a day for women. ? 0-2 drinks a day for men.  Be aware of how much alcohol is in your drink. In the [place] one drink equals one typical bottle of beer (12  oz), one-half glass of wine (5 oz), or one shot of hard liquor (1 oz).  Do not use any products that contain nicotine or tobacco, such as cigarettes and e-cigarettes. If you need help quitting, ask your health care provider. Summary  Having a healthy lifestyle and getting preventive care can help to protect your health and wellness after age 73.  Screening and testing are the best way to find a health problem early and help you avoid having a fall. Early diagnosis and treatment give you the best chance for managing medical conditions that are more common for people who are older than age 73.  Falls are a major cause of broken bones and head injuries in people who are older than age 73. Take precautions to prevent a fall at home.  Work with your health care provider to learn what changes you can make to improve your health and wellness and to prevent falls. This information is not intended to replace advice given to you by your health care provider. Make sure you discuss any questions you have with your health care provider. Document Revised: 08/13/2018 Document Reviewed: 03/05/2017 Elsevier Patient Education  2021 Elsevier Inc.  

## 2020-08-01 NOTE — Assessment & Plan Note (Signed)
Stable Not using an assistive device

## 2020-08-01 NOTE — Assessment & Plan Note (Signed)
D/C Cialis Will trial Sildenafil, RX sent to pharmacy

## 2020-08-01 NOTE — Assessment & Plan Note (Signed)
Continue Amlodipine, Atorvastatin and ASA CMET, Lipid profile today Encouraged him to consume a low salt, low fat diet

## 2020-08-01 NOTE — Assessment & Plan Note (Signed)
Controlled on Amlodipine Reinforced DASH diet  CMET today 

## 2020-08-03 ENCOUNTER — Encounter: Payer: Self-pay | Admitting: Internal Medicine

## 2020-08-17 DIAGNOSIS — Z23 Encounter for immunization: Secondary | ICD-10-CM | POA: Diagnosis not present

## 2020-09-13 DIAGNOSIS — U071 COVID-19: Secondary | ICD-10-CM | POA: Diagnosis not present

## 2020-09-14 ENCOUNTER — Other Ambulatory Visit: Payer: Self-pay | Admitting: Internal Medicine

## 2020-09-14 DIAGNOSIS — I639 Cerebral infarction, unspecified: Secondary | ICD-10-CM

## 2020-09-15 NOTE — Telephone Encounter (Addendum)
Late entry: On 09/14/20 at 1808 and 1810, patient called, left VM to return the call to [facility] to schedule OV with Rene Kocher, if he chooses to follow her to [facility], or call [facility] to schedule with a new provider.

## 2020-09-26 ENCOUNTER — Other Ambulatory Visit: Payer: Self-pay | Admitting: Internal Medicine

## 2020-09-26 DIAGNOSIS — I10 Essential (primary) hypertension: Secondary | ICD-10-CM

## 2020-11-05 ENCOUNTER — Other Ambulatory Visit: Payer: Self-pay | Admitting: Internal Medicine

## 2020-11-05 DIAGNOSIS — I10 Essential (primary) hypertension: Secondary | ICD-10-CM

## 2020-11-08 ENCOUNTER — Other Ambulatory Visit: Payer: Self-pay | Admitting: Internal Medicine

## 2020-11-08 DIAGNOSIS — I10 Essential (primary) hypertension: Secondary | ICD-10-CM

## 2020-11-08 MED ORDER — AMLODIPINE BESYLATE 10 MG PO TABS: 10.0000 mg | ORAL_TABLET | Freq: Every day | ORAL | 0 refills | Status: DC

## 2020-11-08 NOTE — Telephone Encounter (Signed)
Copied from CRM [id number]. Topic: Quick Communication - Rx Refill/Question >> Nov 08, 2020 12:31 PM Gaetana Michaelis A wrote: Medication: amLODipine (NORVASC) 10 MG tablet   Has the patient contacted their pharmacy? Yes.   (Agent: If no, request that the patient contact the pharmacy for the refill.) (Agent: If yes, when and what did the pharmacy advise?)  Preferred Pharmacy (with phone number or street name): [id number] [address]  Phone:  [phone] Fax:  [phone]  Agent: Please be advised that RX refills may take up to 3 business days. We ask that you follow-up with your pharmacy.

## 2020-11-08 NOTE — Telephone Encounter (Signed)
LAST APPOINTMENT DATE: 08/01/2020   NEXT APPOINTMENT DATE: Visit date not found    LAST REFILL: 07/11/2020  QTY: #90

## 2020-12-08 ENCOUNTER — Other Ambulatory Visit: Payer: Self-pay | Admitting: Internal Medicine

## 2020-12-08 DIAGNOSIS — I639 Cerebral infarction, unspecified: Secondary | ICD-10-CM

## 2020-12-08 NOTE — Telephone Encounter (Signed)
Last appt: 08/01/20 at [facility] location

## 2020-12-11 ENCOUNTER — Encounter: Payer: Self-pay | Admitting: Internal Medicine

## 2020-12-12 ENCOUNTER — Telehealth (HOSPITAL_COMMUNITY): Payer: Self-pay

## 2020-12-12 NOTE — Telephone Encounter (Signed)
Called to schedule us carotid, no answer, left vm. AW  

## 2020-12-13 ENCOUNTER — Other Ambulatory Visit (HOSPITAL_COMMUNITY): Payer: Self-pay | Admitting: Interventional Radiology

## 2020-12-13 DIAGNOSIS — I771 Stricture of artery: Secondary | ICD-10-CM

## 2020-12-21 ENCOUNTER — Ambulatory Visit (HOSPITAL_COMMUNITY)
Admission: RE | Admit: 2020-12-21 | Discharge: 2020-12-21 | Disposition: A | Discharge status: Home / Self Care | Payer: Medicare Other | Source: Ambulatory Visit | Attending: Interventional Radiology | Admitting: Interventional Radiology

## 2020-12-21 ENCOUNTER — Other Ambulatory Visit: Payer: Self-pay

## 2020-12-21 DIAGNOSIS — I771 Stricture of artery: Secondary | ICD-10-CM | POA: Diagnosis not present

## 2020-12-21 NOTE — Progress Notes (Signed)
Carotid duplex has been completed.   Preliminary results in CV Proc.   Blanch Media 12/21/2020 10:19 AM

## 2020-12-26 ENCOUNTER — Telehealth (HOSPITAL_COMMUNITY): Payer: Self-pay

## 2020-12-26 NOTE — Telephone Encounter (Signed)
Pt agreed to f/u in 1 year with bilateral US carotid. AW

## 2021-02-05 DIAGNOSIS — Z23 Encounter for immunization: Secondary | ICD-10-CM | POA: Diagnosis not present

## 2021-05-02 ENCOUNTER — Telehealth: Payer: Self-pay | Admitting: Internal Medicine

## 2021-05-02 DIAGNOSIS — I10 Essential (primary) hypertension: Secondary | ICD-10-CM

## 2021-05-02 MED ORDER — AMLODIPINE BESYLATE 10 MG PO TABS: 10.0000 mg | ORAL_TABLET | Freq: Every day | ORAL | 0 refills | Status: DC

## 2021-05-02 NOTE — Telephone Encounter (Signed)
Medication Refill - Medication: amLODipine (NORVASC) 10 MG tablet  Has the patient contacted their pharmacy? Yes.   Pt was advised to contact PCP  (Agent: If yes, when and what did the pharmacy advise?)  [phone] [address]  [address] [place] [address]  Phone: [phone] Fax: [phone]  Hours: Not open 24 hours   Preferred Pharmacy (with phone number or street name):  Has the patient been seen for an appointment in the last year OR does the patient have an upcoming appointment? Yes.    Agent: Please be advised that RX refills may take up to 3 business days. We ask that you follow-up with your pharmacy.

## 2021-06-01 ENCOUNTER — Other Ambulatory Visit: Payer: Self-pay | Admitting: Internal Medicine

## 2021-06-01 DIAGNOSIS — I639 Cerebral infarction, unspecified: Secondary | ICD-10-CM

## 2021-06-01 NOTE — Telephone Encounter (Signed)
Requested medications are due for refill today.  yes  Requested medications are on the active medications list.  yes  Last refill. 12/08/2020 390 1 refill  Future visit scheduled.   no  Notes to clinic.  Pharmacy needs Dx code.    Requested Prescriptions  Pending Prescriptions Disp Refills   atorvastatin (LIPITOR) 40 MG tablet [Pharmacy Med Name: ATORVASTATIN 40 MG TABLET] 90 tablet 1    Sig: TAKE 1 TABLET BY MOUTH DAILY AT 6 PM.     Cardiovascular:  Antilipid - Statins Failed - 06/01/2021  6:39 PM      Failed - Valid encounter within last 12 months    Recent Outpatient Visits   None            Passed - Total Cholesterol in normal range and within 360 days    Cholesterol  Date Value Ref Range Status  08/01/2020 120 0 - 200 mg/dL Final    Comment:    ATP III Classification       Desirable:  < 200 mg/dL               Borderline High:  200 - 239 mg/dL          High:  > = 989 mg/dL          Passed - LDL in normal range and within 360 days    LDL Cholesterol  Date Value Ref Range Status  08/01/2020 48 0 - 99 mg/dL Final          Passed - HDL in normal range and within 360 days    HDL  Date Value Ref Range Status  08/01/2020 54.90 >39.00 mg/dL Final          Passed - Triglycerides in normal range and within 360 days    Triglycerides  Date Value Ref Range Status  08/01/2020 86.0 0.0 - 149.0 mg/dL Final    Comment:    Normal:  <150 mg/dLBorderline High:  150 - 199 mg/dL          Passed - Patient is not pregnant

## 2021-07-13 ENCOUNTER — Other Ambulatory Visit: Payer: Self-pay | Admitting: Internal Medicine

## 2021-07-13 DIAGNOSIS — I10 Essential (primary) hypertension: Secondary | ICD-10-CM

## 2021-07-13 NOTE — Telephone Encounter (Signed)
Requested medication (s) are due for refill today: yes ? ?Requested medication (s) are on the active medication list: yes ? ?Last refill:  05/02/22 #90 with 0 RF ? ?Future visit scheduled: 09/12/21 ? ?Notes to clinic:  failed protocol of visit within 6 months, OV stated return in a year, has appt scheduled but not until 09/12/21, please assess. ? ? ?  ? ?Requested Prescriptions  ?Pending Prescriptions Disp Refills  ? amLODipine (NORVASC) 10 MG tablet [Pharmacy Med Name: AMLODIPINE BESYLATE 10 MG TAB] 90 tablet 0  ?  Sig: TAKE 1 TABLET BY MOUTH EVERY DAY  ?  ? Cardiovascular: Calcium Channel Blockers 2 Failed - 07/13/2021  2:26 PM  ?  ?  Failed - Valid encounter within last 6 months  ?  Recent Outpatient Visits   ?None ?  ?  ?Future Appointments   ? ?        ? In 2 months Baity, Salvadore Oxford, NP [facility]  ? ?  ? ?  ?  ?  Passed - Last BP in normal range  ?  BP Readings from Last 1 Encounters:  ?08/01/20 126/82  ?  ?  ?  ?  Passed - Last Heart Rate in normal range  ?  Pulse Readings from Last 1 Encounters:  ?08/01/20 82  ?  ?  ?  ?  ? ? ?

## 2021-08-29 ENCOUNTER — Other Ambulatory Visit: Payer: Self-pay | Admitting: Internal Medicine

## 2021-08-29 DIAGNOSIS — I639 Cerebral infarction, unspecified: Secondary | ICD-10-CM

## 2021-08-30 NOTE — Telephone Encounter (Signed)
Requested medication (s) are due for refill today:   Yes ? ?Requested medication (s) are on the active medication list:   Yes ? ?Future visit scheduled:   Yes in 1 wk. ? ? ?Last ordered: 06/04/2021 #90, 0 refills ? ?Returned because labs overdue and no valid encounter within 12 months per protocol.  ? ?Requested Prescriptions  ?Pending Prescriptions Disp Refills  ? atorvastatin (LIPITOR) 40 MG tablet [Pharmacy Med Name: ATORVASTATIN 40 MG TABLET] 90 tablet 0  ?  Sig: TAKE 1 TABLET BY MOUTH DAILY AT 6 PM.  ?  ? Cardiovascular:  Antilipid - Statins Failed - 08/29/2021  2:35 AM  ?  ?  Failed - Valid encounter within last 12 months  ?  Recent Outpatient Visits   ?None ?  ?  ?Future Appointments   ? ?        ? In 1 week Baity, Salvadore Oxford, NP [facility]  ? ?  ? ? ?  ?  ?  Failed - Lipid Panel in normal range within the last 12 months  ?  Cholesterol  ?Date Value Ref Range Status  ?08/01/2020 120 0 - 200 mg/dL Final  ?  Comment:  ?  ATP III Classification       Desirable:  < 200 mg/dL               Borderline High:  200 - 239 mg/dL          High:  > = 786 mg/dL  ? ?LDL Cholesterol  ?Date Value Ref Range Status  ?08/01/2020 48 0 - 99 mg/dL Final  ? ?HDL  ?Date Value Ref Range Status  ?08/01/2020 54.90 >39.00 mg/dL Final  ? ?Triglycerides  ?Date Value Ref Range Status  ?08/01/2020 86.0 0.0 - 149.0 mg/dL Final  ?  Comment:  ?  Normal:  <150 mg/dLBorderline High:  150 - 199 mg/dL  ? ?  ?  ?  Passed - Patient is not pregnant  ?  ?  ? ?

## 2021-09-12 ENCOUNTER — Ambulatory Visit (INDEPENDENT_AMBULATORY_CARE_PROVIDER_SITE_OTHER): Payer: Medicare Other | Admitting: Internal Medicine

## 2021-09-12 ENCOUNTER — Encounter: Payer: Self-pay | Admitting: Internal Medicine

## 2021-09-12 VITALS — BP 138/80 | HR 66 | Temp 97.5°F | Ht 70.0 in | Wt 206.0 lb

## 2021-09-12 DIAGNOSIS — E663 Overweight: Secondary | ICD-10-CM

## 2021-09-12 DIAGNOSIS — I10 Essential (primary) hypertension: Secondary | ICD-10-CM

## 2021-09-12 DIAGNOSIS — I639 Cerebral infarction, unspecified: Secondary | ICD-10-CM | POA: Diagnosis not present

## 2021-09-12 DIAGNOSIS — E7800 Pure hypercholesterolemia, unspecified: Secondary | ICD-10-CM

## 2021-09-12 DIAGNOSIS — Z6829 Body mass index (BMI) 29.0-29.9, adult: Secondary | ICD-10-CM

## 2021-09-12 DIAGNOSIS — D700 Congenital agranulocytosis: Secondary | ICD-10-CM

## 2021-09-12 DIAGNOSIS — D696 Thrombocytopenia, unspecified: Secondary | ICD-10-CM | POA: Diagnosis not present

## 2021-09-12 DIAGNOSIS — E6609 Other obesity due to excess calories: Secondary | ICD-10-CM | POA: Insufficient documentation

## 2021-09-12 DIAGNOSIS — R739 Hyperglycemia, unspecified: Secondary | ICD-10-CM

## 2021-09-12 DIAGNOSIS — Z8673 Personal history of transient ischemic attack (TIA), and cerebral infarction without residual deficits: Secondary | ICD-10-CM

## 2021-09-12 DIAGNOSIS — N528 Other male erectile dysfunction: Secondary | ICD-10-CM

## 2021-09-12 DIAGNOSIS — R7309 Other abnormal glucose: Secondary | ICD-10-CM

## 2021-09-12 DIAGNOSIS — G8194 Hemiplegia, unspecified affecting left nondominant side: Secondary | ICD-10-CM

## 2021-09-12 NOTE — Assessment & Plan Note (Signed)
Encourage diet and exercise for weight loss 

## 2021-09-12 NOTE — Assessment & Plan Note (Signed)
CBC today ?He is no longer following with hematology ?

## 2021-09-12 NOTE — Patient Instructions (Signed)
Heart-Healthy Eating Plan Heart-healthy meal planning includes: Eating less unhealthy fats. Eating more healthy fats. Making other changes in your diet. Talk with your doctor or a diet specialist (dietitian) to create an eating plan that is right for you. What is my plan? Your doctor may recommend an eating plan that includes: Total fat: ______% or less of total calories a day. Saturated fat: ______% or less of total calories a day. Cholesterol: less than _________mg a day. What are tips for following this plan? Cooking Avoid frying your food. Try to bake, boil, grill, or broil it instead. You can also reduce fat by: Removing the skin from poultry. Removing all visible fats from meats. Steaming vegetables in water or broth. Meal planning  At meals, divide your plate into four equal parts: Fill one-half of your plate with vegetables and green salads. Fill one-fourth of your plate with whole grains. Fill one-fourth of your plate with lean protein foods. Eat 4-5 servings of vegetables per day. A serving of vegetables is: 1 cup of raw or cooked vegetables. 2 cups of raw leafy greens. Eat 4-5 servings of fruit per day. A serving of fruit is: 1 medium whole fruit.  cup of dried fruit.  cup of fresh, frozen, or canned fruit.  cup of 100% fruit juice. Eat more foods that have soluble fiber. These are apples, broccoli, carrots, beans, peas, and barley. Try to get 20-30 g of fiber per day. Eat 4-5 servings of nuts, legumes, and seeds per week: 1 serving of dried beans or legumes equals  cup after being cooked. 1 serving of nuts is  cup. 1 serving of seeds equals 1 tablespoon. General information Eat more home-cooked food. Eat less restaurant, buffet, and fast food. Limit or avoid alcohol. Limit foods that are high in starch and sugar. Avoid fried foods. Lose weight if you are overweight. Keep track of how much salt (sodium) you eat. This is important if you have high blood  pressure. Ask your doctor to tell you more about this. Try to add vegetarian meals each week. Fats Choose healthy fats. These include olive oil and canola oil, flaxseeds, walnuts, almonds, and seeds. Eat more omega-3 fats. These include salmon, mackerel, sardines, tuna, flaxseed oil, and ground flaxseeds. Try to eat fish at least 2 times each week. Check food labels. Avoid foods with trans fats or high amounts of saturated fat. Limit saturated fats. These are often found in animal products, such as meats, butter, and cream. These are also found in plant foods, such as palm oil, palm kernel oil, and coconut oil. Avoid foods with partially hydrogenated oils in them. These have trans fats. Examples are stick margarine, some tub margarines, cookies, crackers, and other baked goods. What foods can I eat? Fruits All fresh, canned (in natural juice), or frozen fruits. Vegetables Fresh or frozen vegetables (raw, steamed, roasted, or grilled). Green salads. Grains Most grains. Choose whole wheat and whole grains most of the time. Rice and pasta, including brown rice and pastas made with whole wheat. Meats and other proteins Lean, well-trimmed beef, veal, pork, and lamb. Chicken and turkey without skin. All fish and shellfish. Wild duck, rabbit, pheasant, and venison. Egg whites or low-cholesterol egg substitutes. Dried beans, peas, lentils, and tofu. Seeds and most nuts. Dairy Low-fat or nonfat cheeses, including ricotta and mozzarella. Skim or 1% milk that is liquid, powdered, or evaporated. Buttermilk that is made with low-fat milk. Nonfat or low-fat yogurt. Fats and oils Non-hydrogenated (trans-free) margarines. Vegetable oils, including   soybean, sesame, sunflower, olive, peanut, safflower, corn, canola, and cottonseed. Salad dressings or mayonnaise made with a vegetable oil. Beverages Mineral water. Coffee and tea. Diet carbonated beverages. Sweets and desserts Sherbet, gelatin, and fruit ice.  Small amounts of dark chocolate. Limit all sweets and desserts. Seasonings and condiments All seasonings and condiments. The items listed above may not be a complete list of foods and drinks you can eat. Contact a dietitian for more options. What foods should I avoid? Fruits Canned fruit in heavy syrup. Fruit in cream or butter sauce. Fried fruit. Limit coconut. Vegetables Vegetables cooked in cheese, cream, or butter sauce. Fried vegetables. Grains Breads that are made with saturated or trans fats, oils, or whole milk. Croissants. Sweet rolls. Donuts. High-fat crackers, such as cheese crackers. Meats and other proteins Fatty meats, such as hot dogs, ribs, sausage, bacon, rib-eye roast or steak. High-fat deli meats, such as salami and bologna. Caviar. Domestic duck and goose. Organ meats, such as liver. Dairy Cream, sour cream, cream cheese, and creamed cottage cheese. Whole-milk cheeses. Whole or 2% milk that is liquid, evaporated, or condensed. Whole buttermilk. Cream sauce or high-fat cheese sauce. Yogurt that is made from whole milk. Fats and oils Meat fat, or shortening. Cocoa butter, hydrogenated oils, palm oil, coconut oil, palm kernel oil. Solid fats and shortenings, including bacon fat, salt pork, lard, and butter. Nondairy cream substitutes. Salad dressings with cheese or sour cream. Beverages Regular sodas and juice drinks with added sugar. Sweets and desserts Frosting. Pudding. Cookies. Cakes. Pies. Milk chocolate or white chocolate. Buttered syrups. Full-fat ice cream or ice cream drinks. The items listed above may not be a complete list of foods and drinks to avoid. Contact a dietitian for more information. Summary Heart-healthy meal planning includes eating less unhealthy fats, eating more healthy fats, and making other changes in your diet. Eat a balanced diet. This includes fruits and vegetables, low-fat or nonfat dairy, lean protein, nuts and legumes, whole grains, and  heart-healthy oils and fats. This information is not intended to replace advice given to you by your health care provider. Make sure you discuss any questions you have with your health care provider. Document Revised: 08/31/2020 Document Reviewed: 08/31/2020 Elsevier Patient Education  2022 Elsevier Inc.  

## 2021-09-12 NOTE — Assessment & Plan Note (Signed)
Continue Sildenafil as needed 

## 2021-09-12 NOTE — Assessment & Plan Note (Signed)
Controlled on Amlodipine ?Reinforced DASH diet and exercise weight loss ?C-Met today ?

## 2021-09-12 NOTE — Assessment & Plan Note (Signed)
Not causing any functional issues ?We will monitor ?

## 2021-09-12 NOTE — Assessment & Plan Note (Signed)
CBC today ?He is no longer following with hematology ?

## 2021-09-12 NOTE — Assessment & Plan Note (Signed)
C-Met and lipid profile today ?Encouraged him to consume a low-fat diet ?Continue Atorvastatin ?

## 2021-09-12 NOTE — Progress Notes (Signed)
? ?Subjective:  ? ? Patient ID: Jason Nolan, male    DOB: 15-Dec-1945, 76 y.o.   MRN: [id number] ? ?HPI ? ?Patient presents to clinic today for follow-up of chronic conditions. ? ?HTN: His BP today is 138/80.  He is taking Amlodipine as prescribed.  ECG from 11/2016 reviewed. ? ?HLD with History of Stroke: With residual left-sided weakness.  His last LDL was 48, triglycerides 86, 07/2020.  He is taking Atorvastatin and Aspirin as prescribed.  He does not follow with neurology. ? ?ED: Managed with Sildenafil.  He does not follow with urology. ? 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 He has seen hematology in the past. ? ?Thrombocytopenia: His last platelet count was 147, 07/2020. He is taking ASA. He has seen hematology in the past. ? ?Review of Systems ? ?   ?Past Medical History:  ?Diagnosis Date  ? Glaucoma   ? Hepatitis C   ? Stroke [facility])   ? ? ?Current Outpatient Medications  ?Medication Sig Dispense Refill  ? amLODipine (NORVASC) 10 MG tablet TAKE 1 TABLET BY MOUTH EVERY DAY 90 tablet 0  ? amLODipine (NORVASC) 10 MG tablet TAKE 1 TABLET BY MOUTH EVERY DAY 90 tablet 0  ? aspirin 81 MG chewable tablet Chew 1 tablet (81 mg total) by mouth daily.    ? atorvastatin (LIPITOR) 40 MG tablet TAKE 1 TABLET BY MOUTH DAILY AT 6 PM. 30 tablet 0  ? dorzolamide-timolol (COSOPT) 22.3-6.8 MG/ML ophthalmic solution 1 drop 2 (two) times daily.    ? RHOPRESSA 0.02 % SOLN Apply to eye at bedtime.    ? sildenafil (VIAGRA) 50 MG tablet Take 1 tablet (50 mg total) by mouth daily as needed for erectile dysfunction. 10 tablet 5  ? VYZULTA 0.024 % SOLN Apply 1 drop to eye at bedtime.     ? ?No current facility-administered medications for this visit.  ? ? ?No Known Allergies ? ?Family History  ?Problem Relation Age of Onset  ? Hypertension Mother   ? Hypertension Father   ? ? ?Social History  ? ?Socioeconomic History  ? Marital status: Married  ?  Spouse name: Not on file  ? Number of children: Not on file  ? Years of education: Not  on file  ? Highest education level: Not on file  ?Occupational History  ? Not on file  ?Tobacco Use  ? Smoking status: Former  ?  Packs/day: 0.50  ?  Years: 15.00  ?  Pack years: 7.50  ?  Types: Cigarettes  ?  Quit date: 90  ?  Years since quitting: 33.3  ? Smokeless tobacco: Never  ?Vaping Use  ? Vaping Use: Never used  ?Substance and Sexual Activity  ? Alcohol use: No  ?  Alcohol/week: 0.0 standard drinks  ? Drug use: No  ? Sexual activity: Yes  ?  Partners: Female  ?  Birth control/protection: None  ?Other Topics Concern  ? Not on file  ?Social History Narrative  ? Not on file  ? ?Social Determinants of Health  ? ?Financial Resource Strain: Not on file  ?Food Insecurity: Not on file  ?Transportation Needs: Not on file  ?Physical Activity: Not on file  ?Stress: Not on file  ?Social Connections: Not on file  ?Intimate Partner Violence: Not on file  ? ? ? ?Constitutional: Denies fever, malaise, fatigue, headache or abrupt weight changes.  ?HEENT: Denies eye pain, eye redness, ear pain, ringing in the ears, wax buildup, runny nose, nasal congestion, bloody  nose, or sore throat. ?Respiratory: Denies difficulty breathing, shortness of breath, cough or sputum production.   ?Cardiovascular: Denies chest pain, chest tightness, palpitations or swelling in the hands or feet.  ?Gastrointestinal: Denies abdominal pain, bloating, constipation, diarrhea or blood in the stool.  ?GU: Denies urgency, frequency, pain with urination, burning sensation, blood in urine, odor or discharge. ?Musculoskeletal: Pt reports left sided weakness, left low back pain. Denies decrease in range of motion, difficulty with gait, or joint pain and swelling.  ?Skin: Pt reports fungal toenails. Denies redness, rashes, lesions or ulcercations.  ?Neurological: Denies dizziness, difficulty with memory, difficulty with speech or problems with balance and coordination.  ?Psych: Denies anxiety, depression, SI/HI. ? ?No other specific complaints in a  complete review of systems (except as listed in HPI above). ? ?Objective:  ? Physical Exam ? ?BP 138/80 (BP Location: Right Arm, Patient Position: Sitting, Cuff Size: Large)   Pulse 66   Temp (!) 97.5 ?F (36.4 ?C) (Temporal)   Ht 5\' 10"  (1.778 m)   Wt 206 lb (93.4 kg)   SpO2 98%   BMI 29.56 kg/m?  ? ?Wt Readings from Last 3 Encounters:  ?08/01/20 202 lb (91.6 kg)  ?09/03/19 198 lb (89.8 kg)  ?07/07/19 204 lb (92.5 kg)  ? ? ?General: Appears his stated age, overweight, in NAD. ?Skin: Warm, dry and intact. Fungal nails noted. ?HEENT: Head: normal shape and size; Eyes: sclera white, no icterus, conjunctiva pink, PERRLA and EOMs intact;  ?Neck:  Neck supple, trachea midline. No masses, lumps or thyromegaly present.  ?Cardiovascular: Normal rate and rhythm. S1,S2 noted.  No murmur, rubs or gallops noted. No BLE edema. No carotid bruits noted. ?Pulmonary/Chest: Normal effort and positive vesicular breath sounds. No respiratory distress. No wheezes, rales or ronchi noted.  ?Abdomen: Soft and nontender. Normal bowel sounds.  ?Musculoskeletal: Strength 5/5 RUE/RLE. Strength 4/5 LUE/LLE. No difficulty with gait.  ?Neurological: Alert and oriented. Cranial nerves II-XII grossly intact. Coordination normal.  ?Psychiatric: Mood and affect normal. Behavior is normal. Judgment and thought content normal.  ? ? ?BMET ?   ?Component Value Date/Time  ? NA 139 08/01/2020 1229  ? K 4.1 08/01/2020 1229  ? CL 105 08/01/2020 1229  ? CO2 25 08/01/2020 1229  ? GLUCOSE 84 08/01/2020 1229  ? BUN 13 08/01/2020 1229  ? CREATININE 0.88 08/01/2020 1229  ? CALCIUM 8.9 08/01/2020 1229  ? GFRNONAA >60 12/19/2016 0406  ? GFRAA >60 12/19/2016 0406  ? ? ?Lipid Panel  ?   ?Component Value Date/Time  ? CHOL 120 08/01/2020 1229  ? TRIG 86.0 08/01/2020 1229  ? HDL 54.90 08/01/2020 1229  ? CHOLHDL 2 08/01/2020 1229  ? VLDL 17.2 08/01/2020 1229  ? LDLCALC 48 08/01/2020 1229  ? ? ?CBC ?   ?Component Value Date/Time  ? WBC 2.3 Repeated and verified X2.  (L) 08/01/2020 1229  ? RBC 4.85 08/01/2020 1229  ? HGB 15.1 08/01/2020 1229  ? HCT 44.9 08/01/2020 1229  ? PLT 147.0 (L) 08/01/2020 1229  ? MCV 92.6 08/01/2020 1229  ? MCH 30.7 06/20/2017 1605  ? MCHC 33.6 08/01/2020 1229  ? RDW 13.5 08/01/2020 1229  ? LYMPHSABS 1.1 06/20/2017 1605  ? MONOABS 0.2 06/20/2017 1605  ? EOSABS 0.1 06/20/2017 1605  ? BASOSABS 0.0 06/20/2017 1605  ? ? ?Hgb A1C ?Lab Results  ?Component Value Date  ? HGBA1C 5.4 11/26/2016  ? ? ? ? ? ? ? ?   ?Assessment & Plan:  ? ? ?11/28/2016, NP ? ? ?

## 2021-09-12 NOTE — Assessment & Plan Note (Signed)
Continue Amlodipine, Atorvastatin and Aspirin ?C-Met and lipid profile today ?

## 2021-09-13 LAB — COMPLETE METABOLIC PANEL WITH GFR
AG Ratio: 2 (calc) (ref 1.0–2.5)
ALT: 20 U/L (ref 9–46)
AST: 20 U/L (ref 10–35)
Albumin: 4.4 g/dL (ref 3.6–5.1)
Alkaline phosphatase (APISO): 66 U/L (ref 35–144)
BUN: 15 mg/dL (ref 7–25)
CO2: 24 mmol/L (ref 20–32)
Calcium: 8.8 mg/dL (ref 8.6–10.3)
Chloride: 105 mmol/L (ref 98–110)
Creat: 0.98 mg/dL (ref 0.70–1.28)
Globulin: 2.2 g/dL (calc) (ref 1.9–3.7)
Glucose, Bld: 96 mg/dL (ref 65–139)
Potassium: 3.9 mmol/L (ref 3.5–5.3)
Sodium: 138 mmol/L (ref 135–146)
Total Bilirubin: 0.8 mg/dL (ref 0.2–1.2)
Total Protein: 6.6 g/dL (ref 6.1–8.1)
eGFR: 81 mL/min/{1.73_m2} (ref >=60)

## 2021-09-13 LAB — CBC WITH DIFFERENTIAL/PLATELET
Absolute Monocytes: 265 cells/uL (ref 200–950)
Basophils Absolute: 22 cells/uL (ref 0–200)
Basophils Relative: 0.8 %
Eosinophils Absolute: 122 cells/uL (ref 15–500)
Eosinophils Relative: 4.5 %
HCT: 45.5 % (ref 38.5–50.0)
Hemoglobin: 14.9 g/dL (ref 13.2–17.1)
Lymphs Abs: 1158 cells/uL (ref 850–3900)
MCH: 30.7 pg (ref 27.0–33.0)
MCHC: 32.7 g/dL (ref 32.0–36.0)
MCV: 93.8 fL (ref 80.0–100.0)
MPV: 10.3 fL (ref 7.5–12.5)
Monocytes Relative: 9.8 %
Neutro Abs: 1134 cells/uL — ABNORMAL LOW (ref 1500–7800)
Neutrophils Relative %: 42 %
Platelets: 191 10*3/uL (ref 140–400)
RBC: 4.85 10*6/uL (ref 4.20–5.80)
RDW: 12.7 % (ref 11.0–15.0)
Total Lymphocyte: 42.9 %
WBC: 2.7 10*3/uL — ABNORMAL LOW (ref 3.8–10.8)

## 2021-09-13 LAB — LIPID PANEL
Cholesterol: 108 mg/dL (ref <200)
HDL: 60 mg/dL (ref >=40)
LDL Cholesterol (Calc): 35 mg/dL (calc)
Non-HDL Cholesterol (Calc): 48 mg/dL (calc) (ref <130)
Total CHOL/HDL Ratio: 1.8 (calc) (ref <5.0)
Triglycerides: 57 mg/dL (ref <150)

## 2021-09-13 LAB — HEMOGLOBIN A1C
Hgb A1c MFr Bld: 5.6 % of total Hgb (ref <5.7)
Mean Plasma Glucose: 114 mg/dL
eAG (mmol/L): 6.3 mmol/L

## 2021-09-27 ENCOUNTER — Other Ambulatory Visit: Payer: Self-pay | Admitting: Internal Medicine

## 2021-09-27 DIAGNOSIS — I639 Cerebral infarction, unspecified: Secondary | ICD-10-CM

## 2021-10-01 NOTE — Telephone Encounter (Signed)
Requested Prescriptions  Pending Prescriptions Disp Refills  . atorvastatin (LIPITOR) 40 MG tablet [Pharmacy Med Name: ATORVASTATIN 40 MG TABLET] 30 tablet 0    Sig: TAKE 1 TABLET BY MOUTH DAILY AT 6 PM.     Cardiovascular:  Antilipid - Statins Failed - 09/27/2021  1:33 PM      Failed - Lipid Panel in normal range within the last 12 months    Cholesterol  Date Value Ref Range Status  09/12/2021 108 <200 mg/dL Final   LDL Cholesterol (Calc)  Date Value Ref Range Status  09/12/2021 35 mg/dL (calc) Final    Comment:    Reference range: <100 . Desirable range <100 mg/dL for primary prevention;   <70 mg/dL for patients with CHD or diabetic patients  with > or = 2 CHD risk factors. Marland Kitchen LDL-C is now calculated using the Martin-Hopkins  calculation, which is a validated novel method providing  better accuracy than the Friedewald equation in the  estimation of LDL-C.  Horald Pollen et al. Lenox Ahr. 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

## 2021-10-12 ENCOUNTER — Other Ambulatory Visit: Payer: Self-pay | Admitting: Internal Medicine

## 2021-10-12 DIAGNOSIS — I10 Essential (primary) hypertension: Secondary | ICD-10-CM

## 2021-10-12 NOTE — Telephone Encounter (Signed)
Requested medication (s) are due for refill today - yes  Requested medication (s) are on the active medication list -yes  Future visit scheduled -no  Last refill: 11/08/20 #90  Notes to clinic: outside provider  Requested Prescriptions  Pending Prescriptions Disp Refills   amLODipine (NORVASC) 10 MG tablet [Pharmacy Med Name: AMLODIPINE BESYLATE 10 MG TAB] 90 tablet 0    Sig: TAKE 1 TABLET BY MOUTH EVERY DAY     Cardiovascular: Calcium Channel Blockers 2 Passed - 10/12/2021  2:13 AM      Passed - Last BP in normal range    BP Readings from Last 1 Encounters:  09/12/21 138/80         Passed - Last Heart Rate in normal range    Pulse Readings from Last 1 Encounters:  09/12/21 66         Passed - Valid encounter within last 6 months    Recent Outpatient Visits           1 month ago Congenital neutropenia (HCC)   [facility], Salvadore Oxford, NP       Future Appointments             In 5 months Baity, Salvadore Oxford, NP [facility], [facility]               Requested Prescriptions  Pending Prescriptions Disp Refills   amLODipine (NORVASC) 10 MG tablet [Pharmacy Med Name: AMLODIPINE BESYLATE 10 MG TAB] 90 tablet 0    Sig: TAKE 1 TABLET BY MOUTH EVERY DAY     Cardiovascular: Calcium Channel Blockers 2 Passed - 10/12/2021  2:13 AM      Passed - Last BP in normal range    BP Readings from Last 1 Encounters:  09/12/21 138/80         Passed - Last Heart Rate in normal range    Pulse Readings from Last 1 Encounters:  09/12/21 66         Passed - Valid encounter within last 6 months    Recent Outpatient Visits           1 month ago Congenital neutropenia (HCC)   [facility] [place], Salvadore Oxford, NP       Future Appointments             In 5 months Baity, Salvadore Oxford, NP [facility], [facility]

## 2021-10-27 ENCOUNTER — Other Ambulatory Visit: Payer: Self-pay | Admitting: Internal Medicine

## 2021-10-27 DIAGNOSIS — I639 Cerebral infarction, unspecified: Secondary | ICD-10-CM

## 2021-11-23 ENCOUNTER — Ambulatory Visit (INDEPENDENT_AMBULATORY_CARE_PROVIDER_SITE_OTHER): Payer: Medicare Other

## 2021-11-23 VITALS — Wt 206.0 lb

## 2021-11-23 DIAGNOSIS — Z0000 Encounter for general adult medical examination without abnormal findings: Secondary | ICD-10-CM | POA: Diagnosis not present

## 2021-11-23 NOTE — Patient Instructions (Signed)
[name] , Thank you for taking time to come for your Medicare Wellness Visit. I appreciate your ongoing commitment to your health goals. Please review the following plan we discussed and let me know if I can assist you in the future.   Screening recommendations/referrals: Colonoscopy: 07/01/2018 Recommended yearly ophthalmology/optometry visit for glaucoma screening and checkup Recommended yearly dental visit for hygiene and checkup  Vaccinations: Influenza vaccine: 02/05/21 Pneumococcal vaccine: 10/27/14 Tdap vaccine: 08/18/12 Shingles vaccine: Zostavax 02/18/14  Shingrix 09/11/16, 12/24/16   Covid-19: 08/05/19, 02/08/20, 03/07/20  Advanced directives: no  Conditions/risks identified: none  Next appointment: Follow up in one year for your annual wellness visit. 11/29/22 @ 10:45 am by phone  Preventive Care 65 Years and Older, Male Preventive care refers to lifestyle choices and visits with your health care provider that can promote health and wellness. What does preventive care include? A yearly physical exam. This is also called an annual well check. Dental exams once or twice a year. Routine eye exams. Ask your health care provider how often you should have your eyes checked. Personal lifestyle choices, including: Daily care of your teeth and gums. Regular physical activity. Eating a healthy diet. Avoiding tobacco and drug use. Limiting alcohol use. Practicing safe sex. Taking low doses of aspirin every day. Taking vitamin and mineral supplements as recommended by your health care provider. What happens during an annual well check? The services and screenings done by your health care provider during your annual well check will depend on your age, overall health, lifestyle risk factors, and family history of disease. Counseling  Your health care provider may ask you questions about your: Alcohol use. Tobacco use. Drug use. Emotional well-being. Home and relationship  well-being. Sexual activity. Eating habits. History of falls. Memory and ability to understand (cognition). Work and work Astronomer. Screening  You may have the following tests or measurements: Height, weight, and BMI. Blood pressure. Lipid and cholesterol levels. These may be checked every 5 years, or more frequently if you are over 32 years old. Skin check. Lung cancer screening. You may have this screening every year starting at age 35 if you have a 30-pack-year history of smoking and currently smoke or have quit within the past 15 years. Fecal occult blood test (FOBT) of the stool. You may have this test every year starting at age 41. Flexible sigmoidoscopy or colonoscopy. You may have a sigmoidoscopy every 5 years or a colonoscopy every 10 years starting at age 51. Prostate cancer screening. Recommendations will vary depending on your family history and other risks. Hepatitis C blood test. Hepatitis B blood test. Sexually transmitted disease (STD) testing. Diabetes screening. This is done by checking your blood sugar (glucose) after you have not eaten for a while (fasting). You may have this done every 1-3 years. Abdominal aortic aneurysm (AAA) screening. You may need this if you are a current or former smoker. Osteoporosis. You may be screened starting at age 67 if you are at high risk. Talk with your health care provider about your test results, treatment options, and if necessary, the need for more tests. Vaccines  Your health care provider may recommend certain vaccines, such as: Influenza vaccine. This is recommended every year. Tetanus, diphtheria, and acellular pertussis (Tdap, Td) vaccine. You may need a Td booster every 10 years. Zoster vaccine. You may need this after age 39. Pneumococcal 13-valent conjugate (PCV13) vaccine. One dose is recommended after age 2. Pneumococcal polysaccharide (PPSV23) vaccine. One dose is recommended after age 21.  Talk to your health care  provider about which screenings and vaccines you need and how often you need them. This information is not intended to replace advice given to you by your health care provider. Make sure you discuss any questions you have with your health care provider. Document Released: 05/19/2015 Document Revised: 01/10/2016 Document Reviewed: 02/21/2015 Elsevier Interactive Patient Education  2017 [place] Prevention in the Home Falls can cause injuries. They can happen to people of all ages. There are many things you can do to make your home safe and to help prevent falls. What can I do on the outside of my home? Regularly fix the edges of walkways and driveways and fix any cracks. Remove anything that might make you trip as you walk through a door, such as a raised step or threshold. Trim any bushes or trees on the path to your home. Use bright outdoor lighting. Clear any walking paths of anything that might make someone trip, such as rocks or tools. Regularly check to see if handrails are loose or broken. Make sure that both sides of any steps have handrails. Any raised decks and porches should have guardrails on the edges. Have any leaves, snow, or ice cleared regularly. Use sand or salt on walking paths during winter. Clean up any spills in your garage right away. This includes oil or grease spills. What can I do in the bathroom? Use night lights. Install grab bars by the toilet and in the tub and shower. Do not use towel bars as grab bars. Use non-skid mats or decals in the tub or shower. If you need to sit down in the shower, use a plastic, non-slip stool. Keep the floor dry. Clean up any water that spills on the floor as soon as it happens. Remove soap buildup in the tub or shower regularly. Attach bath mats securely with double-sided non-slip rug tape. Do not have throw rugs and other things on the floor that can make you trip. What can I do in the bedroom? Use night lights. Make  sure that you have a light by your bed that is easy to reach. Do not use any sheets or blankets that are too big for your bed. They should not hang down onto the floor. Have a firm chair that has side arms. You can use this for support while you get dressed. Do not have throw rugs and other things on the floor that can make you trip. What can I do in the kitchen? Clean up any spills right away. Avoid walking on wet floors. Keep items that you use a lot in easy-to-reach places. If you need to reach something above you, use a strong step stool that has a grab bar. Keep electrical cords out of the way. Do not use floor polish or wax that makes floors slippery. If you must use wax, use non-skid floor wax. Do not have throw rugs and other things on the floor that can make you trip. What can I do with my stairs? Do not leave any items on the stairs. Make sure that there are handrails on both sides of the stairs and use them. Fix handrails that are broken or loose. Make sure that handrails are as long as the stairways. Check any carpeting to make sure that it is firmly attached to the stairs. Fix any carpet that is loose or worn. Avoid having throw rugs at the top or bottom of the stairs. If you do have throw  rugs, attach them to the floor with carpet tape. Make sure that you have a light switch at the top of the stairs and the bottom of the stairs. If you do not have them, ask someone to add them for you. What else can I do to help prevent falls? Wear shoes that: Do not have high heels. Have rubber bottoms. Are comfortable and fit you well. Are closed at the toe. Do not wear sandals. If you use a stepladder: Make sure that it is fully opened. Do not climb a closed stepladder. Make sure that both sides of the stepladder are locked into place. Ask someone to hold it for you, if possible. Clearly mark and make sure that you can see: Any grab bars or handrails. First and last steps. Where the  edge of each step is. Use tools that help you move around (mobility aids) if they are needed. These include: Canes. Walkers. Scooters. Crutches. Turn on the lights when you go into a dark area. Replace any light bulbs as soon as they burn out. Set up your furniture so you have a clear path. Avoid moving your furniture around. If any of your floors are uneven, fix them. If there are any pets around you, be aware of where they are. Review your medicines with your doctor. Some medicines can make you feel dizzy. This can increase your chance of falling. Ask your doctor what other things that you can do to help prevent falls. This information is not intended to replace advice given to you by your health care provider. Make sure you discuss any questions you have with your health care provider. Document Released: 02/16/2009 Document Revised: 09/28/2015 Document Reviewed: 05/27/2014 Elsevier Interactive Patient Education  2017 [employer].

## 2021-11-23 NOTE — Progress Notes (Signed)
Virtual Visit via Telephone Note  I connected with  Jason Nolan on 11/23/21 at  1:45 PM EDT by telephone and verified that I am speaking with the correct person using two identifiers.  Location: Patient: home Provider: [facility] Persons participating in the virtual visit: patient/Nurse Health Advisor   I discussed the limitations, risks, security and privacy concerns of performing an evaluation and management service by telephone and the availability of in person appointments. The patient expressed understanding and agreed to proceed.  Interactive audio and video telecommunications were attempted between this nurse and patient, however failed, due to patient having technical difficulties OR patient did not have access to video capability.  We continued and completed visit with audio only.  Some vital signs may be absent or patient reported.   [name], LPN  Subjective:   Jason Nolan is a 76 y.o. male who presents for Medicare Annual/Subsequent preventive examination.  Review of Systems           Objective:    There were no vitals filed for this visit. There is no height or weight on file to calculate BMI.     07/01/2018   12:15 PM 10/13/2017   12:02 PM 07/04/2017   10:12 AM 06/27/2017    2:02 PM 02/12/2017    9:24 AM 01/31/2017   11:12 AM 01/27/2017    4:05 PM  Advanced Directives  Does Patient Have a Medical Advance Directive? No No No No No No No  Would patient like information on creating a medical advance directive?  No - Patient declined  Yes (ED - Information included in AVS)       Current Medications (verified) Outpatient Encounter Medications as of 11/23/2021  Medication Sig   amLODipine (NORVASC) 10 MG tablet TAKE 1 TABLET BY MOUTH EVERY DAY   aspirin 81 MG chewable tablet Chew 1 tablet (81 mg total) by mouth daily.   atorvastatin (LIPITOR) 40 MG tablet TAKE 1 TABLET BY MOUTH DAILY AT 6 PM.   dorzolamide-timolol (COSOPT) 22.3-6.8 MG/ML ophthalmic solution 1  drop 2 (two) times daily.   RHOPRESSA 0.02 % SOLN Apply to eye at bedtime.   VYZULTA 0.024 % SOLN Apply 1 drop to eye at bedtime.    [DISCONTINUED] DORZOLAMIDE HCL-TIMOLOL MAL PF OP    No facility-administered encounter medications on file as of 11/23/2021.    Allergies (verified) Patient has no known allergies.   History: Past Medical History:  Diagnosis Date   Glaucoma    Hepatitis C    Stroke [facility])    Past Surgical History:  Procedure Laterality Date   CATARACT EXTRACTION, BILATERAL  01/18/2019, 02/01/2019   COLONOSCOPY WITH PROPOFOL N/A 07/01/2018   Procedure: COLONOSCOPY WITH PROPOFOL;  Surgeon: Wyline Mood, MD;  Location: [facility] ENDOSCOPY;  Service: Gastroenterology;  Laterality: N/A;   IR ANGIO INTRA EXTRACRAN SEL COM CAROTID INNOMINATE UNI L MOD SED  11/25/2016   IR ANGIO VERTEBRAL SEL SUBCLAVIAN INNOMINATE UNI R MOD SED  11/25/2016   IR ANGIO VERTEBRAL SEL VERTEBRAL UNI L MOD SED  11/25/2016   IR INTRAVSC STENT CERV CAROTID W/O EMB-PROT MOD SED INC ANGIO  11/25/2016   IR PERCUTANEOUS ART THROMBECTOMY/INFUSION INTRACRANIAL INC DIAG ANGIO  11/25/2016   IR RADIOLOGIST EVAL & MGMT  02/13/2017   RADIOLOGY WITH ANESTHESIA N/A 11/25/2016   Procedure: RADIOLOGY WITH ANESTHESIA;  Surgeon: Radiologist, Medication, MD;  Location: [facility] OR;  Service: Radiology;  Laterality: N/A;   Family History  Problem Relation Age of Onset   Hypertension  Mother    Hypertension Father    Social History   Socioeconomic History   Marital status: Married    Spouse name: Not on file   Number of children: Not on file   Years of education: Not on file   Highest education level: Not on file  Occupational History   Not on file  Tobacco Use   Smoking status: Former    Packs/day: 0.50    Years: 15.00    Total pack years: 7.50    Types: Cigarettes    Quit date: 29    Years since quitting: 33.5   Smokeless tobacco: Never  Vaping Use   Vaping Use: Never used  Substance and Sexual Activity   Alcohol  use: No    Alcohol/week: 0.0 standard drinks of alcohol   Drug use: No   Sexual activity: Yes    Partners: Female    Birth control/protection: None  Other Topics Concern   Not on file  Social History Narrative   Not on file   Social Determinants of Health   Financial Resource Strain: Not on file  Food Insecurity: Not on file  Transportation Needs: Not on file  Physical Activity: Sufficiently Active (11/23/2021)   Exercise Vital Sign    Days of Exercise per Week: 3 days    Minutes of Exercise per Session: 60 min  Stress: No Stress Concern Present (11/23/2021)   [employer] of Occupational Health - Occupational Stress Questionnaire    Feeling of Stress : Not at all  Social Connections: Not on file    Tobacco Counseling Counseling given: Not Answered   Clinical Intake:  Pre-visit preparation completed: Yes  Pain : No/denies pain     Nutritional Risks: None Diabetes: No  How often do you need to have someone help you when you read instructions, pamphlets, or other written materials from your doctor or pharmacy?: 1 - Never  Diabetic?no  Interpreter Needed?: No  Information entered by :: Kennedy Bucker, LPN   Activities of Daily Living    09/12/2021   10:17 AM  In your present state of health, do you have any difficulty performing the following activities:  Hearing? 0  Vision? 0  Difficulty concentrating or making decisions? 0  Walking or climbing stairs? 0  Dressing or bathing? 0  Doing errands, shopping? 0    Patient Care Team: [name], NP as PCP - General (Internal Medicine) Sharon Mt, MD as Referring Physician (Gastroenterology)  Indicate any recent Medical Services you may have received from other than [facility] providers in the past year (date may be approximate).     Assessment:   This is a routine wellness examination for Jason Nolan.  Hearing/Vision screen No results found.  Dietary issues and exercise activities discussed:      Goals Addressed             This Visit's Progress    DIET - EAT MORE FRUITS AND VEGETABLES         Depression Screen    11/23/2021    1:49 PM 09/12/2021   10:17 AM 08/01/2020   12:05 PM 07/07/2019    2:13 PM 07/03/2018    2:15 PM 12/11/2017   12:29 PM 09/09/2017   11:47 AM  PHQ 2/9 Scores  PHQ - 2 Score 0 0 0 0 0 0 0  PHQ- 9 Score 0 0 1  0      Fall Risk    09/12/2021   10:16 AM 08/01/2020  12:06 PM 07/07/2019    2:12 PM 10/13/2018   12:18 PM 04/13/2018   12:53 PM  Fall Risk   Falls in the past year? 0 0 0 0 0  Number falls in past yr: 0  0    Injury with Fall? 0      Risk for fall due to : No Fall Risks      Follow up Falls evaluation completed        FALL RISK PREVENTION PERTAINING TO THE HOME:  Any stairs in or around the home? No  If so, are there any without handrails? No  Home free of loose throw rugs in walkways, pet beds, electrical cords, etc? Yes  Adequate lighting in your home to reduce risk of falls? Yes   ASSISTIVE DEVICES UTILIZED TO PREVENT FALLS:  Life alert? No  Use of a cane, walker or w/c? No  Grab bars in the bathroom? Yes  Shower chair or bench in shower? Yes  Elevated toilet seat or a handicapped toilet? No    Cognitive Function:declined        Immunizations Immunization History  Administered Date(s) Administered   Fluad Quad(high Dose 65+) 01/01/2019   Influenza, High Dose Seasonal PF 01/28/2018, 01/20/2020, 02/05/2021   Influenza-Unspecified 01/20/2020   PFIZER Comirnaty(Gray Top)Covid-19 Tri-Sucrose Vaccine 08/17/2020   PFIZER(Purple Top)SARS-COV-2 Vaccination 08/05/2019, 02/08/2020, 03/07/2020   Pfizer Covid-19 Vaccine Bivalent Booster 31yrs & up 01/24/2021   Pneumococcal Conjugate-13 07/22/2013   Pneumococcal Polysaccharide-23 10/27/2014   Td 05/06/2012   Zoster Recombinat (Shingrix) 09/11/2016, 12/24/2016   Zoster, Live 02/18/2014    TDAP status: Up to date  Flu Vaccine status: Up to date  Pneumococcal vaccine status: Up to  date  Covid-19 vaccine status: Completed vaccines  Qualifies for Shingles Vaccine? Yes   Zostavax completed Yes   Shingrix Completed?: Yes  Screening Tests Health Maintenance  Topic Date Due   INFLUENZA VACCINE  12/04/2021   TETANUS/TDAP  05/06/2022   COLONOSCOPY (Pts 45-53yrs Insurance coverage will need to be confirmed)  07/01/2028   Pneumonia Vaccine 37+ Years old  Completed   COVID-19 Vaccine  Completed   Hepatitis C Screening  Completed   Zoster Vaccines- Shingrix  Completed   HPV VACCINES  Aged Out    Health Maintenance  There are no preventive care reminders to display for this patient.  Colorectal cancer screening: Type of screening: Colonoscopy. Completed 07/01/18. Repeat every 10 years  Lung Cancer Screening: (Low Dose CT Chest recommended if Age 33-80 years, 30 pack-year currently smoking OR have quit w/in 15years.) does not qualify.    Additional Screening:  Hepatitis C Screening: does qualify; Completed 08/01/20  Vision Screening: Recommended annual ophthalmology exams for early detection of glaucoma and other disorders of the eye. Is the patient up to date with their annual eye exam?  Yes  Who is the provider or what is the name of the office in which the patient attends annual eye exams?  Eye If pt is not established with a provider, would they like to be referred to a provider to establish care? No .   Dental Screening: Recommended annual dental exams for proper oral hygiene  Community Resource Referral / Chronic Care Management: CRR required this visit?  No   CCM required this visit?  No      Plan:     I have personally reviewed and noted the following in the patient's chart:   Medical and social history Use of alcohol, tobacco or illicit drugs  Current medications and supplements including opioid prescriptions. Patient is not currently taking opioid prescriptions. Functional ability and status Nutritional status Physical  activity Advanced directives List of other physicians Hospitalizations, surgeries, and ER visits in previous 12 months Vitals Screenings to include cognitive, depression, and falls Referrals and appointments  In addition, I have reviewed and discussed with patient certain preventive protocols, quality metrics, and best practice recommendations. A written personalized care plan for preventive services as well as general preventive health recommendations were provided to patient.     [name], LPN   6/76/1948   Nurse Notes: none

## 2022-03-19 ENCOUNTER — Encounter: Payer: Self-pay | Admitting: Internal Medicine

## 2022-03-19 ENCOUNTER — Ambulatory Visit (INDEPENDENT_AMBULATORY_CARE_PROVIDER_SITE_OTHER): Payer: Medicare Other | Admitting: Internal Medicine

## 2022-03-19 VITALS — BP 134/80 | HR 53 | Temp 96.8°F | Wt 205.0 lb

## 2022-03-19 DIAGNOSIS — H6121 Impacted cerumen, right ear: Secondary | ICD-10-CM

## 2022-03-19 DIAGNOSIS — E7800 Pure hypercholesterolemia, unspecified: Secondary | ICD-10-CM

## 2022-03-19 DIAGNOSIS — R7309 Other abnormal glucose: Secondary | ICD-10-CM | POA: Diagnosis not present

## 2022-03-19 DIAGNOSIS — I10 Essential (primary) hypertension: Secondary | ICD-10-CM | POA: Diagnosis not present

## 2022-03-19 DIAGNOSIS — I639 Cerebral infarction, unspecified: Secondary | ICD-10-CM

## 2022-03-19 DIAGNOSIS — E663 Overweight: Secondary | ICD-10-CM

## 2022-03-19 DIAGNOSIS — D700 Congenital agranulocytosis: Secondary | ICD-10-CM

## 2022-03-19 DIAGNOSIS — G8194 Hemiplegia, unspecified affecting left nondominant side: Secondary | ICD-10-CM

## 2022-03-19 DIAGNOSIS — D696 Thrombocytopenia, unspecified: Secondary | ICD-10-CM

## 2022-03-19 DIAGNOSIS — Z6829 Body mass index (BMI) 29.0-29.9, adult: Secondary | ICD-10-CM

## 2022-03-19 DIAGNOSIS — Z8673 Personal history of transient ischemic attack (TIA), and cerebral infarction without residual deficits: Secondary | ICD-10-CM

## 2022-03-19 NOTE — Assessment & Plan Note (Signed)
Continue atorvastatin and aspirin ?

## 2022-03-19 NOTE — Assessment & Plan Note (Signed)
Controlled on amlodipine Reinforced DASH diet and exercise for weight loss C-Met today

## 2022-03-19 NOTE — Assessment & Plan Note (Signed)
C-Met and lipid profile today Encouraged him to get him a low-fat diet Continue atorvastatin

## 2022-03-19 NOTE — Patient Instructions (Signed)

## 2022-03-19 NOTE — Assessment & Plan Note (Signed)
Stable Continue atorvastatin and aspirin

## 2022-03-19 NOTE — Progress Notes (Signed)
Subjective:    Patient ID: Jason Nolan, male    DOB: 1946-01-07, 76 y.o.   MRN: [id number]  HPI  Patient presents to clinic today for 35-month follow-up of chronic conditions.  HTN: His BP today is 134/80.  He is taking Amlodipine as prescribed.  ECG from 11/2016 reviewed.  HLD with History of Stroke: Residual left-sided weakness.  His last LDL was 35, triglycerides 57, 09/2021.  He denies myalgias on Atorvastatin.  He is taking Aspirin as well.  He does not follow with neurology.  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  He has seen hematology in the past.  Thrombocytopenia: His last platelet count was 191, 09/2021 He is taking Aspirin.  He has seen hematology in the past.  Review of Systems     Past Medical History:  Diagnosis Date   Glaucoma    Hepatitis C    Stroke [facility])     Current Outpatient Medications  Medication Sig Dispense Refill   amLODipine (NORVASC) 10 MG tablet TAKE 1 TABLET BY MOUTH EVERY DAY 90 tablet 1   aspirin 81 MG chewable tablet Chew 1 tablet (81 mg total) by mouth daily.     atorvastatin (LIPITOR) 40 MG tablet TAKE 1 TABLET BY MOUTH DAILY AT 6 PM. 90 tablet 3   dorzolamide-timolol (COSOPT) 22.3-6.8 MG/ML ophthalmic solution 1 drop 2 (two) times daily.     RHOPRESSA 0.02 % SOLN Apply to eye at bedtime.     VYZULTA 0.024 % SOLN Apply 1 drop to eye at bedtime.      No current facility-administered medications for this visit.    No Known Allergies  Family History  Problem Relation Age of Onset   Hypertension Mother    Hypertension Father     Social History   Socioeconomic History   Marital status: Married    Spouse name: Not on file   Number of children: Not on file   Years of education: Not on file   Highest education level: Not on file  Occupational History   Not on file  Tobacco Use   Smoking status: Former    Packs/day: 0.50    Years: 15.00    Total pack years: 7.50    Types: Cigarettes    Quit date: 27    Years since  quitting: 33.8   Smokeless tobacco: Never  Vaping Use   Vaping Use: Never used  Substance and Sexual Activity   Alcohol use: No    Alcohol/week: 0.0 standard drinks of alcohol   Drug use: No   Sexual activity: Yes    Partners: Female    Birth control/protection: None  Other Topics Concern   Not on file  Social History Narrative   Not on file   Social Determinants of Health   Financial Resource Strain: Low Risk  (11/23/2021)   Overall Financial Resource Strain (CARDIA)    Difficulty of Paying Living Expenses: Not hard at all  Food Insecurity: No Food Insecurity (11/23/2021)   Hunger Vital Sign    Worried About Running Out of Food in the Last Year: Never true    Ran Out of Food in the Last Year: Never true  Transportation Needs: No Transportation Needs (11/23/2021)   PRAPARE - Administrator, Civil Service (Medical): No    Lack of Transportation (Non-Medical): No  Physical Activity: Sufficiently Active (11/23/2021)   Exercise Vital Sign    Days of Exercise per Week: 3 days    Minutes of  Exercise per Session: 60 min  Stress: No Stress Concern Present (11/23/2021)   [employer] of Occupational Health - Occupational Stress Questionnaire    Feeling of Stress : Not at all  Social Connections: Moderately Isolated (11/23/2021)   Social Connection and Isolation Panel [NHANES]    Frequency of Communication with Friends and Family: More than three times a week    Frequency of Social Gatherings with Friends and Family: Once a week    Attends Religious Services: Never    Database administrator or Organizations: No    Attends Banker Meetings: Never    Marital Status: Married  Catering manager Violence: Not At Risk (11/23/2021)   Humiliation, Afraid, Rape, and Kick questionnaire    Fear of Current or Ex-Partner: No    Emotionally Abused: No    Physically Abused: No    Sexually Abused: No     Constitutional: Denies fever, malaise, fatigue, headache or  abrupt weight changes.  HEENT: Patient reports wax buildup.  Denies eye pain, eye redness, ear pain, ringing in the ears, runny nose, nasal congestion, bloody nose, or sore throat. Respiratory: Denies difficulty breathing, shortness of breath, cough or sputum production.   Cardiovascular: Denies chest pain, chest tightness, palpitations or swelling in the hands or feet.  Gastrointestinal: Denies abdominal pain, bloating, constipation, diarrhea or blood in the stool.  GU: Denies urgency, frequency, pain with urination, burning sensation, blood in urine, odor or discharge. Musculoskeletal: Pt reports left hand stiffness. Denies decrease in range of motion, difficulty with gait, muscle pain or joint pain and swelling.  Skin: Denies redness, rashes, lesions or ulcercations.  Neurological: Denies dizziness, difficulty with memory, difficulty with speech or problems with balance and coordination.  Psych: Denies anxiety, depression, SI/HI.  No other specific complaints in a complete review of systems (except as listed in HPI above).  Objective:   Physical Exam  BP 134/80 (BP Location: Left Arm, Patient Position: Sitting, Cuff Size: Normal)   Pulse (!) 53   Temp (!) 96.8 F (36 C) (Temporal)   Wt 205 lb (93 kg)   SpO2 99%   BMI 29.41 kg/m   Wt Readings from Last 3 Encounters:  11/23/21 206 lb (93.4 kg)  09/12/21 206 lb (93.4 kg)  08/01/20 202 lb (91.6 kg)    General: Appears his stated age, overweight, in NAD. Skin: Warm, dry and intact.  HEENT: Head: normal shape and size; Eyes: sclera white, no icterus, conjunctiva pink, PERRLA and EOMs intact; Ears: Cerumen impaction right ear;  Cardiovascular: Bradycardic with normal rhythm. S1,S2 noted.  No murmur, rubs or gallops noted. No JVD or BLE edema. No carotid bruits noted. Pulmonary/Chest: Normal effort and positive vesicular breath sounds. No respiratory distress. No wheezes, rales or ronchi noted.  Musculoskeletal: Strength 5/5 BUE.   Handgrips equal.  No difficulty with gait.  Neurological: Alert and oriented.  Coordination normal.     BMET    Component Value Date/Time   NA 138 09/12/2021 1007   K 3.9 09/12/2021 1007   CL 105 09/12/2021 1007   CO2 24 09/12/2021 1007   GLUCOSE 96 09/12/2021 1007   BUN 15 09/12/2021 1007   CREATININE 0.98 09/12/2021 1007   CALCIUM 8.8 09/12/2021 1007   GFRNONAA >60 12/19/2016 0406   GFRAA >60 12/19/2016 0406    Lipid Panel     Component Value Date/Time   CHOL 108 09/12/2021 1007   TRIG 57 09/12/2021 1007   HDL 60 09/12/2021 1007   CHOLHDL  1.8 09/12/2021 1007   VLDL 17.2 08/01/2020 1229   LDLCALC 35 09/12/2021 1007    CBC    Component Value Date/Time   WBC 2.7 (L) 09/12/2021 1007   RBC 4.85 09/12/2021 1007   HGB 14.9 09/12/2021 1007   HCT 45.5 09/12/2021 1007   PLT 191 09/12/2021 1007   MCV 93.8 09/12/2021 1007   MCH 30.7 09/12/2021 1007   MCHC 32.7 09/12/2021 1007   RDW 12.7 09/12/2021 1007   LYMPHSABS 1,158 09/12/2021 1007   MONOABS 0.2 06/20/2017 1605   EOSABS 122 09/12/2021 1007   BASOSABS 22 09/12/2021 1007    Hgb A1C Lab Results  Component Value Date   HGBA1C 5.6 09/12/2021            Assessment & Plan:   Cerumen Impaction:  Manual lavage by CMA Advised him to try Debrox OTC 2 times weekly to prevent wax buildup  RTC in 6 months, follow-up chronic conditions Nicki Reaper, NP

## 2022-03-19 NOTE — Assessment & Plan Note (Signed)
CBC today.  

## 2022-03-19 NOTE — Assessment & Plan Note (Signed)
Encourage diet and exercise for weight loss 

## 2022-03-20 LAB — CBC
HCT: 46.5 % (ref 38.5–50.0)
Hemoglobin: 15.6 g/dL (ref 13.2–17.1)
MCH: 31 pg (ref 27.0–33.0)
MCHC: 33.5 g/dL (ref 32.0–36.0)
MCV: 92.4 fL (ref 80.0–100.0)
MPV: 10.1 fL (ref 7.5–12.5)
Platelets: 182 10*3/uL (ref 140–400)
RBC: 5.03 10*6/uL (ref 4.20–5.80)
RDW: 12.4 % (ref 11.0–15.0)
WBC: 2.2 10*3/uL — ABNORMAL LOW (ref 3.8–10.8)

## 2022-03-20 LAB — COMPLETE METABOLIC PANEL WITH GFR
AG Ratio: 1.6 (calc) (ref 1.0–2.5)
ALT: 23 U/L (ref 9–46)
AST: 20 U/L (ref 10–35)
Albumin: 4.2 g/dL (ref 3.6–5.1)
Alkaline phosphatase (APISO): 63 U/L (ref 35–144)
BUN: 10 mg/dL (ref 7–25)
CO2: 25 mmol/L (ref 20–32)
Calcium: 8.9 mg/dL (ref 8.6–10.3)
Chloride: 107 mmol/L (ref 98–110)
Creat: 0.89 mg/dL (ref 0.70–1.28)
Globulin: 2.7 g/dL (calc) (ref 1.9–3.7)
Glucose, Bld: 83 mg/dL (ref 65–99)
Potassium: 4.1 mmol/L (ref 3.5–5.3)
Sodium: 141 mmol/L (ref 135–146)
Total Bilirubin: 0.7 mg/dL (ref 0.2–1.2)
Total Protein: 6.9 g/dL (ref 6.1–8.1)
eGFR: 90 mL/min/{1.73_m2} (ref >=60)

## 2022-03-20 LAB — HEMOGLOBIN A1C
Hgb A1c MFr Bld: 5.8 % of total Hgb — ABNORMAL HIGH (ref <5.7)
Mean Plasma Glucose: 120 mg/dL
eAG (mmol/L): 6.6 mmol/L

## 2022-03-20 LAB — LIPID PANEL
Cholesterol: 117 mg/dL (ref <200)
HDL: 62 mg/dL (ref >=40)
LDL Cholesterol (Calc): 41 mg/dL (calc)
Non-HDL Cholesterol (Calc): 55 mg/dL (calc) (ref <130)
Total CHOL/HDL Ratio: 1.9 (calc) (ref <5.0)
Triglycerides: 64 mg/dL (ref <150)

## 2022-04-14 ENCOUNTER — Other Ambulatory Visit: Payer: Self-pay | Admitting: Internal Medicine

## 2022-04-14 DIAGNOSIS — I10 Essential (primary) hypertension: Secondary | ICD-10-CM

## 2022-04-15 NOTE — Telephone Encounter (Signed)
Requested Prescriptions  Pending Prescriptions Disp Refills   amLODipine (NORVASC) 10 MG tablet [Pharmacy Med Name: AMLODIPINE BESYLATE 10 MG TAB] 90 tablet 1    Sig: TAKE 1 TABLET BY MOUTH EVERY DAY     Cardiovascular: Calcium Channel Blockers 2 Passed - 04/14/2022  8:49 AM      Passed - Last BP in normal range    BP Readings from Last 1 Encounters:  03/19/22 134/80         Passed - Last Heart Rate in normal range    Pulse Readings from Last 1 Encounters:  03/19/22 (!) 53         Passed - Valid encounter within last 6 months    Recent Outpatient Visits           3 weeks ago Pure hypercholesterolemia   [facility] [place], Salvadore Oxford, NP   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

## 2022-08-16 ENCOUNTER — Other Ambulatory Visit: Payer: Self-pay | Admitting: Internal Medicine

## 2022-08-16 DIAGNOSIS — I10 Essential (primary) hypertension: Secondary | ICD-10-CM

## 2022-08-16 DIAGNOSIS — I639 Cerebral infarction, unspecified: Secondary | ICD-10-CM

## 2022-08-16 NOTE — Telephone Encounter (Signed)
Requested Prescriptions  Pending Prescriptions Disp Refills   atorvastatin (LIPITOR) 40 MG tablet [Pharmacy Med Name: ATORVASTATIN 40 MG TABLET] 90 tablet 0    Sig: TAKE 1 TABLET BY MOUTH EVERY DAY AT 6PM     Cardiovascular:  Antilipid - Statins Failed - 08/16/2022 12:49 AM      Failed - Lipid Panel in normal range within the last 12 months    Cholesterol  Date Value Ref Range Status  03/19/2022 117 <200 mg/dL Final   LDL Cholesterol (Calc)  Date Value Ref Range Status  03/19/2022 41 mg/dL (calc) Final    Comment:    Reference range: <100 . Desirable range <100 mg/dL for primary prevention;   <70 mg/dL for patients with CHD or diabetic patients  with > or = 2 CHD risk factors. Marland Kitchen LDL-C is now calculated using the Martin-Hopkins  calculation, which is a validated novel method providing  better accuracy than the Friedewald equation in the  estimation of LDL-C.  Horald Pollen et al. Lenox Ahr. 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[facility])   [place] [place], Salvadore Oxford, NP       Future Appointments             In 1 month Baity, Salvadore Oxford, NP [facility]             amLODipine (NORVASC) 10 MG tablet [Pharmacy Med Name: AMLODIPINE BESYLATE 10 MG TAB] 90 tablet 0    Sig: TAKE 1 TABLET BY MOUTH EVERY DAY     Cardiovascular: Calcium Channel Blockers 2 Passed - 08/16/2022 12:49 AM      Passed - Last BP in normal range    BP Readings from Last 1 Encounters:   03/19/22 134/80         Passed - Last Heart Rate in normal range    Pulse Readings from Last 1 Encounters:  03/19/22 (!) 53         Passed - Valid encounter within last 6 months    Recent Outpatient Visits           5 months ago Pure hypercholesterolemia   [place] [facility] [place], Salvadore Oxford, NP   11 months ago 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

## 2022-09-17 ENCOUNTER — Ambulatory Visit (INDEPENDENT_AMBULATORY_CARE_PROVIDER_SITE_OTHER): Payer: Medicare HMO | Admitting: Internal Medicine

## 2022-09-17 ENCOUNTER — Encounter: Payer: Self-pay | Admitting: Internal Medicine

## 2022-09-17 VITALS — BP 108/64 | HR 55 | Temp 97.1°F | Ht 70.0 in | Wt 209.0 lb

## 2022-09-17 DIAGNOSIS — Z6829 Body mass index (BMI) 29.0-29.9, adult: Secondary | ICD-10-CM

## 2022-09-17 DIAGNOSIS — Z0001 Encounter for general adult medical examination with abnormal findings: Secondary | ICD-10-CM | POA: Diagnosis not present

## 2022-09-17 DIAGNOSIS — Z125 Encounter for screening for malignant neoplasm of prostate: Secondary | ICD-10-CM | POA: Diagnosis not present

## 2022-09-17 DIAGNOSIS — E663 Overweight: Secondary | ICD-10-CM

## 2022-09-17 DIAGNOSIS — R7303 Prediabetes: Secondary | ICD-10-CM

## 2022-09-17 NOTE — Patient Instructions (Signed)
Health Maintenance After Age 75 After age 75, you are at a higher risk for certain long-term diseases and infections as well as injuries from falls. Falls are a major cause of broken bones and head injuries in people who are older than age 75. Getting regular preventive care can help to keep you healthy and well. Preventive care includes getting regular testing and making lifestyle changes as recommended by your health care provider. Talk with your health care provider about: Which screenings and tests you should have. A screening is a test that checks for a disease when you have no symptoms. A diet and exercise plan that is right for you. What should I know about screenings and tests to prevent falls? Screening and testing are the best ways to find a health problem early. Early diagnosis and treatment give you the best chance of managing medical conditions that are common after age 75. Certain conditions and lifestyle choices may make you more likely to have a fall. Your health care provider may recommend: Regular vision checks. Poor vision and conditions such as cataracts can make you more likely to have a fall. If you wear glasses, make sure to get your prescription updated if your vision changes. Medicine review. Work with your health care provider to regularly review all of the medicines you are taking, including over-the-counter medicines. Ask your health care provider about any side effects that may make you more likely to have a fall. Tell your health care provider if any medicines that you take make you feel dizzy or sleepy. Strength and balance checks. Your health care provider may recommend certain tests to check your strength and balance while standing, walking, or changing positions. Foot health exam. Foot pain and numbness, as well as not wearing proper footwear, can make you more likely to have a fall. Screenings, including: Osteoporosis screening. Osteoporosis is a condition that causes  the bones to get weaker and break more easily. Blood pressure screening. Blood pressure changes and medicines to control blood pressure can make you feel dizzy. Depression screening. You may be more likely to have a fall if you have a fear of falling, feel depressed, or feel unable to do activities that you used to do. Alcohol use screening. Using too much alcohol can affect your balance and may make you more likely to have a fall. Follow these instructions at home: Lifestyle Do not drink alcohol if: Your health care provider tells you not to drink. If you drink alcohol: Limit how much you have to: 0-1 drink a day for women. 0-2 drinks a day for men. Know how much alcohol is in your drink. In the [place] one drink equals one 12 oz bottle of beer (355 mL), one 5 oz glass of wine (148 mL), or one 1 oz glass of hard liquor (44 mL). Do not use any products that contain nicotine or tobacco. These products include cigarettes, chewing tobacco, and vaping devices, such as e-cigarettes. If you need help quitting, ask your health care provider. Activity  Follow a regular exercise program to stay fit. This will help you maintain your balance. Ask your health care provider what types of exercise are appropriate for you. If you need a cane or walker, use it as recommended by your health care provider. Wear supportive shoes that have nonskid soles. Safety  Remove any tripping hazards, such as rugs, cords, and clutter. Install safety equipment such as grab bars in bathrooms and safety rails on stairs. Keep rooms and walkways   well-lit. General instructions Talk with your health care provider about your risks for falling. Tell your health care provider if: You fall. Be sure to tell your health care provider about all falls, even ones that seem minor. You feel dizzy, tiredness (fatigue), or off-balance. Take over-the-counter and prescription medicines only as told by your health care provider. These include  supplements. Eat a healthy diet and maintain a healthy weight. A healthy diet includes low-fat dairy products, low-fat (lean) meats, and fiber from whole grains, beans, and lots of fruits and vegetables. Stay current with your vaccines. Schedule regular health, dental, and eye exams. Summary Having a healthy lifestyle and getting preventive care can help to protect your health and wellness after age 75. Screening and testing are the best way to find a health problem early and help you avoid having a fall. Early diagnosis and treatment give you the best chance for managing medical conditions that are more common for people who are older than age 75. Falls are a major cause of broken bones and head injuries in people who are older than age 75. Take precautions to prevent a fall at home. Work with your health care provider to learn what changes you can make to improve your health and wellness and to prevent falls. This information is not intended to replace advice given to you by your health care provider. Make sure you discuss any questions you have with your health care provider. Document Revised: 09/11/2020 Document Reviewed: 09/11/2020 Elsevier Patient Education  2023 Elsevier Inc.  

## 2022-09-17 NOTE — Assessment & Plan Note (Signed)
Encourage diet and exercise for weight loss 

## 2022-09-17 NOTE — Progress Notes (Signed)
Subjective:    Patient ID: Jason Nolan, male    DOB: 16-Nov-1945, 77 y.o.   MRN: [id number]  HPI  Patient presents to clinic today for his annual exam.  Flu: 02/2022 Tetanus: 05/2012 COVID: Pfizer x 5 Pneumovax: 10/2014 Prevnar: 07/2013 Zostavax: 02/2014 Shingrix: 09/2016, 12/2016 PSA screening: 07/2020 Colon screening: 06/2018 Vision screening: annually Dentist: biannually  Diet: He does eat meat. He consumes fruits and veggies. He tries to avoid fried foods. He drinks mostly water, tea. Exercise: Golfing  Review of Systems     Past Medical History:  Diagnosis Date   Glaucoma    Hepatitis C    Stroke [facility])     Current Outpatient Medications  Medication Sig Dispense Refill   amLODipine (NORVASC) 10 MG tablet TAKE 1 TABLET BY MOUTH EVERY DAY 90 tablet 0   aspirin 81 MG chewable tablet Chew 1 tablet (81 mg total) by mouth daily.     atorvastatin (LIPITOR) 40 MG tablet TAKE 1 TABLET BY MOUTH EVERY DAY AT 6PM 90 tablet 0   dorzolamide-timolol (COSOPT) 22.3-6.8 MG/ML ophthalmic solution 1 drop 2 (two) times daily.     RHOPRESSA 0.02 % SOLN Apply to eye at bedtime.     VYZULTA 0.024 % SOLN Apply 1 drop to eye at bedtime.      No current facility-administered medications for this visit.    No Known Allergies  Family History  Problem Relation Age of Onset   Hypertension Mother    Hypertension Father     Social History   Socioeconomic History   Marital status: Married    Spouse name: Not on file   Number of children: Not on file   Years of education: Not on file   Highest education level: Some college, no degree  Occupational History   Not on file  Tobacco Use   Smoking status: Former    Packs/day: 0.50    Years: 15.00    Additional pack years: 0.00    Total pack years: 7.50    Types: Cigarettes    Quit date: 1990    Years since quitting: 34.3   Smokeless tobacco: Never  Vaping Use   Vaping Use: Never used  Substance and Sexual Activity   Alcohol use:  No    Alcohol/week: 0.0 standard drinks of alcohol   Drug use: No   Sexual activity: Yes    Partners: Female    Birth control/protection: None  Other Topics Concern   Not on file  Social History Narrative   Not on file   Social Determinants of Health   Financial Resource Strain: Low Risk  (09/14/2022)   Overall Financial Resource Strain (CARDIA)    Difficulty of Paying Living Expenses: Not hard at all  Food Insecurity: No Food Insecurity (09/14/2022)   Hunger Vital Sign    Worried About Running Out of Food in the Last Year: Never true    Ran Out of Food in the Last Year: Never true  Transportation Needs: No Transportation Needs (09/14/2022)   PRAPARE - Administrator, Civil Service (Medical): No    Lack of Transportation (Non-Medical): No  Physical Activity: Sufficiently Active (09/14/2022)   Exercise Vital Sign    Days of Exercise per Week: 4 days    Minutes of Exercise per Session: 120 min  Stress: No Stress Concern Present (09/14/2022)   [employer] of Occupational Health - Occupational Stress Questionnaire    Feeling of Stress : Only a little  Social Connections:  Socially Integrated (09/14/2022)   Social Connection and Isolation Panel [NHANES]    Frequency of Communication with Friends and Family: More than three times a week    Frequency of Social Gatherings with Friends and Family: More than three times a week    Attends Religious Services: 1 to 4 times per year    Active Member of [employer] or Organizations: Yes    Attends Engineer, structural: More than 4 times per year    Marital Status: Married  Catering manager Violence: Not At Risk (11/23/2021)   Humiliation, Afraid, Rape, and Kick questionnaire    Fear of Current or Ex-Partner: No    Emotionally Abused: No    Physically Abused: No    Sexually Abused: No     Constitutional: Pt reports frequent headaches. Denies fever, malaise, fatigue, or abrupt weight changes.  HEENT: Denies eye pain, eye  redness, ear pain, ringing in the ears, wax buildup, runny nose, nasal congestion, bloody nose, or sore throat. Respiratory: Denies difficulty breathing, shortness of breath, cough or sputum production.   Cardiovascular: Denies chest pain, chest tightness, palpitations or swelling in the hands or feet.  Gastrointestinal: Denies abdominal pain, bloating, constipation, diarrhea or blood in the stool.  GU: Denies urgency, frequency, pain with urination, burning sensation, blood in urine, odor or discharge. Musculoskeletal: Patient reports left-sided weakness.  Denies decrease in range of motion, difficulty with gait, muscle pain or joint pain and swelling.  Skin: Denies redness, rashes, lesions or ulcercations.  Neurological: Denies dizziness, difficulty with memory, difficulty with speech or problems with balance and coordination.  Psych: Denies anxiety, depression, SI/HI.  No other specific complaints in a complete review of systems (except as listed in HPI above).  Objective:   Physical Exam   BP 108/64 (BP Location: Left Arm, Patient Position: Sitting, Cuff Size: Large)   Pulse (!) 55   Temp (!) 97.1 F (36.2 C) (Temporal)   Ht 5\' 10"  (1.778 m)   Wt 209 lb (94.8 kg)   SpO2 100%   BMI 29.99 kg/m   Wt Readings from Last 3 Encounters:  03/19/22 205 lb (93 kg)  11/23/21 206 lb (93.4 kg)  09/12/21 206 lb (93.4 kg)    General: Appears his stated age, overweight, in NAD. Skin: Warm, dry and intact.  HEENT: Head: normal shape and size; Eyes: sclera white, no icterus, conjunctiva pink, PERRLA and EOMs intact;  Neck:  Neck supple, trachea midline. No masses, lumps or thyromegaly present.  Cardiovascular: Bradycardic with normal rhythm. S1,S2 noted.  No murmur, rubs or gallops noted. No JVD or BLE edema. No carotid bruits noted. Pulmonary/Chest: Normal effort and positive vesicular breath sounds. No respiratory distress. No wheezes, rales or ronchi noted.  Abdomen: Normal bowel sounds.   Musculoskeletal: Strength 5/5 BUE.  Strength 4/5 LLE, 5/5 RLE.  No difficulty with gait.  Neurological: Alert and oriented. Cranial nerves II-XII grossly intact. Coordination normal.  Psychiatric: Mood and affect normal. Behavior is normal. Judgment and thought content normal.    BMET    Component Value Date/Time   NA 141 03/19/2022 1020   K 4.1 03/19/2022 1020   CL 107 03/19/2022 1020   CO2 25 03/19/2022 1020   GLUCOSE 83 03/19/2022 1020   BUN 10 03/19/2022 1020   CREATININE 0.89 03/19/2022 1020   CALCIUM 8.9 03/19/2022 1020   GFRNONAA >60 12/19/2016 0406   GFRAA >60 12/19/2016 0406    Lipid Panel     Component Value Date/Time   CHOL  117 03/19/2022 1020   TRIG 64 03/19/2022 1020   HDL 62 03/19/2022 1020   CHOLHDL 1.9 03/19/2022 1020   VLDL 17.2 08/01/2020 1229   LDLCALC 41 03/19/2022 1020    CBC    Component Value Date/Time   WBC 2.2 (L) 03/19/2022 1020   RBC 5.03 03/19/2022 1020   HGB 15.6 03/19/2022 1020   HCT 46.5 03/19/2022 1020   PLT 182 03/19/2022 1020   MCV 92.4 03/19/2022 1020   MCH 31.0 03/19/2022 1020   MCHC 33.5 03/19/2022 1020   RDW 12.4 03/19/2022 1020   LYMPHSABS 1,158 09/12/2021 1007   MONOABS 0.2 06/20/2017 1605   EOSABS 122 09/12/2021 1007   BASOSABS 22 09/12/2021 1007    Hgb A1C Lab Results  Component Value Date   HGBA1C 5.8 (H) 03/19/2022           Assessment & Plan:   Preventative Health Maintenance:  Encouraged him to get a flu shot in fall He declines tetanus for financial reasons, advised him if he gets bit or cut to go get this done Encouraged him to get his COVID booster Pneumovax and Prevnar 13 UTD He declines Prevnar 20 today Zostavax and Shingrix UTD Colon screening UTD Encouraged him to consume a balanced diet and exercise regimen Advised him to see an eye doctor and dentist annually We will check CBC, c-Met, lipid, A1c and PSA today  RTC in 6 months, follow-up chronic conditions Nicki Reaper, NP

## 2022-09-18 LAB — COMPLETE METABOLIC PANEL WITH GFR
AG Ratio: 1.5 (calc) (ref 1.0–2.5)
ALT: 27 U/L (ref 9–46)
AST: 22 U/L (ref 10–35)
Albumin: 4.1 g/dL (ref 3.6–5.1)
Alkaline phosphatase (APISO): 64 U/L (ref 35–144)
BUN: 9 mg/dL (ref 7–25)
CO2: 26 mmol/L (ref 20–32)
Calcium: 9.2 mg/dL (ref 8.6–10.3)
Chloride: 107 mmol/L (ref 98–110)
Creat: 0.92 mg/dL (ref 0.70–1.28)
Globulin: 2.7 g/dL (calc) (ref 1.9–3.7)
Glucose, Bld: 100 mg/dL — ABNORMAL HIGH (ref 65–99)
Potassium: 4.3 mmol/L (ref 3.5–5.3)
Sodium: 140 mmol/L (ref 135–146)
Total Bilirubin: 0.8 mg/dL (ref 0.2–1.2)
Total Protein: 6.8 g/dL (ref 6.1–8.1)
eGFR: 87 mL/min/{1.73_m2} (ref >=60)

## 2022-09-18 LAB — CBC
HCT: 45.7 % (ref 38.5–50.0)
Hemoglobin: 15.1 g/dL (ref 13.2–17.1)
MCH: 30.6 pg (ref 27.0–33.0)
MCHC: 33 g/dL (ref 32.0–36.0)
MCV: 92.7 fL (ref 80.0–100.0)
MPV: 10.4 fL (ref 7.5–12.5)
Platelets: 182 10*3/uL (ref 140–400)
RBC: 4.93 10*6/uL (ref 4.20–5.80)
RDW: 12.7 % (ref 11.0–15.0)
WBC: 2.3 10*3/uL — ABNORMAL LOW (ref 3.8–10.8)

## 2022-09-18 LAB — LIPID PANEL
Cholesterol: 118 mg/dL (ref <200)
HDL: 63 mg/dL (ref >=40)
LDL Cholesterol (Calc): 39 mg/dL (calc)
Non-HDL Cholesterol (Calc): 55 mg/dL (calc) (ref <130)
Total CHOL/HDL Ratio: 1.9 (calc) (ref <5.0)
Triglycerides: 75 mg/dL (ref <150)

## 2022-09-18 LAB — HEMOGLOBIN A1C
Hgb A1c MFr Bld: 5.8 % of total Hgb — ABNORMAL HIGH (ref <5.7)
Mean Plasma Glucose: 120 mg/dL
eAG (mmol/L): 6.6 mmol/L

## 2022-09-18 LAB — PSA: PSA: 0.22 ng/mL (ref <=4.00)

## 2022-11-29 ENCOUNTER — Ambulatory Visit (INDEPENDENT_AMBULATORY_CARE_PROVIDER_SITE_OTHER): Payer: Medicare HMO

## 2022-11-29 VITALS — Ht 70.0 in | Wt 209.0 lb

## 2022-11-29 DIAGNOSIS — Z0000 Encounter for general adult medical examination without abnormal findings: Secondary | ICD-10-CM | POA: Diagnosis not present

## 2022-11-29 NOTE — Progress Notes (Signed)
Subjective:   Jason Nolan is a 77 y.o. male who presents for Medicare Annual/Subsequent preventive examination.  Visit Complete: Virtual  I connected with  Jason Nolan on 11/29/22 by a audio enabled telemedicine application and verified that I am speaking with the correct person using two identifiers.  Patient Location: Home  Provider Location: Office/Clinic  I discussed the limitations of evaluation and management by telemedicine. The patient expressed understanding and agreed to proceed.  Review of Systems     Cardiac Risk Factors include: advanced age (>65men, >22 women);male gender;hypertension     Objective:    Today's Vitals   11/29/22 1038  Weight: 209 lb (94.8 kg)  Height: 5\' 10"  (1.778 m)   Body mass index is 29.99 kg/m.     11/29/2022   10:31 AM 11/23/2021    1:51 PM 07/01/2018   12:15 PM 10/13/2017   12:02 PM 07/04/2017   10:12 AM 06/27/2017    2:02 PM 02/12/2017    9:24 AM  Advanced Directives  Does Patient Have a Medical Advance Directive? No No No No No No No  Would patient like information on creating a medical advance directive? No - Patient declined No - Patient declined  No - Patient declined  Yes (ED - Information included in AVS)     Current Medications (verified) Outpatient Encounter Medications as of 11/29/2022  Medication Sig   amLODipine (NORVASC) 10 MG tablet TAKE 1 TABLET BY MOUTH EVERY DAY   aspirin 81 MG chewable tablet Chew 1 tablet (81 mg total) by mouth daily.   atorvastatin (LIPITOR) 40 MG tablet TAKE 1 TABLET BY MOUTH EVERY DAY AT 6PM   dorzolamide-timolol (COSOPT) 22.3-6.8 MG/ML ophthalmic solution 1 drop 2 (two) times daily.   RHOPRESSA 0.02 % SOLN Apply to eye at bedtime.   VYZULTA 0.024 % SOLN Apply 1 drop to eye at bedtime.    No facility-administered encounter medications on file as of 11/29/2022.    Allergies (verified) Patient has no known allergies.   History: Past Medical History:  Diagnosis Date   Glaucoma     Hepatitis C    Stroke [facility])    Past Surgical History:  Procedure Laterality Date   CATARACT EXTRACTION, BILATERAL  01/18/2019, 02/01/2019   COLONOSCOPY WITH PROPOFOL N/A 07/01/2018   Procedure: COLONOSCOPY WITH PROPOFOL;  Surgeon: Wyline Mood, MD;  Location: [facility] ENDOSCOPY;  Service: Gastroenterology;  Laterality: N/A;   IR ANGIO INTRA EXTRACRAN SEL COM CAROTID INNOMINATE UNI L MOD SED  11/25/2016   IR ANGIO VERTEBRAL SEL SUBCLAVIAN INNOMINATE UNI R MOD SED  11/25/2016   IR ANGIO VERTEBRAL SEL VERTEBRAL UNI L MOD SED  11/25/2016   IR INTRAVSC STENT CERV CAROTID W/O EMB-PROT MOD SED INC ANGIO  11/25/2016   IR PERCUTANEOUS ART THROMBECTOMY/INFUSION INTRACRANIAL INC DIAG ANGIO  11/25/2016   IR RADIOLOGIST EVAL & MGMT  02/13/2017   RADIOLOGY WITH ANESTHESIA N/A 11/25/2016   Procedure: RADIOLOGY WITH ANESTHESIA;  Surgeon: Radiologist, Medication, MD;  Location: [facility] OR;  Service: Radiology;  Laterality: N/A;   Family History  Problem Relation Age of Onset   Hypertension Mother    Hypertension Father    Colon cancer Neg Hx    Prostate cancer Neg Hx    Social History   Socioeconomic History   Marital status: Married    Spouse name: Not on file   Number of children: Not on file   Years of education: Not on file   Highest education level: Some college, no degree  Occupational History   Not on file  Tobacco Use   Smoking status: Former    Current packs/day: 0.00    Average packs/day: 0.5 packs/day for 15.0 years (7.5 ttl pk-yrs)    Types: Cigarettes    Start date: 96    Quit date: 72    Years since quitting: 34.5   Smokeless tobacco: Never  Vaping Use   Vaping status: Never Used  Substance and Sexual Activity   Alcohol use: No    Alcohol/week: 0.0 standard drinks of alcohol   Drug use: No   Sexual activity: Yes    Partners: Female    Birth control/protection: None  Other Topics Concern   Not on file  Social History Narrative   Not on file   Social Determinants of Health    Financial Resource Strain: Low Risk  (11/29/2022)   Overall Financial Resource Strain (CARDIA)    Difficulty of Paying Living Expenses: Not hard at all  Food Insecurity: No Food Insecurity (11/29/2022)   Hunger Vital Sign    Worried About Running Out of Food in the Last Year: Never true    Ran Out of Food in the Last Year: Never true  Transportation Needs: No Transportation Needs (11/29/2022)   PRAPARE - Administrator, Civil Service (Medical): No    Lack of Transportation (Non-Medical): No  Physical Activity: Sufficiently Active (11/29/2022)   Exercise Vital Sign    Days of Exercise per Week: 4 days    Minutes of Exercise per Session: 120 min  Stress: No Stress Concern Present (11/29/2022)   [employer] of Occupational Health - Occupational Stress Questionnaire    Feeling of Stress : Not at all  Social Connections: Socially Integrated (11/29/2022)   Social Connection and Isolation Panel [NHANES]    Frequency of Communication with Friends and Family: More than three times a week    Frequency of Social Gatherings with Friends and Family: More than three times a week    Attends Religious Services: 1 to 4 times per year    Active Member of [employer] or Organizations: Yes    Attends Engineer, structural: More than 4 times per year    Marital Status: Married    Tobacco Counseling Counseling given: Not Answered   Clinical Intake:  Pre-visit preparation completed: Yes  Pain : No/denies pain     Nutritional Risks: None Diabetes: No  How often do you need to have someone help you when you read instructions, pamphlets, or other written materials from your doctor or pharmacy?: 1 - Never  Interpreter Needed?: No  Information entered by :: Kennedy Bucker, LPN   Activities of Daily Living    11/29/2022   10:32 AM 11/27/2022    1:11 PM  In your present state of health, do you have any difficulty performing the following activities:  Hearing? 0 0  Vision? 0 0   Difficulty concentrating or making decisions? 0 0  Walking or climbing stairs? 0 0  Dressing or bathing? 0 0  Doing errands, shopping? 0 0  Preparing Food and eating ? N N  Using the Toilet? N N  In the past six months, have you accidently leaked urine? N N  Do you have problems with loss of bowel control? N N  Managing your Medications? N N  Managing your Finances? N N  Housekeeping or managing your Housekeeping? N N    Patient Care Team: [name], NP as PCP - General (Internal Medicine)  Sharon Mt, MD as Referring Physician (Gastroenterology)  Indicate any recent Medical Services you may have received from other than [facility] providers in the past year (date may be approximate).     Assessment:   This is a routine wellness examination for Ly.  Hearing/Vision screen Hearing Screening - Comments:: No aids Vision Screening - Comments:: No glasses- Dr.Brasington  Dietary issues and exercise activities discussed:     Goals Addressed             This Visit's Progress    DIET - INCREASE WATER INTAKE         Depression Screen    11/29/2022   10:29 AM 09/17/2022   10:18 AM 11/23/2021    1:49 PM 09/12/2021   10:17 AM 08/01/2020   12:05 PM 07/07/2019    2:13 PM 07/03/2018    2:15 PM  PHQ 2/9 Scores  PHQ - 2 Score 0 0 0 0 0 0 0  PHQ- 9 Score 0  0 0 1  0    Fall Risk    11/29/2022   10:31 AM 11/27/2022    1:11 PM 09/17/2022   10:18 AM 11/23/2021    1:52 PM 09/12/2021   10:16 AM  Fall Risk   Falls in the past year? 0 0 0 0 0  Number falls in past yr: 0   0 0  Injury with Fall? 0  0 0 0  Risk for fall due to : No Fall Risks  No Fall Risks No Fall Risks No Fall Risks  Follow up Falls prevention discussed;Falls evaluation completed   Falls evaluation completed Falls evaluation completed    MEDICARE RISK AT HOME:  Medicare Risk at Home - 11/29/22 1032     Any stairs in or around the home? No    If so, are there any without handrails? No    Home free of loose  throw rugs in walkways, pet beds, electrical cords, etc? Yes    Adequate lighting in your home to reduce risk of falls? Yes    Life alert? No    Use of a cane, walker or w/c? No    Grab bars in the bathroom? Yes    Shower chair or bench in shower? Yes    Elevated toilet seat or a handicapped toilet? No             TIMED UP AND GO:  Was the test performed?  No    Cognitive Function:        11/29/2022   10:33 AM  6CIT Screen  What Year? 0 points  What month? 0 points  What time? 0 points  Count back from 20 0 points  Months in reverse 0 points  Repeat phrase 2 points  Total Score 2 points    Immunizations Immunization History  Administered Date(s) Administered   COVID-19, mRNA, vaccine(Comirnaty)12 years and older 03/08/2022   Fluad Quad(high Dose 65+) 01/01/2019, 01/14/2022   Influenza, High Dose Seasonal PF 01/28/2018, 01/20/2020, 02/05/2021   Influenza-Unspecified 01/20/2020   PFIZER Comirnaty(Gray Top)Covid-19 Tri-Sucrose Vaccine 08/17/2020   PFIZER(Purple Top)SARS-COV-2 Vaccination 08/05/2019, 02/08/2020, 03/07/2020   Pfizer Covid-19 Vaccine Bivalent Booster 35yrs & up 01/24/2021   Pneumococcal Conjugate-13 07/22/2013   Pneumococcal Polysaccharide-23 10/27/2014   Td 05/06/2012   Zoster Recombinant(Shingrix) 09/11/2016, 12/24/2016   Zoster, Live 02/18/2014    TDAP status: Due, Education has been provided regarding the importance of this vaccine. Advised may receive this vaccine at local pharmacy or Health Dept. Aware  to provide a copy of the vaccination record if obtained from local pharmacy or Health Dept. Verbalized acceptance and understanding.  Flu Vaccine status: Up to date  Pneumococcal vaccine status: Up to date  Covid-19 vaccine status: Completed vaccines  Qualifies for Shingles Vaccine? Yes   Zostavax completed Yes   Shingrix Completed?: Yes  Screening Tests Health Maintenance  Topic Date Due   DTaP/Tdap/Td (2 - Tdap) 05/06/2022   INFLUENZA  VACCINE  12/05/2022   Medicare Annual Wellness (AWV)  11/29/2023   Colonoscopy  07/01/2028   Pneumonia Vaccine 37+ Years old  Completed   Hepatitis C Screening  Completed   Zoster Vaccines- Shingrix  Completed   HPV VACCINES  Aged Out   COVID-19 Vaccine  Discontinued    Health Maintenance  Health Maintenance Due  Topic Date Due   DTaP/Tdap/Td (2 - Tdap) 05/06/2022    Colorectal cancer screening: Type of screening: Colonoscopy. Completed 07/01/18. Repeat every 10 years  Lung Cancer Screening: (Low Dose CT Chest recommended if Age 17-80 years, 20 pack-year currently smoking OR have quit w/in 15years.) does not qualify.    Additional Screening:  Hepatitis C Screening: does qualify; Completed 08/01/20  Vision Screening: Recommended annual ophthalmology exams for early detection of glaucoma and other disorders of the eye. Is the patient up to date with their annual eye exam?  Yes  Who is the provider or what is the name of the office in which the patient attends annual eye exams? Dr.Brasington If pt is not established with a provider, would they like to be referred to a provider to establish care? No .   Dental Screening: Recommended annual dental exams for proper oral hygiene    Community Resource Referral / Chronic Care Management: CRR required this visit?  No   CCM required this visit?  No     Plan:     I have personally reviewed and noted the following in the patient's chart:   Medical and social history Use of alcohol, tobacco or illicit drugs  Current medications and supplements including opioid prescriptions. Patient is not currently taking opioid prescriptions. Functional ability and status Nutritional status Physical activity Advanced directives List of other physicians Hospitalizations, surgeries, and ER visits in previous 12 months Vitals Screenings to include cognitive, depression, and falls Referrals and appointments  In addition, I have reviewed and  discussed with patient certain preventive protocols, quality metrics, and best practice recommendations. A written personalized care plan for preventive services as well as general preventive health recommendations were provided to patient.     [name], LPN   1/61/0960   After Visit Summary: (MyChart) Due to this being a telephonic visit, the after visit summary with patients personalized plan was offered to patient via MyChart   Nurse Notes: none

## 2022-11-29 NOTE — Patient Instructions (Addendum)
Jason Nolan , Thank you for taking time to come for your Medicare Wellness Visit. I appreciate your ongoing commitment to your health goals. Please review the following plan we discussed and let me know if I can assist you in the future.   Referrals/Orders/Follow-Ups/Clinician Recommendations: none  This is a list of the screening recommended for you and due dates:  Health Maintenance  Topic Date Due   DTaP/Tdap/Td vaccine (2 - Tdap) 05/06/2022   Flu Shot  12/05/2022   Medicare Annual Wellness Visit  11/29/2023   Colon Cancer Screening  07/01/2028   Pneumonia Vaccine  Completed   Hepatitis C Screening  Completed   Zoster (Shingles) Vaccine  Completed   HPV Vaccine  Aged Out   COVID-19 Vaccine  Discontinued    Advanced directives: (Declined) Advance directive discussed with you today. Even though you declined this today, please call our office should you change your mind, and we can give you the proper paperwork for you to fill out.  Next Medicare Annual Wellness Visit scheduled for next year: Yes  12/05/23 @ 9:45 am by phone  Preventive Care 65 Years and Older, Male  Preventive care refers to lifestyle choices and visits with your health care provider that can promote health and wellness. What does preventive care include? A yearly physical exam. This is also called an annual well check. Dental exams once or twice a year. Routine eye exams. Ask your health care provider how often you should have your eyes checked. Personal lifestyle choices, including: Daily care of your teeth and gums. Regular physical activity. Eating a healthy diet. Avoiding tobacco and drug use. Limiting alcohol use. Practicing safe sex. Taking low doses of aspirin every day. Taking vitamin and mineral supplements as recommended by your health care provider. What happens during an annual well check? The services and screenings done by your health care provider during your annual well check will depend on  your age, overall health, lifestyle risk factors, and family history of disease. Counseling  Your health care provider may ask you questions about your: Alcohol use. Tobacco use. Drug use. Emotional well-being. Home and relationship well-being. Sexual activity. Eating habits. History of falls. Memory and ability to understand (cognition). Work and work Astronomer. Screening  You may have the following tests or measurements: Height, weight, and BMI. Blood pressure. Lipid and cholesterol levels. These may be checked every 5 years, or more frequently if you are over 51 years old. Skin check. Lung cancer screening. You may have this screening every year starting at age 4 if you have a 30-pack-year history of smoking and currently smoke or have quit within the past 15 years. Fecal occult blood test (FOBT) of the stool. You may have this test every year starting at age 79. Flexible sigmoidoscopy or colonoscopy. You may have a sigmoidoscopy every 5 years or a colonoscopy every 10 years starting at age 28. Prostate cancer screening. Recommendations will vary depending on your family history and other risks. Hepatitis C blood test. Hepatitis B blood test. Sexually transmitted disease (STD) testing. Diabetes screening. This is done by checking your blood sugar (glucose) after you have not eaten for a while (fasting). You may have this done every 1-3 years. Abdominal aortic aneurysm (AAA) screening. You may need this if you are a current or former smoker. Osteoporosis. You may be screened starting at age 108 if you are at high risk. Talk with your health care provider about your test results, treatment options, and if necessary, the  need for more tests. Vaccines  Your health care provider may recommend certain vaccines, such as: Influenza vaccine. This is recommended every year. Tetanus, diphtheria, and acellular pertussis (Tdap, Td) vaccine. You may need a Td booster every 10 years. Zoster  vaccine. You may need this after age 60. Pneumococcal 13-valent conjugate (PCV13) vaccine. One dose is recommended after age 72. Pneumococcal polysaccharide (PPSV23) vaccine. One dose is recommended after age 59. Talk to your health care provider about which screenings and vaccines you need and how often you need them. This information is not intended to replace advice given to you by your health care provider. Make sure you discuss any questions you have with your health care provider. Document Released: 05/19/2015 Document Revised: 01/10/2016 Document Reviewed: 02/21/2015 Elsevier Interactive Patient Education  2017 [employer].  Fall Prevention in the Home Falls can cause injuries. They can happen to people of all ages. There are many things you can do to make your home safe and to help prevent falls. What can I do on the outside of my home? Regularly fix the edges of walkways and driveways and fix any cracks. Remove anything that might make you trip as you walk through a door, such as a raised step or threshold. Trim any bushes or trees on the path to your home. Use bright outdoor lighting. Clear any walking paths of anything that might make someone trip, such as rocks or tools. Regularly check to see if handrails are loose or broken. Make sure that both sides of any steps have handrails. Any raised decks and porches should have guardrails on the edges. Have any leaves, snow, or ice cleared regularly. Use sand or salt on walking paths during winter. Clean up any spills in your garage right away. This includes oil or grease spills. What can I do in the bathroom? Use night lights. Install grab bars by the toilet and in the tub and shower. Do not use towel bars as grab bars. Use non-skid mats or decals in the tub or shower. If you need to sit down in the shower, use a plastic, non-slip stool. Keep the floor dry. Clean up any water that spills on the floor as soon as it happens. Remove  soap buildup in the tub or shower regularly. Attach bath mats securely with double-sided non-slip rug tape. Do not have throw rugs and other things on the floor that can make you trip. What can I do in the bedroom? Use night lights. Make sure that you have a light by your bed that is easy to reach. Do not use any sheets or blankets that are too big for your bed. They should not hang down onto the floor. Have a firm chair that has side arms. You can use this for support while you get dressed. Do not have throw rugs and other things on the floor that can make you trip. What can I do in the kitchen? Clean up any spills right away. Avoid walking on wet floors. Keep items that you use a lot in easy-to-reach places. If you need to reach something above you, use a strong step stool that has a grab bar. Keep electrical cords out of the way. Do not use floor polish or wax that makes floors slippery. If you must use wax, use non-skid floor wax. Do not have throw rugs and other things on the floor that can make you trip. What can I do with my stairs? Do not leave any items on the  stairs. Make sure that there are handrails on both sides of the stairs and use them. Fix handrails that are broken or loose. Make sure that handrails are as long as the stairways. Check any carpeting to make sure that it is firmly attached to the stairs. Fix any carpet that is loose or worn. Avoid having throw rugs at the top or bottom of the stairs. If you do have throw rugs, attach them to the floor with carpet tape. Make sure that you have a light switch at the top of the stairs and the bottom of the stairs. If you do not have them, ask someone to add them for you. What else can I do to help prevent falls? Wear shoes that: Do not have high heels. Have rubber bottoms. Are comfortable and fit you well. Are closed at the toe. Do not wear sandals. If you use a stepladder: Make sure that it is fully opened. Do not climb a  closed stepladder. Make sure that both sides of the stepladder are locked into place. Ask someone to hold it for you, if possible. Clearly mark and make sure that you can see: Any grab bars or handrails. First and last steps. Where the edge of each step is. Use tools that help you move around (mobility aids) if they are needed. These include: Canes. Walkers. Scooters. Crutches. Turn on the lights when you go into a dark area. Replace any light bulbs as soon as they burn out. Set up your furniture so you have a clear path. Avoid moving your furniture around. If any of your floors are uneven, fix them. If there are any pets around you, be aware of where they are. Review your medicines with your doctor. Some medicines can make you feel dizzy. This can increase your chance of falling. Ask your doctor what other things that you can do to help prevent falls. This information is not intended to replace advice given to you by your health care provider. Make sure you discuss any questions you have with your health care provider. Document Released: 02/16/2009 Document Revised: 09/28/2015 Document Reviewed: 05/27/2014 Elsevier Interactive Patient Education  2017 [employer].

## 2023-01-11 ENCOUNTER — Other Ambulatory Visit: Payer: Self-pay | Admitting: Internal Medicine

## 2023-01-11 DIAGNOSIS — I10 Essential (primary) hypertension: Secondary | ICD-10-CM

## 2023-01-13 NOTE — Telephone Encounter (Signed)
Requested Prescriptions  Pending Prescriptions Disp Refills   amLODipine (NORVASC) 10 MG tablet [Pharmacy Med Name: AMLODIPINE BESYLATE 10 MG TAB] 90 tablet 0    Sig: TAKE 1 TABLET BY MOUTH EVERY DAY     Cardiovascular: Calcium Channel Blockers 2 Passed - 01/11/2023  9:26 AM      Passed - Last BP in normal range    BP Readings from Last 1 Encounters:  09/17/22 108/64         Passed - Last Heart Rate in normal range    Pulse Readings from Last 1 Encounters:  09/17/22 (!) 55         Passed - Valid encounter within last 6 months    Recent Outpatient Visits           3 months ago Encounter for general adult medical examination with abnormal findings   [place] [facility] [place], Salvadore Oxford, NP   10 months ago Pure hypercholesterolemia   [place] [facility] [place], Salvadore Oxford, NP   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

## 2023-01-24 ENCOUNTER — Other Ambulatory Visit: Payer: Self-pay | Admitting: Internal Medicine

## 2023-01-24 DIAGNOSIS — I639 Cerebral infarction, unspecified: Secondary | ICD-10-CM

## 2023-01-27 NOTE — Telephone Encounter (Signed)
Requested Prescriptions  Pending Prescriptions Disp Refills   atorvastatin (LIPITOR) 40 MG tablet [Pharmacy Med Name: ATORVASTATIN 40 MG TABLET] 90 tablet 2    Sig: TAKE 1 TABLET BY MOUTH EVERY DAY AT 6PM     Cardiovascular:  Antilipid - Statins Failed - 01/24/2023  1:39 AM      Failed - Lipid Panel in normal range within the last 12 months    Cholesterol  Date Value Ref Range Status  09/17/2022 118 <200 mg/dL Final   LDL Cholesterol (Calc)  Date Value Ref Range Status  09/17/2022 39 mg/dL (calc) Final    Comment:    Reference range: <100 . Desirable range <100 mg/dL for primary prevention;   <70 mg/dL for patients with CHD or diabetic patients  with > or = 2 CHD risk factors. Marland Kitchen LDL-C is now calculated using the Martin-Hopkins  calculation, which is a validated novel method providing  better accuracy than the Friedewald equation in the  estimation of LDL-C.  Horald Pollen et al. Lenox Ahr. 8295;621(30): 2061-2068  (http://education.[employer].com/faq/FAQ164)    HDL  Date Value Ref Range Status  09/17/2022 63 > OR = 40 mg/dL Final   Triglycerides  Date Value Ref Range Status  09/17/2022 75 <150 mg/dL Final         Passed - Patient is not pregnant      Passed - Valid encounter within last 12 months    Recent Outpatient Visits           4 months ago Encounter for general adult medical examination with abnormal findings   [place] [facility] [place], Salvadore Oxford, NP   10 months ago Pure hypercholesterolemia   [place] [facility] [place], Salvadore Oxford, NP   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

## 2023-03-24 ENCOUNTER — Ambulatory Visit
Admission: RE | Admit: 2023-03-24 | Discharge: 2023-03-24 | Disposition: A | Discharge status: Home / Self Care | Payer: Medicare HMO | Source: Ambulatory Visit | Attending: Internal Medicine | Admitting: Internal Medicine

## 2023-03-24 ENCOUNTER — Ambulatory Visit
Admission: RE | Admit: 2023-03-24 | Discharge: 2023-03-24 | Disposition: A | Discharge status: Home / Self Care | Payer: Medicare HMO | Attending: Internal Medicine | Admitting: Internal Medicine

## 2023-03-24 ENCOUNTER — Encounter: Payer: Self-pay | Admitting: Internal Medicine

## 2023-03-24 ENCOUNTER — Ambulatory Visit (INDEPENDENT_AMBULATORY_CARE_PROVIDER_SITE_OTHER): Payer: Medicare HMO | Admitting: Internal Medicine

## 2023-03-24 VITALS — BP 134/84 | HR 57 | Ht 70.0 in | Wt 207.4 lb

## 2023-03-24 DIAGNOSIS — I10 Essential (primary) hypertension: Secondary | ICD-10-CM | POA: Diagnosis not present

## 2023-03-24 DIAGNOSIS — E7800 Pure hypercholesterolemia, unspecified: Secondary | ICD-10-CM | POA: Diagnosis not present

## 2023-03-24 DIAGNOSIS — Z6829 Body mass index (BMI) 29.0-29.9, adult: Secondary | ICD-10-CM

## 2023-03-24 DIAGNOSIS — D700 Congenital agranulocytosis: Secondary | ICD-10-CM

## 2023-03-24 DIAGNOSIS — G8194 Hemiplegia, unspecified affecting left nondominant side: Secondary | ICD-10-CM

## 2023-03-24 DIAGNOSIS — H6121 Impacted cerumen, right ear: Secondary | ICD-10-CM

## 2023-03-24 DIAGNOSIS — Z23 Encounter for immunization: Secondary | ICD-10-CM

## 2023-03-24 DIAGNOSIS — G8929 Other chronic pain: Secondary | ICD-10-CM | POA: Diagnosis present

## 2023-03-24 DIAGNOSIS — E663 Overweight: Secondary | ICD-10-CM

## 2023-03-24 DIAGNOSIS — Z8673 Personal history of transient ischemic attack (TIA), and cerebral infarction without residual deficits: Secondary | ICD-10-CM

## 2023-03-24 DIAGNOSIS — R7303 Prediabetes: Secondary | ICD-10-CM | POA: Diagnosis not present

## 2023-03-24 DIAGNOSIS — M25512 Pain in left shoulder: Secondary | ICD-10-CM | POA: Diagnosis present

## 2023-03-24 NOTE — Assessment & Plan Note (Signed)
Encourage diet and exercise for weight loss 

## 2023-03-24 NOTE — Assessment & Plan Note (Signed)
Stable Discussed the importance of good blood pressure, cholesterol and blood sugar control Continue aspirin

## 2023-03-24 NOTE — Assessment & Plan Note (Signed)
C-Met and lipid profile today Encouraged him to get him a low-fat diet Continue atorvastatin

## 2023-03-24 NOTE — Assessment & Plan Note (Addendum)
Stable Discussed the importance of good blood pressure, cholesterol and blood sugar control Continue aspirin

## 2023-03-24 NOTE — Progress Notes (Signed)
Subjective:    Patient ID: Jason Nolan, male    DOB: 1946-02-23, 77 y.o.   MRN: [id number]  HPI  Patient presents to clinic today for 45-month follow-up of chronic conditions.  HTN: His BP today is 134/84.  He is taking amlodipine as prescribed.  ECG from 11/2016 reviewed.  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  His last LDL was 39, triglycerides 75, 09/2022.  He denies myalgias on atorvastatin.  He is taking aspirin as well.  He does not follow with neurology.  Neutropenia: His last WBC count was 2.37, 09/2022.  He has seen hematology in the past.  Prediabetes: His last A1c was 5.8%, 09/2022.  He is not taking any oral diabetic medication at this time.  He does not check his sugars.  He also reports left shoulder pain. He reports this is a chronic issue that worsened in the last several months. He describes the pain as achy and worse with lifting his arm above his head. He denies numbness or tingling in the left upper extremity but does have chronic weakness from his prior stroke. He has not tried anything OTC for this. There is no imaging on file.  Review of Systems     Past Medical History:  Diagnosis Date   Glaucoma    Hepatitis C    Stroke [facility])     Current Outpatient Medications  Medication Sig Dispense Refill   amLODipine (NORVASC) 10 MG tablet TAKE 1 TABLET BY MOUTH EVERY DAY 90 tablet 0   aspirin 81 MG chewable tablet Chew 1 tablet (81 mg total) by mouth daily.     atorvastatin (LIPITOR) 40 MG tablet TAKE 1 TABLET BY MOUTH EVERY DAY AT 6PM 90 tablet 2   dorzolamide-timolol (COSOPT) 22.3-6.8 MG/ML ophthalmic solution 1 drop 2 (two) times daily.     RHOPRESSA 0.02 % SOLN Apply to eye at bedtime.     VYZULTA 0.024 % SOLN Apply 1 drop to eye at bedtime.      No current facility-administered medications for this visit.    No Known Allergies  Family History  Problem Relation Age of Onset   Hypertension Mother    Hypertension Father    Colon cancer  Neg Hx    Prostate cancer Neg Hx     Social History   Socioeconomic History   Marital status: Married    Spouse name: Not on file   Number of children: Not on file   Years of education: Not on file   Highest education level: Some college, no degree  Occupational History   Not on file  Tobacco Use   Smoking status: Former    Current packs/day: 0.00    Average packs/day: 0.5 packs/day for 15.0 years (7.5 ttl pk-yrs)    Types: Cigarettes    Start date: 45    Quit date: 14    Years since quitting: 34.9   Smokeless tobacco: Never  Vaping Use   Vaping status: Never Used  Substance and Sexual Activity   Alcohol use: No    Alcohol/week: 0.0 standard drinks of alcohol   Drug use: No   Sexual activity: Yes    Partners: Female    Birth control/protection: None  Other Topics Concern   Not on file  Social History Narrative   Not on file   Social Determinants of Health   Financial Resource Strain: Low Risk  (03/20/2023)   Overall Financial Resource Strain (CARDIA)    Difficulty of Paying Living Expenses:  Not hard at all  Food Insecurity: No Food Insecurity (03/20/2023)   Hunger Vital Sign    Worried About Running Out of Food in the Last Year: Never true    Ran Out of Food in the Last Year: Never true  Transportation Needs: No Transportation Needs (03/20/2023)   PRAPARE - Administrator, Civil Service (Medical): No    Lack of Transportation (Non-Medical): No  Physical Activity: Sufficiently Active (03/20/2023)   Exercise Vital Sign    Days of Exercise per Week: 3 days    Minutes of Exercise per Session: 120 min  Stress: No Stress Concern Present (03/20/2023)   [employer] of Occupational Health - Occupational Stress Questionnaire    Feeling of Stress : Only a little  Social Connections: Socially Integrated (03/20/2023)   Social Connection and Isolation Panel [NHANES]    Frequency of Communication with Friends and Family: More than three times a week     Frequency of Social Gatherings with Friends and Family: Three times a week    Attends Religious Services: 1 to 4 times per year    Active Member of Clubs or Organizations: No    Attends Engineer, structural: More than 4 times per year    Marital Status: Married  Catering manager Violence: Not At Risk (11/29/2022)   Humiliation, Afraid, Rape, and Kick questionnaire    Fear of Current or Ex-Partner: No    Emotionally Abused: No    Physically Abused: No    Sexually Abused: No     Constitutional: Denies fever, malaise, fatigue, headache or abrupt weight changes.  HEENT: Patient reports wax buildup, eye redness.  Denies eye pain, ear pain, ringing in the ears, runny nose, nasal congestion, bloody nose, or sore throat. Respiratory: Denies difficulty breathing, shortness of breath, cough or sputum production.   Cardiovascular: Patient reports intermittent swelling in left lower extremity.  Denies chest pain, chest tightness, palpitations or swelling in the hands.  Gastrointestinal: Denies abdominal pain, bloating, constipation, diarrhea or blood in the stool.  GU: Denies urgency, frequency, pain with urination, burning sensation, blood in urine, odor or discharge. Musculoskeletal: Pt reports left shoulder pain. Denies decrease in range of motion, difficulty with gait, muscle pain or joint pain and swelling.  Skin: Denies redness, rashes, lesions or ulcercations.  Neurological: Denies dizziness, difficulty with memory, difficulty with speech or problems with balance and coordination.  Psych: Denies anxiety, depression, SI/HI.  No other specific complaints in a complete review of systems (except as listed in HPI above).  Objective:   Physical Exam  BP 134/84   Pulse (!) 57   Ht 5\' 10"  (1.778 m)   Wt 207 lb 6.4 oz (94.1 kg)   SpO2 100%   BMI 29.76 kg/m    Wt Readings from Last 3 Encounters:  11/29/22 209 lb (94.8 kg)  09/17/22 209 lb (94.8 kg)  03/19/22 205 lb (93 kg)     General: Appears his stated age, overweight, in NAD. Skin: Warm, dry and intact.  HEENT: Head: normal shape and size; Eyes: sclera injected, no icterus, conjunctiva pink, PERRLA and EOMs intact; Ears: Cerumen impaction right ear;  Cardiovascular: Bradycardic with normal rhythm. S1,S2 noted.  No murmur, rubs or gallops noted. No JVD or BLE edema. No carotid bruits noted. Pulmonary/Chest: Normal effort and positive vesicular breath sounds. No respiratory distress. No wheezes, rales or ronchi noted.  Musculoskeletal: Normal internal and external rotation of left shoulder.  Negative drop can test on the left.  No pain with palpation of the left shoulder.  Strength 5/5 RUE, 4/5 LUE.  Handgrips equal.  No difficulty with gait.  Neurological: Alert and oriented.  oordination normal.     BMET    Component Value Date/Time   NA 140 09/17/2022 1003   K 4.3 09/17/2022 1003   CL 107 09/17/2022 1003   CO2 26 09/17/2022 1003   GLUCOSE 100 (H) 09/17/2022 1003   BUN 9 09/17/2022 1003   CREATININE 0.92 09/17/2022 1003   CALCIUM 9.2 09/17/2022 1003   GFRNONAA >60 12/19/2016 0406   GFRAA >60 12/19/2016 0406    Lipid Panel     Component Value Date/Time   CHOL 118 09/17/2022 1003   TRIG 75 09/17/2022 1003   HDL 63 09/17/2022 1003   CHOLHDL 1.9 09/17/2022 1003   VLDL 17.2 08/01/2020 1229   LDLCALC 39 09/17/2022 1003    CBC    Component Value Date/Time   WBC 2.3 (L) 09/17/2022 1003   RBC 4.93 09/17/2022 1003   HGB 15.1 09/17/2022 1003   HCT 45.7 09/17/2022 1003   PLT 182 09/17/2022 1003   MCV 92.7 09/17/2022 1003   MCH 30.6 09/17/2022 1003   MCHC 33.0 09/17/2022 1003   RDW 12.7 09/17/2022 1003   LYMPHSABS 1,158 09/12/2021 1007   MONOABS 0.2 06/20/2017 1605   EOSABS 122 09/12/2021 1007   BASOSABS 22 09/12/2021 1007    Hgb A1C Lab Results  Component Value Date   HGBA1C 5.8 (H) 09/17/2022            Assessment & Plan:   Cerumen impaction right ear:  Manual lavage by  CMA Advised him to try Debrox 2 times weekly to prevent wax buildup   RTC in 6 months, for your annual exam Nicki Reaper, NP

## 2023-03-24 NOTE — Assessment & Plan Note (Signed)
He declines Rx for anti-inflammatories or steroids at this time X-ray left shoulder for further evaluation

## 2023-03-24 NOTE — Patient Instructions (Signed)

## 2023-03-24 NOTE — Assessment & Plan Note (Signed)
CBC today.  

## 2023-03-24 NOTE — Assessment & Plan Note (Signed)
Controlled on amlodipine Reinforced DASH diet and exercise for weight loss C-Met today 

## 2023-03-29 LAB — CBC
HCT: 46.6 % (ref 38.5–50.0)
Hemoglobin: 15.4 g/dL (ref 13.2–17.1)
MCH: 30.3 pg (ref 27.0–33.0)
MCHC: 33 g/dL (ref 32.0–36.0)
MCV: 91.7 fL (ref 80.0–100.0)
MPV: 10.8 fL (ref 7.5–12.5)
Platelets: 182 10*3/uL (ref 140–400)
RBC: 5.08 10*6/uL (ref 4.20–5.80)
RDW: 12.8 % (ref 11.0–15.0)
WBC: 2.3 10*3/uL — ABNORMAL LOW (ref 3.8–10.8)

## 2023-03-29 LAB — COMPLETE METABOLIC PANEL WITH GFR
AG Ratio: 1.6 (calc) (ref 1.0–2.5)
ALT: 23 U/L (ref 9–46)
AST: 21 U/L (ref 10–35)
Albumin: 4.2 g/dL (ref 3.6–5.1)
Alkaline phosphatase (APISO): 61 U/L (ref 35–144)
BUN: 15 mg/dL (ref 7–25)
CO2: 25 mmol/L (ref 20–32)
Calcium: 9.1 mg/dL (ref 8.6–10.3)
Chloride: 106 mmol/L (ref 98–110)
Creat: 0.95 mg/dL (ref 0.70–1.28)
Globulin: 2.7 g/dL (ref 1.9–3.7)
Glucose, Bld: 90 mg/dL (ref 65–99)
Potassium: 4 mmol/L (ref 3.5–5.3)
Sodium: 138 mmol/L (ref 135–146)
Total Bilirubin: 0.9 mg/dL (ref 0.2–1.2)
Total Protein: 6.9 g/dL (ref 6.1–8.1)
eGFR: 83 mL/min/{1.73_m2} (ref >=60)

## 2023-03-29 LAB — LIPID PANEL
Cholesterol: 116 mg/dL (ref <200)
HDL: 61 mg/dL (ref >=40)
LDL Cholesterol (Calc): 44 mg/dL
Non-HDL Cholesterol (Calc): 55 mg/dL (ref <130)
Total CHOL/HDL Ratio: 1.9 (calc) (ref <5.0)
Triglycerides: 38 mg/dL (ref <150)

## 2023-03-29 LAB — HEMOGLOBIN A1C
Hgb A1c MFr Bld: 5.7 %{Hb} — ABNORMAL HIGH (ref <5.7)
Mean Plasma Glucose: 117 mg/dL
eAG (mmol/L): 6.5 mmol/L

## 2023-04-09 ENCOUNTER — Other Ambulatory Visit: Payer: Self-pay | Admitting: Internal Medicine

## 2023-04-09 DIAGNOSIS — I10 Essential (primary) hypertension: Secondary | ICD-10-CM

## 2023-04-11 NOTE — Telephone Encounter (Signed)
Requested Prescriptions  Pending Prescriptions Disp Refills   amLODipine (NORVASC) 10 MG tablet [Pharmacy Med Name: AMLODIPINE BESYLATE 10 MG TAB] 90 tablet 1    Sig: TAKE 1 TABLET BY MOUTH EVERY DAY     Cardiovascular: Calcium Channel Blockers 2 Passed - 04/09/2023 10:33 AM      Passed - Last BP in normal range    BP Readings from Last 1 Encounters:  03/24/23 134/84         Passed - Last Heart Rate in normal range    Pulse Readings from Last 1 Encounters:  03/24/23 (!) 57         Passed - Valid encounter within last 6 months    Recent Outpatient Visits           2 weeks ago Pure hypercholesterolemia   [facility] [place], Salvadore Oxford, NP   6 months ago Encounter for general adult medical examination with abnormal findings   [facility] [place], Salvadore Oxford, NP   1 year ago Pure hypercholesterolemia   [place] [facility], Salvadore Oxford, NP   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, [facility]

## 2023-07-20 ENCOUNTER — Encounter: Payer: Self-pay | Admitting: Internal Medicine

## 2023-07-22 ENCOUNTER — Encounter: Payer: Self-pay | Admitting: Internal Medicine

## 2023-07-22 ENCOUNTER — Telehealth (INDEPENDENT_AMBULATORY_CARE_PROVIDER_SITE_OTHER): Admitting: Internal Medicine

## 2023-07-22 DIAGNOSIS — B9789 Other viral agents as the cause of diseases classified elsewhere: Secondary | ICD-10-CM

## 2023-07-22 DIAGNOSIS — R61 Generalized hyperhidrosis: Secondary | ICD-10-CM

## 2023-07-22 DIAGNOSIS — R6883 Chills (without fever): Secondary | ICD-10-CM | POA: Diagnosis not present

## 2023-07-22 DIAGNOSIS — R197 Diarrhea, unspecified: Secondary | ICD-10-CM

## 2023-07-22 DIAGNOSIS — J069 Acute upper respiratory infection, unspecified: Secondary | ICD-10-CM | POA: Diagnosis not present

## 2023-07-22 NOTE — Progress Notes (Signed)
 Virtual Visit via Video Note  I connected with Jason Nolan on 07/22/23 at 11:20 AM EDT by a video enabled telemedicine application and verified that I am speaking with the correct person using two identifiers.  Location: Patient: Home Provider: Office  Person's participating in this video call: Nicki Reaper, NP-C and Ajmal Kathan   I discussed the limitations of evaluation and management by telemedicine and the availability of in person appointments. The patient expressed understanding and agreed to proceed.  History of Present Illness:   Discussed the use of AI scribe software for clinical note transcription with the patient, who gave verbal consent to proceed.  Jason Nolan is a 78 year old male who presents with night sweats and recent viral symptoms.  He has been experiencing symptoms for the past five days, initially resembling a cold, with mild sinus headache, occasional rhinorrhea when eating, slight otalgia, and a non-persistent cough. No pharyngitis or dyspnea. Chills accompanied the onset of night sweats.  Night sweats began after the onset of upper respiratory symptoms and diarrhea. He wakes up 'soaking wet' at night. He has not checked his temperature but denies any significant fever.  Diarrhea was present initially but has since resolved. No nausea or vomiting during the episode of diarrhea.  He has not taken any regular medication for these symptoms but did take a dose of cough medicine with DM, such as Robitussin DM or Mucinex DM, one night. He recalls feeling particularly unwell last Friday, which prevented him from going out as planned.      Past Medical History:  Diagnosis Date   Glaucoma    Hepatitis C    Stroke [facility])     Current Outpatient Medications  Medication Sig Dispense Refill   amLODipine (NORVASC) 10 MG tablet TAKE 1 TABLET BY MOUTH EVERY DAY 90 tablet 1   aspirin 81 MG chewable tablet Chew 1 tablet (81 mg total) by mouth daily.     atorvastatin  (LIPITOR) 40 MG tablet TAKE 1 TABLET BY MOUTH EVERY DAY AT 6PM 90 tablet 2   brimonidine (ALPHAGAN) 0.2 % ophthalmic solution 1 drop 2 (two) times daily.     RHOPRESSA 0.02 % SOLN Apply to eye at bedtime.     VYZULTA 0.024 % SOLN Apply 1 drop to eye at bedtime.      No current facility-administered medications for this visit.    No Known Allergies  Family History  Problem Relation Age of Onset   Hypertension Mother    Hypertension Father    Colon cancer Neg Hx    Prostate cancer Neg Hx     Social History   Socioeconomic History   Marital status: Married    Spouse name: Not on file   Number of children: Not on file   Years of education: Not on file   Highest education level: Some college, no degree  Occupational History   Not on file  Tobacco Use   Smoking status: Former    Current packs/day: 0.00    Average packs/day: 0.5 packs/day for 15.0 years (7.5 ttl pk-yrs)    Types: Cigarettes    Start date: 89    Quit date: 64    Years since quitting: 35.2   Smokeless tobacco: Never  Vaping Use   Vaping status: Never Used  Substance and Sexual Activity   Alcohol use: No    Alcohol/week: 0.0 standard drinks of alcohol   Drug use: No   Sexual activity: Yes    Partners: Female  Birth control/protection: None  Other Topics Concern   Not on file  Social History Narrative   Not on file   Social Drivers of Health   Financial Resource Strain: Low Risk  (07/21/2023)   Overall Financial Resource Strain (CARDIA)    Difficulty of Paying Living Expenses: Not hard at all  Food Insecurity: No Food Insecurity (07/21/2023)   Hunger Vital Sign    Worried About Running Out of Food in the Last Year: Never true    Ran Out of Food in the Last Year: Never true  Transportation Needs: No Transportation Needs (07/21/2023)   PRAPARE - Administrator, Civil Service (Medical): No    Lack of Transportation (Non-Medical): No  Physical Activity: Sufficiently Active (03/20/2023)    Exercise Vital Sign    Days of Exercise per Week: 3 days    Minutes of Exercise per Session: 120 min  Stress: No Stress Concern Present (03/20/2023)   [employer] of Occupational Health - Occupational Stress Questionnaire    Feeling of Stress : Only a little  Social Connections: Socially Integrated (03/20/2023)   Social Connection and Isolation Panel [NHANES]    Frequency of Communication with Friends and Family: More than three times a week    Frequency of Social Gatherings with Friends and Family: Three times a week    Attends Religious Services: 1 to 4 times per year    Active Member of Clubs or Organizations: No    Attends Engineer, structural: More than 4 times per year    Marital Status: Married  Catering manager Violence: Not At Risk (11/29/2022)   Humiliation, Afraid, Rape, and Kick questionnaire    Fear of Current or Ex-Partner: No    Emotionally Abused: No    Physically Abused: No    Sexually Abused: No     Constitutional: Pt reports chills. Denies fever, malaise, fatigue, headache or abrupt weight changes.  HEENT: Pt reports sinus pressure, runny nose, ear pain. Denies eye pain, eye redness, ringing in the ears, wax buildup, runny nose, nasal congestion, bloody nose, or sore throat. Respiratory: Pt reports cough. Denies difficulty breathing, shortness of breath, or sputum production.   Cardiovascular: Denies chest pain, chest tightness, palpitations or swelling in the hands or feet.  Gastrointestinal: Patient reports diarrhea.  Denies abdominal pain, bloating, constipation, or blood in the stool.  Musculoskeletal: Denies decrease in range of motion, difficulty with gait, muscle pain or joint pain and swelling.  Skin: Denies redness, rashes, lesions or ulcercations.  Neurological: Patient reports night sweats.  Denies dizziness, difficulty with memory, difficulty with speech or problems with balance and coordination.    No other specific complaints in a  complete review of systems (except as listed in HPI above).  Observations/Objective:   General: Appears his stated age, well developed, well nourished in NAD. HEENT: Head: normal shape and size; Nose: No congestion noted; Throat/Mouth: Slight hoarseness noted.  Pulmonary/Chest: Normal effort. No respiratory distress.   Abdomen: Diarrhea resolved per his report Neurological: Alert and oriented.   BMET    Component Value Date/Time   NA 138 03/28/2023 0921   K 4.0 03/28/2023 0921   CL 106 03/28/2023 0921   CO2 25 03/28/2023 0921   GLUCOSE 90 03/28/2023 0921   BUN 15 03/28/2023 0921   CREATININE 0.95 03/28/2023 0921   CALCIUM 9.1 03/28/2023 0921   GFRNONAA >60 12/19/2016 0406   GFRAA >60 12/19/2016 0406    Lipid Panel     Component Value  Date/Time   CHOL 116 03/28/2023 0921   TRIG 38 03/28/2023 0921   HDL 61 03/28/2023 0921   CHOLHDL 1.9 03/28/2023 0921   VLDL 17.2 08/01/2020 1229   LDLCALC 44 03/28/2023 0921    CBC    Component Value Date/Time   WBC 2.3 (L) 03/28/2023 0921   RBC 5.08 03/28/2023 0921   HGB 15.4 03/28/2023 0921   HCT 46.6 03/28/2023 0921   PLT 182 03/28/2023 0921   MCV 91.7 03/28/2023 0921   MCH 30.3 03/28/2023 0921   MCHC 33.0 03/28/2023 0921   RDW 12.8 03/28/2023 0921   LYMPHSABS 1,158 09/12/2021 1007   MONOABS 0.2 06/20/2017 1605   EOSABS 122 09/12/2021 1007   BASOSABS 22 09/12/2021 1007    Hgb A1C Lab Results  Component Value Date   HGBA1C 5.7 (H) 03/28/2023       Assessment and Plan:  Assessment and Plan    Viral Upper Respiratory Infection, Diarrhea, Chills, Night Sweats Symptoms suggest viral etiology, likely COVID-19 or influenza. Testing unnecessary as antiviral treatment is not viable. Night sweats likely due to low-grade fever. Reassured resolution expected within a week.  - Advise acetaminophen or ibuprofen at night for fever and night sweats. - Monitor symptoms and update by end of week. - Contact if symptoms worsen  before Friday.      RTC in 2 months for your annual exam  Follow Up Instructions:    I discussed the assessment and treatment plan with the patient. The patient was provided an opportunity to ask questions and all were answered. The patient agreed with the plan and demonstrated an understanding of the instructions.   The patient was advised to call back or seek an in-person evaluation if the symptoms worsen or if the condition fails to improve as anticipated.   Nicki Reaper, NP

## 2023-09-19 ENCOUNTER — Encounter: Payer: Medicare HMO | Admitting: Internal Medicine

## 2023-10-02 ENCOUNTER — Ambulatory Visit (INDEPENDENT_AMBULATORY_CARE_PROVIDER_SITE_OTHER): Admitting: Internal Medicine

## 2023-10-02 ENCOUNTER — Encounter: Payer: Self-pay | Admitting: Internal Medicine

## 2023-10-02 VITALS — BP 120/76 | HR 58 | Resp 18 | Wt 208.7 lb

## 2023-10-02 DIAGNOSIS — Z6829 Body mass index (BMI) 29.0-29.9, adult: Secondary | ICD-10-CM

## 2023-10-02 DIAGNOSIS — Z125 Encounter for screening for malignant neoplasm of prostate: Secondary | ICD-10-CM | POA: Diagnosis not present

## 2023-10-02 DIAGNOSIS — E663 Overweight: Secondary | ICD-10-CM

## 2023-10-02 DIAGNOSIS — R739 Hyperglycemia, unspecified: Secondary | ICD-10-CM | POA: Diagnosis not present

## 2023-10-02 DIAGNOSIS — E7800 Pure hypercholesterolemia, unspecified: Secondary | ICD-10-CM | POA: Diagnosis not present

## 2023-10-02 DIAGNOSIS — Z0001 Encounter for general adult medical examination with abnormal findings: Secondary | ICD-10-CM

## 2023-10-02 MED ORDER — SCOPOLAMINE 1 MG/3DAYS TD PT72: 1.0000 | MEDICATED_PATCH | TRANSDERMAL | 0 refills | Status: DC

## 2023-10-02 NOTE — Assessment & Plan Note (Signed)
 Encourage diet and exercise for weight loss

## 2023-10-02 NOTE — Patient Instructions (Signed)
 Health Maintenance, Male  Adopting a healthy lifestyle and getting preventive care are important in promoting health and wellness. Ask your health care provider about:  The right schedule for you to have regular tests and exams.  Things you can do on your own to prevent diseases and keep yourself healthy.  What should I know about diet, weight, and exercise?  Eat a healthy diet    Eat a diet that includes plenty of vegetables, fruits, low-fat dairy products, and lean protein.  Do not eat a lot of foods that are high in solid fats, added sugars, or sodium.  Maintain a healthy weight  Body mass index (BMI) is a measurement that can be used to identify possible weight problems. It estimates body fat based on height and weight. Your health care provider can help determine your BMI and help you achieve or maintain a healthy weight.  Get regular exercise  Get regular exercise. This is one of the most important things you can do for your health. Most adults should:  Exercise for at least 150 minutes each week. The exercise should increase your heart rate and make you sweat (moderate-intensity exercise).  Do strengthening exercises at least twice a week. This is in addition to the moderate-intensity exercise.  Spend less time sitting. Even light physical activity can be beneficial.  Watch cholesterol and blood lipids  Have your blood tested for lipids and cholesterol at 76 years of age, then have this test every 5 years.  You may need to have your cholesterol levels checked more often if:  Your lipid or cholesterol levels are high.  You are older than 76 years of age.  You are at high risk for heart disease.  What should I know about cancer screening?  Many types of cancers can be detected early and may often be prevented. Depending on your health history and family history, you may need to have cancer screening at various ages. This may include screening for:  Colorectal cancer.  Prostate cancer.  Skin cancer.  Lung  cancer.  What should I know about heart disease, diabetes, and high blood pressure?  Blood pressure and heart disease  High blood pressure causes heart disease and increases the risk of stroke. This is more likely to develop in people who have high blood pressure readings or are overweight.  Talk with your health care provider about your target blood pressure readings.  Have your blood pressure checked:  Every 3-5 years if you are 9-95 years of age.  Every year if you are 85 years old or older.  If you are between the ages of 29 and 29 and are a current or former smoker, ask your health care provider if you should have a one-time screening for abdominal aortic aneurysm (AAA).  Diabetes  Have regular diabetes screenings. This checks your fasting blood sugar level. Have the screening done:  Once every three years after age 23 if you are at a normal weight and have a low risk for diabetes.  More often and at a younger age if you are overweight or have a high risk for diabetes.  What should I know about preventing infection?  Hepatitis B  If you have a higher risk for hepatitis B, you should be screened for this virus. Talk with your health care provider to find out if you are at risk for hepatitis B infection.  Hepatitis C  Blood testing is recommended for:  Everyone born from [birth year] through 1963.  Anyone  with known risk factors for hepatitis C.  Sexually transmitted infections (STIs)  You should be screened each year for STIs, including gonorrhea and chlamydia, if:  You are sexually active and are younger than 76 years of age.  You are older than 76 years of age and your health care provider tells you that you are at risk for this type of infection.  Your sexual activity has changed since you were last screened, and you are at increased risk for chlamydia or gonorrhea. Ask your health care provider if you are at risk.  Ask your health care provider about whether you are at high risk for HIV. Your health care provider  may recommend a prescription medicine to help prevent HIV infection. If you choose to take medicine to prevent HIV, you should first get tested for HIV. You should then be tested every 3 months for as long as you are taking the medicine.  Follow these instructions at home:  Alcohol use  Do not drink alcohol if your health care provider tells you not to drink.  If you drink alcohol:  Limit how much you have to 0-2 drinks a day.  Know how much alcohol is in your drink. In the [place] one drink equals one 12 oz bottle of beer (355 mL), one 5 oz glass of wine (148 mL), or one 1 oz glass of hard liquor (44 mL).  Lifestyle  Do not use any products that contain nicotine or tobacco. These products include cigarettes, chewing tobacco, and vaping devices, such as e-cigarettes. If you need help quitting, ask your health care provider.  Do not use street drugs.  Do not share needles.  Ask your health care provider for help if you need support or information about quitting drugs.  General instructions  Schedule regular health, dental, and eye exams.  Stay current with your vaccines.  Tell your health care provider if:  You often feel depressed.  You have ever been abused or do not feel safe at home.  Summary  Adopting a healthy lifestyle and getting preventive care are important in promoting health and wellness.  Follow your health care provider's instructions about healthy diet, exercising, and getting tested or screened for diseases.  Follow your health care provider's instructions on monitoring your cholesterol and blood pressure.  This information is not intended to replace advice given to you by your health care provider. Make sure you discuss any questions you have with your health care provider.  Document Revised: 09/11/2020 Document Reviewed: 09/11/2020  Elsevier Patient Education  2024 [employer].

## 2023-10-02 NOTE — Progress Notes (Signed)
 Subjective:    Patient ID: Jason Nolan, male    DOB: [birth year], 78 y.o.   MRN: [id number]  HPI  Patient presents to clinic today for his annual exam.  Flu: 02/2023 Tetanus: 05/2012 COVID: Pfizer x 5 Pneumovax: 10/2014 Prevnar 20: 03/2023 Zostavax: 02/2014 Shingrix: 09/2016, 12/2016 PSA screening: 09/2022 Colon screening: 06/2018 Vision screening: annually Dentist: biannually  Diet: He does eat meat. He consumes fruits and veggies. He tries to avoid fried foods. He drinks mostly water, tea. Exercise: Golfing  Review of Systems     Past Medical History:  Diagnosis Date   Glaucoma    Hepatitis C    Stroke [facility])     Current Outpatient Medications  Medication Sig Dispense Refill   amLODipine  (NORVASC ) 10 MG tablet TAKE 1 TABLET BY MOUTH EVERY DAY 90 tablet 1   aspirin  81 MG chewable tablet Chew 1 tablet (81 mg total) by mouth daily.     atorvastatin  (LIPITOR) 40 MG tablet TAKE 1 TABLET BY MOUTH EVERY DAY AT 6PM 90 tablet 2   brimonidine (ALPHAGAN) 0.2 % ophthalmic solution 1 drop 2 (two) times daily.     RHOPRESSA  0.02 % SOLN Apply to eye at bedtime.     VYZULTA  0.024 % SOLN Apply 1 drop to eye at bedtime.      No current facility-administered medications for this visit.    No Known Allergies  Family History  Problem Relation Age of Onset   Hypertension Mother    Hypertension Father    Colon cancer Neg Hx    Prostate cancer Neg Hx     Social History   Socioeconomic History   Marital status: Married    Spouse name: Not on file   Number of children: Not on file   Years of education: Not on file   Highest education level: Some college, no degree  Occupational History   Not on file  Tobacco Use   Smoking status: Former    Current packs/day: 0.00    Average packs/day: 0.5 packs/day for 15.0 years (7.5 ttl pk-yrs)    Types: Cigarettes    Start date: 44    Quit date: 49    Years since quitting: 35.4   Smokeless tobacco: Never  Vaping Use   Vaping  status: Never Used  Substance and Sexual Activity   Alcohol use: No    Alcohol/week: 0.0 standard drinks of alcohol   Drug use: No   Sexual activity: Yes    Partners: Female    Birth control/protection: None  Other Topics Concern   Not on file  Social History Narrative   Not on file   Social Drivers of Health   Financial Resource Strain: Low Risk  (07/21/2023)   Overall Financial Resource Strain (CARDIA)    Difficulty of Paying Living Expenses: Not hard at all  Food Insecurity: No Food Insecurity (07/21/2023)   Hunger Vital Sign    Worried About Running Out of Food in the Last Year: Never true    Ran Out of Food in the Last Year: Never true  Transportation Needs: No Transportation Needs (07/21/2023)   PRAPARE - Administrator, Civil Service (Medical): No    Lack of Transportation (Non-Medical): No  Physical Activity: Sufficiently Active (03/20/2023)   Exercise Vital Sign    Days of Exercise per Week: 3 days    Minutes of Exercise per Session: 120 min  Stress: No Stress Concern Present (03/20/2023)   [employer] of Occupational Health -  Occupational Stress Questionnaire    Feeling of Stress : Only a little  Social Connections: Socially Integrated (03/20/2023)   Social Connection and Isolation Panel [NHANES]    Frequency of Communication with Friends and Family: More than three times a week    Frequency of Social Gatherings with Friends and Family: Three times a week    Attends Religious Services: 1 to 4 times per year    Active Member of Clubs or Organizations: No    Attends Engineer, structural: More than 4 times per year    Marital Status: Married  Catering manager Violence: Not At Risk (11/29/2022)   Humiliation, Afraid, Rape, and Kick questionnaire    Fear of Current or Ex-Partner: No    Emotionally Abused: No    Physically Abused: No    Sexually Abused: No     Constitutional: Pt reports frequent headaches. Denies fever, malaise, fatigue, or  abrupt weight changes.  HEENT: Pt reports eye redness. Denies eye pain, ear pain, ringing in the ears, wax buildup, runny nose, nasal congestion, bloody nose, or sore throat. Respiratory: Denies difficulty breathing, shortness of breath, cough or sputum production.   Cardiovascular: Denies chest pain, chest tightness, palpitations or swelling in the hands or feet.  Gastrointestinal: Denies abdominal pain, bloating, constipation, diarrhea or blood in the stool.  GU: Denies urgency, frequency, pain with urination, burning sensation, blood in urine, odor or discharge. Musculoskeletal: Patient reports left-sided weakness.  Denies decrease in range of motion, difficulty with gait, muscle pain or joint pain and swelling.  Skin: Denies redness, rashes, lesions or ulcercations.  Neurological: Pt reports difficulty with memory. Denies dizziness, difficulty with speech or problems with balance and coordination.  Psych: Denies anxiety, depression, SI/HI.  No other specific complaints in a complete review of systems (except as listed in HPI above).  Objective:   Physical Exam   BP 120/76   Pulse (!) 58   Resp 18   Wt 208 lb 11.2 oz (94.7 kg)   SpO2 99%   BMI 29.95 kg/m    Wt Readings from Last 3 Encounters:  03/24/23 207 lb 6.4 oz (94.1 kg)  11/29/22 209 lb (94.8 kg)  09/17/22 209 lb (94.8 kg)    General: Appears his stated age, overweight, in NAD. Skin: Warm, dry and intact.  HEENT: Head: normal shape and size; Eyes: sclera red, no icterus, conjunctiva pink, PERRLA and EOMs intact;  Neck:  Neck supple, trachea midline. No masses, lumps or thyromegaly present.  Cardiovascular: Bradycardic with normal rhythm. S1,S2 noted.  No murmur, rubs or gallops noted. No JVD or BLE edema. No carotid bruits noted. Pulmonary/Chest: Normal effort and positive vesicular breath sounds. No respiratory distress. No wheezes, rales or ronchi noted.  Abdomen: Normal bowel sounds.  Musculoskeletal: Strength 4/5  LUE/LLE, 5/5 RUE/RLE.  No difficulty with gait.  Neurological: Alert and oriented. Cranial nerves II-XII grossly intact. Coordination normal.  Psychiatric: Mood and affect normal. Behavior is normal. Judgment and thought content normal.    BMET    Component Value Date/Time   NA 138 03/28/2023 0921   K 4.0 03/28/2023 0921   CL 106 03/28/2023 0921   CO2 25 03/28/2023 0921   GLUCOSE 90 03/28/2023 0921   BUN 15 03/28/2023 0921   CREATININE 0.95 03/28/2023 0921   CALCIUM  9.1 03/28/2023 0921   GFRNONAA >60 12/19/2016 0406   GFRAA >60 12/19/2016 0406    Lipid Panel     Component Value Date/Time   CHOL 116 03/28/2023 0921  TRIG 38 03/28/2023 0921   HDL 61 03/28/2023 0921   CHOLHDL 1.9 03/28/2023 0921   VLDL 17.2 08/01/2020 1229   LDLCALC 44 03/28/2023 0921    CBC    Component Value Date/Time   WBC 2.3 (L) 03/28/2023 0921   RBC 5.08 03/28/2023 0921   HGB 15.4 03/28/2023 0921   HCT 46.6 03/28/2023 0921   PLT 182 03/28/2023 0921   MCV 91.7 03/28/2023 0921   MCH 30.3 03/28/2023 0921   MCHC 33.0 03/28/2023 0921   RDW 12.8 03/28/2023 0921   LYMPHSABS 1,158 09/12/2021 1007   MONOABS 0.2 06/20/2017 1605   EOSABS 122 09/12/2021 1007   BASOSABS 22 09/12/2021 1007    Hgb A1C Lab Results  Component Value Date   HGBA1C 5.7 (H) 03/28/2023           Assessment & Plan:   Preventative Health Maintenance:  Encouraged him to get a flu shot in fall He declines tetanus for financial reasons, advised him if he gets bit or cut to go get this done Encouraged him to get his COVID booster Pneumovax and Prevnar 13 UTD He declines Prevnar 20 today Zostavax and Shingrix UTD Colon screening UTD Encouraged him to consume a balanced diet and exercise regimen Advised him to see an eye doctor and dentist annually We will check CBC, c-Met, lipid, A1c and PSA today  RTC in 6 months, follow-up chronic conditions Helayne Lo, NP

## 2023-10-03 ENCOUNTER — Ambulatory Visit: Payer: Self-pay | Admitting: Internal Medicine

## 2023-10-03 LAB — COMPREHENSIVE METABOLIC PANEL WITH GFR
AG Ratio: 1.7 (calc) (ref 1.0–2.5)
ALT: 25 U/L (ref 9–46)
AST: 19 U/L (ref 10–35)
Albumin: 4.3 g/dL (ref 3.6–5.1)
Alkaline phosphatase (APISO): 60 U/L (ref 35–144)
BUN: 14 mg/dL (ref 7–25)
CO2: 28 mmol/L (ref 20–32)
Calcium: 9.2 mg/dL (ref 8.6–10.3)
Chloride: 105 mmol/L (ref 98–110)
Creat: 0.9 mg/dL (ref 0.70–1.28)
Globulin: 2.6 g/dL (ref 1.9–3.7)
Glucose, Bld: 92 mg/dL (ref 65–99)
Potassium: 4.2 mmol/L (ref 3.5–5.3)
Sodium: 139 mmol/L (ref 135–146)
Total Bilirubin: 1 mg/dL (ref 0.2–1.2)
Total Protein: 6.9 g/dL (ref 6.1–8.1)
eGFR: 89 mL/min/{1.73_m2} (ref >=60)

## 2023-10-03 LAB — HEMOGLOBIN A1C
Hgb A1c MFr Bld: 5.8 % — ABNORMAL HIGH (ref <5.7)
Mean Plasma Glucose: 120 mg/dL
eAG (mmol/L): 6.6 mmol/L

## 2023-10-03 LAB — LIPID PANEL
Cholesterol: 125 mg/dL (ref <200)
HDL: 62 mg/dL (ref >=40)
LDL Cholesterol (Calc): 49 mg/dL
Non-HDL Cholesterol (Calc): 63 mg/dL (ref <130)
Total CHOL/HDL Ratio: 2 (calc) (ref <5.0)
Triglycerides: 56 mg/dL (ref <150)

## 2023-10-03 LAB — CBC
HCT: 45.1 % (ref 38.5–50.0)
Hemoglobin: 14.7 g/dL (ref 13.2–17.1)
MCH: 30.3 pg (ref 27.0–33.0)
MCHC: 32.6 g/dL (ref 32.0–36.0)
MCV: 93 fL (ref 80.0–100.0)
MPV: 10.3 fL (ref 7.5–12.5)
Platelets: 192 10*3/uL (ref 140–400)
RBC: 4.85 10*6/uL (ref 4.20–5.80)
RDW: 13.1 % (ref 11.0–15.0)
WBC: 2.5 10*3/uL — ABNORMAL LOW (ref 3.8–10.8)

## 2023-10-03 LAB — PSA: PSA: 0.23 ng/mL (ref <=4.00)

## 2023-10-04 ENCOUNTER — Other Ambulatory Visit: Payer: Self-pay | Admitting: Internal Medicine

## 2023-10-04 DIAGNOSIS — I10 Essential (primary) hypertension: Secondary | ICD-10-CM

## 2023-10-06 NOTE — Telephone Encounter (Signed)
 Requested Prescriptions  Pending Prescriptions Disp Refills   amLODipine  (NORVASC ) 10 MG tablet [Pharmacy Med Name: AMLODIPINE  BESYLATE 10 MG TAB] 90 tablet 1    Sig: TAKE 1 TABLET BY MOUTH EVERY DAY     Cardiovascular: Calcium  Channel Blockers 2 Passed - 10/06/2023  1:56 PM      Passed - Last BP in normal range    BP Readings from Last 1 Encounters:  10/02/23 120/76         Passed - Last Heart Rate in normal range    Pulse Readings from Last 1 Encounters:  10/02/23 (!) 58         Passed - Valid encounter within last 6 months    Recent Outpatient Visits           4 days ago Encounter for general adult medical examination with abnormal findings   [facility], Rankin Buzzard, NP   2 months ago Night sweats   [facility] [place], Rankin Buzzard, [place]

## 2023-10-24 ENCOUNTER — Other Ambulatory Visit: Payer: Self-pay | Admitting: Internal Medicine

## 2023-10-24 DIAGNOSIS — I639 Cerebral infarction, unspecified: Secondary | ICD-10-CM

## 2023-10-27 NOTE — Telephone Encounter (Signed)
 Requested by interface surescripts. Last labs 10/02/23.  Requested Prescriptions  Pending Prescriptions Disp Refills   atorvastatin  (LIPITOR) 40 MG tablet [Pharmacy Med Name: ATORVASTATIN  40 MG TABLET] 90 tablet 1    Sig: TAKE 1 TABLET BY MOUTH EVERY DAY AT 6PM     Cardiovascular:  Antilipid - Statins Failed - 10/27/2023  3:30 PM      Failed - Lipid Panel in normal range within the last 12 months    Cholesterol  Date Value Ref Range Status  10/02/2023 125 <200 mg/dL Final   LDL Cholesterol (Calc)  Date Value Ref Range Status  10/02/2023 49 mg/dL (calc) Final    Comment:    Reference range: <100 . Desirable range <100 mg/dL for primary prevention;   <70 mg/dL for patients with CHD or diabetic patients  with > or = 2 CHD risk factors. SABRA LDL-C is now calculated using the Martin-Hopkins  calculation, which is a validated novel method providing  better accuracy than the Friedewald equation in the  estimation of LDL-C.  Gladis APPLETHWAITE et al. SANDREA. 7986;689(80): 2061-2068  (http://education.[facility].com/faq/FAQ164)    HDL  Date Value Ref Range Status  10/02/2023 62 > OR = 40 mg/dL Final   Triglycerides  Date Value Ref Range Status  10/02/2023 56 <150 mg/dL Final         Passed - Patient is not pregnant      Passed - Valid encounter within last 12 months    Recent Outpatient Visits           3 weeks ago Encounter for general adult medical examination with abnormal findings   [place] [facility] [place], Angeline ORN, NP   3 months ago Night sweats   [place] [facility] [place], Angeline ORN, [place]

## 2023-11-01 ENCOUNTER — Encounter (HOSPITAL_COMMUNITY): Payer: Self-pay | Admitting: Interventional Radiology

## 2023-12-01 ENCOUNTER — Encounter: Admitting: Internal Medicine

## 2023-12-05 ENCOUNTER — Ambulatory Visit: Payer: Medicare HMO

## 2023-12-05 DIAGNOSIS — Z0000 Encounter for general adult medical examination without abnormal findings: Secondary | ICD-10-CM

## 2023-12-05 NOTE — Patient Instructions (Signed)
 Jason Nolan , Thank you for taking time out of your busy schedule to complete your Annual Wellness Visit with me. I enjoyed our conversation and look forward to speaking with you again next year. I, as well as your care team,  appreciate your ongoing commitment to your health goals. Please review the following plan we discussed and let me know if I can assist you in the future.   Follow up Visits: 12/17/24 @ 8:10 AM BY PHONE We will see or speak with you next year for your Next Medicare AWV with our clinical staff Have you seen your provider in the last 6 months (3 months if uncontrolled diabetes)? Yes  Clinician Recommendations:  Aim for 30 minutes of exercise or brisk walking, 6-8 glasses of water, and 5 servings of fruits and vegetables each day. TAKE CARE!      This is a list of the screenings recommended for you:  Health Maintenance  Topic Date Due   DTaP/Tdap/Td vaccine (2 - Tdap) 05/06/2022   Flu Shot  12/05/2023   Medicare Annual Wellness Visit  12/04/2024   Colon Cancer Screening  07/01/2028   Pneumococcal Vaccine for age over 77  Completed   Hepatitis C Screening  Completed   Zoster (Shingles) Vaccine  Completed   Hepatitis B Vaccine  Aged Out   HPV Vaccine  Aged Out   Meningitis B Vaccine  Aged Out   COVID-19 Vaccine  Discontinued    Advanced directives: (ACP Link)Information on Advanced Care Planning can be found at [place]  Secretary of [facility] Advance Health Care Directives Advance Health Care Directives. [email or web address]  Advance Care Planning is important because it:  [x]  Makes sure you receive the medical care that is consistent with your values, goals, and preferences  [x]  It provides guidance to your family and loved ones and reduces their decisional burden about whether or not they are making the right decisions based on your wishes.  Follow the link provided in your after visit summary or read over the paperwork we have mailed to you to help you started getting your  Advance Directives in place. If you need assistance in completing these, please reach out to us  so that we can help you!

## 2023-12-05 NOTE — Progress Notes (Signed)
 Subjective:   Jason Nolan is a 78 y.o. who presents for a Medicare Wellness preventive visit.  As a reminder, Annual Wellness Visits don't include a physical exam, and some assessments may be limited, especially if this visit is performed virtually. We may recommend an in-person follow-up visit with your provider if needed.  Visit Complete: Virtual I connected with  Jason Nolan on 12/05/23 by a audio enabled telemedicine application and verified that I am speaking with the correct person using two identifiers.  Patient Location: Home  Provider Location: Home Office  I discussed the limitations of evaluation and management by telemedicine. The patient expressed understanding and agreed to proceed.  Vital Signs: Because this visit was a virtual/telehealth visit, some criteria may be missing or patient reported. Any vitals not documented were not able to be obtained and vitals that have been documented are patient reported.  VideoDeclined- This patient declined Librarian, academic. Therefore the visit was completed with audio only.  Persons Participating in Visit: Patient.  AWV Questionnaire: No: Patient Medicare AWV questionnaire was not completed prior to this visit.        Objective:    There were no vitals filed for this visit. There is no height or weight on file to calculate BMI.     12/05/2023   10:10 AM 11/29/2022   10:31 AM 11/23/2021    1:51 PM 07/01/2018   12:15 PM 10/13/2017   12:02 PM 07/04/2017   10:12 AM 06/27/2017    2:02 PM  Advanced Directives  Does Patient Have a Medical Advance Directive? No No No No  No  No  No   Would patient like information on creating a medical advance directive? No - Patient declined No - Patient declined No - Patient declined  No - Patient declined   Yes (ED - Information included in AVS)      Data saved with a previous flowsheet row definition    Current Medications (verified) Outpatient Encounter  Medications as of 12/05/2023  Medication Sig   amLODipine  (NORVASC ) 10 MG tablet TAKE 1 TABLET BY MOUTH EVERY DAY   aspirin  81 MG chewable tablet Chew 1 tablet (81 mg total) by mouth daily.   atorvastatin  (LIPITOR) 40 MG tablet TAKE 1 TABLET BY MOUTH EVERY DAY AT 6PM   brimonidine (ALPHAGAN) 0.2 % ophthalmic solution 1 drop 2 (two) times daily.   RHOPRESSA  0.02 % SOLN Apply to eye at bedtime. (Patient not taking: Reported on 12/05/2023)   scopolamine  (TRANSDERM-SCOP) 1 MG/3DAYS Place 1 patch (1.5 mg total) onto the skin every 3 (three) days. For motion sickness. Start 2 hr before onset symptoms, up to 12 hr before (Patient not taking: Reported on 12/05/2023)   VYZULTA  0.024 % SOLN Apply 1 drop to eye at bedtime.  (Patient not taking: Reported on 12/05/2023)   No facility-administered encounter medications on file as of 12/05/2023.    Allergies (verified) Patient has no known allergies.   History: Past Medical History:  Diagnosis Date   Glaucoma    Hepatitis C    Stroke [facility])    Past Surgical History:  Procedure Laterality Date   CATARACT EXTRACTION, BILATERAL  01/18/2019, 02/01/2019   COLONOSCOPY WITH PROPOFOL  N/A 07/01/2018   Procedure: COLONOSCOPY WITH PROPOFOL ;  Surgeon: Therisa Bi, MD;  Location: [facility] ENDOSCOPY;  Service: Gastroenterology;  Laterality: N/A;   IR ANGIO INTRA EXTRACRAN SEL COM CAROTID INNOMINATE UNI L MOD SED  11/25/2016   IR ANGIO VERTEBRAL SEL SUBCLAVIAN INNOMINATE UNI R  MOD SED  11/25/2016   IR ANGIO VERTEBRAL SEL VERTEBRAL UNI L MOD SED  11/25/2016   IR INTRAVSC STENT CERV CAROTID W/O EMB-PROT MOD SED INC ANGIO  11/25/2016   IR PERCUTANEOUS ART THROMBECTOMY/INFUSION INTRACRANIAL INC DIAG ANGIO  11/25/2016   IR RADIOLOGIST EVAL & MGMT  02/13/2017   RADIOLOGY WITH ANESTHESIA N/A 11/25/2016   Procedure: RADIOLOGY WITH ANESTHESIA;  Surgeon: Radiologist, Medication, MD;  Location: [facility] OR;  Service: Radiology;  Laterality: N/A;   Family History  Problem Relation Age of Onset    Hypertension Mother    Hypertension Father    Colon cancer Neg Hx    Prostate cancer Neg Hx    Social History   Socioeconomic History   Marital status: Married    Spouse name: Not on file   Number of children: Not on file   Years of education: Not on file   Highest education level: Some college, no degree  Occupational History   Not on file  Tobacco Use   Smoking status: Former    Current packs/day: 0.00    Average packs/day: 0.5 packs/day for 15.0 years (7.5 ttl pk-yrs)    Types: Cigarettes    Start date: 78    Quit date: 42    Years since quitting: 35.6   Smokeless tobacco: Never  Vaping Use   Vaping status: Never Used  Substance and Sexual Activity   Alcohol use: No    Alcohol/week: 0.0 standard drinks of alcohol   Drug use: No   Sexual activity: Yes    Partners: Female    Birth control/protection: None  Other Topics Concern   Not on file  Social History Narrative   Not on file   Social Drivers of Health   Financial Resource Strain: Low Risk  (12/05/2023)   Overall Financial Resource Strain (CARDIA)    Difficulty of Paying Living Expenses: Not hard at all  Food Insecurity: No Food Insecurity (12/05/2023)   Hunger Vital Sign    Worried About Running Out of Food in the Last Year: Never true    Ran Out of Food in the Last Year: Never true  Transportation Needs: No Transportation Needs (12/05/2023)   PRAPARE - Administrator, Civil Service (Medical): No    Lack of Transportation (Non-Medical): No  Physical Activity: Sufficiently Active (12/05/2023)   Exercise Vital Sign    Days of Exercise per Week: 4 days    Minutes of Exercise per Session: 90 min  Stress: No Stress Concern Present (12/05/2023)   [employer] of Occupational Health - Occupational Stress Questionnaire    Feeling of Stress: Not at all  Social Connections: Socially Integrated (12/05/2023)   Social Connection and Isolation Panel    Frequency of Communication with Friends and Family:  More than three times a week    Frequency of Social Gatherings with Friends and Family: Three times a week    Attends Religious Services: 1 to 4 times per year    Active Member of Clubs or Organizations: Yes    Attends Engineer, structural: More than 4 times per year    Marital Status: Married    Tobacco Counseling Counseling given: Not Answered    Clinical Intake:  Pre-visit preparation completed: Yes  Pain : No/denies pain     BMI - recorded: 29.8 Nutritional Status: BMI 25 -29 Overweight Nutritional Risks: None Diabetes: No  Lab Results  Component Value Date   HGBA1C 5.8 (H) 10/02/2023   HGBA1C 5.7 (  H) 03/28/2023   HGBA1C 5.8 (H) 09/17/2022     How often do you need to have someone help you when you read instructions, pamphlets, or other written materials from your doctor or pharmacy?: 1 - Never  Interpreter Needed?: No  Information entered by :: Jason DAS, Jason Nolan   Activities of Daily Living      No data to display          Patient Care Team: Antonette Angeline ORN, NP as PCP - General (Internal Medicine) Brien Sharper, MD as Referring Physician (Gastroenterology) Mittie Gaskin, MD as Referring Physician (Ophthalmology)  I have updated your Care Teams any recent Medical Services you may have received from other providers in the past year.     Assessment:   This is a routine wellness examination for Jason Nolan.  Hearing/Vision screen Hearing Screening - Comments:: NO AIDS Vision Screening - Comments:: NO GLASSES, HAD LENS IMPLANTS- GLAUCOMA- DR.BRASINGTON   Goals Addressed             This Visit's Progress    DIET - REDUCE SUGAR INTAKE         Depression Screen     12/05/2023   10:09 AM 03/24/2023    9:46 AM 11/29/2022   10:29 AM 09/17/2022   10:18 AM 11/23/2021    1:49 PM 09/12/2021   10:17 AM 08/01/2020   12:05 PM  PHQ 2/9 Scores  PHQ - 2 Score 0 0 0 0 0 0 0  PHQ- 9 Score 0  0  0 0 1    Fall Risk     12/05/2023   10:11 AM  03/24/2023    9:45 AM 11/29/2022   10:31 AM 11/27/2022    1:11 PM 09/17/2022   10:18 AM  Fall Risk   Falls in the past year? 0 0 0 0 0  Number falls in past yr: 0  0    Injury with Fall? 0 0 0  0  Risk for fall due to : No Fall Risks  No Fall Risks  No Fall Risks  Follow up Falls evaluation completed;Falls prevention discussed  Falls prevention discussed;Falls evaluation completed      MEDICARE RISK AT HOME:  Medicare Risk at Home Any stairs in or around the home?: No If so, are there any without handrails?: No Home free of loose throw rugs in walkways, pet beds, electrical cords, etc?: Yes Adequate lighting in your home to reduce risk of falls?: Yes Life alert?: No Use of a cane, walker or w/c?: No Grab bars in the bathroom?: Yes Shower chair or bench in shower?: Yes Elevated toilet seat or a handicapped toilet?: Yes  TIMED UP AND GO:  Was the test performed?  No  Cognitive Function: 6CIT completed        12/05/2023   10:13 AM 11/29/2022   10:33 AM  6CIT Screen  What Year? 0 points 0 points  What month? 0 points 0 points  What time? 0 points 0 points  Count back from 20 0 points 0 points  Months in reverse 0 points 0 points  Repeat phrase 0 points 2 points  Total Score 0 points 2 points    Immunizations Immunization History  Administered Date(s) Administered   Fluad Quad(high Dose 65+) 01/01/2019, 01/14/2022   Influenza, High Dose Seasonal PF 01/28/2018, 01/20/2020, 02/05/2021, 03/01/2023   Influenza-Unspecified 01/20/2020   PFIZER Comirnaty(Gray Top)Covid-19 Tri-Sucrose Vaccine 08/17/2020   PFIZER(Purple Top)SARS-COV-2 Vaccination 08/05/2019, 02/08/2020, 03/07/2020   PNEUMOCOCCAL CONJUGATE-20 03/24/2023  Research officer, trade union 33yrs & up 01/24/2021   Pfizer(Comirnaty)Fall Seasonal Vaccine 12 years and older 03/08/2022, 11/27/2023   Pneumococcal Conjugate-13 07/22/2013   Pneumococcal Polysaccharide-23 10/27/2014   Td 05/06/2012   Zoster  Recombinant(Shingrix) 09/11/2016, 12/24/2016   Zoster, Live 02/18/2014    Screening Tests Health Maintenance  Topic Date Due   DTaP/Tdap/Td (2 - Tdap) 05/06/2022   INFLUENZA VACCINE  12/05/2023   Medicare Annual Wellness (AWV)  12/04/2024   Colonoscopy  07/01/2028   Pneumococcal Vaccine: 50+ Years  Completed   Hepatitis C Screening  Completed   Zoster Vaccines- Shingrix  Completed   Hepatitis B Vaccines  Aged Out   HPV VACCINES  Aged Out   Meningococcal B Vaccine  Aged Out   COVID-19 Vaccine  Discontinued    Health Maintenance  Health Maintenance Due  Topic Date Due   DTaP/Tdap/Td (2 - Tdap) 05/06/2022   INFLUENZA VACCINE  12/05/2023   Health Maintenance Items Addressed: AGED OUT OF COLONOSCOPY; UP TO DATE ON ALL SHOTS  Additional Screening:  Vision Screening: Recommended annual ophthalmology exams for early detection of glaucoma and other disorders of the eye. Would you like a referral to an eye doctor? No    Dental Screening: Recommended annual dental exams for proper oral hygiene  Community Resource Referral / Chronic Care Management: CRR required this visit?  No   CCM required this visit?  No   Plan:    I have personally reviewed and noted the following in the patient's chart:   Medical and social history Use of alcohol, tobacco or illicit drugs  Current medications and supplements including opioid prescriptions. Patient is not currently taking opioid prescriptions. Functional ability and status Nutritional status Physical activity Advanced directives List of other physicians Hospitalizations, surgeries, and ER visits in previous 12 months Vitals Screenings to include cognitive, depression, and falls Referrals and appointments  In addition, I have reviewed and discussed with patient certain preventive protocols, quality metrics, and best practice recommendations. A written personalized care plan for preventive services as well as general preventive  health recommendations were provided to patient.   Jason GORMAN Das, Jason Nolan   05/13/7972   After Visit Summary: (MyChart) Due to this being a telephonic visit, the after visit summary with patients personalized plan was offered to patient via MyChart   Notes: Nothing significant to report at this time.

## 2024-01-16 ENCOUNTER — Other Ambulatory Visit: Payer: Self-pay | Admitting: Internal Medicine

## 2024-01-16 DIAGNOSIS — I10 Essential (primary) hypertension: Secondary | ICD-10-CM

## 2024-01-19 NOTE — Telephone Encounter (Signed)
 Rx 10/06/23 #90 1RF- too soon Requested Prescriptions  Pending Prescriptions Disp Refills   amLODipine  (NORVASC ) 10 MG tablet [Pharmacy Med Name: AMLODIPINE  BESYLATE 10 MG TAB] 90 tablet 1    Sig: TAKE 1 TABLET BY MOUTH EVERY DAY     Cardiovascular: Calcium  Channel Blockers 2 Passed - 01/19/2024 12:05 PM      Passed - Last BP in normal range    BP Readings from Last 1 Encounters:  10/02/23 120/76         Passed - Last Heart Rate in normal range    Pulse Readings from Last 1 Encounters:  10/02/23 (!) 58         Passed - Valid encounter within last 6 months    Recent Outpatient Visits           3 months ago Encounter for general adult medical examination with abnormal findings   [place] [facility] [place], Angeline ORN, NP   6 months ago Night sweats   [place] [facility] [place], Angeline ORN, [place]

## 2024-04-14 ENCOUNTER — Encounter: Payer: Self-pay | Admitting: Internal Medicine

## 2024-04-14 DIAGNOSIS — I10 Essential (primary) hypertension: Secondary | ICD-10-CM

## 2024-04-14 MED ORDER — AMLODIPINE BESYLATE 10 MG PO TABS: 10.0000 mg | ORAL_TABLET | Freq: Every day | ORAL | 1 refills | Status: AC

## 2024-04-25 ENCOUNTER — Other Ambulatory Visit: Payer: Self-pay | Admitting: Internal Medicine

## 2024-04-25 DIAGNOSIS — I639 Cerebral infarction, unspecified: Secondary | ICD-10-CM

## 2024-04-28 NOTE — Telephone Encounter (Signed)
 Requested Prescriptions  Pending Prescriptions Disp Refills   atorvastatin  (LIPITOR) 40 MG tablet [Pharmacy Med Name: ATORVASTATIN  40 MG TABLET] 90 tablet 1    Sig: TAKE 1 TABLET BY MOUTH EVERY DAY AT 6PM     Cardiovascular:  Antilipid - Statins Failed - 04/28/2024 10:26 AM      Failed - Lipid Panel in normal range within the last 12 months    Cholesterol  Date Value Ref Range Status  10/02/2023 125 <200 mg/dL Final   LDL Cholesterol (Calc)  Date Value Ref Range Status  10/02/2023 49 mg/dL (calc) Final    Comment:    Reference range: <100 . Desirable range <100 mg/dL for primary prevention;   <70 mg/dL for patients with CHD or diabetic patients  with > or = 2 CHD risk factors. SABRA LDL-C is now calculated using the Martin-Hopkins  calculation, which is a validated novel method providing  better accuracy than the Friedewald equation in the  estimation of LDL-C.  Gladis APPLETHWAITE et al. SANDREA. 7986;689(80): 2061-2068  (http://education.[employer].com/faq/FAQ164)    HDL  Date Value Ref Range Status  10/02/2023 62 > OR = 40 mg/dL Final   Triglycerides  Date Value Ref Range Status  10/02/2023 56 <150 mg/dL Final         Passed - Patient is not pregnant      Passed - Valid encounter within last 12 months    Recent Outpatient Visits           6 months ago Encounter for general adult medical examination with abnormal findings   [place] [facility] [place], Angeline ORN, NP   9 months ago Night sweats   [facility] [place], Angeline ORN, [place]

## 2024-05-11 ENCOUNTER — Ambulatory Visit (INDEPENDENT_AMBULATORY_CARE_PROVIDER_SITE_OTHER): Admitting: Internal Medicine

## 2024-05-11 ENCOUNTER — Encounter: Payer: Self-pay | Admitting: Internal Medicine

## 2024-05-11 VITALS — BP 120/70 | Ht 70.0 in | Wt 213.0 lb

## 2024-05-11 DIAGNOSIS — Z8673 Personal history of transient ischemic attack (TIA), and cerebral infarction without residual deficits: Secondary | ICD-10-CM | POA: Diagnosis not present

## 2024-05-11 DIAGNOSIS — I10 Essential (primary) hypertension: Secondary | ICD-10-CM | POA: Diagnosis not present

## 2024-05-11 DIAGNOSIS — G8194 Hemiplegia, unspecified affecting left nondominant side: Secondary | ICD-10-CM | POA: Diagnosis not present

## 2024-05-11 DIAGNOSIS — R7303 Prediabetes: Secondary | ICD-10-CM | POA: Diagnosis not present

## 2024-05-11 DIAGNOSIS — G8929 Other chronic pain: Secondary | ICD-10-CM | POA: Diagnosis not present

## 2024-05-11 DIAGNOSIS — D700 Congenital agranulocytosis: Secondary | ICD-10-CM

## 2024-05-11 DIAGNOSIS — E66811 Obesity, class 1: Secondary | ICD-10-CM

## 2024-05-11 DIAGNOSIS — E6609 Other obesity due to excess calories: Secondary | ICD-10-CM

## 2024-05-11 DIAGNOSIS — E7800 Pure hypercholesterolemia, unspecified: Secondary | ICD-10-CM

## 2024-05-11 DIAGNOSIS — H409 Unspecified glaucoma: Secondary | ICD-10-CM | POA: Diagnosis not present

## 2024-05-11 DIAGNOSIS — Z6830 Body mass index (BMI) 30.0-30.9, adult: Secondary | ICD-10-CM | POA: Diagnosis not present

## 2024-05-11 DIAGNOSIS — M25512 Pain in left shoulder: Secondary | ICD-10-CM

## 2024-05-11 NOTE — Assessment & Plan Note (Signed)
 Stable Discussed the importance of good blood pressure, cholesterol and blood sugar control Continue aspirin  81 mg, atorvastatin  40 mg and amlodipine  10 mg daily

## 2024-05-11 NOTE — Assessment & Plan Note (Signed)
 Complicated by obesity A1c today Encourage low-carb diet and exercise for weight loss

## 2024-05-11 NOTE — Assessment & Plan Note (Signed)
 Complicated by obesity C-Met and lipid profile today Encouraged him to get him a low-fat diet Continue atorvastatin  40 mg daily

## 2024-05-11 NOTE — Assessment & Plan Note (Signed)
 Continue timolol 0.024% solution at bedtime per ophthalmology

## 2024-05-11 NOTE — Patient Instructions (Signed)
 Hypertension, Adult Hypertension is another name for high blood pressure. High blood pressure forces your heart to work harder to pump blood. This can cause problems over time. There are two numbers in a blood pressure reading. There is a top number (systolic) over a bottom number (diastolic). It is best to have a blood pressure that is below 120/80. What are the causes? The cause of this condition is not known. Some other conditions can lead to high blood pressure. What increases the risk? Some lifestyle factors can make you more likely to develop high blood pressure: Smoking. Not getting enough exercise or physical activity. Being overweight. Having too much fat, sugar, calories, or salt (sodium) in your diet. Drinking too much alcohol. Other risk factors include: Having any of these conditions: Heart disease. Diabetes. High cholesterol. Kidney disease. Obstructive sleep apnea. Having a family history of high blood pressure and high cholesterol. Age. The risk increases with age. Stress. What are the signs or symptoms? High blood pressure may not cause symptoms. Very high blood pressure (hypertensive crisis) may cause: Headache. Fast or uneven heartbeats (palpitations). Shortness of breath. Nosebleed. Vomiting or feeling like you may vomit (nauseous). Changes in how you see. Very bad chest pain. Feeling dizzy. Seizures. How is this treated? This condition is treated by making healthy lifestyle changes, such as: Eating healthy foods. Exercising more. Drinking less alcohol. Your doctor may prescribe medicine if lifestyle changes do not help enough and if: Your top number is above 130. Your bottom number is above 80. Your personal target blood pressure may vary. Follow these instructions at home: Eating and drinking  If told, follow the DASH eating plan. To follow this plan: Fill one half of your plate at each meal with fruits and vegetables. Fill one fourth of your plate  at each meal with whole grains. Whole grains include whole-wheat pasta, brown rice, and whole-grain bread. Eat or drink low-fat dairy products, such as skim milk or low-fat yogurt. Fill one fourth of your plate at each meal with low-fat (lean) proteins. Low-fat proteins include fish, chicken without skin, eggs, beans, and tofu. Avoid fatty meat, cured and processed meat, or chicken with skin. Avoid pre-made or processed food. Limit the amount of salt in your diet to less than 1,500 mg each day. Do not drink alcohol if: Your doctor tells you not to drink. You are pregnant, may be pregnant, or are planning to become pregnant. If you drink alcohol: Limit how much you have to: 0-1 drink a day for women. 0-2 drinks a day for men. Know how much alcohol is in your drink. In the [place] one drink equals one 12 oz bottle of beer (355 mL), one 5 oz glass of wine (148 mL), or one 1 oz glass of hard liquor (44 mL). Lifestyle  Work with your doctor to stay at a healthy weight or to lose weight. Ask your doctor what the best weight is for you. Get at least 30 minutes of exercise that causes your heart to beat faster (aerobic exercise) most days of the week. This may include walking, swimming, or biking. Get at least 30 minutes of exercise that strengthens your muscles (resistance exercise) at least 3 days a week. This may include lifting weights or doing Pilates. Do not smoke or use any products that contain nicotine or tobacco. If you need help quitting, ask your doctor. Check your blood pressure at home as told by your doctor. Keep all follow-up visits. Medicines Take over-the-counter and prescription medicines  only as told by your doctor. Follow directions carefully. Do not skip doses of blood pressure medicine. The medicine does not work as well if you skip doses. Skipping doses also puts you at risk for problems. Ask your doctor about side effects or reactions to medicines that you should watch  for. Contact a doctor if: You think you are having a reaction to the medicine you are taking. You have headaches that keep coming back. You feel dizzy. You have swelling in your ankles. You have trouble with your vision. Get help right away if: You get a very bad headache. You start to feel mixed up (confused). You feel weak or numb. You feel faint. You have very bad pain in your: Chest. Belly (abdomen). You vomit more than once. You have trouble breathing. These symptoms may be an emergency. Get help right away. Call 911. Do not wait to see if the symptoms will go away. Do not drive yourself to the hospital. Summary Hypertension is another name for high blood pressure. High blood pressure forces your heart to work harder to pump blood. For most people, a normal blood pressure is less than 120/80. Making healthy choices can help lower blood pressure. If your blood pressure does not get lower with healthy choices, you may need to take medicine. This information is not intended to replace advice given to you by your health care provider. Make sure you discuss any questions you have with your health care provider. Document Revised: 02/08/2021 Document Reviewed: 02/08/2021 Elsevier Patient Education  2024 [employer].

## 2024-05-11 NOTE — Progress Notes (Signed)
 "  Subjective:    Patient ID: Jason Nolan, male    DOB: 01-Aug-1945, 79 y.o.   MRN: [id number]  HPI  Patient presents to clinic today for 34-month follow-up of chronic conditions.  HTN: His BP today is 120/70.  He is taking amlodipine  as prescribed.  ECG from 11/2016 reviewed.  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  His last LDL was 49, triglycerides 56, 09/2023.  He denies myalgias on atorvastatin .  He is taking aspirin  as well.  He does not follow with neurology.  Neutropenia: His last WBC count was 2.5, 09/2023.  He has seen hematology in the past.  Prediabetes: His last A1c was 5.8%, 09/2023.  He is not taking any oral diabetic medication at this time.  He does not check his sugars.  Chronic left shoulder pain: He is not taking any medication for this. Xray left shoulder from 03/2023. He does not follow with orthopedics.  Glaucoma: He reports intermittent blurred vision. He is taking timolol as prescribed. He follows with ophthalmology.  Review of Systems     Past Medical History:  Diagnosis Date   Glaucoma    Hepatitis C    Stroke [facility])     Current Outpatient Medications  Medication Sig Dispense Refill   amLODipine  (NORVASC ) 10 MG tablet Take 1 tablet (10 mg total) by mouth daily. 90 tablet 1   aspirin  81 MG chewable tablet Chew 1 tablet (81 mg total) by mouth daily.     atorvastatin  (LIPITOR) 40 MG tablet TAKE 1 TABLET BY MOUTH EVERY DAY AT 6PM 90 tablet 1   brimonidine (ALPHAGAN) 0.2 % ophthalmic solution 1 drop 2 (two) times daily.     RHOPRESSA 0.02 % SOLN Apply to eye at bedtime. (Patient not taking: Reported on 12/05/2023)     scopolamine  (TRANSDERM-SCOP) 1 MG/3DAYS Place 1 patch (1.5 mg total) onto the skin every 3 (three) days. For motion sickness. Start 2 hr before onset symptoms, up to 12 hr before (Patient not taking: Reported on 12/05/2023) 8 patch 0   VYZULTA 0.024 % SOLN Apply 1 drop to eye at bedtime.  (Patient not taking: Reported on 12/05/2023)      No current facility-administered medications for this visit.    No Known Allergies  Family History  Problem Relation Age of Onset   Hypertension Mother    Hypertension Father    Colon cancer Neg Hx    Prostate cancer Neg Hx     Social History   Socioeconomic History   Marital status: Married    Spouse name: Not on file   Number of children: Not on file   Years of education: Not on file   Highest education level: Some college, no degree  Occupational History   Not on file  Tobacco Use   Smoking status: Former    Current packs/day: 0.00    Average packs/day: 0.5 packs/day for 15.0 years (7.5 ttl pk-yrs)    Types: Cigarettes    Start date: 50    Quit date: 63    Years since quitting: 36.0   Smokeless tobacco: Never  Vaping Use   Vaping status: Never Used  Substance and Sexual Activity   Alcohol use: No    Alcohol/week: 0.0 standard drinks of alcohol   Drug use: No   Sexual activity: Yes    Partners: Female    Birth control/protection: None  Other Topics Concern   Not on file  Social History Narrative   Not on file  Social Drivers of Health   Tobacco Use: Medium Risk (12/05/2023)   Patient History    Smoking Tobacco Use: Former    Smokeless Tobacco Use: Never    Passive Exposure: Not on file  Financial Resource Strain: Low Risk (05/10/2024)   Overall Financial Resource Strain (CARDIA)    Difficulty of Paying Living Expenses: Not hard at all  Food Insecurity: No Food Insecurity (05/10/2024)   Epic    Worried About Programme Researcher, Broadcasting/film/video in the Last Year: Never true    Ran Out of Food in the Last Year: Never true  Transportation Needs: No Transportation Needs (05/10/2024)   Epic    Lack of Transportation (Medical): No    Lack of Transportation (Non-Medical): No  Physical Activity: Sufficiently Active (05/10/2024)   Exercise Vital Sign    Days of Exercise per Week: 4 days    Minutes of Exercise per Session: 60 min  Stress: No Stress Concern Present (05/10/2024)    [employer] of Occupational Health - Occupational Stress Questionnaire    Feeling of Stress: Only a little  Social Connections: Socially Integrated (05/10/2024)   Social Connection and Isolation Panel    Frequency of Communication with Friends and Family: More than three times a week    Frequency of Social Gatherings with Friends and Family: Three times a week    Attends Religious Services: 1 to 4 times per year    Active Member of Clubs or Organizations: Yes    Attends Banker Meetings: More than 4 times per year    Marital Status: Married  Catering Manager Violence: Not At Risk (12/05/2023)   Epic    Fear of Current or Ex-Partner: No    Emotionally Abused: No    Physically Abused: No    Sexually Abused: No  Depression (PHQ2-9): Low Risk (12/05/2023)   Depression (PHQ2-9)    PHQ-2 Score: 0  Alcohol Screen: Low Risk (12/05/2023)   Alcohol Screen    Last Alcohol Screening Score (AUDIT): 0  Housing: Unknown (05/10/2024)   Epic    Unable to Pay for Housing in the Last Year: No    Number of Times Moved in the Last Year: Not on file    Homeless in the Last Year: No  Utilities: Not At Risk (12/05/2023)   Epic    Threatened with loss of utilities: No  Health Literacy: Adequate Health Literacy (12/05/2023)   B1300 Health Literacy    Frequency of need for help with medical instructions: Never     Constitutional: Denies fever, malaise, fatigue, headache or abrupt weight changes.  HEENT: Patient reports intermittent blurred vision.  Denies eye pain, ear pain, ringing in the ears, runny nose, nasal congestion, bloody nose, or sore throat. Respiratory: Denies difficulty breathing, shortness of breath, cough or sputum production.   Cardiovascular: Patient reports intermittent swelling in left lower extremity.  Denies chest pain, chest tightness, palpitations or swelling in the hands.  Gastrointestinal: Denies abdominal pain, bloating, constipation, diarrhea or blood in the stool.   GU: Denies urgency, frequency, pain with urination, burning sensation, blood in urine, odor or discharge. Musculoskeletal: Patient reports chronic left shoulder pain.  Denies decrease in range of motion, difficulty with gait, muscle pain or joint swelling.  Skin: Denies redness, rashes, lesions or ulcercations.  Neurological: Denies dizziness, difficulty with memory, difficulty with speech or problems with balance and coordination.  Psych: Denies anxiety, depression, SI/HI.  No other specific complaints in a complete review of systems (except as listed  in HPI above).  Objective:   Physical Exam BP 120/70 (BP Location: Right Arm, Patient Position: Sitting, Cuff Size: Large)   Ht 5' 10 (1.778 m)   Wt 213 lb (96.6 kg)   BMI 30.56 kg/m     Wt Readings from Last 3 Encounters:  10/02/23 208 lb 11.2 oz (94.7 kg)  03/24/23 207 lb 6.4 oz (94.1 kg)  11/29/22 209 lb (94.8 kg)    General: Appears his stated age, obese, in NAD. Skin: Warm, dry and intact.  HEENT: Head: normal shape and size; Eyes: sclera injected, no icterus, conjunctiva pink, PERRLA and EOMs intact; Ears: Cerumen impaction right ear;  Cardiovascular: Bradycardic with normal rhythm. S1,S2 noted.  No murmur, rubs or gallops noted. No JVD or BLE edema. No carotid bruits noted. Pulmonary/Chest: Normal effort and positive vesicular breath sounds. No respiratory distress. No wheezes, rales or ronchi noted.  Musculoskeletal: Decreased external rotation of the left shoulder.  Normal internal rotation of left shoulder.  Negative drop can test on the left.  Crepitus noted with range of motion of the left shoulder.  No pain with palpation of the left shoulder.  Strength 5/5 RUE, 4/5 LUE.  Handgrips equal.  No difficulty with gait.  Neurological: Alert and oriented.  oordination normal.     BMET    Component Value Date/Time   NA 139 10/02/2023 1010   K 4.2 10/02/2023 1010   CL 105 10/02/2023 1010   CO2 28 10/02/2023 1010    GLUCOSE 92 10/02/2023 1010   BUN 14 10/02/2023 1010   CREATININE 0.90 10/02/2023 1010   CALCIUM  9.2 10/02/2023 1010   GFRNONAA >60 12/19/2016 0406   GFRAA >60 12/19/2016 0406    Lipid Panel     Component Value Date/Time   CHOL 125 10/02/2023 1010   TRIG 56 10/02/2023 1010   HDL 62 10/02/2023 1010   CHOLHDL 2.0 10/02/2023 1010   VLDL 17.2 08/01/2020 1229   LDLCALC 49 10/02/2023 1010    CBC    Component Value Date/Time   WBC 2.5 (L) 10/02/2023 1010   RBC 4.85 10/02/2023 1010   HGB 14.7 10/02/2023 1010   HCT 45.1 10/02/2023 1010   PLT 192 10/02/2023 1010   MCV 93.0 10/02/2023 1010   MCH 30.3 10/02/2023 1010   MCHC 32.6 10/02/2023 1010   RDW 13.1 10/02/2023 1010   LYMPHSABS 1,158 09/12/2021 1007   MONOABS 0.2 06/20/2017 1605   EOSABS 122 09/12/2021 1007   BASOSABS 22 09/12/2021 1007    Hgb A1C Lab Results  Component Value Date   HGBA1C 5.8 (H) 10/02/2023            Assessment & Plan:      RTC in 6 months, for your annual exam Angeline Laura, NP   "

## 2024-05-11 NOTE — Assessment & Plan Note (Signed)
 Stable Discussed the importance of good blood pressure, cholesterol and blood sugar control Continue aspirin  81 mg daily

## 2024-05-11 NOTE — Assessment & Plan Note (Signed)
 He declines Rx for anti-inflammatories or steroids at this time He declines referral for physical therapy or MRI at this time

## 2024-05-11 NOTE — Assessment & Plan Note (Signed)
 Complicated by obesity Controlled on amlodipine  10 mg daily Reinforced DASH diet and exercise for weight loss C-Met today

## 2024-05-11 NOTE — Assessment & Plan Note (Signed)
 Encourage diet and exercise for weight loss

## 2024-05-11 NOTE — Assessment & Plan Note (Signed)
CBC with differential today

## 2024-05-12 LAB — CBC WITH DIFFERENTIAL/PLATELET
Absolute Lymphocytes: 1240 {cells}/uL (ref 850–3900)
Absolute Monocytes: 216 {cells}/uL (ref 200–950)
Basophils Absolute: 29 {cells}/uL (ref 0–200)
Basophils Relative: 1.1 %
Eosinophils Absolute: 138 {cells}/uL (ref 15–500)
Eosinophils Relative: 5.3 %
HCT: 46.3 % (ref 39.4–51.1)
Hemoglobin: 14.8 g/dL (ref 13.2–17.1)
MCH: 29.7 pg (ref 27.0–33.0)
MCHC: 32 g/dL (ref 31.6–35.4)
MCV: 93 fL (ref 81.4–101.7)
MPV: 10.2 fL (ref 7.5–12.5)
Monocytes Relative: 8.3 %
Neutro Abs: 978 {cells}/uL — ABNORMAL LOW (ref 1500–7800)
Neutrophils Relative %: 37.6 %
Platelets: 200 Thousand/uL (ref 140–400)
RBC: 4.98 Million/uL (ref 4.20–5.80)
RDW: 13.1 % (ref 11.0–15.0)
Total Lymphocyte: 47.7 %
WBC: 2.6 Thousand/uL — ABNORMAL LOW (ref 3.8–10.8)

## 2024-05-12 LAB — LIPID PANEL
Cholesterol: 122 mg/dL
HDL: 67 mg/dL
LDL Cholesterol (Calc): 41 mg/dL
Non-HDL Cholesterol (Calc): 55 mg/dL
Total CHOL/HDL Ratio: 1.8 (calc)
Triglycerides: 59 mg/dL

## 2024-05-12 LAB — COMPREHENSIVE METABOLIC PANEL WITH GFR
AG Ratio: 1.7 (calc) (ref 1.0–2.5)
ALT: 27 U/L (ref 9–46)
AST: 23 U/L (ref 10–35)
Albumin: 4.4 g/dL (ref 3.6–5.1)
Alkaline phosphatase (APISO): 60 U/L (ref 35–144)
BUN: 12 mg/dL (ref 7–25)
CO2: 25 mmol/L (ref 20–32)
Calcium: 9 mg/dL (ref 8.6–10.3)
Chloride: 105 mmol/L (ref 98–110)
Creat: 0.94 mg/dL (ref 0.70–1.28)
Globulin: 2.6 g/dL (ref 1.9–3.7)
Glucose, Bld: 94 mg/dL (ref 65–99)
Potassium: 4.2 mmol/L (ref 3.5–5.3)
Sodium: 139 mmol/L (ref 135–146)
Total Bilirubin: 0.6 mg/dL (ref 0.2–1.2)
Total Protein: 7 g/dL (ref 6.1–8.1)
eGFR: 84 mL/min/1.73m2

## 2024-05-12 LAB — HEMOGLOBIN A1C
Hgb A1c MFr Bld: 5.8 % — ABNORMAL HIGH
Mean Plasma Glucose: 120 mg/dL
eAG (mmol/L): 6.6 mmol/L

## 2024-05-13 ENCOUNTER — Ambulatory Visit: Payer: Self-pay | Admitting: Internal Medicine

## 2024-10-07 ENCOUNTER — Encounter: Admitting: Internal Medicine

## 2024-12-17 ENCOUNTER — Ambulatory Visit

## 2024-12-22 ENCOUNTER — Ambulatory Visit
